# Patient Record
Sex: Male | Born: 1945 | Race: White | Hispanic: No | Smoking: Never smoker
Health system: Southern US, Community
[De-identification: ages and names within clinical notes are randomized; demographics above are authoritative.]

## PROBLEM LIST (undated history)

## (undated) DIAGNOSIS — I214 Non-ST elevation (NSTEMI) myocardial infarction: Secondary | ICD-10-CM

## (undated) DIAGNOSIS — R972 Elevated prostate specific antigen [PSA]: Secondary | ICD-10-CM

## (undated) DIAGNOSIS — N529 Male erectile dysfunction, unspecified: Secondary | ICD-10-CM

## (undated) DIAGNOSIS — F329 Major depressive disorder, single episode, unspecified: Secondary | ICD-10-CM

## (undated) DIAGNOSIS — C189 Malignant neoplasm of colon, unspecified: Secondary | ICD-10-CM

## (undated) DIAGNOSIS — I447 Left bundle-branch block, unspecified: Secondary | ICD-10-CM

## (undated) DIAGNOSIS — E7800 Pure hypercholesterolemia, unspecified: Secondary | ICD-10-CM

## (undated) DIAGNOSIS — G20A1 Parkinson's disease without dyskinesia, without mention of fluctuations: Secondary | ICD-10-CM

## (undated) DIAGNOSIS — M7061 Trochanteric bursitis, right hip: Secondary | ICD-10-CM

## (undated) DIAGNOSIS — M1990 Unspecified osteoarthritis, unspecified site: Secondary | ICD-10-CM

## (undated) DIAGNOSIS — N200 Calculus of kidney: Secondary | ICD-10-CM

## (undated) DIAGNOSIS — E785 Hyperlipidemia, unspecified: Secondary | ICD-10-CM

## (undated) DIAGNOSIS — G459 Transient cerebral ischemic attack, unspecified: Secondary | ICD-10-CM

## (undated) DIAGNOSIS — K219 Gastro-esophageal reflux disease without esophagitis: Secondary | ICD-10-CM

## (undated) DIAGNOSIS — I2510 Atherosclerotic heart disease of native coronary artery without angina pectoris: Secondary | ICD-10-CM

## (undated) DIAGNOSIS — G250 Essential tremor: Secondary | ICD-10-CM

## (undated) DIAGNOSIS — I10 Essential (primary) hypertension: Secondary | ICD-10-CM

## (undated) DIAGNOSIS — Z951 Presence of aortocoronary bypass graft: Secondary | ICD-10-CM

## (undated) DIAGNOSIS — K449 Diaphragmatic hernia without obstruction or gangrene: Secondary | ICD-10-CM

## (undated) DIAGNOSIS — G20 Parkinson's disease: Secondary | ICD-10-CM

## (undated) DIAGNOSIS — Z7982 Long term (current) use of aspirin: Secondary | ICD-10-CM

## (undated) DIAGNOSIS — E8881 Metabolic syndrome: Secondary | ICD-10-CM

## (undated) DIAGNOSIS — R51 Headache: Secondary | ICD-10-CM

## (undated) DIAGNOSIS — S83249A Other tear of medial meniscus, current injury, unspecified knee, initial encounter: Secondary | ICD-10-CM

## (undated) DIAGNOSIS — N400 Enlarged prostate without lower urinary tract symptoms: Secondary | ICD-10-CM

## (undated) HISTORY — DX: Left bundle-branch block, unspecified: I44.7

## (undated) HISTORY — DX: Calculus of kidney: N20.0

## (undated) HISTORY — DX: Presence of aortocoronary bypass graft: Z95.1

## (undated) HISTORY — DX: Elevated prostate specific antigen (PSA): R97.20

## (undated) HISTORY — DX: Transient cerebral ischemic attack, unspecified: G45.9

## (undated) HISTORY — PX: CORONARY ARTERY BYPASS GRAFT: SHX141

## (undated) HISTORY — DX: Essential (primary) hypertension: I10

## (undated) HISTORY — DX: Metabolic syndrome: E88.81

## (undated) HISTORY — PX: COLON SURGERY: SHX602

## (undated) HISTORY — DX: Atherosclerotic heart disease of native coronary artery without angina pectoris: I25.10

## (undated) HISTORY — DX: Long term (current) use of aspirin: Z79.82

## (undated) HISTORY — DX: Unspecified osteoarthritis, unspecified site: M19.90

## (undated) HISTORY — PX: URETHRAL DILATION: SUR417

## (undated) HISTORY — DX: Gastro-esophageal reflux disease without esophagitis: K21.9

## (undated) HISTORY — DX: Parkinson's disease without dyskinesia, without mention of fluctuations: G20.A1

## (undated) HISTORY — DX: Hyperlipidemia, unspecified: E78.5

## (undated) HISTORY — PX: CARDIAC CATHETERIZATION: SHX172

## (undated) HISTORY — DX: Morbid (severe) obesity due to excess calories: E66.01

## (undated) HISTORY — DX: Benign prostatic hyperplasia without lower urinary tract symptoms: N40.0

## (undated) HISTORY — DX: Metabolic syndrome: E88.810

## (undated) HISTORY — DX: Diaphragmatic hernia without obstruction or gangrene: K44.9

## (undated) HISTORY — DX: Headache: R51

## (undated) HISTORY — DX: Pure hypercholesterolemia, unspecified: E78.00

## (undated) HISTORY — DX: Parkinson's disease: G20

## (undated) HISTORY — DX: Trochanteric bursitis, right hip: M70.61

## (undated) HISTORY — DX: Essential tremor: G25.0

## (undated) HISTORY — PX: LITHOTRIPSY: SUR834

## (undated) HISTORY — DX: Non-ST elevation (NSTEMI) myocardial infarction: I21.4

## (undated) HISTORY — DX: Male erectile dysfunction, unspecified: N52.9

## (undated) HISTORY — DX: Malignant neoplasm of colon, unspecified: C18.9

## (undated) HISTORY — DX: Major depressive disorder, single episode, unspecified: F32.9

## (undated) HISTORY — PX: TRANSURETHRAL RESECTION OF PROSTATE: SHX73

---

## 2009-09-02 ENCOUNTER — Encounter: Admission: RE | Admit: 2009-09-02 | Discharge: 2009-09-02 | Payer: Self-pay | Admitting: Surgery

## 2009-12-27 ENCOUNTER — Emergency Department (HOSPITAL_COMMUNITY): Admission: EM | Admit: 2009-12-27 | Discharge: 2009-12-27 | Payer: Self-pay | Admitting: Emergency Medicine

## 2010-05-09 ENCOUNTER — Encounter: Payer: Self-pay | Admitting: Internal Medicine

## 2010-05-09 ENCOUNTER — Observation Stay (HOSPITAL_COMMUNITY): Admission: EM | Admit: 2010-05-09 | Discharge: 2010-05-10 | Payer: Self-pay | Admitting: Emergency Medicine

## 2010-05-09 LAB — CONVERTED CEMR LAB
ALT: 61 units/L
AST: 45 units/L
Albumin: 4 g/dL
Alkaline Phosphatase: 58 units/L
BUN: 14 mg/dL
Bilirubin Urine: NEGATIVE
CO2: 26 meq/L
Calcium: 9.2 mg/dL
Chloride: 105 meq/L
Cholesterol: 174 mg/dL
Creatinine, Ser: 1.08 mg/dL
Glucose, Urine, Semiquant: NEGATIVE
HCT: 45.5 %
HDL: 35 mg/dL
Hemoglobin: 16 g/dL
Hgb urine dipstick: NEGATIVE
Ketones, urine, test strip: 15
LDL Cholesterol: 118 mg/dL
MCV: 91.5 fL
Nitrite: NEGATIVE
Platelets: 182 10*3/uL
Potassium: 3.8 meq/L
Protein, U semiquant: NEGATIVE
RBC: 4.97 M/uL
RDW: 12.6 %
Sodium: 139 meq/L
Specific Gravity, Urine: 1.026
Total Bilirubin: 0.4 mg/dL
Total Protein: 7.1 g/dL
Triglyceride fasting, serum: 107 mg/dL
Urobilinogen, UA: 1
WBC, UA: NEGATIVE cells/hpf
WBC: 9.5 10*3/uL
pH: 6

## 2010-05-10 ENCOUNTER — Ambulatory Visit: Payer: Self-pay | Admitting: Vascular Surgery

## 2010-05-10 ENCOUNTER — Encounter (INDEPENDENT_AMBULATORY_CARE_PROVIDER_SITE_OTHER): Payer: Self-pay | Admitting: Emergency Medicine

## 2010-05-20 ENCOUNTER — Ambulatory Visit: Payer: Self-pay | Admitting: Internal Medicine

## 2010-05-20 DIAGNOSIS — Z8679 Personal history of other diseases of the circulatory system: Secondary | ICD-10-CM

## 2010-05-20 DIAGNOSIS — I119 Hypertensive heart disease without heart failure: Secondary | ICD-10-CM

## 2010-05-20 DIAGNOSIS — I110 Hypertensive heart disease with heart failure: Secondary | ICD-10-CM

## 2010-05-20 DIAGNOSIS — K219 Gastro-esophageal reflux disease without esophagitis: Secondary | ICD-10-CM

## 2010-05-20 DIAGNOSIS — E782 Mixed hyperlipidemia: Secondary | ICD-10-CM | POA: Insufficient documentation

## 2010-05-20 DIAGNOSIS — J3089 Other allergic rhinitis: Secondary | ICD-10-CM | POA: Insufficient documentation

## 2010-05-20 DIAGNOSIS — Z87448 Personal history of other diseases of urinary system: Secondary | ICD-10-CM | POA: Insufficient documentation

## 2010-05-20 DIAGNOSIS — Z87442 Personal history of urinary calculi: Secondary | ICD-10-CM

## 2010-05-20 DIAGNOSIS — E291 Testicular hypofunction: Secondary | ICD-10-CM | POA: Insufficient documentation

## 2010-05-20 HISTORY — DX: Personal history of urinary calculi: Z87.442

## 2010-05-20 HISTORY — DX: Gastro-esophageal reflux disease without esophagitis: K21.9

## 2010-05-20 HISTORY — DX: Hypertensive heart disease with heart failure: I11.0

## 2010-05-20 HISTORY — DX: Mixed hyperlipidemia: E78.2

## 2010-05-20 HISTORY — DX: Personal history of other diseases of the circulatory system: Z86.79

## 2010-05-27 ENCOUNTER — Encounter (INDEPENDENT_AMBULATORY_CARE_PROVIDER_SITE_OTHER): Payer: Self-pay | Admitting: *Deleted

## 2010-06-17 ENCOUNTER — Encounter: Payer: Self-pay | Admitting: Internal Medicine

## 2010-06-23 ENCOUNTER — Encounter (INDEPENDENT_AMBULATORY_CARE_PROVIDER_SITE_OTHER): Payer: Self-pay | Admitting: *Deleted

## 2010-06-28 ENCOUNTER — Telehealth: Payer: Self-pay | Admitting: Internal Medicine

## 2010-06-28 ENCOUNTER — Ambulatory Visit: Payer: Self-pay | Admitting: Internal Medicine

## 2010-07-06 ENCOUNTER — Telehealth: Payer: Self-pay | Admitting: Internal Medicine

## 2010-07-07 ENCOUNTER — Telehealth: Payer: Self-pay | Admitting: Internal Medicine

## 2010-07-08 ENCOUNTER — Ambulatory Visit: Payer: Self-pay | Admitting: Internal Medicine

## 2010-08-31 NOTE — Progress Notes (Signed)
Summary: Wants creme instead.  Phone Note Call from Patient Call back at Home Phone [phone]   Call For: Dr Leone Payor Summary of Call: [facility] says they faxed [place] about patient wanting creme instead of what we ordered and we have not responded. His insurance will only cover creme. Initial call taken by: Leanor Kail [facility],  July 06, 2010 11:40 AM  Follow-up for Phone Call        per pharmacist creme will have $10 copay, foam needs prior auth and will be highest copay.  LM to [facility] at home number  Francee Piccolo CMA Duncan Dull)  July 06, 2010 1:24 PM   Additional Follow-up for Phone Call Additional follow up Details #1::        Pt called back, he wants the creme, you can call him if anymore questions Additional Follow-up by: [place] Johnson,  July 06, 2010 1:29 PM    Additional Follow-up for Phone Call Additional follow up Details #2::    Pt desires to have the cream.  This has been called in for him.  Pt states pharmacy has been calling/faxing [place] since last week.  I advised the pt that his call on 07/06/10 was the first time that we had received word about his rx.  I apologized to Mr. Merrow that it has taken so long to resolve this issue.  I also apologized that I did not return his call from yesterday at 1:29 as I did not know he had returned my call.  Pt did leave a voicemail late yesterday afternoon, but I was away from my desk until after 5 pm and did not get that message until today.  Pt states that he has no confidence in our ability to care for patients if he has this much trouble getting a medication.  Pt states he also has had to call numerous times to schedule appt's and is usually cut off while selecting the appropriate options.  Pt states he will probably cancel his procedure for tomorrow stating he really doesn't want the doctor to be working on him if his office staff can't get medications right. Once again I apologized to the patient for our delay and I  told the patient that I would pass this information on to my superiors so they can check on any issues we may be having with our phones or operators.  Pt states do what you have to do and hung up. Follow-up by: Francee Piccolo CMA Duncan Dull),  July 07, 2010 10:13 AM  New/Updated Medications: PROCTOCREAM HC 2.5 % CREA (HYDROCORTISONE) apply to/into rectum as needed for hemorrhoids Prescriptions: PROCTOCREAM HC 2.5 % CREA (HYDROCORTISONE) apply to/into rectum as needed for hemorrhoids  #1 x 0   Entered by:   Francee Piccolo CMA (AAMA)   Authorized by:   Iva Boop MD, [facility]   Signed by:   Francee Piccolo CMA (AAMA) on 07/07/2010   Method used:   Electronically to        [facility]* (retail)       [address] [place], [place]  [address]       Ph: [phone] or [phone]       Fax: [phone]   RxID:   [id number]

## 2010-08-31 NOTE — Letter (Signed)
Summary: Pre Visit Letter Revised  [facility]  [address] [place], [place] [address]   Phone: [phone]  Fax: [phone]        05/27/2010 MRN: [id number] [facility] [address] [place], [place]  [address]                              Procedure Date: 07/08/2010  Welcome to the Gastroenterology Division at [facility].    You are scheduled to see a nurse for your pre-procedure visit on 06/28/2010 at 9:30 AM on the 3rd floor at [facility], [address]  We ask that you try to arrive at our office 15 minutes prior to your appointment time to allow for check-in.  Please take a minute to review the attached form.  If you answer "Yes" to one or more of the questions on the first page, we ask that you call the person listed at your earliest opportunity.  If you answer "No" to all of the questions, please complete the rest of the form and bring it to your appointment.    Your nurse visit will consist of discussing your medical and surgical history, your immediate family medical history, and your medications.   If you are unable to list all of your medications on the form, please bring the medication bottles to your appointment and we will list them.  We will need to be aware of both prescribed and over the counter drugs.  We will need to know exact dosage information as well.    Please be prepared to read and sign documents such as consent forms, a financial agreement, and acknowledgement forms.  If necessary, and with your consent, a friend or relative is welcome to sit-in on the nurse visit with you.  Please bring your insurance card so that we may make a copy of it.  If your insurance requires a referral to see a specialist, please bring your referral form from your primary care physician.  No co-pay is required for this nurse visit.     If you cannot keep your appointment, please call [phone] to cancel or reschedule prior to your  appointment date.  This allows [place] the opportunity to schedule an appointment for another patient in need of care.    Thank you for choosing [facility] for your medical needs.  We appreciate the opportunity to care for you.  Please visit [place] at our website  to learn more about our practice.  Sincerely, The Gastroenterology Division

## 2010-08-31 NOTE — Letter (Signed)
Summary: Medical Exam Certificate  Medical Exam Certificate   Imported By: Sherian Rein 06/21/2010 11:26:33  _____________________________________________________________________  External Attachment:    Type:   Image     Comment:   External Document

## 2010-08-31 NOTE — Letter (Signed)
Summary: [facility] Instructions  [facility]  [address] [place], [place] [address]   Phone: [phone]  Fax: [phone]       Shawn Meza    May 14, 1946    MRN: [id number]        Procedure Day Dorna Bloom:  Lenor Coffin  07/08/10     Arrival Time:  9:30AM     Procedure Time:  10:30AM     Location of Procedure:                    _ X_  [facility] (4th Floor)                      PREPARATION FOR COLONOSCOPY WITH MOVIPREP   Starting 5 days prior to your procedure 07/03/10 do not eat nuts, seeds, popcorn, corn, beans, peas,  salads, or any raw vegetables.  Do not take any fiber supplements (e.g. Metamucil, Citrucel, and Benefiber).  THE DAY BEFORE YOUR PROCEDURE         DATE: 07/07/10  DAY: WEDNESDAY  1.  Drink clear liquids the entire day-NO SOLID FOOD  2.  Do not drink anything colored red or purple.  Avoid juices with pulp.  No orange juice.  3.  Drink at least 64 oz. (8 glasses) of fluid/clear liquids during the day to prevent dehydration and help the prep work efficiently.  CLEAR LIQUIDS INCLUDE: Water Jello Ice Popsicles Tea (sugar ok, no milk/cream) Powdered fruit flavored drinks Coffee (sugar ok, no milk/cream) Gatorade Juice: apple, white grape, white cranberry  Lemonade Clear bullion, consomm, broth Carbonated beverages (any kind) Strained chicken noodle soup Hard Candy                             4.  In the morning, mix first dose of MoviPrep solution:    Empty 1 Pouch A and 1 Pouch B into the disposable container    Add lukewarm drinking water to the top line of the container. Mix to dissolve    Refrigerate (mixed solution should be used within 24 hrs)  5.  Begin drinking the prep at 5:00 p.m. The MoviPrep container is divided by 4 marks.   Every 15 minutes drink the solution down to the next mark (approximately 8 oz) until the full liter is complete.   6.  Follow completed prep with 16 oz of clear liquid of your choice  (Nothing red or purple).  Continue to drink clear liquids until bedtime.  7.  Before going to bed, mix second dose of MoviPrep solution:    Empty 1 Pouch A and 1 Pouch B into the disposable container    Add lukewarm drinking water to the top line of the container. Mix to dissolve    Refrigerate  THE DAY OF YOUR PROCEDURE      DATE: 07/08/10   DAY: THURSDAY  Beginning at 5:30AM (5 hours before procedure):         1. Every 15 minutes, drink the solution down to the next mark (approx 8 oz) until the full liter is complete.  2. Follow completed prep with 16 oz. of clear liquid of your choice.    3. You may drink clear liquids until 8:30AM (2 HOURS BEFORE PROCEDURE).   MEDICATION INSTRUCTIONS  Unless otherwise instructed, you should take regular prescription medications with a small sip of water   as early as possible the morning of  your procedure.   Additional medication instructions: _         OTHER INSTRUCTIONS  You will need a responsible adult at least 67 years of age to accompany you and drive you home.   This person must remain in the waiting room during your procedure.  Wear loose fitting clothing that is easily removed.  Leave jewelry and other valuables at home.  However, you may wish to bring a book to read or  an iPod/MP3 player to listen to music as you wait for your procedure to start.  Remove all body piercing jewelry and leave at home.  Total time from sign-in until discharge is approximately 2-3 hours.  You should go home directly after your procedure and rest.  You can resume normal activities the  day after your procedure.  The day of your procedure you should not:   Drive   Make legal decisions   Operate machinery   Drink alcohol   Return to work  You will receive specific instructions about eating, activities and medications before you leave.    The above instructions have been reviewed and explained to me by   Clide Cliff,  RN_______________________    I fully understand and can verbalize these instructions _____________________________ Date _________

## 2010-08-31 NOTE — Assessment & Plan Note (Signed)
Summary: New /[employer],requests yearly exam/ / #/cd   Vital Signs:  Patient profile:   65 year old male Height:      67 inches Weight:      222 pounds BMI:     34.90 O2 Sat:      96 % on Room air Temp:     98.0 degrees F oral Pulse rate:   69 / minute Pulse rhythm:   regular Resp:     16 per minute BP sitting:   154 / 80  (left arm) Cuff size:   large  Vitals Entered By: Rock Nephew CMA (May 20, 2010 10:49 AM)  Nutrition Counseling: Patient's BMI is greater than 25 and therefore counseled on weight management options.  O2 Flow:  Room air   Primary Care Provider:  Etta Grandchild MD   History of Present Illness: New to me this gentleman was seen at [facility] 10 days ago for left body numbness that sounds like it was a TIA. He was admiited overnight then discharged on no new meds., he says that he had a physical in the last year with Dr. Clarene Duke in [place] but there are no records available to me today. He has felt well since discharge. 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 His testing showed mild atherosclerosis. He says that he has never had a colonoscopy.  Preventive Screening-Counseling & Management  Alcohol-Tobacco     Smoking Status: never  Caffeine-Diet-Exercise     Does Patient Exercise: no      Drug Use:  no.    -  Date:  05/09/2010    WBC: 9.5    HGB: 16.0    HCT: 45.5    RBC: 4.97    PLT: 182    MCV: 91.5    RDW: 12.6    BUN: 14    Creatinine: 1.08    Sodium: 139    Potassium: 3.8    Chloride: 105    CO2 Total: 26    SGOT (AST): 45    SGPT (ALT): 61    T. Bilirubin: 0.4    Alk Phos: 58    Calcium: 9.2    Cholesterol: 174    Total Protein: 7.1    Albumin: 4.0    LDL: 118    HDL: 35    Triglycerides: 161    Specific Gravity: 1.026    Appearance: clear    Nitrite: neg    PH: 6.0    Protein: neg    Protein: meg    Glucose: neg    Ketones: 15    Urobilinogen: 1.0    Bilirubin: neg    Hgb Urine: neg    WBC  Urine: neg  Current Medications (verified): 1)  Androgel 50 Mg/5gm Gel (Testosterone)  Allergies (verified): No Known Drug Allergies  Past History:  Past Medical History: GERD Headache Hyperlipidemia Hypertension Nephrolithiasis, hx of Transient ischemic attack, hx of Benign prostatic hypertrophy- Dr. Sheppard Penton in [place]  Past Surgical History: Transurethral resection of prostate  Family History: Reviewed history and no changes required. Family History of Arthritis Family History of CAD Male 1st degree relative <50 Family History High cholesterol Family History Hypertension  Social History: Reviewed history and no changes required. Retired Married Never Smoked Alcohol use-no Drug use-no Regular exercise-yes Smoking Status:  never Drug Use:  no Does Patient Exercise:  no  Review of Systems       The patient complains of  weight gain.  The patient denies anorexia, fever, weight loss, chest pain, syncope, dyspnea on exertion, peripheral edema, prolonged cough, headaches, hemoptysis, abdominal pain, melena, hematochezia, severe indigestion/heartburn, hematuria, transient blindness, difficulty walking, and depression.   ENT:  Complains of nasal congestion, postnasal drainage, and ringing in ears; denies decreased hearing, difficulty swallowing, ear discharge, earache, hoarseness, nosebleeds, sinus pressure, and sore throat. Neuro:  Denies brief paralysis, difficulty with concentration, disturbances in coordination, falling down, headaches, inability to speak, memory loss, numbness, poor balance, seizures, sensation of room spinning, tingling, tremors, visual disturbances, and weakness.  Physical Exam  General:  alert, well-developed, well-nourished, well-hydrated, appropriate dress, normal appearance, healthy-appearing, cooperative to examination, good hygiene, and overweight-appearing.   Head:  normocephalic, atraumatic, no abnormalities observed, and no abnormalities  palpated.   Eyes:  No corneal or conjunctival inflammation noted. EOMI. Perrla. Funduscopic exam benign, without hemorrhages, exudates or papilledema. Vision grossly normal. Nose:  no external deformity, no external erythema, no airflow obstruction, no intranasal foreign body, no nasal polyps, no nasal mucosal lesions, no mucosal friability, no active bleeding or clots, no sinus percussion tenderness, no septum abnormalities, nasal dischargemucosal pallor, and mucosal edema.   Mouth:  Oral mucosa and oropharynx without lesions or exudates.  Teeth in good repair. Abdomen:  soft, non-tender, normal bowel sounds, no distention, no masses, no guarding, no rigidity, no rebound tenderness, no inguinal hernia, no hepatomegaly, no splenomegaly, and umbilical hernia.     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 One by mouth once daily for high blood pressure  BP today: 154/80  Labs Reviewed: K+: 3.8 (05/09/2010) Creat: : 1.08 (05/09/2010)   Chol: 174 (05/09/2010)   HDL: 35 (05/09/2010)   LDL: 118 (05/09/2010)   TG: 107 (05/09/2010)  Problem # 5:  HYPERLIPIDEMIA (ICD-272.4) Assessment: New  His updated medication list for this problem includes:    Vytorin 10-20 Mg Tabs (Ezetimibe-simvastatin) ..... One by mouth once daily for cholesterol  Labs Reviewed: SGOT: 45 (05/09/2010)   SGPT: 61 (05/09/2010)   HDL:35 (05/09/2010)  LDL:118 (05/09/2010)  Chol:174 (05/09/2010)  Trig:107 (05/09/2010)  Complete Medication List: 1)   Androgel 50 Mg/5gm Gel (Testosterone) 2)  Vytorin 10-20 Mg Tabs (Ezetimibe-simvastatin) .... One by mouth once daily for cholesterol 3)  Diovan 320 Mg Tabs (Valsartan) .... One by mouth once daily for high blood pressure 4)  Nasonex 50 Mcg/act Susp (Mometasone furoate) .... 2 puffs each nostril once daily  Other Orders: Gastroenterology Referral (GI) Flu Vaccine 51yrs + MEDICARE PATIENTS (Z6109) Administration Flu vaccine - MCR (U0454) Pneumococcal Vaccine ([address]) Admin 1st Vaccine (91478)  Patient Instructions: 1)  Please schedule a follow-up appointment in 1 month. 2)  It is important that you exercise regularly at least 20 minutes 5 times a week. If you develop chest pain, have severe difficulty breathing, or feel very tired , stop exercising immediately and seek medical attention. 3)  You need to lose weight. Consider a lower calorie diet and regular exercise.  4)  Take an Aspirin every day. 5)  Check your Blood Pressure regularly. If  it is above 130/80: you should make an appointment. 6)  Schedule a colonoscopy/sigmoidoscopy to help detect colon cancer. Prescriptions: NASONEX 50 MCG/ACT SUSP (MOMETASONE FUROATE) 2 puffs each nostril once daily  #3 inhs x 0   Entered and Authorized by:   Etta Grandchild MD   Signed by:   Etta Grandchild MD on 05/20/2010   Method used:   Samples Given   RxID:   [id number] VYTORIN 10-20 MG TABS (EZETIMIBE-SIMVASTATIN) One by mouth once daily for cholesterol  #140 x 0   Entered and Authorized by:   Etta Grandchild MD   Signed by:   Etta Grandchild MD on 05/20/2010   Method used:   Samples Given   RxID:   [id number] DIOVAN 320 MG TABS (VALSARTAN) One by mouth once daily for high blood pressure  #112 x 0   Entered and Authorized by:   Etta Grandchild MD   Signed by:   Etta Grandchild MD on 05/20/2010   Method used:   Samples Given   RxID:   [id number] VYTORIN 10-20 MG TABS (EZETIMIBE-SIMVASTATIN) One by mouth once daily for  cholesterol  #140 x 0   Entered and Authorized by:   Etta Grandchild MD   Signed by:   Etta Grandchild MD on 05/20/2010   Method used:   Telephoned to ...         RxID:   [id number]    Orders Added: 1)  Gastroenterology Referral [GI] 2)  Flu Vaccine 56yrs + MEDICARE PATIENTS [Q2039] 3)  Administration Flu vaccine - MCR [G0008] 4)  Pneumococcal Vaccine [90732] 5)  Admin 1st Vaccine [90471] 6)  New Patient Level IV [99204]   Immunizations Administered:  Pneumonia Vaccine:    Vaccine Type: Pneumovax    Site: right deltoid    Mfr: Merck    Dose: 0.5 ml    Route: IM    Given by: Rock Nephew CMA    Exp. Date: 10/18/2011    Lot #: [id number]    VIS given: 07/06/09 version given May 20, 2010.   Immunizations Administered:  Pneumonia Vaccine:    Vaccine Type: Pneumovax    Site: right deltoid    Mfr: Merck    Dose: 0.5 ml    Route: IM    Given by: Rock Nephew CMA    Exp. Date: 10/18/2011    Lot #: [id number]    VIS given: 07/06/09 version given May 20, 2010.  Preventive Care Screening  Last Tetanus Booster:    Date:  08/02/2007    Results:  Historical    [email or web address] Flu Vaccine Consent Questions     Do you have a history of severe allergic reactions to this vaccine? no    Any prior history of allergic reactions to egg and/or gelatin? no    Do you have a sensitivity to the preservative Thimersol? no    Do you have a past history of Guillan-Barre Syndrome? no    Do you currently have an acute febrile illness? no    Have you ever had a severe reaction to latex? no    Vaccine information given and explained to patient? yes    Are you currently pregnant? no    Lot Number:[id number]   Exp Date:01/29/2011   Site Given  Left Deltoid IM

## 2010-08-31 NOTE — Miscellaneous (Signed)
Summary: DIRECT COL.Lennon Alstrom.  Clinical Lists Changes  Medications: Added new medication of MOVIPREP 100 GM  SOLR (PEG-KCL-NACL-NASULF-NA ASC-C) As per prep instructions. - Signed Rx of MOVIPREP 100 GM  SOLR (PEG-KCL-NACL-NASULF-NA ASC-C) As per prep instructions.;  #1 x 0;  Signed;  Entered by: Clide Cliff RN;  Authorized by: Iva Boop MD, FACG;  Method used: Electronically to [facility]*, [address] [place], [place]  [address], Ph: [phone] or [phone], Fax: [phone] Allergies: Added new allergy or adverse reaction of ANDROGEL PUMP Observations: Added new observation of NKA: F (06/28/2010 9:17)    Prescriptions: MOVIPREP 100 GM  SOLR (PEG-KCL-NACL-NASULF-NA ASC-C) As per prep instructions.  #1 x 0   Entered by:   Clide Cliff RN   Authorized by:   Iva Boop MD, [facility]   Signed by:   Clide Cliff RN on 06/28/2010   Method used:   Electronically to        [facility]* (retail)       [address] [place], [place]  [address]       Ph: [phone] or [phone]       Fax: [phone]   RxID:   [id number]

## 2010-08-31 NOTE — Progress Notes (Signed)
Summary: Canceled procedure  Phone Note Call from Patient Call back at Home Phone [phone]   Caller: Patient Call For: Dr. Leone Payor Reason for Call: Talk to Nurse Summary of Call: Pt is canceled procedure for tomorrow...see phone note 07-06-10 Initial call taken by: [place] Johnson,  July 07, 2010 10:10 AM  Follow-up for Phone Call        No charge. Iva Boop MD, [facility]  July 07, 2010 2:01 PM

## 2010-08-31 NOTE — Progress Notes (Signed)
Summary: refill med  Phone Note Call from Patient   Caller: Patient Reason for Call: Refill Medication Summary of Call: Actually, the patient is here for a previsit, and wanted to know if Dr. Leone Payor could refill a script for procotofoam HC.  [place]  It had 12 refills since 08.   Patient states that he really needs this before his prep because he has really bad hemorrhoids.  Pharmacy is in EMR.  Patient has never been seen here, but is scheduled for a colonoscopy on 07/08/10. Initial call taken by: Clide Cliff RN,  June 28, 2010 10:49 AM     Appended Document: refill med Proctofoam Rx sent to pharmacy. Pleae let patient know Iva Boop MD, [facility]  June 30, 2010 11:11 AM  Appended Document: refill med Patient aware

## 2010-10-14 LAB — COMPREHENSIVE METABOLIC PANEL
ALT: 61 U/L — ABNORMAL HIGH (ref 0–53)
AST: 45 U/L — ABNORMAL HIGH (ref 0–37)
Albumin: 4 g/dL (ref 3.5–5.2)
Alkaline Phosphatase: 58 U/L (ref 39–117)
BUN: 14 mg/dL (ref 6–23)
CO2: 26 mEq/L (ref 19–32)
Calcium: 9.2 mg/dL (ref 8.4–10.5)
Chloride: 105 mEq/L (ref 96–112)
Creatinine, Ser: 1.08 mg/dL (ref 0.4–1.5)
GFR calc Af Amer: >60 mL/min (ref >60)
GFR calc non Af Amer: >60 mL/min (ref >60)
Glucose, Bld: 99 mg/dL (ref 70–99)
Potassium: 3.8 mEq/L (ref 3.5–5.1)
Sodium: 139 mEq/L (ref 135–145)
Total Bilirubin: 0.4 mg/dL (ref 0.3–1.2)
Total Protein: 7.1 g/dL (ref 6.0–8.3)

## 2010-10-14 LAB — URINALYSIS, ROUTINE W REFLEX MICROSCOPIC
Bilirubin Urine: NEGATIVE
Glucose, UA: NEGATIVE mg/dL
Hgb urine dipstick: NEGATIVE
Ketones, ur: 15 mg/dL — AB
Nitrite: NEGATIVE
Protein, ur: NEGATIVE mg/dL
Specific Gravity, Urine: 1.026 (ref 1.005–1.030)
Urobilinogen, UA: 1 mg/dL (ref 0.0–1.0)
pH: 6 (ref 5.0–8.0)

## 2010-10-14 LAB — CBC
HCT: 45.5 % (ref 39.0–52.0)
Hemoglobin: 16 g/dL (ref 13.0–17.0)
MCH: 32.2 pg (ref 26.0–34.0)
MCHC: 35.2 g/dL (ref 30.0–36.0)
MCV: 91.5 fL (ref 78.0–100.0)
Platelets: 182 10*3/uL (ref 150–400)
RBC: 4.97 MIL/uL (ref 4.22–5.81)
RDW: 12.6 % (ref 11.5–15.5)
WBC: 9.5 10*3/uL (ref 4.0–10.5)

## 2010-10-14 LAB — LIPID PANEL
Cholesterol: 174 mg/dL (ref 0–200)
HDL: 35 mg/dL — ABNORMAL LOW (ref >39)
LDL Cholesterol: 118 mg/dL — ABNORMAL HIGH (ref 0–99)
Total CHOL/HDL Ratio: 5 RATIO
Triglycerides: 107 mg/dL (ref <150)
VLDL: 21 mg/dL (ref 0–40)

## 2010-10-14 LAB — CK TOTAL AND CKMB (NOT AT ARMC)
CK, MB: 2.8 ng/mL (ref 0.3–4.0)
CK, MB: 2.8 ng/mL (ref 0.3–4.0)
Relative Index: 0.7 (ref 0.0–2.5)
Relative Index: 0.8 (ref 0.0–2.5)
Total CK: 362 U/L — ABNORMAL HIGH (ref 7–232)
Total CK: 428 U/L — ABNORMAL HIGH (ref 7–232)

## 2010-10-14 LAB — TROPONIN I
Troponin I: 0.01 ng/mL (ref 0.00–0.06)
Troponin I: 0.02 ng/mL (ref 0.00–0.06)

## 2010-10-14 LAB — PROTIME-INR
INR: 0.91 (ref 0.00–1.49)
Prothrombin Time: 12.5 seconds (ref 11.6–15.2)

## 2010-10-14 LAB — TSH: TSH: 1.838 u[IU]/mL (ref 0.350–4.500)

## 2010-10-14 LAB — APTT: aPTT: 33 seconds (ref 24–37)

## 2010-10-14 LAB — HEMOGLOBIN A1C
Hgb A1c MFr Bld: 5.9 % — ABNORMAL HIGH (ref <5.7)
Mean Plasma Glucose: 123 mg/dL — ABNORMAL HIGH (ref <117)

## 2010-10-18 LAB — URINALYSIS, ROUTINE W REFLEX MICROSCOPIC
Bilirubin Urine: NEGATIVE
Glucose, UA: NEGATIVE mg/dL
Hgb urine dipstick: NEGATIVE
Ketones, ur: NEGATIVE mg/dL
Nitrite: NEGATIVE
Protein, ur: NEGATIVE mg/dL
Specific Gravity, Urine: 1.026 (ref 1.005–1.030)
Urobilinogen, UA: 0.2 mg/dL (ref 0.0–1.0)
pH: 5 (ref 5.0–8.0)

## 2010-10-18 LAB — CBC
HCT: 46.7 % (ref 39.0–52.0)
Hemoglobin: 16.1 g/dL (ref 13.0–17.0)
MCHC: 34.4 g/dL (ref 30.0–36.0)
MCV: 94.5 fL (ref 78.0–100.0)
Platelets: 170 10*3/uL (ref 150–400)
RBC: 4.94 MIL/uL (ref 4.22–5.81)
RDW: 12.5 % (ref 11.5–15.5)
WBC: 7.6 10*3/uL (ref 4.0–10.5)

## 2010-10-18 LAB — DIFFERENTIAL
Basophils Absolute: 0 10*3/uL (ref 0.0–0.1)
Basophils Relative: 1 % (ref 0–1)
Eosinophils Absolute: 0.3 10*3/uL (ref 0.0–0.7)
Eosinophils Relative: 4 % (ref 0–5)
Lymphocytes Relative: 34 % (ref 12–46)
Lymphs Abs: 2.6 10*3/uL (ref 0.7–4.0)
Monocytes Absolute: 0.5 10*3/uL (ref 0.1–1.0)
Monocytes Relative: 7 % (ref 3–12)
Neutro Abs: 4.1 10*3/uL (ref 1.7–7.7)
Neutrophils Relative %: 54 % (ref 43–77)

## 2010-10-18 LAB — COMPREHENSIVE METABOLIC PANEL
ALT: 42 U/L (ref 0–53)
AST: 31 U/L (ref 0–37)
Albumin: 3.9 g/dL (ref 3.5–5.2)
Alkaline Phosphatase: 57 U/L (ref 39–117)
BUN: 13 mg/dL (ref 6–23)
CO2: 27 mEq/L (ref 19–32)
Calcium: 9.1 mg/dL (ref 8.4–10.5)
Chloride: 109 mEq/L (ref 96–112)
Creatinine, Ser: 0.77 mg/dL (ref 0.4–1.5)
GFR calc Af Amer: >60 mL/min (ref >60)
GFR calc non Af Amer: >60 mL/min (ref >60)
Glucose, Bld: 136 mg/dL — ABNORMAL HIGH (ref 70–99)
Potassium: 3.6 mEq/L (ref 3.5–5.1)
Sodium: 140 mEq/L (ref 135–145)
Total Bilirubin: 0.5 mg/dL (ref 0.3–1.2)
Total Protein: 7.1 g/dL (ref 6.0–8.3)

## 2010-10-18 LAB — POCT CARDIAC MARKERS
CKMB, poc: 1.2 ng/mL (ref 1.0–8.0)
Myoglobin, poc: 61.8 ng/mL (ref 12–200)
Troponin i, poc: <0.05 ng/mL (ref 0.00–0.09)

## 2010-10-18 LAB — LIPASE, BLOOD: Lipase: 29 U/L (ref 11–59)

## 2010-11-26 ENCOUNTER — Other Ambulatory Visit: Payer: Self-pay | Admitting: Gastroenterology

## 2010-11-26 ENCOUNTER — Ambulatory Visit (HOSPITAL_COMMUNITY)
Admission: RE | Admit: 2010-11-26 | Discharge: 2010-11-26 | Disposition: A | Discharge status: Home / Self Care | Payer: Medicare Other | Source: Ambulatory Visit | Attending: Gastroenterology | Admitting: Gastroenterology

## 2010-11-26 DIAGNOSIS — K219 Gastro-esophageal reflux disease without esophagitis: Secondary | ICD-10-CM | POA: Insufficient documentation

## 2010-11-26 DIAGNOSIS — K625 Hemorrhage of anus and rectum: Secondary | ICD-10-CM | POA: Insufficient documentation

## 2010-11-26 DIAGNOSIS — R197 Diarrhea, unspecified: Secondary | ICD-10-CM | POA: Insufficient documentation

## 2010-11-26 DIAGNOSIS — C20 Malignant neoplasm of rectum: Secondary | ICD-10-CM | POA: Insufficient documentation

## 2010-11-26 DIAGNOSIS — I10 Essential (primary) hypertension: Secondary | ICD-10-CM | POA: Insufficient documentation

## 2010-12-16 ENCOUNTER — Encounter: Payer: Self-pay | Admitting: Surgery

## 2010-12-16 ENCOUNTER — Inpatient Hospital Stay (HOSPITAL_COMMUNITY)
Admission: EM | Admit: 2010-12-16 | Discharge: 2010-12-19 | DRG: 249 | Disposition: A | Discharge status: Home / Self Care | Payer: Medicare Other | Source: Ambulatory Visit | Attending: Internal Medicine | Admitting: Internal Medicine

## 2010-12-16 ENCOUNTER — Emergency Department (HOSPITAL_COMMUNITY): Payer: Medicare Other

## 2010-12-16 DIAGNOSIS — I2510 Atherosclerotic heart disease of native coronary artery without angina pectoris: Secondary | ICD-10-CM | POA: Diagnosis present

## 2010-12-16 DIAGNOSIS — I10 Essential (primary) hypertension: Secondary | ICD-10-CM | POA: Diagnosis present

## 2010-12-16 DIAGNOSIS — C189 Malignant neoplasm of colon, unspecified: Secondary | ICD-10-CM | POA: Diagnosis present

## 2010-12-16 DIAGNOSIS — E785 Hyperlipidemia, unspecified: Secondary | ICD-10-CM | POA: Diagnosis present

## 2010-12-16 DIAGNOSIS — Z7982 Long term (current) use of aspirin: Secondary | ICD-10-CM

## 2010-12-16 DIAGNOSIS — R079 Chest pain, unspecified: Secondary | ICD-10-CM

## 2010-12-16 DIAGNOSIS — I214 Non-ST elevation (NSTEMI) myocardial infarction: Principal | ICD-10-CM | POA: Diagnosis present

## 2010-12-16 DIAGNOSIS — Z7902 Long term (current) use of antithrombotics/antiplatelets: Secondary | ICD-10-CM

## 2010-12-16 LAB — DIFFERENTIAL
Basophils Absolute: 0.1 10*3/uL (ref 0.0–0.1)
Basophils Relative: 0 % (ref 0–1)
Eosinophils Absolute: 0.3 10*3/uL (ref 0.0–0.7)
Eosinophils Relative: 3 % (ref 0–5)
Lymphocytes Relative: 36 % (ref 12–46)
Lymphs Abs: 4.1 10*3/uL — ABNORMAL HIGH (ref 0.7–4.0)
Monocytes Absolute: 1.1 10*3/uL — ABNORMAL HIGH (ref 0.1–1.0)
Monocytes Relative: 10 % (ref 3–12)
Neutro Abs: 5.7 10*3/uL (ref 1.7–7.7)
Neutrophils Relative %: 51 % (ref 43–77)

## 2010-12-16 LAB — APTT: aPTT: 32 seconds (ref 24–37)

## 2010-12-16 LAB — CBC
HCT: 45.8 % (ref 39.0–52.0)
Hemoglobin: 16.2 g/dL (ref 13.0–17.0)
MCH: 31.8 pg (ref 26.0–34.0)
MCHC: 35.4 g/dL (ref 30.0–36.0)
MCV: 89.8 fL (ref 78.0–100.0)
Platelets: 184 10*3/uL (ref 150–400)
RBC: 5.1 MIL/uL (ref 4.22–5.81)
RDW: 12.4 % (ref 11.5–15.5)
WBC: 11.2 10*3/uL — ABNORMAL HIGH (ref 4.0–10.5)

## 2010-12-16 LAB — BASIC METABOLIC PANEL
BUN: 16 mg/dL (ref 6–23)
CO2: 24 mEq/L (ref 19–32)
Calcium: 9.8 mg/dL (ref 8.4–10.5)
Chloride: 103 mEq/L (ref 96–112)
Creatinine, Ser: 1.04 mg/dL (ref 0.4–1.5)
GFR calc Af Amer: >60 mL/min (ref >60)
GFR calc non Af Amer: >60 mL/min (ref >60)
Glucose, Bld: 101 mg/dL — ABNORMAL HIGH (ref 70–99)
Potassium: 3.9 mEq/L (ref 3.5–5.1)
Sodium: 140 mEq/L (ref 135–145)

## 2010-12-16 LAB — TROPONIN I
Troponin I: 1.69 ng/mL (ref <0.30)
Troponin I: 1.77 ng/mL (ref <0.30)

## 2010-12-16 LAB — CK TOTAL AND CKMB (NOT AT ARMC)
CK, MB: 17.8 ng/mL (ref 0.3–4.0)
CK, MB: 18.8 ng/mL (ref 0.3–4.0)
Relative Index: 4.5 — ABNORMAL HIGH (ref 0.0–2.5)
Relative Index: 4.7 — ABNORMAL HIGH (ref 0.0–2.5)
Total CK: 394 U/L — ABNORMAL HIGH (ref 7–232)
Total CK: 401 U/L — ABNORMAL HIGH (ref 7–232)

## 2010-12-16 LAB — POCT CARDIAC MARKERS
CKMB, poc: 13.1 ng/mL (ref 1.0–8.0)
Myoglobin, poc: 119 ng/mL (ref 12–200)
Troponin i, poc: 0.46 ng/mL (ref 0.00–0.09)

## 2010-12-16 LAB — PROTIME-INR
INR: 0.96 (ref 0.00–1.49)
Prothrombin Time: 13 seconds (ref 11.6–15.2)

## 2010-12-17 DIAGNOSIS — I2510 Atherosclerotic heart disease of native coronary artery without angina pectoris: Secondary | ICD-10-CM

## 2010-12-17 LAB — HEPARIN LEVEL (UNFRACTIONATED)
Heparin Unfractionated: 0.22 IU/mL — ABNORMAL LOW (ref 0.30–0.70)
Heparin Unfractionated: 0.39 IU/mL (ref 0.30–0.70)

## 2010-12-17 LAB — BASIC METABOLIC PANEL
BUN: 16 mg/dL (ref 6–23)
CO2: 26 mEq/L (ref 19–32)
Calcium: 9.3 mg/dL (ref 8.4–10.5)
Chloride: 105 mEq/L (ref 96–112)
Creatinine, Ser: 0.88 mg/dL (ref 0.4–1.5)
GFR calc Af Amer: >60 mL/min (ref >60)
GFR calc non Af Amer: >60 mL/min (ref >60)
Glucose, Bld: 99 mg/dL (ref 70–99)
Potassium: 3.4 mEq/L — ABNORMAL LOW (ref 3.5–5.1)
Sodium: 141 mEq/L (ref 135–145)

## 2010-12-17 LAB — LIPID PANEL
Cholesterol: 174 mg/dL (ref 0–200)
HDL: 38 mg/dL — ABNORMAL LOW (ref >39)
LDL Cholesterol: 105 mg/dL — ABNORMAL HIGH (ref 0–99)
Total CHOL/HDL Ratio: 4.6 RATIO
Triglycerides: 153 mg/dL — ABNORMAL HIGH (ref <150)
VLDL: 31 mg/dL (ref 0–40)

## 2010-12-17 LAB — CBC
HCT: 42.7 % (ref 39.0–52.0)
Hemoglobin: 14.7 g/dL (ref 13.0–17.0)
MCH: 31.1 pg (ref 26.0–34.0)
MCHC: 34.4 g/dL (ref 30.0–36.0)
MCV: 90.3 fL (ref 78.0–100.0)
Platelets: 166 10*3/uL (ref 150–400)
RBC: 4.73 MIL/uL (ref 4.22–5.81)
RDW: 12.5 % (ref 11.5–15.5)
WBC: 11.1 10*3/uL — ABNORMAL HIGH (ref 4.0–10.5)

## 2010-12-17 LAB — CARDIAC PANEL(CRET KIN+CKTOT+MB+TROPI)
CK, MB: 27.5 ng/mL (ref 0.3–4.0)
CK, MB: 29.1 ng/mL (ref 0.3–4.0)
Relative Index: 6.1 — ABNORMAL HIGH (ref 0.0–2.5)
Relative Index: 5.8 — ABNORMAL HIGH (ref 0.0–2.5)
Total CK: 452 U/L — ABNORMAL HIGH (ref 7–232)
Total CK: 504 U/L — ABNORMAL HIGH (ref 7–232)
Troponin I: 3.28 ng/mL (ref <0.30)
Troponin I: 3.47 ng/mL (ref <0.30)

## 2010-12-17 LAB — POCT ACTIVATED CLOTTING TIME
Activated Clotting Time: 199 seconds
Activated Clotting Time: 535 seconds

## 2010-12-17 LAB — MRSA PCR SCREENING: MRSA by PCR: NEGATIVE

## 2010-12-18 DIAGNOSIS — I214 Non-ST elevation (NSTEMI) myocardial infarction: Secondary | ICD-10-CM

## 2010-12-18 LAB — CBC
HCT: 41.1 % (ref 39.0–52.0)
Hemoglobin: 14 g/dL (ref 13.0–17.0)
MCH: 31.3 pg (ref 26.0–34.0)
MCHC: 34.1 g/dL (ref 30.0–36.0)
MCV: 91.7 fL (ref 78.0–100.0)
Platelets: 176 10*3/uL (ref 150–400)
RBC: 4.48 MIL/uL (ref 4.22–5.81)
RDW: 12.8 % (ref 11.5–15.5)
WBC: 9.7 10*3/uL (ref 4.0–10.5)

## 2010-12-18 LAB — BASIC METABOLIC PANEL
BUN: 13 mg/dL (ref 6–23)
CO2: 27 mEq/L (ref 19–32)
Calcium: 9.5 mg/dL (ref 8.4–10.5)
Chloride: 104 mEq/L (ref 96–112)
Creatinine, Ser: 0.94 mg/dL (ref 0.4–1.5)
GFR calc Af Amer: >60 mL/min (ref >60)
GFR calc non Af Amer: >60 mL/min (ref >60)
Glucose, Bld: 116 mg/dL — ABNORMAL HIGH (ref 70–99)
Potassium: 4 mEq/L (ref 3.5–5.1)
Sodium: 141 mEq/L (ref 135–145)

## 2010-12-18 LAB — CARDIAC PANEL(CRET KIN+CKTOT+MB+TROPI)
CK, MB: 10.9 ng/mL (ref 0.3–4.0)
Relative Index: 2.9 — ABNORMAL HIGH (ref 0.0–2.5)
Total CK: 371 U/L — ABNORMAL HIGH (ref 7–232)
Troponin I: 3.81 ng/mL (ref <0.30)

## 2010-12-18 LAB — HEPARIN LEVEL (UNFRACTIONATED): Heparin Unfractionated: <0.1 IU/mL — ABNORMAL LOW (ref 0.30–0.70)

## 2010-12-19 LAB — CBC
HCT: 40.2 % (ref 39.0–52.0)
Hemoglobin: 13.6 g/dL (ref 13.0–17.0)
MCH: 31.1 pg (ref 26.0–34.0)
MCHC: 33.8 g/dL (ref 30.0–36.0)
MCV: 91.8 fL (ref 78.0–100.0)
Platelets: 168 10*3/uL (ref 150–400)
RBC: 4.38 MIL/uL (ref 4.22–5.81)
RDW: 12.6 % (ref 11.5–15.5)
WBC: 10.6 10*3/uL — ABNORMAL HIGH (ref 4.0–10.5)

## 2010-12-19 LAB — BASIC METABOLIC PANEL
BUN: 17 mg/dL (ref 6–23)
CO2: 27 mEq/L (ref 19–32)
Calcium: 9.2 mg/dL (ref 8.4–10.5)
Chloride: 102 mEq/L (ref 96–112)
Creatinine, Ser: 0.92 mg/dL (ref 0.4–1.5)
GFR calc Af Amer: >60 mL/min (ref >60)
GFR calc non Af Amer: >60 mL/min (ref >60)
Glucose, Bld: 100 mg/dL — ABNORMAL HIGH (ref 70–99)
Potassium: 3.6 mEq/L (ref 3.5–5.1)
Sodium: 138 mEq/L (ref 135–145)

## 2010-12-19 LAB — HEPARIN LEVEL (UNFRACTIONATED): Heparin Unfractionated: <0.1 IU/mL — ABNORMAL LOW (ref 0.30–0.70)

## 2010-12-19 NOTE — Cardiovascular Report (Signed)
NAME:  Shawn Meza, Shawn Meza NO.:  [id number]  MEDICAL RECORD NO.:  [id number]           PATIENT TYPE:  I  LOCATION:  2928                         FACILITY:  [facility]  PHYSICIAN:  Verne Carrow, MDDATE OF BIRTH:  1945-12-24  DATE OF PROCEDURE:  12/17/2010 DATE OF DISCHARGE:                           CARDIAC CATHETERIZATION   PRIMARY CARDIOLOGIST:  Rollene Rotunda, MD, [facility]  PROCEDURE PERFORMED:  PTCA with placement of bare-metal stent in the proximal circumflex coronary artery.  OPERATOR:  Verne Carrow, MD  INDICATIONS:  This is a 65 year old Caucasian male with a history of recently diagnosed colon cancer and hypertension who was admitted to the hospital with complaints of chest pain.  He ruled in for myocardial infarction with serial cardiac enzymes.  He underwent diagnostic catheterization earlier today by Dr. Arvilla Meres.  The patient was found to have diffuse triple-vessel coronary artery disease.  His left anterior descending artery and distal right coronary artery were small- caliber and were not felt to be suitable for bypass grafting.  He also had discrete lesion in the circumflex artery that was felt to be amenable for percutaneous intervention.  Dr. Gala Romney asked me to assist with intervention.  PROCEDURE IN DETAIL:  When I entered the case, the patient was supine on the table.  He had been prepped and draped prior to his diagnostic procedure.  The diagnostic procedure was performed initially through the right radial artery.  However, it was difficult to engage the left main artery from the right radial artery, so this access was aborted.  A Terumo hemostasis band was present on the right radial artery.  A 5- French sheath was present in the right femoral artery.  The left main artery had been selectively engaged from the right femoral artery approach.  When I entered the case, I would change his sheath out for a 6-French sheath in  the right femoral artery.  The patient was given a bolus of Angiomax and a drip was started.  The patient was loaded with 600 mg of Plavix x1.  I then engaged the left main artery with an XB 3.0 guiding catheter.  When the ACT was greater than 200, we passed a Cougar intracoronary wire down the length of circumflex artery into the first obtuse marginal branch.  A 2.25 x 8-mm balloon was inflated in the area of tight stenosis.  A 2.75 x 12-mm Vision bare-metal stent was carefully positioned in the area of tight stenosis and was deployed without difficulty.  A 2.75 x 8-mm noncompliant balloon was carefully positioned inside the stented segment and was inflated to 16 atmospheres.  There was an excellent angiographic result.  The stenosis was taken from 99% down to 0%.  There was excellent flow into the distal vessel.  The patient had no immediate complications.  The arteriotomy in the right femoral artery was not suitable for closure device.  The patient was taken off the table and taken to the recovery area in stable condition.  IMPRESSION:  Successful percutaneous transluminal coronary angioplasty with placement of a bare-metal stent in the circumflex artery in the proximal to midportion.  RECOMMENDATIONS:  The patient should be continued on aspirin and Plavix for at least 1 month.  At this time, it should be safe to stop his dual- antiplatelet therapy for his planned colon cancer surgery.  We will continue his beta-blocker, statin and ACE inhibitor.  He will be watched closely tonight and discharged home tomorrow.     Verne Carrow, MD     CM/MEDQ  D:  12/17/2010  T:  12/18/2010  Job:  [id number]  cc:   Rollene Rotunda, MD, [facility]  Electronically Signed by Verne Carrow MD on 12/19/2010 09:57:57 PM

## 2010-12-22 ENCOUNTER — Telehealth: Payer: Self-pay | Admitting: Cardiovascular Disease

## 2010-12-22 DIAGNOSIS — F419 Anxiety disorder, unspecified: Secondary | ICD-10-CM

## 2010-12-22 MED ORDER — DIAZEPAM 5 MG PO TABS: 5.0000 mg | ORAL_TABLET | Freq: Four times a day (QID) | ORAL | Status: AC | PRN

## 2010-12-22 NOTE — Telephone Encounter (Signed)
We can call in Valium 5 mg po Q6 hours prn anxiety, #15. Can we tell him that he will need to get his PCP to fill this next time? Thanks, chris

## 2010-12-22 NOTE — Telephone Encounter (Signed)
Pt had stent put in over the weekend and needs something for anxiety.  PCP is on vacation.  Can we call anything in for him.  PHV not made yet for Dr. Clifton James.

## 2010-12-22 NOTE — Telephone Encounter (Signed)
Will discuss with Dr. Clifton James

## 2010-12-22 NOTE — Telephone Encounter (Signed)
Will fax to pharmacy-[facility].

## 2010-12-22 NOTE — Cardiovascular Report (Signed)
NAME:  Shawn Meza, Shawn Meza NO.:  [id number]  MEDICAL RECORD NO.:  [id number]           PATIENT TYPE:  LOCATION:                                 FACILITY:  PHYSICIAN:  Bevelyn Buckles. Christell Steinmiller, MDDATE OF BIRTH:  03-11-1946  DATE OF PROCEDURE: DATE OF DISCHARGE:                           CARDIAC CATHETERIZATION   IDENTIFICATION:  Shawn Meza is a 65 year old male with a history of hypertension.  He was recently diagnosed with a cancerous colon polyp and he is pending resection.  However, he over the past week or two has had exertional chest pain.  He was admitted and ruled in for a non-ST elevation myocardial infarction.  He was referred for cardiac catheterization.  PROCEDURES PERFORMED: 1. Selective coronary angiography. 2. Left heart cath. 3. Left ventriculogram.  DESCRIPTION OF PROCEDURE:  The risks and indication were explained. Consent was signed, placed on the chart after a normal Allen test was confirmed in the right arm.  The right wrist area was prepped and draped in routine sterile fashion, anesthetized with 1% local lidocaine.  We placed a 5-French sheath using a modified Seldinger technique.  Intra- arterial verapamil 3 mg were given as well as 4500 units of systemic heparin.  The right subclavian anatomy was quite tortuous.  After multiple attempts, we were finally able to cannulate the right coronary artery with a TIG catheter.  However, we were unable to successfully see the left coronary catheter in the root.  We had multiple attempts but kept losing the wire access.  We thus decided to access the right femoral artery.  Area was anesthetized with 1% local lidocaine and 5- French sheath was placed there as well.  We imaged the left coronary system with a JL-4 and then did left heart cath with a pigtail catheter. There were no apparent complications.  Central aortic pressure was 109/69 with a mean of 88.  LV pressure 107/8 with EDP of 13.  There was no  aortic stenosis.  Left main had an ostial 20% lesion.  The LAD was diffusely and severely diseased.  It gave off a heavily diseased diagonal as well.  In the ostium of the LAD, there was a 60% lesion.  In the proximal throughout the midsection, there was tubular 80% lesion.  In the midsection, there was a tubular 70% lesion.  The distal apical LAD had diffuse 95% plaquing.  There was a 70% lesion in the ostium of the diagonal with diffuse disease throughout.  Left circumflex gave off a ramus branch and two OM branches.  The ostium of the ramus branch was diffuse 80% to 90% stenosis.  In the proximal left circ, there is a 30% stenosis followed by a 99% stenosis in the midsection.  In first OM, there is a 40% lesion proximal and a 40% lesion mid.  Right coronary artery was a dominant vessel, had a 30% lesion proximally, a 50% lesion in the midsection.  PDA had a 40% lesion osteally and posterolateral was small, diffusely diseased vessel with 95% stenosis throughout.  Left ventriculogram done in the RAO position showed an EF of 55% with no  definitive wall motion abnormalities.  ASSESSMENT: 1. Three-vessel coronary artery disease. 2. Culprit artery appears to be the mid left circumflex. 3. Diffuse and severe left anterior descending disease which is     nongraftable. 4. Normal left ventricular function.  PLAN/DISCUSSION:  Shawn Meza has severe three-vessel coronary artery disease.  However, with the exception of the circumflex, this is mostly a severe distal disease.  His LAD is really not a candidate for grafting.  Thus given his need for surgery for his cancerous colon polyp, I think the best plan is to stent the left circumflex with a bare- metal stent to treat the remainder of his coronary disease medically.     Bevelyn Buckles. Kamira Mellette, MD     DRB/MEDQ  D:  12/17/2010  T:  12/18/2010  Job:  [id number]  cc:   Anselmo Rod, MD, [facility]  Electronically Signed by Arvilla Meres MD on 12/22/2010 09:42:12 PM

## 2010-12-23 NOTE — H&P (Addendum)
NAME:  Shawn Meza, Shawn Meza NO.:  [id number]  MEDICAL RECORD NO.:  [id number]           PATIENT TYPE:  E  LOCATION:  [facility]                         FACILITY:  [facility]  PHYSICIAN:  Doylene Canning. Ladona Ridgel, MD    DATE OF BIRTH:  January 01, 1946  DATE OF ADMISSION:  12/16/2010 DATE OF DISCHARGE:                             HISTORY & PHYSICAL   CHIEF COMPLAINT:  Chest pain.  HISTORY OF PRESENT ILLNESS:  The patient is a 65 year old white male with past medical history significant for recent diagnosis of colon cancer who is presenting with a week history of exertional chest discomfort.  The patient states approximately 2 weeks ago, he had a routine colonoscopy, at which time the pathology for a large polyp which was removed, indicated colon cancer.  Per the patient, his gastroenterologist believed this is localized and that he likely will not need colectomy.  The patient states for the past week he has been having intermittent chest discomfort.  He states that it is seems to always be little bit present, however, it is clearly worsened with exertion.  He states the discomfort radiates to his right arm and is always associated with shortness of breath.  He denies any previous history of chest discomfort prior to this past week.  He currently has very mild chest discomfort.  PAST MEDICAL HISTORY: 1. Mild hypertension that has been untreated. 2. Recent diagnosis of colon cancer.  SOCIAL HISTORY:  No tobacco, no alcohol.  FAMILY HISTORY:  Negative for premature coronary disease.  ALLERGIES:  No known drug allergies.  MEDICATIONS:  None.  REVIEW OF SYSTEMS:  Negative for hematochezia, melena.  Positive for 1 episode of dizziness approximately 1 month ago.  He also complains of some abdominal discomfort from an umbilical hernia.  Other systems as in HPI, otherwise negative.  PHYSICAL EXAMINATION:  VITAL SIGNS:  Temperature 98.3, blood pressure 166/88, heart rate 89, respiratory  rate 18, satting 95% on room air. GENERAL:  No acute distress. HEENT:  Normocephalic, atraumatic. NECK:  Supple.  There is no JVD. HEART:  Regular rate and rhythm without murmur, rub, or gallop. LUNGS:  Clear bilaterally. ABDOMEN:  Soft, mild diffuse tenderness.  No rebound or guarding. EXTREMITIES:  Without edema. SKIN:  Warm and dry. PSYCHIATRIC:  The patient is appropriate. MUSCULOSKELETAL:  5/5 bilateral upper and lower extremities strength. NEUROLOGIC: Grossly nonfocal.  LABS:  Sodium 140, potassium 3.9, chloride 103, CO2 24, BUN 16, creatinine 1.0, glucose 101.  White count 11, hemoglobin 16, hematocrit 46, platelet count 184.  CK-MB 13, troponin 0.46.  Chest x-ray is within normal limits.  EKG taken today independently interpreted by myself demonstrates normal sinus rhythm with very poor R-wave progression and a left anterior fascicular block.  ASSESSMENT: 1. Sinus symptoms concerning for non-ST-segment elevation myocardial     infarction. 2. Uncontrolled hypertension. 3. Recent diagnosis of colon cancer.  PLAN:  The patient will be admitted to Step-Down Unit.  Cardiac markers will be followed.  He was placed on aspirin 162 mg daily, IV heparin, IV nitroglycerin, beta-blocker, statin, and ACE inhibitor.  His stools will be heme checked  and there will be a low threshold to stop the heparin if there is any evidence of GI bleeding, considering his colon biopsy that he had approximately 2 weeks ago.  He will be made n.p.o. after midnight.  A left heart catheterization will likely be in order.  It appears per the patient that his colon cancer is or has already been removed via endoscopy.  Per him, there does not appear to be any immediate or perhaps even long term need for colectomy.  However, if left heart catheterization is performed and intervention has required a bare-metal stent, would be recommended to limit the need for dual antiplatelet therapy.     Doylene Canning.  Ladona Ridgel, MD   ______________________________ Doylene Canning. Ladona Ridgel, MD    GWT/MEDQ  D:  12/16/2010  T:  12/16/2010  Job:  [id number]  Electronically Signed by Lewayne Bunting MD on 02/17/2011 08:27:53 AM

## 2010-12-24 ENCOUNTER — Encounter: Payer: Self-pay | Admitting: Cardiovascular Disease

## 2010-12-24 NOTE — Discharge Summary (Signed)
NAME:  Shawn Meza, Shawn Meza NO.:  [id number]  MEDICAL RECORD NO.:  [id number]           PATIENT TYPE:  I  LOCATION:  3738                         FACILITY:  [facility]  PHYSICIAN:  Hillis Range, MD       DATE OF BIRTH:  1945/10/23  DATE OF ADMISSION:  12/16/2010 DATE OF DISCHARGE:  12/19/2010                              DISCHARGE SUMMARY   REASON FOR ADMISSION:  Non-ST-elevation myocardial infarction.  DISCHARGE DIAGNOSES: 1. Coronary artery disease.     a.     Status post bare-metal stent to the circumflex this      admission.     b.     Preserved left ventricular function. 2. Colon cancer, recently diagnosed. 3. Hypertension. 4. Dyslipidemia.  PROCEDURES PERFORMED THIS ADMISSION: 1. Cardiac catheterization by Dr. Arvilla Meres Dec 17, 2010:  Left     main ostial 20%, LAD diffusely diseased - ostial 60%, proximal -     mid 80%, mid 70%, distal apical 95%; ostial diagonal 70%; ostial     ramus 80%-90%, proximal circumflex 30% followed by 99% (culprit);     obtuse marginal 140% proximal and 40% mid; RCA 30% proximal, 50%     mid; PDA ostial 40%; posterolateral small with diffuse 95%     throughout; EF 55% without wall motion abnormality. 2. PCI by Dr. Earney Hamburg Dec 17, 2010:  PTCA and stenting with a     bare-metal stent to the proximal to mid circumflex (2.75 x 12 mm     Vision bare-metal stent).  CARDIOLOGIST:  Verne Carrow, MD  PRIMARY CARE PHYSICIAN:  Dr. Beatriz Chancellor.  ADMISSION HISTORY:  Shawn Meza is a 65 year old male who was recently diagnosed with colon cancer who presented to the emergency room with 1 week of exertional chest pressure.  In the emergency room, his cardiac markers were noted to be elevated with a troponin of 0.46.  EKG demonstrated poor wave progression and left anterior fascicular block.  He was admitted for further evaluation and treatment.  HOSPITAL COURSE:  The patient was admitted and underwent cardiac catheterization  as outlined above.  He had severe diffuse 3-vessel CAD. His LAD was not felt to be graftable with bypass surgery.  Most of his disease was noted to be distal.  His circumflex lesion was felt to be the culprit and this was treated with a bare-metal stent by Dr. Clifton Aiyla Baucom as outlined above.  The patient tolerated the procedure well and had no immediate complications.  He was observed over the next 48 hours and on the morning of discharge was doing well without complaints of chest pain.  Dr. Johney Frame evaluated the patient and felt he was stable enough for discharge to home.  The patient had a bare-metal stent placed.  As noted, he had recent colon cancer diagnosed and he may have plans for a future procedure regarding his colon cancer.  Therefore, Plavix plus aspirin should be continued for greater than or equal to 1 month.  His residual CAD is currently to be treated medically.  His Nexium was changed to Protonix at discharge due to the initiation  of Plavix and recent PCI.  Lisinopril, metoprolol, and Crestor were all added to his medications this admission.  LABORATORY AND ANCILLARY DATA:  White count 10600, hemoglobin 13.6, hematocrit 40.2, platelet count 168,000, sodium 141, potassium 4, glucose 116, creatinine 0.94.  Peak troponin 3.81, CK-MB peak 29.1, total cholesterol 174, triglycerides 153, HDL 38, LDL 105.  Chest x-ray, no active disease.  ALLERGIES:  No known drug allergies.  DISCHARGE MEDICATIONS: 1. Aspirin 81 mg daily. 2. Plavix 75 mg daily. 3. Lisinopril 10 mg daily. 4. Metoprolol 25 mg b.i.d. 5. Nitroglycerin p.r.n. chest pain. 6. Protonix 40 mg twice daily. 7. Crestor 40 mg daily. 8. He has been advised to stop taking his ibuprofen and Nexium.  ACTIVITIES:  He is to increase activity slowly.  He may shower.  No lifting or sexual activity for 2 weeks.  No driving for 1 week.  DIET:  Low-fat, low-sodium diet.  WOUND CARE:  He is to call our office for any swelling,  bleeding, bruising, or fever.  Followup will be with Dr. Clifton Finch Costanzo or the physician assistant in 2 weeks and the office will contact him with an appointment.  Total physician and PA time greater than 30 minutes at discharge.     Tereso Newcomer, PA-C   ______________________________ Hillis Range, MD    SW/MEDQ  D:  12/19/2010  T:  12/19/2010  Job:  [id number]  cc:   Dr. Beatriz Chancellor  Electronically Signed by Tereso Newcomer PA-C on 12/19/2010 04:06:45 PM Electronically Signed by Hillis Range MD on 12/24/2010 05:55:04 PM

## 2010-12-28 ENCOUNTER — Telehealth: Payer: Self-pay | Admitting: Cardiovascular Disease

## 2010-12-28 ENCOUNTER — Encounter: Payer: Self-pay | Admitting: Cardiovascular Disease

## 2010-12-28 ENCOUNTER — Ambulatory Visit (INDEPENDENT_AMBULATORY_CARE_PROVIDER_SITE_OTHER): Payer: Medicare Other | Admitting: Cardiovascular Disease

## 2010-12-28 VITALS — BP 142/80 | HR 65 | Resp 18 | Ht 66.0 in | Wt 217.0 lb

## 2010-12-28 DIAGNOSIS — I2510 Atherosclerotic heart disease of native coronary artery without angina pectoris: Secondary | ICD-10-CM

## 2010-12-28 DIAGNOSIS — I25119 Atherosclerotic heart disease of native coronary artery with unspecified angina pectoris: Secondary | ICD-10-CM

## 2010-12-28 HISTORY — DX: Atherosclerotic heart disease of native coronary artery with unspecified angina pectoris: I25.119

## 2010-12-28 NOTE — Assessment & Plan Note (Addendum)
Stable. Will continue ASA, Plavix for at least one month. He has a bare metal stent in the Circumflex. He also has disease in his LAD, Ramus and PDA but this will be treated medically for now. He can proceed with his planned surgical resection without further cardiac workup. His Plavix can be held after June 16th if necessary for the surgery. Will forward to Dr. Michaell Cowing.

## 2010-12-28 NOTE — Telephone Encounter (Signed)
Pt rtn your call

## 2010-12-28 NOTE — Patient Instructions (Signed)
Your physician recommends that you schedule a follow-up appointment in: 3 months with Dr. Clifton James

## 2010-12-28 NOTE — Progress Notes (Signed)
History of Present Illness: 65 yo WM with history of CAD with recent admission to [facility] with a NSTEMI , now s/p placement of a bare metal stent in the proximal Circumflex artery. He has also been recently diagnosed with colon cancer. His cardiac catheterization was performed by Dr. Gala Romney. He was found to have severe disease in the proximal Circumflex artery as well as his LAD, Ramus and PDA. LV function was normal by LV gram. (full cath report below). The circumflex lesion was felt to be the culprit lesion.  We elected to place a bare metal stent in the Circumflex artery so that he could have one month of dual antiplatelet therapy before his colon surgery. He also has a history of HTN.  He is here today for hospital follow up. He still feels like he has some shortness of breath. No chest pain. Overall doing well. He has plans for colon cancer resection per Dr. Michaell Cowing this summer. He is not yet sure when this will be done. He has been taking all medications without difficulty.   Past Medical History  Diagnosis Date  . GERD (gastroesophageal reflux disease)   . Headache   . Hyperlipidemia   . Hypertension   . Nephrolithiasis     hx of  . Transient ischemic attack     hx of  . BPH (benign prostatic hypertrophy)   . CAD (coronary artery disease)   . Colon cancer     Past Surgical History  Procedure Date  . Transurethral resection of prostate     Current Outpatient Prescriptions  Medication Sig Dispense Refill  . aspirin 81 MG tablet Take 81 mg by mouth daily.        . clopidogrel (PLAVIX) 75 MG tablet Take 75 mg by mouth daily.        . diazepam (VALIUM) 5 MG tablet Take 1 tablet (5 mg total) by mouth every 6 (six) hours as needed for anxiety.  15 tablet  0  . lisinopril (PRINIVIL,ZESTRIL) 10 MG tablet Take 10 mg by mouth daily.        . metoprolol tartrate (LOPRESSOR) 25 MG tablet Take 25 mg by mouth 2 (two) times daily.        . mometasone (NASONEX) 50 MCG/ACT nasal  spray 2 sprays by Nasal route daily.        . nitroGLYCERIN (NITROSTAT) 0.4 MG SL tablet Place 0.4 mg under the tongue every 5 (five) minutes as needed.        . pantoprazole (PROTONIX) 40 MG tablet Take 40 mg by mouth 2 (two) times daily.        . rosuvastatin (CRESTOR) 20 MG tablet Take 20 mg by mouth daily.        Marland Kitchen DISCONTD: ezetimibe-simvastatin (VYTORIN) 10-20 MG per tablet Take 1 tablet by mouth at bedtime.        Marland Kitchen DISCONTD: hydrocortisone (PROCTOCREAM-HC) 2.5 % rectal cream Place rectally as needed.        Marland Kitchen DISCONTD: testosterone (ANDROGEL) 50 MG/5GM GEL Place 5 g onto the skin daily.        Marland Kitchen DISCONTD: valsartan (DIOVAN) 320 MG tablet Take 320 mg by mouth daily.          Allergies  Allergen Reactions  . Testosterone     REACTION: abd pain    History   Social History  . Marital Status: Married    Spouse Name: N/A    Number of Children: N/A  . Years of  Education: N/A   Occupational History  . retired    Social History Main Topics  . Smoking status: Never Smoker   . Smokeless tobacco: Not on file  . Alcohol Use: No  . Drug Use: No  . Sexually Active: Not on file   Other Topics Concern  . Not on file   Social History Narrative  . No narrative on file    Family History  Problem Relation Age of Onset  . Coronary artery disease Other     family hx of male 1st degree relative ,28  . Hyperlipidemia      family hx of  . Hypertension      family hx of  . Arthritis      family hx of    Review of Systems:  As stated in the HPI and otherwise negative.   BP 142/80  Pulse 80  Resp 18  Ht 5\' 6"  (1.676 m)  Wt 217 lb (98.431 kg)  BMI 35.02 kg/m2  Physical Examination: General: Well developed, well nourished, NAD HEENT: OP clear, mucus membranes moist SKIN: warm, dry. No rashes. Neuro: No focal deficits Musculoskeletal: Muscle strength 5/5 all ext Psychiatric: Mood and affect normal Neck: No JVD, no carotid bruits, no thyromegaly, no  lymphadenopathy. Lungs:Clear bilaterally, no wheezes, rhonci, crackles Cardiovascular: Regular rate and rhythm. No murmurs, gallops or rubs. Abdomen:Soft. Bowel sounds present. Non-tender.  Extremities: No lower extremity edema. Pulses are 2 + in the bilateral DP/PT.  Cardiac Cath 12/17/10:   Left main had an ostial 20% lesion.      The LAD was diffusely and severely diseased.  It gave off a heavily   diseased diagonal as well.  In the ostium of the LAD, there was a 60%   lesion.  In the proximal throughout the midsection, there was tubular   80% lesion.  In the midsection, there was a tubular 70% lesion.  The   distal apical LAD had diffuse 95% plaquing.  There was a 70% lesion in   the ostium of the diagonal with diffuse disease throughout.      Left circumflex gave off a ramus branch and two OM branches.  The ostium   of the ramus branch was diffuse 80% to 90% stenosis.  In the proximal   left circ, there is a 30% stenosis followed by a 99% stenosis in the   midsection.  In first OM, there is a 40% lesion proximal and a 40%   lesion mid.      Right coronary artery was a dominant vessel, had a 30% lesion   proximally, a 50% lesion in the midsection.  PDA had a 40% lesion   osteally and posterolateral was small, diffusely diseased vessel with   95% stenosis throughout.      Left ventriculogram done in the RAO position showed an EF of 55% with no   definitive wall motion abnormalities.

## 2010-12-28 NOTE — Telephone Encounter (Signed)
Mailed patient his instructions from today's office visit.

## 2010-12-29 ENCOUNTER — Institutional Professional Consult (permissible substitution): Payer: Medicare Other | Admitting: Cardiology

## 2010-12-31 ENCOUNTER — Institutional Professional Consult (permissible substitution): Payer: Medicare Other | Admitting: Cardiology

## 2011-01-05 ENCOUNTER — Telehealth: Payer: Self-pay | Admitting: Internal Medicine

## 2011-01-05 NOTE — Telephone Encounter (Signed)
[name], Please call dr Viviann Spare gross at [facility]--he would like to talk with you about mutual pt-- Shawn Meza--MR#--[id number]--THANKS NT

## 2011-01-05 NOTE — Telephone Encounter (Signed)
Dr Karie Soda would like to speak with Dr Gala Romney re pt. Dr Karie Soda is aware Dr Gala Romney is not in the office today. Dr Michaell Cowing nurse states to leave a message for the Dr. Gala Romney.

## 2011-01-10 NOTE — Telephone Encounter (Signed)
I discussed with Dr. Michaell Cowing. Ok to proceed.

## 2011-01-12 NOTE — Telephone Encounter (Signed)
Pt wanted to know if he was suppose to hold his plavix, advised we had given ok to hold it after 6/16 if needed, he has not heard from them about sch, advised when they sch it ask if they need him off and for how long, he is agreeable

## 2011-01-12 NOTE — Telephone Encounter (Signed)
Has question regarding medication

## 2011-01-19 ENCOUNTER — Telehealth: Payer: Self-pay | Admitting: Cardiovascular Disease

## 2011-01-19 NOTE — Telephone Encounter (Signed)
Faxed LOV & EKG to [facility] ([phone]).

## 2011-01-27 ENCOUNTER — Encounter (HOSPITAL_COMMUNITY): Payer: Medicare Other

## 2011-01-27 ENCOUNTER — Other Ambulatory Visit (INDEPENDENT_AMBULATORY_CARE_PROVIDER_SITE_OTHER): Payer: Self-pay | Admitting: Surgery

## 2011-01-27 LAB — CBC
HCT: 44 % (ref 39.0–52.0)
Hemoglobin: 15 g/dL (ref 13.0–17.0)
MCH: 31.1 pg (ref 26.0–34.0)
MCHC: 34.1 g/dL (ref 30.0–36.0)
MCV: 91.1 fL (ref 78.0–100.0)
Platelets: 224 10*3/uL (ref 150–400)
RBC: 4.83 MIL/uL (ref 4.22–5.81)
RDW: 12.7 % (ref 11.5–15.5)
WBC: 10.5 10*3/uL (ref 4.0–10.5)

## 2011-01-27 LAB — BASIC METABOLIC PANEL
BUN: 14 mg/dL (ref 6–23)
CO2: 25 mEq/L (ref 19–32)
Calcium: 10.3 mg/dL (ref 8.4–10.5)
Chloride: 101 mEq/L (ref 96–112)
Creatinine, Ser: 0.86 mg/dL (ref 0.50–1.35)
GFR calc Af Amer: >60 mL/min (ref >60)
GFR calc non Af Amer: >60 mL/min (ref >60)
Glucose, Bld: 88 mg/dL (ref 70–99)
Potassium: 4.5 mEq/L (ref 3.5–5.1)
Sodium: 137 mEq/L (ref 135–145)

## 2011-01-27 LAB — SURGICAL PCR SCREEN
MRSA, PCR: NEGATIVE
Staphylococcus aureus: POSITIVE — AB

## 2011-02-04 ENCOUNTER — Other Ambulatory Visit (INDEPENDENT_AMBULATORY_CARE_PROVIDER_SITE_OTHER): Payer: Self-pay | Admitting: Surgery

## 2011-02-04 ENCOUNTER — Inpatient Hospital Stay (HOSPITAL_COMMUNITY)
Admission: RE | Admit: 2011-02-04 | Discharge: 2011-02-08 | DRG: 334 | Disposition: A | Discharge status: Home / Self Care | Payer: Medicare Other | Source: Ambulatory Visit | Attending: Surgery | Admitting: Surgery

## 2011-02-04 DIAGNOSIS — F341 Dysthymic disorder: Secondary | ICD-10-CM | POA: Diagnosis present

## 2011-02-04 DIAGNOSIS — I252 Old myocardial infarction: Secondary | ICD-10-CM

## 2011-02-04 DIAGNOSIS — N365 Urethral false passage: Secondary | ICD-10-CM | POA: Diagnosis present

## 2011-02-04 DIAGNOSIS — C20 Malignant neoplasm of rectum: Principal | ICD-10-CM | POA: Diagnosis present

## 2011-02-04 DIAGNOSIS — R339 Retention of urine, unspecified: Secondary | ICD-10-CM | POA: Diagnosis not present

## 2011-02-04 DIAGNOSIS — I2510 Atherosclerotic heart disease of native coronary artery without angina pectoris: Secondary | ICD-10-CM | POA: Diagnosis present

## 2011-02-04 DIAGNOSIS — E7800 Pure hypercholesterolemia, unspecified: Secondary | ICD-10-CM | POA: Diagnosis present

## 2011-02-04 DIAGNOSIS — Z9861 Coronary angioplasty status: Secondary | ICD-10-CM

## 2011-02-04 DIAGNOSIS — K648 Other hemorrhoids: Secondary | ICD-10-CM

## 2011-02-04 DIAGNOSIS — F411 Generalized anxiety disorder: Secondary | ICD-10-CM | POA: Diagnosis present

## 2011-02-04 DIAGNOSIS — R310 Gross hematuria: Secondary | ICD-10-CM | POA: Diagnosis not present

## 2011-02-04 DIAGNOSIS — I10 Essential (primary) hypertension: Secondary | ICD-10-CM | POA: Diagnosis present

## 2011-02-05 LAB — CBC
HCT: 37.2 % — ABNORMAL LOW (ref 39.0–52.0)
Hemoglobin: 12.8 g/dL — ABNORMAL LOW (ref 13.0–17.0)
MCH: 31 pg (ref 26.0–34.0)
MCHC: 34.4 g/dL (ref 30.0–36.0)
MCV: 90.1 fL (ref 78.0–100.0)
Platelets: 163 10*3/uL (ref 150–400)
RBC: 4.13 MIL/uL — ABNORMAL LOW (ref 4.22–5.81)
RDW: 12.3 % (ref 11.5–15.5)
WBC: 14.3 10*3/uL — ABNORMAL HIGH (ref 4.0–10.5)

## 2011-02-05 LAB — BASIC METABOLIC PANEL
BUN: 13 mg/dL (ref 6–23)
CO2: 26 mEq/L (ref 19–32)
Calcium: 8.6 mg/dL (ref 8.4–10.5)
Chloride: 99 mEq/L (ref 96–112)
Creatinine, Ser: 0.86 mg/dL (ref 0.50–1.35)
GFR calc Af Amer: >60 mL/min (ref >60)
GFR calc non Af Amer: >60 mL/min (ref >60)
Glucose, Bld: 152 mg/dL — ABNORMAL HIGH (ref 70–99)
Potassium: 4 mEq/L (ref 3.5–5.1)
Sodium: 136 mEq/L (ref 135–145)

## 2011-02-10 ENCOUNTER — Telehealth (INDEPENDENT_AMBULATORY_CARE_PROVIDER_SITE_OTHER): Payer: Self-pay

## 2011-02-10 NOTE — Telephone Encounter (Signed)
Message copied by Ethlyn Gallery on Thu Feb 10, 2011  9:25 AM ------      Message from: Ardeth Sportsman      Created: Wed Feb 09, 2011  8:56 AM       Tell pt good news!  No cancer remains = early stage = no more surgery.  F/U clinic ~10days            ----- Message -----         From: [facility] [place] [facility]         Sent: 02/08/2011  11:19 AM           To: Ardeth Sportsman, MD

## 2011-02-10 NOTE — Telephone Encounter (Signed)
Pt notified of the path report to show all of the cancer removed and it is an early stage of cancer no more surgery needed per Dr Michaell Cowing [facility] 02-10-11

## 2011-02-10 NOTE — Telephone Encounter (Signed)
Message copied by Ethlyn Gallery on Thu Feb 10, 2011  2:00 PM ------      Message from: Ardeth Sportsman      Created: Wed Feb 09, 2011  8:56 AM       Tell pt good news!  No cancer remains = early stage = no more surgery.  F/U clinic ~10days            ----- Message -----         From: [facility] In [place] Interface         Sent: 02/08/2011  11:19 AM           To: Ardeth Sportsman, MD

## 2011-02-10 NOTE — Telephone Encounter (Signed)
LMOM for pt to call me back to go over path report per DrGross/ [facility] 02-10-11

## 2011-02-15 NOTE — Discharge Summary (Signed)
NAME:  Shawn Meza, Shawn Meza NO.:  [id number]  MEDICAL RECORD NO.:  [id number]  LOCATION:  1525                         FACILITY:  [facility]  PHYSICIAN:  Ardeth Sportsman, MD     DATE OF BIRTH:  [birth year]  DATE OF ADMISSION:  02/04/2011 DATE OF DISCHARGE:  02/08/2011                              DISCHARGE SUMMARY   PRIMARY CARE PHYSICIAN:  Elvina Sidle, MD  HOSPITALIST:  Yetta Barre.  GASTROENTEROLOGIST:  Jordan Hawks. Elnoria Howard, MD  CARDIOLOGIST:  Verne Carrow, MD  UROLOGIST:  Martina Sinner, MD, [facility].  SURGEON:  Ardeth Sportsman, MD  PRINCIPAL DIAGNOSES: 1. Rectal cancer within mucosal polyp. 2. Severely prolapsing enlarged hemorrhoids.  PROCEDURE PERFORMED:  Partial proctectomy by TEM, and excision of prolapsing internal hemorrhoids on February 04, 2011.  OTHER DIAGNOSES:  Include; 1. Urinary retention, secondary to false passage of urethra, status     post recatheterization x2, first by Dr. Sherron Monday, and second by     Dr. Bjorn Pippin. 2. Coronary artery disease, status post bare metal stenting on Dec 16, 2010. 3. Hypertension. 4. Dyslipidemia. 5. Anxiety. 6. Dysthymia, question depression.  MEDICATIONS:  Noted in the chart, and include; 1. Aspirin. 2. Plavix. 3. Crestor. 4. Protonix. 5. Nitroglycerin. 6. Metoprolol. 7. Lisinopril. 8. Diazepam p.r.n. 9. Visine p.r.n. 10.Oxycodone 5 to 10 mg p.o. q. 4 hours p.r.n. pain. 11.Tylenol 325 mg to 650 mg p.o. q. 6 hours p.r.n. 12.Warm sitz bath q. 2 hours p.r.n. 13.Warm showers p.r.n. pain.  HOSPITAL COURSE:  Mr. Brannigan is a 65 year old obese male who was found to have a polyp in his rectum.  Dr. 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  I then did partial proctectomy to get better definitive margins.  Pathology is pending.  Of note, it was difficult to get a catheter intraoperatively, and Dr. Sherron Monday was able to do cystoscopy and guide catheter  in to extrude the false passage.  Postoperatively, he was started on oral intake.  He began to have flatus.  He was advanced to solid diet.  We removed his catheter on postop day #2.  He had difficulty with urination and retention, required replacement of Foley.  Urology was called in to do this secondary to we are not able to do it.  He did have some blood associated with that but it was cleared up near the time of discharge.  He did have flatus.  He did has a small bowel movement passing his Gelfoam pocket out of the rectum but he is not having any large bowel movement just yet.  However, he is walking in the hallways, and his pain is controlled with oral medications, and he is tolerating p.o. fluids. I think at this time it is reasonable for him to discharge home and close followup on instructions , warning and return to clinic to see me in about 2 weeks.  He should call if he has worsening fevers, chills, sweats, nausea, vomiting, uncontrolled constipation, diarrhea, bleeding or other concerns.  He should follow up with [facility] for voiding trial.  Given hematuria, he might probably had some urethral inflammation.  We will also get the followup plan to see if he needs followup later this week versus in 7 days.  He needs getting leg bag treatment and follow on that.  He should take the fiber twice a day to have regular bowel function and avoid constipation.  He can take laxatives such as MiraLax or milk of magnesia as needed to help encourage bowel function and movements.  He should walk an hour a day and have regular bowel function.  He has controlled his pain as noted above.  We will call him with Urology results as soon as we get them back again and this currently is pending.     Ardeth Sportsman, MD     SCG/MEDQ  D:  02/08/2011  T:  02/08/2011  Job:  [id number]  Electronically Signed by Karie Soda MD on 02/15/2011 12:49:08 PM

## 2011-02-15 NOTE — Op Note (Signed)
NAME:  Shawn Meza, Shawn Meza NO.:  [id number]  MEDICAL RECORD NO.:  [id number]  LOCATION:  1230                         FACILITY:  [facility]  PHYSICIAN:  Ardeth Sportsman, MD     DATE OF BIRTH:  Oct 18, 1945  DATE OF PROCEDURE:  02/04/2011 DATE OF DISCHARGE:                              OPERATIVE REPORT   PRIMARY CARE PHYSICIANS:  Elvina Sidle, MD, possibly Sanda Linger, MD.  GASTROENTEROLOGIST:  Jordan Hawks. Elnoria Howard, M.D.  CARDIOLOGIST:  Verne Carrow, MD  SURGEON:  Ardeth Sportsman, M.D.  PREOPERATIVE DIAGNOSES: 1. Rectal cancer and polyps, status post endoscopic mucosal resection     and prior polypectomy by Dr. Jeani Hawking, concerned for persistent     rectal cancer. 2. Large prolapsing internal hemorrhoids x2.  POSTOPERATIVE DIAGNOSIS: 1. Rectal cancer and polyps, status post endoscopic mucosal resection     and prior polypectomy by Dr. Jeani Hawking, concerned for persistent     rectal cancer. 2. Large prolapsing internal hemorrhoids x2.  PROCEDURE PERFORMED: 1. Partial proctectomy by TEM (transanal endoscopic microsurgery). 2. Excision of prolapsing internal hemorrhoids x2.  SPECIMENS:  Left lateral rectal wall (yellow pin equals proximal, blue pin equals distal, red pin equals right lateral, pink equals left lateral).  This was presented to Pathology and discussed with Dr. Luisa Hart.  ESTIMATED BLOOD LOSS:  100 mL.  COMPLICATIONS:  None.  MAJOR INDICATIONS:  Mr. Kloepfer is a morbidly obese male who was found to have a polyp on endoscopy by Dr. Jeani Hawking.  He had partial polypectomy and came back for endoscopic mucosal resection.  However, there was adenocarcinoma noted within the deeper mucosal resection.  He was therefore sent to me for partial proctectomy.  The patient does struggle with prolapsing hemorrhage that he has had for decades.  He was told he could not have surgery because of some reason.  Anatomy and physiology of the digestive  tract was explained. Pathophysiology of rectal cancer and prolapsing hemorrhoids was discussed.  Options discussed, recommendation was made for partial proctectomy for more definitive excisional biopsy with possible curative intent.  Possibility of needing a more significant segmental resection was discussed.  Excision of hemorrhoids with recent stricture, bleeding, and recurrence were discussed as well.  Importance of bowel regimen was discussed.  Risks, benefits, and other alternatives were discussed. Questions answered, he understand, agreed to proceed.  Of note, he did have had exertional chest pain when I first saw him and I recommended cardiac evaluation.  He ended up being seen by Dr. Clifton James.  He ended up placing a bare-metal stent in the circumflex.  He has other disease, we would treat medically, with anticoagulation.  He was anticoagulated for months and therefore surgery was delayed.  He was reasonable to hold off and held his Plavix for 3 days preop and then resume postop.  Preoperative cardiac risks were discussed as well.  I discussed with Anesthesia as well.  OPERATIVE FINDINGS:  He had a stellate scar about 6 cm in the anal verge in the left lateral rectal wall at approximately 3 o' clock to 5 o' clock.  There was no big deeper mass concerning for a definite rectal cancer.  He had giant prolapsing hemorrhoids, right lateral, larger than the left lateral with some external hemorrhoidal components.  He had a moderately large left anterior hemorrhoid, but after surgery and ligation that seemed to have calmed down, that one did not prolapse.  DESCRIPTION OF PROCEDURE:  Informed consent was confirmed.  The patient underwent general anesthesia without any difficulty.  He had preoperative  margin length placed, given his significant cardiac disease in recent events.  He was positioned high lithotomy.  His perineum was prepped and draped in the sterile fashion.  He had had  a general prep and had received IV antibiotics.  Surgical time-out confirmed the plan.  On examination, he with a cough on the vent, he immediately prolapse to giant hemorrhoids.  I did rectal examination and his sphincters were somewhat stretched out, but intact.  I did not see the scar in the right posterior hemorrhoid as I thought I had seen before.  I went ahead, did dilation and placed a 4 cm TEO Storz system in place.  I did inspection and started in the proximal rectum and then returned back down.  The mucosa seemed normal until about 7 cm and then it was obviously stretched out rectum.  In the left lower aspect, at around 4 o' clock, I could see a subtle stellate scar.  I removed the anoscope, TEO system and digitally felt the scar and this seemed to correlate with Dr. Haywood Pao pictures.  I felt this was the most suspicious area.  He had not been tattooed, unfortunately, because of the delay of surgery with his cardiac issues.  It was a little more challenging at this time as opposed in the office 2 months ago.  However, I did feel this was the most consistent area as nothing else felt nor looked abnormal.  Again, I did correlate with pictures Dr. Elnoria Howard had provided.  I went ahead and used needle-tip cautery and scored a 1-2 cm rim around it peripherally.  I went through the rectal wall on the proximal margin and once again through the rectal wall and transitioned over ultrasonic dissection.  I dissected anteriorly and inferiorly.  I then went anterolaterally more proximally and then posterolaterally more proximally until I came up to the proximal margin.  I got into the mesorectum and did a good swath of mesorectum to the lateral sidewall and posteriorly and did that as well for good mobilization.  I identified and took extra care to make sure I scored the mucosa on the proximal end to avoid any contortion of anatomy.  I came through the rectal wall anteriorly.  I took some extra  mesorectum more proximally until I can remove the good specimen out.  We packed the rectal wound with some gauze.  I went ahead and I marked the specimen as noted above. I scrubbed out, went to Pathology, and showed it to the pathology team on appropriate orientation, which they understood.  His hemostasis was excellent.  This lesion was too proximal from me to do transanal closure, so therefore I used the TEM system and reapproximated the wound.  It involved about a half of the circumference.  I used 2-0 Vicryl interrupted stitches using laparoscopic intracorporeal suturing, horizontal mattress sutures x3 to help anchor the corners of wall.  I then used a Covidien laparoscopic suturing device to run some 2-0 Vicryl in between these marking stitches.  This helped to get good bites of recto-rectal approximation and closed it horizontally.  I did meticulous inspection  of the wound and make sure that there were gaps.  Hemostasis was excellent.  I evacuated excess carbon dioxide off the colon.  With that TEM resection, the hemorrhoids were still very prolapsed.  I was worried about hygiene issues and he had pain and discomfort there. I therefore decided to excise the hemorrhoids.  I did this using the Harmonic scalpel at the proximal apex and then coming back distally.  I tried not be overly aggressive in taking mucosa, although the rectal wall and the hemorrhoids were massively dilated.  I did have to come across over the endoderm and excise some lead-point external hemorrhoids from this as well.  I started with the left lateral.  I closed that using 2-0 Vicryl stitch with a tie way up top and then running down to the external component and then tying that and running down back up to help form hemorrhoidopexy of that closure as well.  I did a similar closure on the right side.  However, he had a very prominent hemorrhoidal artery at the base.  This is where I lost about 50 cc of blood, but  eventually after getting some figure-of-eight suture ligatures around it, I had good hemostasis.  Again, I went ahead and did a hemorrhoidopexy in a similar fashion.  The last hemorrhoidal pile had flattened and stretched out a little bit. It was more the left anterior aspect.  Given the fact that I had done complete mucosal resection proximal to that, I left that alone less than 4 mm stricture by doing 3 large hemorrhoidal resections.  I did copious irrigation of the rectum.  The hemostasis was excellent. I could feel the closure just at the tip of my finger at about 6 cm. With a closure, there were no more prolapsing hemorrhoids and no more external hemorrhoids.  Sphincters were again mildly stretched out, but intact with no injury.  Hemostasis was good.  The patient was extubated and taken to recovery room in stable condition.  Given his complex cardiac history, we will monitored in the step down unit and see how he does.     Ardeth Sportsman, MD     SCG/MEDQ  D:  02/04/2011  T:  02/05/2011  Job:  [id number]  cc:   Verne Carrow, MD [address] [place] [address]  Jordan Hawks. Elnoria Howard, MD Fax: [phone]  Elvina Sidle, M.D. Fax: [phone]  Electronically Signed by Karie Soda MD on 02/15/2011 12:49:23 PM

## 2011-02-15 NOTE — Op Note (Signed)
  NAME:  Shawn Meza, Shawn Meza NO.:  [id number]  MEDICAL RECORD NO.:  [id number]  LOCATION:  DAYL                         FACILITY:  [facility]  PHYSICIAN:  Martina Sinner, MD DATE OF BIRTH:  [birth year]  DATE OF PROCEDURE:  02/04/2011 DATE OF DISCHARGE:                              OPERATIVE REPORT   PREOPERATIVE DIAGNOSIS:  Difficulty inserting catheter.  POSTOPERATIVE DIAGNOSES:  Difficulty inserting catheter; mild false passage, bladder neck.  SURGERY: 1. Cystoscopy. 2. Insertion of catheter.  I was called intraoperatively by Dr. Michaell Cowing on Shawn Meza who was having rectal surgery.  We could not insert the catheter at the level of the bladder neck in spite of different attempts and different catheter size.  We prepped and draped the patient in a sterile technique and performed cystoscopy.  Flexible cystoscope was utilized.  Penile, bulbar, and membranous urethra were normal.  There was a short prostatic urethra with mild bilobar enlargement of the prostate.  His bladder neck was opened and looked to be about 18-20 [place].  He had a bit of a false passage at 6 o'clock.  Bladder mucosa and trigone were normal.  There was no stitch, foreign body, or carcinoma.  Through the flexible scope, I passed a sensor wire, curling into the bladder.  I then placed a 16-French Council catheter over the wire and it went in snugly.  I irrigated it nicely.  There was no blood and irrigated freely.  Clinically, the catheter was in the bladder.  Shawn Meza has a mild bladder neck issue.  I will see him postprocedure if he is fail trial of voiding or chronic voiding symptoms.  I spoke to Dr. Michaell Cowing in this regard.          ______________________________ Martina Sinner, MD     SAM/MEDQ  D:  02/04/2011  T:  02/04/2011  Job:  [id number]  Electronically Signed by Alfredo Martinez MD on 02/15/2011 08:14:47 AM

## 2011-02-21 ENCOUNTER — Encounter (INDEPENDENT_AMBULATORY_CARE_PROVIDER_SITE_OTHER): Payer: Medicare Other | Admitting: Surgery

## 2011-03-03 ENCOUNTER — Ambulatory Visit (INDEPENDENT_AMBULATORY_CARE_PROVIDER_SITE_OTHER): Payer: Medicare Other | Admitting: Cardiovascular Disease

## 2011-03-03 ENCOUNTER — Encounter: Payer: Self-pay | Admitting: Cardiovascular Disease

## 2011-03-03 VITALS — BP 130/78 | HR 67 | Resp 16 | Ht 67.0 in | Wt 210.0 lb

## 2011-03-03 DIAGNOSIS — I2510 Atherosclerotic heart disease of native coronary artery without angina pectoris: Secondary | ICD-10-CM

## 2011-03-03 MED ORDER — LISINOPRIL 5 MG PO TABS: 5.0000 mg | ORAL_TABLET | Freq: Every day | ORAL | Status: DC

## 2011-03-03 NOTE — Progress Notes (Signed)
History of Present Illness: 65 yo WM with history of CAD with recent admission to [facility] with a NSTEMI , now s/p placement of a bare metal stent in the proximal Circumflex artery. He has also been recently diagnosed with colon cancer. His cardiac catheterization was performed by Dr. Gala Romney. He was found to have severe disease in the proximal Circumflex artery as well as his LAD, Ramus and PDA. LV function was normal by LV gram. The circumflex lesion was felt to be the culprit lesion. We elected to place a bare metal stent in the Circumflex artery so that he could have one month of dual antiplatelet therapy before his colon surgery. He also has a history of HTN.  He is here today for follow up.   He had his colon surgery three weeks ago. There was no evidence of cancer. He has been back on his Plavix and ASA. He has had no chest pain. His breathing has been ok. His energy level has been down after his surgery.   Past Medical History  Diagnosis Date  . GERD (gastroesophageal reflux disease)   . Headache   . Hyperlipidemia   . Hypertension   . Nephrolithiasis     hx of  . Transient ischemic attack     hx of  . BPH (benign prostatic hypertrophy)   . CAD (coronary artery disease)   . Colon cancer     Past Surgical History  Procedure Date  . Transurethral resection of prostate     Current Outpatient Prescriptions  Medication Sig Dispense Refill  . aspirin 81 MG tablet Take 81 mg by mouth daily.        . clopidogrel (PLAVIX) 75 MG tablet Take 75 mg by mouth daily.        Marland Kitchen lisinopril (PRINIVIL,ZESTRIL) 10 MG tablet Take 10 mg by mouth daily.        . metoprolol tartrate (LOPRESSOR) 25 MG tablet Take 25 mg by mouth 2 (two) times daily.        . nitroGLYCERIN (NITROSTAT) 0.4 MG SL tablet Place 0.4 mg under the tongue every 5 (five) minutes as needed.        . pantoprazole (PROTONIX) 40 MG tablet Take 40 mg by mouth 2 (two) times daily.        . rosuvastatin (CRESTOR) 20 MG  tablet Take 20 mg by mouth daily.        Marland Kitchen VITAMIN D, CHOLECALCIFEROL, PO Take by mouth daily.          Allergies  Allergen Reactions  . Testosterone     REACTION: abd pain    History   Social History  . Marital Status: Married    Spouse Name: N/A    Number of Children: N/A  . Years of Education: N/A   Occupational History  . retired    Social History Main Topics  . Smoking status: Never Smoker   . Smokeless tobacco: Not on file  . Alcohol Use: No  . Drug Use: No  . Sexually Active: Not on file   Other Topics Concern  . Not on file   Social History Narrative  . No narrative on file    Family History  Problem Relation Age of Onset  . Coronary artery disease Other     family hx of male 1st degree relative ,44  . Hyperlipidemia      family hx of  . Hypertension      family hx of  .  Arthritis      family hx of    Review of Systems:  As stated in the HPI and otherwise negative.   BP 130/78  Pulse 67  Resp 16  Ht 5\' 7"  (1.702 m)  Wt 210 lb (95.255 kg)  BMI 32.89 kg/m2  Physical Examination: General: Well developed, well nourished, NAD HEENT: OP clear, mucus membranes moist SKIN: warm, dry. No rashes. Neuro: No focal deficits Musculoskeletal: Muscle strength 5/5 all ext Psychiatric: Mood and affect normal Neck: No JVD, no carotid bruits, no thyromegaly, no lymphadenopathy. Lungs:Clear bilaterally, no wheezes, rhonci, crackles Cardiovascular: Regular rate and rhythm. No murmurs, gallops or rubs. Abdomen:Soft. Bowel sounds present. Non-tender.  Extremities: No lower extremity edema. Pulses are 1 + in the bilateral DP/PT.

## 2011-03-03 NOTE — Assessment & Plan Note (Addendum)
Stable. No CP. Will manage medically for now. I will lower his Lisinopril to 5 mg per day as he has been fatigued and reports SBP in the 90s at home. Will see back in three months. Will consider stress myoview at that time.

## 2011-03-03 NOTE — Patient Instructions (Signed)
Your physician recommends that you schedule a follow-up appointment in: 3 months  Your physician has recommended you make the following change in your medication: DECREASE LISINOPRIL to 5 mg daily.

## 2011-03-11 ENCOUNTER — Telehealth: Payer: Self-pay | Admitting: Cardiovascular Disease

## 2011-03-11 DIAGNOSIS — I2510 Atherosclerotic heart disease of native coronary artery without angina pectoris: Secondary | ICD-10-CM

## 2011-03-11 MED ORDER — LISINOPRIL 5 MG PO TABS: 5.0000 mg | ORAL_TABLET | Freq: Every day | ORAL | Status: DC

## 2011-03-11 NOTE — Telephone Encounter (Signed)
Pt called and needs to find out which blood pressure medication he is suppose to d/c.  Please call him.

## 2011-03-11 NOTE — Telephone Encounter (Signed)
Patient aware that Lisinopril was decreased to 5 mg daily. He should remain on Lopressor.

## 2011-06-06 ENCOUNTER — Ambulatory Visit: Payer: Medicare Other | Admitting: Cardiovascular Disease

## 2011-07-19 ENCOUNTER — Ambulatory Visit (INDEPENDENT_AMBULATORY_CARE_PROVIDER_SITE_OTHER): Payer: Medicare Other

## 2011-07-19 DIAGNOSIS — M25569 Pain in unspecified knee: Secondary | ICD-10-CM

## 2011-07-19 DIAGNOSIS — F411 Generalized anxiety disorder: Secondary | ICD-10-CM

## 2011-08-18 ENCOUNTER — Encounter: Payer: Self-pay | Admitting: Cardiovascular Disease

## 2011-08-18 ENCOUNTER — Encounter: Payer: Self-pay | Admitting: *Deleted

## 2011-08-18 ENCOUNTER — Ambulatory Visit (INDEPENDENT_AMBULATORY_CARE_PROVIDER_SITE_OTHER): Payer: Medicare Other | Admitting: Cardiovascular Disease

## 2011-08-18 VITALS — BP 145/84 | HR 77 | Ht 66.0 in | Wt 226.0 lb

## 2011-08-18 DIAGNOSIS — I2510 Atherosclerotic heart disease of native coronary artery without angina pectoris: Secondary | ICD-10-CM

## 2011-08-18 NOTE — Assessment & Plan Note (Signed)
He has multi-vessel CAD and has been stable. The culprit lesion for his NSTEMI in May 2012 was his Circumflex. This was treated with a bare metal stent and then he had his colon resection for colon cancer. He is now completely over his cancer treatment. He is doing well and tolerating all medical therapy. He is having substernal chest pain. Will arrange cardiac cath to formulate plans for best treatment of his multi-vessel CAD. He will most likely need CABG for revascularization. The patient was under the impression that his CAD would not be treated with CABG or PCI at this time. There has been some misunderstanding in our communication. He admits to some confusion post hospitalization. My plans have been for further ischemic evaluation. Will arrange left heart cath for 08/25/11 in the main cath lab. Risks and beneftis reviewed with pt. Groin approach as Dr. Gala Romney had difficulty engaging the aortic root from the right radial approach in May. Pre-cath labs today.

## 2011-08-18 NOTE — Patient Instructions (Signed)
Your physician recommends that you schedule a follow-up appointment in: 4 weeks.   Your physician has requested that you have a cardiac catheterization. Cardiac catheterization is used to diagnose and/or treat various heart conditions. Doctors may recommend this procedure for a number of different reasons. The most common reason is to evaluate chest pain. Chest pain can be a symptom of coronary artery disease (CAD), and cardiac catheterization can show whether plaque is narrowing or blocking your heart's arteries. This procedure is also used to evaluate the valves, as well as measure the blood flow and oxygen levels in different parts of your heart. For further information please visit [email or web address] Please follow instruction sheet, as given.

## 2011-08-18 NOTE — Progress Notes (Signed)
History of Present Illness: 66 yo WM with history of CAD with admission to [facility] May 2012 with a NSTEMI , now s/p placement of a bare metal stent in the proximal Circumflex artery. He has also been recently diagnosed with colon cancer. His cardiac catheterization was performed by Dr. Gala Romney. He was found to have severe disease in the proximal Circumflex artery as well as his LAD, Ramus and PDA. LV function was normal by LV gram. The circumflex lesion was felt to be the culprit lesion. We elected to place a bare metal stent in the Circumflex artery so that he could have one month of dual antiplatelet therapy before his colon surgery. He also has a history of HTN. He had his colon surgery and was told that his cancer was cured by surgery. He has been back on his Plavix and ASA and tolerating this well. I last saw him in May. He is a very complex situation. His coronary disease was treated conservatively given his colon cancer back in may. I fixed the culprit lesion for his NSTEMI with a bare metal stent to allow him to go for surgical procedure. He was not a good candidate for CABG at that time due to his active cancer. The plan was to manage his CAD conservatively and when he had completely recovered from his colon procedure, plan repeat cath or stress test in order to discuss best way to treat his multi-vessel disease. He missed his f/u appt with me in November. He is now here today for f/u.   He tells me that he has had some substernal chest pain. This occurs with exertion and at rest. His breathing has been ok. His energy level has been down.   His primary care is with Dr. Milus Glazier,  [facility].    Past Medical History  Diagnosis Date  . GERD (gastroesophageal reflux disease)   . Headache   . Hyperlipidemia   . Hypertension   . Nephrolithiasis     hx of  . Transient ischemic attack     hx of  . BPH (benign prostatic hypertrophy)   . CAD (coronary  artery disease)   . Colon cancer     Past Surgical History  Procedure Date  . Transurethral resection of prostate     Current Outpatient Prescriptions  Medication Sig Dispense Refill  . aspirin 81 MG tablet Take 81 mg by mouth daily.        . clopidogrel (PLAVIX) 75 MG tablet Take 75 mg by mouth daily.        Marland Kitchen lisinopril (PRINIVIL,ZESTRIL) 5 MG tablet Take 1 tablet (5 mg total) by mouth daily.  30 tablet  6  . metoprolol tartrate (LOPRESSOR) 25 MG tablet 1 tab daily      . nitroGLYCERIN (NITROSTAT) 0.4 MG SL tablet Place 0.4 mg under the tongue every 5 (five) minutes as needed.        . rosuvastatin (CRESTOR) 20 MG tablet Take 20 mg by mouth daily.        Marland Kitchen VITAMIN D, CHOLECALCIFEROL, PO Take by mouth daily.          Allergies  Allergen Reactions  . Testosterone     REACTION: abd pain    History   Social History  . Marital Status: Married    Spouse Name: N/A    Number of Children: N/A  . Years of Education: N/A   Occupational History  . retired  Social History Main Topics  . Smoking status: Never Smoker   . Smokeless tobacco: Not on file  . Alcohol Use: No  . Drug Use: No  . Sexually Active: Not on file   Other Topics Concern  . Not on file   Social History Narrative  . No narrative on file    Family History  Problem Relation Age of Onset  . Coronary artery disease Other     family hx of male 1st degree relative ,70  . Hyperlipidemia      family hx of  . Hypertension      family hx of  . Arthritis      family hx of    Review of Systems:  As stated in the HPI and otherwise negative.   BP 145/84  Pulse 77  Ht 5\' 6"  (1.676 m)  Wt 226 lb (102.513 kg)  BMI 36.48 kg/m2  Physical Examination: General: Well developed, well nourished, NAD HEENT: OP clear, mucus membranes moist SKIN: warm, dry. No rashes. Neuro: No focal deficits Musculoskeletal: Muscle strength 5/5 all ext Psychiatric: Mood and affect normal Neck: No JVD, no carotid bruits, no  thyromegaly, no lymphadenopathy. Lungs:Clear bilaterally, no wheezes, rhonci, crackles Cardiovascular: Regular rate and rhythm. No murmurs, gallops or rubs. Abdomen:Soft. Bowel sounds present. Non-tender.  Extremities: No lower extremity edema. Pulses are 2 + in the bilateral DP/PT.  EKG:  Cardiac Cath May 19,2012: Left main had an ostial 20% lesion.      The LAD was diffusely and severely diseased.  It gave off a heavily   diseased diagonal as well.  In the ostium of the LAD, there was a 60%   lesion.  In the proximal throughout the midsection, there was tubular   80% lesion.  In the midsection, there was a tubular 70% lesion.  The   distal apical LAD had diffuse 95% plaquing.  There was a 70% lesion in   the ostium of the diagonal with diffuse disease throughout.      Left circumflex gave off a ramus branch and two OM branches.  The ostium   of the ramus branch was diffuse 80% to 90% stenosis.  In the proximal   left circ, there is a 30% stenosis followed by a 99% stenosis in the   midsection.  In first OM, there is a 40% lesion proximal and a 40%   lesion mid.      Right coronary artery was a dominant vessel, had a 30% lesion   proximally, a 50% lesion in the midsection.  PDA had a 40% lesion   osteally and posterolateral was small, diffusely diseased vessel with   95% stenosis throughout.

## 2011-08-19 ENCOUNTER — Encounter (HOSPITAL_COMMUNITY): Payer: Self-pay | Admitting: Pharmacy Technician

## 2011-08-19 LAB — CBC WITH DIFFERENTIAL/PLATELET
Basophils Absolute: 0 10*3/uL (ref 0.0–0.1)
Basophils Relative: 0.2 % (ref 0.0–3.0)
Eosinophils Absolute: 0.4 10*3/uL (ref 0.0–0.7)
Eosinophils Relative: 4.9 % (ref 0.0–5.0)
HCT: 43 % (ref 39.0–52.0)
Hemoglobin: 14.7 g/dL (ref 13.0–17.0)
Lymphocytes Relative: 37 % (ref 12.0–46.0)
Lymphs Abs: 2.8 10*3/uL (ref 0.7–4.0)
MCHC: 34.3 g/dL (ref 30.0–36.0)
MCV: 92.4 fl (ref 78.0–100.0)
Monocytes Absolute: 0.6 10*3/uL (ref 0.1–1.0)
Monocytes Relative: 8.4 % (ref 3.0–12.0)
Neutro Abs: 3.8 10*3/uL (ref 1.4–7.7)
Neutrophils Relative %: 49.5 % (ref 43.0–77.0)
Platelets: 190 10*3/uL (ref 150.0–400.0)
RBC: 4.65 Mil/uL (ref 4.22–5.81)
RDW: 13.8 % (ref 11.5–14.6)
WBC: 7.7 10*3/uL (ref 4.5–10.5)

## 2011-08-19 LAB — BASIC METABOLIC PANEL
BUN: 19 mg/dL (ref 6–23)
CO2: 23 mEq/L (ref 19–32)
Calcium: 8.8 mg/dL (ref 8.4–10.5)
Chloride: 104 mEq/L (ref 96–112)
Creatinine, Ser: 1 mg/dL (ref 0.4–1.5)
GFR: 79.12 mL/min (ref >60.00)
Glucose, Bld: 101 mg/dL — ABNORMAL HIGH (ref 70–99)
Potassium: 3.8 mEq/L (ref 3.5–5.1)
Sodium: 141 mEq/L (ref 135–145)

## 2011-08-19 LAB — PROTIME-INR
INR: 1 ratio (ref 0.8–1.0)
Prothrombin Time: 11.3 s (ref 10.2–12.4)

## 2011-08-22 ENCOUNTER — Encounter (HOSPITAL_COMMUNITY): Payer: Self-pay | Admitting: Pharmacy Technician

## 2011-08-25 ENCOUNTER — Encounter (HOSPITAL_COMMUNITY)
Admission: RE | Disposition: A | Discharge status: Home / Self Care | Payer: Self-pay | Source: Ambulatory Visit | Attending: Cardiovascular Disease

## 2011-08-25 ENCOUNTER — Ambulatory Visit (HOSPITAL_COMMUNITY)
Admission: RE | Admit: 2011-08-25 | Discharge: 2011-08-25 | Disposition: A | Discharge status: Home / Self Care | Payer: Medicare Other | Source: Ambulatory Visit | Attending: Cardiovascular Disease | Admitting: Cardiovascular Disease

## 2011-08-25 DIAGNOSIS — Z8673 Personal history of transient ischemic attack (TIA), and cerebral infarction without residual deficits: Secondary | ICD-10-CM | POA: Insufficient documentation

## 2011-08-25 DIAGNOSIS — K219 Gastro-esophageal reflux disease without esophagitis: Secondary | ICD-10-CM | POA: Insufficient documentation

## 2011-08-25 DIAGNOSIS — I10 Essential (primary) hypertension: Secondary | ICD-10-CM | POA: Insufficient documentation

## 2011-08-25 DIAGNOSIS — I2510 Atherosclerotic heart disease of native coronary artery without angina pectoris: Secondary | ICD-10-CM

## 2011-08-25 DIAGNOSIS — Z9861 Coronary angioplasty status: Secondary | ICD-10-CM | POA: Insufficient documentation

## 2011-08-25 HISTORY — PX: LEFT HEART CATHETERIZATION WITH CORONARY ANGIOGRAM: SHX5451

## 2011-08-25 SURGERY — LEFT HEART CATHETERIZATION WITH CORONARY ANGIOGRAM
Anesthesia: LOCAL

## 2011-08-25 MED ORDER — LIDOCAINE HCL (PF) 1 % IJ SOLN
INTRAMUSCULAR | Status: AC
  Filled 2011-08-25: qty 30

## 2011-08-25 MED ORDER — SODIUM CHLORIDE 0.9 % IV SOLN
INTRAVENOUS | Status: DC
  Administered 2011-08-25: 06:00:00 via INTRAVENOUS

## 2011-08-25 MED ORDER — MIDAZOLAM HCL 2 MG/2ML IJ SOLN
INTRAMUSCULAR | Status: AC
  Filled 2011-08-25: qty 2

## 2011-08-25 MED ORDER — NITROGLYCERIN 0.2 MG/ML ON CALL CATH LAB
INTRAVENOUS | Status: AC
  Filled 2011-08-25: qty 1

## 2011-08-25 MED ORDER — SODIUM CHLORIDE 0.9 % IV SOLN
250.0000 mL | INTRAVENOUS | Status: DC | PRN
Start: 2011-08-25 — End: 2011-08-25

## 2011-08-25 MED ORDER — SODIUM CHLORIDE 0.9 % IJ SOLN: 3.0000 mL | Freq: Two times a day (BID) | INTRAMUSCULAR | Status: DC

## 2011-08-25 MED ORDER — DIAZEPAM 5 MG PO TABS
5.0000 mg | ORAL_TABLET | ORAL | Status: AC
  Administered 2011-08-25: 5 mg via ORAL
  Filled 2011-08-25: qty 1

## 2011-08-25 MED ORDER — HEPARIN (PORCINE) IN NACL 2-0.9 UNIT/ML-% IJ SOLN
INTRAMUSCULAR | Status: AC
  Filled 2011-08-25: qty 1000

## 2011-08-25 MED ORDER — FENTANYL CITRATE 0.05 MG/ML IJ SOLN
INTRAMUSCULAR | Status: AC
  Filled 2011-08-25: qty 2

## 2011-08-25 MED ORDER — CLOPIDOGREL BISULFATE 75 MG PO TABS: 75.0000 mg | ORAL_TABLET | ORAL | Status: DC

## 2011-08-25 MED ORDER — SODIUM CHLORIDE 0.9 % IV SOLN: INTRAVENOUS | Status: DC

## 2011-08-25 MED ORDER — ACETAMINOPHEN 325 MG PO TABS: 650.0000 mg | ORAL_TABLET | ORAL | Status: DC | PRN

## 2011-08-25 MED ORDER — SODIUM CHLORIDE 0.9 % IJ SOLN: 3.0000 mL | INTRAMUSCULAR | Status: DC | PRN

## 2011-08-25 MED ORDER — ONDANSETRON HCL 4 MG/2ML IJ SOLN: 4.0000 mg | Freq: Four times a day (QID) | INTRAMUSCULAR | Status: DC | PRN

## 2011-08-25 MED ORDER — ASPIRIN 81 MG PO CHEW
324.0000 mg | CHEWABLE_TABLET | ORAL | Status: AC
  Administered 2011-08-25: 324 mg via ORAL
  Filled 2011-08-25: qty 4

## 2011-08-25 NOTE — H&P (View-Only) (Signed)
 History of Present Illness: 65 yo WM with history of CAD with admission to [facility] May 2012 with a NSTEMI , now s/p placement of a bare metal stent in the proximal Circumflex artery. He has also been recently diagnosed with colon cancer. His cardiac catheterization was performed by Dr. Bensimhon. He was found to have severe disease in the proximal Circumflex artery as well as his LAD, Ramus and PDA. LV function was normal by LV gram. The circumflex lesion was felt to be the culprit lesion. We elected to place a bare metal stent in the Circumflex artery so that he could have one month of dual antiplatelet therapy before his colon surgery. He also has a history of HTN. He had his colon surgery and was told that his cancer was cured by surgery. He has been back on his Plavix and ASA and tolerating this well. I last saw him in May. He is a very complex situation. His coronary disease was treated conservatively given his colon cancer back in may. I fixed the culprit lesion for his NSTEMI with a bare metal stent to allow him to go for surgical procedure. He was not a good candidate for CABG at that time due to his active cancer. The plan was to manage his CAD conservatively and when he had completely recovered from his colon procedure, plan repeat cath or stress test in order to discuss best way to treat his multi-vessel disease. He missed his f/u appt with me in November. He is now here today for f/u.   He tells me that he has had some substernal chest pain. This occurs with exertion and at rest. His breathing has been ok. His energy level has been down.   His primary care is with Dr. Lauenstein,  [facility].    Past Medical History  Diagnosis Date  . GERD (gastroesophageal reflux disease)   . Headache   . Hyperlipidemia   . Hypertension   . Nephrolithiasis     hx of  . Transient ischemic attack     hx of  . BPH (benign prostatic hypertrophy)   . CAD (coronary  artery disease)   . Colon cancer     Past Surgical History  Procedure Date  . Transurethral resection of prostate     Current Outpatient Prescriptions  Medication Sig Dispense Refill  . aspirin 81 MG tablet Take 81 mg by mouth daily.        . clopidogrel (PLAVIX) 75 MG tablet Take 75 mg by mouth daily.        . lisinopril (PRINIVIL,ZESTRIL) 5 MG tablet Take 1 tablet (5 mg total) by mouth daily.  30 tablet  6  . metoprolol tartrate (LOPRESSOR) 25 MG tablet 1 tab daily      . nitroGLYCERIN (NITROSTAT) 0.4 MG SL tablet Place 0.4 mg under the tongue every 5 (five) minutes as needed.        . rosuvastatin (CRESTOR) 20 MG tablet Take 20 mg by mouth daily.        . VITAMIN D, CHOLECALCIFEROL, PO Take by mouth daily.          Allergies  Allergen Reactions  . Testosterone     REACTION: abd pain    History   Social History  . Marital Status: Married    Spouse Name: N/A    Number of Children: N/A  . Years of Education: N/A   Occupational History  . retired      Social History Main Topics  . Smoking status: Never Smoker   . Smokeless tobacco: Not on file  . Alcohol Use: No  . Drug Use: No  . Sexually Active: Not on file   Other Topics Concern  . Not on file   Social History Narrative  . No narrative on file    Family History  Problem Relation Age of Onset  . Coronary artery disease Other     family hx of male 1st degree relative ,50  . Hyperlipidemia      family hx of  . Hypertension      family hx of  . Arthritis      family hx of    Review of Systems:  As stated in the HPI and otherwise negative.   BP 145/84  Pulse 77  Ht 5' 6" (1.676 m)  Wt 226 lb (102.513 kg)  BMI 36.48 kg/m2  Physical Examination: General: Well developed, well nourished, NAD HEENT: OP clear, mucus membranes moist SKIN: warm, dry. No rashes. Neuro: No focal deficits Musculoskeletal: Muscle strength 5/5 all ext Psychiatric: Mood and affect normal Neck: No JVD, no carotid bruits, no  thyromegaly, no lymphadenopathy. Lungs:Clear bilaterally, no wheezes, rhonci, crackles Cardiovascular: Regular rate and rhythm. No murmurs, gallops or rubs. Abdomen:Soft. Bowel sounds present. Non-tender.  Extremities: No lower extremity edema. Pulses are 2 + in the bilateral DP/PT.  EKG:  Cardiac Cath May 19,2012: Left main had an ostial 20% lesion.      The LAD was diffusely and severely diseased.  It gave off a heavily   diseased diagonal as well.  In the ostium of the LAD, there was a 60%   lesion.  In the proximal throughout the midsection, there was tubular   80% lesion.  In the midsection, there was a tubular 70% lesion.  The   distal apical LAD had diffuse 95% plaquing.  There was a 70% lesion in   the ostium of the diagonal with diffuse disease throughout.      Left circumflex gave off a ramus branch and two OM branches.  The ostium   of the ramus branch was diffuse 80% to 90% stenosis.  In the proximal   left circ, there is a 30% stenosis followed by a 99% stenosis in the   midsection.  In first OM, there is a 40% lesion proximal and a 40%   lesion mid.      Right coronary artery was a dominant vessel, had a 30% lesion   proximally, a 50% lesion in the midsection.  PDA had a 40% lesion   osteally and posterolateral was small, diffusely diseased vessel with   95% stenosis throughout.  

## 2011-08-25 NOTE — Op Note (Signed)
Cardiac Catheterization Operative Report  DEBORAH DONDERO [id number] 1/24/20138:43 AM Elvina Sidle, MD, MD  Procedure Performed:  1. Left Heart Catheterization 2. Selective Coronary Angiography 3. Left ventricular angiogram  Operator: Verne Carrow, MD  Indication:  Known CAD, chest pain, bare metal stent Circumflex May 2012 (placed in setting of NSTEMI with active colon cancer, now s/p colon resection).                                     Procedure Details: The risks, benefits, complications, treatment options, and expected outcomes were discussed with the patient. The patient and/or family concurred with the proposed plan, giving informed consent. The patient was brought to the cath lab after IV hydration was begun and oral premedication was given. The patient was further sedated with Versed and Fentanyl. The leftt groin was prepped and draped in the usual manner. Using the modified Seldinger access technique, a 5 French sheath was placed in the left femoral artery. Standard diagnostic catheters were used to perform selective coronary angiography. A pigtail catheter was used to perform a left ventricular angiogram.   There were no immediate complications. The patient was taken to the recovery area in stable condition.   Hemodynamic Findings: Central aortic pressure: 120/65 Left ventricular pressure: 104/8/10  Angiographic Findings:  Left main: No evidence of disease.   Left Anterior Descending Artery: The LAD is a small caliber, diffusely disease vessel. There is an ostial 50% stenosis. The proximal to mid vessel has a long segment of 60-70% stenosis. This is unchanged from last years cath. The distal LAd has a 90% stenosis however the vessel is 1.5 mm. The diagonal is a small caliber branch with ostial and proximal 50% stenosis.   Circumflex Artery: Patent stent mid Circumflex. No restenosis. Beyond the stent of the bifurcation of OM1 and the distal Circumflex. The first OM  branch has 40% stenosis in the ostium. The distal Circumflex has 40% stenosis.  There is a small Ramus Intermediate branch with 60% ostial stenosis.   Right Coronary Artery: Large dominant vessel with 40% mid stenosis.   Left Ventricular Angiogram: LVEF 50%.    Impression: 1. Triple vessel CAD with patent stent mid Circumflex. The LAD is diffusely diseased and is too small for stenting or bypass grafting. I do not think any of the lesions are flow limiting.  2. Normal LV systolic function.    Recommendations: Continue medical management.        Complications:  None. The patient tolerated the procedure well.

## 2011-08-25 NOTE — Interval H&P Note (Signed)
History and Physical Interval Note:  08/25/2011 7:02 AM  Shawn Meza  has presented today for cardiac cath with possible PCI. with the diagnosis of Chest pain  The various methods of treatment have been discussed with the patient and family. After consideration of risks, benefits and other options for treatment, the patient has consented to  Procedure(s): LEFT HEART CATHETERIZATION WITH CORONARY ANGIOGRAM as a surgical intervention .  The patients' history has been reviewed, patient examined, no change in status, stable for surgery.  I have reviewed the patients' chart and labs.  Questions were answered to the patient's satisfaction.     [name]

## 2011-08-31 ENCOUNTER — Telehealth: Payer: Self-pay | Admitting: Cardiovascular Disease

## 2011-08-31 NOTE — Telephone Encounter (Signed)
Reviewed with Dr. Excell Seltzer (DOD) and he recommends pt schedule office visit with Tereso Newcomer, PA to evaluate groin. Spoke with pt and he will come in to see Tereso Newcomer, PA tomorrow at 3:45.

## 2011-08-31 NOTE — Telephone Encounter (Signed)
Left message to call back  

## 2011-08-31 NOTE — Telephone Encounter (Signed)
F/U   Patient returning nurse Meryl Crutch, please try him back again

## 2011-08-31 NOTE — Telephone Encounter (Signed)
Spoke with pt. He reports soreness and some swelling at cath site in left groin. " Felt sick" Sunday night and felt like heart was beating fast. Blood pressure 120/60. He states left leg felt cold on Monday and Tuesday but today it feels warm to touch.  Complains of cramp like feeling in groin area.  Took NTG last night because he was not feeling well.  Was not having chest pain. He felt better after taking NTG. Also took anxiety med last night. States he is feeling better today.

## 2011-08-31 NOTE — Telephone Encounter (Signed)
New problem Pt wants to talk to you about cath he had last Thursday. Please call

## 2011-09-01 ENCOUNTER — Ambulatory Visit (INDEPENDENT_AMBULATORY_CARE_PROVIDER_SITE_OTHER): Payer: Medicare Other | Admitting: Physician Assistant

## 2011-09-01 ENCOUNTER — Encounter: Payer: Self-pay | Admitting: Physician Assistant

## 2011-09-01 DIAGNOSIS — R1030 Lower abdominal pain, unspecified: Secondary | ICD-10-CM

## 2011-09-01 DIAGNOSIS — R5383 Other fatigue: Secondary | ICD-10-CM

## 2011-09-01 DIAGNOSIS — R109 Unspecified abdominal pain: Secondary | ICD-10-CM

## 2011-09-01 DIAGNOSIS — I2510 Atherosclerotic heart disease of native coronary artery without angina pectoris: Secondary | ICD-10-CM

## 2011-09-01 DIAGNOSIS — R5381 Other malaise: Secondary | ICD-10-CM

## 2011-09-01 NOTE — Progress Notes (Signed)
Addended by: Tarri Fuller on: 09/01/2011 05:45 PM   Modules accepted: Orders

## 2011-09-01 NOTE — Assessment & Plan Note (Addendum)
Etiology not clear.  Poor appetite too.  Check CBC as noted and BMET to eval renal fxn post cath.  Increase activity.

## 2011-09-01 NOTE — Patient Instructions (Addendum)
Your physician recommends that you schedule a follow-up appointment in: AS ALREADY SCHEDULED  Your physician has requested that you have a lower extremity arterial DOPPLER DX LEFT GROIN PAIN POST CATH. This test is an ultrasound of the arteries in the legs or arms. It looks at arterial blood flow in the legs and arms. Allow one hour for Lower and Upper Arterial scans. There are no restrictions or special instructions  Your physician recommends that you return for lab work in: TODAY BMET, CBC W/DIFF FATIGUE  Your physician has recommended you make the following change in your medication: DECREASE METOPOLOL TO 12.5 MG TWICE DAILY

## 2011-09-01 NOTE — Assessment & Plan Note (Signed)
Cath site looks ok.  Put he has a lot of pain.  Will set up ultrasound to make sure no pseudoanuerysm.  Check a cbc as well.  Reassurance today.  Follow up with Dr. Verne Carrow as directed or sooner PRN.

## 2011-09-01 NOTE — Assessment & Plan Note (Addendum)
Continue med Rx.  Only taking metoprolol QD.  Change to 1/2 bid.  Follow up with Dr. Verne Carrow as directed.

## 2011-09-01 NOTE — Progress Notes (Signed)
[address] [place], [place]  [address] Phone: [phone] Fax:  [phone]  Date:  09/01/2011   Name:  Shawn Meza       DOB:  06/14/1946 MRN:  [id number]  PCP:  Dr. Milus Glazier Primary Cardiologist:  Dr. Verne Carrow  Primary Electrophysiologist:  None    History of Present Illness: Shawn Meza is a 66 y.o. male who presents for evaluation of groin pain.  He has a history of CAD with admission to [facility] May 2012 with a NSTEMI , now s/p placement of a bare metal stent in the proximal Circumflex artery. He has also been recently diagnosed with colon cancer. His cardiac catheterization was performed by Dr. Gala Romney. He was found to have severe disease in the proximal Circumflex artery as well as his LAD, Ramus and PDA. LV function was normal by LV gram. The circumflex lesion was felt to be the culprit lesion.  He had a bare metal stent put in the Circumflex artery so that he could have one month of dual antiplatelet therapy before his colon surgery. He also has a history of HTN.  He had his colon surgery and was told that his cancer was cured by surgery.  He was put back on his Plavix and ASA.  His coronary disease was treated conservatively given his colon cancer back in May.  He was not a good candidate for CABG at that time due to his active cancer. The plan was to manage his CAD conservatively and when he had completely recovered from his colon procedure, plan repeat cath or stress test in order to discuss best way to treat his multi-vessel disease.    He saw Dr. Verne Carrow 08/18/11 in follow and was set up for re-look cath.  LHC 08/25/11: LAD diff diseased, oLAD 50%, prox to mid 60-70% (no changes), dLAD 90% (1.5 mm), ostial and prox Dx 50% (small), mCFX stent ok, oOM1 40%, dCFX 40%, oRI 60%, mRCA 40%, EF 50%.  LAD was noted to be diffusely diseased and too small for PCI or CABG.  Medical tx was recommended.    Called in with  left groin pain and added on to my schedule.  He was feeling good initially but did more activity last weekend.  Then he noted increased pain in his groin.  Felt like his leg was cold.  He has felt poorly.  Not drinking as much water.  No chest pain.  Felt nervous.  Took xanax and NTG one night.  No back pain.  No pain in left toes.  No discoloration of leg or foot.    Past Medical History  Diagnosis Date  . GERD (gastroesophageal reflux disease)   . Headache   . Hyperlipidemia   . Hypertension   . Nephrolithiasis     hx of  . Transient ischemic attack     hx of  . BPH (benign prostatic hypertrophy)   . CAD (coronary artery disease)   . Colon cancer     Current Outpatient Prescriptions  Medication Sig Dispense Refill  . aspirin 81 MG tablet Take 81 mg by mouth daily.        Marland Kitchen lisinopril (PRINIVIL,ZESTRIL) 5 MG tablet Take 5 mg by mouth daily.      . metoprolol tartrate (LOPRESSOR) 25 MG tablet Take 25 mg by mouth daily.       . nitroGLYCERIN (NITROSTAT) 0.4 MG SL tablet Place 0.4 mg under the tongue every 5 (five)  minutes as needed. Chest pain      . rosuvastatin (CRESTOR) 40 MG tablet Take 40 mg by mouth daily.      . clopidogrel (PLAVIX) 75 MG tablet Take 75 mg by mouth daily.        Marland Kitchen VITAMIN D, CHOLECALCIFEROL, PO Take 1 tablet by mouth daily.         Allergies: Allergies  Allergen Reactions  . Testosterone     REACTION: abd pain    History  Substance Use Topics  . Smoking status: Never Smoker   . Smokeless tobacco: Not on file  . Alcohol Use: No     ROS:  Please see the history of present illness.  All other systems reviewed and negative.   PHYSICAL EXAM: VS:  BP 122/64  Resp 18 Well nourished, well developed, in no acute distress HEENT: normal Neck: no JVD Cardiac:  normal S1, S2; RRR; no murmur Lungs:  clear to auscultation bilaterally, no wheezing, rhonchi or rales Abd: soft, nontender, no hepatomegaly Ext: no edema; left groin very painful to palpation; I  cannot appreciate any pulsatile mass (although he is quite apprehensive); no bruit over bilat FAs;  Left foot appears normal without toe discoloration Vasc: DP/PT 2+ on left  Skin: warm and dry Neuro:  CNs 2-12 intact, no focal abnormalities noted  ASSESSMENT AND PLAN:

## 2011-09-02 ENCOUNTER — Telehealth: Payer: Self-pay | Admitting: *Deleted

## 2011-09-02 LAB — CBC WITH DIFFERENTIAL/PLATELET
Basophils Absolute: 0.1 10*3/uL (ref 0.0–0.1)
Basophils Relative: 0.4 % (ref 0.0–3.0)
Eosinophils Absolute: 0.2 10*3/uL (ref 0.0–0.7)
Eosinophils Relative: 1.9 % (ref 0.0–5.0)
HCT: 44.5 % (ref 39.0–52.0)
Hemoglobin: 15 g/dL (ref 13.0–17.0)
Lymphocytes Relative: 29.7 % (ref 12.0–46.0)
Lymphs Abs: 3.5 10*3/uL (ref 0.7–4.0)
MCHC: 33.7 g/dL (ref 30.0–36.0)
MCV: 93.6 fl (ref 78.0–100.0)
Monocytes Absolute: 1.4 10*3/uL — ABNORMAL HIGH (ref 0.1–1.0)
Monocytes Relative: 12 % (ref 3.0–12.0)
Neutro Abs: 6.6 10*3/uL (ref 1.4–7.7)
Neutrophils Relative %: 56 % (ref 43.0–77.0)
Platelets: 223 10*3/uL (ref 150.0–400.0)
RBC: 4.75 Mil/uL (ref 4.22–5.81)
RDW: 13.9 % (ref 11.5–14.6)
WBC: 11.8 10*3/uL — ABNORMAL HIGH (ref 4.5–10.5)

## 2011-09-02 LAB — BASIC METABOLIC PANEL
BUN: 20 mg/dL (ref 6–23)
CO2: 26 mEq/L (ref 19–32)
Calcium: 9.2 mg/dL (ref 8.4–10.5)
Chloride: 105 mEq/L (ref 96–112)
Creatinine, Ser: 1 mg/dL (ref 0.4–1.5)
GFR: 75.61 mL/min (ref >60.00)
Glucose, Bld: 102 mg/dL — ABNORMAL HIGH (ref 70–99)
Potassium: 4.5 mEq/L (ref 3.5–5.1)
Sodium: 140 mEq/L (ref 135–145)

## 2011-09-02 NOTE — Telephone Encounter (Signed)
I called pt and reviewed results of lab work done yesterday. Pt states his groin pain has improved and he would like to cancel ultrasound of groin.  I told pt I would cancel it.

## 2011-09-05 ENCOUNTER — Encounter: Payer: Medicare Other | Admitting: *Deleted

## 2011-09-13 ENCOUNTER — Telehealth: Payer: Self-pay | Admitting: Cardiovascular Disease

## 2011-09-13 NOTE — Telephone Encounter (Signed)
Spoke with Eunice Blase at [facility]. Pt to have MRI of knee done.  He has a history of stent placement. I told her it was OK for pt to have MRI done.

## 2011-09-13 NOTE — Telephone Encounter (Signed)
New msg: [facility] calling wanting to get surgical clearance for MRI. Please return to discuss further.

## 2011-09-16 ENCOUNTER — Telehealth: Payer: Self-pay | Admitting: Cardiovascular Disease

## 2011-09-16 NOTE — Telephone Encounter (Signed)
Patient called because he said he supposed to have given a card when he had a stent placement on May 2012. Patient did not get one when he was D/c from the hospital, so he like to get one now for the  Future. Patient was made aware that he  should have gotten the cad on D/c from the hospital and the hospital don't keep them there. He  Is incisting to have one now.

## 2011-09-16 NOTE — Telephone Encounter (Signed)
New msg Pt wants to discuss about his stents. He has a question. Please call

## 2011-09-21 ENCOUNTER — Encounter: Payer: Self-pay | Admitting: Cardiovascular Disease

## 2011-09-21 NOTE — Telephone Encounter (Signed)
I spoke with Clydie Braun in cath lab and they will mail card to pt. I spoke with pt and gave him this information.  I also made pt aware that Dr. Clifton James has spoken with Dr. Thurston Hole and pt has been cleared for surgery and that letter has been sent to Dr. Sherene Sires office

## 2011-09-23 ENCOUNTER — Ambulatory Visit: Payer: Medicare Other | Admitting: Cardiovascular Disease

## 2011-10-06 ENCOUNTER — Encounter (HOSPITAL_BASED_OUTPATIENT_CLINIC_OR_DEPARTMENT_OTHER): Payer: Self-pay | Admitting: *Deleted

## 2011-10-06 ENCOUNTER — Encounter (HOSPITAL_BASED_OUTPATIENT_CLINIC_OR_DEPARTMENT_OTHER)
Admission: RE | Admit: 2011-10-06 | Discharge: 2011-10-06 | Disposition: A | Discharge status: Home / Self Care | Payer: Medicare Other | Source: Ambulatory Visit | Attending: Orthopedic Surgery | Admitting: Orthopedic Surgery

## 2011-10-06 NOTE — Progress Notes (Signed)
To come in for bmet Pt has cardiac clearance from dr Sanjuana Kava

## 2011-10-07 ENCOUNTER — Encounter (HOSPITAL_BASED_OUTPATIENT_CLINIC_OR_DEPARTMENT_OTHER)
Admission: RE | Admit: 2011-10-07 | Discharge: 2011-10-07 | Disposition: A | Discharge status: Home / Self Care | Payer: Medicare Other | Source: Ambulatory Visit | Attending: Orthopedic Surgery | Admitting: Orthopedic Surgery

## 2011-10-07 LAB — BASIC METABOLIC PANEL
BUN: 15 mg/dL (ref 6–23)
CO2: 24 mEq/L (ref 19–32)
Calcium: 9.3 mg/dL (ref 8.4–10.5)
Chloride: 108 mEq/L (ref 96–112)
Creatinine, Ser: 0.86 mg/dL (ref 0.50–1.35)
GFR calc Af Amer: >90 mL/min (ref >90)
GFR calc non Af Amer: 88 mL/min — ABNORMAL LOW (ref >90)
Glucose, Bld: 109 mg/dL — ABNORMAL HIGH (ref 70–99)
Potassium: 4.4 mEq/L (ref 3.5–5.1)
Sodium: 143 mEq/L (ref 135–145)

## 2011-10-10 ENCOUNTER — Other Ambulatory Visit: Payer: Self-pay | Admitting: Physician Assistant

## 2011-10-10 NOTE — H&P (Signed)
Shawn Meza is an 66 y.o. male.   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 is a 65 year old seen for follow-up from his persistent left knee pain. He had a MRI of the left knee on 09/16/11 that showed a medial meniscus tear with moderate degenerative changes. We injected his knee prior to this with temporarily relief. 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 Current medications: Plavix, Crestor, Nitro stat, Lopressor, Lisinopril and aspirin. No known drug allergies. He has significant cardiac history with metal stent placed in 5/12 and he had a cardiac catheterization in 1/13 that showed residual significant coronary artery disease. Dr. McAlhany has reviewed case and verbally okayed him for any arthroscopic surgery but we do not have this in writing. [name] had significant pain in the right foot 2nd web space and the longer he stands the more pain he has in this region. No specific injury.   Past Medical History  Diagnosis Date  . GERD (gastroesophageal reflux disease)   . Headache   . Hyperlipidemia   . Hypertension   . Nephrolithiasis     hx of  . Transient ischemic attack     hx of  . BPH (benign prostatic hypertrophy)   . CAD (coronary artery disease)   . Colon cancer     Past Surgical History  Procedure Date  . Transurethral resection of prostate   . Cardiac catheterization 5/12,1/13    Family History  Problem Relation Age of Onset  . Coronary artery disease Other     family hx of male 1st degree relative ,50  . Hyperlipidemia      family hx of  . Hypertension      family hx of  . Arthritis      family hx of   Social History:  reports that he has never smoked. He does not have any smokeless tobacco history on file. He reports that he does not drink alcohol or use illicit drugs.  Allergies:  Allergies  Allergen Reactions  . Testosterone     REACTION: abd  pain    Medications Prior to Admission  Medication Sig Dispense Refill  . aspirin 81 MG tablet Take 81 mg by mouth daily.        . clopidogrel (PLAVIX) 75 MG tablet Take 75 mg by mouth daily.        . lisinopril (PRINIVIL,ZESTRIL) 5 MG tablet Take 5 mg by mouth daily.      . metoprolol tartrate (LOPRESSOR) 25 MG tablet Take 0.5 tablets (12.5 mg total) by mouth 2 (two) times daily.      . nitroGLYCERIN (NITROSTAT) 0.4 MG SL tablet Place 0.4 mg under the tongue every 5 (five) minutes as needed. Chest pain      . rosuvastatin (CRESTOR) 40 MG tablet Take 40 mg by mouth daily.      . VITAMIN D, CHOLECALCIFEROL, PO Take 1 tablet by mouth daily.        No current facility-administered medications on file as of 10/10/2011.    No results found for this or any previous visit (from the past 48 hour(s)). No results found.  Review of Systems  Constitutional: Negative.   HENT: Negative.   Eyes: Negative.   Respiratory: Negative.   Cardiovascular: Negative.   Gastrointestinal: Negative.   Genitourinary: Negative.   Musculoskeletal:       Right foot   and left knee pain  Skin: Negative.   Neurological: Negative.   Endo/Heme/Allergies: Negative.   Psychiatric/Behavioral: Negative.     There were no vitals taken for this visit. Physical Exam  Constitutional: He is oriented to person, place, and time. He appears well-developed and well-nourished.  HENT:  Head: Normocephalic and atraumatic.  Mouth/Throat: Oropharynx is clear and moist.  Eyes: EOM are normal. Pupils are equal, round, and reactive to light.  Neck: Neck supple.  Cardiovascular: Normal rate.   Respiratory: Effort normal.  GI: Soft.  Genitourinary:       Not pertinent to current symptomatology therefore not examined.  Musculoskeletal:       Examination of  his left knee reveals pain over the medial joint line positive medial McMurray's minimal swelling range of motion 0-120 degrees knee is stable with normal patella tracking.  Exam of the right knee reveals full range of motion without pain swelling weakness or instability. Exam of the right foot reveals pain in the 2nd web space positive pain on metatarsal squeeze the foot is well aligned and plantigrade and there is no deformity of the toes. Exam of the left foot reveals no pain swelling or deformity. Vascular exam: pulses 2+ and symmetric.  Neurological: He is alert and oriented to person, place, and time.  Skin: Skin is warm and dry.  Psychiatric: He has a normal mood and affect. His behavior is normal. Judgment and thought content normal.     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 OTHER TESTICULAR HYPOFUNCTION  . HYPERLIPIDEMIA  . HYPERTENSION  . ATHEROSCLEROSIS OF OTHER SPECIFIED ARTERIES  . 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  . GERD  . BENIGN PROSTATIC HYPERTROPHY  . HEADACHE  . TRANSIENT ISCHEMIC ATTACK, HX OF  . NEPHROLITHIASIS, HX OF  . BENIGN PROSTATIC HYPERTROPHY, HX OF, S/P TURP  . CAD (coronary artery disease)  . Groin pain  . Fatigue   Plan I  talked to him about this in detail.  He may need left knee arthroscopy and right foot 2nd web space Morton's neuroma injection but he has beeen cleared preoperatively due to stent placement and cardiac disease. Discussed risks benefits and possible complications of the surgery and he understands this completely.   Jaedin Trumbo J 10/10/2011, 2:43 PM    

## 2011-10-11 ENCOUNTER — Encounter (HOSPITAL_BASED_OUTPATIENT_CLINIC_OR_DEPARTMENT_OTHER): Payer: Self-pay | Admitting: Anesthesiology

## 2011-10-11 ENCOUNTER — Encounter (HOSPITAL_BASED_OUTPATIENT_CLINIC_OR_DEPARTMENT_OTHER)
Admission: RE | Disposition: A | Discharge status: Home / Self Care | Payer: Self-pay | Source: Ambulatory Visit | Attending: Orthopedic Surgery

## 2011-10-11 ENCOUNTER — Ambulatory Visit (HOSPITAL_BASED_OUTPATIENT_CLINIC_OR_DEPARTMENT_OTHER): Payer: Medicare Other | Admitting: Anesthesiology

## 2011-10-11 ENCOUNTER — Ambulatory Visit (HOSPITAL_BASED_OUTPATIENT_CLINIC_OR_DEPARTMENT_OTHER)
Admission: RE | Admit: 2011-10-11 | Discharge: 2011-10-11 | Disposition: A | Discharge status: Home / Self Care | Payer: Medicare Other | Source: Ambulatory Visit | Attending: Orthopedic Surgery | Admitting: Orthopedic Surgery

## 2011-10-11 ENCOUNTER — Encounter (HOSPITAL_BASED_OUTPATIENT_CLINIC_OR_DEPARTMENT_OTHER): Payer: Self-pay

## 2011-10-11 ENCOUNTER — Encounter (HOSPITAL_BASED_OUTPATIENT_CLINIC_OR_DEPARTMENT_OTHER): Payer: Self-pay | Admitting: Physician Assistant

## 2011-10-11 DIAGNOSIS — I739 Peripheral vascular disease, unspecified: Secondary | ICD-10-CM | POA: Insufficient documentation

## 2011-10-11 DIAGNOSIS — M23359 Other meniscus derangements, posterior horn of lateral meniscus, unspecified knee: Secondary | ICD-10-CM | POA: Insufficient documentation

## 2011-10-11 DIAGNOSIS — S83249A Other tear of medial meniscus, current injury, unspecified knee, initial encounter: Secondary | ICD-10-CM | POA: Diagnosis present

## 2011-10-11 DIAGNOSIS — K219 Gastro-esophageal reflux disease without esophagitis: Secondary | ICD-10-CM | POA: Insufficient documentation

## 2011-10-11 DIAGNOSIS — R51 Headache: Secondary | ICD-10-CM | POA: Insufficient documentation

## 2011-10-11 DIAGNOSIS — G5760 Lesion of plantar nerve, unspecified lower limb: Secondary | ICD-10-CM | POA: Insufficient documentation

## 2011-10-11 DIAGNOSIS — Z8673 Personal history of transient ischemic attack (TIA), and cerebral infarction without residual deficits: Secondary | ICD-10-CM | POA: Insufficient documentation

## 2011-10-11 DIAGNOSIS — M23329 Other meniscus derangements, posterior horn of medial meniscus, unspecified knee: Secondary | ICD-10-CM | POA: Insufficient documentation

## 2011-10-11 DIAGNOSIS — Z79899 Other long term (current) drug therapy: Secondary | ICD-10-CM | POA: Insufficient documentation

## 2011-10-11 DIAGNOSIS — D3613 Benign neoplasm of peripheral nerves and autonomic nervous system of lower limb, including hip: Secondary | ICD-10-CM

## 2011-10-11 DIAGNOSIS — M2240 Chondromalacia patellae, unspecified knee: Secondary | ICD-10-CM | POA: Insufficient documentation

## 2011-10-11 DIAGNOSIS — I2510 Atherosclerotic heart disease of native coronary artery without angina pectoris: Secondary | ICD-10-CM | POA: Insufficient documentation

## 2011-10-11 DIAGNOSIS — Z01812 Encounter for preprocedural laboratory examination: Secondary | ICD-10-CM | POA: Insufficient documentation

## 2011-10-11 DIAGNOSIS — I10 Essential (primary) hypertension: Secondary | ICD-10-CM | POA: Insufficient documentation

## 2011-10-11 HISTORY — DX: Other tear of medial meniscus, current injury, unspecified knee, initial encounter: S83.249A

## 2011-10-11 HISTORY — PX: KNEE ARTHROSCOPY: SHX127

## 2011-10-11 HISTORY — DX: Benign neoplasm of peripheral nerves and autonomic nervous system of lower limb, including hip: D36.13

## 2011-10-11 HISTORY — PX: STERIOD INJECTION: SHX5046

## 2011-10-11 SURGERY — ARTHROSCOPY, KNEE
Anesthesia: Choice | Site: Toe | Laterality: Right

## 2011-10-11 MED ORDER — BUPIVACAINE HCL (PF) 0.25 % IJ SOLN
INTRAMUSCULAR | Status: DC | PRN
  Administered 2011-10-11: 20 mL

## 2011-10-11 MED ORDER — SODIUM CHLORIDE 0.9 % IR SOLN
Status: DC | PRN
  Administered 2011-10-11: 13:00:00

## 2011-10-11 MED ORDER — MIDAZOLAM HCL 2 MG/2ML IJ SOLN
0.5000 mg | INTRAMUSCULAR | Status: DC | PRN
  Administered 2011-10-11: 2 mg via INTRAVENOUS

## 2011-10-11 MED ORDER — POVIDONE-IODINE 7.5 % EX SOLN: Freq: Once | CUTANEOUS | Status: DC

## 2011-10-11 MED ORDER — LIDOCAINE HCL (CARDIAC) 20 MG/ML IV SOLN
INTRAVENOUS | Status: DC | PRN
  Administered 2011-10-11: 100 mg via INTRAVENOUS

## 2011-10-11 MED ORDER — DEXAMETHASONE SODIUM PHOSPHATE 4 MG/ML IJ SOLN
INTRAMUSCULAR | Status: DC | PRN
  Administered 2011-10-11: 4 mg via INTRAVENOUS

## 2011-10-11 MED ORDER — BUPIVACAINE HCL (PF) 0.25 % IJ SOLN
INTRAMUSCULAR | Status: DC | PRN
  Administered 2011-10-11: 1 mL

## 2011-10-11 MED ORDER — METOCLOPRAMIDE HCL 5 MG/ML IJ SOLN
INTRAMUSCULAR | Status: DC | PRN
  Administered 2011-10-11: 10 mg via INTRAVENOUS

## 2011-10-11 MED ORDER — METHYLPREDNISOLONE ACETATE PF 40 MG/ML IJ SUSP
INTRAMUSCULAR | Status: DC | PRN
  Administered 2011-10-11: 40 mg via INTRA_ARTICULAR

## 2011-10-11 MED ORDER — CHLORHEXIDINE GLUCONATE 4 % EX LIQD: 60.0000 mL | Freq: Once | CUTANEOUS | Status: DC

## 2011-10-11 MED ORDER — CEFAZOLIN SODIUM-DEXTROSE 2-3 GM-% IV SOLR
2.0000 g | INTRAVENOUS | Status: AC
  Administered 2011-10-11: 2 g via INTRAVENOUS

## 2011-10-11 MED ORDER — METOCLOPRAMIDE HCL 5 MG/ML IJ SOLN: 10.0000 mg | Freq: Once | INTRAMUSCULAR | Status: DC | PRN

## 2011-10-11 MED ORDER — LACTATED RINGERS IV SOLN
INTRAVENOUS | Status: DC
  Administered 2011-10-11: 12:00:00 via INTRAVENOUS

## 2011-10-11 MED ORDER — LIDOCAINE-EPINEPHRINE 1.5-1:200000 % IJ SOLN
INTRAMUSCULAR | Status: DC | PRN
  Administered 2011-10-11: 20 mL via INTRADERMAL

## 2011-10-11 MED ORDER — FENTANYL CITRATE 0.05 MG/ML IJ SOLN
50.0000 ug | INTRAMUSCULAR | Status: DC | PRN
  Administered 2011-10-11: 100 ug via INTRAVENOUS

## 2011-10-11 MED ORDER — PROPOFOL 10 MG/ML IV EMUL
INTRAVENOUS | Status: DC | PRN
  Administered 2011-10-11: 250 mg via INTRAVENOUS

## 2011-10-11 MED ORDER — ONDANSETRON HCL 4 MG/2ML IJ SOLN
INTRAMUSCULAR | Status: DC | PRN
  Administered 2011-10-11: 4 mg via INTRAVENOUS

## 2011-10-11 MED ORDER — FENTANYL CITRATE 0.05 MG/ML IJ SOLN: 25.0000 ug | INTRAMUSCULAR | Status: DC | PRN

## 2011-10-11 MED ORDER — LACTATED RINGERS IV SOLN
INTRAVENOUS | Status: DC
  Administered 2011-10-11 (×2): via INTRAVENOUS

## 2011-10-11 MED ORDER — HYDROCODONE-ACETAMINOPHEN 5-325 MG PO TABS: ORAL_TABLET | ORAL | Status: DC

## 2011-10-11 SURGICAL SUPPLY — 56 items
BANDAGE ELASTIC 6 VELCRO ST LF (GAUZE/BANDAGES/DRESSINGS) ×3 IMPLANT
BANDAGE ESMARK 6X9 LF (GAUZE/BANDAGES/DRESSINGS) IMPLANT
BENZOIN TINCTURE PRP APPL 2/3 (GAUZE/BANDAGES/DRESSINGS) IMPLANT
BLADE CUTTER GATOR 3.5 (BLADE) ×3 IMPLANT
BLADE GREAT WHITE 4.2 (BLADE) ×3 IMPLANT
BLADE SURG 15 STRL LF DISP TIS (BLADE) IMPLANT
BLADE SURG 15 STRL SS (BLADE)
BNDG COHESIVE 4X5 TAN STRL (GAUZE/BANDAGES/DRESSINGS) IMPLANT
BNDG ESMARK 6X9 LF (GAUZE/BANDAGES/DRESSINGS)
CANISTER OMNI JUG 16 LITER (MISCELLANEOUS) ×3 IMPLANT
CANISTER SUCTION 2500CC (MISCELLANEOUS) IMPLANT
DRAPE ARTHROSCOPY W/POUCH 90 (DRAPES) ×3 IMPLANT
DURAPREP 26ML APPLICATOR (WOUND CARE) ×3 IMPLANT
GAUZE XEROFORM 1X8 LF (GAUZE/BANDAGES/DRESSINGS) ×3 IMPLANT
GLOVE BIO SURGEON STRL SZ7 (GLOVE) ×3 IMPLANT
GLOVE BIO SURGEON STRL SZ7.5 (GLOVE) ×3 IMPLANT
GLOVE BIOGEL PI IND STRL 7.0 (GLOVE) ×2 IMPLANT
GLOVE BIOGEL PI IND STRL 7.5 (GLOVE) ×2 IMPLANT
GLOVE BIOGEL PI IND STRL 8 (GLOVE) ×2 IMPLANT
GLOVE BIOGEL PI INDICATOR 7.0 (GLOVE) ×1
GLOVE BIOGEL PI INDICATOR 7.5 (GLOVE) ×1
GLOVE BIOGEL PI INDICATOR 8 (GLOVE) ×1
GLOVE ECLIPSE 6.5 STRL STRAW (GLOVE) ×3 IMPLANT
GLOVE SS BIOGEL STRL SZ 7.5 (GLOVE) ×2 IMPLANT
GLOVE SUPERSENSE BIOGEL SZ 7.5 (GLOVE) ×1
GOWN PREVENTION PLUS XLARGE (GOWN DISPOSABLE) ×9 IMPLANT
HOLDER KNEE FOAM BLUE (MISCELLANEOUS) ×3 IMPLANT
KNEE WRAP E Z 3 GEL PACK (MISCELLANEOUS) ×3 IMPLANT
KWIRE 4.0 X .062IN (WIRE) IMPLANT
NDL SAFETY ECLIPSE 18X1.5 (NEEDLE) IMPLANT
NEEDLE HYPO 18GX1.5 SHARP (NEEDLE)
NEEDLE HYPO 22GX1.5 SAFETY (NEEDLE) IMPLANT
PACK ARTHROSCOPY DSU (CUSTOM PROCEDURE TRAY) ×3 IMPLANT
PACK BASIN DAY SURGERY FS (CUSTOM PROCEDURE TRAY) IMPLANT
PAD ALCOHOL SWAB (MISCELLANEOUS) ×6 IMPLANT
SET ARTHROSCOPY TUBING (MISCELLANEOUS) ×1
SET ARTHROSCOPY TUBING LN (MISCELLANEOUS) ×2 IMPLANT
SPONGE GAUZE 4X4 12PLY (GAUZE/BANDAGES/DRESSINGS) ×3 IMPLANT
STRIP CLOSURE SKIN 1/2X4 (GAUZE/BANDAGES/DRESSINGS) IMPLANT
SUCTION FRAZIER TIP 10 FR DISP (SUCTIONS) IMPLANT
SUT ETHILON 4 0 PS 2 18 (SUTURE) ×3 IMPLANT
SUT FIBERWIRE #2 38 T-5 BLUE (SUTURE)
SUT PDS AB 0 CT 36 (SUTURE) IMPLANT
SUT PROLENE 3 0 PS 2 (SUTURE) IMPLANT
SUT VIC AB 0 CT1 18XCR BRD 8 (SUTURE) IMPLANT
SUT VIC AB 0 CT1 8-18 (SUTURE)
SUT VIC AB 2-0 CT1 27 (SUTURE)
SUT VIC AB 2-0 CT1 TAPERPNT 27 (SUTURE) IMPLANT
SUT VIC AB 3-0 PS1 18 (SUTURE)
SUT VIC AB 3-0 PS1 18XBRD (SUTURE) IMPLANT
SUTURE FIBERWR #2 38 T-5 BLUE (SUTURE) IMPLANT
SYR 20CC LL (SYRINGE) IMPLANT
SYR 5ML LL (SYRINGE) IMPLANT
TOWEL OR 17X24 6PK STRL BLUE (TOWEL DISPOSABLE) ×3 IMPLANT
WAND STAR VAC 90 (SURGICAL WAND) ×3 IMPLANT
WATER STERILE IRR 1000ML POUR (IV SOLUTION) ×3 IMPLANT

## 2011-10-11 NOTE — Interval H&P Note (Signed)
History and Physical Interval Note:  10/11/2011 11:58 AM  Shawn Meza  has presented today for surgery, with the diagnosis of medial meniscus tear  The various methods of treatment have been discussed with the patient and family. After consideration of risks, benefits and other options for treatment, the patient has consented to  Procedure(s) (LRB): ARTHROSCOPY KNEE (Left) and injection in R second toe as a surgical intervention .  The patients' history has been reviewed, patient examined, no change in status, stable for surgery.  I have reviewed the patients' chart and labs.  Questions were answered to the patient's satisfaction.     Salvatore Marvel A

## 2011-10-11 NOTE — Discharge Instructions (Signed)
[facility]  [address] [place], [place] [address] [phone]   Post Anesthesia Home Care Instructions  Activity: Get plenty of rest for the remainder of the day. A responsible adult should stay with you for 24 hours following the procedure.  For the next 24 hours, DO NOT: -Drive a car -Operate machinery -Drink alcoholic beverages -Take any medication unless instructed by your physician -Make any legal decisions or sign important papers.  Meals: Start with liquid foods such as gelatin or soup. Progress to regular foods as tolerated. Avoid greasy, spicy, heavy foods. If nausea and/or vomiting occur, drink only clear liquids until the nausea and/or vomiting subsides. Call your physician if vomiting continues.  Special Instructions/Symptoms: Your throat may feel dry or sore from the anesthesia or the breathing tube placed in your throat during surgery. If this causes discomfort, gargle with warm salt water. The discomfort should disappear within 24 hours.   

## 2011-10-11 NOTE — Anesthesia Procedure Notes (Addendum)
Procedure Name: LMA Insertion Date/Time: 10/11/2011 12:18 PM Performed by: Zenia Resides D Pre-anesthesia Checklist: Patient identified, Emergency Drugs available, Suction available and Patient being monitored Patient Re-evaluated:Patient Re-evaluated prior to inductionOxygen Delivery Method: Circle System Utilized Preoxygenation: Pre-oxygenation with 100% oxygen Intubation Type: IV induction Ventilation: Mask ventilation without difficulty LMA: LMA with gastric port inserted LMA Size: 4.0 and 5.0 Number of attempts: 1 Placement Confirmation: positive ETCO2 and breath sounds checked- equal and bilateral Tube secured with: Tape Dental Injury: Teeth and Oropharynx as per pre-operative assessment     I was consulted by the surgeon to provide a pre-operative intra-articular injection. Patient was identified and a time out was performed. Intravenous sedation performed by RN in attendance. The operative knee was prepped and draped in a sterile fashion using betadine spray. Using sterile technique, 10 cc of Marcaine 0.25% was infiltrated over the 2 inferior portals of the operative knee using a 25g. 1.5 inch needle. An 18g. 1.5 inch needle was then used to inject 20 cc of 1.5% lidocaine with 1:200,000 epinephrine into the operative knee. The patient tolerated the procedure well.  Procedure performed personally by Janetta Hora. Gelene Mink, MD  Time [phone]  Time end: 1155

## 2011-10-11 NOTE — Progress Notes (Signed)
Assisted Dr. Frederick with left, knee block. Side rails up, monitors on throughout procedure. See vital signs in flow sheet. Tolerated Procedure well. 

## 2011-10-11 NOTE — Brief Op Note (Signed)
10/11/2011  12:48 PM  PATIENT:  Shawn Meza  66 y.o. male  PRE-OPERATIVE DIAGNOSIS:  medial meniscus tear, right foot second web space morton's neuroma  POST-OPERATIVE DIAGNOSIS: same  PROCEDURE:  Procedure(s) (LRB): ARTHROSCOPY KNEE (Left)  Right foot mortons neuroma injection  SURGEON:  Surgeon(s) and Role:    * Nilda Simmer, MD - Primary  PHYSICIAN ASSISTANT: Janace Litten OP-A  ASSISTANTS:  Janace Litten OP-A   ANESTHESIA:   general  EBL:     BLOOD ADMINISTERED:none  DRAINS: none   LOCAL MEDICATIONS USED:  NONE  SPECIMEN:  No Specimen  DISPOSITION OF SPECIMEN:  N/A  COUNTS:  YES  TOURNIQUET:  * No tourniquets in log *  DICTATION: .Other Dictation: Dictation Number [id number]  PLAN OF CARE: Discharge to home after PACU  PATIENT DISPOSITION:  PACU - hemodynamically stable.   Delay start of Pharmacological VTE agent (>24hrs) due to surgical blood loss or risk of bleeding: not applicable

## 2011-10-11 NOTE — Anesthesia Postprocedure Evaluation (Signed)
Anesthesia Post Note  Patient: Shawn Meza  Procedure(s) Performed: Procedure(s) (LRB): ARTHROSCOPY KNEE (Left) STEROID INJECTION (Right)  Anesthesia type: General  Patient location: PACU  Post pain: Pain level controlled  Post assessment: Patient's Cardiovascular Status Stable  Last Vitals:  Filed Vitals:   10/11/11 1315  BP: 114/60  Pulse: 69  Temp:   Resp: 15    Post vital signs: Reviewed and stable  Level of consciousness: alert  Complications: No apparent anesthesia complications

## 2011-10-11 NOTE — H&P (View-Only) (Signed)
Shawn Meza is an 66 y.o. male.   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 He had a MRI of the left knee on 09/16/11 that showed a medial meniscus tear with moderate degenerative changes. We injected his knee prior to this with temporarily relief. 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 Current medications: Plavix, Crestor, Nitro stat, Lopressor, Lisinopril and aspirin. No known drug allergies. He has significant cardiac history with metal stent placed in 5/12 and he had a cardiac catheterization in 1/13 that showed residual significant coronary artery disease. Dr. Clifton Meza has reviewed case and verbally okayed him for any arthroscopic surgery but we do not have this in writing. [name] had significant pain in the right foot 2nd web space and the longer he stands the more pain he has in this region. No specific injury.   Past Medical History  Diagnosis Date  . GERD (gastroesophageal reflux disease)   . Headache   . Hyperlipidemia   . Hypertension   . Nephrolithiasis     hx of  . Transient ischemic attack     hx of  . BPH (benign prostatic hypertrophy)   . CAD (coronary artery disease)   . Colon cancer     Past Surgical History  Procedure Date  . Transurethral resection of prostate   . Cardiac catheterization 5/12,1/13    Family History  Problem Relation Age of Onset  . Coronary artery disease Other     family hx of male 1st degree relative ,3  . Hyperlipidemia      family hx of  . Hypertension      family hx of  . Arthritis      family hx of   Social History:  reports that he has never smoked. He does not have any smokeless tobacco history on file. He reports that he does not drink alcohol or use illicit drugs.  Allergies:  Allergies  Allergen Reactions  . Testosterone     REACTION: abd  pain    Medications Prior to Admission  Medication Sig Dispense Refill  . aspirin 81 MG tablet Take 81 mg by mouth daily.        . clopidogrel (PLAVIX) 75 MG tablet Take 75 mg by mouth daily.        Marland Kitchen lisinopril (PRINIVIL,ZESTRIL) 5 MG tablet Take 5 mg by mouth daily.      . metoprolol tartrate (LOPRESSOR) 25 MG tablet Take 0.5 tablets (12.5 mg total) by mouth 2 (two) times daily.      . nitroGLYCERIN (NITROSTAT) 0.4 MG SL tablet Place 0.4 mg under the tongue every 5 (five) minutes as needed. Chest pain      . rosuvastatin (CRESTOR) 40 MG tablet Take 40 mg by mouth daily.      Marland Kitchen VITAMIN D, CHOLECALCIFEROL, PO Take 1 tablet by mouth daily.        No current facility-administered medications on file as of 10/10/2011.    No results found for this or any previous visit (from the past 48 hour(s)). No results found.  Review of Systems  Constitutional: Negative.   HENT: Negative.   Eyes: Negative.   Respiratory: Negative.   Cardiovascular: Negative.   Gastrointestinal: Negative.   Genitourinary: Negative.   Musculoskeletal:       Right foot  and left knee pain  Skin: Negative.   Neurological: Negative.   Endo/Heme/Allergies: Negative.   Psychiatric/Behavioral: Negative.     There were no vitals taken for this visit. Physical Exam  Constitutional: He is oriented to person, place, and time. He appears well-developed and well-nourished.  HENT:  Head: Normocephalic and atraumatic.  Mouth/Throat: Oropharynx is clear and moist.  Eyes: EOM are normal. Pupils are equal, round, and reactive to light.  Neck: Neck supple.  Cardiovascular: Normal rate.   Respiratory: Effort normal.  GI: Soft.  Genitourinary:       Not pertinent to current symptomatology therefore not examined.  Musculoskeletal:       Examination of  his left knee reveals pain over the medial joint line positive medial McMurray's minimal swelling range of motion 0-120 degrees knee is stable with normal patella tracking.  Exam of the right knee reveals full range of motion without pain swelling weakness or instability. Exam of the right foot reveals pain in the 2nd web space positive pain on metatarsal squeeze the foot is well aligned and plantigrade and there is no deformity of the toes. Exam of the left foot reveals no pain swelling or deformity. Vascular exam: pulses 2+ and symmetric.  Neurological: He is alert and oriented to person, place, and time.  Skin: Skin is warm and dry.  Psychiatric: He has a normal mood and affect. His behavior is normal. Judgment and thought content normal.     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 OTHER TESTICULAR HYPOFUNCTION  . HYPERLIPIDEMIA  . HYPERTENSION  . ATHEROSCLEROSIS OF OTHER SPECIFIED ARTERIES  . 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  . GERD  . BENIGN PROSTATIC HYPERTROPHY  . HEADACHE  . TRANSIENT ISCHEMIC ATTACK, HX OF  . NEPHROLITHIASIS, HX OF  . BENIGN PROSTATIC HYPERTROPHY, HX OF, S/P TURP  . CAD (coronary artery disease)  . Groin pain  . Fatigue   Plan I  talked to him about this in detail.  He may need left knee arthroscopy and right foot 2nd web space Morton's neuroma injection but he has beeen cleared preoperatively due to stent placement and cardiac disease. Discussed risks benefits and possible complications of the surgery and he understands this completely.   Shawn Meza J 10/10/2011, 2:43 PM

## 2011-10-11 NOTE — Anesthesia Preprocedure Evaluation (Signed)
Anesthesia Evaluation  Patient identified by MRN, date of birth, ID band Patient awake    Reviewed: Allergy & Precautions, H&P , NPO status , Patient's Chart, lab work & pertinent test results, reviewed documented beta blocker date and time   Airway Mallampati: II TM Distance: >3 FB Neck ROM: full    Dental   Pulmonary neg pulmonary ROS,          Cardiovascular hypertension, On Medications and On Home Beta Blockers + CAD and + Peripheral Vascular Disease     Neuro/Psych  Headaches, TIAnegative psych ROS   GI/Hepatic negative GI ROS, Neg liver ROS, GERD-  Medicated and Controlled,  Endo/Other  negative endocrine ROS  Renal/GU negative Renal ROS  negative genitourinary   Musculoskeletal   Abdominal   Peds  Hematology negative hematology ROS (+)   Anesthesia Other Findings See surgeon's H&P   Reproductive/Obstetrics negative OB ROS                           Anesthesia Physical Anesthesia Plan  ASA: III  Anesthesia Plan: General   Post-op Pain Management:    Induction: Intravenous  Airway Management Planned: LMA  Additional Equipment:   Intra-op Plan:   Post-operative Plan: Extubation in OR  Informed Consent: I have reviewed the patients History and Physical, chart, labs and discussed the procedure including the risks, benefits and alternatives for the proposed anesthesia with the patient or authorized representative who has indicated his/her understanding and acceptance.     Plan Discussed with: CRNA and Surgeon  Anesthesia Plan Comments:         Anesthesia Quick Evaluation

## 2011-10-11 NOTE — Transfer of Care (Signed)
Immediate Anesthesia Transfer of Care Note  Patient: Shawn Meza  Procedure(s) Performed: Procedure(s) (LRB): ARTHROSCOPY KNEE (Left) STEROID INJECTION (Right)  Patient Location: PACU  Anesthesia Type: General and Regional  Level of Consciousness: awake, alert  and oriented  Airway & Oxygen Therapy: Patient Spontanous Breathing and Patient connected to nasal cannula oxygen  Post-op Assessment: Report given to PACU RN and Post -op Vital signs reviewed and stable  Post vital signs: Reviewed and stable  Complications: No apparent anesthesia complications

## 2011-10-12 ENCOUNTER — Encounter (HOSPITAL_BASED_OUTPATIENT_CLINIC_OR_DEPARTMENT_OTHER): Payer: Self-pay | Admitting: Orthopedic Surgery

## 2011-10-12 LAB — POCT HEMOGLOBIN-HEMACUE: Hemoglobin: 14.2 g/dL (ref 13.0–17.0)

## 2011-10-12 NOTE — Progress Notes (Signed)
Roe Coombs was wonderful.  Told jokes and used Lidocaine for IV.  Best IV pt ever had. Dr Gelene Mink as wonderful.  Loved his block and felt very comfortable with him.'Great Dr" Amy in PACU never left his side and could not have been nicer to him. Never had better care in his life.

## 2011-10-12 NOTE — Op Note (Signed)
NAME:  BEREL, NAJJAR NO.:  [id number]  MEDICAL RECORD NO.:  [id number]  LOCATION:  [facility]                         FACILITY:  [facility]  PHYSICIAN:  Elana Alm. Thurston Hole, M.D. DATE OF BIRTH:  21-Sep-1945  DATE OF PROCEDURE:  10/11/2011 DATE OF DISCHARGE:  08/25/2011                              OPERATIVE REPORT   PREOPERATIVE DIAGNOSES: 1. 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 2. 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  POSTOPERATIVE DIAGNOSES: 1. 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 2. Right foot second webs pace Morton neuroma.  PROCEDURE: 1. Left knee EUA followed by arthroscopic partial medial and lateral     meniscectomies with chondroplasty. 2. Right foot second web space Morton neuroma cortisone injection.  SURGEON:  Elana Alm. Thurston Hole, MD  ASSISTANT:  Janace Litten, OPA  ANESTHESIA:  General.  OPERATIVE TIME:  30 minutes.  COMPLICATIONS:  None.  INDICATION FOR PROCEDURE:  Mr. Raybourn is a 66 year old gentleman who has had painful increasing left knee pain over the past 6 months with exam and MRI documenting meniscal tearing with chondromalacia.  He has failed conservative care and is now to undergo arthroscopy.  He also had a painful right foot second web space Morton neuroma and is now to undergo injection under the same anesthetic.  DESCRIPTION:  Mr. Cryder was brought to the operating room on October 11, 2011 after a left knee block had been placed in the holding room by Anesthesia.  He was placed on the operative table in supine position. After being placed under general anesthesia, his left knee was examined. He had full range of motion, knee was stable, and ligamentous exam with normal patellar tracking.  Both the left leg and the right foot were identified, time-out procedure was called, and the correct left knee and right foot identified.  The right foot second web space was then injected  with 40 mg of Depo-Medrol and 1 mL of 0.25% Marcaine under sterile conditions.  At this point, the left leg was prepped using sterile DuraPrep and draped using sterile technique.  Initially, through an anterolateral portal, the arthroscope with a pump attached was placed into an anteromedial portal and arthroscopic probe was placed.  The patient received Ancef 2 g IV preoperatively for prophylaxis. Arthroscopically, medial compartment was inspected.  He had 75% grade 3 and 20% grade 4 chondromalacia on the medial femoral condyle, which was debrided.  Medial tibial plateau showed 10% grade 4 chondromalacia and 30% grade 3, which was debrided.  Medial meniscus showed tearing of the posterior medial horn of which 40% was resected back to a stable rim. Intercondylar notch inspected.  Anterior and posterior cruciate ligaments were normal.  Lateral compartment inspected.  He had only grade 1 and 2 chondromalacia.  Lateral meniscus showed partial tearing 25% of the posterolateral corner, which was resected back to a stable rim.  Patellofemoral joint showed 30% grade 3 chondromalacia on the patellofemoral groove and this was debrided.  The patella tracked normally.  Medial and lateral gutters were free of pathology.  After this was done, it was felt that all pathology had been satisfactorily addressed.  The instruments  were removed.  Portals were closed with 3-0 nylon suture.  Sterile dressings were applied, and the patient was awakened and taken to the recovery room in stable condition.  FOLLOWUP CARE:  Mr. Pepitone be followed as an outpatient on Norco for pain.  He will be seen back in the office in a week for sutures out and followup.     Ester Hilley A. Thurston Hole, M.D.     RAW/MEDQ  D:  10/11/2011  T:  10/12/2011  Job:  [id number]

## 2011-10-18 ENCOUNTER — Encounter (HOSPITAL_BASED_OUTPATIENT_CLINIC_OR_DEPARTMENT_OTHER): Payer: Self-pay

## 2011-10-26 ENCOUNTER — Ambulatory Visit (INDEPENDENT_AMBULATORY_CARE_PROVIDER_SITE_OTHER): Payer: Medicare Other | Admitting: Family Medicine

## 2011-10-26 VITALS — BP 116/76 | HR 87 | Temp 98.3°F | Resp 16 | Ht 65.0 in | Wt 218.0 lb

## 2011-10-26 DIAGNOSIS — J40 Bronchitis, not specified as acute or chronic: Secondary | ICD-10-CM

## 2011-10-26 DIAGNOSIS — R5383 Other fatigue: Secondary | ICD-10-CM

## 2011-10-26 DIAGNOSIS — R1030 Lower abdominal pain, unspecified: Secondary | ICD-10-CM

## 2011-10-26 DIAGNOSIS — I2510 Atherosclerotic heart disease of native coronary artery without angina pectoris: Secondary | ICD-10-CM

## 2011-10-26 DIAGNOSIS — R059 Cough, unspecified: Secondary | ICD-10-CM

## 2011-10-26 DIAGNOSIS — R05 Cough: Secondary | ICD-10-CM

## 2011-10-26 MED ORDER — HYDROCODONE-HOMATROPINE 5-1.5 MG/5ML PO SYRP: 5.0000 mL | ORAL_SOLUTION | Freq: Three times a day (TID) | ORAL | Status: AC | PRN

## 2011-10-26 MED ORDER — CLOPIDOGREL BISULFATE 75 MG PO TABS: 75.0000 mg | ORAL_TABLET | Freq: Every day | ORAL | Status: DC

## 2011-10-26 MED ORDER — METOPROLOL TARTRATE 25 MG PO TABS: ORAL_TABLET | ORAL | Status: DC

## 2011-10-26 MED ORDER — LEVOFLOXACIN 500 MG PO TABS: 500.0000 mg | ORAL_TABLET | Freq: Every day | ORAL | Status: AC

## 2011-10-26 NOTE — Progress Notes (Signed)
66 yo man who is 15 days post left TKR, doing well from an orthopedic standpoint.  He has had a productive cough which started 8 days ago and is keeping him awake. Also patient  O:  NAD HEENT:  Negative Chest:  Exp wheezes  A: bronchitis, acute  P:  Levaquin, hydromet

## 2011-10-27 ENCOUNTER — Other Ambulatory Visit: Payer: Self-pay | Admitting: *Deleted

## 2011-10-27 MED ORDER — LISINOPRIL 5 MG PO TABS: 5.0000 mg | ORAL_TABLET | Freq: Every day | ORAL | Status: DC

## 2011-11-08 ENCOUNTER — Other Ambulatory Visit: Payer: Self-pay | Admitting: Family Medicine

## 2011-11-08 DIAGNOSIS — J40 Bronchitis, not specified as acute or chronic: Secondary | ICD-10-CM

## 2011-11-08 MED ORDER — LEVOFLOXACIN 500 MG PO TABS: 500.0000 mg | ORAL_TABLET | Freq: Every day | ORAL | Status: AC

## 2011-11-25 ENCOUNTER — Other Ambulatory Visit: Payer: Self-pay | Admitting: Family Medicine

## 2011-11-30 ENCOUNTER — Encounter: Payer: Self-pay | Admitting: Cardiovascular Disease

## 2011-11-30 ENCOUNTER — Ambulatory Visit (INDEPENDENT_AMBULATORY_CARE_PROVIDER_SITE_OTHER): Payer: Medicare Other | Admitting: Cardiovascular Disease

## 2011-11-30 VITALS — BP 162/83 | HR 64 | Ht 66.0 in | Wt 224.4 lb

## 2011-11-30 DIAGNOSIS — I2510 Atherosclerotic heart disease of native coronary artery without angina pectoris: Secondary | ICD-10-CM

## 2011-11-30 DIAGNOSIS — E785 Hyperlipidemia, unspecified: Secondary | ICD-10-CM

## 2011-11-30 LAB — HEPATIC FUNCTION PANEL
ALT: 21 U/L (ref 0–53)
AST: 22 U/L (ref 0–37)
Albumin: 4.3 g/dL (ref 3.5–5.2)
Alkaline Phosphatase: 51 U/L (ref 39–117)
Bilirubin, Direct: 0 mg/dL (ref 0.0–0.3)
Total Bilirubin: 0.6 mg/dL (ref 0.3–1.2)
Total Protein: 7.3 g/dL (ref 6.0–8.3)

## 2011-11-30 LAB — BASIC METABOLIC PANEL
BUN: 17 mg/dL (ref 6–23)
CO2: 24 mEq/L (ref 19–32)
Calcium: 9 mg/dL (ref 8.4–10.5)
Chloride: 108 mEq/L (ref 96–112)
Creatinine, Ser: 0.9 mg/dL (ref 0.4–1.5)
GFR: 94.09 mL/min (ref >60.00)
Glucose, Bld: 99 mg/dL (ref 70–99)
Potassium: 3.9 mEq/L (ref 3.5–5.1)
Sodium: 141 mEq/L (ref 135–145)

## 2011-11-30 LAB — LIPID PANEL
Cholesterol: 101 mg/dL (ref 0–200)
HDL: 40.3 mg/dL (ref >39.00)
LDL Cholesterol: 36 mg/dL (ref 0–99)
Total CHOL/HDL Ratio: 3
Triglycerides: 125 mg/dL (ref 0.0–149.0)
VLDL: 25 mg/dL (ref 0.0–40.0)

## 2011-11-30 MED ORDER — LISINOPRIL 10 MG PO TABS: 10.0000 mg | ORAL_TABLET | Freq: Every day | ORAL | Status: DC

## 2011-11-30 NOTE — Assessment & Plan Note (Signed)
I have personally reviewed his coronary angiograms from January, 2013. He had stenting of a culprit left circumflex stenosis. He does have a proximal/ostial LAD stenosis of approximately 60-70% but it does not appear to be flow obstructive at this point. He has diffuse disease elsewhere.  At present it sounds like he's doing fairly well. I would not refer him for bypass surgery at this point. [name] not having any significant angina he really doesn't have severe coronary artery disease that would warrant the surgical risk of coronary artery bypass grafting.  I would rather treat him very aggressively from a medical standpoint. [name] on Crestor 40 mg a day. His blood pressure is a little bit high. We'll increase his lisinopril to 10 mg a day. [name] already on a beta blocker.  I'll see him again in 3 months. If he continues to do well, then we will continue with her current medical therapy. If at any time he has severe chest pain I have asked him to come to the hospital. There certainly other things that we can do if he presents with a heart attack.  I think that there was simply miscommunication between Dr. Sanjuana Kava and  the patient.  I told the patient that his clinical picture was entirely different today ( as compared to January, 2013)  now that he has successfully made it through colon surgery and also has successfully made her knee surgery. We have a much clearer picture about his coronary artery disease and the fact that it really is only moderate in severity.

## 2011-11-30 NOTE — Progress Notes (Signed)
Shawn Meza Date of Birth  1946/02/01 [facility]     [place] Office  [address]    [address]   [address] [place], [place]  [address]    [place], [place]  [address] [phone]  Fax  [phone]  [phone]  Fax [phone]  Problem List: 1. CAD - s/p stenting of proximal LCx, diffuse CAD - including proximal LAD 2. Colon cancer - s/p partial bowel resection. ( notes not found in Epic) 3. Knee surgery  History of Present Illness:  Shawn Meza presents today to change cardiologist.  He had bare metal stent placement by Dr. Sanjuana Kava in January prior to colon cancer surgery.    Shawn Meza is upset by the fact that he was originally told that he would probably need bypass surgery but that he could not have that because of the colon cancer. He then had culprit lesion  stenting of the left circumflex artery. He made it through his colon surgery without complications. Since that time he's also made it through knee surgery without complications.  He is worried because he thinks that he still needs to have bypass surgery and that his doctors have not discussed this further with him.  He had some chest pain several months ago but did not come to the emergency room because he did not think that there was anything else that a cardiologist to do for him.  [name] not had any real episodes of angina. Most of his angina seems to be consistent with gastroesophageal reflux. [name] not exercising as much as he should    Current Outpatient Prescriptions on File Prior to Visit  Medication Sig Dispense Refill  . aspirin 81 MG tablet Take 81 mg by mouth daily.        . clopidogrel (PLAVIX) 75 MG tablet Take 1 tablet (75 mg total) by mouth daily.  90 tablet  3  . lisinopril (PRINIVIL,ZESTRIL) 5 MG tablet Take 1 tablet (5 mg total) by mouth daily.  30 tablet  6  . metoprolol tartrate (LOPRESSOR) 25 MG tablet 1/2 tablet once daily  30 tablet  6  . NEXIUM 40 MG capsule TAKE ONE (1)  CAPSULE EACH DAY  30 each  3  . nitroGLYCERIN (NITROSTAT) 0.4 MG SL tablet Place 0.4 mg under the tongue every 5 (five) minutes as needed. Chest pain      . rosuvastatin (CRESTOR) 40 MG tablet Take 40 mg by mouth daily.      Marland Kitchen VITAMIN D, CHOLECALCIFEROL, PO Take 1 tablet by mouth daily.         Allergies  Allergen Reactions  . Testosterone     REACTION: abd pain    Past Medical History  Diagnosis Date  . GERD (gastroesophageal reflux disease)   . Headache   . Hyperlipidemia   . Hypertension   . Nephrolithiasis     hx of  . Transient ischemic attack     hx of  . BPH (benign prostatic hypertrophy)   . CAD (coronary artery disease)   . Colon cancer   . Medial meniscus tear 10/11/2011    Past Surgical History  Procedure Date  . Transurethral resection of prostate   . Cardiac catheterization 5/12,1/13  . Knee arthroscopy 10/11/2011    Procedure: ARTHROSCOPY KNEE;  Surgeon: Nilda Simmer, MD;  Location:  SURGERY CENTER;  Service: Orthopedics;  Laterality: Left;  Left Knee Arthroscopy with Medial and Lateral Partial Menisectomy, Chondroplasty  . Steriod injection  10/11/2011    Procedure: STEROID INJECTION;  Surgeon: Nilda Simmer, MD;  Location: [facility];  Service: Orthopedics;  Laterality: Right;  Steroid Injection Second Toe    History  Smoking status  . Never Smoker   Smokeless tobacco  . Not on file    History  Alcohol Use  . Yes    Family History  Problem Relation Age of Onset  . Coronary artery disease Other     family hx of male 1st degree relative ,38  . Hyperlipidemia      family hx of  . Hypertension      family hx of  . Arthritis      family hx of    Reviw of Systems:  Reviewed in the HPI.  All other systems are negative.  Physical Exam: Blood pressure 162/83, pulse 64, height 5\' 6"  (1.676 m), weight 224 lb 6.4 oz (101.787 kg). General: Well developed, well nourished, in no acute distress.  Head: Normocephalic,  atraumatic, sclera non-icteric, mucus membranes are moist,   Neck: Supple. Carotids are 2 + without bruits. No JVD  Lungs: Clear bilaterally to auscultation.  Heart: regular rate.  normal  S1 S2. No murmurs, gallops or rubs.  Abdomen: Soft, non-tender, non-distended with normal bowel sounds. No hepatomegaly. No rebound/guarding. No masses.  Msk:  Strength and tone are normal  Extremities: No clubbing or cyanosis. No edema.  Distal pedal pulses are 2+ and equal bilaterally.  Neuro: Alert and oriented X 3. Moves all extremities spontaneously.  Psych:  Responds to questions appropriately with a normal affect.  ECG:   Assessment / Plan:

## 2011-11-30 NOTE — Patient Instructions (Signed)
Your physician recommends that you schedule a follow-up appointment in: 3 MONTHS   Your physician has recommended you make the following change in your medication:   INCREASE LISINOPRIL TO 10 MG DAILY  Your physician recommends that you return for a FASTING lipid profile: TODAY   DASH Diet The DASH diet stands for "Dietary Approaches to Stop Hypertension." It is a healthy eating plan that has been shown to reduce high blood pressure (hypertension) in as little as 14 days, while also possibly providing other significant health benefits. These other health benefits include reducing the risk of breast cancer after menopause and reducing the risk of type 2 diabetes, heart disease, colon cancer, and stroke. Health benefits also include weight loss and slowing kidney failure in patients with chronic kidney disease.  DIET GUIDELINES  Limit salt (sodium). Your diet should contain less than 1500 mg of sodium daily.   Limit refined or processed carbohydrates. Your diet should include mostly whole grains. Desserts and added sugars should be used sparingly.   Include small amounts of heart-healthy fats. These types of fats include nuts, oils, and tub margarine. Limit saturated and trans fats. These fats have been shown to be harmful in the body.  CHOOSING FOODS  The following food groups are based on a 2000 calorie diet. See your Registered Dietitian for individual calorie needs. Grains and Grain Products (6 to 8 servings daily)  Eat More Often: Whole-wheat bread, brown rice, whole-grain or wheat pasta, quinoa, popcorn without added fat or salt (air popped).   Eat Less Often: White bread, white pasta, white rice, cornbread.  Vegetables (4 to 5 servings daily)  Eat More Often: Fresh, frozen, and canned vegetables. Vegetables may be raw, steamed, roasted, or grilled with a minimal amount of fat.   Eat Less Often/Avoid: Creamed or fried vegetables. Vegetables in a cheese sauce.  Fruit (4 to 5 servings  daily)  Eat More Often: All fresh, canned (in natural juice), or frozen fruits. Dried fruits without added sugar. One hundred percent fruit juice ( cup [237 mL] daily).   Eat Less Often: Dried fruits with added sugar. Canned fruit in light or heavy syrup.  [employer], Fish, and Poultry (2 servings or less daily. One serving is 3 to 4 oz [85-114 g]).  Eat More Often: Ninety percent or leaner ground beef, tenderloin, sirloin. Round cuts of beef, chicken breast, [place] breast. All fish. Grill, bake, or broil your meat. Nothing should be fried.   Eat Less Often/Avoid: Fatty cuts of meat, [place], or chicken leg, thigh, or wing. Fried cuts of meat or fish.  Dairy (2 to 3 servings)  Eat More Often: Low-fat or fat-free milk, low-fat plain or light yogurt, reduced-fat or part-skim cheese.   Eat Less Often/Avoid: Milk (whole, 2%, skim, or chocolate).Whole milk yogurt. Full-fat cheeses.  Nuts, Seeds, and Legumes (4 to 5 servings per week)  Eat More Often: All without added salt.   Eat Less Often/Avoid: Salted nuts and seeds, canned beans with added salt.  Fats and Sweets (limited)  Eat More Often: Vegetable oils, tub margarines without trans fats, sugar-free gelatin. Mayonnaise and salad dressings.   Eat Less Often/Avoid: Coconut oils, palm oils, butter, stick margarine, cream, half and half, cookies, candy, pie.  FOR MORE INFORMATION The Dash Diet Eating Plan: www.dashdiet.org Document Released: 07/07/2011 Document Reviewed: 06/27/2011 [facility] Patient Information 2012 [place], [place].

## 2011-12-27 ENCOUNTER — Other Ambulatory Visit: Payer: Self-pay | Admitting: *Deleted

## 2011-12-27 MED ORDER — ROSUVASTATIN CALCIUM 40 MG PO TABS: 40.0000 mg | ORAL_TABLET | Freq: Every day | ORAL | Status: DC

## 2011-12-27 NOTE — Telephone Encounter (Signed)
Fax Received. Refill Completed. Shawn Meza (R.M.A)   

## 2011-12-28 ENCOUNTER — Other Ambulatory Visit: Payer: Self-pay | Admitting: Cardiovascular Disease

## 2011-12-28 ENCOUNTER — Other Ambulatory Visit: Payer: Self-pay | Admitting: Cardiology

## 2011-12-28 DIAGNOSIS — R1030 Lower abdominal pain, unspecified: Secondary | ICD-10-CM

## 2011-12-28 DIAGNOSIS — R5383 Other fatigue: Secondary | ICD-10-CM

## 2011-12-28 MED ORDER — METOPROLOL TARTRATE 25 MG PO TABS: ORAL_TABLET | ORAL | Status: DC

## 2012-01-01 ENCOUNTER — Other Ambulatory Visit: Payer: Self-pay | Admitting: *Deleted

## 2012-01-10 ENCOUNTER — Telehealth: Payer: Self-pay | Admitting: Cardiovascular Disease

## 2012-01-10 ENCOUNTER — Other Ambulatory Visit: Payer: Self-pay | Admitting: *Deleted

## 2012-01-10 DIAGNOSIS — R1030 Lower abdominal pain, unspecified: Secondary | ICD-10-CM

## 2012-01-10 DIAGNOSIS — R5383 Other fatigue: Secondary | ICD-10-CM

## 2012-01-10 MED ORDER — METOPROLOL TARTRATE 25 MG PO TABS: ORAL_TABLET | ORAL | Status: DC

## 2012-01-10 NOTE — Progress Notes (Signed)
Spoke with wife, reviewed med list, pt was confused with meds, was taking hydrocodone / not noted on list but she stated he is supposed to be off this med, pt was putting the med  in  Pill in daily container to take. Pt not been feeling well and was reviewing meds, pt was taking metoprolol 25 mg once daily and had continued despite prior ov review and it was changed per Tereso Newcomer PA. Told him to take metoprolol 25 mg tablet 1/2 tablet twice daily 12 hrs apart, wife verbalized understanding and told her to get bp daily and call with any further questions, agreed to plan.

## 2012-01-10 NOTE — Telephone Encounter (Signed)
Done/ meds clarified

## 2012-01-10 NOTE — Telephone Encounter (Signed)
New problem:  Clarification on which medication he should take.

## 2012-02-08 ENCOUNTER — Other Ambulatory Visit: Payer: Self-pay | Admitting: Cardiovascular Disease

## 2012-02-08 MED ORDER — CLOPIDOGREL BISULFATE 75 MG PO TABS: 75.0000 mg | ORAL_TABLET | Freq: Every day | ORAL | Status: DC

## 2012-02-08 NOTE — Telephone Encounter (Signed)
Refilled generic plavix 

## 2012-03-01 ENCOUNTER — Other Ambulatory Visit (INDEPENDENT_AMBULATORY_CARE_PROVIDER_SITE_OTHER): Payer: Medicare Other

## 2012-03-01 ENCOUNTER — Ambulatory Visit (INDEPENDENT_AMBULATORY_CARE_PROVIDER_SITE_OTHER): Payer: Medicare Other | Admitting: Cardiovascular Disease

## 2012-03-01 ENCOUNTER — Encounter: Payer: Self-pay | Admitting: Cardiovascular Disease

## 2012-03-01 VITALS — BP 97/68 | HR 69 | Ht 66.0 in | Wt 220.8 lb

## 2012-03-01 DIAGNOSIS — R0989 Other specified symptoms and signs involving the circulatory and respiratory systems: Secondary | ICD-10-CM

## 2012-03-01 DIAGNOSIS — I2510 Atherosclerotic heart disease of native coronary artery without angina pectoris: Secondary | ICD-10-CM

## 2012-03-01 DIAGNOSIS — I10 Essential (primary) hypertension: Secondary | ICD-10-CM

## 2012-03-01 LAB — BASIC METABOLIC PANEL
BUN: 24 mg/dL — ABNORMAL HIGH (ref 6–23)
CO2: 24 mEq/L (ref 19–32)
Calcium: 9.7 mg/dL (ref 8.4–10.5)
Chloride: 105 mEq/L (ref 96–112)
Creatinine, Ser: 1 mg/dL (ref 0.4–1.5)
GFR: 79.92 mL/min (ref >60.00)
Glucose, Bld: 83 mg/dL (ref 70–99)
Potassium: 4.1 mEq/L (ref 3.5–5.1)
Sodium: 139 mEq/L (ref 135–145)

## 2012-03-01 NOTE — Assessment & Plan Note (Signed)
His BP is much better .  He will continue current meds and continue to watch his diet. Will check BMP today.  Will check fasting labs in 6 months.

## 2012-03-01 NOTE — Assessment & Plan Note (Signed)
Shawn Meza is doing well from a cardiac standpoint.  [name] not having any angina.  His stamina is reduced since he had arthroscopic surgery on his knee and is still not back doing all of his normal exercises.

## 2012-03-01 NOTE — Progress Notes (Signed)
Shawn Meza Date of Birth  07-17-1946 [facility]     [place] Office  [address]    [address]   [address] [place], [place]  [address]    [place], [place]  [address] [phone]  Fax  [phone]  [phone]  Fax [phone]  Problem List: 1. CAD - s/p stenting of proximal LCx, diffuse CAD - including proximal LAD 2. Colon cancer - s/p partial bowel resection. ( notes not found in Epic) 3. Knee surgery  History of Present Illness:  Shawn Meza  Is a 66 yo with hx of CAD - s/p stenting of hix LCX.  He has moderate disease in his proxomal LAD.   [name] not had any real episodes of angina. Most of his angina seems to be consistent with gastroesophageal reflux. [name] not exercising as much as he should.  He had knee surgery January 2013.    Current Outpatient Prescriptions on File Prior to Visit  Medication Sig Dispense Refill  . aspirin 81 MG tablet Take 81 mg by mouth daily.        . clopidogrel (PLAVIX) 75 MG tablet Take 1 tablet (75 mg total) by mouth daily.  90 tablet  3  . lisinopril (PRINIVIL,ZESTRIL) 10 MG tablet Take 1 tablet (10 mg total) by mouth daily.  30 tablet  6  . metoprolol tartrate (LOPRESSOR) 25 MG tablet One half tablet twice daily.  30 tablet  11  . NEXIUM 40 MG capsule TAKE ONE (1) CAPSULE EACH DAY  30 each  3  . nitroGLYCERIN (NITROSTAT) 0.4 MG SL tablet Place 0.4 mg under the tongue every 5 (five) minutes as needed. Chest pain      . rosuvastatin (CRESTOR) 40 MG tablet Take 1 tablet (40 mg total) by mouth daily.  30 tablet  5  . VITAMIN D, CHOLECALCIFEROL, PO Take 1 tablet by mouth daily.         Allergies  Allergen Reactions  . Testosterone     REACTION: abd pain    Past Medical History  Diagnosis Date  . GERD (gastroesophageal reflux disease)   . Headache   . Hyperlipidemia   . Hypertension   . Nephrolithiasis     hx of  . Transient ischemic attack     hx of  . BPH (benign prostatic hypertrophy)   . CAD (coronary  artery disease)   . Colon cancer   . Medial meniscus tear 10/11/2011    Past Surgical History  Procedure Date  . Transurethral resection of prostate   . Cardiac catheterization 5/12,1/13  . Knee arthroscopy 10/11/2011    Procedure: ARTHROSCOPY KNEE;  Surgeon: Nilda Simmer, MD;  Location: [facility];  Service: Orthopedics;  Laterality: Left;  Left Knee Arthroscopy with Medial and Lateral Partial Menisectomy, Chondroplasty  . Steriod injection 10/11/2011    Procedure: STEROID INJECTION;  Surgeon: Nilda Simmer, MD;  Location: [facility];  Service: Orthopedics;  Laterality: Right;  Steroid Injection Second Toe    History  Smoking status  . Never Smoker   Smokeless tobacco  . Not on file    History  Alcohol Use  . Yes    Family History  Problem Relation Age of Onset  . Coronary artery disease Other     family hx of male 1st degree relative ,64  . Hyperlipidemia      family hx of  . Hypertension      family hx of  .  Arthritis      family hx of    Reviw of Systems:  Reviewed in the HPI.  All other systems are negative.  Physical Exam: Blood pressure 97/68, pulse 69, height 5\' 6"  (1.676 m), weight 220 lb 12.8 oz (100.154 kg). General: Well developed, well nourished, in no acute distress.  Head: Normocephalic, atraumatic, sclera non-icteric, mucus membranes are moist,   Neck: Supple. Carotids are 2 + without bruits. No JVD  Lungs: Clear bilaterally to auscultation.  Heart: regular rate.  normal  S1 S2. No murmurs, gallops or rubs.  Abdomen: Soft, non-tender, non-distended with normal bowel sounds. No hepatomegaly. No rebound/guarding. No masses.  Msk:  Strength and tone are normal  Extremities: No clubbing or cyanosis. No edema.  Distal pedal pulses are 2+ and equal bilaterally.  Neuro: Alert and oriented X 3. Moves all extremities spontaneously.  Psych:  Responds to questions appropriately with a normal  affect.  ECG:   Assessment / Plan:

## 2012-03-01 NOTE — Patient Instructions (Addendum)
Your physician recommends that you return for lab work in: today/ bmet to check your kidneys and potassium level  Your physician wants you to follow-up in: 6 months  You will receive a reminder letter in the mail two months in advance. If you don't receive a letter, please call our office to schedule the follow-up appointment.  Your physician recommends that you return for a FASTING lipid profile: 6 months

## 2012-04-04 ENCOUNTER — Encounter: Payer: Self-pay | Admitting: Family Medicine

## 2012-04-04 ENCOUNTER — Ambulatory Visit (INDEPENDENT_AMBULATORY_CARE_PROVIDER_SITE_OTHER): Payer: Medicare Other | Admitting: Family Medicine

## 2012-04-04 VITALS — BP 137/74 | HR 71 | Temp 96.9°F | Resp 17 | Ht 65.5 in | Wt 220.2 lb

## 2012-04-04 DIAGNOSIS — F32A Depression, unspecified: Secondary | ICD-10-CM

## 2012-04-04 DIAGNOSIS — Z0000 Encounter for general adult medical examination without abnormal findings: Secondary | ICD-10-CM

## 2012-04-04 DIAGNOSIS — K219 Gastro-esophageal reflux disease without esophagitis: Secondary | ICD-10-CM

## 2012-04-04 DIAGNOSIS — M25562 Pain in left knee: Secondary | ICD-10-CM

## 2012-04-04 DIAGNOSIS — R413 Other amnesia: Secondary | ICD-10-CM

## 2012-04-04 DIAGNOSIS — I2510 Atherosclerotic heart disease of native coronary artery without angina pectoris: Secondary | ICD-10-CM

## 2012-04-04 DIAGNOSIS — E559 Vitamin D deficiency, unspecified: Secondary | ICD-10-CM

## 2012-04-04 DIAGNOSIS — E785 Hyperlipidemia, unspecified: Secondary | ICD-10-CM

## 2012-04-04 DIAGNOSIS — F411 Generalized anxiety disorder: Secondary | ICD-10-CM

## 2012-04-04 DIAGNOSIS — F329 Major depressive disorder, single episode, unspecified: Secondary | ICD-10-CM

## 2012-04-04 DIAGNOSIS — M25569 Pain in unspecified knee: Secondary | ICD-10-CM

## 2012-04-04 LAB — LIPID PANEL
Cholesterol: 190 mg/dL (ref 0–200)
HDL: 38 mg/dL — ABNORMAL LOW (ref >39)
LDL Cholesterol: 112 mg/dL — ABNORMAL HIGH (ref 0–99)
Total CHOL/HDL Ratio: 5 Ratio
Triglycerides: 202 mg/dL — ABNORMAL HIGH (ref <150)
VLDL: 40 mg/dL (ref 0–40)

## 2012-04-04 LAB — CBC WITH DIFFERENTIAL/PLATELET
Basophils Absolute: 0 10*3/uL (ref 0.0–0.1)
Basophils Relative: 0 % (ref 0–1)
Eosinophils Absolute: 0.3 10*3/uL (ref 0.0–0.7)
Eosinophils Relative: 3 % (ref 0–5)
HCT: 43 % (ref 39.0–52.0)
Hemoglobin: 15.5 g/dL (ref 13.0–17.0)
Lymphocytes Relative: 34 % (ref 12–46)
Lymphs Abs: 3 10*3/uL (ref 0.7–4.0)
MCH: 31.6 pg (ref 26.0–34.0)
MCHC: 36 g/dL (ref 30.0–36.0)
MCV: 87.8 fL (ref 78.0–100.0)
Monocytes Absolute: 0.8 10*3/uL (ref 0.1–1.0)
Monocytes Relative: 9 % (ref 3–12)
Neutro Abs: 4.7 10*3/uL (ref 1.7–7.7)
Neutrophils Relative %: 54 % (ref 43–77)
Platelets: 206 10*3/uL (ref 150–400)
RBC: 4.9 MIL/uL (ref 4.22–5.81)
RDW: 14 % (ref 11.5–15.5)
WBC: 8.8 10*3/uL (ref 4.0–10.5)

## 2012-04-04 LAB — TSH: TSH: 1.577 u[IU]/mL (ref 0.350–4.500)

## 2012-04-04 LAB — COMPREHENSIVE METABOLIC PANEL
ALT: 24 U/L (ref 0–53)
AST: 22 U/L (ref 0–37)
Albumin: 4.6 g/dL (ref 3.5–5.2)
Alkaline Phosphatase: 62 U/L (ref 39–117)
BUN: 16 mg/dL (ref 6–23)
CO2: 22 mEq/L (ref 19–32)
Calcium: 9.6 mg/dL (ref 8.4–10.5)
Chloride: 104 mEq/L (ref 96–112)
Creat: 0.99 mg/dL (ref 0.50–1.35)
Glucose, Bld: 92 mg/dL (ref 70–99)
Potassium: 4.3 mEq/L (ref 3.5–5.3)
Sodium: 139 mEq/L (ref 135–145)
Total Bilirubin: 0.5 mg/dL (ref 0.3–1.2)
Total Protein: 7.2 g/dL (ref 6.0–8.3)

## 2012-04-04 LAB — VITAMIN B12: Vitamin B-12: 460 pg/mL (ref 211–911)

## 2012-04-04 LAB — IFOBT (OCCULT BLOOD): IFOBT: NEGATIVE

## 2012-04-04 MED ORDER — MELOXICAM 7.5 MG PO TABS: ORAL_TABLET | ORAL | Status: DC

## 2012-04-04 MED ORDER — ALPRAZOLAM 0.25 MG PO TABS: 0.2500 mg | ORAL_TABLET | Freq: Two times a day (BID) | ORAL | Status: DC | PRN

## 2012-04-04 MED ORDER — ESOMEPRAZOLE MAGNESIUM 40 MG PO CPDR: 40.0000 mg | DELAYED_RELEASE_CAPSULE | Freq: Every day | ORAL | Status: DC

## 2012-04-04 MED ORDER — NITROGLYCERIN 0.4 MG SL SUBL: 0.4000 mg | SUBLINGUAL_TABLET | SUBLINGUAL | Status: DC | PRN

## 2012-04-04 MED ORDER — ROSUVASTATIN CALCIUM 40 MG PO TABS: 40.0000 mg | ORAL_TABLET | Freq: Every day | ORAL | Status: DC

## 2012-04-04 MED ORDER — FLUOXETINE HCL 20 MG PO TABS: 20.0000 mg | ORAL_TABLET | Freq: Every day | ORAL | Status: DC

## 2012-04-04 NOTE — Progress Notes (Signed)
@[email or web address] @  Patient ID: Shawn Meza MRN: [id number], DOB: Jan 30, 1946 66 y.o. Date of Encounter: 04/04/2012, 12:33 PM  Primary Physician: Elvina Sidle, MD  Chief Complaint: Physical (CPE)  HPI: 66 y.o. y/o male with history noted below here for CPE.  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  Food stamps would yield only $27 per month. He says his life really became hard when separating from first wife 35 years ago, and he doesn't see much of his daughter now (she's married, anniversary Sept 8) who works in Physiological scientist business in [employer] He almost committed suicide with that separation.  He complains of memory loss as well, although he recently finished a home improvement project and he was able to calculate all the details himself.  His depression really worsened after his cardiac stent last January.  He complains of knee pain on the left, with intermittent severe pains and incomplete extension.  He underwent arthroscopic surgery on that left knee last February, but it continues to bother.  He feels like he just can't do anything.  At night, he finds himself weeping in bed.  Review of Systems: Consitutional: No fever, chills, night sweats, lymphadenopathy, or weight changes.  Fatigue is major problem Eyes: No visual changes, eye redness, or discharge. ENT/Mouth: Ears: No otalgia, tinnitus, hearing loss, discharge. Nose: No congestion, rhinorrhea, sinus pain, or epistaxis. Throat: No sore throat, post nasal drip, or teeth pain. Cardiovascular: No CP, palpitations, diaphoresis, DOE, edema, orthopnea, PND. Respiratory: No cough, hemoptysis, SOB, or wheezing. Gastrointestinal: No anorexia, dysphagia, reflux, pain, nausea, vomiting, hematemesis, diarrhea, constipation, BRBPR, or melena.  Often feels that he incompletely empties with BM Genitourinary: No dysuria, urgency,  hematuria, incontinence, nocturia, decreased urinary stream, discharge, impotence, or testicular pain/masses.  Frequency is present Musculoskeletal: No myalgia or weakness.  Complains of joint aches and left knee Skin: No rash, erythema, lesion changes, pain, warmth, jaundice, or pruritis. Neurological: No headache, dizziness, syncope, seizures, tremors, memory loss, coordination problems, or paresthesias. Psychological: No hallucinations, SI/HI.  Depressed with occasional anxiety attacks Endocrine: No fatigue, polydipsia, polyphagia, or known diabetes. All other systems were reviewed and are otherwise negative.  Past Medical History  Diagnosis Date  . GERD (gastroesophageal reflux disease)   . Headache   . Hyperlipidemia   . Hypertension   . Nephrolithiasis     hx of  . Transient ischemic attack     hx of  . BPH (benign prostatic hypertrophy)   . CAD (coronary artery disease)   . Colon cancer   . Medial meniscus tear 10/11/2011     Past Surgical History  Procedure Date  . Transurethral resection of prostate   . Cardiac catheterization 5/12,1/13  . Knee arthroscopy 10/11/2011    Procedure: ARTHROSCOPY KNEE;  Surgeon: Nilda Simmer, MD;  Location: [facility];  Service: Orthopedics;  Laterality: Left;  Left Knee Arthroscopy with Medial and Lateral Partial Menisectomy, Chondroplasty  . Steriod injection 10/11/2011    Procedure: STEROID INJECTION;  Surgeon: Nilda Simmer, MD;  Location: [facility];  Service: Orthopedics;  Laterality: Right;  Steroid Injection Second Toe    Home Meds:  Prior to Admission medications   Medication Sig Start Date End Date Taking? Authorizing Provider  aspirin 81 MG tablet Take 81 mg by mouth daily.     Yes Historical Provider, MD  clopidogrel (PLAVIX)  75 MG tablet Take 1 tablet (75 mg total) by mouth daily. 02/08/12  Yes Vesta Mixer, MD  esomeprazole (NEXIUM) 40 MG capsule Take 1 capsule (40 mg total) by mouth daily  before breakfast. 04/04/12  Yes Elvina Sidle, MD  lisinopril (PRINIVIL,ZESTRIL) 10 MG tablet Take 1 tablet (10 mg total) by mouth daily. 11/30/11  Yes Vesta Mixer, MD  metoprolol tartrate (LOPRESSOR) 25 MG tablet One half tablet twice daily. 01/10/12  Yes Vesta Mixer, MD  nitroGLYCERIN (NITROSTAT) 0.4 MG SL tablet Place 1 tablet (0.4 mg total) under the tongue every 5 (five) minutes as needed. Chest pain 04/04/12  Yes Elvina Sidle, MD  rosuvastatin (CRESTOR) 40 MG tablet Take 1 tablet (40 mg total) by mouth daily. 04/04/12  Yes Elvina Sidle, MD  VITAMIN D, CHOLECALCIFEROL, PO Take 1 tablet by mouth daily.    Yes Historical Provider, MD  ALPRAZolam (XANAX) 0.25 MG tablet Take 1 tablet (0.25 mg total) by mouth 2 (two) times daily as needed for sleep. 04/04/12 05/04/12  Elvina Sidle, MD  FLUoxetine (PROZAC) 20 MG tablet Take 1 tablet (20 mg total) by mouth daily. 04/04/12 07/03/12  Elvina Sidle, MD  meloxicam (MOBIC) 7.5 MG tablet One daily for arthritis 04/04/12   Elvina Sidle, MD    Allergies:  Allergies  Allergen Reactions  . Testosterone     REACTION: abd pain    History   Social History  . Marital Status: Married    Spouse Name: N/A    Number of Children: N/A  . Years of Education: N/A   Occupational History  . retired    Social History Main Topics  . Smoking status: Never Smoker   . Smokeless tobacco: Not on file  . Alcohol Use: No  . Drug Use: No  . Sexually Active: Not on file   Other Topics Concern  . Not on file   Social History Narrative  . No narrative on file    Family History  Problem Relation Age of Onset  . Coronary artery disease Other     family hx of male 1st degree relative ,24  . Hyperlipidemia      family hx of  . Hypertension      family hx of  . Arthritis      family hx of    Physical Exam: Blood pressure 137/74, pulse 71, temperature 96.9 F (36.1 C), temperature source Oral, resp. rate 17, height 5' 5.5" (1.664 m), weight 220 lb  3.2 oz (99.882 kg).  General: Well developed, well nourished, in no acute distress. HEENT: Normocephalic, atraumatic. Conjunctiva pink, sclera non-icteric. Pupils 2 mm constricting to 1 mm, round, regular, and equally reactive to light and accomodation. EOMI. Internal auditory canal clear. TMs with good cone of light and without pathology. Nasal mucosa pink. Nares are without discharge. No sinus tenderness. Oral mucosa pink. Dentition poor condition teeth 17 through 22. Pharynx without exudate.   Neck: Supple. Trachea midline. No thyromegaly. Full ROM. No lymphadenopathy. Lungs: Clear to auscultation bilaterally without wheezes, rales, or rhonchi. Breathing is of normal effort and unlabored. Cardiovascular: RRR with S1 S2. No murmurs, rubs, or gallops appreciated. Distal pulses 2+ symmetrically. No carotid or abdominal bruits Abdomen: Soft, non-tender, non-distended with normoactive bowel sounds. No hepatosplenomegaly or masses. No rebound/guarding. No CVA tenderness. Without hernias.  Rectal: No external hemorrhoids or fissures. Rectal vault without masses.  Genitourinary:  circumcised male. No penile lesions. Testes descended bilaterally, and smooth without tenderness or masses.  Musculoskeletal: Full  range of motion and 5/5 strength throughout. Without swelling, atrophy, tenderness, crepitus, or warmth. Extremities without clubbing, cyanosis, or edema. Calves supple. Skin: Warm and moist without erythema, ecchymosis, wounds, or rash. Neuro: A+Ox3. CN II-XII grossly intact. Moves all extremities spontaneously. Full sensation throughout. Normal gait. DTR 2+ throughout upper and lower extremities. Finger to nose intact. Psych:  Responds to questions appropriately with a normal affect.   Studies: CBC, CMET, Lipid, TSH, Vitamin D, Vitamin B12 all pending.  U/A  Assessment/Plan:  66 y.o. y/o adult male here for CPE with multiple complaints referable to depression. 1. Healthcare maintenance  POCT CBC,  Lipid panel, POCT urinalysis dipstick, Comprehensive metabolic panel, TSH, Vitamin D, 25-hydroxy, Vitamin B12, IFOBT POC (occult bld, rslt in office)  2. Vitamin d deficiency  Vitamin D, 25-hydroxy  3. Memory loss  TSH, Vitamin B12  4. Hyperlipidemia  Lipid panel, rosuvastatin (CRESTOR) 40 MG tablet  5. Knee pain, left  MR Knee Left  Wo Contrast, meloxicam (MOBIC) 7.5 MG tablet  6. Depression  FLUoxetine (PROZAC) 20 MG tablet, ALPRAZolam (XANAX) 0.25 MG tablet  7. GERD (gastroesophageal reflux disease)  esomeprazole (NEXIUM) 40 MG capsule  8. CAD (coronary artery disease)  nitroGLYCERIN (NITROSTAT) 0.4 MG SL tablet   Follow up 2 weeks. Patient willing to consider psych consultation. -  Signed, Elvina Sidle, MD 04/04/2012 12:33 PM

## 2012-04-05 ENCOUNTER — Telehealth: Payer: Self-pay | Admitting: Radiology

## 2012-04-05 LAB — VITAMIN D 25 HYDROXY (VIT D DEFICIENCY, FRACTURES): Vit D, 25-Hydroxy: 32 ng/mL (ref 30–89)

## 2012-04-05 NOTE — Telephone Encounter (Signed)
Patients pharmacy called, patient wants 90 day supply of his medications at the pharmacy, they state someone from here has called to get them changed to 90 day supply, but there is no record of any call from here, I am unsure who would have called pharmacy. Patient did get 5 prescriptions yesterday. Please advise if they can be changed to 90 day supply. I am confused because pharmacy states they were given with 5 renewals.

## 2012-04-05 NOTE — Telephone Encounter (Signed)
You can change the prescriptions to 90 day supply with 1 RF. (instead of the 30 day supply with 5 refills)

## 2012-04-06 ENCOUNTER — Other Ambulatory Visit: Payer: Self-pay | Admitting: Family Medicine

## 2012-04-06 DIAGNOSIS — K219 Gastro-esophageal reflux disease without esophagitis: Secondary | ICD-10-CM

## 2012-04-06 DIAGNOSIS — I2510 Atherosclerotic heart disease of native coronary artery without angina pectoris: Secondary | ICD-10-CM

## 2012-04-06 DIAGNOSIS — F32A Depression, unspecified: Secondary | ICD-10-CM

## 2012-04-06 DIAGNOSIS — F329 Major depressive disorder, single episode, unspecified: Secondary | ICD-10-CM

## 2012-04-06 DIAGNOSIS — M25562 Pain in left knee: Secondary | ICD-10-CM

## 2012-04-06 DIAGNOSIS — E785 Hyperlipidemia, unspecified: Secondary | ICD-10-CM

## 2012-04-06 MED ORDER — FLUOXETINE HCL 20 MG PO TABS: 20.0000 mg | ORAL_TABLET | Freq: Every day | ORAL | Status: DC

## 2012-04-06 MED ORDER — MELOXICAM 7.5 MG PO TABS: ORAL_TABLET | ORAL | Status: DC

## 2012-04-06 MED ORDER — NITROGLYCERIN 0.4 MG SL SUBL: 0.4000 mg | SUBLINGUAL_TABLET | SUBLINGUAL | Status: DC | PRN

## 2012-04-06 MED ORDER — ROSUVASTATIN CALCIUM 40 MG PO TABS: 40.0000 mg | ORAL_TABLET | Freq: Every day | ORAL | Status: DC

## 2012-04-06 MED ORDER — ESOMEPRAZOLE MAGNESIUM 40 MG PO CPDR: 40.0000 mg | DELAYED_RELEASE_CAPSULE | Freq: Every day | ORAL | Status: DC

## 2012-04-10 ENCOUNTER — Ambulatory Visit
Admission: RE | Admit: 2012-04-10 | Discharge: 2012-04-10 | Disposition: A | Discharge status: Home / Self Care | Payer: Medicare Other | Source: Ambulatory Visit | Attending: Family Medicine | Admitting: Family Medicine

## 2012-04-10 DIAGNOSIS — M25562 Pain in left knee: Secondary | ICD-10-CM

## 2012-04-17 NOTE — Telephone Encounter (Signed)
Done

## 2012-04-18 ENCOUNTER — Ambulatory Visit (INDEPENDENT_AMBULATORY_CARE_PROVIDER_SITE_OTHER): Payer: Medicare Other | Admitting: Family Medicine

## 2012-04-18 VITALS — BP 136/74 | HR 58 | Temp 98.0°F | Resp 16 | Ht 65.5 in | Wt 215.4 lb

## 2012-04-18 DIAGNOSIS — R5383 Other fatigue: Secondary | ICD-10-CM

## 2012-04-18 DIAGNOSIS — M25562 Pain in left knee: Secondary | ICD-10-CM

## 2012-04-18 DIAGNOSIS — E785 Hyperlipidemia, unspecified: Secondary | ICD-10-CM

## 2012-04-18 DIAGNOSIS — L299 Pruritus, unspecified: Secondary | ICD-10-CM

## 2012-04-18 DIAGNOSIS — F32A Depression, unspecified: Secondary | ICD-10-CM

## 2012-04-18 DIAGNOSIS — F329 Major depressive disorder, single episode, unspecified: Secondary | ICD-10-CM

## 2012-04-18 DIAGNOSIS — R5381 Other malaise: Secondary | ICD-10-CM

## 2012-04-18 DIAGNOSIS — Z23 Encounter for immunization: Secondary | ICD-10-CM

## 2012-04-18 DIAGNOSIS — M25569 Pain in unspecified knee: Secondary | ICD-10-CM

## 2012-04-18 DIAGNOSIS — K219 Gastro-esophageal reflux disease without esophagitis: Secondary | ICD-10-CM

## 2012-04-18 DIAGNOSIS — R109 Unspecified abdominal pain: Secondary | ICD-10-CM

## 2012-04-18 DIAGNOSIS — I2510 Atherosclerotic heart disease of native coronary artery without angina pectoris: Secondary | ICD-10-CM

## 2012-04-18 DIAGNOSIS — R1030 Lower abdominal pain, unspecified: Secondary | ICD-10-CM

## 2012-04-18 DIAGNOSIS — Z0000 Encounter for general adult medical examination without abnormal findings: Secondary | ICD-10-CM

## 2012-04-18 DIAGNOSIS — F3289 Other specified depressive episodes: Secondary | ICD-10-CM

## 2012-04-18 MED ORDER — CLOPIDOGREL BISULFATE 75 MG PO TABS: 75.0000 mg | ORAL_TABLET | Freq: Every day | ORAL | Status: DC

## 2012-04-18 MED ORDER — ROSUVASTATIN CALCIUM 40 MG PO TABS: 40.0000 mg | ORAL_TABLET | Freq: Every day | ORAL | Status: DC

## 2012-04-18 MED ORDER — METOPROLOL TARTRATE 25 MG PO TABS: ORAL_TABLET | ORAL | Status: DC

## 2012-04-18 MED ORDER — MELOXICAM 7.5 MG PO TABS: ORAL_TABLET | ORAL | Status: DC

## 2012-04-18 MED ORDER — ALPRAZOLAM 0.25 MG PO TABS: 0.2500 mg | ORAL_TABLET | Freq: Two times a day (BID) | ORAL | Status: AC | PRN

## 2012-04-18 MED ORDER — SUCRALFATE 1 G PO TABS: 1.0000 g | ORAL_TABLET | Freq: Two times a day (BID) | ORAL | Status: DC

## 2012-04-18 MED ORDER — BETAMETHASONE DIPROPIONATE 0.05 % EX CREA: TOPICAL_CREAM | Freq: Two times a day (BID) | CUTANEOUS | Status: DC

## 2012-04-18 MED ORDER — INFLUENZA VIRUS VACC SPLIT PF IM SUSP: 0.5000 mL | INTRAMUSCULAR | Status: DC

## 2012-04-18 NOTE — Progress Notes (Signed)
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 Food stamps would yield only $27 per month.  He says his life really became hard when separating from first wife 35 years ago, and he doesn't see much of his daughter now (she's married, anniversary Sept 8) who works in Physiological scientist business in [employer]  He almost committed suicide with that separation.  He complains of memory loss as well, although he recently finished a home improvement project and he was able to calculate all the details himself.  His depression really worsened after his cardiac stent last January. He complains of knee pain on the left, with intermittent severe pains and incomplete extension. He underwent arthroscopic surgery on that left knee last February, but it continues to bother. He feels like he just can't do anything. At night, he finds himself weeping in bed  Today, Mr. Langhorst reports that he feels much better.  He is not as excitable, and overall feels much better.  (Watching a fight the other night, he did not get upset) He has had some upset stomach lately, as has his wife.  He goes to the bathroom several times a day instead of the usual once daily. He continues the Nexium and a fiber supplement.  No blood in stool He has some ongoing problems with his forearms (tingling) for which he uses betamethasone cream prn with good effect Finally, he wants a flu shot and is considering the Zostavax  Objective:  Appears happy and relaxed with good eye contact, NAD Skin:  Good color Chest:  Clear Heart: regular without murmur Abdomen:  Somewhat hyperactive BS with no masses or HSM, nontender  Assessment: 1. GERD (gastroesophageal reflux disease)  sucralfate (CARAFATE) 1 G tablet  2. Pruritus - disorder  betamethasone dipropionate (DIPROLENE) 0.05 % cream  3. Hyperlipidemia  rosuvastatin (CRESTOR) 40 MG tablet  4.  Groin pain  metoprolol tartrate (LOPRESSOR) 25 MG tablet  5. Fatigue  metoprolol tartrate (LOPRESSOR) 25 MG tablet  6. Knee pain, left  meloxicam (MOBIC) 7.5 MG tablet  7. Depression  ALPRAZolam (XANAX) 0.25 MG tablet  8. CAD (coronary artery disease)  rosuvastatin (CRESTOR) 40 MG tablet, clopidogrel (PLAVIX) 75 MG tablet  9. Healthcare maintenance  influenza  inactive virus vaccine (FLUZONE/FLUARIX) injection 0.5 mL   Definitely less depressed! We will try two weeks of bid sucralfate for the gastroenteritis/GERD and recheck in two weeks

## 2012-04-18 NOTE — Patient Instructions (Addendum)
Herpes Zoster Virus Vaccine What is this medicine? HERPES ZOSTER VIRUS VACCINE (HUR peez ZOS ter vahy ruhs vak SEEN) is a vaccine. It is used to prevent shingles in adults 68 years old and over. This vaccine is not used to treat shingles or nerve pain from shingles. This medicine may be used for other purposes; ask your health care provider or pharmacist if you have questions. What should I tell my health care provider before I take this medicine? They need to know if you have any of these conditions: -cancer like leukemia or lymphoma -immune system problems or therapy -infection with fever -tuberculosis -an unusual or allergic reaction to vaccines, neomycin, gelatin, other medicines, foods, dyes, or preservatives -pregnant or trying to get pregnant -breast-feeding How should I use this medicine? This vaccine is for injection under the skin. It is given by a health care professional. Talk to your pediatrician regarding the use of this medicine in children. This medicine is not approved for use in children. Overdosage: If you think you have taken too much of this medicine contact a poison control center or emergency room at once. NOTE: This medicine is only for you. Do not share this medicine with others. What if I miss a dose? This does not apply. What may interact with this medicine? Do not take this medicine with any of the following medications: -adalimumab -anakinra -etanercept -infliximab -medicines to treat cancer -medicines that suppress your immune system This medicine may also interact with the following medications: -immunoglobulins -steroid medicines like prednisone or cortisone This list may not describe all possible interactions. Give your health care provider a list of all the medicines, herbs, non-prescription drugs, or dietary supplements you use. Also tell them if you smoke, drink alcohol, or use illegal drugs. Some items may interact with your medicine. What should I  watch for while using this medicine? Visit your doctor for regular check ups. This vaccine, like all vaccines, may not fully protect everyone. After receiving this vaccine it may be possible to pass chickenpox infection to others. Avoid people with immune system problems, pregnant women who have not had chickenpox, and newborns of women who have not had chickenpox. Talk to your doctor for more information. What side effects may I notice from receiving this medicine? Side effects that you should report to your doctor or health care professional as soon as possible: -allergic reactions like skin rash, itching or hives, swelling of the face, lips, or tongue -breathing problems -feeling faint or lightheaded, falls -fever, flu-like symptoms -pain, tingling, numbness in the hands or feet -swelling of the ankles, feet, hands -unusually weak or tired Side effects that usually do not require medical attention (report to your doctor or health care professional if they continue or are bothersome): -aches or pains -chickenpox-like rash -diarrhea -headache -loss of appetite -nausea, vomiting -redness, pain, swelling at site where injected -runny nose This list may not describe all possible side effects. Call your doctor for medical advice about side effects. You may report side effects to FDA at 1-800-FDA-1088. Where should I keep my medicine? This drug is given in a hospital or clinic and will not be stored at home. NOTE: This sheet is a summary. It may not cover all possible information. If you have questions about this medicine, talk to your doctor, pharmacist, or health care provider.  2012, Elsevier/Gold Standard. (01/04/2010 5:43:50 PM) 

## 2012-05-02 ENCOUNTER — Encounter: Payer: Self-pay | Admitting: Family Medicine

## 2012-05-02 ENCOUNTER — Ambulatory Visit (INDEPENDENT_AMBULATORY_CARE_PROVIDER_SITE_OTHER): Payer: Medicare Other | Admitting: Family Medicine

## 2012-05-02 VITALS — BP 114/63 | HR 53 | Temp 97.8°F | Resp 16 | Ht 65.5 in | Wt 218.0 lb

## 2012-05-02 DIAGNOSIS — M1710 Unilateral primary osteoarthritis, unspecified knee: Secondary | ICD-10-CM

## 2012-05-02 DIAGNOSIS — F32A Depression, unspecified: Secondary | ICD-10-CM

## 2012-05-02 DIAGNOSIS — F329 Major depressive disorder, single episode, unspecified: Secondary | ICD-10-CM

## 2012-05-02 DIAGNOSIS — F3289 Other specified depressive episodes: Secondary | ICD-10-CM

## 2012-05-02 DIAGNOSIS — M1712 Unilateral primary osteoarthritis, left knee: Secondary | ICD-10-CM

## 2012-05-02 NOTE — Progress Notes (Signed)
66 yo with recent left knee arthritis and depression.  Mr. Gush is now cheerful and attributes this to his knee feeling much better.  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  Assessment:  Resolved depression, arthritis no longer a factro  Plan:  RTC prn

## 2012-08-27 ENCOUNTER — Ambulatory Visit (INDEPENDENT_AMBULATORY_CARE_PROVIDER_SITE_OTHER): Payer: Medicare Other | Admitting: Family Medicine

## 2012-08-27 VITALS — BP 148/90 | HR 86 | Temp 98.8°F | Resp 16 | Ht 65.0 in | Wt 223.0 lb

## 2012-08-27 DIAGNOSIS — I10 Essential (primary) hypertension: Secondary | ICD-10-CM

## 2012-08-27 NOTE — Progress Notes (Signed)
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 [name] feeling better than he has a long time.  Objective: No acute distress blood pressure recheck is 120/70 HEENT: Unremarkable Chest: Clear Heart: Regular no murmur Abdomen soft protuberant without masses Genitalia normal circumcised male with no hernia Extremities: Unremarkable Neuro: Normal mental status, normal cranial nerves, normal movement of neck and 4 extremities  Assessment: Testis DOT Plan continue current blood pressure medicine in the next year

## 2012-08-30 ENCOUNTER — Encounter: Payer: Self-pay | Admitting: Family Medicine

## 2012-09-18 ENCOUNTER — Other Ambulatory Visit: Payer: Self-pay | Admitting: Family Medicine

## 2013-02-04 ENCOUNTER — Telehealth: Payer: Self-pay

## 2013-02-04 DIAGNOSIS — E785 Hyperlipidemia, unspecified: Secondary | ICD-10-CM

## 2013-02-04 DIAGNOSIS — I2510 Atherosclerotic heart disease of native coronary artery without angina pectoris: Secondary | ICD-10-CM

## 2013-02-04 MED ORDER — LISINOPRIL 10 MG PO TABS: 10.0000 mg | ORAL_TABLET | Freq: Every day | ORAL | Status: DC

## 2013-02-04 NOTE — Telephone Encounter (Signed)
Patient advised.

## 2013-02-04 NOTE — Telephone Encounter (Signed)
Last written by Dr Eden Emms. Do you want to take this over now? pended

## 2013-02-04 NOTE — Telephone Encounter (Signed)
Patient needs a refill on Lisinopril. He is completely out. Patient uses Counselling psychologist

## 2013-02-04 NOTE — Telephone Encounter (Signed)
Please let patient know I have refilled his lisinopril

## 2013-03-07 ENCOUNTER — Other Ambulatory Visit: Payer: Self-pay | Admitting: Family Medicine

## 2013-03-07 NOTE — Telephone Encounter (Signed)
Patient is requesting 90 suppy of lisinopril (PRINIVIL,ZESTRIL) 10 MG tablet   [phone]

## 2013-04-10 ENCOUNTER — Other Ambulatory Visit: Payer: Self-pay | Admitting: Family Medicine

## 2013-04-10 ENCOUNTER — Telehealth: Payer: Self-pay

## 2013-04-10 DIAGNOSIS — E785 Hyperlipidemia, unspecified: Secondary | ICD-10-CM

## 2013-04-10 DIAGNOSIS — K219 Gastro-esophageal reflux disease without esophagitis: Secondary | ICD-10-CM

## 2013-04-10 DIAGNOSIS — I2510 Atherosclerotic heart disease of native coronary artery without angina pectoris: Secondary | ICD-10-CM

## 2013-04-10 MED ORDER — LISINOPRIL 10 MG PO TABS: 10.0000 mg | ORAL_TABLET | Freq: Every day | ORAL | Status: DC

## 2013-04-10 MED ORDER — ESOMEPRAZOLE MAGNESIUM 40 MG PO CPDR: 40.0000 mg | DELAYED_RELEASE_CAPSULE | Freq: Every day | ORAL | Status: DC

## 2013-04-10 NOTE — Telephone Encounter (Signed)
Patient is saying that the pharmacy will not refill his meds because we wont talk to them??? Please call patient at [phone]

## 2013-04-10 NOTE — Telephone Encounter (Signed)
He called back, but I was on other line, called him again. He needs Lisinopril for 90 day supply this is sent, he also needs nexium for 90 day pended please advise. Patient has physical scheduled, could not get in until Nov.

## 2013-04-10 NOTE — Telephone Encounter (Signed)
Sent.  FOllow up as planned

## 2013-04-10 NOTE — Telephone Encounter (Signed)
Called him, what on earth is he referring to ? I have not gotten a call nor a Rx request from his pharmacy. Left message for him to call me back and advise.

## 2013-04-10 NOTE — Telephone Encounter (Signed)
Pt notified that rx was sent in 

## 2013-04-30 ENCOUNTER — Other Ambulatory Visit: Payer: Self-pay | Admitting: Physician Assistant

## 2013-04-30 ENCOUNTER — Other Ambulatory Visit: Payer: Self-pay | Admitting: Family Medicine

## 2013-05-02 ENCOUNTER — Other Ambulatory Visit: Payer: Self-pay | Admitting: Family Medicine

## 2013-05-02 ENCOUNTER — Other Ambulatory Visit: Payer: Self-pay | Admitting: Physician Assistant

## 2013-05-17 ENCOUNTER — Other Ambulatory Visit: Payer: Self-pay | Admitting: Family Medicine

## 2013-05-17 ENCOUNTER — Other Ambulatory Visit: Payer: Self-pay | Admitting: Physician Assistant

## 2013-06-04 ENCOUNTER — Telehealth: Payer: Self-pay | Admitting: Radiology

## 2013-06-04 NOTE — Telephone Encounter (Signed)
Spoke to pharmacy, [facility] advised patient needs office visit for renewals.

## 2013-06-13 ENCOUNTER — Encounter: Payer: Self-pay | Admitting: Family Medicine

## 2013-06-13 ENCOUNTER — Ambulatory Visit (INDEPENDENT_AMBULATORY_CARE_PROVIDER_SITE_OTHER): Payer: Medicare Other | Admitting: Family Medicine

## 2013-06-13 VITALS — BP 132/64 | HR 57 | Temp 98.0°F | Resp 18 | Ht 65.25 in | Wt 218.8 lb

## 2013-06-13 DIAGNOSIS — R5381 Other malaise: Secondary | ICD-10-CM

## 2013-06-13 DIAGNOSIS — Z0000 Encounter for general adult medical examination without abnormal findings: Secondary | ICD-10-CM

## 2013-06-13 DIAGNOSIS — Z0289 Encounter for other administrative examinations: Secondary | ICD-10-CM

## 2013-06-13 DIAGNOSIS — Z139 Encounter for screening, unspecified: Secondary | ICD-10-CM

## 2013-06-13 DIAGNOSIS — R1030 Lower abdominal pain, unspecified: Secondary | ICD-10-CM

## 2013-06-13 DIAGNOSIS — R5383 Other fatigue: Secondary | ICD-10-CM

## 2013-06-13 DIAGNOSIS — Z23 Encounter for immunization: Secondary | ICD-10-CM

## 2013-06-13 DIAGNOSIS — R109 Unspecified abdominal pain: Secondary | ICD-10-CM

## 2013-06-13 DIAGNOSIS — K219 Gastro-esophageal reflux disease without esophagitis: Secondary | ICD-10-CM

## 2013-06-13 LAB — CBC WITH DIFFERENTIAL/PLATELET
Basophils Absolute: 0 10*3/uL (ref 0.0–0.1)
Basophils Relative: 1 % (ref 0–1)
Eosinophils Absolute: 0.3 10*3/uL (ref 0.0–0.7)
Eosinophils Relative: 4 % (ref 0–5)
HCT: 42.5 % (ref 39.0–52.0)
Hemoglobin: 15.1 g/dL (ref 13.0–17.0)
Lymphocytes Relative: 37 % (ref 12–46)
Lymphs Abs: 2.9 10*3/uL (ref 0.7–4.0)
MCH: 31.5 pg (ref 26.0–34.0)
MCHC: 35.5 g/dL (ref 30.0–36.0)
MCV: 88.5 fL (ref 78.0–100.0)
Monocytes Absolute: 0.7 10*3/uL (ref 0.1–1.0)
Monocytes Relative: 9 % (ref 3–12)
Neutro Abs: 3.9 10*3/uL (ref 1.7–7.7)
Neutrophils Relative %: 49 % (ref 43–77)
Platelets: 168 10*3/uL (ref 150–400)
RBC: 4.8 MIL/uL (ref 4.22–5.81)
RDW: 12.7 % (ref 11.5–15.5)
WBC: 7.8 10*3/uL (ref 4.0–10.5)

## 2013-06-13 LAB — COMPREHENSIVE METABOLIC PANEL
ALT: 26 U/L (ref 0–53)
AST: 22 U/L (ref 0–37)
Albumin: 4.4 g/dL (ref 3.5–5.2)
Alkaline Phosphatase: 54 U/L (ref 39–117)
BUN: 19 mg/dL (ref 6–23)
CO2: 20 mEq/L (ref 19–32)
Calcium: 9.3 mg/dL (ref 8.4–10.5)
Chloride: 105 mEq/L (ref 96–112)
Creat: 1.05 mg/dL (ref 0.50–1.35)
Glucose, Bld: 101 mg/dL — ABNORMAL HIGH (ref 70–99)
Potassium: 4.1 mEq/L (ref 3.5–5.3)
Sodium: 138 mEq/L (ref 135–145)
Total Bilirubin: 0.5 mg/dL (ref 0.3–1.2)
Total Protein: 7.3 g/dL (ref 6.0–8.3)

## 2013-06-13 LAB — PSA: PSA: 4.44 ng/mL — ABNORMAL HIGH (ref <=4.00)

## 2013-06-13 LAB — POCT URINALYSIS DIPSTICK
Bilirubin, UA: NEGATIVE
Blood, UA: NEGATIVE
Glucose, UA: NEGATIVE
Ketones, UA: NEGATIVE
Leukocytes, UA: NEGATIVE
Nitrite, UA: NEGATIVE
Protein, UA: NEGATIVE
Spec Grav, UA: 1.025
Urobilinogen, UA: 0.2
pH, UA: 5.5

## 2013-06-13 LAB — LIPID PANEL
Cholesterol: 104 mg/dL (ref 0–200)
HDL: 38 mg/dL — ABNORMAL LOW (ref >39)
LDL Cholesterol: 44 mg/dL (ref 0–99)
Total CHOL/HDL Ratio: 2.7 Ratio
Triglycerides: 109 mg/dL (ref <150)
VLDL: 22 mg/dL (ref 0–40)

## 2013-06-13 LAB — IFOBT (OCCULT BLOOD): IFOBT: NEGATIVE

## 2013-06-13 MED ORDER — MELOXICAM 7.5 MG PO TABS: 7.5000 mg | ORAL_TABLET | Freq: Every day | ORAL | Status: DC

## 2013-06-13 MED ORDER — METOPROLOL TARTRATE 25 MG PO TABS: ORAL_TABLET | ORAL | Status: DC

## 2013-06-13 MED ORDER — ROSUVASTATIN CALCIUM 40 MG PO TABS: 40.0000 mg | ORAL_TABLET | Freq: Every day | ORAL | Status: DC

## 2013-06-13 MED ORDER — CLOPIDOGREL BISULFATE 75 MG PO TABS: 75.0000 mg | ORAL_TABLET | Freq: Once | ORAL | Status: DC

## 2013-06-13 MED ORDER — ESOMEPRAZOLE MAGNESIUM 40 MG PO CPDR: 40.0000 mg | DELAYED_RELEASE_CAPSULE | Freq: Every day | ORAL | Status: DC

## 2013-06-13 NOTE — Progress Notes (Signed)
  Subjective:    Patient ID: Shawn Meza, male    DOB: 09-20-1945, 67 y.o.   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 His depression is in remission and never wants to take prozac again as it made him feel loopy Review of Systems  Constitutional: Negative.   HENT: Positive for congestion, postnasal drip and rhinorrhea.   Eyes: Positive for itching.  Respiratory: Positive for cough.   Cardiovascular: Negative.   Gastrointestinal: Negative.   Endocrine: Negative.   Genitourinary: Negative.   Musculoskeletal: Positive for arthralgias.  Skin: Negative.   Allergic/Immunologic: Negative.   Neurological: Negative.   Hematological: Bruises/bleeds easily.  Psychiatric/Behavioral: Negative.        Objective:   Physical Exam NAD HEENT:  Small pupils, normal fundi, normal oroph, normal hearing and ears Neck:  No bruits, supple, no adenopathy or thyromegaly Chest:  Clear Heart:  Reg, no murmur Abdomen:  Small umbilical hernia, no HSM Rectal:  Right prostate nodule, no hemorrhoids Genitalia:  Normal circ male. No hernia Skin:  Multiple seborrheic keratosis Neuro:  Alert, moving 4 extrem equally Knees:  Left knee FROM with crepitus; other joints normal    Assessment & Plan:  Annual physical exam - Plan: Comprehensive metabolic panel, Lipid panel, POCT urinalysis dipstick, PSA, IFOBT POC (occult bld, rslt in office), rosuvastatin (CRESTOR) 40 MG tablet, clopidogrel (PLAVIX) 75 MG tablet, esomeprazole (NEXIUM) 40 MG capsule, meloxicam (MOBIC) 7.5 MG tablet, metoprolol tartrate (LOPRESSOR) 25 MG tablet, CBC with Differential, CANCELED: POCT CBC  Need for prophylactic vaccination and inoculation against influenza - Plan: Flu Vaccine QUAD 36+ mos IM  GERD (gastroesophageal reflux disease) - Plan: esomeprazole (NEXIUM) 40 MG capsule  Groin pain - Plan: metoprolol tartrate (LOPRESSOR) 25 MG  tablet  Fatigue - Plan: metoprolol tartrate (LOPRESSOR) 25 MG tablet  Signed, Elvina Sidle, MD

## 2013-06-13 NOTE — Patient Instructions (Signed)
Stop lisinopril because it can cause your cough.

## 2013-07-16 ENCOUNTER — Other Ambulatory Visit (INDEPENDENT_AMBULATORY_CARE_PROVIDER_SITE_OTHER): Payer: Medicare Other

## 2013-07-16 DIAGNOSIS — R972 Elevated prostate specific antigen [PSA]: Secondary | ICD-10-CM

## 2013-07-17 LAB — PSA: PSA: 4.38 ng/mL — ABNORMAL HIGH (ref <=4.00)

## 2013-07-24 ENCOUNTER — Telehealth: Payer: Self-pay | Admitting: Family Medicine

## 2013-07-25 NOTE — Telephone Encounter (Signed)
Patient called.

## 2013-07-25 NOTE — Progress Notes (Signed)
Patient notified

## 2014-02-13 ENCOUNTER — Ambulatory Visit (INDEPENDENT_AMBULATORY_CARE_PROVIDER_SITE_OTHER): Payer: Medicare Other | Admitting: Family Medicine

## 2014-02-13 ENCOUNTER — Encounter: Payer: Self-pay | Admitting: Family Medicine

## 2014-02-13 VITALS — BP 130/70 | HR 57 | Temp 97.9°F | Resp 16 | Ht 65.0 in | Wt 223.0 lb

## 2014-02-13 DIAGNOSIS — I10 Essential (primary) hypertension: Secondary | ICD-10-CM

## 2014-02-13 DIAGNOSIS — F3289 Other specified depressive episodes: Secondary | ICD-10-CM

## 2014-02-13 DIAGNOSIS — M542 Cervicalgia: Secondary | ICD-10-CM

## 2014-02-13 DIAGNOSIS — Z85038 Personal history of other malignant neoplasm of large intestine: Secondary | ICD-10-CM

## 2014-02-13 DIAGNOSIS — M25569 Pain in unspecified knee: Secondary | ICD-10-CM

## 2014-02-13 DIAGNOSIS — H01009 Unspecified blepharitis unspecified eye, unspecified eyelid: Secondary | ICD-10-CM

## 2014-02-13 DIAGNOSIS — L259 Unspecified contact dermatitis, unspecified cause: Secondary | ICD-10-CM

## 2014-02-13 DIAGNOSIS — F329 Major depressive disorder, single episode, unspecified: Secondary | ICD-10-CM

## 2014-02-13 DIAGNOSIS — I2510 Atherosclerotic heart disease of native coronary artery without angina pectoris: Secondary | ICD-10-CM

## 2014-02-13 DIAGNOSIS — M25562 Pain in left knee: Secondary | ICD-10-CM

## 2014-02-13 DIAGNOSIS — E785 Hyperlipidemia, unspecified: Secondary | ICD-10-CM

## 2014-02-13 DIAGNOSIS — F32A Depression, unspecified: Secondary | ICD-10-CM

## 2014-02-13 DIAGNOSIS — Z0000 Encounter for general adult medical examination without abnormal findings: Secondary | ICD-10-CM

## 2014-02-13 DIAGNOSIS — L309 Dermatitis, unspecified: Secondary | ICD-10-CM

## 2014-02-13 LAB — CBC WITH DIFFERENTIAL/PLATELET
Basophils Absolute: 0.1 10*3/uL (ref 0.0–0.1)
Basophils Relative: 1 % (ref 0–1)
Eosinophils Absolute: 0.3 10*3/uL (ref 0.0–0.7)
Eosinophils Relative: 4 % (ref 0–5)
HCT: 43.6 % (ref 39.0–52.0)
Hemoglobin: 15.6 g/dL (ref 13.0–17.0)
Lymphocytes Relative: 38 % (ref 12–46)
Lymphs Abs: 3.3 10*3/uL (ref 0.7–4.0)
MCH: 31.1 pg (ref 26.0–34.0)
MCHC: 35.8 g/dL (ref 30.0–36.0)
MCV: 86.9 fL (ref 78.0–100.0)
Monocytes Absolute: 0.8 10*3/uL (ref 0.1–1.0)
Monocytes Relative: 9 % (ref 3–12)
Neutro Abs: 4.2 10*3/uL (ref 1.7–7.7)
Neutrophils Relative %: 48 % (ref 43–77)
Platelets: 182 10*3/uL (ref 150–400)
RBC: 5.02 MIL/uL (ref 4.22–5.81)
RDW: 14.1 % (ref 11.5–15.5)
WBC: 8.7 10*3/uL (ref 4.0–10.5)

## 2014-02-13 LAB — POCT URINALYSIS DIPSTICK
Bilirubin, UA: NEGATIVE
Blood, UA: NEGATIVE
Glucose, UA: NEGATIVE
Ketones, UA: NEGATIVE
Leukocytes, UA: NEGATIVE
Nitrite, UA: NEGATIVE
Spec Grav, UA: 1.025
Urobilinogen, UA: 0.2
pH, UA: 5

## 2014-02-13 LAB — COMPREHENSIVE METABOLIC PANEL
ALT: 43 U/L (ref 0–53)
AST: 34 U/L (ref 0–37)
Albumin: 4.2 g/dL (ref 3.5–5.2)
Alkaline Phosphatase: 60 U/L (ref 39–117)
BUN: 16 mg/dL (ref 6–23)
CO2: 22 mEq/L (ref 19–32)
Calcium: 9.2 mg/dL (ref 8.4–10.5)
Chloride: 105 mEq/L (ref 96–112)
Creat: 0.87 mg/dL (ref 0.50–1.35)
Glucose, Bld: 94 mg/dL (ref 70–99)
Potassium: 4.2 mEq/L (ref 3.5–5.3)
Sodium: 139 mEq/L (ref 135–145)
Total Bilirubin: 0.6 mg/dL (ref 0.2–1.2)
Total Protein: 7.2 g/dL (ref 6.0–8.3)

## 2014-02-13 LAB — IFOBT (OCCULT BLOOD): IFOBT: POSITIVE

## 2014-02-13 LAB — LIPID PANEL
Cholesterol: 107 mg/dL (ref 0–200)
HDL: 37 mg/dL — ABNORMAL LOW (ref >39)
LDL Cholesterol: 36 mg/dL (ref 0–99)
Total CHOL/HDL Ratio: 2.9 Ratio
Triglycerides: 168 mg/dL — ABNORMAL HIGH (ref <150)
VLDL: 34 mg/dL (ref 0–40)

## 2014-02-13 MED ORDER — ROSUVASTATIN CALCIUM 40 MG PO TABS: 40.0000 mg | ORAL_TABLET | Freq: Every day | ORAL | Status: DC

## 2014-02-13 MED ORDER — MELOXICAM 7.5 MG PO TABS: ORAL_TABLET | ORAL | Status: DC

## 2014-02-13 MED ORDER — BETAMETHASONE DIPROPIONATE 0.05 % EX CREA: TOPICAL_CREAM | Freq: Two times a day (BID) | CUTANEOUS | Status: DC

## 2014-02-13 MED ORDER — PREDNISONE 20 MG PO TABS: 20.0000 mg | ORAL_TABLET | Freq: Every day | ORAL | Status: DC

## 2014-02-13 MED ORDER — CLOPIDOGREL BISULFATE 75 MG PO TABS: 75.0000 mg | ORAL_TABLET | Freq: Once | ORAL | Status: DC

## 2014-02-13 MED ORDER — TOBRAMYCIN 0.3 % OP SOLN: 1.0000 [drp] | OPHTHALMIC | Status: DC

## 2014-02-13 MED ORDER — FLUOXETINE HCL 20 MG PO TABS: 20.0000 mg | ORAL_TABLET | Freq: Every day | ORAL | Status: DC

## 2014-02-13 MED ORDER — METOPROLOL TARTRATE 25 MG PO TABS: ORAL_TABLET | ORAL | Status: DC

## 2014-02-13 NOTE — Progress Notes (Signed)
   Subjective:    Patient ID: Shawn Meza, male    DOB: 10-22-1945, 68 y.o.   MRN: [id number]  HPI    Review of Systems  Constitutional: Negative.   HENT: Positive for congestion and rhinorrhea.   Eyes: Positive for itching.  Respiratory: Negative.   Cardiovascular: Negative.   Gastrointestinal: Negative.   Endocrine: Negative.   Genitourinary: Negative.   Musculoskeletal: Positive for arthralgias and joint swelling.  Skin: Negative.   Allergic/Immunologic: Negative.   Neurological: Negative.   Hematological: Negative.   Psychiatric/Behavioral: Negative.        Objective:   Physical Exam        Assessment & Plan:  Please see complete note below

## 2014-02-13 NOTE — Progress Notes (Signed)
Patient ID: Shawn Meza MRN: [id number], DOB: April 03, 1946 68 y.o. 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  Doing well. Notes recent blepharitis after handling bird seed.  Had surgery for colon ca two years ago and has been asked to return for follow up but he does not trust those doctors because his operation took 8 hours instead of the predicted 2 hours and nobody could explain why the surgery took so long.    Review of Systems: Consitutional: No fever, chills, fatigue, night sweats, lymphadenopathy, or weight changes. Eyes: No visual changes, eye redness, but does have lid margin discharge. ENT/Mouth: Ears: No otalgia, tinnitus, hearing loss, discharge. Nose: No congestion, rhinorrhea, sinus pain, or epistaxis. Throat: No sore throat, post nasal drip, or teeth pain. Cardiovascular: No CP, palpitations, diaphoresis, DOE, edema, orthopnea, PND. Respiratory: No cough, hemoptysis, SOB, or wheezing. Gastrointestinal: No anorexia, dysphagia, reflux, pain, nausea, vomiting, hematemesis, diarrhea, constipation, BRBPR, or melena. Genitourinary: No dysuria, frequency, urgency, hematuria, incontinence, nocturia, decreased urinary stream, discharge, impotence, or testicular pain/masses. Musculoskeletal: No decreased ROM, myalgias, stiffness, joint swelling, or weakness. Skin: No erythema, lesion changes, pain, warmth, jaundice, or pruritis. Neurological: No headache, dizziness, syncope, seizures, tremors, memory loss, coordination problems, or paresthesias. Psychological: No anxiety, depression, hallucinations, SI/HI. Endocrine: No fatigue, polydipsia, polyphagia, polyuria, or known diabetes. All other systems were reviewed and are otherwise negative.  Past Medical History  Diagnosis Date  . GERD (gastroesophageal reflux disease)   .  Headache(784.0)   . Hyperlipidemia   . Hypertension   . Nephrolithiasis     hx of  . Transient ischemic attack     hx of  . BPH (benign prostatic hypertrophy)   . CAD (coronary artery disease)   . Colon cancer   . Medial meniscus tear 10/11/2011     Past Surgical History  Procedure Laterality Date  . Transurethral resection of prostate    . Cardiac catheterization  5/12,1/13  . Knee arthroscopy  10/11/2011    Procedure: ARTHROSCOPY KNEE;  Surgeon: Lorn Junes, MD;  Location: [place] [facility];  Service: Orthopedics;  Laterality: Left;  Left Knee Arthroscopy with Medial and Lateral Partial Menisectomy, Chondroplasty  . Steriod injection  10/11/2011    Procedure: STEROID INJECTION;  Surgeon: Lorn Junes, MD;  Location: [place];  Service: Orthopedics;  Laterality: Right;  Steroid Injection Second Toe    Home Meds:  Prior to Admission medications   Medication Sig Start Date End Date Taking? Authorizing Provider  aspirin 81 MG tablet Take 81 mg by mouth daily.      Historical Provider, MD  betamethasone dipropionate (DIPROLENE) 0.05 % cream Apply topically 2 (two) times daily. 04/18/12   Robyn Haber, MD  clopidogrel (PLAVIX) 75 MG tablet Take 1 tablet (75 mg total) by mouth once. 06/13/13   Robyn Haber, MD  esomeprazole (NEXIUM) 40 MG capsule Take 1 capsule (40 mg total) by mouth daily before breakfast. 06/13/13   Robyn Haber, MD  FLUoxetine (PROZAC) 20 MG tablet Take 1 tablet (20 mg total) by mouth daily. 04/06/12 07/05/12  Ryan M Dunn, PA-C  meloxicam (MOBIC) 7.5 MG tablet Take 1 tablet (7.5 mg total) by mouth daily. NEED VISIT! 06/13/13   Robyn Haber, MD  metoprolol tartrate (LOPRESSOR) 25 MG tablet One half tablet twice daily. 06/13/13   Robyn Haber, MD  nitroGLYCERIN (NITROSTAT)  0.4 MG SL tablet Place 1 tablet (0.4 mg total) under the tongue every 5 (five) minutes as needed. Chest pain 04/06/12   Rise Mu, PA-C  rosuvastatin (CRESTOR)  40 MG tablet Take 1 tablet (40 mg total) by mouth daily. 06/13/13   Robyn Haber, MD    Allergies:  Allergies  Allergen Reactions  . Testosterone     REACTION: abd pain    History   Social History  . Marital Status: Married    Spouse Name: N/A    Number of Children: N/A  . Years of Education: N/A   Occupational History  . retired    Social History Main Topics  . Smoking status: Never Smoker   . Smokeless tobacco: Not on file  . Alcohol Use: No  . Drug Use: No  . Sexual Activity: Not on file   Other Topics Concern  . Not on file   Social History Narrative  . No narrative on file    Family History  Problem Relation Age of Onset  . Coronary artery disease Other     family hx of male 1st degree relative ,3  . Hyperlipidemia      family hx of  . Hypertension      family hx of  . Arthritis      family hx of    Physical Exam: Blood pressure 130/70, pulse 57, temperature 97.9 F (36.6 C), temperature source Oral, resp. rate 16, height 5\' 5"  (1.651 m), weight 223 lb (101.152 kg), SpO2 96.00%.  BP Readings from Last 3 Encounters:  02/13/14 130/70  06/13/13 132/64  08/27/12 148/90   General: Well developed, well nourished, in no acute distress. HEENT: Normocephalic, atraumatic. Conjunctiva pink, sclera non-icteric. Pupils 2 mm constricting to 1 mm, round, regular, and equally reactive to light and accomodation. EOMI. Internal auditory canal clear. TMs with good cone of light and without pathology. Nasal mucosa pink. Nares are without discharge. No sinus tenderness. Oral mucosa pink. Dentition -missing several teeth with caries. Pharynx without exudate.    Neck: Supple. Trachea midline. No thyromegaly. Full ROM. No lymphadenopathy. Lungs: Clear to auscultation bilaterally without wheezes, rales, or rhonchi. Breathing is of normal effort and unlabored. Cardiovascular: RRR with S1 S2. No murmurs, rubs, or gallops appreciated. Distal pulses 2+ symmetrically. No  carotid or abdominal bruits Abdomen: Soft, non-tender, non-distended with normoactive bowel sounds. No hepatosplenomegaly or masses. No rebound/guarding. No CVA tenderness. Without hernias. Small umbilical hernia Rectal: No external hemorrhoids or fissures. Rectal vault without masses.  Genitourinary:  circumcised male. No penile lesions. Testes descended bilaterally, and smooth without tenderness or masses.  Musculoskeletal: Full range of motion and 5/5 strength throughout. Without swelling, atrophy, tenderness, crepitus, or warmth. Extremities without clubbing, cyanosis, or edema. Calves supple. Skin: Warm and moist without erythema, ecchymosis, wounds.  Scaly forearm rash.  Multiple seborrheic keratoses on torso Neuro: A+Ox3. CN II-XII grossly intact. Moves all extremities spontaneously. Full sensation throughout. Normal gait. DTR 2+ throughout upper and lower extremities. Finger to nose intact. Psych:  Responds to questions appropriately with a normal affect.   Lab Results  Component Value Date   CHOL 104 06/13/2013   CHOL 190 04/04/2012   CHOL 101 11/30/2011   Lab Results  Component Value Date   HDL 38* 06/13/2013   HDL 38* 04/04/2012   HDL 40.30 11/30/2011   Lab Results  Component Value Date   LDLCALC 44 06/13/2013   LDLCALC 112* 04/04/2012   LDLCALC 36 11/30/2011   Lab Results  Component Value Date   TRIG 109 06/13/2013   TRIG 202* 04/04/2012   TRIG 125.0 11/30/2011   Lab Results  Component Value Date   CHOLHDL 2.7 06/13/2013   CHOLHDL 5.0 04/04/2012   CHOLHDL 3 11/30/2011      Right eye Left eye Both eyes   Without correction 20/25 20/25 20/20       Vision Screening on 06/13/2013 Edited by: Nickolas Madrid, CMA     Right eye Left eye Both eyes   Without correction 20/25 20/25 20/25         Assessment/Plan:  68 y.o. y/o retired male here for CPE Eczema - Plan: betamethasone dipropionate (DIPROLENE) 0.05 % cream, CBC  Neck pain - Plan: predniSONE (DELTASONE) 20 MG tablet,  meloxicam (MOBIC) 7.5 MG tablet, CBC  Left knee pain - Plan: predniSONE (DELTASONE) 20 MG tablet, meloxicam (MOBIC) 7.5 MG tablet, CBC  Annual physical exam - Plan: CBC, PSA, POCT urinalysis dipstick  Blepharitis, unspecified laterality - Plan: tobramycin (TOBREX) 0.3 % ophthalmic solution, CBC  Other and unspecified hyperlipidemia - Plan: rosuvastatin (CRESTOR) 40 MG tablet, CBC  Essential hypertension - Plan: metoprolol tartrate (LOPRESSOR) 25 MG tablet, CBC  CAD, multiple vessel - Plan: clopidogrel (PLAVIX) 75 MG tablet, CBC, Comprehensive metabolic panel, Lipid panel  Depression - Plan: FLUoxetine (PROZAC) 20 MG tablet, CBC  H/O colon cancer, stage I - Plan: IFOBT POC (occult bld, rslt in office)   Signed, Robyn Haber, MD 02/13/2014 1:30 PM

## 2014-02-13 NOTE — Patient Instructions (Addendum)

## 2014-02-14 LAB — PSA: PSA: 4.71 ng/mL — ABNORMAL HIGH (ref <=4.00)

## 2014-04-24 ENCOUNTER — Telehealth: Payer: Self-pay

## 2014-04-24 DIAGNOSIS — E785 Hyperlipidemia, unspecified: Secondary | ICD-10-CM

## 2014-04-24 MED ORDER — ATORVASTATIN CALCIUM 20 MG PO TABS: 20.0000 mg | ORAL_TABLET | Freq: Every day | ORAL | Status: DC

## 2014-04-24 NOTE — Telephone Encounter (Signed)
I will order lipitor at a lower dose

## 2014-04-24 NOTE — Telephone Encounter (Signed)
Patient requesting to change his cholesterol medication "Rosuvastatin(Crestor) 40mg " to a different cholesterol medication. Per patient since he has been taking it his whole body aches and has soreness. Patient feels it is a side effect from the medication because it contains "satin". Patient requesting new medication to be sent to [place] and his call back number is [phone]

## 2014-04-25 NOTE — Telephone Encounter (Signed)
Pt advised.

## 2014-07-10 ENCOUNTER — Encounter (HOSPITAL_COMMUNITY): Payer: Self-pay | Admitting: Cardiovascular Disease

## 2014-07-29 ENCOUNTER — Other Ambulatory Visit: Payer: Self-pay | Admitting: Family Medicine

## 2014-07-30 ENCOUNTER — Telehealth: Payer: Self-pay

## 2014-07-30 NOTE — Telephone Encounter (Signed)
Dr Carlean Jews, pharm faxed req to change betamethasone to a medication that pt stated he "spoke with doctor and there is another medication that is more effective than this one". Do you know what pt is referring to, and want to change?

## 2014-07-31 MED ORDER — FLUOCINONIDE-E 0.05 % EX CREA: 1.0000 "application " | TOPICAL_CREAM | Freq: Two times a day (BID) | CUTANEOUS | Status: DC

## 2014-11-04 ENCOUNTER — Encounter: Payer: Self-pay | Admitting: *Deleted

## 2015-03-25 ENCOUNTER — Other Ambulatory Visit: Payer: Self-pay | Admitting: Physician Assistant

## 2015-03-25 ENCOUNTER — Other Ambulatory Visit: Payer: Self-pay | Admitting: Family Medicine

## 2015-03-26 ENCOUNTER — Other Ambulatory Visit: Payer: Self-pay

## 2015-03-26 NOTE — Telephone Encounter (Signed)
Pt has appt in Oct

## 2015-05-19 ENCOUNTER — Ambulatory Visit (INDEPENDENT_AMBULATORY_CARE_PROVIDER_SITE_OTHER): Payer: Medicare Other

## 2015-05-19 ENCOUNTER — Ambulatory Visit (INDEPENDENT_AMBULATORY_CARE_PROVIDER_SITE_OTHER): Payer: Medicare Other | Admitting: Family Medicine

## 2015-05-19 DIAGNOSIS — M479 Spondylosis, unspecified: Secondary | ICD-10-CM

## 2015-05-19 DIAGNOSIS — M542 Cervicalgia: Secondary | ICD-10-CM | POA: Diagnosis not present

## 2015-05-19 DIAGNOSIS — G44319 Acute post-traumatic headache, not intractable: Secondary | ICD-10-CM

## 2015-05-19 DIAGNOSIS — M549 Dorsalgia, unspecified: Secondary | ICD-10-CM

## 2015-05-19 DIAGNOSIS — M545 Low back pain, unspecified: Secondary | ICD-10-CM

## 2015-05-19 DIAGNOSIS — M546 Pain in thoracic spine: Secondary | ICD-10-CM | POA: Diagnosis not present

## 2015-05-19 MED ORDER — MELOXICAM 7.5 MG PO TABS: ORAL_TABLET | ORAL | Status: DC

## 2015-05-19 MED ORDER — HYDROCODONE-ACETAMINOPHEN 5-325 MG PO TABS: 1.0000 | ORAL_TABLET | Freq: Four times a day (QID) | ORAL | Status: DC | PRN

## 2015-05-19 MED ORDER — METHOCARBAMOL 500 MG PO TABS: ORAL_TABLET | ORAL | Status: DC

## 2015-05-19 NOTE — Patient Instructions (Signed)
Take the muscle relaxant, methocarbamol, one pill 3 times daily with meals and 2 at bedtime. If this makes you to drowsy decrease the dose to just a half a pill each time with meals.  Take the meloxicam for pain and inflammation, one pill twice daily for about 5 days, then once daily. Make sure you take this with meals. Discontinue if it upsets her stomach.  Take the hydrocodone pain pills one every 6 hours if needed for more severe pain. This can cause constipation.  Use some heat to your back as needed for the discomfort but be cautious not to burn himself  Although you need to rest your back after an accident like this, you still need to get up and start around a little bit to keep it stretched out so it doesn't freeze up too much.  Expect to feel worse tomorrow and maybe the next day, but you should be improving after that. If not considerably better over the next 10 days or so please return.
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
## 2015-05-20 ENCOUNTER — Ambulatory Visit (INDEPENDENT_AMBULATORY_CARE_PROVIDER_SITE_OTHER): Payer: Medicare Other | Admitting: Family Medicine

## 2015-05-20 ENCOUNTER — Encounter: Payer: Self-pay | Admitting: Family Medicine

## 2015-05-20 VITALS — BP 120/72 | HR 82 | Temp 98.1°F | Resp 16 | Ht 65.0 in | Wt 223.2 lb

## 2015-05-20 DIAGNOSIS — I2584 Coronary atherosclerosis due to calcified coronary lesion: Secondary | ICD-10-CM | POA: Diagnosis not present

## 2015-05-20 DIAGNOSIS — Z131 Encounter for screening for diabetes mellitus: Secondary | ICD-10-CM | POA: Diagnosis not present

## 2015-05-20 DIAGNOSIS — I2510 Atherosclerotic heart disease of native coronary artery without angina pectoris: Secondary | ICD-10-CM | POA: Diagnosis not present

## 2015-05-20 DIAGNOSIS — Z125 Encounter for screening for malignant neoplasm of prostate: Secondary | ICD-10-CM | POA: Diagnosis not present

## 2015-05-20 DIAGNOSIS — Z85038 Personal history of other malignant neoplasm of large intestine: Secondary | ICD-10-CM | POA: Diagnosis not present

## 2015-05-20 DIAGNOSIS — Z130 Encounter for screening for diseases of the blood and blood-forming organs and certain disorders involving the immune mechanism: Secondary | ICD-10-CM | POA: Diagnosis not present

## 2015-05-20 DIAGNOSIS — F32A Depression, unspecified: Secondary | ICD-10-CM

## 2015-05-20 DIAGNOSIS — E785 Hyperlipidemia, unspecified: Secondary | ICD-10-CM | POA: Diagnosis not present

## 2015-05-20 DIAGNOSIS — R739 Hyperglycemia, unspecified: Secondary | ICD-10-CM | POA: Diagnosis not present

## 2015-05-20 DIAGNOSIS — F329 Major depressive disorder, single episode, unspecified: Secondary | ICD-10-CM

## 2015-05-20 DIAGNOSIS — Z23 Encounter for immunization: Secondary | ICD-10-CM

## 2015-05-20 DIAGNOSIS — R972 Elevated prostate specific antigen [PSA]: Secondary | ICD-10-CM

## 2015-05-20 DIAGNOSIS — R0683 Snoring: Secondary | ICD-10-CM | POA: Diagnosis not present

## 2015-05-20 DIAGNOSIS — Z0000 Encounter for general adult medical examination without abnormal findings: Secondary | ICD-10-CM

## 2015-05-20 DIAGNOSIS — I2583 Coronary atherosclerosis due to lipid rich plaque: Secondary | ICD-10-CM

## 2015-05-20 LAB — CBC
HCT: 46.4 % (ref 39.0–52.0)
Hemoglobin: 16 g/dL (ref 13.0–17.0)
MCH: 31.1 pg (ref 26.0–34.0)
MCHC: 34.5 g/dL (ref 30.0–36.0)
MCV: 90.3 fL (ref 78.0–100.0)
MPV: 9.9 fL (ref 8.6–12.4)
Platelets: 190 10*3/uL (ref 150–400)
RBC: 5.14 MIL/uL (ref 4.22–5.81)
RDW: 13.3 % (ref 11.5–15.5)
WBC: 8.9 10*3/uL (ref 4.0–10.5)

## 2015-05-20 LAB — COMPREHENSIVE METABOLIC PANEL
ALT: 41 U/L (ref 9–46)
AST: 34 U/L (ref 10–35)
Albumin: 4.2 g/dL (ref 3.6–5.1)
Alkaline Phosphatase: 68 U/L (ref 40–115)
BUN: 17 mg/dL (ref 7–25)
CO2: 24 mmol/L (ref 20–31)
Calcium: 9 mg/dL (ref 8.6–10.3)
Chloride: 105 mmol/L (ref 98–110)
Creat: 1.03 mg/dL (ref 0.70–1.18)
Glucose, Bld: 92 mg/dL (ref 65–99)
Potassium: 3.8 mmol/L (ref 3.5–5.3)
Sodium: 141 mmol/L (ref 135–146)
Total Bilirubin: 0.7 mg/dL (ref 0.2–1.2)
Total Protein: 7.2 g/dL (ref 6.1–8.1)

## 2015-05-20 LAB — LIPID PANEL
Cholesterol: 132 mg/dL (ref 125–200)
HDL: 37 mg/dL — ABNORMAL LOW (ref >=40)
LDL Cholesterol: 68 mg/dL (ref <130)
Total CHOL/HDL Ratio: 3.6 Ratio (ref <=5.0)
Triglycerides: 136 mg/dL (ref <150)
VLDL: 27 mg/dL (ref <30)

## 2015-05-20 MED ORDER — NITROGLYCERIN 0.4 MG SL SUBL: 0.4000 mg | SUBLINGUAL_TABLET | SUBLINGUAL | Status: DC | PRN

## 2015-05-20 MED ORDER — ATORVASTATIN CALCIUM 20 MG PO TABS: 20.0000 mg | ORAL_TABLET | Freq: Every day | ORAL | Status: DC

## 2015-05-20 MED ORDER — CLOPIDOGREL BISULFATE 75 MG PO TABS: ORAL_TABLET | ORAL | Status: DC

## 2015-05-20 MED ORDER — FLUOXETINE HCL 20 MG PO TABS: 20.0000 mg | ORAL_TABLET | Freq: Every day | ORAL | Status: DC

## 2015-05-20 NOTE — Progress Notes (Addendum)
[facility] [address] [place] [address] [phone]  Date:  05/20/2015   Name:  Shawn Meza   DOB:  03-16-1946   MRN:  [id number]  PCP:  Robyn Haber, MD    Chief Complaint: Annual Exam   History of Present Illness:  Shawn Meza is a 69 y.o. very pleasant male patient who presents with the following:  Here today for a CPE.  History of CAD and TIA.  He had a cardiac stent placed in 2013.  The follow-up plan was to treat his risk factors aggressively.  He has not see cardiology in a few years.   Most recent labs about a year ago.   He does have a hiatal hernia and occasionally has some sx from this.  He does not use nitroglycerin as he is out.   He is fasting today for labs  He is UTD on tetanus shot and had pneumovax in 2011. He does need a prevnar today He also needs a flu shot today  He did have a TURP procedure for enlarged prostate (not cancer) in 2013- he has not followed up on this with his urologist (who was in [place]) in some time.   He had a colon cancer that we treated surgically in 2013- he does not see GI any longer, he was seeing Dr. Benson Norway  Pt is a very poor historian and is quite confused about his prior history.  He seems disgruntled with most of his prior specialists because they "didn't tell me what was going on." With further interview it seems clear that he has some dementia.  He also complains that his memory is poor and that he "can't remember anything."   Patient Active Problem List   Diagnosis Date Noted  . Medial meniscus tear 10/11/2011  . Neuroma of foot 10/11/2011  . CAD (coronary artery disease) 12/28/2010  . 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 HYPERLIPIDEMIA 05/20/2010  . HYPERTENSION 05/20/2010  . 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 GERD 05/20/2010  . TRANSIENT ISCHEMIC ATTACK, HX OF 05/20/2010  . NEPHROLITHIASIS, HX OF 05/20/2010  . BENIGN PROSTATIC HYPERTROPHY, HX OF, S/P TURP  05/20/2010    Past Medical History  Diagnosis Date  . GERD (gastroesophageal reflux disease)   . Headache(784.0)   . Hyperlipidemia   . Hypertension   . Nephrolithiasis     hx of  . Transient ischemic attack     hx of  . BPH (benign prostatic hypertrophy)   . CAD (coronary artery disease)   . Colon cancer ([place])   . Medial meniscus tear 10/11/2011  . Arthritis     Past Surgical History  Procedure Laterality Date  . Transurethral resection of prostate    . Cardiac catheterization  5/12,1/13  . Knee arthroscopy  10/11/2011    Procedure: ARTHROSCOPY KNEE;  Surgeon: Lorn Junes, MD;  Location: [address];  Service: Orthopedics;  Laterality: Left;  Left Knee Arthroscopy with Medial and Lateral Partial Menisectomy, Chondroplasty  . Steriod injection  10/11/2011    Procedure: STEROID INJECTION;  Surgeon: Lorn Junes, MD;  Location: [place];  Service: Orthopedics;  Laterality: Right;  Steroid Injection Second Toe  . Colon surgery    . Left heart catheterization with coronary angiogram N/A 08/25/2011    Procedure: LEFT HEART CATHETERIZATION WITH CORONARY ANGIOGRAM;  Surgeon: Burnell Blanks, MD;  Location: [facility] CATH LAB;  Service: Cardiovascular;  Laterality: N/A;  Social History  Substance Use Topics  . Smoking status: Never Smoker   . Smokeless tobacco: None  . Alcohol Use: No    Family History  Problem Relation Age of Onset  . Coronary artery disease Other     family hx of male 1st degree relative ,43  . Hyperlipidemia      family hx of  . Hypertension      family hx of  . Arthritis      family hx of  . Heart disease Mother   . Heart disease Father   . Hyperlipidemia Father   . Stroke Father   . Hyperlipidemia Brother   . Heart disease Brother     Allergies  Allergen Reactions  . Testosterone     REACTION: abd pain    Medication list has been reviewed and updated.  Current Outpatient Prescriptions on File Prior to  Visit  Medication Sig Dispense Refill  . aspirin 81 MG tablet Take 81 mg by mouth daily.      Marland Kitchen atorvastatin (LIPITOR) 20 MG tablet Take 1 tablet (20 mg total) by mouth daily. 90 tablet 3  . clopidogrel (PLAVIX) 75 MG tablet TAKE 1 TABLET (75 MG TOTAL) BY MOUTH ONCE DAILY. 30 tablet 1  . esomeprazole (NEXIUM) 40 MG capsule Take 1 capsule (40 mg total) by mouth daily before breakfast. 90 capsule 3  . fluocinonide-emollient (LIDEX-E) 0.05 % cream Apply 1 application topically 2 (two) times daily. 60 g 2  . FLUoxetine (PROZAC) 20 MG tablet Take 1 tablet (20 mg total) by mouth daily. 90 tablet 2  . HYDROcodone-acetaminophen (NORCO) 5-325 MG tablet Take 1 tablet by mouth every 6 (six) hours as needed. 30 tablet 0  . meloxicam (MOBIC) 7.5 MG tablet Take one twice daily for 5 days, then once daily 20 tablet 0  . methocarbamol (ROBAXIN) 500 MG tablet One three times daily with meals and 2 at bedtime as needed for muscle relaxant 30 tablet 0  . metoprolol tartrate (LOPRESSOR) 25 MG tablet TAKE 1/2 TABLET BY MOUTH TWICE DAILY 180 tablet 0  . nitroGLYCERIN (NITROSTAT) 0.4 MG SL tablet Place 1 tablet (0.4 mg total) under the tongue every 5 (five) minutes as needed. Chest pain 100 tablet 0  . predniSONE (DELTASONE) 20 MG tablet Take 1 tablet (20 mg total) by mouth daily with breakfast. 10 tablet 1  . rosuvastatin (CRESTOR) 40 MG tablet Take 1 tablet (40 mg total) by mouth daily. 90 tablet 3  . tobramycin (TOBREX) 0.3 % ophthalmic solution Place 1 drop into both eyes every 4 (four) hours. 5 mL 0   No current facility-administered medications on file prior to visit.    Review of Systems:  As per HPI- otherwise negative.   Physical Examination: Filed Vitals:   05/20/15 1342  BP: 120/72  Pulse: 82  Temp: 98.1 F (36.7 C)  Resp: 16   Filed Vitals:   05/20/15 1342  Height: 5\' 5"  (1.651 m)  Weight: 223 lb 3.2 oz (101.243 kg)   Body mass index is 37.14 kg/(m^2). Ideal Body Weight: Weight in (lb)  to have BMI = 25: 149.9  GEN: WDWN, NAD, Non-toxic, A & O x 3, obese, has a large neck and likely has OSA HEENT: Atraumatic, Normocephalic. Neck supple. No masses, No LAD. Ears and Nose: No external deformity. CV: RRR, No M/G/R. No JVD. No thrill. No extra heart sounds. PULM: CTA B, no wheezes, crackles, rhonchi. No retractions. No resp. distress. No accessory muscle use. ABD:  S, NT, ND, +BS. No rebound. No HSM.  Umbilical hernia EXTR: No c/c/e NEURO Normal gait.  PSYCH: Normally interactive. Conversant. Not depressed or anxious appearing.  Calm demeanor.   Assessment and Plan: Physical exam  Coronary artery disease due to calcified coronary lesion - Plan: Lipid panel  Screening for deficiency anemia - Plan: CBC  Screening for diabetes mellitus - Plan: Comprehensive metabolic panel, Hemoglobin A1c  Screening for prostate cancer - Plan: PSA, Medicare  Immunization due - Plan: Flu Vaccine QUAD 36+ mos IM, Pneumococcal conjugate vaccine 13-valent IM  Snoring - Plan: Ambulatory referral to Sleep Studies  History of colon cancer - Plan: Ambulatory referral to Gastroenterology  Here today to see me as a new patient.  He has not followed-up with cardiology or with GI or urology in some years.  It seems that he has become frustrated with several of his specialists in the past- suspect that he was beginning to become demented and this is why he felt that his doctors were not communicating well.  In any case, he needs to see GI as he had a colon cancer a few years ago- he is ok with this plan.  Also referred for a sleep study as he almost certainly has significant OSA. Will plan to re-refer to cardiology some time soon Await labs  Plan to recheck in 1-2 months to follow-up and further discuss his memory.  Plan a MMSE at that time and consider starting medication for dementia.  Signed Lamar Blinks, MD  10/20- received his labs and called him.  I am concerned about significant rise in his  PSA.  This needs eval as he could have cancer.  [facility] will [facility] to [facility]- he states understanding.  His A1c also shows pre-diabetes. He will work on losing some weight and will see me in 1-2 months for a recheck.  Asked him to being his wife to help [place] discuss his memory  Results for orders placed or performed in visit on 05/20/15  CBC  Result Value Ref Range   WBC 8.9 4.0 - 10.5 K/uL   RBC 5.14 4.22 - 5.81 MIL/uL   Hemoglobin 16.0 13.0 - 17.0 g/dL   HCT 46.4 39.0 - 52.0 %   MCV 90.3 78.0 - 100.0 fL   MCH 31.1 26.0 - 34.0 pg   MCHC 34.5 30.0 - 36.0 g/dL   RDW 13.3 11.5 - 15.5 %   Platelets 190 150 - 400 K/uL   MPV 9.9 8.6 - 12.4 fL  Comprehensive metabolic panel  Result Value Ref Range   Sodium 141 135 - 146 mmol/L   Potassium 3.8 3.5 - 5.3 mmol/L   Chloride 105 98 - 110 mmol/L   CO2 24 20 - 31 mmol/L   Glucose, Bld 92 65 - 99 mg/dL   BUN 17 7 - 25 mg/dL   Creat 1.03 0.70 - 1.18 mg/dL   Total Bilirubin 0.7 0.2 - 1.2 mg/dL   Alkaline Phosphatase 68 40 - 115 U/L   AST 34 10 - 35 U/L   ALT 41 9 - 46 U/L   Total Protein 7.2 6.1 - 8.1 g/dL   Albumin 4.2 3.6 - 5.1 g/dL   Calcium 9.0 8.6 - 10.3 mg/dL  Lipid panel  Result Value Ref Range   Cholesterol 132 125 - 200 mg/dL   Triglycerides 136 <150 mg/dL   HDL 37 (L) >=40 mg/dL   Total CHOL/HDL Ratio 3.6 <=5.0 Ratio   VLDL 27 <30 mg/dL  LDL Cholesterol 68 <130 mg/dL  Hemoglobin A1c  Result Value Ref Range   Hgb A1c MFr Bld 6.1 (H) <5.7 %   Mean Plasma Glucose 128 (H) <117 mg/dL  PSA, Medicare  Result Value Ref Range   PSA 6.87 (H) <=4.00 ng/mL

## 2015-05-20 NOTE — Patient Instructions (Addendum)
It was good to see you today- I will be in touch with your labs asap I will refer you back to gastroenterology to follow-up on your history of colon cancer.  It seems that you are a patient of Dr. Benson Norway so I will arrange this for you.   I suspect that you have sleep apnea- we will arrange a sleep study test for this.   Please come and see me in 1-2 months for a recheck and to discus your memory

## 2015-05-21 ENCOUNTER — Encounter: Payer: Self-pay | Admitting: Family Medicine

## 2015-05-21 ENCOUNTER — Telehealth: Payer: Self-pay

## 2015-05-21 LAB — HEMOGLOBIN A1C
Hgb A1c MFr Bld: 6.1 % — ABNORMAL HIGH (ref <5.7)
Mean Plasma Glucose: 128 mg/dL — ABNORMAL HIGH (ref <117)

## 2015-05-21 LAB — PSA, MEDICARE: PSA: 6.87 ng/mL — ABNORMAL HIGH (ref <=4.00)

## 2015-05-21 NOTE — Telephone Encounter (Signed)
We received a call from [facility] who contacted the patient to schedule a consultation for a sleep study.  He told them that he didn't need anything like that, and he hung up.  It would be beneficial for the patient if a clinical staff member or Dr Lorelei Pont, the referring provider, could explain the medical necessity/benefit of a sleep study given his dx, medical history, etc.  Vaughan Basta at [facility] said the patient was "not prepared" for the referral and may have been confused as to why it was important for him to be seen there.  CB#: [phone]

## 2015-05-21 NOTE — Addendum Note (Signed)
Addended by: Lamar Blinks C on: 05/21/2015 09:17 AM   Modules accepted: Orders

## 2015-05-22 NOTE — Telephone Encounter (Signed)
I called pt and talked with him- he had thought that when [facility] called him it was an advertisement of some sort.  He would like to go ahead with sleep study- called [facility] and let them know, they will call him back.

## 2015-05-26 ENCOUNTER — Institutional Professional Consult (permissible substitution): Payer: Medicare Other | Admitting: Neurology

## 2015-05-27 ENCOUNTER — Telehealth: Payer: Self-pay

## 2015-05-27 NOTE — Telephone Encounter (Signed)
Pt wants Dr.Copland to call give him a call. He wants to speak to her about his brain scan.  Please advise  [phone]

## 2015-05-28 NOTE — Telephone Encounter (Signed)
Dr Copland? 

## 2015-05-29 ENCOUNTER — Telehealth: Payer: Self-pay

## 2015-05-29 DIAGNOSIS — G44319 Acute post-traumatic headache, not intractable: Secondary | ICD-10-CM

## 2015-05-29 NOTE — Telephone Encounter (Signed)
Called and [facility] sorry to just call back now, I had not seen this message until yesterday. The most recent brain scans I can see are from 2011- is this the scan he is referring to?  If there is something more recent I am glad to go over it with him but I cannot see it on my system.  Please let me know if I can be of further help

## 2015-05-29 NOTE — Telephone Encounter (Signed)
Please reference call from 10/26. Pt is very sorry, he missed your call Dr. Lorelei Pont. I read it to him. He needs to still talk with you. CB# [phone]

## 2015-05-29 NOTE — Telephone Encounter (Signed)
Called pt back- his doctor at [facility] has suggested that he get a brain scan?  Pt is not sure what to make of this.  I will call and try to find out what is going on on Monday

## 2015-06-02 NOTE — Telephone Encounter (Signed)
Called his Chiropractic office and spoke with assistant who reviewed the chart- it seems that Shawn Meza had complained of severe HA and of not being able to see out of one eye, so they had recommended that he see his PCP.  Called him back to discuss; he is not able to give me a very clear description of what he has noticed, but complains that he has a sore area in the right forehead, that his ears feel tight and that his vision is "in a daze."   He would like to proceed with a CT scan.  I will arrange this for him

## 2015-06-03 NOTE — Telephone Encounter (Signed)
Called to check on scan- this was ordered yesterday I thought as a stat, but did not end up being stat.  [facility] does not have schedulers in this late.  Called pt- I will handle this first think tomorrow

## 2015-06-04 ENCOUNTER — Ambulatory Visit
Admission: RE | Admit: 2015-06-04 | Discharge: 2015-06-04 | Disposition: A | Discharge status: Home / Self Care | Payer: Medicare Other | Source: Ambulatory Visit | Attending: Family Medicine | Admitting: Family Medicine

## 2015-06-04 DIAGNOSIS — R51 Headache: Secondary | ICD-10-CM | POA: Diagnosis not present

## 2015-06-04 DIAGNOSIS — S0990XA Unspecified injury of head, initial encounter: Secondary | ICD-10-CM | POA: Diagnosis not present

## 2015-06-04 DIAGNOSIS — G44319 Acute post-traumatic headache, not intractable: Secondary | ICD-10-CM

## 2015-06-04 NOTE — Telephone Encounter (Addendum)
Spoke with [place]. Told me to have pt walk in today before noon or after 130. Pt notified.

## 2015-06-15 ENCOUNTER — Institutional Professional Consult (permissible substitution): Payer: Medicare Other | Admitting: Neurology

## 2015-06-17 ENCOUNTER — Ambulatory Visit (INDEPENDENT_AMBULATORY_CARE_PROVIDER_SITE_OTHER): Payer: Medicare Other | Admitting: Family Medicine

## 2015-06-17 ENCOUNTER — Encounter: Payer: Self-pay | Admitting: Family Medicine

## 2015-06-17 VITALS — BP 133/83 | HR 59 | Temp 97.7°F | Resp 18 | Ht 65.0 in | Wt 222.0 lb

## 2015-06-17 DIAGNOSIS — G44319 Acute post-traumatic headache, not intractable: Secondary | ICD-10-CM | POA: Diagnosis not present

## 2015-06-17 DIAGNOSIS — M545 Low back pain, unspecified: Secondary | ICD-10-CM

## 2015-06-17 DIAGNOSIS — M542 Cervicalgia: Secondary | ICD-10-CM | POA: Diagnosis not present

## 2015-06-17 DIAGNOSIS — R7303 Prediabetes: Secondary | ICD-10-CM | POA: Diagnosis not present

## 2015-06-17 DIAGNOSIS — M479 Spondylosis, unspecified: Secondary | ICD-10-CM | POA: Diagnosis not present

## 2015-06-17 DIAGNOSIS — M546 Pain in thoracic spine: Secondary | ICD-10-CM

## 2015-06-17 DIAGNOSIS — R413 Other amnesia: Secondary | ICD-10-CM | POA: Diagnosis not present

## 2015-06-17 DIAGNOSIS — M549 Dorsalgia, unspecified: Secondary | ICD-10-CM

## 2015-06-17 MED ORDER — MELOXICAM 7.5 MG PO TABS: ORAL_TABLET | ORAL | Status: DC
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
## 2015-06-17 NOTE — Patient Instructions (Signed)
I will refer you to neurology to help [place] see if there is any medication that will help with your memory If you have any other concerns in the meantime let me know!   Please come and see me in about 4 months to check on your blood sugar readings.

## 2015-07-06 ENCOUNTER — Ambulatory Visit (INDEPENDENT_AMBULATORY_CARE_PROVIDER_SITE_OTHER): Payer: Medicare Other | Admitting: Neurology

## 2015-07-06 ENCOUNTER — Encounter: Payer: Self-pay | Admitting: Neurology

## 2015-07-06 VITALS — BP 163/86 | HR 64 | Ht 65.0 in | Wt 225.0 lb

## 2015-07-06 DIAGNOSIS — F329 Major depressive disorder, single episode, unspecified: Secondary | ICD-10-CM | POA: Diagnosis not present

## 2015-07-06 DIAGNOSIS — R413 Other amnesia: Secondary | ICD-10-CM

## 2015-07-06 DIAGNOSIS — F339 Major depressive disorder, recurrent, unspecified: Secondary | ICD-10-CM | POA: Insufficient documentation

## 2015-07-06 DIAGNOSIS — F6811 Factitious disorder with predominantly psychological signs and symptoms: Secondary | ICD-10-CM

## 2015-07-06 DIAGNOSIS — E538 Deficiency of other specified B group vitamins: Secondary | ICD-10-CM | POA: Diagnosis not present

## 2015-07-06 DIAGNOSIS — F32A Depression, unspecified: Secondary | ICD-10-CM

## 2015-07-06 DIAGNOSIS — R4189 Other symptoms and signs involving cognitive functions and awareness: Secondary | ICD-10-CM

## 2015-07-06 HISTORY — DX: Major depressive disorder, recurrent, unspecified: F33.9

## 2015-07-06 HISTORY — DX: Other amnesia: R41.3

## 2015-07-06 MED ORDER — DONEPEZIL HCL 10 MG PO TABS: 10.0000 mg | ORAL_TABLET | Freq: Every day | ORAL | Status: DC
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
## 2015-07-06 NOTE — Patient Instructions (Addendum)
Remember to drink plenty of fluid, eat healthy meals and do not skip any meals. Try to eat protein with a every meal and eat a healthy snack such as fruit or nuts in between meals. Try to keep a regular sleep-wake schedule and try to exercise daily, particularly in the form of walking, 20-30 minutes a day, if you can.   Medications: Aricept start with 5mg , increase to 10ng in 2 weeks  As far as diagnostic testing: Neuropsychological testing, labs  I would like to see you back after neuropsychological testing, sooner if we need to. Please call us with any interim questions, concerns, problems, updates or refill requests.   Our phone number is [phone]. We also have an after hours call service for urgent matters and there is a physician on-call for urgent questions. For any emergencies you know to call 911 or go to the nearest emergency room

## 2015-07-09 ENCOUNTER — Telehealth: Payer: Self-pay | Admitting: *Deleted

## 2015-07-09 LAB — THYROID PANEL WITH TSH
Free Thyroxine Index: 1.7 (ref 1.2–4.9)
T3 Uptake Ratio: 25 % (ref 24–39)
T4, Total: 6.9 ug/dL (ref 4.5–12.0)
TSH: 1.33 u[IU]/mL (ref 0.450–4.500)

## 2015-07-09 LAB — METHYLMALONIC ACID, SERUM: Methylmalonic Acid: 142 nmol/L (ref 0–378)

## 2015-07-09 LAB — B12 AND FOLATE PANEL
Folate: 15.9 ng/mL (ref >3.0)
Vitamin B-12: 607 pg/mL (ref 211–946)

## 2015-07-09 NOTE — Telephone Encounter (Signed)
Called and spoke to pt about normal labs per Dr Jaynee Eagles. He verbalized understanding. He also said he started rx aricept and is taking it at night. He is not feeling very good. Feels like he is dehydrated. He said he drank 2 cups coffee and water yesterday morning felt like he was going to pass out. He is still feeling a little "off" today. Went over potential side effects of medication w/ him. Advised I will speak to Dr Jaynee Eagles and call him back. He verbalized understanding.

## 2015-07-09 NOTE — Telephone Encounter (Signed)
-----   Message from Melvenia Beam, MD sent at 07/09/2015 12:21 PM EST ----- Labs all normal thanks!

## 2015-07-14 NOTE — Telephone Encounter (Signed)
Called and LVM for pt to call back. Gave [facility] phone number and hours.

## 2015-07-14 NOTE — Telephone Encounter (Signed)
I would follow up with primary care if symptoms continue. Stop it and see if he improves. We can always try it again or a different medication.

## 2015-07-14 NOTE — Telephone Encounter (Signed)
Called and spoke to pt relaying Dr Jaynee Eagles message. He verbalized understanding and he is going to see his PCP tomorrow. He stopped Aricept and said sx got better. He is going to talk to PCP first and let us know what she says.

## 2015-07-15 ENCOUNTER — Telehealth: Payer: Self-pay | Admitting: Internal Medicine

## 2015-07-15 ENCOUNTER — Encounter: Payer: Self-pay | Admitting: Family Medicine

## 2015-07-15 ENCOUNTER — Ambulatory Visit (INDEPENDENT_AMBULATORY_CARE_PROVIDER_SITE_OTHER): Payer: Medicare Other | Admitting: Family Medicine

## 2015-07-15 VITALS — BP 119/71 | HR 58 | Temp 98.4°F | Resp 16 | Ht 65.5 in | Wt 222.2 lb

## 2015-07-15 DIAGNOSIS — F320 Major depressive disorder, single episode, mild: Secondary | ICD-10-CM

## 2015-07-15 DIAGNOSIS — G3184 Mild cognitive impairment, so stated: Secondary | ICD-10-CM

## 2015-07-15 NOTE — Patient Instructions (Signed)
You do not have to take the aricept (memory pill) if it bothers you.  If you like you can try it once more and see if   Go back on the prozac to see if it will help with your depression- remember this does take a few weeks to start helping If you are in danger of hurting yourself please seek help!

## 2015-07-15 NOTE — Telephone Encounter (Signed)
Dr. Hilarie Fredrickson reviewed records and has accepted patient. Ok to schedule Direct Colonoscopy. Patient states that he will call back after the first of the year to schedule.
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
## 2015-07-30 ENCOUNTER — Other Ambulatory Visit: Payer: Self-pay | Admitting: Physician Assistant

## 2015-08-17 ENCOUNTER — Telehealth: Payer: Self-pay | Admitting: Neurology
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
## 2015-08-17 NOTE — Telephone Encounter (Signed)
LVM returning call from pt. Gave [facility] phone number and hours.
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
## 2015-08-21 ENCOUNTER — Encounter: Payer: Self-pay | Admitting: Family Medicine

## 2015-08-26 ENCOUNTER — Encounter: Payer: Self-pay | Admitting: Family Medicine

## 2015-09-09 ENCOUNTER — Telehealth: Payer: Self-pay

## 2015-09-09 NOTE — Telephone Encounter (Signed)
Waiting on payment of $40.25 for 69 pages from automated record collections.

## 2015-09-16 NOTE — Telephone Encounter (Signed)
Payment received and records faxed over on 09/16/15

## 2015-09-18 DIAGNOSIS — Z0271 Encounter for disability determination: Secondary | ICD-10-CM

## 2015-09-24 ENCOUNTER — Ambulatory Visit: Payer: Medicare Other | Attending: Neurology | Admitting: Psychology

## 2015-09-24 DIAGNOSIS — F329 Major depressive disorder, single episode, unspecified: Secondary | ICD-10-CM | POA: Insufficient documentation

## 2015-09-24 DIAGNOSIS — F411 Generalized anxiety disorder: Secondary | ICD-10-CM | POA: Insufficient documentation

## 2015-09-24 DIAGNOSIS — F32A Depression, unspecified: Secondary | ICD-10-CM

## 2015-09-24 DIAGNOSIS — R413 Other amnesia: Secondary | ICD-10-CM

## 2015-09-25 NOTE — Progress Notes (Signed)
[facility]  [address]   Telephone [phone] [address] Fax [phone] [place], [place] [address]  Initial Contact Note  Name:  Shawn Meza Date of Birth; [birth year] MRN:  [id number] Date:  09/25/2015  Shawn Meza is an 70 y.o. male who was referred for neuropsychological evaluation by Sarina Ill, MD due to problems with memory.   A total of 6 hours was spent today reviewing medical records, interviewing (CPT [id number]) [name] and administering and scoring neurocognitive tests (CPT [id number] & [phone]).  Preliminary Diagnostic Impression: Memory loss [R41.3] Depression [F32.9]  There were no concerns expressed or behaviors displayed by [name] that would require immediate attention.   A full report will follow once the planned testing has been completed. His next appointment is scheduled for 09/29/15.Jamey Ripa, Ph.D Licensed Psychologist 09/25/2015

## 2015-09-29 ENCOUNTER — Ambulatory Visit (INDEPENDENT_AMBULATORY_CARE_PROVIDER_SITE_OTHER): Payer: Medicare Other | Admitting: Psychology

## 2015-09-29 DIAGNOSIS — F329 Major depressive disorder, single episode, unspecified: Secondary | ICD-10-CM

## 2015-09-29 DIAGNOSIS — F411 Generalized anxiety disorder: Secondary | ICD-10-CM

## 2015-09-29 DIAGNOSIS — F32A Depression, unspecified: Secondary | ICD-10-CM

## 2015-09-29 DIAGNOSIS — R413 Other amnesia: Secondary | ICD-10-CM

## 2015-10-01 ENCOUNTER — Encounter: Payer: Self-pay | Admitting: Psychology

## 2015-10-01 DIAGNOSIS — Z79899 Other long term (current) drug therapy: Secondary | ICD-10-CM | POA: Diagnosis not present

## 2015-10-01 DIAGNOSIS — E7800 Pure hypercholesterolemia, unspecified: Secondary | ICD-10-CM | POA: Diagnosis not present

## 2015-10-01 DIAGNOSIS — R0602 Shortness of breath: Secondary | ICD-10-CM | POA: Diagnosis not present

## 2015-10-01 DIAGNOSIS — K219 Gastro-esophageal reflux disease without esophagitis: Secondary | ICD-10-CM | POA: Diagnosis not present

## 2015-10-01 DIAGNOSIS — I119 Hypertensive heart disease without heart failure: Secondary | ICD-10-CM | POA: Diagnosis not present

## 2015-10-01 NOTE — Telephone Encounter (Signed)
I have attempted to call patient several times. Patient states that he will call back to schedule. Records will be in "records reviewed" folder.
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
## 2015-10-15 DIAGNOSIS — N529 Male erectile dysfunction, unspecified: Secondary | ICD-10-CM | POA: Diagnosis not present

## 2015-10-15 DIAGNOSIS — Z125 Encounter for screening for malignant neoplasm of prostate: Secondary | ICD-10-CM | POA: Diagnosis not present

## 2015-10-27 ENCOUNTER — Other Ambulatory Visit: Payer: Self-pay | Admitting: Family Medicine

## 2015-10-27 DIAGNOSIS — I10 Essential (primary) hypertension: Secondary | ICD-10-CM | POA: Diagnosis not present

## 2015-10-27 DIAGNOSIS — I25119 Atherosclerotic heart disease of native coronary artery with unspecified angina pectoris: Secondary | ICD-10-CM | POA: Diagnosis not present

## 2015-10-27 DIAGNOSIS — I2510 Atherosclerotic heart disease of native coronary artery without angina pectoris: Secondary | ICD-10-CM | POA: Diagnosis not present

## 2015-10-28 DIAGNOSIS — I25119 Atherosclerotic heart disease of native coronary artery with unspecified angina pectoris: Secondary | ICD-10-CM | POA: Diagnosis not present

## 2015-10-28 DIAGNOSIS — R079 Chest pain, unspecified: Secondary | ICD-10-CM | POA: Diagnosis not present

## 2015-10-28 DIAGNOSIS — I10 Essential (primary) hypertension: Secondary | ICD-10-CM | POA: Diagnosis not present

## 2015-10-28 DIAGNOSIS — Z79899 Other long term (current) drug therapy: Secondary | ICD-10-CM | POA: Diagnosis not present

## 2015-10-28 DIAGNOSIS — K449 Diaphragmatic hernia without obstruction or gangrene: Secondary | ICD-10-CM | POA: Diagnosis not present

## 2015-10-28 DIAGNOSIS — R42 Dizziness and giddiness: Secondary | ICD-10-CM | POA: Diagnosis not present

## 2015-10-28 DIAGNOSIS — Z7982 Long term (current) use of aspirin: Secondary | ICD-10-CM | POA: Diagnosis not present

## 2015-10-28 DIAGNOSIS — R069 Unspecified abnormalities of breathing: Secondary | ICD-10-CM | POA: Diagnosis not present

## 2015-10-28 DIAGNOSIS — R0789 Other chest pain: Secondary | ICD-10-CM | POA: Diagnosis not present

## 2015-10-28 DIAGNOSIS — Z85038 Personal history of other malignant neoplasm of large intestine: Secondary | ICD-10-CM | POA: Diagnosis not present

## 2015-10-28 DIAGNOSIS — F418 Other specified anxiety disorders: Secondary | ICD-10-CM | POA: Diagnosis not present

## 2015-10-28 DIAGNOSIS — E785 Hyperlipidemia, unspecified: Secondary | ICD-10-CM | POA: Diagnosis not present

## 2015-10-28 DIAGNOSIS — J8410 Pulmonary fibrosis, unspecified: Secondary | ICD-10-CM | POA: Diagnosis not present

## 2015-10-28 DIAGNOSIS — R110 Nausea: Secondary | ICD-10-CM | POA: Diagnosis not present

## 2015-10-28 DIAGNOSIS — R0602 Shortness of breath: Secondary | ICD-10-CM | POA: Diagnosis not present

## 2015-10-28 DIAGNOSIS — F329 Major depressive disorder, single episode, unspecified: Secondary | ICD-10-CM | POA: Diagnosis not present

## 2015-10-28 DIAGNOSIS — Z9889 Other specified postprocedural states: Secondary | ICD-10-CM | POA: Diagnosis not present

## 2015-10-29 DIAGNOSIS — I10 Essential (primary) hypertension: Secondary | ICD-10-CM | POA: Diagnosis not present

## 2015-10-29 DIAGNOSIS — I2510 Atherosclerotic heart disease of native coronary artery without angina pectoris: Secondary | ICD-10-CM | POA: Diagnosis not present

## 2015-10-29 DIAGNOSIS — R079 Chest pain, unspecified: Secondary | ICD-10-CM | POA: Diagnosis not present

## 2015-11-05 DIAGNOSIS — Z9861 Coronary angioplasty status: Secondary | ICD-10-CM | POA: Diagnosis not present

## 2015-11-05 DIAGNOSIS — I119 Hypertensive heart disease without heart failure: Secondary | ICD-10-CM | POA: Diagnosis not present

## 2015-11-05 DIAGNOSIS — K219 Gastro-esophageal reflux disease without esophagitis: Secondary | ICD-10-CM | POA: Diagnosis not present

## 2015-11-05 DIAGNOSIS — I2510 Atherosclerotic heart disease of native coronary artery without angina pectoris: Secondary | ICD-10-CM | POA: Diagnosis not present

## 2015-11-12 DIAGNOSIS — K219 Gastro-esophageal reflux disease without esophagitis: Secondary | ICD-10-CM | POA: Diagnosis not present

## 2015-11-12 DIAGNOSIS — E8881 Metabolic syndrome: Secondary | ICD-10-CM | POA: Diagnosis not present

## 2015-11-12 DIAGNOSIS — F329 Major depressive disorder, single episode, unspecified: Secondary | ICD-10-CM | POA: Diagnosis not present

## 2015-11-12 DIAGNOSIS — Z0000 Encounter for general adult medical examination without abnormal findings: Secondary | ICD-10-CM | POA: Diagnosis not present

## 2015-11-12 DIAGNOSIS — E7800 Pure hypercholesterolemia, unspecified: Secondary | ICD-10-CM | POA: Diagnosis not present

## 2015-11-16 DIAGNOSIS — R972 Elevated prostate specific antigen [PSA]: Secondary | ICD-10-CM | POA: Diagnosis not present

## 2015-11-16 DIAGNOSIS — N401 Enlarged prostate with lower urinary tract symptoms: Secondary | ICD-10-CM | POA: Diagnosis not present

## 2015-11-16 DIAGNOSIS — I2510 Atherosclerotic heart disease of native coronary artery without angina pectoris: Secondary | ICD-10-CM | POA: Diagnosis not present

## 2015-11-16 DIAGNOSIS — I25119 Atherosclerotic heart disease of native coronary artery with unspecified angina pectoris: Secondary | ICD-10-CM | POA: Diagnosis not present

## 2015-11-16 DIAGNOSIS — I10 Essential (primary) hypertension: Secondary | ICD-10-CM | POA: Diagnosis not present

## 2015-12-05 ENCOUNTER — Emergency Department (HOSPITAL_COMMUNITY)
Admission: EM | Admit: 2015-12-05 | Discharge: 2015-12-05 | Disposition: A | Discharge status: Home / Self Care | Payer: Medicare Other | Attending: Emergency Medicine | Admitting: Emergency Medicine

## 2015-12-05 ENCOUNTER — Emergency Department (HOSPITAL_COMMUNITY): Payer: Medicare Other

## 2015-12-05 DIAGNOSIS — S0010XA Contusion of unspecified eyelid and periocular area, initial encounter: Secondary | ICD-10-CM | POA: Diagnosis not present

## 2015-12-05 DIAGNOSIS — Z8673 Personal history of transient ischemic attack (TIA), and cerebral infarction without residual deficits: Secondary | ICD-10-CM | POA: Diagnosis not present

## 2015-12-05 DIAGNOSIS — Y998 Other external cause status: Secondary | ICD-10-CM | POA: Diagnosis not present

## 2015-12-05 DIAGNOSIS — H11431 Conjunctival hyperemia, right eye: Secondary | ICD-10-CM | POA: Diagnosis not present

## 2015-12-05 DIAGNOSIS — Z85038 Personal history of other malignant neoplasm of large intestine: Secondary | ICD-10-CM | POA: Insufficient documentation

## 2015-12-05 DIAGNOSIS — R220 Localized swelling, mass and lump, head: Secondary | ICD-10-CM | POA: Diagnosis not present

## 2015-12-05 DIAGNOSIS — S0591XA Unspecified injury of right eye and orbit, initial encounter: Secondary | ICD-10-CM

## 2015-12-05 DIAGNOSIS — M1990 Unspecified osteoarthritis, unspecified site: Secondary | ICD-10-CM | POA: Insufficient documentation

## 2015-12-05 DIAGNOSIS — Z7902 Long term (current) use of antithrombotics/antiplatelets: Secondary | ICD-10-CM | POA: Diagnosis not present

## 2015-12-05 DIAGNOSIS — Z87442 Personal history of urinary calculi: Secondary | ICD-10-CM | POA: Diagnosis not present

## 2015-12-05 DIAGNOSIS — S058X1A Other injuries of right eye and orbit, initial encounter: Secondary | ICD-10-CM | POA: Diagnosis not present

## 2015-12-05 DIAGNOSIS — Y9289 Other specified places as the place of occurrence of the external cause: Secondary | ICD-10-CM | POA: Diagnosis not present

## 2015-12-05 DIAGNOSIS — E785 Hyperlipidemia, unspecified: Secondary | ICD-10-CM | POA: Insufficient documentation

## 2015-12-05 DIAGNOSIS — I10 Essential (primary) hypertension: Secondary | ICD-10-CM | POA: Diagnosis not present

## 2015-12-05 DIAGNOSIS — S0590XA Unspecified injury of unspecified eye and orbit, initial encounter: Secondary | ICD-10-CM | POA: Diagnosis not present

## 2015-12-05 DIAGNOSIS — Z79899 Other long term (current) drug therapy: Secondary | ICD-10-CM | POA: Diagnosis not present

## 2015-12-05 DIAGNOSIS — I2510 Atherosclerotic heart disease of native coronary artery without angina pectoris: Secondary | ICD-10-CM | POA: Insufficient documentation

## 2015-12-05 DIAGNOSIS — Y9389 Activity, other specified: Secondary | ICD-10-CM | POA: Diagnosis not present

## 2015-12-05 DIAGNOSIS — K219 Gastro-esophageal reflux disease without esophagitis: Secondary | ICD-10-CM | POA: Diagnosis not present

## 2015-12-05 DIAGNOSIS — W2209XA Striking against other stationary object, initial encounter: Secondary | ICD-10-CM | POA: Insufficient documentation

## 2015-12-05 DIAGNOSIS — Z87438 Personal history of other diseases of male genital organs: Secondary | ICD-10-CM | POA: Insufficient documentation

## 2015-12-05 DIAGNOSIS — H11421 Conjunctival edema, right eye: Secondary | ICD-10-CM | POA: Diagnosis not present

## 2015-12-05 DIAGNOSIS — Z7982 Long term (current) use of aspirin: Secondary | ICD-10-CM | POA: Insufficient documentation

## 2015-12-05 DIAGNOSIS — H1131 Conjunctival hemorrhage, right eye: Secondary | ICD-10-CM | POA: Diagnosis not present

## 2015-12-05 MED ORDER — TROPICAMIDE 1 % OP SOLN
1.0000 [drp] | Freq: Once | OPHTHALMIC | Status: AC
  Administered 2015-12-05: 1 [drp] via OPHTHALMIC
  Filled 2015-12-05: qty 2

## 2015-12-05 MED ORDER — ERYTHROMYCIN 5 MG/GM OP OINT
1.0000 | TOPICAL_OINTMENT | Freq: Once | OPHTHALMIC | Status: AC
Start: 2015-12-05 — End: 2015-12-05
  Administered 2015-12-05: 1 via OPHTHALMIC
  Filled 2015-12-05: qty 3.5

## 2015-12-05 MED ORDER — PHENYLEPHRINE HCL 10 % OP SOLN
1.0000 [drp] | Freq: Once | OPHTHALMIC | Status: AC
  Administered 2015-12-05: 1 [drp] via OPHTHALMIC
  Filled 2015-12-05: qty 5

## 2015-12-05 MED ORDER — TROPICAMIDE 0.5 % OP SOLN
1.0000 [drp] | Freq: Once | OPHTHALMIC | Status: DC
  Filled 2015-12-05: qty 15

## 2015-12-05 MED ORDER — VANCOMYCIN HCL 10 G IV SOLR
1500.0000 mg | Freq: Once | INTRAVENOUS | Status: AC
  Administered 2015-12-05: 1500 mg via INTRAVENOUS
  Filled 2015-12-05: qty 1500

## 2015-12-05 MED ORDER — CEFTAZIDIME 1 G IJ SOLR
2.0000 g | Freq: Once | INTRAMUSCULAR | Status: DC
  Filled 2015-12-05: qty 2

## 2015-12-05 MED ORDER — FENTANYL CITRATE (PF) 100 MCG/2ML IJ SOLN: 50.0000 ug | INTRAMUSCULAR | Status: DC | PRN

## 2015-12-05 MED ORDER — DEXTROSE 5 % IV SOLN
2.0000 g | Freq: Once | INTRAVENOUS | Status: AC
  Administered 2015-12-05: 2 g via INTRAVENOUS
  Filled 2015-12-05 (×2): qty 2

## 2015-12-05 MED ORDER — ERYTHROMYCIN 5 MG/GM OP OINT: 1.0000 "application " | TOPICAL_OINTMENT | Freq: Three times a day (TID) | OPHTHALMIC | Status: DC

## 2015-12-05 NOTE — Consult Note (Signed)
THALMUS BELOTTI                                                                               12/05/2015                                               Pediatric Ophthalmology Consultation                                         Consult requested by: Dr. Maryan Puls   Patient is a 70 y.o. male presenting with general illness. The history is provided by the patient.  Illness Location: Eye issue Severity: Severe Onset quality: Sudden Duration: 2 hours Timing: Constant Progression: Unchanged Chronicity: New Context: Driving lawnmower, hit in the right eye with a tree branch. Immediate pain and decreased vision in eye. Tetanus UTD.  Associated symptoms: no headaches    Past Medical History  Diagnosis Date  . GERD (gastroesophageal reflux disease)   . Headache(784.0)   . Hyperlipidemia   . Hypertension   . Nephrolithiasis     hx of  . Transient ischemic attack     hx of  . BPH (benign prostatic hypertrophy)   . CAD (coronary artery disease)   . Colon cancer ([place])   . Medial meniscus tear 10/11/2011  . Arthritis    Past Surgical History  Procedure Laterality Date  . Transurethral resection of prostate    . Cardiac catheterization  5/12,1/13  . Knee arthroscopy  10/11/2011    Procedure: ARTHROSCOPY KNEE; Surgeon: Lorn Junes, MD; Location: [place]; Service: Orthopedics; Laterality: Left; Left Knee Arthroscopy with Medial and Lateral Partial Menisectomy, Chondroplasty  . Steriod injection  10/11/2011    Procedure: STEROID INJECTION; Surgeon: Lorn Junes, MD; Location: [place]; Service: Orthopedics; Laterality: Right; Steroid Injection Second Toe  . Colon surgery    . Left heart catheterization with coronary angiogram N/A 08/25/2011    Procedure: LEFT HEART CATHETERIZATION WITH CORONARY ANGIOGRAM; Surgeon: Burnell Blanks, MD; Location:  [facility] CATH LAB; Service: Cardiovascular; Laterality: N/A;   Family History  Problem Relation Age of Onset  . Coronary artery disease Other     family hx of male 1st degree relative ,85  . Hyperlipidemia      family hx of  . Hypertension      family hx of  . Arthritis      family hx of  . Heart disease Mother   . Heart disease Father   . Hyperlipidemia Father   . Stroke Father   . Hyperlipidemia Brother   . Heart disease Brother   . Dementia Neg Hx    Social History  Substance Use Topics  . Smoking status: Never Smoker   . Smokeless tobacco: Not on file  . Alcohol Use: No    Review of Systems  Eyes: Positive for pain and visual disturbance.  Musculoskeletal: Negative for back pain and neck pain.  Skin:  Positive for wound.  Neurological: Negative for syncope and headaches.  All other systems reviewed and are negative.     Allergies  Testosterone  Home Medications   Prior to Admission medications   Medication Sig Start Date End Date Taking? Authorizing Provider  aspirin 81 MG tablet Take 81 mg by mouth daily.     Historical Provider, MD  atorvastatin (LIPITOR) 20 MG tablet Take 1 tablet (20 mg total) by mouth daily. 05/20/15   Gay Filler Copland, MD  clopidogrel (PLAVIX) 75 MG tablet TAKE 1 TABLET (75 MG TOTAL) BY MOUTH ONCE DAILY. 05/20/15   Gay Filler Copland, MD  donepezil (ARICEPT) 10 MG tablet Take 1 tablet (10 mg total) by mouth at bedtime. 07/06/15   Melvenia Beam, MD  esomeprazole (NEXIUM) 40 MG capsule Take 1 capsule (40 mg total) by mouth daily before breakfast. 06/13/13   Robyn Haber, MD  fluocinonide-emollient (LIDEX-E) 0.05 % cream Apply 1 application topically 2 (two) times daily. Patient not taking: Reported on 07/15/2015 07/31/14   Robyn Haber, MD  FLUoxetine (PROZAC) 20 MG tablet Take 1 tablet (20 mg total) by mouth daily.  05/20/15 08/18/15  Gay Filler Copland, MD  meloxicam (MOBIC) 7.5 MG tablet TAKE 1 TABLET BY MOUTH ONCE DAILY AS NEEDED 10/27/15   Darreld Mclean, MD  methocarbamol (ROBAXIN) 500 MG tablet One three times daily with meals and 2 at bedtime as needed for muscle relaxant 05/19/15   Posey Boyer, MD  metoprolol tartrate (LOPRESSOR) 25 MG tablet TAKE 1/2 TABLET BY MOUTH TWICE DAILY 07/31/15   Gay Filler Copland, MD  nitroGLYCERIN (NITROSTAT) 0.4 MG SL tablet Place 1 tablet (0.4 mg total) under the tongue every 5 (five) minutes as needed. Chest pain 05/20/15   Gay Filler Copland, MD   BP 135/64 mmHg  Pulse 58  Temp(Src) 98.1 F (36.7 C) (Oral)  Resp 16  Ht 5\' 6"  (1.676 m)  Wt 113.399 kg  BMI 40.37 kg/m2  SpO2 98% Physical Exam  Constitutional: He appears well-developed and well-nourished. No distress.  HENT:  Mouth/Throat: Mucous membranes are not dry.  Eyes: EOM are normal.  Conjuctival laceration at 9 o'clock with subconjunctival hemorrhage. No obvious evidence of scleral damage noted.  Slit lamp exam was otherwise unremarkable Fundus exam showed no evidence of globe perforation.  OS: 20/40 OD: 20/50   Neck: Normal range of motion. No spinous process tenderness present.  Cardiovascular: Normal rate, regular rhythm and intact distal pulses.  Pulmonary/Chest: Effort normal. No respiratory distress.      Imaging Review  Imaging Results (Last 48 hours)    Ct Orbitss W/o Cm  12/05/2015 CLINICAL DATA: Pain after trauma. EXAM: CT ORBITS WITHOUT CONTRAST TECHNIQUE: Multidetector CT imaging of the orbits was performed following the standard protocol without intravenous contrast. COMPARISON: None. FINDINGS: Gauze overlies the right eye with mild overlying soft tissue swelling. The globe is intact based on imaging. No fat stranding in the retrobulbar/ intraconal region. The extra ocular muscles are grossly intact. Limited views of the brain are normal. Other  extracranial soft tissues are normal. A few polyps or mucous retention cysts are seen in maxillary sinuses. Paranasal sinuses are otherwise normal. The right orbit is intact. No orbital fractures are seen. IMPRESSION: Soft tissue swelling in the right periorbital region. The underlying globe is intact. No orbital fractures. Electronically Signed By: Dorise Bullion III M.D On: 12/05/2015 18:28     CT - negative for globe perforation.  I have reviewed and evaluated these images  Final diagnoses:  Eye injury, right, initial encounter   Patient is presenting after sustaining a laceration to his right eye. Tetanus is up-to-date. Given the patient appropriate antibiotics. Patient has mildly decreased visual acuity in his right eye compared to baseline, per the patient's report. Patient doesn't have any pupillary dysfunction on physical exam. Extraocular movements are intact. Did not perform an intraocular pressure exam given the acuity of the patient's presentation. No other evidence of any other acute traumatic injuries on physical exam.  A/P  Conjunctival laceration with subconjunctival hemorrhage. Right eye.  Recommend aggressive lubrication with antibiotic ointment - Erythromycin TID.  F/u in my clinic in 3 weeks for dilated fundus exam and evaluate for retinal tear.         Royston Cowper

## 2015-12-05 NOTE — ED Notes (Signed)
Patient arrived by EMS after struck in right eye by tree limb while mowing. Sclera lacerated and blood noted from same to eye, reports pain and visual changes to eye. Arrived with patch to eye

## 2015-12-05 NOTE — ED Provider Notes (Signed)
CSN: [id number]     Arrival date & time 12/05/15  1620 History   First MD Initiated Contact with Patient 12/05/15 1628     Chief Complaint  Patient presents with  . Eye Injury     (Consider location/radiation/quality/duration/timing/severity/associated sxs/prior Treatment) Patient is a 70 y.o. male presenting with general illness. The history is provided by the patient.  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 Immediate pain and decreased vision in eye. Tetanus UTD.  Associated symptoms: no headaches     Past Medical History  Diagnosis Date  . GERD (gastroesophageal reflux disease)   . Headache(784.0)   . Hyperlipidemia   . Hypertension   . Nephrolithiasis     hx of  . Transient ischemic attack     hx of  . BPH (benign prostatic hypertrophy)   . CAD (coronary artery disease)   . Colon cancer ([place])   . Medial meniscus tear 10/11/2011  . Arthritis    Past Surgical History  Procedure Laterality Date  . Transurethral resection of prostate    . Cardiac catheterization  5/12,1/13  . Knee arthroscopy  10/11/2011    Procedure: ARTHROSCOPY KNEE;  Surgeon: Lorn Junes, MD;  Location: [place];  Service: Orthopedics;  Laterality: Left;  Left Knee Arthroscopy with Medial and Lateral Partial Menisectomy, Chondroplasty  . Steriod injection  10/11/2011    Procedure: STEROID INJECTION;  Surgeon: Lorn Junes, MD;  Location: [place];  Service: Orthopedics;  Laterality: Right;  Steroid Injection Second Toe  . Colon surgery    . Left heart catheterization with coronary angiogram N/A 08/25/2011    Procedure: LEFT HEART CATHETERIZATION WITH CORONARY ANGIOGRAM;  Surgeon: Burnell Blanks, MD;  Location: [facility] CATH LAB;  Service: Cardiovascular;  Laterality: N/A;   Family  History  Problem Relation Age of Onset  . Coronary artery disease Other     family hx of male 1st degree relative ,26  . Hyperlipidemia      family hx of  . Hypertension      family hx of  . Arthritis      family hx of  . Heart disease Mother   . Heart disease Father   . Hyperlipidemia Father   . Stroke Father   . Hyperlipidemia Brother   . Heart disease Brother   . Dementia Neg Hx    Social History  Substance Use Topics  . Smoking status: Never Smoker   . Smokeless tobacco: Not on file  . Alcohol Use: No    Review of Systems  Eyes: Positive for pain and visual disturbance.  Musculoskeletal: Negative for back pain and neck pain.  Skin: Positive for wound.  Neurological: Negative for syncope and headaches.  All other systems reviewed and are negative.     Allergies  Testosterone  Home Medications   Prior to Admission medications   Medication Sig Start Date End Date Taking? Authorizing Provider  aspirin 81 MG tablet Take 81 mg by mouth daily.      Historical Provider, MD  atorvastatin (LIPITOR) 20 MG tablet Take 1 tablet (20 mg total) by mouth daily. 05/20/15   Gay Filler Copland, MD  clopidogrel (PLAVIX) 75 MG tablet TAKE 1 TABLET (75 MG TOTAL) BY MOUTH ONCE DAILY. 05/20/15   Darreld Mclean, MD  donepezil (ARICEPT) 10 MG tablet Take 1 tablet (10 mg total) by mouth at bedtime. 07/06/15   Melvenia Beam, MD  esomeprazole (NEXIUM) 40 MG capsule Take 1 capsule (40 mg total) by mouth daily before breakfast. 06/13/13   Robyn Haber, MD  fluocinonide-emollient (LIDEX-E) 0.05 % cream Apply 1 application topically 2 (two) times daily. Patient not taking: Reported on 07/15/2015 07/31/14   Robyn Haber, MD  FLUoxetine (PROZAC) 20 MG tablet Take 1 tablet (20 mg total) by mouth daily. 05/20/15 08/18/15  Gay Filler Copland, MD  meloxicam (MOBIC) 7.5 MG tablet TAKE 1 TABLET BY MOUTH ONCE DAILY AS NEEDED 10/27/15   Darreld Mclean, MD  methocarbamol (ROBAXIN) 500 MG tablet  One three times daily with meals and 2 at bedtime as needed for muscle relaxant 05/19/15   Posey Boyer, MD  metoprolol tartrate (LOPRESSOR) 25 MG tablet TAKE 1/2 TABLET BY MOUTH TWICE DAILY 07/31/15   Gay Filler Copland, MD  nitroGLYCERIN (NITROSTAT) 0.4 MG SL tablet Place 1 tablet (0.4 mg total) under the tongue every 5 (five) minutes as needed. Chest pain 05/20/15   Gay Filler Copland, MD   BP 135/64 mmHg  Pulse 58  Temp(Src) 98.1 F (36.7 C) (Oral)  Resp 16  Ht 5\' 6"  (1.676 m)  Wt 113.399 kg  BMI 40.37 kg/m2  SpO2 98% Physical Exam  Constitutional: He appears well-developed and well-nourished. No distress.  HENT:  Mouth/Throat: Mucous membranes are not dry.  Eyes: EOM are normal.    OS: 20/40 OD: 20/50  Neck: Normal range of motion. No spinous process tenderness present.  Cardiovascular: Normal rate, regular rhythm and intact distal pulses.   Pulmonary/Chest: Effort normal. No respiratory distress.    ED Course  Procedures (including critical care time) Labs Review Labs Reviewed - No data to display  Imaging Review Ct Orbitss W/o Cm  12/05/2015  CLINICAL DATA:  Pain after trauma. EXAM: CT ORBITS WITHOUT CONTRAST TECHNIQUE: Multidetector CT imaging of the orbits was performed following the standard protocol without intravenous contrast. COMPARISON:  None. FINDINGS: Gauze overlies the right eye with mild overlying soft tissue swelling. The globe is intact based on imaging. No fat stranding in the retrobulbar/ intraconal region. The extra ocular muscles are grossly intact. Limited views of the brain are normal. Other extracranial soft tissues are normal. A few polyps or mucous retention cysts are seen in maxillary sinuses. Paranasal sinuses are otherwise normal. The right orbit is intact. No orbital fractures are seen. IMPRESSION: Soft tissue swelling in the right periorbital region. The underlying globe is intact. No orbital fractures. Electronically Signed   By: Dorise Bullion  III M.D   On: 12/05/2015 18:28   I have personally reviewed and evaluated these images and lab results as part of my medical decision-making.   EKG Interpretation None      MDM   Final diagnoses:  Eye injury, right, initial encounter   Patient is presenting after sustaining a laceration to his right eye. Tetanus is up-to-date. Given the patient appropriate antibiotics. Patient has mildly decreased visual acuity in his right eye compared to baseline, per the patient's report. Patient doesn't have any pupillary dysfunction on physical exam. Extraocular movements are intact. Did not perform an intraocular pressure exam given the acuity of the patient's presentation. No other evidence of any other acute traumatic injuries on physical exam.  Contacted ophthalmology. They will agree with getting the CT orbits for the patient.  CT orbits didn't show any evidence of open globe.  Contacted ophthalmology again. They will evaluate the patient at bedside.  7:26 PM Order tropicamide as well as phenylephrine at the request of the ophthalmologist. They're on their way to evaluate the patient.  9:59 PM Per ophthalmology the patient is ready for discharge at this time. Recommended starting the patient on erythromycin ointment 3 times a day. First dose given here. We'll have him follow-up with Dr. Posey Pronto, ophthalmologist, on an outpatient basis in 3-4 weeks.  Strict return precautions provided.  Patient discharged in stable condition.  Maryan Puls, MD 12/05/15 [id number]  Pattricia Boss, MD 12/07/15 [id number]

## 2016-02-03 DIAGNOSIS — H11431 Conjunctival hyperemia, right eye: Secondary | ICD-10-CM | POA: Diagnosis not present

## 2016-02-03 DIAGNOSIS — H11421 Conjunctival edema, right eye: Secondary | ICD-10-CM | POA: Diagnosis not present

## 2016-02-03 DIAGNOSIS — H35373 Puckering of macula, bilateral: Secondary | ICD-10-CM | POA: Diagnosis not present

## 2016-02-03 DIAGNOSIS — H1131 Conjunctival hemorrhage, right eye: Secondary | ICD-10-CM | POA: Diagnosis not present

## 2016-02-03 DIAGNOSIS — S058X1D Other injuries of right eye and orbit, subsequent encounter: Secondary | ICD-10-CM | POA: Diagnosis not present

## 2016-02-16 DIAGNOSIS — L237 Allergic contact dermatitis due to plants, except food: Secondary | ICD-10-CM | POA: Diagnosis not present

## 2016-03-25 DIAGNOSIS — J019 Acute sinusitis, unspecified: Secondary | ICD-10-CM | POA: Diagnosis not present

## 2016-03-25 DIAGNOSIS — L02411 Cutaneous abscess of right axilla: Secondary | ICD-10-CM | POA: Diagnosis not present

## 2016-03-25 DIAGNOSIS — B9689 Other specified bacterial agents as the cause of diseases classified elsewhere: Secondary | ICD-10-CM | POA: Diagnosis not present

## 2016-03-25 DIAGNOSIS — R42 Dizziness and giddiness: Secondary | ICD-10-CM | POA: Diagnosis not present

## 2016-03-31 DIAGNOSIS — Z79899 Other long term (current) drug therapy: Secondary | ICD-10-CM | POA: Diagnosis not present

## 2016-03-31 DIAGNOSIS — E785 Hyperlipidemia, unspecified: Secondary | ICD-10-CM | POA: Diagnosis not present

## 2016-03-31 DIAGNOSIS — Z85038 Personal history of other malignant neoplasm of large intestine: Secondary | ICD-10-CM | POA: Diagnosis not present

## 2016-03-31 DIAGNOSIS — Z23 Encounter for immunization: Secondary | ICD-10-CM | POA: Diagnosis not present

## 2016-03-31 DIAGNOSIS — Z0000 Encounter for general adult medical examination without abnormal findings: Secondary | ICD-10-CM | POA: Diagnosis not present

## 2016-04-11 DIAGNOSIS — K219 Gastro-esophageal reflux disease without esophagitis: Secondary | ICD-10-CM | POA: Diagnosis not present

## 2016-06-14 ENCOUNTER — Other Ambulatory Visit: Payer: Self-pay | Admitting: Family Medicine

## 2016-06-14 ENCOUNTER — Other Ambulatory Visit: Payer: Self-pay | Admitting: Emergency Medicine

## 2016-06-14 DIAGNOSIS — E785 Hyperlipidemia, unspecified: Secondary | ICD-10-CM

## 2016-06-14 MED ORDER — ATORVASTATIN CALCIUM 20 MG PO TABS: 20.0000 mg | ORAL_TABLET | Freq: Every day | ORAL | 0 refills | Status: DC

## 2016-08-19 DIAGNOSIS — M5441 Lumbago with sciatica, right side: Secondary | ICD-10-CM | POA: Diagnosis not present

## 2016-08-19 DIAGNOSIS — G250 Essential tremor: Secondary | ICD-10-CM | POA: Diagnosis not present

## 2016-11-09 DIAGNOSIS — M5412 Radiculopathy, cervical region: Secondary | ICD-10-CM | POA: Diagnosis not present

## 2016-11-09 DIAGNOSIS — Z79899 Other long term (current) drug therapy: Secondary | ICD-10-CM | POA: Diagnosis not present

## 2016-11-09 DIAGNOSIS — G250 Essential tremor: Secondary | ICD-10-CM | POA: Diagnosis not present

## 2016-11-09 DIAGNOSIS — Z9181 History of falling: Secondary | ICD-10-CM | POA: Diagnosis not present

## 2016-11-09 DIAGNOSIS — Z1389 Encounter for screening for other disorder: Secondary | ICD-10-CM | POA: Diagnosis not present

## 2016-11-21 DIAGNOSIS — I119 Hypertensive heart disease without heart failure: Secondary | ICD-10-CM | POA: Diagnosis not present

## 2016-11-21 DIAGNOSIS — J019 Acute sinusitis, unspecified: Secondary | ICD-10-CM | POA: Diagnosis not present

## 2016-11-21 DIAGNOSIS — B9689 Other specified bacterial agents as the cause of diseases classified elsewhere: Secondary | ICD-10-CM | POA: Diagnosis not present

## 2016-11-21 DIAGNOSIS — M7061 Trochanteric bursitis, right hip: Secondary | ICD-10-CM | POA: Diagnosis not present

## 2016-12-12 ENCOUNTER — Ambulatory Visit (INDEPENDENT_AMBULATORY_CARE_PROVIDER_SITE_OTHER): Payer: Medicare Other | Admitting: Neurology

## 2016-12-12 ENCOUNTER — Encounter (INDEPENDENT_AMBULATORY_CARE_PROVIDER_SITE_OTHER): Payer: Self-pay

## 2016-12-12 VITALS — BP 148/98 | HR 88 | Ht 65.0 in | Wt 220.0 lb

## 2016-12-12 DIAGNOSIS — G20 Parkinson's disease: Secondary | ICD-10-CM | POA: Diagnosis not present

## 2016-12-12 MED ORDER — CARBIDOPA-LEVODOPA 25-100 MG PO TABS: 0.5000 | ORAL_TABLET | Freq: Three times a day (TID) | ORAL | 6 refills | Status: DC
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
## 2016-12-12 NOTE — Patient Instructions (Addendum)
Remember to drink plenty of fluid, eat healthy meals and do not skip any meals. Try to eat protein with a every meal and eat a healthy snack such as fruit or nuts in between meals. Try to keep a regular sleep-wake schedule and try to exercise daily, particularly in the form of walking, 20-30 minutes a day, if you can.   As far as your medications are concerned, I would like to suggest: SInemet (Carbidopa/Levodopa) 1/2 pill 3x a day. Try to take it 4 hours apart(for example 8am, noon and 4pm). Try to separate Sinemet from food (especially protein-rich foods like meat, dairy, eggs) by about 30-60 mins - this will help the absorption of the medication. If you have some nausea with the medication, you can take it with some light food like crackers or ginger ale  I would like to see you back in 8 weeks, sooner if we need to. Please call us with any interim questions, concerns, problems, updates or refill requests.   Our phone number is [phone]. We also have an after hours call service for urgent matters and there is a physician on-call for urgent questions. For any emergencies you know to call 911 or go to the nearest emergency room   Parkinson Disease Parkinson disease is a long-term (chronic) condition that gets worse over time (is progressive). Parkinson disease limits your ability to control your movements and move your body normally. This condition is a type of movement disorder. Each person with Parkinson disease is affected differently. The condition can range from mild to severe. Parkinson disease tends to progress slowly over several years. What are the causes? Parkinson disease results from a loss of brain cells (neurons) in a specific part of the brain (substantia nigra). Some of the neurons in the substantia nigra make an important brain chemical (dopamine). Dopamine is needed to control movement. As the condition gets worse, neurons make less dopamine. This makes it hard to move or control  your movements. The exact cause of why neurons are lost or produce less dopamine is not known. Genetic and environmental factors may contribute to the cause of Parkinson disease. What increases the risk? This condition is more likely to develop in:  Men.  People who are 11 years of age or older.  People who have a family history of Parkinson disease. What are the signs or symptoms? Symptoms of this condition can vary from person to person. The main (primary) symptoms are related to movement (motor symptoms). These include:  Uncontrolled shaking movements (tremor). Tremors usually start in a hand or foot when you are resting (resting tremor). The tremor may stop when you move around.  Slowing of movement. You may lose facial expression and have trouble making the small movements that are needed to button clothing or brush your teeth. You may walk with short, shuffling steps.  Stiff movement (rigidity). This mostly affects your arms, legs, neck, and upper body. You may walk without swinging your arms. Rigidity can be painful.  Loss of balance and stability when standing. You may sway, fall backward, and have trouble making turns. Secondary motor symptoms of this condition include:  Shrinking handwriting.  Stooped posture.  Slowed speech.  Trouble swallowing.  Drooling.  Sexual dysfunction.  Muscle cramps.  Loss of smell. Additional symptoms that are not related to movement include:  Constipation.  Mood swings.  Depression or anxiety.  Sleep disturbances.  Confusion.  Loss of mental abilities (dementia).  Low blood pressure.  Trouble concentrating. How  is this diagnosed? Parkinson disease can be hard to diagnose in its early stages. A diagnosis may be made based on symptoms, a medical history, and physical exam. During your exam, your health care provider will look for:  Lack of facial expression.  Resting tremor.  Stiffness in your neck, arms, and  legs.  Abnormal walk.  Trouble with balance. You may have brain imaging tests done to check for a loss of dopamine-producing areas of the brain. Your healthcare provider may also grade the severity of your condition as mild, moderate, or advanced. Parkinson disease progression is different for everyone. You may not progress to the advanced stage. Mild Parkinson disease involves:  Movement problems that do not affect daily activities.  Movement problems on one side of the body.  Movement problems that are controlled with medicines.  Good response to exercise. Moderate Parkinson disease involves:  Movement problems on both sides of the body.  Slowing of movement.  Coordination and balance problems.  Less of a response to medicine.  More side effects from medicines. Advanced Parkinson disease involves:  Extreme difficulty walking.  Inability to live alone safely.  Signs of dementia.  Difficulty controlling symptoms with medicine. How is this treated? There is no cure for Parkinson disease. Treatment focuses on relieving your symptoms. Treatment may include:  Medicines.  Speech, occupational, and physical therapy.  Surgery. Everyone responds to medicines differently. Your response may change over time. Work with your health care provider to find the best medicines for you. These may include:  Dopamine replacement drugs. These are the most effective medicines. A long-term side effect of these medicines is uncontrolled movements (dyskinesias).  Dopamine agonists. These drugs act like dopamine to stimulate dopamine receptors in the brain. Side effects include nausea and sleepiness, but they cause less dyskinesia.  Other medicines to reduce tremor, prevent dopamine breakdown, reduce dyskinesia, and reduce dementia that is related to Parkinson disease. Another treatment is deep brain stimulation surgery to reduce tremors and dyskinesia. This procedure involves placing  electrodes in the brain. The electrodes are attached to an electric pulse generator that acts like a pacemaker for your brain. This may be an option if you have had the condition for at least four years and are not responding well to medicines. Follow these instructions at home:  Take over-the-counter and prescription medicines only as told by your health care provider.  Install grab bars and railings in your home to prevent falls.  Follow instructions from your health care provider about eating or drinking restrictions.  Return to your normal activities as told by your health care provider. Ask your health care provider what activities are safe for you.  Get regular exercise as told by your health care provider or a physical therapist.  Keep all follow-up visits as told by your health care provider. This is important. These include any visits with a speech therapist or occupational therapist.  Consider joining a support group for people with Parkinson disease. Contact a health care provider if:  Medicines do not help your symptoms.  You are unsteady or have fallen at home.  You need more support to function well at home.  You have trouble swallowing.  You have severe constipation.  You are struggling with side effects from your medicines.  You see or hear things that are not real (hallucinate).  You feel confused, anxious, or depressed. Get help right away if:  You are injured after a fall.  You cannot swallow without choking.  You  have chest pain or trouble breathing.  You do not feel safe at home. This information is not intended to replace advice given to you by your health care provider. Make sure you discuss any questions you have with your health care provider. Document Released: 07/15/2000 Document Revised: 12/21/2015 Document Reviewed: 05/08/2015 Elsevier Interactive Patient Education  2017 [employer].

## 2016-12-13 ENCOUNTER — Other Ambulatory Visit: Payer: Self-pay | Admitting: Neurology

## 2016-12-20 ENCOUNTER — Ambulatory Visit (HOSPITAL_COMMUNITY)
Admission: RE | Admit: 2016-12-20 | Discharge: 2016-12-20 | Disposition: A | Discharge status: Home / Self Care | Payer: Medicare Other | Source: Ambulatory Visit | Admitting: Neurology

## 2016-12-20 ENCOUNTER — Encounter (HOSPITAL_COMMUNITY): Payer: Self-pay

## 2016-12-20 ENCOUNTER — Encounter (HOSPITAL_COMMUNITY)
Admission: RE | Admit: 2016-12-20 | Discharge: 2016-12-20 | Disposition: A | Discharge status: Home / Self Care | Payer: Medicare Other | Source: Ambulatory Visit | Attending: Neurology | Admitting: Neurology

## 2016-12-20 ENCOUNTER — Ambulatory Visit (HOSPITAL_COMMUNITY)
Admission: RE | Admit: 2016-12-20 | Discharge: 2016-12-20 | Disposition: A | Discharge status: Home / Self Care | Payer: Medicare Other | Source: Ambulatory Visit | Attending: Neurology | Admitting: Neurology

## 2016-12-20 DIAGNOSIS — G20 Parkinson's disease: Secondary | ICD-10-CM | POA: Insufficient documentation

## 2016-12-20 DIAGNOSIS — R9402 Abnormal brain scan: Secondary | ICD-10-CM | POA: Diagnosis not present

## 2016-12-20 MED ORDER — IOFLUPANE I 123 185 MBQ/2.5ML IV SOLN: 5.0000 | Freq: Once | INTRAVENOUS | Status: DC

## 2016-12-20 MED ORDER — IOFLUPANE I 123 185 MBQ/2.5ML IV SOLN
5.0000 | Freq: Once | INTRAVENOUS | Status: AC
  Administered 2016-12-20: 5 via INTRAVENOUS
  Filled 2016-12-20: qty 5

## 2016-12-21 ENCOUNTER — Telehealth: Payer: Self-pay | Admitting: *Deleted

## 2016-12-21 ENCOUNTER — Telehealth: Payer: Self-pay | Admitting: Neurology

## 2016-12-21 NOTE — Telephone Encounter (Signed)
LVM informing patient that per Dr Jaynee Eagles, his Dat scan  imaging was consistent with a diagnosis of parkinson's syndrome. Left number for any questions.

## 2016-12-21 NOTE — Telephone Encounter (Signed)
error 

## 2016-12-30 ENCOUNTER — Telehealth: Payer: Self-pay | Admitting: Neurology

## 2016-12-30 NOTE — Telephone Encounter (Signed)
Patient called office in reference to carbidopa-levodopa (SINEMET IR) 25-100 MG tablet.  Patient states the medication is not to good feels it has not helped him any.  Patient said when he takes it all he does it sleep.  Please call

## 2016-12-30 NOTE — Telephone Encounter (Signed)
I spoke to pt and he stated since taking the sinemet 25/100   1/2 tablet po TID, has noted no effect of his tremors, and the medication causes him to sleep. (sleepy all the time).  He states he is very sensitive to medication as it is.  He has appt with MM/NP 02/13/17.   Please advise.  He asked what stage he was relating to dat scan results.  I told him that when in for appt with MM/NP in 01/2017 she can address, but would ask Dr. Jaynee Eagles now.

## 2017-01-01 NOTE — Telephone Encounter (Signed)
Sandy , why don;t you schedule him earlier with me so we can discuss in the office thanks

## 2017-01-02 ENCOUNTER — Encounter: Payer: Self-pay | Admitting: Neurology

## 2017-01-02 ENCOUNTER — Ambulatory Visit (INDEPENDENT_AMBULATORY_CARE_PROVIDER_SITE_OTHER): Payer: Medicare Other | Admitting: Neurology

## 2017-01-02 DIAGNOSIS — G20 Parkinson's disease: Secondary | ICD-10-CM | POA: Diagnosis not present

## 2017-01-02 DIAGNOSIS — G20A1 Parkinson's disease without dyskinesia, without mention of fluctuations: Secondary | ICD-10-CM | POA: Insufficient documentation

## 2017-01-02 MED ORDER — ROPINIROLE HCL 0.25 MG PO TABS: ORAL_TABLET | ORAL | 3 refills | Status: DC

## 2017-01-02 NOTE — Patient Instructions (Addendum)
Remember to drink plenty of fluid, eat healthy meals and do not skip any meals. Try to eat protein with a every meal and eat a healthy snack such as fruit or nuts in between meals. Try to keep a regular sleep-wake schedule and try to exercise daily, particularly in the form of walking, 20-30 minutes a day, if you can.   As far as your medications are concerned, I would like to suggest:  Requip (Ropinerole):  Take 1 tablet (0.25 mg total) by mouth 3 (three) times daily for one week.  Then take 2 tablets (0.50 mg total) by mouth 3 (three) times daily for one week.  Then take 3 tablets (0.75 mg total) by mouth 3 (three) times daily for one week. Then take 4 tablets (1 mg total) by mouth 3 (three) times daily.  I would like to see you back in 3 onths, sooner if we need to. Please call us with any interim questions, concerns, problems, updates or refill requests.   Our phone number is [phone]. We also have an after hours call service for urgent matters and there is a physician on-call for urgent questions. For any emergencies you know to call 911 or go to the nearest emergency room  Ropinirole tablets What is this medicine? ROPINIROLE (roe PIN i role) is used to treat the symptoms of Parkinson's disease. It helps to improve muscle control and movement difficulties. It is also used for the treatment of Restless Legs Syndrome. This medicine may be used for other purposes; ask your health care provider or pharmacist if you have questions. COMMON BRAND NAME(S): Requip What should I tell my health care provider before I take this medicine? They need to know if you have any of these conditions: -dizzy or fainting spells -heart disease -high blood pressure -kidney disease -liver disease -low blood pressure -sleeping problems -an unusual or allergic reaction to ropinirole, other medicines, foods, dyes, or preservatives -pregnant or trying to get pregnant -breast-feeding How should I use this  medicine? Take this medicine by mouth with a glass of water. Follow the directions on the prescription label. You can take it with or without food. If it upsets your stomach, take it with food. Take your doses at regular intervals. Do not take your medicine more often than directed. Do not stop taking this medicine except on your doctor's advice. Stopping this medicine too quickly may cause serious side effects. Talk to your pediatrician regarding the use of this medicine in children. Special care may be needed. Overdosage: If you think you have taken too much of this medicine contact a poison control center or emergency room at once. NOTE: This medicine is only for you. Do not share this medicine with others. What if I miss a dose? If you miss a dose, take it as soon as you can. If it is almost time for your next dose, take only that dose. Do not take double or extra doses. What may interact with this medicine? -ciprofloxacin -male hormones, like estrogens and birth control pills -medicines for depression, anxiety, or psychotic disturbances -metoclopramide -mexiletine -norfloxacin -omeprazole This list may not describe all possible interactions. Give your health care provider a list of all the medicines, herbs, non-prescription drugs, or dietary supplements you use. Also tell them if you smoke, drink alcohol, or use illegal drugs. Some items may interact with your medicine. What should I watch for while using this medicine? Visit your doctor or health care professional for regular checks on your progress.  It may be several weeks or months before you feel the full effect of this medicine. You may get drowsy or dizzy. Do not drive, use machinery, or do anything that needs mental alertness until you know how this drug affects you. Do not stand or sit up quickly, especially if you are an older patient. This reduces the risk of dizzy or fainting spells. Alcohol can increase possible dizziness. Avoid  alcoholic drinks. If you find that you have sudden feelings of wanting to sleep during normal activities, like cooking, watching television, or while driving or riding in a car, you should contact your health care professional. Your mouth may get dry. Chewing sugarless gum or sucking hard candy, and drinking plenty of water may help. Contact your doctor if the problem does not go away or is severe. There have been reports of increased sexual urges or other strong urges such as gambling while taking some medicines for Parkinson's disease. If you experience any of these urges while taking this medicine, you should report it to your health care provider as soon as possible. You should check your skin often for changes to moles and new growths while taking this medicine. Call your doctor if you notice any of these changes. What side effects may I notice from receiving this medicine? Side effects that you should report to your doctor or health care professional as soon as possible: -allergic reactions like skin rash, itching or hives, swelling of the face, lips, or tongue -changes in vision -chest pain -confusion -falling asleep during normal activities like driving -fast, irregular heartbeat -feeling faint or lightheaded, falls -hallucination, loss of contact with reality -joint or muscle pain -loss of bladder control -loss of memory -new or increased gambling urges, sexual urges, uncontrolled spending, binge or compulsive eating, or other urges -pain, tingling, numbness in the hands or feet -shortness of breath, troubled breathing, tightness in chest, or wheezing -signs and symptoms of low blood pressure like dizziness; feeling faint or lightheaded, falls; unusually weak or tired -swelling of the ankles, feet, hands -uncontrollable head, mouth, neck, arm, or leg movements -vomiting Side effects that usually do not require medical attention (report to your doctor or health care professional if  they continue or are bothersome): -dizziness -drowsiness -headache -increased sweating -nausea -tremors This list may not describe all possible side effects. Call your doctor for medical advice about side effects. You may report side effects to FDA at 1-800-FDA-1088. Where should I keep my medicine? Keep out of the reach of children. Store at room temperature between 20 and 25 degrees C (68 and 77 degrees F). Protect from light and moisture. Keep container tightly closed. Throw away any unused medicine after the expiration date. NOTE: This sheet is a summary. It may not cover all possible information. If you have questions about this medicine, talk to your doctor, pharmacist, or health care provider.  2018 Elsevier/Gold Standard (2016-01-04 10:58:05)   Parkinson Disease Parkinson disease is a long-term (chronic) condition that gets worse over time (is progressive). Parkinson disease limits your ability to control your movements and move your body normally. This condition is a type of movement disorder. Each person with Parkinson disease is affected differently. The condition can range from mild to severe. Parkinson disease tends to progress slowly over several years. What are the causes? Parkinson disease results from a loss of brain cells (neurons) in a specific part of the brain (substantia nigra). Some of the neurons in the substantia nigra make an important brain chemical (  dopamine). Dopamine is needed to control movement. As the condition gets worse, neurons make less dopamine. This makes it hard to move or control your movements. The exact cause of why neurons are lost or produce less dopamine is not known. Genetic and environmental factors may contribute to the cause of Parkinson disease. What increases the risk? This condition is more likely to develop in:  Men.  People who are 68 years of age or older.  People who have a family history of Parkinson disease.  What are the signs  or symptoms? Symptoms of this condition can vary from person to person. The main (primary) symptoms are related to movement (motor symptoms). These include:  Uncontrolled shaking movements (tremor). Tremors usually start in a hand or foot when you are resting (resting tremor). The tremor may stop when you move around.  Slowing of movement. You may lose facial expression and have trouble making the small movements that are needed to button clothing or brush your teeth. You may walk with short, shuffling steps.  Stiff movement (rigidity). This mostly affects your arms, legs, neck, and upper body. You may walk without swinging your arms. Rigidity can be painful.  Loss of balance and stability when standing. You may sway, fall backward, and have trouble making turns.  Secondary motor symptoms of this condition include:  Shrinking handwriting.  Stooped posture.  Slowed speech.  Trouble swallowing.  Drooling.  Sexual dysfunction.  Muscle cramps.  Loss of smell.  Additional symptoms that are not related to movement include:  Constipation.  Mood swings.  Depression or anxiety.  Sleep disturbances.  Confusion.  Loss of mental abilities (dementia).  Low blood pressure.  Trouble concentrating.  How is this diagnosed? Parkinson disease can be hard to diagnose in its early stages. A diagnosis may be made based on symptoms, a medical history, and physical exam. During your exam, your health care provider will look for:  Lack of facial expression.  Resting tremor.  Stiffness in your neck, arms, and legs.  Abnormal walk.  Trouble with balance.  You may have brain imaging tests done to check for a loss of dopamine-producing areas of the brain. Your healthcare provider may also grade the severity of your condition as mild, moderate, or advanced. Parkinson disease progression is different for everyone. You may not progress to the advanced stage. Mild Parkinson disease  involves:  Movement problems that do not affect daily activities.  Movement problems on one side of the body.  Movement problems that are controlled with medicines.  Good response to exercise.  Moderate Parkinson disease involves:  Movement problems on both sides of the body.  Slowing of movement.  Coordination and balance problems.  Less of a response to medicine.  More side effects from medicines.  Advanced Parkinson disease involves:  Extreme difficulty walking.  Inability to live alone safely.  Signs of dementia.  Difficulty controlling symptoms with medicine.  How is this treated? There is no cure for Parkinson disease. Treatment focuses on relieving your symptoms. Treatment may include:  Medicines.  Speech, occupational, and physical therapy.  Surgery.  Everyone responds to medicines differently. Your response may change over time. Work with your health care provider to find the best medicines for you. These may include:  Dopamine replacement drugs. These are the most effective medicines. A long-term side effect of these medicines is uncontrolled movements (dyskinesias).  Dopamine agonists. These drugs act like dopamine to stimulate dopamine receptors in the brain. Side effects include nausea and sleepiness,  but they cause less dyskinesia.  Other medicines to reduce tremor, prevent dopamine breakdown, reduce dyskinesia, and reduce dementia that is related to Parkinson disease.  Another treatment is deep brain stimulation surgery to reduce tremors and dyskinesia. This procedure involves placing electrodes in the brain. The electrodes are attached to an electric pulse generator that acts like a pacemaker for your brain. This may be an option if you have had the condition for at least four years and are not responding well to medicines. Follow these instructions at home:  Take over-the-counter and prescription medicines only as told by your health care  provider.  Install grab bars and railings in your home to prevent falls.  Follow instructions from your health care provider about eating or drinking restrictions.  Return to your normal activities as told by your health care provider. Ask your health care provider what activities are safe for you.  Get regular exercise as told by your health care provider or a physical therapist.  Keep all follow-up visits as told by your health care provider. This is important. These include any visits with a speech therapist or occupational therapist.  Consider joining a support group for people with Parkinson disease. Contact a health care provider if:  Medicines do not help your symptoms.  You are unsteady or have fallen at home.  You need more support to function well at home.  You have trouble swallowing.  You have severe constipation.  You are struggling with side effects from your medicines.  You see or hear things that are not real (hallucinate).  You feel confused, anxious, or depressed. Get help right away if:  You are injured after a fall.  You cannot swallow without choking.  You have chest pain or trouble breathing.  You do not feel safe at home. This information is not intended to replace advice given to you by your health care provider. Make sure you discuss any questions you have with your health care provider. Document Released: 07/15/2000 Document Revised: 12/21/2015 Document Reviewed: 05/08/2015 Elsevier Interactive Patient Education  [employer].

## 2017-01-02 NOTE — Progress Notes (Signed)
Requip rx printed, signed and faxed to [facility].

## 2017-01-02 NOTE — Telephone Encounter (Signed)
Spoke to pt and he said could come in at 1300 for appt today to go over dat scan results and discuss med options. Sinemet not effective.
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
## 2017-01-18 DIAGNOSIS — M792 Neuralgia and neuritis, unspecified: Secondary | ICD-10-CM | POA: Diagnosis not present

## 2017-01-18 DIAGNOSIS — M2041 Other hammer toe(s) (acquired), right foot: Secondary | ICD-10-CM | POA: Diagnosis not present

## 2017-02-13 ENCOUNTER — Ambulatory Visit: Payer: Medicare Other | Admitting: Adult Health

## 2017-03-02 DIAGNOSIS — Z0000 Encounter for general adult medical examination without abnormal findings: Secondary | ICD-10-CM | POA: Diagnosis not present

## 2017-03-02 DIAGNOSIS — Z1159 Encounter for screening for other viral diseases: Secondary | ICD-10-CM | POA: Diagnosis not present

## 2017-03-02 DIAGNOSIS — R0602 Shortness of breath: Secondary | ICD-10-CM | POA: Diagnosis not present

## 2017-03-02 DIAGNOSIS — M542 Cervicalgia: Secondary | ICD-10-CM | POA: Diagnosis not present

## 2017-03-02 DIAGNOSIS — Z79899 Other long term (current) drug therapy: Secondary | ICD-10-CM | POA: Diagnosis not present

## 2017-03-09 DIAGNOSIS — M7061 Trochanteric bursitis, right hip: Secondary | ICD-10-CM | POA: Diagnosis not present

## 2017-03-09 DIAGNOSIS — M542 Cervicalgia: Secondary | ICD-10-CM | POA: Diagnosis not present

## 2017-03-14 DIAGNOSIS — M25562 Pain in left knee: Secondary | ICD-10-CM | POA: Diagnosis not present

## 2017-03-14 DIAGNOSIS — M542 Cervicalgia: Secondary | ICD-10-CM | POA: Diagnosis not present

## 2017-03-17 DIAGNOSIS — M542 Cervicalgia: Secondary | ICD-10-CM | POA: Diagnosis not present

## 2017-03-17 DIAGNOSIS — M25562 Pain in left knee: Secondary | ICD-10-CM | POA: Diagnosis not present

## 2017-03-21 DIAGNOSIS — M25562 Pain in left knee: Secondary | ICD-10-CM | POA: Diagnosis not present

## 2017-03-21 DIAGNOSIS — M542 Cervicalgia: Secondary | ICD-10-CM | POA: Diagnosis not present

## 2017-03-23 DIAGNOSIS — M542 Cervicalgia: Secondary | ICD-10-CM | POA: Diagnosis not present

## 2017-03-23 DIAGNOSIS — M25562 Pain in left knee: Secondary | ICD-10-CM | POA: Diagnosis not present

## 2017-03-29 DIAGNOSIS — Z7982 Long term (current) use of aspirin: Secondary | ICD-10-CM | POA: Insufficient documentation

## 2017-03-29 HISTORY — DX: Long term (current) use of aspirin: Z79.82

## 2017-03-31 DIAGNOSIS — Z7982 Long term (current) use of aspirin: Secondary | ICD-10-CM | POA: Diagnosis not present

## 2017-03-31 DIAGNOSIS — I10 Essential (primary) hypertension: Secondary | ICD-10-CM | POA: Diagnosis not present

## 2017-03-31 DIAGNOSIS — E7800 Pure hypercholesterolemia, unspecified: Secondary | ICD-10-CM | POA: Diagnosis not present

## 2017-03-31 DIAGNOSIS — I2510 Atherosclerotic heart disease of native coronary artery without angina pectoris: Secondary | ICD-10-CM | POA: Diagnosis not present

## 2017-03-31 DIAGNOSIS — R0602 Shortness of breath: Secondary | ICD-10-CM

## 2017-03-31 HISTORY — DX: Shortness of breath: R06.02

## 2017-04-13 ENCOUNTER — Ambulatory Visit: Payer: Medicare Other | Admitting: Adult Health

## 2017-04-19 DIAGNOSIS — M542 Cervicalgia: Secondary | ICD-10-CM | POA: Diagnosis not present

## 2017-04-19 DIAGNOSIS — M25562 Pain in left knee: Secondary | ICD-10-CM | POA: Diagnosis not present

## 2017-04-20 NOTE — Telephone Encounter (Signed)
Patient has not returned phone calls. Records have been shredded.

## 2017-04-26 DIAGNOSIS — M542 Cervicalgia: Secondary | ICD-10-CM | POA: Diagnosis not present

## 2017-04-26 DIAGNOSIS — M25562 Pain in left knee: Secondary | ICD-10-CM | POA: Diagnosis not present

## 2017-05-02 DIAGNOSIS — M5489 Other dorsalgia: Secondary | ICD-10-CM | POA: Diagnosis not present

## 2017-05-02 DIAGNOSIS — M542 Cervicalgia: Secondary | ICD-10-CM | POA: Diagnosis not present

## 2017-05-02 DIAGNOSIS — M25562 Pain in left knee: Secondary | ICD-10-CM | POA: Diagnosis not present

## 2017-05-03 DIAGNOSIS — M542 Cervicalgia: Secondary | ICD-10-CM | POA: Diagnosis not present

## 2017-05-03 DIAGNOSIS — M25562 Pain in left knee: Secondary | ICD-10-CM | POA: Diagnosis not present

## 2017-05-03 DIAGNOSIS — M5489 Other dorsalgia: Secondary | ICD-10-CM | POA: Diagnosis not present

## 2017-05-10 DIAGNOSIS — I2510 Atherosclerotic heart disease of native coronary artery without angina pectoris: Secondary | ICD-10-CM | POA: Diagnosis not present

## 2017-05-10 DIAGNOSIS — I517 Cardiomegaly: Secondary | ICD-10-CM | POA: Diagnosis not present

## 2017-05-10 DIAGNOSIS — I351 Nonrheumatic aortic (valve) insufficiency: Secondary | ICD-10-CM | POA: Diagnosis not present

## 2017-05-16 DIAGNOSIS — I2510 Atherosclerotic heart disease of native coronary artery without angina pectoris: Secondary | ICD-10-CM | POA: Diagnosis not present

## 2017-05-16 DIAGNOSIS — G20 Parkinson's disease: Secondary | ICD-10-CM | POA: Diagnosis not present

## 2017-05-16 DIAGNOSIS — Z955 Presence of coronary angioplasty implant and graft: Secondary | ICD-10-CM | POA: Diagnosis not present

## 2017-05-16 DIAGNOSIS — I10 Essential (primary) hypertension: Secondary | ICD-10-CM | POA: Diagnosis not present

## 2017-05-16 DIAGNOSIS — E782 Mixed hyperlipidemia: Secondary | ICD-10-CM | POA: Diagnosis not present

## 2017-05-23 DIAGNOSIS — M5489 Other dorsalgia: Secondary | ICD-10-CM | POA: Diagnosis not present

## 2017-05-23 DIAGNOSIS — L2389 Allergic contact dermatitis due to other agents: Secondary | ICD-10-CM | POA: Diagnosis not present

## 2017-05-23 DIAGNOSIS — M542 Cervicalgia: Secondary | ICD-10-CM | POA: Diagnosis not present

## 2017-05-23 DIAGNOSIS — M25562 Pain in left knee: Secondary | ICD-10-CM | POA: Diagnosis not present

## 2017-05-23 DIAGNOSIS — H1012 Acute atopic conjunctivitis, left eye: Secondary | ICD-10-CM | POA: Diagnosis not present

## 2017-05-23 DIAGNOSIS — I119 Hypertensive heart disease without heart failure: Secondary | ICD-10-CM | POA: Diagnosis not present

## 2017-05-23 DIAGNOSIS — Z23 Encounter for immunization: Secondary | ICD-10-CM | POA: Diagnosis not present

## 2017-05-25 DIAGNOSIS — M542 Cervicalgia: Secondary | ICD-10-CM | POA: Diagnosis not present

## 2017-06-06 DIAGNOSIS — M5489 Other dorsalgia: Secondary | ICD-10-CM | POA: Diagnosis not present

## 2017-06-06 DIAGNOSIS — M542 Cervicalgia: Secondary | ICD-10-CM | POA: Diagnosis not present

## 2017-06-06 DIAGNOSIS — M25562 Pain in left knee: Secondary | ICD-10-CM | POA: Diagnosis not present

## 2017-07-05 DIAGNOSIS — M542 Cervicalgia: Secondary | ICD-10-CM | POA: Diagnosis not present

## 2017-07-06 DIAGNOSIS — M542 Cervicalgia: Secondary | ICD-10-CM | POA: Diagnosis not present

## 2017-07-06 DIAGNOSIS — M25562 Pain in left knee: Secondary | ICD-10-CM | POA: Diagnosis not present

## 2017-07-06 DIAGNOSIS — M5489 Other dorsalgia: Secondary | ICD-10-CM | POA: Diagnosis not present

## 2017-07-12 DIAGNOSIS — M5489 Other dorsalgia: Secondary | ICD-10-CM | POA: Diagnosis not present

## 2017-07-12 DIAGNOSIS — M25562 Pain in left knee: Secondary | ICD-10-CM | POA: Diagnosis not present

## 2017-07-12 DIAGNOSIS — M542 Cervicalgia: Secondary | ICD-10-CM | POA: Diagnosis not present

## 2017-07-17 DIAGNOSIS — M5489 Other dorsalgia: Secondary | ICD-10-CM | POA: Diagnosis not present

## 2017-07-17 DIAGNOSIS — M25562 Pain in left knee: Secondary | ICD-10-CM | POA: Diagnosis not present

## 2017-07-17 DIAGNOSIS — M542 Cervicalgia: Secondary | ICD-10-CM | POA: Diagnosis not present

## 2017-07-20 DIAGNOSIS — M25562 Pain in left knee: Secondary | ICD-10-CM | POA: Diagnosis not present

## 2017-07-20 DIAGNOSIS — M5489 Other dorsalgia: Secondary | ICD-10-CM | POA: Diagnosis not present

## 2017-07-20 DIAGNOSIS — M542 Cervicalgia: Secondary | ICD-10-CM | POA: Diagnosis not present

## 2017-07-27 DIAGNOSIS — M542 Cervicalgia: Secondary | ICD-10-CM | POA: Diagnosis not present

## 2017-07-27 DIAGNOSIS — M25562 Pain in left knee: Secondary | ICD-10-CM | POA: Diagnosis not present

## 2017-07-27 DIAGNOSIS — M5489 Other dorsalgia: Secondary | ICD-10-CM | POA: Diagnosis not present

## 2017-07-31 DIAGNOSIS — M542 Cervicalgia: Secondary | ICD-10-CM | POA: Diagnosis not present

## 2017-07-31 DIAGNOSIS — M25562 Pain in left knee: Secondary | ICD-10-CM | POA: Diagnosis not present

## 2017-07-31 DIAGNOSIS — M5489 Other dorsalgia: Secondary | ICD-10-CM | POA: Diagnosis not present

## 2017-08-29 ENCOUNTER — Other Ambulatory Visit: Payer: Self-pay

## 2017-08-29 ENCOUNTER — Emergency Department (HOSPITAL_COMMUNITY)
Admission: EM | Admit: 2017-08-29 | Discharge: 2017-08-29 | Disposition: A | Discharge status: Home / Self Care | Payer: Medicare Other | Attending: Emergency Medicine | Admitting: Emergency Medicine

## 2017-08-29 ENCOUNTER — Emergency Department (HOSPITAL_COMMUNITY): Payer: Medicare Other

## 2017-08-29 ENCOUNTER — Encounter (HOSPITAL_COMMUNITY): Payer: Self-pay | Admitting: Emergency Medicine

## 2017-08-29 DIAGNOSIS — G20 Parkinson's disease: Secondary | ICD-10-CM | POA: Diagnosis not present

## 2017-08-29 DIAGNOSIS — Z7901 Long term (current) use of anticoagulants: Secondary | ICD-10-CM | POA: Diagnosis not present

## 2017-08-29 DIAGNOSIS — Z8673 Personal history of transient ischemic attack (TIA), and cerebral infarction without residual deficits: Secondary | ICD-10-CM | POA: Insufficient documentation

## 2017-08-29 DIAGNOSIS — I2583 Coronary atherosclerosis due to lipid rich plaque: Secondary | ICD-10-CM

## 2017-08-29 DIAGNOSIS — I2510 Atherosclerotic heart disease of native coronary artery without angina pectoris: Secondary | ICD-10-CM

## 2017-08-29 DIAGNOSIS — Z7982 Long term (current) use of aspirin: Secondary | ICD-10-CM | POA: Insufficient documentation

## 2017-08-29 DIAGNOSIS — Z79899 Other long term (current) drug therapy: Secondary | ICD-10-CM | POA: Insufficient documentation

## 2017-08-29 DIAGNOSIS — Z85038 Personal history of other malignant neoplasm of large intestine: Secondary | ICD-10-CM | POA: Insufficient documentation

## 2017-08-29 DIAGNOSIS — I10 Essential (primary) hypertension: Secondary | ICD-10-CM | POA: Insufficient documentation

## 2017-08-29 DIAGNOSIS — I208 Other forms of angina pectoris: Secondary | ICD-10-CM | POA: Diagnosis not present

## 2017-08-29 DIAGNOSIS — R079 Chest pain, unspecified: Secondary | ICD-10-CM | POA: Diagnosis not present

## 2017-08-29 LAB — CBC
HCT: 46.8 % (ref 39.0–52.0)
Hemoglobin: 15.9 g/dL (ref 13.0–17.0)
MCH: 32 pg (ref 26.0–34.0)
MCHC: 34 g/dL (ref 30.0–36.0)
MCV: 94.2 fL (ref 78.0–100.0)
Platelets: 174 10*3/uL (ref 150–400)
RBC: 4.97 MIL/uL (ref 4.22–5.81)
RDW: 12.7 % (ref 11.5–15.5)
WBC: 9.3 10*3/uL (ref 4.0–10.5)

## 2017-08-29 LAB — I-STAT TROPONIN, ED
Troponin i, poc: 0 ng/mL (ref 0.00–0.08)
Troponin i, poc: 0 ng/mL (ref 0.00–0.08)

## 2017-08-29 LAB — BASIC METABOLIC PANEL
Anion gap: 13 (ref 5–15)
BUN: 13 mg/dL (ref 6–20)
CO2: 21 mmol/L — ABNORMAL LOW (ref 22–32)
Calcium: 9.2 mg/dL (ref 8.9–10.3)
Chloride: 106 mmol/L (ref 101–111)
Creatinine, Ser: 0.91 mg/dL (ref 0.61–1.24)
GFR calc Af Amer: >60 mL/min (ref >60)
GFR calc non Af Amer: >60 mL/min (ref >60)
Glucose, Bld: 100 mg/dL — ABNORMAL HIGH (ref 65–99)
Potassium: 3.9 mmol/L (ref 3.5–5.1)
Sodium: 140 mmol/L (ref 135–145)

## 2017-08-29 MED ORDER — NITROGLYCERIN 0.4 MG SL SUBL: 0.4000 mg | SUBLINGUAL_TABLET | SUBLINGUAL | 1 refills | Status: DC | PRN

## 2017-08-29 NOTE — Discharge Instructions (Signed)
Continue to take an aspirin daily as well as her other medications.  Take the nitroglycerin as needed for chest discomfort.  Avoid any strenuous exertion.  Contact your cardiologist tomorrow to arrange for further evaluation as we discussed

## 2017-08-29 NOTE — ED Provider Notes (Signed)
[place] [facility] EMERGENCY DEPARTMENT Provider Note   CSN: [id number] Arrival date & time: 08/29/17  1702     History   Chief Complaint Chief Complaint  Patient presents with  . Chest Pain    HPI Shawn Meza is a 72 y.o. male.  HPI Pt has been having trouble with chest pain off and on for the last 2 months.  He feels like when he exerts himself and starts to do some stuff he will get a pressure on his chest like someone is grabbing him.    When he rests the sx will go away.  He also noticed sx sometimes after eating peanuts. Pt had another episode yesterday afternoon and an episode it lasted today.   He was doing physical activity when that occurred today. Pt has history of heart disease.  He has a cardiologist in [place].  He had a stress test a couple of months ago and was told everything was OK but his sx started after that.  Past Medical History:  Diagnosis Date  . Arthritis   . BPH (benign prostatic hypertrophy)   . CAD (coronary artery disease)   . Colon cancer ([place])   . GERD (gastroesophageal reflux disease)   . Headache(784.0)   . Hyperlipidemia   . Hypertension   . Medial meniscus tear 10/11/2011  . Nephrolithiasis    hx of  . Transient ischemic attack    hx of    Patient Active Problem List   Diagnosis Date Noted  . Parkinson's disease ([place]) 01/02/2017  . Memory change 07/06/2015  . Depression 07/06/2015  . Medial meniscus tear 10/11/2011  . Neuroma of foot 10/11/2011  . CAD (coronary artery disease) 12/28/2010  . 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 HYPERLIPIDEMIA 05/20/2010  . HYPERTENSION 05/20/2010  . 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 GERD 05/20/2010  . TRANSIENT ISCHEMIC ATTACK, HX OF 05/20/2010  . NEPHROLITHIASIS, HX OF 05/20/2010  . BENIGN PROSTATIC HYPERTROPHY, HX OF, S/P TURP 05/20/2010    Past Surgical History:  Procedure Laterality Date  . CARDIAC CATHETERIZATION  5/12,1/13  . COLON SURGERY     . KNEE ARTHROSCOPY  10/11/2011   Procedure: ARTHROSCOPY KNEE;  Surgeon: Lorn Junes, MD;  Location: [facility];  Service: Orthopedics;  Laterality: Left;  Left Knee Arthroscopy with Medial and Lateral Partial Menisectomy, Chondroplasty  . LEFT HEART CATHETERIZATION WITH CORONARY ANGIOGRAM N/A 08/25/2011   Procedure: LEFT HEART CATHETERIZATION WITH CORONARY ANGIOGRAM;  Surgeon: Burnell Blanks, MD;  Location: [facility] CATH LAB;  Service: Cardiovascular;  Laterality: N/A;  . STERIOD INJECTION  10/11/2011   Procedure: STEROID INJECTION;  Surgeon: Lorn Junes, MD;  Location: [place];  Service: Orthopedics;  Laterality: Right;  Steroid Injection Second Toe  . TRANSURETHRAL RESECTION OF PROSTATE         Home Medications    Prior to Admission medications   Medication Sig Start Date End Date Taking? Authorizing Provider  aspirin 81 MG tablet Take 81-162 mg by mouth daily.    Yes [provider]  atorvastatin (LIPITOR) 20 MG tablet Take 1 tablet (20 mg total) by mouth daily. Patient taking differently: Take 20 mg by mouth at bedtime.  06/14/16  Yes Copland, Gay Filler, MD  clopidogrel (PLAVIX) 75 MG tablet TAKE 1 TABLET (75 MG TOTAL) BY MOUTH ONCE DAILY. Patient taking differently: Take 75 mg by mouth daily.  05/20/15  Yes Copland, Gay Filler, MD  fluocinonide-emollient (LIDEX-E)  0.05 % cream Apply 1 application topically 2 (two) times daily. Patient taking differently: Apply 1 application topically 2 (two) times daily as needed (for itching).  07/31/14  Yes Robyn Haber, MD  meloxicam (MOBIC) 7.5 MG tablet TAKE 1 TABLET BY MOUTH ONCE DAILY AS NEEDED Patient taking differently: Take 7.5 mg by mouth once a day 10/27/15  Yes Copland, Gay Filler, MD  metoprolol tartrate (LOPRESSOR) 25 MG tablet TAKE 1/2 TABLET BY MOUTH TWICE DAILY Patient taking differently: Take 25 mg by mouth at bedtime 07/31/15  Yes Copland, Gay Filler, MD  olopatadine (PATANOL) 0.1  % ophthalmic solution Place 1 drop into both eyes daily as needed for irritation. 05/23/17  Yes [provider]  tamsulosin (FLOMAX) 0.4 MG CAPS capsule Take 0.4 mg by mouth daily as needed (to relax the prostate).  11/16/15  Yes [provider]  triamcinolone cream (KENALOG) 0.1 % Apply 1 application topically daily as needed (for itching of affected areas).  05/23/17  Yes [provider]  carbidopa-levodopa (SINEMET IR) 25-100 MG tablet Take 0.5 tablets by mouth 3 (three) times daily. Patient not taking: Reported on 08/29/2017 12/12/16   Melvenia Beam, MD  erythromycin ophthalmic ointment Place 1 application into the right eye 3 (three) times daily. Put a thin strip on the lower eyelid 3 times per day. Patient not taking: Reported on 08/29/2017 12/05/15   Maryan Puls, MD  esomeprazole (NEXIUM) 40 MG capsule Take 1 capsule (40 mg total) by mouth daily before breakfast. Patient not taking: Reported on 08/29/2017 06/13/13   Robyn Haber, MD  nitroGLYCERIN (NITROSTAT) 0.4 MG SL tablet Place 1 tablet (0.4 mg total) under the tongue every 5 (five) minutes as needed. Chest pain 08/29/17   Dorie Rank, MD  rOPINIRole (REQUIP) 0.25 MG tablet Take 1 tablet (0.25 mg total) by mouth 3 (three) times daily for one week. Then take 2 tablets (0.50 mg total) by mouth 3 (three) times daily for one week. Then take 3 tablets (0.75 mg total) by mouth 3 (three) times daily for one week. Then take 4 tablets (1 mg total) by mouth 3 (three) times daily. Patient not taking: Reported on 08/29/2017 01/02/17   Melvenia Beam, MD    Family History Family History  Problem Relation Age of Onset  . Heart disease Mother   . Heart disease Father   . Hyperlipidemia Father   . Stroke Father   . Hyperlipidemia Brother   . Heart disease Brother   . Coronary artery disease Other        family hx of male 1st degree relative ,44  . Hyperlipidemia Unknown        family hx of  . Hypertension Unknown         family hx of  . Arthritis Unknown        family hx of  . Dementia Neg Hx     Social History Social History   Tobacco Use  . Smoking status: Never Smoker  . Smokeless tobacco: Never Used  Substance Use Topics  . Alcohol use: No  . Drug use: No     Allergies   Testosterone; Fluoxetine; Other; and Requip [ropinirole]   Review of Systems Review of Systems  All other systems reviewed and are negative.    Physical Exam Updated Vital Signs BP (!) 147/63   Pulse 62   Temp 99.7 F (37.6 C) (Oral)   Resp 14   Ht 1.676 m (5\' 6" )   Wt 99.8 kg (  220 lb)   SpO2 93%   BMI 35.51 kg/m   Physical Exam  Constitutional: He appears well-developed and well-nourished. No distress.  HENT:  Head: Normocephalic and atraumatic.  Right Ear: External ear normal.  Left Ear: External ear normal.  Eyes: Conjunctivae are normal. Right eye exhibits no discharge. Left eye exhibits no discharge. No scleral icterus.  Neck: Neck supple. No tracheal deviation present.  Cardiovascular: Normal rate, regular rhythm and intact distal pulses.  Pulmonary/Chest: Effort normal and breath sounds normal. No stridor. No respiratory distress. He has no wheezes. He has no rales.  Abdominal: Soft. Bowel sounds are normal. He exhibits no distension. There is no tenderness. There is no rebound and no guarding.  Musculoskeletal: He exhibits no edema or tenderness.  Neurological: He is alert. He has normal strength. No cranial nerve deficit (no facial droop, extraocular movements intact, no slurred speech) or sensory deficit. He exhibits normal muscle tone. He displays no seizure activity. Coordination normal.  Skin: Skin is warm and dry. No rash noted.  Psychiatric: He has a normal mood and affect.  Nursing note and vitals reviewed.    ED Treatments / Results  Labs (all labs ordered are listed, but only abnormal results are displayed) Labs Reviewed  BASIC METABOLIC PANEL - Abnormal; Notable for the  following components:      Result Value   CO2 21 (*)    Glucose, Bld 100 (*)    All other components within normal limits  CBC  I-STAT TROPONIN, ED  I-STAT TROPONIN, ED    EKG  EKG Interpretation  Date/Time:  Tuesday August 29 2017 17:11:58 EST Ventricular Rate:  70 PR Interval:  182 QRS Duration: 90 QT Interval:  388 QTC Calculation: 419 R Axis:   -10 Text Interpretation:  Sinus rhythm with occasional Premature ventricular complexes Possible Anterior infarct , age undetermined Abnormal ECG Premature ventricular complexes are new, otherwise no significant change Confirmed by Dorie Rank [phone]) on 08/29/2017 10:03:08 PM       Radiology Dg Chest 2 View  Result Date: 08/29/2017 CLINICAL DATA:  Pt c/o central chest pain off and on x's 2 months. Pt st's he can't do anything without feeling a tightness in his central chest. Pt denies any chest pain at this timeHx of CAD, HTN, TIA EXAM: CHEST  2 VIEW COMPARISON:  10/28/2015 FINDINGS: The cardiac silhouette is normal in size and configuration. Normal mediastinal and hilar contours. Clear lungs.  No pleural effusion or pneumothorax. Skeletal structures are intact. IMPRESSION: No active cardiopulmonary disease. Electronically Signed   By: Lajean Manes M.D.   On: 08/29/2017 18:08    Procedures Procedures (including critical care time)  Medications Ordered in ED Medications - No data to display   Initial Impression / Assessment and Plan / ED Course  I have reviewed the triage vital signs and the nursing notes.  Pertinent labs & imaging results that were available during my care of the patient were reviewed by me and considered in my medical decision making (see chart for details).  Clinical Course as of Aug 29 2302  Tue Aug 29, 2017  2303 Laboratory tests are reassuring.  [JK]    Clinical Course User Index [JK] Dorie Rank, MD  Patient presents to the emergency room for evaluation of chest pain.  Patient's been having symptoms  for the last couple of months.  Patient symptoms are concerning for stable exertional angina.  He does have a cardiologist at [facility].  Medical records indicate he  had a negative stress test in October of last year.  I do not think the patient needs to be admitted to the hospital tonight however he should follow-up with his cardiologist closely.  He may need cardiac catheterization considering his persistent symptoms despite a negative stress test.  Final Clinical Impressions(s) / ED Diagnoses   Final diagnoses:  Exercise-induced angina [facility])    ED Discharge Orders        Ordered    nitroGLYCERIN (NITROSTAT) 0.4 MG SL tablet  Every 5 min PRN     08/29/17 2301       Dorie Rank, MD 08/29/17 2304

## 2017-08-29 NOTE — ED Notes (Signed)
IV line not placed.  Do not start per Dr. Tomi Bamberger, potential DC home, 2nd Trop negative.

## 2017-08-29 NOTE — ED Triage Notes (Signed)
Pt c/o central chest pain off and on x's 2 months.  Pt st's he can't do anything without feeling a tightness in his central chest.  Pt denies any chest pain at this time

## 2017-09-13 DIAGNOSIS — K219 Gastro-esophageal reflux disease without esophagitis: Secondary | ICD-10-CM | POA: Diagnosis not present

## 2017-09-13 DIAGNOSIS — R9431 Abnormal electrocardiogram [ECG] [EKG]: Secondary | ICD-10-CM | POA: Diagnosis not present

## 2017-09-13 DIAGNOSIS — I2510 Atherosclerotic heart disease of native coronary artery without angina pectoris: Secondary | ICD-10-CM | POA: Diagnosis not present

## 2017-09-13 DIAGNOSIS — R079 Chest pain, unspecified: Secondary | ICD-10-CM | POA: Diagnosis not present

## 2017-09-14 DIAGNOSIS — E782 Mixed hyperlipidemia: Secondary | ICD-10-CM | POA: Diagnosis not present

## 2017-09-14 DIAGNOSIS — I2510 Atherosclerotic heart disease of native coronary artery without angina pectoris: Secondary | ICD-10-CM | POA: Diagnosis not present

## 2017-09-14 DIAGNOSIS — I10 Essential (primary) hypertension: Secondary | ICD-10-CM | POA: Diagnosis not present

## 2017-09-14 DIAGNOSIS — I4892 Unspecified atrial flutter: Secondary | ICD-10-CM | POA: Diagnosis not present

## 2017-09-21 DIAGNOSIS — I493 Ventricular premature depolarization: Secondary | ICD-10-CM | POA: Diagnosis not present

## 2017-10-11 DIAGNOSIS — R079 Chest pain, unspecified: Secondary | ICD-10-CM | POA: Insufficient documentation

## 2017-10-11 HISTORY — DX: Chest pain, unspecified: R07.9

## 2017-10-12 DIAGNOSIS — G20 Parkinson's disease: Secondary | ICD-10-CM | POA: Diagnosis not present

## 2017-10-12 DIAGNOSIS — K219 Gastro-esophageal reflux disease without esophagitis: Secondary | ICD-10-CM | POA: Diagnosis not present

## 2017-10-12 DIAGNOSIS — K449 Diaphragmatic hernia without obstruction or gangrene: Secondary | ICD-10-CM | POA: Diagnosis not present

## 2017-10-17 DIAGNOSIS — K449 Diaphragmatic hernia without obstruction or gangrene: Secondary | ICD-10-CM | POA: Diagnosis not present

## 2017-10-17 DIAGNOSIS — G20 Parkinson's disease: Secondary | ICD-10-CM | POA: Diagnosis not present

## 2017-10-17 DIAGNOSIS — I472 Ventricular tachycardia: Secondary | ICD-10-CM | POA: Diagnosis not present

## 2017-10-17 DIAGNOSIS — I10 Essential (primary) hypertension: Secondary | ICD-10-CM | POA: Diagnosis not present

## 2017-10-17 DIAGNOSIS — K219 Gastro-esophageal reflux disease without esophagitis: Secondary | ICD-10-CM | POA: Diagnosis not present

## 2017-10-17 DIAGNOSIS — G8912 Acute post-thoracotomy pain: Secondary | ICD-10-CM | POA: Diagnosis not present

## 2017-10-17 DIAGNOSIS — I9789 Other postprocedural complications and disorders of the circulatory system, not elsewhere classified: Secondary | ICD-10-CM | POA: Diagnosis not present

## 2017-10-17 DIAGNOSIS — R570 Cardiogenic shock: Secondary | ICD-10-CM | POA: Diagnosis not present

## 2017-10-17 DIAGNOSIS — I25118 Atherosclerotic heart disease of native coronary artery with other forms of angina pectoris: Secondary | ICD-10-CM | POA: Diagnosis not present

## 2017-10-17 DIAGNOSIS — Z7982 Long term (current) use of aspirin: Secondary | ICD-10-CM | POA: Diagnosis not present

## 2017-10-17 DIAGNOSIS — I517 Cardiomegaly: Secondary | ICD-10-CM | POA: Diagnosis not present

## 2017-10-17 DIAGNOSIS — Z951 Presence of aortocoronary bypass graft: Secondary | ICD-10-CM | POA: Diagnosis not present

## 2017-10-17 DIAGNOSIS — E785 Hyperlipidemia, unspecified: Secondary | ICD-10-CM

## 2017-10-17 DIAGNOSIS — I447 Left bundle-branch block, unspecified: Secondary | ICD-10-CM | POA: Diagnosis not present

## 2017-10-17 DIAGNOSIS — E876 Hypokalemia: Secondary | ICD-10-CM | POA: Diagnosis not present

## 2017-10-17 DIAGNOSIS — I082 Rheumatic disorders of both aortic and tricuspid valves: Secondary | ICD-10-CM | POA: Diagnosis not present

## 2017-10-17 DIAGNOSIS — I2510 Atherosclerotic heart disease of native coronary artery without angina pectoris: Secondary | ICD-10-CM | POA: Diagnosis not present

## 2017-10-17 DIAGNOSIS — I4510 Unspecified right bundle-branch block: Secondary | ICD-10-CM | POA: Diagnosis not present

## 2017-10-17 DIAGNOSIS — T8110XA Postprocedural shock unspecified, initial encounter: Secondary | ICD-10-CM | POA: Diagnosis not present

## 2017-10-17 DIAGNOSIS — N320 Bladder-neck obstruction: Secondary | ICD-10-CM | POA: Diagnosis not present

## 2017-10-17 DIAGNOSIS — R278 Other lack of coordination: Secondary | ICD-10-CM | POA: Diagnosis not present

## 2017-10-17 DIAGNOSIS — J9811 Atelectasis: Secondary | ICD-10-CM | POA: Diagnosis not present

## 2017-10-17 DIAGNOSIS — I2109 ST elevation (STEMI) myocardial infarction involving other coronary artery of anterior wall: Secondary | ICD-10-CM | POA: Diagnosis not present

## 2017-10-17 DIAGNOSIS — Z7901 Long term (current) use of anticoagulants: Secondary | ICD-10-CM | POA: Diagnosis not present

## 2017-10-17 DIAGNOSIS — J90 Pleural effusion, not elsewhere classified: Secondary | ICD-10-CM | POA: Diagnosis not present

## 2017-10-17 DIAGNOSIS — I5030 Unspecified diastolic (congestive) heart failure: Secondary | ICD-10-CM | POA: Diagnosis not present

## 2017-10-17 DIAGNOSIS — Z955 Presence of coronary angioplasty implant and graft: Secondary | ICD-10-CM | POA: Diagnosis not present

## 2017-10-17 DIAGNOSIS — I493 Ventricular premature depolarization: Secondary | ICD-10-CM | POA: Diagnosis not present

## 2017-10-17 DIAGNOSIS — I509 Heart failure, unspecified: Secondary | ICD-10-CM | POA: Diagnosis not present

## 2017-10-17 DIAGNOSIS — J951 Acute pulmonary insufficiency following thoracic surgery: Secondary | ICD-10-CM | POA: Diagnosis not present

## 2017-10-17 DIAGNOSIS — I5023 Acute on chronic systolic (congestive) heart failure: Secondary | ICD-10-CM | POA: Diagnosis not present

## 2017-10-17 DIAGNOSIS — I459 Conduction disorder, unspecified: Secondary | ICD-10-CM | POA: Diagnosis not present

## 2017-10-17 DIAGNOSIS — I080 Rheumatic disorders of both mitral and aortic valves: Secondary | ICD-10-CM | POA: Diagnosis not present

## 2017-10-17 DIAGNOSIS — I5020 Unspecified systolic (congestive) heart failure: Secondary | ICD-10-CM | POA: Diagnosis not present

## 2017-10-17 DIAGNOSIS — Z9911 Dependence on respirator [ventilator] status: Secondary | ICD-10-CM | POA: Diagnosis not present

## 2017-10-17 DIAGNOSIS — R079 Chest pain, unspecified: Secondary | ICD-10-CM | POA: Diagnosis not present

## 2017-10-17 DIAGNOSIS — I97190 Other postprocedural cardiac functional disturbances following cardiac surgery: Secondary | ICD-10-CM | POA: Diagnosis not present

## 2017-10-17 DIAGNOSIS — R0789 Other chest pain: Secondary | ICD-10-CM | POA: Diagnosis not present

## 2017-10-17 DIAGNOSIS — R2689 Other abnormalities of gait and mobility: Secondary | ICD-10-CM | POA: Diagnosis not present

## 2017-10-17 DIAGNOSIS — R918 Other nonspecific abnormal finding of lung field: Secondary | ICD-10-CM | POA: Diagnosis not present

## 2017-10-17 DIAGNOSIS — Z4682 Encounter for fitting and adjustment of non-vascular catheter: Secondary | ICD-10-CM | POA: Diagnosis not present

## 2017-10-17 DIAGNOSIS — D649 Anemia, unspecified: Secondary | ICD-10-CM | POA: Diagnosis not present

## 2017-10-17 DIAGNOSIS — I25110 Atherosclerotic heart disease of native coronary artery with unstable angina pectoris: Secondary | ICD-10-CM | POA: Diagnosis not present

## 2017-10-17 DIAGNOSIS — Z01818 Encounter for other preprocedural examination: Secondary | ICD-10-CM | POA: Diagnosis not present

## 2017-10-17 DIAGNOSIS — I252 Old myocardial infarction: Secondary | ICD-10-CM | POA: Diagnosis not present

## 2017-10-17 DIAGNOSIS — E7849 Other hyperlipidemia: Secondary | ICD-10-CM | POA: Diagnosis not present

## 2017-10-17 DIAGNOSIS — I440 Atrioventricular block, first degree: Secondary | ICD-10-CM | POA: Diagnosis not present

## 2017-10-17 DIAGNOSIS — K760 Fatty (change of) liver, not elsewhere classified: Secondary | ICD-10-CM | POA: Diagnosis not present

## 2017-10-17 DIAGNOSIS — R05 Cough: Secondary | ICD-10-CM | POA: Diagnosis not present

## 2017-10-17 DIAGNOSIS — I214 Non-ST elevation (NSTEMI) myocardial infarction: Secondary | ICD-10-CM | POA: Diagnosis not present

## 2017-10-17 DIAGNOSIS — M6281 Muscle weakness (generalized): Secondary | ICD-10-CM | POA: Diagnosis not present

## 2017-10-17 DIAGNOSIS — I110 Hypertensive heart disease with heart failure: Secondary | ICD-10-CM | POA: Diagnosis not present

## 2017-10-17 DIAGNOSIS — R778 Other specified abnormalities of plasma proteins: Secondary | ICD-10-CM | POA: Diagnosis not present

## 2017-10-17 DIAGNOSIS — E782 Mixed hyperlipidemia: Secondary | ICD-10-CM | POA: Diagnosis not present

## 2017-10-17 DIAGNOSIS — Z452 Encounter for adjustment and management of vascular access device: Secondary | ICD-10-CM | POA: Diagnosis not present

## 2017-10-17 DIAGNOSIS — I6523 Occlusion and stenosis of bilateral carotid arteries: Secondary | ICD-10-CM | POA: Diagnosis not present

## 2017-10-17 DIAGNOSIS — I213 ST elevation (STEMI) myocardial infarction of unspecified site: Secondary | ICD-10-CM | POA: Diagnosis not present

## 2017-10-17 DIAGNOSIS — D62 Acute posthemorrhagic anemia: Secondary | ICD-10-CM | POA: Diagnosis not present

## 2017-10-17 DIAGNOSIS — R579 Shock, unspecified: Secondary | ICD-10-CM | POA: Diagnosis not present

## 2017-10-17 DIAGNOSIS — I499 Cardiac arrhythmia, unspecified: Secondary | ICD-10-CM | POA: Diagnosis not present

## 2017-10-17 DIAGNOSIS — N179 Acute kidney failure, unspecified: Secondary | ICD-10-CM | POA: Diagnosis not present

## 2017-10-17 HISTORY — DX: Hyperlipidemia, unspecified: E78.5

## 2017-10-25 DIAGNOSIS — J9589 Other postprocedural complications and disorders of respiratory system, not elsewhere classified: Secondary | ICD-10-CM | POA: Insufficient documentation

## 2017-10-25 DIAGNOSIS — F05 Delirium due to known physiological condition: Secondary | ICD-10-CM | POA: Insufficient documentation

## 2017-10-25 DIAGNOSIS — D62 Acute posthemorrhagic anemia: Secondary | ICD-10-CM | POA: Insufficient documentation

## 2017-10-25 HISTORY — DX: Delirium due to known physiological condition: F05

## 2017-10-25 HISTORY — DX: Other postprocedural complications and disorders of respiratory system, not elsewhere classified: J95.89

## 2017-10-25 HISTORY — DX: Acute posthemorrhagic anemia: D62

## 2017-11-06 DIAGNOSIS — R2689 Other abnormalities of gait and mobility: Secondary | ICD-10-CM | POA: Diagnosis not present

## 2017-11-06 DIAGNOSIS — I499 Cardiac arrhythmia, unspecified: Secondary | ICD-10-CM | POA: Diagnosis not present

## 2017-11-06 DIAGNOSIS — R278 Other lack of coordination: Secondary | ICD-10-CM | POA: Diagnosis not present

## 2017-11-06 DIAGNOSIS — I10 Essential (primary) hypertension: Secondary | ICD-10-CM | POA: Diagnosis not present

## 2017-11-06 DIAGNOSIS — I25110 Atherosclerotic heart disease of native coronary artery with unstable angina pectoris: Secondary | ICD-10-CM | POA: Diagnosis not present

## 2017-11-06 DIAGNOSIS — Z951 Presence of aortocoronary bypass graft: Secondary | ICD-10-CM | POA: Diagnosis not present

## 2017-11-06 DIAGNOSIS — G20 Parkinson's disease: Secondary | ICD-10-CM | POA: Diagnosis not present

## 2017-11-06 DIAGNOSIS — D649 Anemia, unspecified: Secondary | ICD-10-CM | POA: Diagnosis not present

## 2017-11-06 DIAGNOSIS — M6281 Muscle weakness (generalized): Secondary | ICD-10-CM | POA: Diagnosis not present

## 2017-11-06 DIAGNOSIS — E785 Hyperlipidemia, unspecified: Secondary | ICD-10-CM | POA: Diagnosis not present

## 2017-11-06 DIAGNOSIS — I214 Non-ST elevation (NSTEMI) myocardial infarction: Secondary | ICD-10-CM | POA: Diagnosis not present

## 2017-11-06 DIAGNOSIS — E782 Mixed hyperlipidemia: Secondary | ICD-10-CM | POA: Diagnosis not present

## 2017-11-08 DIAGNOSIS — I10 Essential (primary) hypertension: Secondary | ICD-10-CM | POA: Diagnosis not present

## 2017-11-08 DIAGNOSIS — E785 Hyperlipidemia, unspecified: Secondary | ICD-10-CM | POA: Diagnosis not present

## 2017-11-08 DIAGNOSIS — Z951 Presence of aortocoronary bypass graft: Secondary | ICD-10-CM | POA: Diagnosis not present

## 2017-11-08 DIAGNOSIS — G20 Parkinson's disease: Secondary | ICD-10-CM | POA: Diagnosis not present

## 2017-11-23 DIAGNOSIS — Z9189 Other specified personal risk factors, not elsewhere classified: Secondary | ICD-10-CM | POA: Diagnosis not present

## 2017-11-23 DIAGNOSIS — Z951 Presence of aortocoronary bypass graft: Secondary | ICD-10-CM | POA: Diagnosis not present

## 2017-11-23 DIAGNOSIS — I119 Hypertensive heart disease without heart failure: Secondary | ICD-10-CM | POA: Diagnosis not present

## 2017-11-24 DIAGNOSIS — Z7902 Long term (current) use of antithrombotics/antiplatelets: Secondary | ICD-10-CM | POA: Diagnosis not present

## 2017-11-24 DIAGNOSIS — I10 Essential (primary) hypertension: Secondary | ICD-10-CM | POA: Diagnosis not present

## 2017-11-24 DIAGNOSIS — Z7982 Long term (current) use of aspirin: Secondary | ICD-10-CM | POA: Diagnosis not present

## 2017-11-24 DIAGNOSIS — I214 Non-ST elevation (NSTEMI) myocardial infarction: Secondary | ICD-10-CM | POA: Diagnosis not present

## 2017-11-24 DIAGNOSIS — I2510 Atherosclerotic heart disease of native coronary artery without angina pectoris: Secondary | ICD-10-CM | POA: Diagnosis not present

## 2017-11-24 DIAGNOSIS — G20 Parkinson's disease: Secondary | ICD-10-CM | POA: Diagnosis not present

## 2017-11-24 DIAGNOSIS — E785 Hyperlipidemia, unspecified: Secondary | ICD-10-CM | POA: Diagnosis not present

## 2017-11-24 DIAGNOSIS — K449 Diaphragmatic hernia without obstruction or gangrene: Secondary | ICD-10-CM | POA: Diagnosis not present

## 2017-11-24 DIAGNOSIS — Z951 Presence of aortocoronary bypass graft: Secondary | ICD-10-CM | POA: Diagnosis not present

## 2017-11-24 DIAGNOSIS — Z48812 Encounter for surgical aftercare following surgery on the circulatory system: Secondary | ICD-10-CM | POA: Diagnosis not present

## 2017-11-24 DIAGNOSIS — D649 Anemia, unspecified: Secondary | ICD-10-CM | POA: Diagnosis not present

## 2017-12-01 DIAGNOSIS — E785 Hyperlipidemia, unspecified: Secondary | ICD-10-CM | POA: Diagnosis not present

## 2017-12-01 DIAGNOSIS — I214 Non-ST elevation (NSTEMI) myocardial infarction: Secondary | ICD-10-CM | POA: Diagnosis not present

## 2017-12-01 DIAGNOSIS — I2510 Atherosclerotic heart disease of native coronary artery without angina pectoris: Secondary | ICD-10-CM | POA: Diagnosis not present

## 2017-12-01 DIAGNOSIS — I10 Essential (primary) hypertension: Secondary | ICD-10-CM | POA: Diagnosis not present

## 2017-12-01 DIAGNOSIS — K449 Diaphragmatic hernia without obstruction or gangrene: Secondary | ICD-10-CM | POA: Diagnosis not present

## 2017-12-01 DIAGNOSIS — D649 Anemia, unspecified: Secondary | ICD-10-CM | POA: Diagnosis not present

## 2017-12-01 DIAGNOSIS — Z48812 Encounter for surgical aftercare following surgery on the circulatory system: Secondary | ICD-10-CM | POA: Diagnosis not present

## 2017-12-01 DIAGNOSIS — Z951 Presence of aortocoronary bypass graft: Secondary | ICD-10-CM | POA: Diagnosis not present

## 2017-12-01 DIAGNOSIS — Z7982 Long term (current) use of aspirin: Secondary | ICD-10-CM | POA: Diagnosis not present

## 2017-12-01 DIAGNOSIS — G20 Parkinson's disease: Secondary | ICD-10-CM | POA: Diagnosis not present

## 2017-12-01 DIAGNOSIS — Z7902 Long term (current) use of antithrombotics/antiplatelets: Secondary | ICD-10-CM | POA: Diagnosis not present

## 2017-12-04 DIAGNOSIS — Z951 Presence of aortocoronary bypass graft: Secondary | ICD-10-CM | POA: Diagnosis not present

## 2017-12-04 DIAGNOSIS — I214 Non-ST elevation (NSTEMI) myocardial infarction: Secondary | ICD-10-CM | POA: Diagnosis not present

## 2017-12-04 DIAGNOSIS — D649 Anemia, unspecified: Secondary | ICD-10-CM | POA: Diagnosis not present

## 2017-12-04 DIAGNOSIS — K449 Diaphragmatic hernia without obstruction or gangrene: Secondary | ICD-10-CM | POA: Diagnosis not present

## 2017-12-04 DIAGNOSIS — Z7982 Long term (current) use of aspirin: Secondary | ICD-10-CM | POA: Diagnosis not present

## 2017-12-04 DIAGNOSIS — E785 Hyperlipidemia, unspecified: Secondary | ICD-10-CM | POA: Diagnosis not present

## 2017-12-04 DIAGNOSIS — Z7902 Long term (current) use of antithrombotics/antiplatelets: Secondary | ICD-10-CM | POA: Diagnosis not present

## 2017-12-04 DIAGNOSIS — G20 Parkinson's disease: Secondary | ICD-10-CM | POA: Diagnosis not present

## 2017-12-04 DIAGNOSIS — I10 Essential (primary) hypertension: Secondary | ICD-10-CM | POA: Diagnosis not present

## 2017-12-04 DIAGNOSIS — Z48812 Encounter for surgical aftercare following surgery on the circulatory system: Secondary | ICD-10-CM | POA: Diagnosis not present

## 2017-12-04 DIAGNOSIS — I2510 Atherosclerotic heart disease of native coronary artery without angina pectoris: Secondary | ICD-10-CM | POA: Diagnosis not present

## 2017-12-08 DIAGNOSIS — I10 Essential (primary) hypertension: Secondary | ICD-10-CM | POA: Diagnosis not present

## 2017-12-08 DIAGNOSIS — Z7982 Long term (current) use of aspirin: Secondary | ICD-10-CM | POA: Diagnosis not present

## 2017-12-08 DIAGNOSIS — Z7902 Long term (current) use of antithrombotics/antiplatelets: Secondary | ICD-10-CM | POA: Diagnosis not present

## 2017-12-08 DIAGNOSIS — D649 Anemia, unspecified: Secondary | ICD-10-CM | POA: Diagnosis not present

## 2017-12-08 DIAGNOSIS — I214 Non-ST elevation (NSTEMI) myocardial infarction: Secondary | ICD-10-CM | POA: Diagnosis not present

## 2017-12-08 DIAGNOSIS — Z951 Presence of aortocoronary bypass graft: Secondary | ICD-10-CM | POA: Diagnosis not present

## 2017-12-08 DIAGNOSIS — Z48812 Encounter for surgical aftercare following surgery on the circulatory system: Secondary | ICD-10-CM | POA: Diagnosis not present

## 2017-12-08 DIAGNOSIS — G20 Parkinson's disease: Secondary | ICD-10-CM | POA: Diagnosis not present

## 2017-12-08 DIAGNOSIS — E785 Hyperlipidemia, unspecified: Secondary | ICD-10-CM | POA: Diagnosis not present

## 2017-12-08 DIAGNOSIS — I2510 Atherosclerotic heart disease of native coronary artery without angina pectoris: Secondary | ICD-10-CM | POA: Diagnosis not present

## 2017-12-08 DIAGNOSIS — K449 Diaphragmatic hernia without obstruction or gangrene: Secondary | ICD-10-CM | POA: Diagnosis not present

## 2017-12-12 ENCOUNTER — Encounter: Payer: Self-pay | Admitting: Cardiology

## 2017-12-12 DIAGNOSIS — Z79899 Other long term (current) drug therapy: Secondary | ICD-10-CM | POA: Diagnosis not present

## 2017-12-12 DIAGNOSIS — D229 Melanocytic nevi, unspecified: Secondary | ICD-10-CM | POA: Diagnosis not present

## 2017-12-12 DIAGNOSIS — L02411 Cutaneous abscess of right axilla: Secondary | ICD-10-CM

## 2017-12-12 DIAGNOSIS — Z951 Presence of aortocoronary bypass graft: Secondary | ICD-10-CM | POA: Diagnosis not present

## 2017-12-12 DIAGNOSIS — Z6833 Body mass index (BMI) 33.0-33.9, adult: Secondary | ICD-10-CM

## 2017-12-12 DIAGNOSIS — R5383 Other fatigue: Secondary | ICD-10-CM | POA: Diagnosis not present

## 2017-12-12 DIAGNOSIS — K219 Gastro-esophageal reflux disease without esophagitis: Secondary | ICD-10-CM | POA: Diagnosis not present

## 2017-12-12 HISTORY — DX: Cutaneous abscess of right axilla: L02.411

## 2017-12-12 HISTORY — DX: Body mass index (BMI) 33.0-33.9, adult: Z68.33

## 2017-12-15 DIAGNOSIS — Z48812 Encounter for surgical aftercare following surgery on the circulatory system: Secondary | ICD-10-CM | POA: Diagnosis not present

## 2017-12-15 DIAGNOSIS — I214 Non-ST elevation (NSTEMI) myocardial infarction: Secondary | ICD-10-CM | POA: Diagnosis not present

## 2017-12-15 DIAGNOSIS — G20 Parkinson's disease: Secondary | ICD-10-CM | POA: Diagnosis not present

## 2017-12-15 DIAGNOSIS — E785 Hyperlipidemia, unspecified: Secondary | ICD-10-CM | POA: Diagnosis not present

## 2017-12-15 DIAGNOSIS — Z7982 Long term (current) use of aspirin: Secondary | ICD-10-CM | POA: Diagnosis not present

## 2017-12-15 DIAGNOSIS — I10 Essential (primary) hypertension: Secondary | ICD-10-CM | POA: Diagnosis not present

## 2017-12-15 DIAGNOSIS — Z951 Presence of aortocoronary bypass graft: Secondary | ICD-10-CM | POA: Diagnosis not present

## 2017-12-15 DIAGNOSIS — D649 Anemia, unspecified: Secondary | ICD-10-CM | POA: Diagnosis not present

## 2017-12-15 DIAGNOSIS — K449 Diaphragmatic hernia without obstruction or gangrene: Secondary | ICD-10-CM | POA: Diagnosis not present

## 2017-12-15 DIAGNOSIS — I2510 Atherosclerotic heart disease of native coronary artery without angina pectoris: Secondary | ICD-10-CM | POA: Diagnosis not present

## 2017-12-15 DIAGNOSIS — Z7902 Long term (current) use of antithrombotics/antiplatelets: Secondary | ICD-10-CM | POA: Diagnosis not present

## 2017-12-18 DIAGNOSIS — L304 Erythema intertrigo: Secondary | ICD-10-CM | POA: Diagnosis not present

## 2017-12-18 DIAGNOSIS — L820 Inflamed seborrheic keratosis: Secondary | ICD-10-CM | POA: Diagnosis not present

## 2017-12-21 ENCOUNTER — Encounter: Payer: Self-pay | Admitting: Cardiology

## 2017-12-21 ENCOUNTER — Ambulatory Visit (INDEPENDENT_AMBULATORY_CARE_PROVIDER_SITE_OTHER): Payer: Medicare Other | Admitting: Cardiology

## 2017-12-21 VITALS — BP 118/62 | HR 84 | Ht 66.0 in | Wt 206.0 lb

## 2017-12-21 DIAGNOSIS — Z6833 Body mass index (BMI) 33.0-33.9, adult: Secondary | ICD-10-CM | POA: Diagnosis not present

## 2017-12-21 DIAGNOSIS — I10 Essential (primary) hypertension: Secondary | ICD-10-CM

## 2017-12-21 DIAGNOSIS — I2583 Coronary atherosclerosis due to lipid rich plaque: Secondary | ICD-10-CM

## 2017-12-21 DIAGNOSIS — I2510 Atherosclerotic heart disease of native coronary artery without angina pectoris: Secondary | ICD-10-CM | POA: Diagnosis not present

## 2017-12-21 DIAGNOSIS — Z951 Presence of aortocoronary bypass graft: Secondary | ICD-10-CM

## 2017-12-21 DIAGNOSIS — E782 Mixed hyperlipidemia: Secondary | ICD-10-CM

## 2017-12-21 DIAGNOSIS — L02411 Cutaneous abscess of right axilla: Secondary | ICD-10-CM | POA: Diagnosis not present

## 2017-12-21 DIAGNOSIS — I214 Non-ST elevation (NSTEMI) myocardial infarction: Secondary | ICD-10-CM | POA: Diagnosis not present

## 2017-12-21 DIAGNOSIS — I25110 Atherosclerotic heart disease of native coronary artery with unstable angina pectoris: Secondary | ICD-10-CM

## 2017-12-21 MED ORDER — NITROGLYCERIN 0.4 MG SL SUBL: 0.4000 mg | SUBLINGUAL_TABLET | SUBLINGUAL | 11 refills | Status: DC | PRN

## 2017-12-21 NOTE — Patient Instructions (Addendum)
Medication Instructions:  Your physician has recommended you make the following change in your medication:  CHANGE amiodarone to 100 mg daily for two weeks then STOP this medication Refill for nitroglycerin given  Labwork: Your physician recommends that you have the following labs drawn: Please come fasting at your follow-up appointment for labs.  Testing/Procedures: None  Follow-Up: Your physician recommends that you schedule a follow-up appointment in: 1 month  Any Other Special Instructions Will Be Listed Below (If Applicable).     If you need a refill on your cardiac medications before your next appointment, please call your pharmacy.   [place], RN, BSN

## 2017-12-21 NOTE — Addendum Note (Signed)
Addended by: Mattie Marlin on: 12/21/2017 03:39 PM   Modules accepted: Orders

## 2017-12-21 NOTE — Progress Notes (Signed)
Cardiology Office Note:    Date:  12/21/2017   ID:  REI MEDLEN, DOB 03-23-1946, MRN [id number]  PCP:  Cyndy Freeze, MD  Cardiologist:  Jenean Lindau, MD   Referring MD: Serita Grammes, MD    ASSESSMENT:    1. NSTEMI (non-ST elevated myocardial infarction) ([place])   2. Abscess of right axilla   3. BMI 33.0-33.9,adult   4. Essential hypertension   5. Coronary artery disease involving native coronary artery of native heart with unstable angina pectoris ([place])   6. S/P CABG (coronary artery bypass graft)   7. Mixed hyperlipidemia    PLAN:    In order of problems listed above:  1. Secondary prevention stressed with the patient.  Importance of compliance with diet and medication stressed and he vocalized understanding.  His blood pressure is stable.  He tells me that his primary care physician recently did blood work was he was told that it was fine. 2. Overall his evaluation is fine he is sternotomy is healing well.  Had some swelling in the left lower extremity at the area of the graft harvest but he tells me that it is getting better by every passing day.  Elevation of the lower extremity at rest was recommended and he vocalized understanding.  He will be seen in follow-up appointment in a month or earlier if he has any concerns.  At that time we will have fasting blood work including lipids. 3. He has not had any issues with arrhythmias.  I have asked him to cut down his amiodarone to half a dose of what he is taking for 2 weeks and then discontinue it.  His ambulation is also limited some because of orthopedic issues involving his left knee.  These are chronic according to the patient.   Medication Adjustments/Labs and Tests Ordered: Current medicines are reviewed at length with the patient today.  Concerns regarding medicines are outlined above.  No orders of the defined types were placed in this encounter.  No orders of the defined types were placed in this  encounter.    History of Present Illness:    NASON CONRADT is a 72 y.o. male who is being seen today for the evaluation of coronary artery disease at the request of Serita Grammes, MD.  Patient is a pleasant 72 year old male.  He has past medical history of essential hypertension he was evaluated by his cardiologist.  Subsequently the patient went to the emergency room and he was told that he was having a heart attack and was transferred to [facility] hospital for coronary angiography this revealed multiple obstructive stenosis and the patient underwent bypass surgery.  Subsequently has done fine.  No chest pain orthopnea or PND.  At the time of my evaluation, the patient is alert awake oriented and in no distress.  Past Medical History:  Diagnosis Date  . Arthritis   . BPH (benign prostatic hypertrophy)   . CAD (coronary artery disease)   . Colon cancer ([place])   . Elevated PSA   . Erectile dysfunction   . Essential tremor   . GERD (gastroesophageal reflux disease)   . Headache(784.0)   . Hiatal hernia   . Hyperlipidemia   . Hypertension   . Major depression, chronic   . Medial meniscus tear 10/11/2011  . Metabolic syndrome   . Nephrolithiasis    hx of  . Transient ischemic attack    hx of  . Trochanteric bursitis of right hip  Past Surgical History:  Procedure Laterality Date  . CARDIAC CATHETERIZATION  5/12,1/13   4 stents placed  . COLON SURGERY    . KNEE ARTHROSCOPY  10/11/2011   Procedure: ARTHROSCOPY KNEE;  Surgeon: Lorn Junes, MD;  Location: [facility];  Service: Orthopedics;  Laterality: Left;  Left Knee Arthroscopy with Medial and Lateral Partial Menisectomy, Chondroplasty  . LEFT HEART CATHETERIZATION WITH CORONARY ANGIOGRAM N/A 08/25/2011   Procedure: LEFT HEART CATHETERIZATION WITH CORONARY ANGIOGRAM;  Surgeon: Burnell Blanks, MD;  Location: [facility] CATH LAB;  Service: Cardiovascular;  Laterality: N/A;  . STERIOD INJECTION   10/11/2011   Procedure: STEROID INJECTION;  Surgeon: Lorn Junes, MD;  Location: [place];  Service: Orthopedics;  Laterality: Right;  Steroid Injection Second Toe  . TRANSURETHRAL RESECTION OF PROSTATE      Current Medications: Current Meds  Medication Sig  . aspirin 81 MG tablet Take 81-162 mg by mouth daily.   Marland Kitchen atorvastatin (LIPITOR) 20 MG tablet Take 1 tablet (20 mg total) by mouth daily. (Patient taking differently: Take 20 mg by mouth at bedtime. )  . carbidopa-levodopa (SINEMET IR) 25-100 MG tablet Take 0.5 tablets by mouth 3 (three) times daily.  . carbidopa-levodopa (SINEMET IR) 25-100 MG tablet Take 1 tablet by mouth 3 (three) times daily.  . clopidogrel (PLAVIX) 75 MG tablet TAKE 1 TABLET (75 MG TOTAL) BY MOUTH ONCE DAILY. (Patient taking differently: Take 75 mg by mouth daily. )  . erythromycin ophthalmic ointment Place 1 application into the right eye 3 (three) times daily. Put a thin strip on the lower eyelid 3 times per day.  . escitalopram (LEXAPRO) 5 MG tablet Take 2.5 mg by mouth at bedtime as needed.  Marland Kitchen esomeprazole (NEXIUM) 40 MG capsule Take 1 capsule (40 mg total) by mouth daily before breakfast.  . fluocinonide-emollient (LIDEX-E) 0.05 % cream Apply 1 application topically 2 (two) times daily. (Patient taking differently: Apply 1 application topically 2 (two) times daily as needed (for itching). )  . meloxicam (MOBIC) 7.5 MG tablet TAKE 1 TABLET BY MOUTH ONCE DAILY AS NEEDED (Patient taking differently: Take 7.5 mg by mouth once a day)  . metoprolol tartrate (LOPRESSOR) 25 MG tablet TAKE 1/2 TABLET BY MOUTH TWICE DAILY (Patient taking differently: Take 25 mg by mouth at bedtime)  . Multiple Vitamin (MULTI-VITAMINS) TABS Take 1 tablet by mouth daily.  . nitroGLYCERIN (NITROSTAT) 0.4 MG SL tablet Place 1 tablet (0.4 mg total) under the tongue every 5 (five) minutes as needed. Chest pain  . olopatadine (PATANOL) 0.1 % ophthalmic solution Place 1 drop into  both eyes daily as needed for irritation.  . pantoprazole (PROTONIX) 40 MG tablet Take 40 mg by mouth daily.  . prednisoLONE acetate (PRED FORTE) 1 % ophthalmic suspension Apply 1 drop to eye at bedtime.  Marland Kitchen rOPINIRole (REQUIP) 0.25 MG tablet Take 1 tablet (0.25 mg total) by mouth 3 (three) times daily for one week. Then take 2 tablets (0.50 mg total) by mouth 3 (three) times daily for one week. Then take 3 tablets (0.75 mg total) by mouth 3 (three) times daily for one week. Then take 4 tablets (1 mg total) by mouth 3 (three) times daily.  . tamsulosin (FLOMAX) 0.4 MG CAPS capsule Take 0.4 mg by mouth daily as needed (to relax the prostate).   . triamcinolone cream (KENALOG) 0.1 % Apply 1 application topically daily as needed (for itching of affected areas).   . [DISCONTINUED] amiodarone (PACERONE) 200  MG tablet Take 200 mg by mouth daily.     Allergies:   Testosterone; Fluoxetine; Other; and Requip [ropinirole]   Social History   Socioeconomic History  . Marital status: Married    Spouse name: Tammie  . Number of children: 1  . Years of education: 41  . Highest education level: Not on file  Occupational History  . 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 Financial resource strain: Not on file  . Food insecurity:    Worry: Not on file    Inability: Not on file  . Transportation needs:    Medical: Not on file    Non-medical: Not on file  Tobacco Use  . Smoking status: Never Smoker  . Smokeless tobacco: Never Used  Substance and Sexual Activity  . Alcohol use: No  . Drug use: No  . Sexual activity: Not on file  Lifestyle  . Physical activity:    Days per week: Not on file    Minutes per session: Not on file  . Stress: Not on file  Relationships  . Social connections:    Talks on phone: Not on file    Gets together: Not on file    Attends religious service: Not on file    Active member of club or organization: Not on file    Attends meetings of clubs or organizations: Not on  file    Relationship status: Not on file  Other Topics Concern  . Not on file  Social History Narrative   Lives at home with wife.   Married.   Caffeine use: none         Family History: The patient's family history includes Arthritis in his unknown relative; Coronary artery disease in his other; Heart disease in his brother, father, and mother; Hyperlipidemia in his brother, father, and unknown relative; Hypertension in his unknown relative; Stroke in his father. There is no history of Dementia.  ROS:   Please see the history of present illness.    All other systems reviewed and are negative.  EKGs/Labs/Other Studies Reviewed:    The following studies were reviewed today: I reviewed hospital records extensively and discussed with the patient at length.  EKG done today reveals sinus rhythm and nonspecific ST-T changes.   Recent Labs: 08/29/2017: BUN 13; Creatinine, Ser 0.91; Hemoglobin 15.9; Platelets 174; Potassium 3.9; Sodium 140  Recent Lipid Panel    Component Value Date/Time   CHOL 132 05/20/2015 1410   TRIG 136 05/20/2015 1410   TRIG 107 05/09/2010   HDL 37 (L) 05/20/2015 1410   CHOLHDL 3.6 05/20/2015 1410   VLDL 27 05/20/2015 1410   LDLCALC 68 05/20/2015 1410    Physical Exam:    VS:  BP 118/62 (BP Location: Left Arm, Patient Position: Sitting, Cuff Size: Normal)   Pulse 84   Ht 5\' 6"  (1.676 m)   Wt 206 lb (93.4 kg)   SpO2 98%   BMI 33.25 kg/m     Wt Readings from Last 3 Encounters:  12/21/17 206 lb (93.4 kg)  08/29/17 220 lb (99.8 kg)  01/02/17 215 lb 3.2 oz (97.6 kg)     GEN: Patient is in no acute distress HEENT: Normal NECK: No JVD; No carotid bruits LYMPHATICS: No lymphadenopathy CARDIAC: S1 S2 regular, 2/6 systolic murmur at the apex. RESPIRATORY:  Clear to auscultation without rales, wheezing or rhonchi  ABDOMEN: Soft, non-tender, non-distended MUSCULOSKELETAL:  No edema; No deformity  SKIN: Warm and dry NEUROLOGIC:  Alert and oriented  x  3 PSYCHIATRIC:  Normal affect    Signed, Jenean Lindau, MD  12/21/2017 3:20 PM    [facility]

## 2018-01-01 DIAGNOSIS — M7989 Other specified soft tissue disorders: Secondary | ICD-10-CM | POA: Diagnosis not present

## 2018-01-01 DIAGNOSIS — M25569 Pain in unspecified knee: Secondary | ICD-10-CM | POA: Diagnosis not present

## 2018-01-03 DIAGNOSIS — E785 Hyperlipidemia, unspecified: Secondary | ICD-10-CM | POA: Diagnosis not present

## 2018-01-03 DIAGNOSIS — I10 Essential (primary) hypertension: Secondary | ICD-10-CM | POA: Diagnosis not present

## 2018-01-03 DIAGNOSIS — Z7902 Long term (current) use of antithrombotics/antiplatelets: Secondary | ICD-10-CM | POA: Diagnosis not present

## 2018-01-03 DIAGNOSIS — R600 Localized edema: Secondary | ICD-10-CM | POA: Diagnosis not present

## 2018-01-03 DIAGNOSIS — M7989 Other specified soft tissue disorders: Secondary | ICD-10-CM | POA: Diagnosis not present

## 2018-01-03 DIAGNOSIS — I2510 Atherosclerotic heart disease of native coronary artery without angina pectoris: Secondary | ICD-10-CM | POA: Diagnosis not present

## 2018-01-03 DIAGNOSIS — G20 Parkinson's disease: Secondary | ICD-10-CM | POA: Diagnosis not present

## 2018-01-03 DIAGNOSIS — K449 Diaphragmatic hernia without obstruction or gangrene: Secondary | ICD-10-CM | POA: Diagnosis not present

## 2018-01-03 DIAGNOSIS — Z48812 Encounter for surgical aftercare following surgery on the circulatory system: Secondary | ICD-10-CM | POA: Diagnosis not present

## 2018-01-03 DIAGNOSIS — I214 Non-ST elevation (NSTEMI) myocardial infarction: Secondary | ICD-10-CM | POA: Diagnosis not present

## 2018-01-03 DIAGNOSIS — Z951 Presence of aortocoronary bypass graft: Secondary | ICD-10-CM | POA: Diagnosis not present

## 2018-01-03 DIAGNOSIS — D649 Anemia, unspecified: Secondary | ICD-10-CM | POA: Diagnosis not present

## 2018-01-03 DIAGNOSIS — Z7982 Long term (current) use of aspirin: Secondary | ICD-10-CM | POA: Diagnosis not present

## 2018-01-11 DIAGNOSIS — I214 Non-ST elevation (NSTEMI) myocardial infarction: Secondary | ICD-10-CM | POA: Diagnosis not present

## 2018-01-11 DIAGNOSIS — G20 Parkinson's disease: Secondary | ICD-10-CM | POA: Diagnosis not present

## 2018-01-11 DIAGNOSIS — Z7902 Long term (current) use of antithrombotics/antiplatelets: Secondary | ICD-10-CM | POA: Diagnosis not present

## 2018-01-11 DIAGNOSIS — K449 Diaphragmatic hernia without obstruction or gangrene: Secondary | ICD-10-CM | POA: Diagnosis not present

## 2018-01-11 DIAGNOSIS — E785 Hyperlipidemia, unspecified: Secondary | ICD-10-CM | POA: Diagnosis not present

## 2018-01-11 DIAGNOSIS — Z48812 Encounter for surgical aftercare following surgery on the circulatory system: Secondary | ICD-10-CM | POA: Diagnosis not present

## 2018-01-11 DIAGNOSIS — I2510 Atherosclerotic heart disease of native coronary artery without angina pectoris: Secondary | ICD-10-CM | POA: Diagnosis not present

## 2018-01-11 DIAGNOSIS — Z951 Presence of aortocoronary bypass graft: Secondary | ICD-10-CM | POA: Diagnosis not present

## 2018-01-11 DIAGNOSIS — I10 Essential (primary) hypertension: Secondary | ICD-10-CM | POA: Diagnosis not present

## 2018-01-11 DIAGNOSIS — Z7982 Long term (current) use of aspirin: Secondary | ICD-10-CM | POA: Diagnosis not present

## 2018-01-11 DIAGNOSIS — D649 Anemia, unspecified: Secondary | ICD-10-CM | POA: Diagnosis not present

## 2018-01-22 ENCOUNTER — Ambulatory Visit (INDEPENDENT_AMBULATORY_CARE_PROVIDER_SITE_OTHER): Payer: Medicare Other | Admitting: Cardiology

## 2018-01-22 ENCOUNTER — Encounter: Payer: Self-pay | Admitting: Cardiology

## 2018-01-22 VITALS — BP 120/68 | HR 79 | Ht 66.0 in | Wt 212.0 lb

## 2018-01-22 DIAGNOSIS — Z951 Presence of aortocoronary bypass graft: Secondary | ICD-10-CM | POA: Diagnosis not present

## 2018-01-22 DIAGNOSIS — R2689 Other abnormalities of gait and mobility: Secondary | ICD-10-CM | POA: Diagnosis not present

## 2018-01-22 DIAGNOSIS — I25110 Atherosclerotic heart disease of native coronary artery with unstable angina pectoris: Secondary | ICD-10-CM

## 2018-01-22 DIAGNOSIS — E785 Hyperlipidemia, unspecified: Secondary | ICD-10-CM

## 2018-01-22 DIAGNOSIS — I10 Essential (primary) hypertension: Secondary | ICD-10-CM | POA: Diagnosis not present

## 2018-01-22 DIAGNOSIS — M25562 Pain in left knee: Secondary | ICD-10-CM | POA: Diagnosis not present

## 2018-01-22 DIAGNOSIS — E782 Mixed hyperlipidemia: Secondary | ICD-10-CM | POA: Diagnosis not present

## 2018-01-22 DIAGNOSIS — M6281 Muscle weakness (generalized): Secondary | ICD-10-CM | POA: Diagnosis not present

## 2018-01-22 NOTE — Patient Instructions (Signed)
Medication Instructions:  Your physician recommends that you continue on your current medications as directed. Please refer to the Current Medication list given to you today.  Labwork: Your physician recommends that you have the following labs drawn: CBC, BMP, TSH, liver and lipid panel  Testing/Procedures: None  Follow-Up: Your physician recommends that you schedule a follow-up appointment in: 3 months  Any Other Special Instructions Will Be Listed Below (If Applicable).     If you need a refill on your cardiac medications before your next appointment, please call your pharmacy.   [facility]  Lanny Lipkin A, RN, BSN  

## 2018-01-22 NOTE — Progress Notes (Signed)
Cardiology Office Note:    Date:  01/22/2018   ID:  Shawn Meza, DOB 1945/12/13, MRN [id number]  PCP:  Cyndy Freeze, MD  Cardiologist:  Jenean Lindau, MD   Referring MD: Cyndy Freeze, MD    ASSESSMENT:    1. Essential hypertension   2. Coronary artery disease involving native coronary artery of native heart with unstable angina pectoris ([place])   3. Mixed hyperlipidemia   4. Dyslipidemia   5. S/P CABG (coronary artery bypass graft)    PLAN:    In order of problems listed above:  1. Secondary prevention stressed with the patient.  Importance of compliance with diet and medications stressed and he vocalized understanding.  His blood pressure is stable. 2. Risks of obesity explained he plans to diet and do better with exercise.  I told him to do exercises which may not bother his knee and he understands. 3. He will have fasting blood work included will be lipids. 4. Patient will be seen in follow-up appointment in 3 months or earlier if the patient has any concerns    Medication Adjustments/Labs and Tests Ordered: Current medicines are reviewed at length with the patient today.  Concerns regarding medicines are outlined above.  No orders of the defined types were placed in this encounter.  No orders of the defined types were placed in this encounter.    No chief complaint on file.    History of Present Illness:    Shawn Meza is a 72 y.o. male.  Patient has known coronary artery disease and has undergone CABG surgery.  He denies any problems at this time and takes care of activities of daily living.  No chest pain orthopnea or PND.  He does not exercise because he has arthritis issues of the left knee which are resolving and he plans to do better.  At the time of my evaluation, the patient is alert awake oriented and in no distress.  He has gained significant weight since last evaluation.  I cautioned him against this.  Past Medical History:  Diagnosis Date  .  Arthritis   . BPH (benign prostatic hypertrophy)   . CAD (coronary artery disease)   . Colon cancer ([place])   . Elevated PSA   . Erectile dysfunction   . Essential tremor   . GERD (gastroesophageal reflux disease)   . Headache(784.0)   . Hiatal hernia   . Hyperlipidemia   . Hypertension   . Major depression, chronic   . Medial meniscus tear 10/11/2011  . Metabolic syndrome   . Nephrolithiasis    hx of  . Transient ischemic attack    hx of  . Trochanteric bursitis of right hip     Past Surgical History:  Procedure Laterality Date  . CARDIAC CATHETERIZATION  5/12,1/13   4 stents placed  . COLON SURGERY    . KNEE ARTHROSCOPY  10/11/2011   Procedure: ARTHROSCOPY KNEE;  Surgeon: Lorn Junes, MD;  Location: [facility];  Service: Orthopedics;  Laterality: Left;  Left Knee Arthroscopy with Medial and Lateral Partial Menisectomy, Chondroplasty  . LEFT HEART CATHETERIZATION WITH CORONARY ANGIOGRAM N/A 08/25/2011   Procedure: LEFT HEART CATHETERIZATION WITH CORONARY ANGIOGRAM;  Surgeon: Burnell Blanks, MD;  Location: [facility] CATH LAB;  Service: Cardiovascular;  Laterality: N/A;  . STERIOD INJECTION  10/11/2011   Procedure: STEROID INJECTION;  Surgeon: Lorn Junes, MD;  Location: [place];  Service: Orthopedics;  Laterality: Right;  Steroid Injection  Second Toe  . TRANSURETHRAL RESECTION OF PROSTATE      Current Medications: Current Meds  Medication Sig  . aspirin 81 MG tablet Take 81-162 mg by mouth daily.   Marland Kitchen atorvastatin (LIPITOR) 20 MG tablet Take 1 tablet (20 mg total) by mouth daily. (Patient taking differently: Take 20 mg by mouth daily at 6 PM. )  . clopidogrel (PLAVIX) 75 MG tablet TAKE 1 TABLET (75 MG TOTAL) BY MOUTH ONCE DAILY. (Patient taking differently: Take 75 mg by mouth daily. )  . erythromycin ophthalmic ointment Place 1 application into the right eye 3 (three) times daily. Put a thin strip on the lower eyelid 3 times per day.  .  escitalopram (LEXAPRO) 5 MG tablet Take 2.5 mg by mouth at bedtime as needed.  . fluocinonide-emollient (LIDEX-E) 0.05 % cream Apply 1 application topically 2 (two) times daily. (Patient taking differently: Apply 1 application topically 2 (two) times daily as needed (for itching). )  . meloxicam (MOBIC) 7.5 MG tablet TAKE 1 TABLET BY MOUTH ONCE DAILY AS NEEDED (Patient taking differently: Take 7.5 mg by mouth once a day)  . metoprolol tartrate (LOPRESSOR) 25 MG tablet TAKE 1/2 TABLET BY MOUTH TWICE DAILY  . Multiple Vitamin (MULTI-VITAMINS) TABS Take 0.5 tablets by mouth daily.   . nitroGLYCERIN (NITROSTAT) 0.4 MG SL tablet Place 1 tablet (0.4 mg total) under the tongue every 5 (five) minutes as needed. Chest pain  . olopatadine (PATANOL) 0.1 % ophthalmic solution Place 1 drop into both eyes daily as needed for irritation.  . pantoprazole (PROTONIX) 40 MG tablet Take 40 mg by mouth daily.  . tamsulosin (FLOMAX) 0.4 MG CAPS capsule Take 0.4 mg by mouth daily as needed (to relax the prostate).      Allergies:   Testosterone; Fluoxetine; Other; and Requip [ropinirole]   Social History   Socioeconomic History  . Marital status: Married    Spouse name: Tammie  . Number of children: 1  . Years of education: 64  . Highest education level: Not on file  Occupational History  . 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 Financial resource strain: Not on file  . Food insecurity:    Worry: Not on file    Inability: Not on file  . Transportation needs:    Medical: Not on file    Non-medical: Not on file  Tobacco Use  . Smoking status: Never Smoker  . Smokeless tobacco: Never Used  Substance and Sexual Activity  . Alcohol use: No  . Drug use: No  . Sexual activity: Not on file  Lifestyle  . Physical activity:    Days per week: Not on file    Minutes per session: Not on file  . Stress: Not on file  Relationships  . Social connections:    Talks on phone: Not on file    Gets together: Not on  file    Attends religious service: Not on file    Active member of club or organization: Not on file    Attends meetings of clubs or organizations: Not on file    Relationship status: Not on file  Other Topics Concern  . Not on file  Social History Narrative   Lives at home with wife.   Married.   Caffeine use: none         Family History: The patient's family history includes Arthritis in his unknown relative; Coronary artery disease in his other; Heart disease in his brother, father, and mother; Hyperlipidemia in  his brother, father, and unknown relative; Hypertension in his unknown relative; Stroke in his father. There is no history of Dementia.  ROS:   Please see the history of present illness.    All other systems reviewed and are negative.  EKGs/Labs/Other Studies Reviewed:    The following studies were reviewed today: I reviewed my findings with the patient at length.   Recent Labs: 08/29/2017: BUN 13; Creatinine, Ser 0.91; Hemoglobin 15.9; Platelets 174; Potassium 3.9; Sodium 140  Recent Lipid Panel    Component Value Date/Time   CHOL 132 05/20/2015 1410   TRIG 136 05/20/2015 1410   TRIG 107 05/09/2010   HDL 37 (L) 05/20/2015 1410   CHOLHDL 3.6 05/20/2015 1410   VLDL 27 05/20/2015 1410   LDLCALC 68 05/20/2015 1410    Physical Exam:    VS:  BP 120/68 (BP Location: Left Arm, Patient Position: Sitting, Cuff Size: Normal)   Pulse 79   Ht 5\' 6"  (1.676 m)   Wt 212 lb (96.2 kg)   SpO2 99%   BMI 34.22 kg/m     Wt Readings from Last 3 Encounters:  01/22/18 212 lb (96.2 kg)  12/21/17 206 lb (93.4 kg)  08/29/17 220 lb (99.8 kg)     GEN: Patient is in no acute distress HEENT: Normal NECK: No JVD; No carotid bruits LYMPHATICS: No lymphadenopathy CARDIAC: Hear sounds regular, 2/6 systolic murmur at the apex. RESPIRATORY:  Clear to auscultation without rales, wheezing or rhonchi  ABDOMEN: Soft, non-tender, non-distended MUSCULOSKELETAL:  No edema; No deformity    SKIN: Warm and dry NEUROLOGIC:  Alert and oriented x 3 PSYCHIATRIC:  Normal affect   Signed, Jenean Lindau, MD  01/22/2018 8:58 AM    [place]

## 2018-01-23 LAB — HEPATIC FUNCTION PANEL
ALT: 13 IU/L (ref 0–44)
AST: 19 IU/L (ref 0–40)
Albumin: 4.3 g/dL (ref 3.5–4.8)
Alkaline Phosphatase: 73 IU/L (ref 39–117)
Bilirubin Total: 0.2 mg/dL (ref 0.0–1.2)
Bilirubin, Direct: 0.1 mg/dL (ref 0.00–0.40)
Total Protein: 7 g/dL (ref 6.0–8.5)

## 2018-01-23 LAB — BASIC METABOLIC PANEL
BUN/Creatinine Ratio: 14 (ref 10–24)
BUN: 13 mg/dL (ref 8–27)
CO2: 24 mmol/L (ref 20–29)
Calcium: 8.9 mg/dL (ref 8.6–10.2)
Chloride: 108 mmol/L — ABNORMAL HIGH (ref 96–106)
Creatinine, Ser: 0.96 mg/dL (ref 0.76–1.27)
GFR calc Af Amer: 90 mL/min/{1.73_m2} (ref >59)
GFR calc non Af Amer: 78 mL/min/{1.73_m2} (ref >59)
Glucose: 94 mg/dL (ref 65–99)
Potassium: 4.3 mmol/L (ref 3.5–5.2)
Sodium: 146 mmol/L — ABNORMAL HIGH (ref 134–144)

## 2018-01-23 LAB — LIPID PANEL
Chol/HDL Ratio: 3 ratio (ref 0.0–5.0)
Cholesterol, Total: 118 mg/dL (ref 100–199)
HDL: 40 mg/dL (ref >39)
LDL Calculated: 58 mg/dL (ref 0–99)
Triglycerides: 98 mg/dL (ref 0–149)
VLDL Cholesterol Cal: 20 mg/dL (ref 5–40)

## 2018-01-23 LAB — CBC WITH DIFFERENTIAL/PLATELET
Basophils Absolute: 0 10*3/uL (ref 0.0–0.2)
Basos: 1 %
EOS (ABSOLUTE): 0.4 10*3/uL (ref 0.0–0.4)
Eos: 6 %
Hematocrit: 43.5 % (ref 37.5–51.0)
Hemoglobin: 13.9 g/dL (ref 13.0–17.7)
Immature Grans (Abs): 0 10*3/uL (ref 0.0–0.1)
Immature Granulocytes: 0 %
Lymphocytes Absolute: 2.7 10*3/uL (ref 0.7–3.1)
Lymphs: 35 %
MCH: 30 pg (ref 26.6–33.0)
MCHC: 32 g/dL (ref 31.5–35.7)
MCV: 94 fL (ref 79–97)
Monocytes Absolute: 0.7 10*3/uL (ref 0.1–0.9)
Monocytes: 9 %
Neutrophils Absolute: 3.9 10*3/uL (ref 1.4–7.0)
Neutrophils: 49 %
Platelets: 189 10*3/uL (ref 150–450)
RBC: 4.64 x10E6/uL (ref 4.14–5.80)
RDW: 15 % (ref 12.3–15.4)
WBC: 7.7 10*3/uL (ref 3.4–10.8)

## 2018-01-23 LAB — TSH: TSH: 3.21 u[IU]/mL (ref 0.450–4.500)

## 2018-01-24 DIAGNOSIS — M6281 Muscle weakness (generalized): Secondary | ICD-10-CM | POA: Diagnosis not present

## 2018-01-24 DIAGNOSIS — M25562 Pain in left knee: Secondary | ICD-10-CM | POA: Diagnosis not present

## 2018-01-24 DIAGNOSIS — R2689 Other abnormalities of gait and mobility: Secondary | ICD-10-CM | POA: Diagnosis not present

## 2018-01-26 DIAGNOSIS — Z48812 Encounter for surgical aftercare following surgery on the circulatory system: Secondary | ICD-10-CM | POA: Diagnosis not present

## 2018-01-29 DIAGNOSIS — R2689 Other abnormalities of gait and mobility: Secondary | ICD-10-CM | POA: Diagnosis not present

## 2018-01-29 DIAGNOSIS — M6281 Muscle weakness (generalized): Secondary | ICD-10-CM | POA: Diagnosis not present

## 2018-01-29 DIAGNOSIS — M25562 Pain in left knee: Secondary | ICD-10-CM | POA: Diagnosis not present

## 2018-03-28 DIAGNOSIS — M542 Cervicalgia: Secondary | ICD-10-CM | POA: Diagnosis not present

## 2018-03-28 DIAGNOSIS — Z131 Encounter for screening for diabetes mellitus: Secondary | ICD-10-CM | POA: Diagnosis not present

## 2018-03-28 DIAGNOSIS — G20 Parkinson's disease: Secondary | ICD-10-CM | POA: Diagnosis not present

## 2018-03-28 DIAGNOSIS — R202 Paresthesia of skin: Secondary | ICD-10-CM | POA: Diagnosis not present

## 2018-03-28 DIAGNOSIS — Z79899 Other long term (current) drug therapy: Secondary | ICD-10-CM | POA: Diagnosis not present

## 2018-03-28 DIAGNOSIS — N200 Calculus of kidney: Secondary | ICD-10-CM | POA: Diagnosis not present

## 2018-04-04 DIAGNOSIS — M25612 Stiffness of left shoulder, not elsewhere classified: Secondary | ICD-10-CM | POA: Diagnosis not present

## 2018-04-04 DIAGNOSIS — R293 Abnormal posture: Secondary | ICD-10-CM | POA: Diagnosis not present

## 2018-04-04 DIAGNOSIS — M542 Cervicalgia: Secondary | ICD-10-CM | POA: Diagnosis not present

## 2018-04-04 DIAGNOSIS — M6281 Muscle weakness (generalized): Secondary | ICD-10-CM | POA: Diagnosis not present

## 2018-04-04 DIAGNOSIS — M25662 Stiffness of left knee, not elsewhere classified: Secondary | ICD-10-CM | POA: Diagnosis not present

## 2018-04-04 DIAGNOSIS — R2689 Other abnormalities of gait and mobility: Secondary | ICD-10-CM | POA: Diagnosis not present

## 2018-04-04 DIAGNOSIS — M2560 Stiffness of unspecified joint, not elsewhere classified: Secondary | ICD-10-CM | POA: Diagnosis not present

## 2018-04-04 DIAGNOSIS — M25562 Pain in left knee: Secondary | ICD-10-CM | POA: Diagnosis not present

## 2018-04-04 DIAGNOSIS — M25611 Stiffness of right shoulder, not elsewhere classified: Secondary | ICD-10-CM | POA: Diagnosis not present

## 2018-04-04 DIAGNOSIS — R208 Other disturbances of skin sensation: Secondary | ICD-10-CM | POA: Diagnosis not present

## 2018-04-10 DIAGNOSIS — M6281 Muscle weakness (generalized): Secondary | ICD-10-CM | POA: Diagnosis not present

## 2018-04-10 DIAGNOSIS — R2689 Other abnormalities of gait and mobility: Secondary | ICD-10-CM | POA: Diagnosis not present

## 2018-04-10 DIAGNOSIS — M25562 Pain in left knee: Secondary | ICD-10-CM | POA: Diagnosis not present

## 2018-04-10 DIAGNOSIS — M25662 Stiffness of left knee, not elsewhere classified: Secondary | ICD-10-CM | POA: Diagnosis not present

## 2018-04-10 DIAGNOSIS — M542 Cervicalgia: Secondary | ICD-10-CM | POA: Diagnosis not present

## 2018-04-10 DIAGNOSIS — R293 Abnormal posture: Secondary | ICD-10-CM | POA: Diagnosis not present

## 2018-04-10 DIAGNOSIS — R208 Other disturbances of skin sensation: Secondary | ICD-10-CM | POA: Diagnosis not present

## 2018-04-10 DIAGNOSIS — M25611 Stiffness of right shoulder, not elsewhere classified: Secondary | ICD-10-CM | POA: Diagnosis not present

## 2018-04-10 DIAGNOSIS — M2560 Stiffness of unspecified joint, not elsewhere classified: Secondary | ICD-10-CM | POA: Diagnosis not present

## 2018-04-10 DIAGNOSIS — M25612 Stiffness of left shoulder, not elsewhere classified: Secondary | ICD-10-CM | POA: Diagnosis not present

## 2018-04-12 DIAGNOSIS — I10 Essential (primary) hypertension: Secondary | ICD-10-CM | POA: Diagnosis not present

## 2018-04-12 DIAGNOSIS — Z955 Presence of coronary angioplasty implant and graft: Secondary | ICD-10-CM | POA: Diagnosis not present

## 2018-04-12 DIAGNOSIS — K219 Gastro-esophageal reflux disease without esophagitis: Secondary | ICD-10-CM | POA: Diagnosis not present

## 2018-04-12 DIAGNOSIS — I252 Old myocardial infarction: Secondary | ICD-10-CM | POA: Diagnosis not present

## 2018-04-12 DIAGNOSIS — I2510 Atherosclerotic heart disease of native coronary artery without angina pectoris: Secondary | ICD-10-CM | POA: Diagnosis not present

## 2018-04-13 DIAGNOSIS — I10 Essential (primary) hypertension: Secondary | ICD-10-CM | POA: Diagnosis not present

## 2018-04-13 DIAGNOSIS — I252 Old myocardial infarction: Secondary | ICD-10-CM | POA: Diagnosis not present

## 2018-04-13 DIAGNOSIS — I2510 Atherosclerotic heart disease of native coronary artery without angina pectoris: Secondary | ICD-10-CM | POA: Diagnosis not present

## 2018-04-13 DIAGNOSIS — Z955 Presence of coronary angioplasty implant and graft: Secondary | ICD-10-CM | POA: Diagnosis not present

## 2018-04-13 DIAGNOSIS — K219 Gastro-esophageal reflux disease without esophagitis: Secondary | ICD-10-CM | POA: Diagnosis not present

## 2018-04-16 DIAGNOSIS — K219 Gastro-esophageal reflux disease without esophagitis: Secondary | ICD-10-CM | POA: Diagnosis not present

## 2018-04-16 DIAGNOSIS — Z955 Presence of coronary angioplasty implant and graft: Secondary | ICD-10-CM | POA: Diagnosis not present

## 2018-04-16 DIAGNOSIS — I10 Essential (primary) hypertension: Secondary | ICD-10-CM | POA: Diagnosis not present

## 2018-04-16 DIAGNOSIS — I2510 Atherosclerotic heart disease of native coronary artery without angina pectoris: Secondary | ICD-10-CM | POA: Diagnosis not present

## 2018-04-16 DIAGNOSIS — I252 Old myocardial infarction: Secondary | ICD-10-CM | POA: Diagnosis not present

## 2018-04-16 DIAGNOSIS — I6523 Occlusion and stenosis of bilateral carotid arteries: Secondary | ICD-10-CM | POA: Diagnosis not present

## 2018-04-17 DIAGNOSIS — M25611 Stiffness of right shoulder, not elsewhere classified: Secondary | ICD-10-CM | POA: Diagnosis not present

## 2018-04-17 DIAGNOSIS — M6281 Muscle weakness (generalized): Secondary | ICD-10-CM | POA: Diagnosis not present

## 2018-04-17 DIAGNOSIS — R293 Abnormal posture: Secondary | ICD-10-CM | POA: Diagnosis not present

## 2018-04-17 DIAGNOSIS — R208 Other disturbances of skin sensation: Secondary | ICD-10-CM | POA: Diagnosis not present

## 2018-04-17 DIAGNOSIS — R2689 Other abnormalities of gait and mobility: Secondary | ICD-10-CM | POA: Diagnosis not present

## 2018-04-17 DIAGNOSIS — M25662 Stiffness of left knee, not elsewhere classified: Secondary | ICD-10-CM | POA: Diagnosis not present

## 2018-04-17 DIAGNOSIS — M25562 Pain in left knee: Secondary | ICD-10-CM | POA: Diagnosis not present

## 2018-04-17 DIAGNOSIS — M542 Cervicalgia: Secondary | ICD-10-CM | POA: Diagnosis not present

## 2018-04-17 DIAGNOSIS — M2560 Stiffness of unspecified joint, not elsewhere classified: Secondary | ICD-10-CM | POA: Diagnosis not present

## 2018-04-17 DIAGNOSIS — M25612 Stiffness of left shoulder, not elsewhere classified: Secondary | ICD-10-CM | POA: Diagnosis not present

## 2018-04-18 DIAGNOSIS — I252 Old myocardial infarction: Secondary | ICD-10-CM | POA: Diagnosis not present

## 2018-04-18 DIAGNOSIS — I10 Essential (primary) hypertension: Secondary | ICD-10-CM | POA: Diagnosis not present

## 2018-04-18 DIAGNOSIS — I2510 Atherosclerotic heart disease of native coronary artery without angina pectoris: Secondary | ICD-10-CM | POA: Diagnosis not present

## 2018-04-18 DIAGNOSIS — Z955 Presence of coronary angioplasty implant and graft: Secondary | ICD-10-CM | POA: Diagnosis not present

## 2018-04-18 DIAGNOSIS — K219 Gastro-esophageal reflux disease without esophagitis: Secondary | ICD-10-CM | POA: Diagnosis not present

## 2018-04-19 DIAGNOSIS — R208 Other disturbances of skin sensation: Secondary | ICD-10-CM | POA: Diagnosis not present

## 2018-04-19 DIAGNOSIS — M25611 Stiffness of right shoulder, not elsewhere classified: Secondary | ICD-10-CM | POA: Diagnosis not present

## 2018-04-19 DIAGNOSIS — M542 Cervicalgia: Secondary | ICD-10-CM | POA: Diagnosis not present

## 2018-04-19 DIAGNOSIS — M25662 Stiffness of left knee, not elsewhere classified: Secondary | ICD-10-CM | POA: Diagnosis not present

## 2018-04-19 DIAGNOSIS — M6281 Muscle weakness (generalized): Secondary | ICD-10-CM | POA: Diagnosis not present

## 2018-04-19 DIAGNOSIS — R2689 Other abnormalities of gait and mobility: Secondary | ICD-10-CM | POA: Diagnosis not present

## 2018-04-19 DIAGNOSIS — M25612 Stiffness of left shoulder, not elsewhere classified: Secondary | ICD-10-CM | POA: Diagnosis not present

## 2018-04-19 DIAGNOSIS — R293 Abnormal posture: Secondary | ICD-10-CM | POA: Diagnosis not present

## 2018-04-19 DIAGNOSIS — M2560 Stiffness of unspecified joint, not elsewhere classified: Secondary | ICD-10-CM | POA: Diagnosis not present

## 2018-04-19 DIAGNOSIS — M25562 Pain in left knee: Secondary | ICD-10-CM | POA: Diagnosis not present

## 2018-04-20 DIAGNOSIS — Z955 Presence of coronary angioplasty implant and graft: Secondary | ICD-10-CM | POA: Diagnosis not present

## 2018-04-20 DIAGNOSIS — I10 Essential (primary) hypertension: Secondary | ICD-10-CM | POA: Diagnosis not present

## 2018-04-20 DIAGNOSIS — I252 Old myocardial infarction: Secondary | ICD-10-CM | POA: Diagnosis not present

## 2018-04-20 DIAGNOSIS — K219 Gastro-esophageal reflux disease without esophagitis: Secondary | ICD-10-CM | POA: Diagnosis not present

## 2018-04-20 DIAGNOSIS — I2510 Atherosclerotic heart disease of native coronary artery without angina pectoris: Secondary | ICD-10-CM | POA: Diagnosis not present

## 2018-04-23 DIAGNOSIS — I252 Old myocardial infarction: Secondary | ICD-10-CM | POA: Diagnosis not present

## 2018-04-23 DIAGNOSIS — K219 Gastro-esophageal reflux disease without esophagitis: Secondary | ICD-10-CM | POA: Diagnosis not present

## 2018-04-23 DIAGNOSIS — I2510 Atherosclerotic heart disease of native coronary artery without angina pectoris: Secondary | ICD-10-CM | POA: Diagnosis not present

## 2018-04-23 DIAGNOSIS — I10 Essential (primary) hypertension: Secondary | ICD-10-CM | POA: Diagnosis not present

## 2018-04-23 DIAGNOSIS — Z955 Presence of coronary angioplasty implant and graft: Secondary | ICD-10-CM | POA: Diagnosis not present

## 2018-04-24 DIAGNOSIS — M25612 Stiffness of left shoulder, not elsewhere classified: Secondary | ICD-10-CM | POA: Diagnosis not present

## 2018-04-24 DIAGNOSIS — M25662 Stiffness of left knee, not elsewhere classified: Secondary | ICD-10-CM | POA: Diagnosis not present

## 2018-04-24 DIAGNOSIS — M6281 Muscle weakness (generalized): Secondary | ICD-10-CM | POA: Diagnosis not present

## 2018-04-24 DIAGNOSIS — R208 Other disturbances of skin sensation: Secondary | ICD-10-CM | POA: Diagnosis not present

## 2018-04-24 DIAGNOSIS — M542 Cervicalgia: Secondary | ICD-10-CM | POA: Diagnosis not present

## 2018-04-24 DIAGNOSIS — R293 Abnormal posture: Secondary | ICD-10-CM | POA: Diagnosis not present

## 2018-04-24 DIAGNOSIS — R2689 Other abnormalities of gait and mobility: Secondary | ICD-10-CM | POA: Diagnosis not present

## 2018-04-24 DIAGNOSIS — M2560 Stiffness of unspecified joint, not elsewhere classified: Secondary | ICD-10-CM | POA: Diagnosis not present

## 2018-04-24 DIAGNOSIS — M25611 Stiffness of right shoulder, not elsewhere classified: Secondary | ICD-10-CM | POA: Diagnosis not present

## 2018-04-24 DIAGNOSIS — M25562 Pain in left knee: Secondary | ICD-10-CM | POA: Diagnosis not present

## 2018-04-25 DIAGNOSIS — I2510 Atherosclerotic heart disease of native coronary artery without angina pectoris: Secondary | ICD-10-CM | POA: Diagnosis not present

## 2018-04-25 DIAGNOSIS — Z955 Presence of coronary angioplasty implant and graft: Secondary | ICD-10-CM | POA: Diagnosis not present

## 2018-04-25 DIAGNOSIS — K219 Gastro-esophageal reflux disease without esophagitis: Secondary | ICD-10-CM | POA: Diagnosis not present

## 2018-04-25 DIAGNOSIS — I252 Old myocardial infarction: Secondary | ICD-10-CM | POA: Diagnosis not present

## 2018-04-25 DIAGNOSIS — I10 Essential (primary) hypertension: Secondary | ICD-10-CM | POA: Diagnosis not present

## 2018-04-26 DIAGNOSIS — R2689 Other abnormalities of gait and mobility: Secondary | ICD-10-CM | POA: Diagnosis not present

## 2018-04-26 DIAGNOSIS — M542 Cervicalgia: Secondary | ICD-10-CM | POA: Diagnosis not present

## 2018-04-26 DIAGNOSIS — M25562 Pain in left knee: Secondary | ICD-10-CM | POA: Diagnosis not present

## 2018-04-26 DIAGNOSIS — M2560 Stiffness of unspecified joint, not elsewhere classified: Secondary | ICD-10-CM | POA: Diagnosis not present

## 2018-04-26 DIAGNOSIS — M25612 Stiffness of left shoulder, not elsewhere classified: Secondary | ICD-10-CM | POA: Diagnosis not present

## 2018-04-26 DIAGNOSIS — R208 Other disturbances of skin sensation: Secondary | ICD-10-CM | POA: Diagnosis not present

## 2018-04-26 DIAGNOSIS — M25611 Stiffness of right shoulder, not elsewhere classified: Secondary | ICD-10-CM | POA: Diagnosis not present

## 2018-04-26 DIAGNOSIS — I2510 Atherosclerotic heart disease of native coronary artery without angina pectoris: Secondary | ICD-10-CM | POA: Diagnosis not present

## 2018-04-26 DIAGNOSIS — M25662 Stiffness of left knee, not elsewhere classified: Secondary | ICD-10-CM | POA: Diagnosis not present

## 2018-04-26 DIAGNOSIS — E7800 Pure hypercholesterolemia, unspecified: Secondary | ICD-10-CM | POA: Diagnosis not present

## 2018-04-26 DIAGNOSIS — R293 Abnormal posture: Secondary | ICD-10-CM | POA: Diagnosis not present

## 2018-04-26 DIAGNOSIS — M6281 Muscle weakness (generalized): Secondary | ICD-10-CM | POA: Diagnosis not present

## 2018-04-26 DIAGNOSIS — Z0000 Encounter for general adult medical examination without abnormal findings: Secondary | ICD-10-CM | POA: Diagnosis not present

## 2018-04-30 DIAGNOSIS — M2560 Stiffness of unspecified joint, not elsewhere classified: Secondary | ICD-10-CM | POA: Diagnosis not present

## 2018-04-30 DIAGNOSIS — Z955 Presence of coronary angioplasty implant and graft: Secondary | ICD-10-CM | POA: Diagnosis not present

## 2018-04-30 DIAGNOSIS — R2689 Other abnormalities of gait and mobility: Secondary | ICD-10-CM | POA: Diagnosis not present

## 2018-04-30 DIAGNOSIS — M25612 Stiffness of left shoulder, not elsewhere classified: Secondary | ICD-10-CM | POA: Diagnosis not present

## 2018-04-30 DIAGNOSIS — M542 Cervicalgia: Secondary | ICD-10-CM | POA: Diagnosis not present

## 2018-04-30 DIAGNOSIS — M25662 Stiffness of left knee, not elsewhere classified: Secondary | ICD-10-CM | POA: Diagnosis not present

## 2018-04-30 DIAGNOSIS — M25562 Pain in left knee: Secondary | ICD-10-CM | POA: Diagnosis not present

## 2018-04-30 DIAGNOSIS — I10 Essential (primary) hypertension: Secondary | ICD-10-CM | POA: Diagnosis not present

## 2018-04-30 DIAGNOSIS — K219 Gastro-esophageal reflux disease without esophagitis: Secondary | ICD-10-CM | POA: Diagnosis not present

## 2018-04-30 DIAGNOSIS — M6281 Muscle weakness (generalized): Secondary | ICD-10-CM | POA: Diagnosis not present

## 2018-04-30 DIAGNOSIS — M25611 Stiffness of right shoulder, not elsewhere classified: Secondary | ICD-10-CM | POA: Diagnosis not present

## 2018-04-30 DIAGNOSIS — I2510 Atherosclerotic heart disease of native coronary artery without angina pectoris: Secondary | ICD-10-CM | POA: Diagnosis not present

## 2018-04-30 DIAGNOSIS — I252 Old myocardial infarction: Secondary | ICD-10-CM | POA: Diagnosis not present

## 2018-04-30 DIAGNOSIS — R293 Abnormal posture: Secondary | ICD-10-CM | POA: Diagnosis not present

## 2018-04-30 DIAGNOSIS — R208 Other disturbances of skin sensation: Secondary | ICD-10-CM | POA: Diagnosis not present

## 2018-05-15 ENCOUNTER — Ambulatory Visit: Payer: Self-pay | Admitting: Cardiology

## 2018-05-15 NOTE — Progress Notes (Signed)
Cardiology Office Note:    Date:  05/16/2018   ID:  Shawn Meza, DOB [birth year], MRN [id number]  PCP:  Cyndy Freeze, MD  Cardiologist:  Shirlee More, MD    Referring MD: Cyndy Freeze, MD    ASSESSMENT:    1. Coronary artery disease involving native coronary artery of native heart with angina pectoris ([place])   2. Essential hypertension   3. Dyslipidemia   4. Shortness of breath    PLAN:    In order of problems listed above:  1. CAD is stable after his recent revascularization continue medical treatment I note that he is on both aspirin and clopidogrel along with a statin and beta-blocker.  Despite a reduced ejection fraction he is not on ACE or arm or ARNI. 2. Stable blood pressure target 3. Continue statin lipid profile and treatment shows an LDL of 58 cholesterol 118 normal liver function 4. Shortness of breath may be related to left ventricular dysfunction recheck echocardiogram proBNP level make a decision for requires guideline directed therapy with distal diuretic or ARNI.   Next appointment: 1 month   Medication Adjustments/Labs and Tests Ordered: Current medicines are reviewed at length with the patient today.  Concerns regarding medicines are outlined above.  Orders Placed This Encounter  Procedures  . Basic metabolic panel  . Pro b natriuretic peptide (BNP)  . ECHOCARDIOGRAM COMPLETE   No orders of the defined types were placed in this encounter.   No chief complaint on file.   History of Present Illness:    Shawn Meza is a 72 y.o. male with a hx of CAD CABG in March 2019 EF 30-35%  last seen 01/22/18. Compliance with diet, lifestyle and medications: Yes  Although I have no records he tells me that last week he went to cardiac rehab at [facility] was breathless and the staff told him he needed to be seen by me before returning to the program.  It is difficult to pin him down but he does notice that he fatigues easily short of breath when he  attempts to do cardiac rehab but not during daily activities and despite a reduced ejection fraction he said no edema orthopnea no chest pain palpitation or syncope.  Overall he is struggling in life he is alone and has no one to turn to day-to-day. He also had a carotid duplex done in the last 2 weeks at [facility] I will try to obtain that report Past Medical History:  Diagnosis Date  . Arthritis   . BPH (benign prostatic hypertrophy)   . CAD (coronary artery disease)   . Colon cancer ([place])   . Elevated PSA   . Erectile dysfunction   . Essential tremor   . GERD (gastroesophageal reflux disease)   . Headache(784.0)   . Hiatal hernia   . Hyperlipidemia   . Hypertension   . Major depression, chronic   . Medial meniscus tear 10/11/2011  . Metabolic syndrome   . Nephrolithiasis    hx of  . Transient ischemic attack    hx of  . Trochanteric bursitis of right hip     Past Surgical History:  Procedure Laterality Date  . CARDIAC CATHETERIZATION  5/12,1/13   4 stents placed  . COLON SURGERY    . KNEE ARTHROSCOPY  10/11/2011   Procedure: ARTHROSCOPY KNEE;  Surgeon: Lorn Junes, MD;  Location: [facility];  Service: Orthopedics;  Laterality: Left;  Left Knee Arthroscopy with Medial and Lateral Partial  Menisectomy, Chondroplasty  . LEFT HEART CATHETERIZATION WITH CORONARY ANGIOGRAM N/A 08/25/2011   Procedure: LEFT HEART CATHETERIZATION WITH CORONARY ANGIOGRAM;  Surgeon: Burnell Blanks, MD;  Location: [facility] CATH LAB;  Service: Cardiovascular;  Laterality: N/A;  . STERIOD INJECTION  10/11/2011   Procedure: STEROID INJECTION;  Surgeon: Lorn Junes, MD;  Location: [place];  Service: Orthopedics;  Laterality: Right;  Steroid Injection Second Toe  . TRANSURETHRAL RESECTION OF PROSTATE      Current Medications: Current Meds  Medication Sig  . aspirin 81 MG tablet Take 81 mg by mouth daily.   Marland Kitchen atorvastatin (LIPITOR) 20 MG tablet Take 1 tablet  (20 mg total) by mouth daily. (Patient taking differently: Take 20 mg by mouth daily at 6 PM. )  . clopidogrel (PLAVIX) 75 MG tablet TAKE 1 TABLET (75 MG TOTAL) BY MOUTH ONCE DAILY. (Patient taking differently: Take 75 mg by mouth daily. )  . erythromycin ophthalmic ointment Place 1 application into the right eye 3 (three) times daily. Put a thin strip on the lower eyelid 3 times per day.  . escitalopram (LEXAPRO) 5 MG tablet Take 5 mg by mouth daily.   . fluocinonide-emollient (LIDEX-E) 0.05 % cream Apply 1 application topically 2 (two) times daily. (Patient taking differently: Apply 1 application topically 2 (two) times daily as needed (for itching). )  . meloxicam (MOBIC) 7.5 MG tablet TAKE 1 TABLET BY MOUTH ONCE DAILY AS NEEDED (Patient taking differently: Take 7.5 mg by mouth once a day)  . metoprolol tartrate (LOPRESSOR) 25 MG tablet TAKE 1/2 TABLET BY MOUTH TWICE DAILY  . Multiple Vitamin (MULTI-VITAMINS) TABS Take 0.5 tablets by mouth daily.   . nitroGLYCERIN (NITROSTAT) 0.4 MG SL tablet Place 1 tablet (0.4 mg total) under the tongue every 5 (five) minutes as needed. Chest pain  . olopatadine (PATANOL) 0.1 % ophthalmic solution Place 1 drop into both eyes daily as needed for irritation.  . pantoprazole (PROTONIX) 40 MG tablet Take 40 mg by mouth daily.  . tamsulosin (FLOMAX) 0.4 MG CAPS capsule Take 0.4 mg by mouth daily as needed (to relax the prostate).      Allergies:   Testosterone; Fluoxetine; Other; and Requip [ropinirole]   Social History   Socioeconomic History  . Marital status: Married    Spouse name: Tammie  . Number of children: 1  . Years of education: 32  . Highest education level: Not on file  Occupational History  . 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 Financial resource strain: Not on file  . Food insecurity:    Worry: Not on file    Inability: Not on file  . Transportation needs:    Medical: Not on file    Non-medical: Not on file  Tobacco Use  . Smoking  status: Never Smoker  . Smokeless tobacco: Never Used  Substance and Sexual Activity  . Alcohol use: No  . Drug use: No  . Sexual activity: Not on file  Lifestyle  . Physical activity:    Days per week: Not on file    Minutes per session: Not on file  . Stress: Not on file  Relationships  . Social connections:    Talks on phone: Not on file    Gets together: Not on file    Attends religious service: Not on file    Active member of club or organization: Not on file    Attends meetings of clubs or organizations: Not on file    Relationship  status: Not on file  Other Topics Concern  . Not on file  Social History Narrative   Lives at home with wife.   Married.   Caffeine use: none         Family History: The patient's family history includes Arthritis in his unknown relative; Coronary artery disease in his other; Heart disease in his brother, father, and mother; Hyperlipidemia in his brother, father, and unknown relative; Hypertension in his unknown relative; Stroke in his father. There is no history of Dementia. ROS:   Please see the history of present illness.    All other systems reviewed and are negative.  EKGs/Labs/Other Studies Reviewed:    The following studies were reviewed today:   Recent Labs: 01/22/2018: ALT 13; BUN 13; Creatinine, Ser 0.96; Hemoglobin 13.9; Platelets 189; Potassium 4.3; Sodium 146; TSH 3.210  Recent Lipid Panel    Component Value Date/Time   CHOL 118 01/22/2018 0905   TRIG 98 01/22/2018 0905   TRIG 107 05/09/2010   HDL 40 01/22/2018 0905   CHOLHDL 3.0 01/22/2018 0905   CHOLHDL 3.6 05/20/2015 1410   VLDL 27 05/20/2015 1410   LDLCALC 58 01/22/2018 0905    Physical Exam:    VS:  BP 128/74 (BP Location: Right Arm, Patient Position: Sitting, Cuff Size: Large)   Pulse 80   Ht 5\' 6"  (1.676 m)   Wt 215 lb 1.9 oz (97.6 kg)   SpO2 97%   BMI 34.72 kg/m     Wt Readings from Last 3 Encounters:  05/16/18 215 lb 1.9 oz (97.6 kg)  01/22/18 212  lb (96.2 kg)  12/21/17 206 lb (93.4 kg)     GEN: He looks frail and has a marked tremor well nourished, well developed in no acute distress HEENT: Normal NECK: No JVD; No carotid bruits LYMPHATICS: No lymphadenopathy CARDIAC: RRR, no murmurs, rubs, gallops RESPIRATORY:  Clear to auscultation without rales, wheezing or rhonchi  ABDOMEN: Soft, non-tender, non-distended MUSCULOSKELETAL:  No edema; No deformity  SKIN: Warm and dry NEUROLOGIC:  Alert and oriented x 3 PSYCHIATRIC:  Normal affect    Signed, Shirlee More, MD  05/16/2018 5:04 PM    [facility]

## 2018-05-16 ENCOUNTER — Encounter: Payer: Self-pay | Admitting: Cardiology

## 2018-05-16 ENCOUNTER — Ambulatory Visit (INDEPENDENT_AMBULATORY_CARE_PROVIDER_SITE_OTHER): Payer: Medicare Other | Admitting: Cardiology

## 2018-05-16 VITALS — BP 128/74 | HR 80 | Ht 66.0 in | Wt 215.1 lb

## 2018-05-16 DIAGNOSIS — E785 Hyperlipidemia, unspecified: Secondary | ICD-10-CM

## 2018-05-16 DIAGNOSIS — I10 Essential (primary) hypertension: Secondary | ICD-10-CM | POA: Diagnosis not present

## 2018-05-16 DIAGNOSIS — R0602 Shortness of breath: Secondary | ICD-10-CM

## 2018-05-16 DIAGNOSIS — I25119 Atherosclerotic heart disease of native coronary artery with unspecified angina pectoris: Secondary | ICD-10-CM

## 2018-05-16 NOTE — Patient Instructions (Signed)
Medication Instructions:  Your physician recommends that you continue on your current medications as directed. Please refer to the Current Medication list given to you today.  If you need a refill on your cardiac medications before your next appointment, please call your pharmacy.   Lab work: You will have lab work today:  BMP and ProBNP If you have labs (blood work) drawn today and your tests are completely normal, you will receive your results only by: Marland Kitchen MyChart Message (if you have MyChart) OR . A paper copy in the mail If you have any lab test that is abnormal or we need to change your treatment, we will call you to review the results.  Testing/Procedures: Your physician has requested that you have an echocardiogram. Echocardiography is a painless test that uses sound waves to create images of your heart. It provides your doctor with information about the size and shape of your heart and how well your heart's chambers and valves are working. This procedure takes approximately one hour. There are no restrictions for this procedure.    Follow-Up: At [facility], you and your health needs are our priority.  As part of our continuing mission to provide you with exceptional heart care, we have created designated Provider Care Teams.  These Care Teams include your primary Cardiologist (physician) and Advanced Practice Providers (APPs -  Physician Assistants and Nurse Practitioners) who all work together to provide you with the care you need, when you need it. . You will need a follow up appointment in 4 weeks  Any Other Special Instructions Will Be Listed Below (If Applicable).

## 2018-05-17 NOTE — Addendum Note (Signed)
Addended by: Tarri Glenn on: 05/17/2018 02:20 PM   Modules accepted: Orders

## 2018-05-18 LAB — BASIC METABOLIC PANEL
BUN/Creatinine Ratio: 17 (ref 10–24)
BUN: 17 mg/dL (ref 8–27)
CO2: 25 mmol/L (ref 20–29)
Calcium: 8.9 mg/dL (ref 8.6–10.2)
Chloride: 104 mmol/L (ref 96–106)
Creatinine, Ser: 1.02 mg/dL (ref 0.76–1.27)
GFR calc Af Amer: 83 mL/min/{1.73_m2} (ref >59)
GFR calc non Af Amer: 72 mL/min/{1.73_m2} (ref >59)
Glucose: 112 mg/dL — ABNORMAL HIGH (ref 65–99)
Potassium: 4.4 mmol/L (ref 3.5–5.2)
Sodium: 143 mmol/L (ref 134–144)

## 2018-05-18 LAB — PRO B NATRIURETIC PEPTIDE: NT-Pro BNP: 756 pg/mL — ABNORMAL HIGH (ref 0–376)

## 2018-05-30 ENCOUNTER — Encounter: Payer: Self-pay | Admitting: Neurology

## 2018-06-04 ENCOUNTER — Other Ambulatory Visit: Payer: Self-pay

## 2018-06-04 ENCOUNTER — Ambulatory Visit (HOSPITAL_BASED_OUTPATIENT_CLINIC_OR_DEPARTMENT_OTHER)
Admission: RE | Admit: 2018-06-04 | Discharge: 2018-06-04 | Disposition: A | Discharge status: Home / Self Care | Payer: Medicare Other | Source: Ambulatory Visit | Attending: Cardiology | Admitting: Cardiology

## 2018-06-04 DIAGNOSIS — Z951 Presence of aortocoronary bypass graft: Secondary | ICD-10-CM | POA: Insufficient documentation

## 2018-06-04 DIAGNOSIS — R0602 Shortness of breath: Secondary | ICD-10-CM

## 2018-06-04 DIAGNOSIS — I252 Old myocardial infarction: Secondary | ICD-10-CM | POA: Diagnosis not present

## 2018-06-04 DIAGNOSIS — G459 Transient cerebral ischemic attack, unspecified: Secondary | ICD-10-CM | POA: Insufficient documentation

## 2018-06-04 DIAGNOSIS — I25119 Atherosclerotic heart disease of native coronary artery with unspecified angina pectoris: Secondary | ICD-10-CM | POA: Diagnosis not present

## 2018-06-04 DIAGNOSIS — I351 Nonrheumatic aortic (valve) insufficiency: Secondary | ICD-10-CM | POA: Diagnosis not present

## 2018-06-04 DIAGNOSIS — I10 Essential (primary) hypertension: Secondary | ICD-10-CM | POA: Insufficient documentation

## 2018-06-04 DIAGNOSIS — E785 Hyperlipidemia, unspecified: Secondary | ICD-10-CM | POA: Insufficient documentation

## 2018-06-04 NOTE — Progress Notes (Signed)
  Echocardiogram 2D Echocardiogram has been performed.  Shawn Meza 06/04/2018, 1:36 PM

## 2018-06-04 NOTE — Progress Notes (Signed)
Shawn Meza was seen today in the movement disorders clinic for neurologic consultation at the request of Serita Grammes, MD.  The consultation is for the evaluation of Parkinson's disease.  Patient was previously under the care of Dr. Jaynee Eagles.  I have reviewed her records.  He started to see her in December, 2016, at which point the complaints were memory change.  His Moca was 24/30 at the time.  She felt that his depression was contributing to his memory change.  She encouraged he restart the Prozac.  He was started on donepezil, which he reports caused him to be mean. He was then not seen again until May, 2018.  At that point, he reported improvement in memory with treatment for his depression.  He was back to see Dr. Jaynee Eagles in May, 2018 because of tremor in the left hand that had started 6 to 8 months prior.  She felt he had parkinsonian symptoms.  She recommended levodopa and recommended a DaTscan.  DaTscan was completed on Dec 20, 2016 demonstrating decreased activity in the right putamen compared to that of the left.  She saw him back in follow-up in June, 2018 and the patient was only able to take half a pill and was still finding himself sleepy per records but pt states that it caused GI upset.  Therefore, it was discontinued and ropinirole was initiated and work to 1 mg 3 times per day.  Pt was worried about SE.  He took the titration dose for a week and ultimately d/c it.     Specific Symptoms:  Tremor: Yes.  , L hand and rarely in the L leg Family hx of similar:  Yes.  , brother with "shaking" but hes never been to dr Voice: weaker and more hoarse Sleep: gets to bed late and then trouble getting up in the AM  Vivid Dreams:  No.  Acting out dreams:  No. Wet Pillows: Yes.   Postural symptoms:  Yes.   , better after rehab at [facility]?  No. Bradykinesia symptoms: shuffling gait, slow movements and difficulty getting out of a chair Loss of smell:  No. Loss of taste:   No. Urinary Incontinence:  No., had frequency but better with medication Difficulty Swallowing:  No. Handwriting, micrographia: No. Trouble with ADL's:  No. but hes slower  Trouble buttoning clothing: No. Depression:  Yes.   but better lately Memory changes:  Yes.   Hallucinations:  No. (only one time post op)  visual distortions: No. N/V:  No. Lightheaded:  No.  Syncope: No. Diplopia:  No. Dyskinesia:  No. Prior exposure to reglan/antipsychotics: No.    PREVIOUS MEDICATIONS: Sinemet and Requip  ALLERGIES:   Allergies  Allergen Reactions  . Testosterone Other (See Comments)    ABDOMINAL PAIN and cramping  . Fluoxetine Other (See Comments)    Caused depression and aggression  . Other Other (See Comments)    Patient has a hiatal hernia and cannot tolerate heavy food, chocolate, fried foods, peanuts, and greasy or spicy foods  . Requip [Ropinirole] Nausea Only    CURRENT MEDICATIONS:  Outpatient Encounter Medications as of 06/06/2018  Medication Sig  . aspirin 81 MG tablet Take 81 mg by mouth daily.   Marland Kitchen atorvastatin (LIPITOR) 20 MG tablet Take 1 tablet (20 mg total) by mouth daily. (Patient taking differently: Take 20 mg by mouth daily at 6 PM. )  . clopidogrel (PLAVIX) 75 MG tablet TAKE 1 TABLET (75 MG TOTAL) BY MOUTH  ONCE DAILY. (Patient taking differently: Take 75 mg by mouth daily. )  . erythromycin ophthalmic ointment Place 1 application into the right eye 3 (three) times daily. Put a thin strip on the lower eyelid 3 times per day.  . escitalopram (LEXAPRO) 5 MG tablet Take 5 mg by mouth daily.   . fluocinonide-emollient (LIDEX-E) 0.05 % cream Apply 1 application topically 2 (two) times daily. (Patient taking differently: Apply 1 application topically 2 (two) times daily as needed (for itching). )  . meloxicam (MOBIC) 7.5 MG tablet TAKE 1 TABLET BY MOUTH ONCE DAILY AS NEEDED (Patient taking differently: Take 7.5 mg by mouth once a day)  . metoprolol tartrate (LOPRESSOR) 25  MG tablet TAKE 1/2 TABLET BY MOUTH TWICE DAILY  . Multiple Vitamin (MULTI-VITAMINS) TABS Take 0.5 tablets by mouth daily.   Marland Kitchen olopatadine (PATANOL) 0.1 % ophthalmic solution Place 1 drop into both eyes daily as needed for irritation.  . pantoprazole (PROTONIX) 40 MG tablet Take 40 mg by mouth daily.  . tamsulosin (FLOMAX) 0.4 MG CAPS capsule Take 0.4 mg by mouth daily as needed (to relax the prostate).   . nitroGLYCERIN (NITROSTAT) 0.4 MG SL tablet Place 1 tablet (0.4 mg total) under the tongue every 5 (five) minutes as needed. Chest pain (Patient not taking: Reported on 06/06/2018)   No facility-administered encounter medications on file as of 06/06/2018.     PAST MEDICAL HISTORY:   Past Medical History:  Diagnosis Date  . Arthritis   . BPH (benign prostatic hypertrophy)   . CAD (coronary artery disease)   . Colon cancer ([place])   . Elevated PSA   . Erectile dysfunction   . Essential tremor   . GERD (gastroesophageal reflux disease)   . Headache(784.0)   . Hiatal hernia   . Hyperlipidemia   . Hypertension   . Major depression, chronic   . Medial meniscus tear 10/11/2011  . Metabolic syndrome   . Nephrolithiasis    hx of  . Transient ischemic attack    hx of  . Trochanteric bursitis of right hip     PAST SURGICAL HISTORY:   Past Surgical History:  Procedure Laterality Date  . CARDIAC CATHETERIZATION  5/12,1/13   4 stents placed  . COLON SURGERY    . KNEE ARTHROSCOPY  10/11/2011   Procedure: ARTHROSCOPY KNEE;  Surgeon: Lorn Junes, MD;  Location: [facility];  Service: Orthopedics;  Laterality: Left;  Left Knee Arthroscopy with Medial and Lateral Partial Menisectomy, Chondroplasty  . LEFT HEART CATHETERIZATION WITH CORONARY ANGIOGRAM N/A 08/25/2011   Procedure: LEFT HEART CATHETERIZATION WITH CORONARY ANGIOGRAM;  Surgeon: Burnell Blanks, MD;  Location[facility] CATH LAB;  Service: Cardiovascular;  Laterality: N/A;  . STERIOD INJECTION  10/11/2011   Procedure:  STEROID INJECTION;  Surgeon: Lorn Junes, MD;  Location: [place];  Service: Orthopedics;  Laterality: Right;  Steroid Injection Second Toe  . TRANSURETHRAL RESECTION OF PROSTATE      SOCIAL HISTORY:   Social History   Socioeconomic History  . Marital status: Married    Spouse name: Tammie  . Number of children: 1  . Years of education: 28  . Highest education level: Not on file  Occupational History  . 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 Financial resource strain: Not on file  . Food insecurity:    Worry: Not on file    Inability: Not on file  . Transportation needs:    Medical: Not on file  Non-medical: Not on file  Tobacco Use  . Smoking status: Never Smoker  . Smokeless tobacco: Never Used  Substance and Sexual Activity  . Alcohol use: No  . Drug use: No  . Sexual activity: Not on file  Lifestyle  . Physical activity:    Days per week: Not on file    Minutes per session: Not on file  . Stress: Not on file  Relationships  . Social connections:    Talks on phone: Not on file    Gets together: Not on file    Attends religious service: Not on file    Active member of club or organization: Not on file    Attends meetings of clubs or organizations: Not on file    Relationship status: Not on file  . Intimate partner violence:    Fear of current or ex partner: Not on file    Emotionally abused: Not on file    Physically abused: Not on file    Forced sexual activity: Not on file  Other Topics Concern  . Not on file  Social History Narrative   Lives at home with wife.   Married.   Caffeine use: none        FAMILY HISTORY:   Family Status  Relation Name Status  . Mother  Deceased at age 12  . Father  Deceased at age 27  . Brother  Alive  . Brother  Alive  . Other  (Not Specified)  . Unknown  (Not Specified)  . Unknown  (Not Specified)  . Unknown  (Not Specified)  . Neg Hx  (Not Specified)    ROS:  Review of Systems    Constitutional: Negative.   HENT: Negative.   Eyes: Negative.   Respiratory: Negative.   Cardiovascular: Negative.   Gastrointestinal: Positive for heartburn.  Genitourinary: Positive for frequency.  Musculoskeletal: Negative.   Skin: Negative.   Endo/Heme/Allergies: Negative.     PHYSICAL EXAMINATION:    VITALS:   Vitals:   06/06/18 1007  BP: 114/66  Pulse: 78  SpO2: 97%  Weight: 215 lb (97.5 kg)  Height: 5\' 6"  (1.676 m)    GEN:  The patient appears stated age and is in NAD. HEENT:  Normocephalic, atraumatic.  The mucous membranes are moist. The superficial temporal arteries are without ropiness or tenderness. CV:  RRR Lungs:  CTAB Neck/HEME:  There are no carotid bruits bilaterally.  Neurological examination:  Orientation: The patient is alert and oriented x3. Fund of knowledge is appropriate.  Recent and remote memory are intact.  Attention and concentration are normal.    Able to name objects and repeat phrases. Cranial nerves: There is good facial symmetry. Pupils are equal round and reactive to light bilaterally. Fundoscopic exam reveals clear margins bilaterally. Extraocular muscles are intact. The visual fields are full to confrontational testing. The speech is fluent and clear. Soft palate rises symmetrically and there is no tongue deviation. Hearing is intact to conversational tone. Sensation: Sensation is intact to light and pinprick throughout (facial, trunk, extremities). Vibration is intact at the bilateral big toe. There is no extinction with double simultaneous stimulation. There is no sensory dermatomal level identified. Motor: Strength is 5/5 in the bilateral upper and lower extremities.   Shoulder shrug is equal and symmetric.  There is no pronator drift. Deep tendon reflexes: Deep tendon reflexes are 2+-3/4 at the bilateral biceps, triceps, brachioradialis, patella and trace at the bilateral achilles. Plantar responses are downgoing bilaterally.  Movement  examination: Tone: There is normal tone in the bilateral upper extremities.  There is mild increased tone in the LLE.  There is normal tone in the RLE Abnormal movements: there is LUE resting tremor.  There is bilateral lower extremity rest tremor Coordination:  There is  decremation with RAM's, with any form of RAMS, including alternating supination and pronation of the forearm, hand opening and closing, finger taps, heel taps and toe taps on the left Gait and Station: The patient has no difficulty arising out of a deep-seated chair without the use of the hands. The patient's stride length is normal.  The patient has a neg pull test.      Labs: Lab work is completed on March 20, 2018.  Hemoglobin A1c was 5.6.  B12 was 311.  White blood cells were 8.8, hemoglobin 13.5, hematocrit 41, platelets 263.  TSH in May, 2019 was 2.004.  In February, 2019, sodium was 141, potassium 4.0, chloride 108, CO2 27, BUN 20 and creatinine 1.1.  Glucose is 104.  AST 22, ALT 26.  ASSESSMENT/PLAN:  1.  Idiopathic Parkinson's disease.  The patient has tremor, bradykinesia, rigidity and mild postural instability.  -We discussed the diagnosis as well as pathophysiology of the disease.  We discussed treatment options as well as prognostic indicators.  Patient education was provided.  -We discussed that it used to be thought that levodopa would increase risk of melanoma but now it is believed that Parkinsons itself likely increases risk of melanoma. he is to get regular skin checks.  -Greater than 50% of the 60 minute visit was spent in counseling answering questions and talking about what to expect now as well as in the future.  We talked about medication options as well as potential future surgical options.  We talked about safety in the home.  -Pt is very leery about medication.  If we tried medicine, we would have to try the extended release form of levodopa, primarily because of the fact that he had nausea with immediate  release, but also the fact that he is convinced he will have side effects with further medication.  He ultimately decided to hold off on further medication for right now.  -he has just finished PT at [facility].  Encouraged safe, cardiovascular exercise, but he would need approval from the cardiologist.  -We discussed community resources in the area including patient support groups and community exercise programs for PD and pt education was provided to the patient.  2.  B12 deficiency  -Recommend the patient take oral B12, 1000 mcg daily  3.  Depression  -Patient under the care of primary care.  He just started Lexapro and trazodone on April 30, 2018.  Would recommend counseling.  He has seen a psychologist in the past and has found it helpful.  4.  Hyperreflexia  -has no neck or back pain so held on further neuroimaging.  In addition, neuro exam non focal and nonlateralizing besides for PD exam  5.  Follow up is anticipated in the next 6 months, sooner should new neurologic issues arise.  Much greater than 50% of this visit was spent in counseling and coordinating care.  Total face to face time:  60 min  Cc:  Cyndy Freeze, MD

## 2018-06-06 ENCOUNTER — Ambulatory Visit (INDEPENDENT_AMBULATORY_CARE_PROVIDER_SITE_OTHER): Payer: Medicare Other | Admitting: Neurology

## 2018-06-06 ENCOUNTER — Encounter: Payer: Self-pay | Admitting: Neurology

## 2018-06-06 VITALS — BP 114/66 | HR 78 | Ht 66.0 in | Wt 215.0 lb

## 2018-06-06 DIAGNOSIS — G20 Parkinson's disease: Secondary | ICD-10-CM | POA: Diagnosis not present

## 2018-06-06 DIAGNOSIS — E538 Deficiency of other specified B group vitamins: Secondary | ICD-10-CM | POA: Diagnosis not present

## 2018-06-06 DIAGNOSIS — F331 Major depressive disorder, recurrent, moderate: Secondary | ICD-10-CM | POA: Diagnosis not present

## 2018-06-06 NOTE — Patient Instructions (Signed)
1.  Start oral B12, 1000 mcg daily

## 2018-06-13 ENCOUNTER — Ambulatory Visit (INDEPENDENT_AMBULATORY_CARE_PROVIDER_SITE_OTHER): Payer: Medicare Other | Admitting: Cardiology

## 2018-06-13 ENCOUNTER — Encounter: Payer: Self-pay | Admitting: Cardiology

## 2018-06-13 VITALS — BP 112/62 | HR 70 | Ht 66.0 in | Wt 216.0 lb

## 2018-06-13 DIAGNOSIS — Z951 Presence of aortocoronary bypass graft: Secondary | ICD-10-CM

## 2018-06-13 DIAGNOSIS — I25119 Atherosclerotic heart disease of native coronary artery with unspecified angina pectoris: Secondary | ICD-10-CM | POA: Diagnosis not present

## 2018-06-13 DIAGNOSIS — Z01818 Encounter for other preprocedural examination: Secondary | ICD-10-CM | POA: Diagnosis not present

## 2018-06-13 DIAGNOSIS — I429 Cardiomyopathy, unspecified: Secondary | ICD-10-CM

## 2018-06-13 DIAGNOSIS — I10 Essential (primary) hypertension: Secondary | ICD-10-CM | POA: Diagnosis not present

## 2018-06-13 DIAGNOSIS — E782 Mixed hyperlipidemia: Secondary | ICD-10-CM

## 2018-06-13 HISTORY — DX: Cardiomyopathy, unspecified: I42.9

## 2018-06-13 NOTE — Progress Notes (Signed)
Cardiology Office Note:    Date:  06/13/2018   ID:  Shawn Meza, DOB 1946-07-10, MRN [id number]  PCP:  Cyndy Freeze, MD  Cardiologist:  Jenean Lindau, MD   Referring MD: Cyndy Freeze, MD    ASSESSMENT:    1. Pre-operative clearance   2. Coronary artery disease involving native coronary artery of native heart with angina pectoris ([place])    PLAN:    In order of problems listed above:  1. Secondary prevention stressed with the patient.  Importance of compliance with diet and medication stressed and he vocalized understanding.  His blood pressure is stable and borderline.  Diet was discussed for dyslipidemia.  Weight reduction was stressed. 2. Patient's ejection fraction is moderately depressed.  I do not have him on ACE inhibitor at this time as his blood pressure is borderline and he has Parkinson's and I am afraid that this may contribute to hypotension and a fall which will be devastating in this gentleman.  At this time I will do a exercise stress Cardiolite in view of abnormal ejection fraction.  If this is negative then he is not at high risk for coronary events during the aforementioned surgery.  Meticulous hemodynamic monitoring and continued perioperative beta-blockade will further reduce the risk of coronary events. 3. I will see him after his surgery to reassess his systolic function and also consider therapy such as ACE inhibition.   Medication Adjustments/Labs and Tests Ordered: Current medicines are reviewed at length with the patient today.  Concerns regarding medicines are outlined above.  Orders Placed This Encounter  Procedures  . MYOCARDIAL PERFUSION IMAGING  . EKG 12-Lead   No orders of the defined types were placed in this encounter.    No chief complaint on file.    History of Present Illness:    Shawn Meza is a 72 y.o. male.  Patient has history of coronary artery disease.  He underwent CABG surgery.  Subsequently his ejection fraction was  mildly depressed.  He denies any problems at this time and takes care of activities of daily living.  He leads a sedentary lifestyle.  No chest pain orthopnea or PND.  Recently saw my partner for preop evaluation and his ejection fraction is further depressed.  At the time of my evaluation, the patient is alert awake oriented and in no distress.  He is planning to undergo abdominal hernia repair surgery.  Past Medical History:  Diagnosis Date  . Arthritis   . BPH (benign prostatic hypertrophy)   . CAD (coronary artery disease)   . Colon cancer ([place])   . Elevated PSA   . Erectile dysfunction   . Essential tremor   . GERD (gastroesophageal reflux disease)   . Headache(784.0)   . Hiatal hernia   . Hyperlipidemia   . Hypertension   . Major depression, chronic   . Medial meniscus tear 10/11/2011  . Metabolic syndrome   . Nephrolithiasis    hx of  . Transient ischemic attack    hx of  . Trochanteric bursitis of right hip     Past Surgical History:  Procedure Laterality Date  . CARDIAC CATHETERIZATION  5/12,1/13   4 stents placed  . COLON SURGERY    . KNEE ARTHROSCOPY  10/11/2011   Procedure: ARTHROSCOPY KNEE;  Surgeon: Lorn Junes, MD;  Location: [place];  Service: Orthopedics;  Laterality: Left;  Left Knee Arthroscopy with Medial and Lateral Partial Menisectomy, Chondroplasty  . LEFT HEART CATHETERIZATION WITH  CORONARY ANGIOGRAM N/A 08/25/2011   Procedure: LEFT HEART CATHETERIZATION WITH CORONARY ANGIOGRAM;  Surgeon: Burnell Blanks, MD;  Location[facility];  Service: Cardiovascular;  Laterality: N/A;  . STERIOD INJECTION  10/11/2011   Procedure: STEROID INJECTION;  Surgeon: Lorn Junes, MD;  Location: [place];  Service: Orthopedics;  Laterality: Right;  Steroid Injection Second Toe  . TRANSURETHRAL RESECTION OF PROSTATE      Current Medications: Current Meds  Medication Sig  . aspirin 81 MG tablet Take 81 mg by mouth daily.   Marland Kitchen  atorvastatin (LIPITOR) 20 MG tablet Take 1 tablet (20 mg total) by mouth daily. (Patient taking differently: Take 20 mg by mouth daily at 6 PM. )  . clopidogrel (PLAVIX) 75 MG tablet TAKE 1 TABLET (75 MG TOTAL) BY MOUTH ONCE DAILY. (Patient taking differently: Take 75 mg by mouth daily. )  . erythromycin ophthalmic ointment Place 1 application into the right eye 3 (three) times daily. Put a thin strip on the lower eyelid 3 times per day.  . escitalopram (LEXAPRO) 5 MG tablet Take 5 mg by mouth daily.   . fluocinonide-emollient (LIDEX-E) 0.05 % cream Apply 1 application topically 2 (two) times daily. (Patient taking differently: Apply 1 application topically 2 (two) times daily as needed (for itching). )  . meloxicam (MOBIC) 7.5 MG tablet TAKE 1 TABLET BY MOUTH ONCE DAILY AS NEEDED (Patient taking differently: Take 7.5 mg by mouth once a day)  . metoprolol tartrate (LOPRESSOR) 25 MG tablet TAKE 1/2 TABLET BY MOUTH TWICE DAILY  . Multiple Vitamin (MULTI-VITAMINS) TABS Take 0.5 tablets by mouth daily.   . nitroGLYCERIN (NITROSTAT) 0.4 MG SL tablet Place 1 tablet (0.4 mg total) under the tongue every 5 (five) minutes as needed. Chest pain  . olopatadine (PATANOL) 0.1 % ophthalmic solution Place 1 drop into both eyes daily as needed for irritation.  . pantoprazole (PROTONIX) 40 MG tablet Take 40 mg by mouth daily.  . tamsulosin (FLOMAX) 0.4 MG CAPS capsule Take 0.4 mg by mouth daily as needed (to relax the prostate).      Allergies:   Testosterone; Fluoxetine; Other; and Requip [ropinirole]   Social History   Socioeconomic History  . Marital status: Married    Spouse name: Tammie  . Number of children: 1  . Years of education: 66  . Highest education level: Not on file  Occupational History  . 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 Financial resource strain: Not on file  . Food insecurity:    Worry: Not on file    Inability: Not on file  . Transportation needs:     Medical: Not on file    Non-medical: Not on file  Tobacco Use  . Smoking status: Never Smoker  . Smokeless tobacco: Never Used  Substance and Sexual Activity  . Alcohol use: No  . Drug use: No  . Sexual activity: Not on file  Lifestyle  . Physical activity:    Days per week: Not on file    Minutes per session: Not on file  . Stress: Not on file  Relationships  . Social connections:    Talks on phone: Not on file    Gets together: Not on file    Attends religious service: Not on file    Active member of club or organization: Not on file    Attends meetings of clubs or organizations: Not on file    Relationship status: Not  on file  Other Topics Concern  . Not on file  Social History Narrative   Lives at home with wife.   Married.   Caffeine use: none         Family History: The patient's family history includes Arthritis in his unknown relative; Coronary artery disease in his other; Heart disease in his brother, father, and mother; Hyperlipidemia in his brother, father, and unknown relative; Hypertension in his unknown relative; Stroke in his father. There is no history of Dementia.  ROS:   Please see the history of present illness.    All other systems reviewed and are negative.  EKGs/Labs/Other Studies Reviewed:    The following studies were reviewed today: EKG reveals sinus rhythm, intraventricular conduction delay and old septal myocardial infarction.   Recent Labs: 01/22/2018: ALT 13; Hemoglobin 13.9; Platelets 189; TSH 3.210 05/17/2018: BUN 17; Creatinine, Ser 1.02; NT-Pro BNP 756; Potassium 4.4; Sodium 143  Recent Lipid Panel    Component Value Date/Time   CHOL 118 01/22/2018 0905   TRIG 98 01/22/2018 0905   TRIG 107 05/09/2010   HDL 40 01/22/2018 0905   CHOLHDL 3.0 01/22/2018 0905   CHOLHDL 3.6 05/20/2015 1410   VLDL 27 05/20/2015 1410   LDLCALC 58 01/22/2018 0905    Physical Exam:    VS:  BP 112/62 (BP Location: Right Arm, Patient Position:  Sitting, Cuff Size: Normal)   Pulse 70   Ht 5\' 6"  (1.676 m)   Wt 216 lb (98 kg)   SpO2 99%   BMI 34.86 kg/m     Wt Readings from Last 3 Encounters:  06/13/18 216 lb (98 kg)  06/06/18 215 lb (97.5 kg)  05/16/18 215 lb 1.9 oz (97.6 kg)     GEN: Patient is in no acute distress HEENT: Normal NECK: No JVD; No carotid bruits LYMPHATICS: No lymphadenopathy CARDIAC: Hear sounds regular, 2/6 systolic murmur at the apex. RESPIRATORY:  Clear to auscultation without rales, wheezing or rhonchi  ABDOMEN: Soft, non-tender, non-distended MUSCULOSKELETAL:  No edema; No deformity  SKIN: Warm and dry NEUROLOGIC:  Alert and oriented x 3 PSYCHIATRIC:  Normal affect   Signed, Jenean Lindau, MD  06/13/2018 10:34 AM    [place]

## 2018-06-13 NOTE — Patient Instructions (Addendum)
Medication Instructions:  Your physician recommends that you continue on your current medications as directed. Please refer to the Current Medication list given to you today.  If you need a refill on your cardiac medications before your next appointment, please call your pharmacy.   Lab work: None  If you have labs (blood work) drawn today and your tests are completely normal, you will receive your results only by: Marland Kitchen MyChart Message (if you have MyChart) OR . A paper copy in the mail If you have any lab test that is abnormal or we need to change your treatment, we will call you to review the results.  Testing/Procedures: Your physician has requested that you have en exercise stress myoview. For further information please visit [email or web address]. Please follow instruction sheet, as given.   Follow-Up: At [facility], you and your health needs are our priority.  As part of our continuing mission to provide you with exceptional heart care, we have created designated Provider Care Teams.  These Care Teams include your primary Cardiologist (physician) and Advanced Practice Providers (APPs -  Physician Assistants and Nurse Practitioners) who all work together to provide you with the care you need, when you need it.  You will need a follow up appointment in 2 months.  Please call our office 2 months in advance to schedule this appointment.  You may see another member of our [employer] Provider Team in [place]: [name], MD . Shirlee More, MD  Any Other Special Instructions Will Be Listed Below (If Applicable).

## 2018-07-05 ENCOUNTER — Other Ambulatory Visit: Payer: Self-pay

## 2018-07-26 ENCOUNTER — Telehealth (HOSPITAL_COMMUNITY): Payer: Self-pay | Admitting: *Deleted

## 2018-07-26 NOTE — Telephone Encounter (Signed)
Left message on voicemail per DPR in reference to upcoming appointment scheduled on 07/31/18 with detailed instructions given per Myocardial Perfusion Study Information Sheet for the test. LM to arrive 15 minutes early, and that it is imperative to arrive on time for appointment to keep from having the test rescheduled. If you need to cancel or reschedule your appointment, please call the office within 24 hours of your appointment. Failure to do so may result in a cancellation of your appointment, and a $50 no show fee. Phone number given for call back for any questions. Kirstie Peri

## 2018-07-31 ENCOUNTER — Ambulatory Visit (INDEPENDENT_AMBULATORY_CARE_PROVIDER_SITE_OTHER): Payer: Medicare Other

## 2018-07-31 VITALS — Ht 66.0 in | Wt 216.0 lb

## 2018-07-31 DIAGNOSIS — R0602 Shortness of breath: Secondary | ICD-10-CM | POA: Diagnosis not present

## 2018-07-31 DIAGNOSIS — Z951 Presence of aortocoronary bypass graft: Secondary | ICD-10-CM

## 2018-07-31 DIAGNOSIS — Z01818 Encounter for other preprocedural examination: Secondary | ICD-10-CM

## 2018-07-31 DIAGNOSIS — I25119 Atherosclerotic heart disease of native coronary artery with unspecified angina pectoris: Secondary | ICD-10-CM | POA: Diagnosis not present

## 2018-07-31 LAB — MYOCARDIAL PERFUSION IMAGING
LV dias vol: 166 mL (ref 62–150)
LV sys vol: 121 mL
Peak HR: 100 {beats}/min
Rest HR: 79 {beats}/min
SDS: 7
SRS: 9
SSS: 16
TID: 1.11

## 2018-07-31 MED ORDER — REGADENOSON 0.4 MG/5ML IV SOLN
0.4000 mg | Freq: Once | INTRAVENOUS | Status: AC
  Administered 2018-07-31: 0.4 mg via INTRAVENOUS

## 2018-07-31 MED ORDER — TECHNETIUM TC 99M TETROFOSMIN IV KIT
11.0000 | PACK | Freq: Once | INTRAVENOUS | Status: AC | PRN
  Administered 2018-07-31: 11 via INTRAVENOUS

## 2018-07-31 MED ORDER — TECHNETIUM TC 99M TETROFOSMIN IV KIT
32.1000 | PACK | Freq: Once | INTRAVENOUS | Status: AC | PRN
  Administered 2018-07-31: 32.1 via INTRAVENOUS

## 2018-08-02 ENCOUNTER — Telehealth: Payer: Self-pay

## 2018-08-02 NOTE — Telephone Encounter (Signed)
Patient returned called to inform our office of the surgeon's name doing his surgery.  Dr Orrin Brigham in [place] will be doing patients hernia repair. Copy of nuclear stress test and last office note routed to Dr Lilia Pro.

## 2018-08-13 ENCOUNTER — Ambulatory Visit (INDEPENDENT_AMBULATORY_CARE_PROVIDER_SITE_OTHER): Payer: Medicare Other | Admitting: Cardiology

## 2018-08-13 ENCOUNTER — Encounter: Payer: Self-pay | Admitting: Cardiology

## 2018-08-13 VITALS — BP 118/76 | HR 79 | Ht 66.0 in | Wt 217.6 lb

## 2018-08-13 DIAGNOSIS — I25119 Atherosclerotic heart disease of native coronary artery with unspecified angina pectoris: Secondary | ICD-10-CM

## 2018-08-13 DIAGNOSIS — I255 Ischemic cardiomyopathy: Secondary | ICD-10-CM

## 2018-08-13 DIAGNOSIS — I10 Essential (primary) hypertension: Secondary | ICD-10-CM

## 2018-08-13 DIAGNOSIS — E782 Mixed hyperlipidemia: Secondary | ICD-10-CM | POA: Diagnosis not present

## 2018-08-13 DIAGNOSIS — E785 Hyperlipidemia, unspecified: Secondary | ICD-10-CM

## 2018-08-13 NOTE — Progress Notes (Signed)
Cardiology Office Note:    Date:  08/13/2018   ID:  Shawn Meza, DOB 19-Aug-1945, MRN [id number]  PCP:  Cyndy Freeze, MD  Cardiologist:  Jenean Lindau, MD   Referring MD: Cyndy Freeze, MD    ASSESSMENT:    1. Mixed hyperlipidemia   2. Essential hypertension   3. Coronary artery disease involving native coronary artery of native heart with angina pectoris ([place])   4. Ischemic cardiomyopathy   5. Dyslipidemia    PLAN:    In order of problems listed above:  1. Secondary prevention stressed with the patient.  Importance of compliance with diet and medication stressed and he vocalized understanding.  Diet was discussed for dyslipidemia and obesity and weight reduction was stressed. 2. In view of the aforementioned tests, I think patient is at moderate risk for cardiovascular events during the aforementioned surgery.  Meticulous hemodynamic monitoring and continued perioperative beta-blockade will further reduce the risk of coronary events.  I advised him that he could stop his Plavix as recommended by surgeon and restart it again again by the doctors recommendations.  He vocalized understanding.  I would prefer he be on uninterrupted aspirin.  Lipids are followed by primary care physician. 3. Patient will be seen in follow-up appointment in 6 months or earlier if the patient has any concerns    Medication Adjustments/Labs and Tests Ordered: Current medicines are reviewed at length with the patient today.  Concerns regarding medicines are outlined above.  No orders of the defined types were placed in this encounter.  No orders of the defined types were placed in this encounter.    Chief Complaint  Patient presents with  . Follow-up    follow up with stress test for cardiac clearance.      History of Present Illness:    EMILLIANO Meza is a 73 y.o. male.  Patient has known coronary artery disease and has undergone CABG surgery early last year.  He denies any problems at  this time.  He leads a sedentary lifestyle is planning to undergo hernia surgery.  His stress testing and echocardiogram revealed moderately depressed systolic function with significant amount of area which appears to be a scar tissue.  No evidence of ischemia.  Patient is here for follow-up.  He is on dual antiplatelet therapy for unclear reasons.  Past Medical History:  Diagnosis Date  . Arthritis   . BPH (benign prostatic hypertrophy)   . CAD (coronary artery disease)   . Colon cancer ([place])   . Elevated PSA   . Erectile dysfunction   . Essential tremor   . GERD (gastroesophageal reflux disease)   . Headache(784.0)   . Hiatal hernia   . Hyperlipidemia   . Hypertension   . Major depression, chronic   . Medial meniscus tear 10/11/2011  . Metabolic syndrome   . Nephrolithiasis    hx of  . Transient ischemic attack    hx of  . Trochanteric bursitis of right hip     Past Surgical History:  Procedure Laterality Date  . CARDIAC CATHETERIZATION  5/12,1/13   4 stents placed  . COLON SURGERY    . KNEE ARTHROSCOPY  10/11/2011   Procedure: ARTHROSCOPY KNEE;  Surgeon: Lorn Junes, MD;  Location: [place];  Service: Orthopedics;  Laterality: Left;  Left Knee Arthroscopy with Medial and Lateral Partial Menisectomy, Chondroplasty  . LEFT HEART CATHETERIZATION WITH CORONARY ANGIOGRAM N/A 08/25/2011   Procedure: LEFT HEART CATHETERIZATION WITH CORONARY ANGIOGRAM;  Surgeon: Burnell Blanks, MD;  Location: [facility] CATH LAB;  Service: Cardiovascular;  Laterality: N/A;  . STERIOD INJECTION  10/11/2011   Procedure: STEROID INJECTION;  Surgeon: Lorn Junes, MD;  Location: [place];  Service: Orthopedics;  Laterality: Right;  Steroid Injection Second Toe  . TRANSURETHRAL RESECTION OF PROSTATE      Current Medications: Current Meds  Medication Sig  . aspirin 81 MG tablet Take 81 mg by mouth daily.   Marland Kitchen atorvastatin (LIPITOR) 20 MG tablet Take 1 tablet (20 mg  total) by mouth daily. (Patient taking differently: Take 20 mg by mouth daily at 6 PM. )  . clopidogrel (PLAVIX) 75 MG tablet TAKE 1 TABLET (75 MG TOTAL) BY MOUTH ONCE DAILY. (Patient taking differently: Take 75 mg by mouth daily. )  . escitalopram (LEXAPRO) 5 MG tablet Take 5 mg by mouth daily.   . fluocinonide-emollient (LIDEX-E) 0.05 % cream Apply 1 application topically 2 (two) times daily. (Patient taking differently: Apply 1 application topically 2 (two) times daily as needed (for itching). )  . meloxicam (MOBIC) 7.5 MG tablet TAKE 1 TABLET BY MOUTH ONCE DAILY AS NEEDED (Patient taking differently: Take 7.5 mg by mouth once a day)  . metoprolol tartrate (LOPRESSOR) 25 MG tablet TAKE 1/2 TABLET BY MOUTH TWICE DAILY  . Multiple Vitamin (MULTI-VITAMINS) TABS Take 0.5 tablets by mouth daily.   . nitroGLYCERIN (NITROSTAT) 0.4 MG SL tablet Place 1 tablet (0.4 mg total) under the tongue every 5 (five) minutes as needed. Chest pain  . pantoprazole (PROTONIX) 40 MG tablet Take 40 mg by mouth daily.  . tamsulosin (FLOMAX) 0.4 MG CAPS capsule Take 0.4 mg by mouth daily as needed (to relax the prostate).      Allergies:   Testosterone; Fluoxetine; Other; and Requip [ropinirole]   Social History   Socioeconomic History  . Marital status: Married    Spouse name: Tammie  . Number of children: 1  . Years of education: 15  . Highest education level: Not on file  Occupational History  . 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 Financial resource strain: Not on file  . Food insecurity:    Worry: Not on file    Inability: Not on file  . Transportation needs:    Medical: Not on file    Non-medical: Not on file  Tobacco Use  . Smoking status: Never Smoker  . Smokeless tobacco: Never Used  Substance and Sexual Activity  . Alcohol use: No  . Drug use: No  . Sexual activity: Not on file  Lifestyle  . Physical activity:    Days per week: Not on file    Minutes per  session: Not on file  . Stress: Not on file  Relationships  . Social connections:    Talks on phone: Not on file    Gets together: Not on file    Attends religious service: Not on file    Active member of club or organization: Not on file    Attends meetings of clubs or organizations: Not on file    Relationship status: Not on file  Other Topics Concern  . Not on file  Social History Narrative   Lives at home with wife.   Married.   Caffeine use: none         Family History: The patient's family history includes Arthritis in his unknown relative; Coronary artery disease in an other family member; Heart disease in  his brother, father, and mother; Hyperlipidemia in his brother, father, and unknown relative; Hypertension in his unknown relative; Stroke in his father. There is no history of Dementia.  ROS:   Please see the history of present illness.    All other systems reviewed and are negative.  EKGs/Labs/Other Studies Reviewed:    The following studies were reviewed today: I discussed my findings with the patient at extensive length.   Recent Labs: 01/22/2018: ALT 13; Hemoglobin 13.9; Platelets 189; TSH 3.210 05/17/2018: BUN 17; Creatinine, Ser 1.02; NT-Pro BNP 756; Potassium 4.4; Sodium 143  Recent Lipid Panel    Component Value Date/Time   CHOL 118 01/22/2018 0905   TRIG 98 01/22/2018 0905   TRIG 107 05/09/2010   HDL 40 01/22/2018 0905   CHOLHDL 3.0 01/22/2018 0905   CHOLHDL 3.6 05/20/2015 1410   VLDL 27 05/20/2015 1410   LDLCALC 58 01/22/2018 0905    Physical Exam:    VS:  BP 118/76   Pulse 79   Ht 5\' 6"  (1.676 m)   Wt 217 lb 9.6 oz (98.7 kg)   SpO2 98%   BMI 35.12 kg/m     Wt Readings from Last 3 Encounters:  08/13/18 217 lb 9.6 oz (98.7 kg)  07/31/18 216 lb (98 kg)  06/13/18 216 lb (98 kg)     GEN: Patient is in no acute distress HEENT: Normal NECK: No JVD; No carotid bruits LYMPHATICS: No lymphadenopathy CARDIAC: Hear sounds regular, 2/6  systolic murmur at the apex. RESPIRATORY:  Clear to auscultation without rales, wheezing or rhonchi  ABDOMEN: Soft, non-tender, non-distended MUSCULOSKELETAL:  No edema; No deformity  SKIN: Warm and dry NEUROLOGIC:  Alert and oriented x 3 PSYCHIATRIC:  Normal affect   Signed, Jenean Lindau, MD  08/13/2018 10:39 AM    [place]

## 2018-08-13 NOTE — Patient Instructions (Signed)

## 2018-08-17 ENCOUNTER — Telehealth: Payer: Self-pay | Admitting: Cardiology

## 2018-08-17 NOTE — Telephone Encounter (Signed)
Ok

## 2018-08-17 NOTE — Telephone Encounter (Signed)
Pt wants to switch from [facility] to [facility]

## 2018-08-17 NOTE — Telephone Encounter (Signed)
ok 

## 2018-09-16 DIAGNOSIS — I255 Ischemic cardiomyopathy: Secondary | ICD-10-CM | POA: Insufficient documentation

## 2018-09-16 DIAGNOSIS — I351 Nonrheumatic aortic (valve) insufficiency: Secondary | ICD-10-CM | POA: Insufficient documentation

## 2018-09-16 HISTORY — DX: Ischemic cardiomyopathy: I25.5

## 2018-09-16 HISTORY — DX: Nonrheumatic aortic (valve) insufficiency: I35.1

## 2018-09-16 NOTE — Progress Notes (Signed)
Cardiology Office Note:    Date:  09/18/2018   ID:  Shawn Meza, DOB [birth year], MRN [id number]  PCP:  Serita Grammes, MD  Cardiologist:  Shirlee More, MD    Referring MD: Cyndy Freeze, MD    ASSESSMENT:    1. Chronic systolic (congestive) heart failure ([place])   2. Coronary artery disease involving native coronary artery of native heart with angina pectoris ([place])   3. Ischemic cardiomyopathy   4. Nonrheumatic aortic valve insufficiency    PLAN:    In order of problems listed above:  1. He has a history of CAD previous bypass surgery and severe left ventricular dysfunction now has overt heart failure.  When to go ahead and start him on Entresto and a loop diuretic check home blood pressures daily bring back in 1 week to check renal function and proBNP.  We will try to uptitrate being very careful about inducing hypotension with his neurologic disease and make a decision about MRA depending on potassium and renal function.  After optimal medical therapy reassess ejection fraction and consider the merits of ICD.  He already sodium restricts he will weigh daily and monitor home BP.  I asked him to wait a week regarding a decision to return to cardiac rehabilitation. 2. Stable CAD recent ischemia evaluation shows no inducible ischemia at this time would not consider angiography 3. Severe by both echo 35-40 and gated pool 25-30 initiate guideline directed therapy 4. Clinically stable and mild 5. Stable hyperlipidemia continue a statin   Next appointment: 1 week at high risk of hypotension with his neurologic disease   Medication Adjustments/Labs and Tests Ordered: Current medicines are reviewed at length with the patient today.  Concerns regarding medicines are outlined above.  No orders of the defined types were placed in this encounter.  Meds ordered this encounter  Medications  . furosemide (LASIX) 40 MG tablet    Sig: Take 1 tablet (40 mg total) by mouth daily.   Dispense:  30 tablet    Refill:  1  . DISCONTD: sacubitril-valsartan (ENTRESTO) 24-26 MG    Sig: Take 1 tablet by mouth 2 (two) times daily.    Dispense:  60 tablet    Refill:  1    Please Honor Card patient is presenting for Carmie Kanner: [id number]; Juanna Cao: [phone]; QMGQQ: [id number]; [id number] ID: [id number]  . sacubitril-valsartan (ENTRESTO) 24-26 MG    Sig: Take 1 tablet by mouth 2 (two) times daily.    Dispense:  60 tablet    Refill:  1    Please Honor Card patient is presenting for Carmie Kanner: [id number]; Juanna Cao: [phone]; KWIOX: [id number]; ISSUER: [id number] ID: [id number]    Chief Complaint  Patient presents with  . Follow-up  . Coronary Artery Disease  . Cardiomyopathy    History of Present Illness:    Shawn Meza is a 73 y.o. male with a hx of coronary artery disease with coronary artery bypass surgery last seen by my partner Dr. Geraldo Pitter 08/13/2018.  Patient requested to see me in the office with a notation that he had questions.  Echocardiogram 06/04/2018 showed hypokinesia of the anterior anterior septal regions distribution left anterior descending coronary artery ejection fraction 35 to 40% and mild aortic regurgitation.  A Myoview study was performed 07/31/2018 with ejection fraction of 23% with global hypokinesia and extensive fixed defect and no ischemia.  proBNP level 05/17/2018 was significantly elevated at 756 with a creatinine of 1.02 and GFR 72 cc/min.  I had seen  him in the office once 05/16/2018 when he is breathless and he had followed up with my partner afterwards.  He also has idiopathic Parkinson's disease and follows with Dr. Carles Collet.  Compliance with diet, lifestyle and medications: Yes Past Medical History:  Diagnosis Date  . Arthritis   . BPH (benign prostatic hypertrophy)   . CAD (coronary artery disease)   . Colon cancer ([place])   . Elevated PSA   . Erectile dysfunction   . Essential tremor   . GERD (gastroesophageal reflux disease)   . Headache(784.0)   .  Hiatal hernia   . Hyperlipidemia   . Hypertension   . Major depression, chronic   . Medial meniscus tear 10/11/2011  . Metabolic syndrome   . Nephrolithiasis    hx of  . Transient ischemic attack    hx of  . Trochanteric bursitis of right hip     Past Surgical History:  Procedure Laterality Date  . CARDIAC CATHETERIZATION  5/12,1/13   4 stents placed  . COLON SURGERY    . KNEE ARTHROSCOPY  10/11/2011   Procedure: ARTHROSCOPY KNEE;  Surgeon: Lorn Junes, MD;  Location: [facility];  Service: Orthopedics;  Laterality: Left;  Left Knee Arthroscopy with Medial and Lateral Partial Menisectomy, Chondroplasty  . LEFT HEART CATHETERIZATION WITH CORONARY ANGIOGRAM N/A 08/25/2011   Procedure: LEFT HEART CATHETERIZATION WITH CORONARY ANGIOGRAM;  Surgeon: Burnell Blanks, MD;  Location: [facility] CATH LAB;  Service: Cardiovascular;  Laterality: N/A;  . STERIOD INJECTION  10/11/2011   Procedure: STEROID INJECTION;  Surgeon: Lorn Junes, MD;  Location: [facility];  Service: Orthopedics;  Laterality: Right;  Steroid Injection Second Toe  . TRANSURETHRAL RESECTION OF PROSTATE      Current Medications: Current Meds  Medication Sig  . aspirin 81 MG tablet Take 81 mg by mouth daily.   Marland Kitchen atorvastatin (LIPITOR) 20 MG tablet Take 1 tablet (20 mg total) by mouth daily.  . clopidogrel (PLAVIX) 75 MG tablet TAKE 1 TABLET (75 MG TOTAL) BY MOUTH ONCE DAILY.  Marland Kitchen escitalopram (LEXAPRO) 5 MG tablet Take 5 mg by mouth daily.   . fluocinonide-emollient (LIDEX-E) 0.05 % cream Apply 1 application topically 2 (two) times daily.  . metoprolol tartrate (LOPRESSOR) 25 MG tablet TAKE 1/2 TABLET BY MOUTH TWICE DAILY  . Multiple Vitamin (MULTI-VITAMINS) TABS Take 1 tablet by mouth daily.   . nitroGLYCERIN (NITROSTAT) 0.4 MG SL tablet Place 1 tablet (0.4 mg total) under the tongue every 5 (five) minutes as needed. Chest pain  . pantoprazole (PROTONIX) 40 MG tablet Take 40 mg by mouth  daily.  . tamsulosin (FLOMAX) 0.4 MG CAPS capsule Take 0.4 mg by mouth daily as needed (to relax the prostate).   . TRINTELLIX 5 MG TABS tablet Take 5 mg by mouth daily.     Allergies:   Testosterone; Fluoxetine; Other; and Requip [ropinirole]   Social History   Socioeconomic History  . Marital status: Married    Spouse name: Tammie  . Number of children: 1  . Years of education: 59  . Highest education level: Not on file  Occupational History  . 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 Financial resource strain: Not on file  . Food insecurity:    Worry: Not on file    Inability: Not on file  . Transportation needs:    Medical: Not on file    Non-medical: Not on file  Tobacco Use  .  Smoking status: Never Smoker  . Smokeless tobacco: Never Used  Substance and Sexual Activity  . Alcohol use: No  . Drug use: No  . Sexual activity: Not on file  Lifestyle  . Physical activity:    Days per week: Not on file    Minutes per session: Not on file  . Stress: Not on file  Relationships  . Social connections:    Talks on phone: Not on file    Gets together: Not on file    Attends religious service: Not on file    Active member of club or organization: Not on file    Attends meetings of clubs or organizations: Not on file    Relationship status: Not on file  Other Topics Concern  . Not on file  Social History Narrative   Lives at home with wife.   Married.   Caffeine use: none         Family History: The patient's family history includes Arthritis in an other family member; Coronary artery disease in an other family member; Heart disease in his brother, father, and mother; Hyperlipidemia in his brother, father, and another family member; Hypertension in an other family member; Stroke in his father. There is no history of Dementia. ROS:   Please see the history of present illness.    All other systems reviewed and are negative.  EKGs/Labs/Other  Studies Reviewed:    The following studies were reviewed today:  Recent Labs: 01/22/2018: ALT 13; Hemoglobin 13.9; Platelets 189; TSH 3.210 05/17/2018: BUN 17; Creatinine, Ser 1.02; NT-Pro BNP 756; Potassium 4.4; Sodium 143  Recent Lipid Panel    Component Value Date/Time   CHOL 118 01/22/2018 0905   TRIG 98 01/22/2018 0905   TRIG 107 05/09/2010   HDL 40 01/22/2018 0905   CHOLHDL 3.0 01/22/2018 0905   CHOLHDL 3.6 05/20/2015 1410   VLDL 27 05/20/2015 1410   LDLCALC 58 01/22/2018 0905    Physical Exam:    VS:  BP (!) 142/68 (BP Location: Right Arm, Patient Position: Sitting, Cuff Size: Large)   Pulse 85   Ht 5\' 6"  (1.676 m)   Wt 220 lb 6.4 oz (100 kg)   SpO2 97%   BMI 35.57 kg/m     Wt Readings from Last 3 Encounters:  09/18/18 220 lb 6.4 oz (100 kg)  08/13/18 217 lb 9.6 oz (98.7 kg)  07/31/18 216 lb (98 kg)     GEN:  Well nourished, well developed in no acute distress HEENT: Normal NECK: No JVD; No carotid bruits LYMPHATICS: No lymphadenopathy CARDIAC: RRR, no murmurs, rubs, gallops  Soft s1 RESPIRATORY:  Clear to auscultation without rales, wheezing or rhonchi  ABDOMEN: Soft, non-tender, non-distended MUSCULOSKELETAL:  2+ bilateral to the knee edema; No deformity  SKIN: Warm and dry NEUROLOGIC:  Alert and oriented x 3 PSYCHIATRIC:  Normal affect    Signed, Shirlee More, MD  09/18/2018 10:12 AM    [place]

## 2018-09-18 ENCOUNTER — Encounter: Payer: Self-pay | Admitting: Cardiology

## 2018-09-18 ENCOUNTER — Ambulatory Visit (INDEPENDENT_AMBULATORY_CARE_PROVIDER_SITE_OTHER): Payer: Medicare Other | Admitting: Cardiology

## 2018-09-18 VITALS — BP 142/68 | HR 85 | Ht 66.0 in | Wt 220.4 lb

## 2018-09-18 DIAGNOSIS — I25119 Atherosclerotic heart disease of native coronary artery with unspecified angina pectoris: Secondary | ICD-10-CM | POA: Diagnosis not present

## 2018-09-18 DIAGNOSIS — I351 Nonrheumatic aortic (valve) insufficiency: Secondary | ICD-10-CM

## 2018-09-18 DIAGNOSIS — I5022 Chronic systolic (congestive) heart failure: Secondary | ICD-10-CM

## 2018-09-18 DIAGNOSIS — I255 Ischemic cardiomyopathy: Secondary | ICD-10-CM | POA: Diagnosis not present

## 2018-09-18 HISTORY — DX: Chronic systolic (congestive) heart failure: I50.22

## 2018-09-18 MED ORDER — SACUBITRIL-VALSARTAN 24-26 MG PO TABS: 1.0000 | ORAL_TABLET | Freq: Two times a day (BID) | ORAL | 1 refills | Status: DC

## 2018-09-18 MED ORDER — FUROSEMIDE 40 MG PO TABS: 40.0000 mg | ORAL_TABLET | Freq: Every day | ORAL | 1 refills | Status: DC

## 2018-09-18 NOTE — Patient Instructions (Signed)
Medication Instructions:  Your physician has recommended you make the following change in your medication:  START furosemide (lasix) 40 mg: Take 1 daily  START sacubitril-valsartan (entresto) 24-26 mg: Take 1 tablet twice daily  **Buy a blood pressure cuff and check your BP daily at the same time every day. Record these readings and bring this log with you to your follow up appointment for Dr. [name] to review.   If you need a refill on your cardiac medications before your next appointment, please call your pharmacy.   Lab work: None  If you have labs (blood work) drawn today and your tests are completely normal, you will receive your results only by: Marland Kitchen MyChart Message (if you have MyChart) OR . A paper copy in the mail If you have any lab test that is abnormal or we need to change your treatment, we will call you to review the results.  Testing/Procedures: None  Follow-Up: At [facility], you and your health needs are our priority.  As part of our continuing mission to provide you with exceptional heart care, we have created designated Provider Care Teams.  These Care Teams include your primary Cardiologist (physician) and Advanced Practice Providers (APPs -  Physician Assistants and Nurse Practitioners) who all work together to provide you with the care you need, when you need it. You will need a follow up appointment in 1 weeks.      Furosemide tablets What is this medicine? FUROSEMIDE (fyoor [place] se mide) is a diuretic. It helps you make more urine and to lose salt and excess water from your body. This medicine is used to treat high blood pressure, and edema or swelling from heart, kidney, or liver disease. This medicine may be used for other purposes; ask your health care provider or pharmacist if you have questions. COMMON BRAND NAME(S): Active-Medicated Specimen Kit, Delone, Diuscreen, Lasix, RX Specimen Collection Kit, Specimen Collection Kit, URINX Medicated Specimen  Collection What should I tell my health care provider before I take this medicine? They need to know if you have any of these conditions: -abnormal blood electrolytes -diarrhea or vomiting -gout -heart disease -kidney disease, small amounts of urine, or difficulty passing urine -liver disease -thyroid disease -an unusual or allergic reaction to furosemide, sulfa drugs, other medicines, foods, dyes, or preservatives -pregnant or trying to get pregnant -breast-feeding How should I use this medicine? Take this medicine by mouth with a glass of water. Follow the directions on the prescription label. You may take this medicine with or without food. If it upsets your stomach, take it with food or milk. Do not take your medicine more often than directed. Remember that you will need to pass more urine after taking this medicine. Do not take your medicine at a time of day that will cause you problems. Do not take at bedtime. Talk to your pediatrician regarding the use of this medicine in children. While this drug may be prescribed for selected conditions, precautions do apply. Overdosage: If you think you have taken too much of this medicine contact a poison control center or emergency room at once. NOTE: This medicine is only for you. Do not share this medicine with others. What if I miss a dose? If you miss a dose, take it as soon as you can. If it is almost time for your next dose, take only that dose. Do not take double or extra doses. What may interact with this medicine? -aspirin and aspirin-like medicines -certain antibiotics -chloral hydrate -  cisplatin -cyclosporine -digoxin -diuretics -laxatives -lithium -medicines for blood pressure -medicines that relax muscles for surgery -methotrexate -NSAIDs, medicines for pain and inflammation like ibuprofen, naproxen, or indomethacin -phenytoin -steroid medicines like prednisone or cortisone -sucralfate -thyroid hormones This list may not  describe all possible interactions. Give your health care provider a list of all the medicines, herbs, non-prescription drugs, or dietary supplements you use. Also tell them if you smoke, drink alcohol, or use illegal drugs. Some items may interact with your medicine. What should I watch for while using this medicine? Visit your doctor or health care professional for regular checks on your progress. Check your blood pressure regularly. Ask your doctor or health care professional what your blood pressure should be, and when you should contact him or her. If you are a diabetic, check your blood sugar as directed. You may need to be on a special diet while taking this medicine. Check with your doctor. Also, ask how many glasses of fluid you need to drink a day. You must not get dehydrated. You may get drowsy or dizzy. Do not drive, use machinery, or do anything that needs mental alertness until you know how this drug affects you. Do not stand or sit up quickly, especially if you are an older patient. This reduces the risk of dizzy or fainting spells. Alcohol can make you more drowsy and dizzy. Avoid alcoholic drinks. This medicine can make you more sensitive to the sun. Keep out of the sun. If you cannot avoid being in the sun, wear protective clothing and use sunscreen. Do not use sun lamps or tanning beds/booths. What side effects may I notice from receiving this medicine? Side effects that you should report to your doctor or health care professional as soon as possible: -blood in urine or stools -dry mouth -fever or chills -hearing loss or ringing in the ears -irregular heartbeat -muscle pain or weakness, cramps -skin rash -stomach upset, pain, or nausea -tingling or numbness in the hands or feet -unusually weak or tired -vomiting or diarrhea -yellowing of the eyes or skin Side effects that usually do not require medical attention (report to your doctor or health care professional if they  continue or are bothersome): -headache -loss of appetite -unusual bleeding or bruising This list may not describe all possible side effects. Call your doctor for medical advice about side effects. You may report side effects to FDA at 1-800-FDA-1088. Where should I keep my medicine? Keep out of the reach of children. Store at room temperature between 15 and 30 degrees C (59 and 86 degrees F). Protect from light. Throw away any unused medicine after the expiration date. NOTE: This sheet is a summary. It may not cover all possible information. If you have questions about this medicine, talk to your doctor, pharmacist, or health care provider.  2019 Elsevier/Gold Standard (2014-10-08 13:49:50)    Sacubitril; Valsartan oral tablet What is this medicine? SACUBITRIL; VALSARTAN (sak UE bi tril; val SAR tan) is a combination of 2 drugs used to reduce the risk of death and hospitalizations in people with long-lasting heart failure. It is usually used with other medicines to treat heart failure. This medicine may be used for other purposes; ask your health care provider or pharmacist if you have questions. COMMON BRAND NAME(S): Entresto What should I tell my health care provider before I take this medicine? They need to know if you have any of these conditions: -diabetes and take a medicine that contains aliskiren -kidney disease -liver disease -  an unusual or allergic reaction to sacubitril; valsartan, drugs called angiotensin converting enzyme (ACE) inhibitors, angiotensin II receptor blockers (ARBs), other medicines, foods, dyes, or preservatives -pregnant or trying to get pregnant -breast-feeding How should I use this medicine? Take this medicine by mouth with a glass of water. Follow the directions on the prescription label. You can take it with or without food. If it upsets your stomach, take it with food. Take your medicine at regular intervals. Do not take it more often than directed. Do not  stop taking except on your doctor's advice. Do not take this medicine for at least 36 hours before or after you take an ACE inhibitor medicine. Talk to your health care provider if you are not sure if you take an ACE inhibitor. Talk to your pediatrician regarding the use of this medicine in children. Special care may be needed. Overdosage: If you think you have taken too much of this medicine contact a poison control center or emergency room at once. NOTE: This medicine is only for you. Do not share this medicine with others. What if I miss a dose? If you miss a dose, take it as soon as you can. If it is almost time for next dose, take only that dose. Do not take double or extra doses. What may interact with this medicine? Do not take this medicine with any of the following medicines: -aliskiren if you have diabetes -angiotensin-converting enzyme (ACE) inhibitors, like benazepril, captopril, enalapril, fosinopril, lisinopril, or ramipril This medicine may also interact with the following medicines: -angiotensin II receptor blockers (ARBs) like azilsartan, candesartan, eprosartan, irbesartan, losartan, olmesartan, telmisartan, or valsartan -lithium -NSAIDS, medicines for pain and inflammation, like ibuprofen or naproxen -potassium-sparing diuretics like amiloride, spironolactone, and triamterene -potassium supplements This list may not describe all possible interactions. Give your health care provider a list of all the medicines, herbs, non-prescription drugs, or dietary supplements you use. Also tell them if you smoke, drink alcohol, or use illegal drugs. Some items may interact with your medicine. What should I watch for while using this medicine? Tell your doctor or healthcare professional if your symptoms do not start to get better or if they get worse. Do not become pregnant while taking this medicine. Women should inform their doctor if they wish to become pregnant or think they might be  pregnant. There is a potential for serious side effects to an unborn child. Talk to your health care professional or pharmacist for more information. You may get dizzy. Do not drive, use machinery, or do anything that needs mental alertness until you know how this medicine affects you. Do not stand or sit up quickly, especially if you are an older patient. This reduces the risk of dizzy or fainting spells. Avoid alcoholic drinks; they can make you more dizzy. What side effects may I notice from receiving this medicine? Side effects that you should report to your doctor or health care professional as soon as possible: -allergic reactions like skin rash, itching or hives, swelling of the face, lips, or tongue -signs and symptoms of increased potassium like muscle weakness; chest pain; or fast, irregular heartbeat -signs and symptoms of kidney injury like trouble passing urine or change in the amount of urine -signs and symptoms of low blood pressure like feeling dizzy or lightheaded, or if you develop extreme fatigue Side effects that usually do not require medical attention (report to your doctor or health care professional if they continue or are bothersome): -cough This list may  not describe all possible side effects. Call your doctor for medical advice about side effects. You may report side effects to FDA at 1-800-FDA-1088. Where should I keep my medicine? Keep out of the reach of children. Store at room temperature between 15 and 30 degrees C (59 and 86 degrees F). Throw away any unused medicine after the expiration date. NOTE: This sheet is a summary. It may not cover all possible information. If you have questions about this medicine, talk to your doctor, pharmacist, or health care provider.  2019 Elsevier/Gold Standard (2015-09-02 13:54:19)

## 2018-09-24 NOTE — Progress Notes (Signed)
Cardiology Office Note:    Date:  09/26/2018   ID:  Shawn Meza, DOB 1946-01-09, MRN [id number]  PCP:  Serita Grammes, MD  Cardiologist:  Shirlee More, MD    Referring MD: Serita Grammes, MD    ASSESSMENT:    1. Chronic systolic (congestive) heart failure ([place])   2. Coronary artery disease involving native coronary artery of native heart with angina pectoris ([place])   3. Parkinson's disease ([place])    PLAN:    In order of problems listed above:  1. Improved uptitrate Entresto recheck renal function proBNP and address MRA increase beta-blocker and optimal dose of Entresto next visit.  He will continue his current loop diuretic 2. Stable continue medical therapy aspirin beta-blocker and statin 3. Stable has had no hypotension with his vasodilators   Next appointment: 1 month   Medication Adjustments/Labs and Tests Ordered: Current medicines are reviewed at length with the patient today.  Concerns regarding medicines are outlined above.  Orders Placed This Encounter  Procedures  . Pro b natriuretic peptide (BNP)  . Basic Metabolic Panel (BMET)   Meds ordered this encounter  Medications  . sacubitril-valsartan (ENTRESTO) 49-51 MG    Sig: Take 1 tablet by mouth 2 (two) times daily.    Dispense:  60 tablet    Refill:  1    Chief Complaint  Patient presents with  . Follow-up    for heart failure after starting Entresto  . Nasal Congestion    History of Present Illness:    Shawn Meza is a 73 y.o. male with a hx of coronary artery disease with coronary artery bypass surgery last seen 09/18/18.  Patient requested to see me in the office with a notation that he had questions.  Echocardiogram 06/04/2018 showed hypokinesia of the anterior anterior septal regions distribution left anterior descending coronary artery ejection fraction 35 to 40% and mild aortic regurgitation.  A Myoview study was performed 07/31/2018 with ejection fraction of 23% with global hypokinesia  and extensive fixed defect and no ischemia.  proBNP level 05/17/2018  was significantly elevated at 756 with a creatinine of 1.02 and GFR 72 cc/min.   I had seen him in the office once 05/16/2018 when he is breathless and he had followed up with my partner afterwards.  He also has idiopathic Parkinson's disease and follows with Dr. Carles Collet.  Compliance with diet, lifestyle and medications: Yes  Has had nasal congestion but only using over-the-counter NyQuil his heart rate is rapid I asked him to stop.  Since he started [facility] he feels markedly improved no edema shortness of breath.  His blood pressures at home with been stable we will uptitrate today discontinue NyQuil and next visit consider MRA increasing beta-blocker and optimal dose of Entresto after rechecking renal function and proBNP level no orthopnea chest pain or syncope Past Medical History:  Diagnosis Date  . Arthritis   . BPH (benign prostatic hypertrophy)   . CAD (coronary artery disease)   . Colon cancer ([place])   . Elevated PSA   . Erectile dysfunction   . Essential tremor   . GERD (gastroesophageal reflux disease)   . Headache(784.0)   . Hiatal hernia   . Hyperlipidemia   . Hypertension   . Major depression, chronic   . Medial meniscus tear 10/11/2011  . Metabolic syndrome   . Nephrolithiasis    hx of  . Transient ischemic attack    hx of  . Trochanteric bursitis of right hip  Past Surgical History:  Procedure Laterality Date  . CARDIAC CATHETERIZATION  5/12,1/13   4 stents placed  . COLON SURGERY    . KNEE ARTHROSCOPY  10/11/2011   Procedure: ARTHROSCOPY KNEE;  Surgeon: Lorn Junes, MD;  Location: [facility];  Service: Orthopedics;  Laterality: Left;  Left Knee Arthroscopy with Medial and Lateral Partial Menisectomy, Chondroplasty  . LEFT HEART CATHETERIZATION WITH CORONARY ANGIOGRAM N/A 08/25/2011   Procedure: LEFT HEART CATHETERIZATION WITH CORONARY ANGIOGRAM;  Surgeon: Burnell Blanks,  MD;  [facility] LAB;  Service: Cardiovascular;  Laterality: N/A;  . STERIOD INJECTION  10/11/2011   Procedure: STEROID INJECTION;  Surgeon: Lorn Junes, MD;  Location: [place];  Service: Orthopedics;  Laterality: Right;  Steroid Injection Second Toe  . TRANSURETHRAL RESECTION OF PROSTATE      Current Medications: Current Meds  Medication Sig  . aspirin 81 MG tablet Take 81 mg by mouth daily.   Marland Kitchen atorvastatin (LIPITOR) 20 MG tablet Take 1 tablet (20 mg total) by mouth daily.  . clopidogrel (PLAVIX) 75 MG tablet TAKE 1 TABLET (75 MG TOTAL) BY MOUTH ONCE DAILY.  Marland Kitchen escitalopram (LEXAPRO) 5 MG tablet Take 5 mg by mouth daily.   . fluocinonide-emollient (LIDEX-E) 0.05 % cream Apply 1 application topically 2 (two) times daily as needed.  . furosemide (LASIX) 40 MG tablet Take 1 tablet (40 mg total) by mouth daily.  . metoprolol tartrate (LOPRESSOR) 25 MG tablet TAKE 1/2 TABLET BY MOUTH TWICE DAILY  . Multiple Vitamin (MULTI-VITAMINS) TABS Take 1 tablet by mouth daily.   . nitroGLYCERIN (NITROSTAT) 0.4 MG SL tablet Place 1 tablet (0.4 mg total) under the tongue every 5 (five) minutes as needed. Chest pain  . pantoprazole (PROTONIX) 40 MG tablet Take 40 mg by mouth daily.  . tamsulosin (FLOMAX) 0.4 MG CAPS capsule Take 0.4 mg by mouth daily as needed (to relax the prostate).   . TRINTELLIX 5 MG TABS tablet Take 5 mg by mouth daily.  . [DISCONTINUED] sacubitril-valsartan (ENTRESTO) 24-26 MG Take 1 tablet by mouth 2 (two) times daily.     Allergies:   Testosterone; Fluoxetine; Other; and Requip [ropinirole]   Social History   Socioeconomic History  . Marital status: Married    Spouse name: Tammie  . Number of children: 1  . Years of education: 11  . Highest education level: Not on file  Occupational History  . 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 Financial resource strain: Not on file  . Food insecurity:    Worry: Not on file     Inability: Not on file  . Transportation needs:    Medical: Not on file    Non-medical: Not on file  Tobacco Use  . Smoking status: Never Smoker  . Smokeless tobacco: Never Used  Substance and Sexual Activity  . Alcohol use: No  . Drug use: No  . Sexual activity: Not on file  Lifestyle  . Physical activity:    Days per week: Not on file    Minutes per session: Not on file  . Stress: Not on file  Relationships  . Social connections:    Talks on phone: Not on file    Gets together: Not on file    Attends religious service: Not on file    Active member of club or organization: Not on file    Attends meetings of clubs or organizations: Not on file  Relationship status: Not on file  Other Topics Concern  . Not on file  Social History Narrative   Lives at home with wife.   Married.   Caffeine use: none         Family History: The patient's family history includes Arthritis in an other family member; Coronary artery disease in an other family member; Heart disease in his brother, father, and mother; Hyperlipidemia in his brother, father, and another family member; Hypertension in an other family member; Stroke in his father. There is no history of Dementia. ROS:   Please see the history of present illness.    All other systems reviewed and are negative.  EKGs/Labs/Other Studies Reviewed:    The following studies were reviewed today:   Recent Labs: 01/22/2018: ALT 13; Hemoglobin 13.9; Platelets 189; TSH 3.210 05/17/2018: BUN 17; Creatinine, Ser 1.02; NT-Pro BNP 756; Potassium 4.4; Sodium 143  Recent Lipid Panel    Component Value Date/Time   CHOL 118 01/22/2018 0905   TRIG 98 01/22/2018 0905   TRIG 107 05/09/2010   HDL 40 01/22/2018 0905   CHOLHDL 3.0 01/22/2018 0905   CHOLHDL 3.6 05/20/2015 1410   VLDL 27 05/20/2015 1410   LDLCALC 58 01/22/2018 0905    Physical Exam:    VS:  BP (!) 144/80 (BP Location: Right Arm, Patient Position: Sitting, Cuff Size: Normal)    Pulse (!) 107   Ht 5\' 6"  (1.676 m)   Wt 214 lb 0.8 oz (97.1 kg)   SpO2 96%   BMI 34.55 kg/m     Wt Readings from Last 3 Encounters:  09/26/18 214 lb 0.8 oz (97.1 kg)  09/18/18 220 lb 6.4 oz (100 kg)  08/13/18 217 lb 9.6 oz (98.7 kg)     GEN:  Well nourished, well developed in no acute distress HEENT: Normal NECK: No JVD; No carotid bruits LYMPHATICS: No lymphadenopathy CARDIAC: RRR, no murmurs, rubs, gallops RESPIRATORY:  Clear to auscultation without rales, wheezing or rhonchi  ABDOMEN: Soft, non-tender, non-distended MUSCULOSKELETAL:  No edema; No deformity  SKIN: Warm and dry NEUROLOGIC:  Alert and oriented x 3 PSYCHIATRIC:  Normal affect    Signed, Shirlee More, MD  09/26/2018 4:36 PM    [facility]

## 2018-09-26 ENCOUNTER — Ambulatory Visit (INDEPENDENT_AMBULATORY_CARE_PROVIDER_SITE_OTHER): Payer: Medicare Other | Admitting: Cardiology

## 2018-09-26 ENCOUNTER — Encounter: Payer: Self-pay | Admitting: Cardiology

## 2018-09-26 VITALS — BP 144/80 | HR 107 | Ht 66.0 in | Wt 214.1 lb

## 2018-09-26 DIAGNOSIS — I25119 Atherosclerotic heart disease of native coronary artery with unspecified angina pectoris: Secondary | ICD-10-CM

## 2018-09-26 DIAGNOSIS — I5022 Chronic systolic (congestive) heart failure: Secondary | ICD-10-CM | POA: Diagnosis not present

## 2018-09-26 DIAGNOSIS — G20 Parkinson's disease: Secondary | ICD-10-CM | POA: Diagnosis not present

## 2018-09-26 MED ORDER — SACUBITRIL-VALSARTAN 49-51 MG PO TABS: 1.0000 | ORAL_TABLET | Freq: Two times a day (BID) | ORAL | 1 refills | Status: DC

## 2018-09-26 NOTE — Patient Instructions (Addendum)
Medication Instructions:   Your physician has recommended you make the following change in your medication:   INCREASE: Entresto dose to 49-51 mg. Take 1 tablet by mouth twice a day.   If you need a refill on your cardiac medications before your next appointment, please call your pharmacy.   Lab work:  Your physician recommends that you return for lab work today: BMP, BNP   Your physician recommends that you continue on your current medications as directed. Please refer to the Current Medication list given to you today.  If you have labs (blood work) drawn today and your tests are completely normal, you will receive your results only by: Marland Kitchen MyChart Message (if you have MyChart) OR . A paper copy in the mail If you have any lab test that is abnormal or we need to change your treatment, we will call you to review the results.  Testing/Procedures:  NONE  Follow-Up: At [facility], you and your health needs are our priority.  As part of our continuing mission to provide you with exceptional heart care, we have created designated Provider Care Teams.  These Care Teams include your primary Cardiologist (physician) and Advanced Practice Providers (APPs -  Physician Assistants and Nurse Practitioners) who all work together to provide you with the care you need, when you need it.  . You will need a follow up appointment 4 weeks.  Additional instructions   1. Avoid all over-the-counter antihistamines except Claritin/Loratadine and Zyrtec/Cetrizine. 2. Avoid all combination including cold sinus allergies flu decongestant and sleep medications 3. You can use Robitussin DM Mucinex and Mucinex DM for cough. 4. can use Tylenol aspirin ibuprofen and naproxen but no combinations such as sleep or sinus.

## 2018-09-27 LAB — BASIC METABOLIC PANEL
BUN/Creatinine Ratio: 22 (ref 10–24)
BUN: 28 mg/dL — ABNORMAL HIGH (ref 8–27)
CO2: 24 mmol/L (ref 20–29)
Calcium: 9.4 mg/dL (ref 8.6–10.2)
Chloride: 99 mmol/L (ref 96–106)
Creatinine, Ser: 1.25 mg/dL (ref 0.76–1.27)
GFR calc Af Amer: 65 mL/min/{1.73_m2} (ref >59)
GFR calc non Af Amer: 56 mL/min/{1.73_m2} — ABNORMAL LOW (ref >59)
Glucose: 139 mg/dL — ABNORMAL HIGH (ref 65–99)
Potassium: 4 mmol/L (ref 3.5–5.2)
Sodium: 142 mmol/L (ref 134–144)

## 2018-09-27 LAB — PRO B NATRIURETIC PEPTIDE: NT-Pro BNP: 490 pg/mL — ABNORMAL HIGH (ref 0–376)

## 2018-10-08 ENCOUNTER — Ambulatory Visit
Admission: EM | Admit: 2018-10-08 | Discharge: 2018-10-08 | Disposition: A | Discharge status: Home / Self Care | Payer: Medicare Other | Attending: Family Medicine | Admitting: Family Medicine

## 2018-10-08 ENCOUNTER — Encounter: Payer: Self-pay | Admitting: Emergency Medicine

## 2018-10-08 DIAGNOSIS — Z951 Presence of aortocoronary bypass graft: Secondary | ICD-10-CM | POA: Diagnosis not present

## 2018-10-08 DIAGNOSIS — J22 Unspecified acute lower respiratory infection: Secondary | ICD-10-CM | POA: Diagnosis not present

## 2018-10-08 MED ORDER — BENZONATATE 200 MG PO CAPS: 200.0000 mg | ORAL_CAPSULE | Freq: Two times a day (BID) | ORAL | 0 refills | Status: DC | PRN

## 2018-10-08 MED ORDER — AMOXICILLIN-POT CLAVULANATE 875-125 MG PO TABS: 1.0000 | ORAL_TABLET | Freq: Two times a day (BID) | ORAL | 0 refills | Status: DC

## 2018-10-08 MED ORDER — FLUOCINONIDE-E 0.05 % EX CREA: 1.0000 "application " | TOPICAL_CREAM | Freq: Two times a day (BID) | CUTANEOUS | 2 refills | Status: DC

## 2018-10-08 NOTE — Discharge Instructions (Signed)
Drink plenty of fluids Take antibiotic 2 times a day Use cough medicine as needed See your doctor if not improved in a few days

## 2018-10-08 NOTE — ED Triage Notes (Signed)
PT presents to [facility] for 2 weeks of nasal congestion, cough, chest congestion, and eye drainage.

## 2018-10-08 NOTE — ED Notes (Signed)
Patient able to ambulate independently At discharge patient wanting to speak to MD again

## 2018-10-08 NOTE — ED Provider Notes (Signed)
[facility]    CSN: [id number] Arrival date & time: 10/08/18  1340     History   Chief Complaint Chief Complaint  Patient presents with  . URI    HPI Shawn Meza is a 73 y.o. male.   HPI  Patient states that he had open heart surgery, quadruple bypass last March.  Ever since then he has had a slight cough off and on.  His heart doctor tells him that it is from his heart disease.  Now for the last 2 weeks he has had cough, chest congestion, eye drainage, nasal congestion, low-grade fever, fatigue, increased sputum.  He is a non-smoker, no COPD or asthma.  He has taken some over-the-counter cough medication.  He is trying to push fluids. He is compliant with his medicines and medical care. His vital signs are stable  Past Medical History:  Diagnosis Date  . Arthritis   . BPH (benign prostatic hypertrophy)   . CAD (coronary artery disease)   . Colon cancer ([place])   . Elevated PSA   . Erectile dysfunction   . Essential tremor   . GERD (gastroesophageal reflux disease)   . Headache(784.0)   . Hiatal hernia   . Hyperlipidemia   . Hypertension   . Major depression, chronic   . Medial meniscus tear 10/11/2011  . Metabolic syndrome   . Nephrolithiasis    hx of  . Transient ischemic attack    hx of  . Trochanteric bursitis of right hip     Patient Active Problem List   Diagnosis Date Noted  . Chronic systolic (congestive) heart failure ([place]) 09/18/2018  . Ischemic cardiomyopathy 09/16/2018  . Aortic regurgitation 09/16/2018  . Cardiomyopathy, unspecified ([place]) 06/13/2018  . Abscess of right axilla 12/12/2017  . BMI 33.0-33.9,adult 12/12/2017  . 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  . Acute blood loss anemia 10/25/2017  . Acute postoperative respiratory insufficiency 10/25/2017  . Postoperative delirium 10/25/2017  . Dyslipidemia 10/17/2017  . SOB (shortness of breath) 03/31/2017  . Long term current use of aspirin 03/29/2017  .  Parkinson's disease ([place]) 01/02/2017  . Morbid (severe) obesity due to excess calories ([place]) 10/27/2015  . Memory change 07/06/2015  . Depression 07/06/2015  . Medial meniscus tear 10/11/2011  . Neuroma of foot 10/11/2011  . Coronary artery disease involving native coronary artery of native heart with angina pectoris ([place]) 12/28/2010  . 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 Mixed hyperlipidemia 05/20/2010  . Hypertensive heart disease 05/20/2010  . 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 GERD 05/20/2010  . TRANSIENT ISCHEMIC ATTACK, HX OF 05/20/2010  . NEPHROLITHIASIS, HX OF 05/20/2010  . BENIGN PROSTATIC HYPERTROPHY, HX OF, S/P TURP 05/20/2010    Past Surgical History:  Procedure Laterality Date  . CARDIAC CATHETERIZATION  5/12,1/13   4 stents placed  . COLON SURGERY    . KNEE ARTHROSCOPY  10/11/2011   Procedure: ARTHROSCOPY KNEE;  Surgeon: Lorn Junes, MD;  Location: [place];  Service: Orthopedics;  Laterality: Left;  Left Knee Arthroscopy with Medial and Lateral Partial Menisectomy, Chondroplasty  . LEFT HEART CATHETERIZATION WITH CORONARY ANGIOGRAM N/A 08/25/2011   Procedure: LEFT HEART CATHETERIZATION WITH CORONARY ANGIOGRAM;  Surgeon: Burnell Blanks, MD;  Location: [facility] CATH LAB;  Service: Cardiovascular;  Laterality: N/A;  . STERIOD INJECTION  10/11/2011   Procedure: STEROID INJECTION;  Surgeon: Lorn Junes, MD;  Location: [place];  Service: Orthopedics;  Laterality: Right;  Steroid Injection Second Toe  . TRANSURETHRAL RESECTION OF PROSTATE         Home Medications    Prior to Admission medications   Medication Sig Start Date End Date Taking? Authorizing Provider  amoxicillin-clavulanate (AUGMENTIN) 875-125 MG tablet Take 1 tablet by mouth every 12 (twelve) hours. 10/08/18   Raylene Everts, MD  aspirin 81 MG tablet Take 81 mg by mouth daily.     [provider]  atorvastatin (LIPITOR) 20 MG  tablet Take 1 tablet (20 mg total) by mouth daily. 06/14/16   Copland, Gay Filler, MD  benzonatate (TESSALON) 200 MG capsule Take 1 capsule (200 mg total) by mouth 2 (two) times daily as needed for cough. 10/08/18   Raylene Everts, MD  clopidogrel (PLAVIX) 75 MG tablet TAKE 1 TABLET (75 MG TOTAL) BY MOUTH ONCE DAILY. 05/20/15   Copland, Gay Filler, MD  escitalopram (LEXAPRO) 5 MG tablet Take 5 mg by mouth daily.  09/13/17   [provider]  fluocinonide-emollient (LIDEX-E) 0.05 % cream Apply 1 application topically 2 (two) times daily as needed.    [provider]  fluocinonide-emollient (LIDEX-E) 0.05 % cream Apply 1 application topically 2 (two) times daily. 10/08/18   Raylene Everts, MD  furosemide (LASIX) 40 MG tablet Take 1 tablet (40 mg total) by mouth daily. 09/18/18 12/17/18  Richardo Priest, MD  metoprolol tartrate (LOPRESSOR) 25 MG tablet TAKE 1/2 TABLET BY MOUTH TWICE DAILY 07/31/15   Copland, Gay Filler, MD  Multiple Vitamin (MULTI-VITAMINS) TABS Take 1 tablet by mouth daily.  11/06/17   [provider]  nitroGLYCERIN (NITROSTAT) 0.4 MG SL tablet Place 1 tablet (0.4 mg total) under the tongue every 5 (five) minutes as needed. Chest pain 12/21/17   Revankar, Reita Cliche, MD  pantoprazole (PROTONIX) 40 MG tablet Take 40 mg by mouth daily. 09/13/17   [provider]  sacubitril-valsartan (ENTRESTO) 49-51 MG Take 1 tablet by mouth 2 (two) times daily. 09/26/18   Richardo Priest, MD  tamsulosin (FLOMAX) 0.4 MG CAPS capsule Take 0.4 mg by mouth daily as needed (to relax the prostate).  11/16/15   [provider]  TRINTELLIX 5 MG TABS tablet Take 5 mg by mouth daily. 08/30/18   [provider]    Family History Family History  Problem Relation Age of Onset  . Heart disease Mother   . Heart disease Father   . Hyperlipidemia Father   . Stroke Father   . Hyperlipidemia Brother   . Heart disease Brother   . Coronary artery disease Other         family hx of male 1st degree relative ,107  . Hyperlipidemia Other        family hx of  . Hypertension Other        family hx of  . Arthritis Other        family hx of  . Dementia Neg Hx     Social History Social History   Tobacco Use  . Smoking status: Never Smoker  . Smokeless tobacco: Never Used  Substance Use Topics  . Alcohol use: No  . Drug use: No     Allergies   Testosterone; Fluoxetine; and Requip [ropinirole]   Review of Systems Review of Systems  Constitutional: Positive for fatigue. Negative for chills and fever.  HENT: Positive for congestion, postnasal drip and rhinorrhea. Negative for ear pain and sore throat.   Eyes: Negative for pain and visual disturbance.  Respiratory:  Positive for cough. Negative for shortness of breath.   Cardiovascular: Negative for chest pain and palpitations.  Gastrointestinal: Negative for abdominal pain and vomiting.  Genitourinary: Negative for dysuria and hematuria.  Musculoskeletal: Negative for arthralgias and back pain.  Skin: Negative for color change and rash.  Neurological: Negative for seizures and syncope.  Psychiatric/Behavioral: Positive for sleep disturbance.  All other systems reviewed and are negative.    Physical Exam Triage Vital Signs ED Triage Vitals  Enc Vitals Group     BP 10/08/18 1348 120/80     Pulse Rate 10/08/18 1348 89     Resp 10/08/18 1348 18     Temp 10/08/18 1348 (!) 97.2 F (36.2 C)     Temp Source 10/08/18 1348 Oral     SpO2 10/08/18 1348 95 %     Weight --      Height --      Head Circumference --      Peak Flow --      Pain Score 10/08/18 1349 0     Pain Loc --      Pain Edu? --      Excl. in [place]? --    No data found.  Updated Vital Signs BP 120/80 (BP Location: Left Arm)   Pulse 89   Temp (!) 97.2 F (36.2 C) (Oral)   Resp 18   SpO2 95%   Visual Acuity Right Eye Distance:   Left Eye Distance:   Bilateral Distance:    Right Eye Near:   Left Eye Near:    Bilateral  Near:     Physical Exam Constitutional:      General: He is not in acute distress.    Appearance: He is well-developed. He is obese. He is ill-appearing.  HENT:     Head: Normocephalic and atraumatic.     Right Ear: Tympanic membrane, ear canal and external ear normal.     Left Ear: Tympanic membrane, ear canal and external ear normal.     Nose: Nose normal. No congestion.     Mouth/Throat:     Mouth: Mucous membranes are moist.     Pharynx: Posterior oropharyngeal erythema present.  Eyes:     Conjunctiva/sclera: Conjunctivae normal.     Pupils: Pupils are equal, round, and reactive to light.  Neck:     Musculoskeletal: Normal range of motion.  Cardiovascular:     Rate and Rhythm: Normal rate and regular rhythm.     Heart sounds: Normal heart sounds.  Pulmonary:     Effort: Pulmonary effort is normal. No respiratory distress.     Breath sounds: Rhonchi present.     Comments: Few anterior rhonchi Abdominal:     General: There is no distension.     Palpations: Abdomen is soft.  Musculoskeletal: Normal range of motion.  Skin:    General: Skin is warm and dry.  Neurological:     Mental Status: He is alert.  Psychiatric:        Mood and Affect: Mood normal.        Behavior: Behavior normal.      UC Treatments / Results  Labs (all labs ordered are listed, but only abnormal results are displayed) Labs Reviewed - No data to display  EKG None  Radiology No results found.  Procedures Procedures (including critical care time)  Medications Ordered in UC Medications - No data to display  Initial Impression / Assessment and Plan / UC Course  I have reviewed the  triage vital signs and the nursing notes.  Pertinent labs & imaging results that were available during my care of the patient were reviewed by me and considered in my medical decision making (see chart for details).     Final Clinical Impressions(s) / UC Diagnoses   Final diagnoses:  LRTI (lower  respiratory tract infection)     Discharge Instructions     Drink plenty of fluids Take antibiotic 2 times a day Use cough medicine as needed See your doctor if not improved in a few days   ED Prescriptions    Medication Sig Dispense Auth. Provider   amoxicillin-clavulanate (AUGMENTIN) 875-125 MG tablet Take 1 tablet by mouth every 12 (twelve) hours. 14 tablet Raylene Everts, MD   benzonatate (TESSALON) 200 MG capsule Take 1 capsule (200 mg total) by mouth 2 (two) times daily as needed for cough. 20 capsule Raylene Everts, MD     Controlled Substance Prescriptions [place] Controlled Substance Registry consulted? Not Applicable   Raylene Everts, MD 10/08/18 1505

## 2018-11-01 ENCOUNTER — Telehealth: Payer: Self-pay | Admitting: *Deleted

## 2018-11-01 NOTE — Telephone Encounter (Signed)
YOUR CARDIOLOGY TEAM HAS ARRANGED FOR AN E-VISIT FOR YOUR APPOINTMENT - PLEASE REVIEW IMPORTANT INFORMATION BELOW SEVERAL DAYS PRIOR TO YOUR APPOINTMENT  Due to the recent COVID-19 pandemic, we are transitioning in-person office visits to tele-medicine visits in an effort to decrease unnecessary exposure to our patients and staff. Medicare and most insurances are covering these visits without a copay needed. We also encourage you to sign up for MyChart if you have not already done so. You will need a smartphone if possible. For patients that do not have this, we can still complete the visit using a regular telephone but do prefer a smartphone to enable video when possible. You may have a close family member that lives with you that can help. If possible, we also ask that you have a blood pressure cuff and scale at home to measure your blood pressure, heart rate and weight prior to your scheduled appointment. Patients with clinical needs that need an in-person evaluation and testing will still be able to come to the office if absolutely necessary. If you have any questions, feel free to call our office.    IF YOU HAVE A SMARTPHONE, PLEASE DOWNLOAD THE WEBEX APP TO YOUR SMARTPHONE  - If Apple, go to [employer] and type in WebEx in the search bar. [place] [employer], the blue/green circle. The app is free but as with any other app download, your phone may require you to verify saved payment information or Apple password. You do NOT have to create a WebEx account.  - If Android, go to [employer] and type in [employer] in the search bar. [place] [employer], the blue/green circle. The app is free but as with any other app download, your phone may require you to verify saved payment information or Android password. You do NOT have to create a WebEx account.  It is very helpful to have this downloaded before your visit.    2-3 DAYS BEFORE YOUR APPOINTMENT  You will receive a  telephone call from one of our [place] team members - your caller ID may say "Unknown caller." If this is a video visit, we will confirm that you have been able to download the WebEx app. We will remind you check your blood pressure, heart rate and weight prior to your scheduled appointment. If you have an Apple Watch or Kardia, please upload any pertinent ECG strips the day before or morning of your appointment to [place]. Our staff will also make sure you have reviewed the consent and agree to move forward with your scheduled tele-health visit.     THE DAY OF YOUR APPOINTMENT  Approximately 15 minutes prior to your scheduled appointment, you will receive a telephone call from one of [place] team - your caller ID may say "Unknown caller."  Our staff will confirm medications, vital signs for the day and any symptoms you may be experiencing. Please have this information available prior to the time of visit start. It may also be helpful for you to have a pad of paper and pen handy for any instructions given during your visit. They will also walk you through joining the WebEx smartphone meeting if this is a video visit.    CONSENT FOR TELE-HEALTH VISIT - PLEASE REVIEW  I hereby voluntarily request, consent and authorize [facility] and its employed or contracted physicians, physician assistants, nurse practitioners or other licensed health care professionals (the Practitioner), to provide me with telemedicine health care services (the Services") as  deemed necessary by the treating Practitioner. I acknowledge and consent to receive the Services by the Practitioner via telemedicine. I understand that the telemedicine visit will involve communicating with the Practitioner through live audiovisual communication technology and the disclosure of certain medical information by electronic transmission. I acknowledge that I have been given the opportunity to request an in-person assessment or other available  alternative prior to the telemedicine visit and am voluntarily participating in the telemedicine visit.  I understand that I have the right to withhold or withdraw my consent to the use of telemedicine in the course of my care at any time, without affecting my right to future care or treatment, and that the Practitioner or I may terminate the telemedicine visit at any time. I understand that I have the right to inspect all information obtained and/or recorded in the course of the telemedicine visit and may receive copies of available information for a reasonable fee.  I understand that some of the potential risks of receiving the Services via telemedicine include:   Delay or interruption in medical evaluation due to technological equipment failure or disruption;  Information transmitted may not be sufficient (e.g. poor resolution of images) to allow for appropriate medical decision making by the Practitioner; and/or   In rare instances, security protocols could fail, causing a breach of personal health information.  Furthermore, I acknowledge that it is my responsibility to provide information about my medical history, conditions and care that is complete and accurate to the best of my ability. I acknowledge that Practitioner's advice, recommendations, and/or decision may be based on factors not within their control, such as incomplete or inaccurate data provided by me or distortions of diagnostic images or specimens that may result from electronic transmissions. I understand that the practice of medicine is not an exact science and that Practitioner makes no warranties or guarantees regarding treatment outcomes. I acknowledge that I will receive a copy of this consent concurrently upon execution via email to the email address I last provided but may also request a printed copy by calling the office of [place].    I understand that my insurance will be billed for this visit.   I have read or had  this consent read to me.  I understand the contents of this consent, which adequately explains the benefits and risks of the Services being provided via telemedicine.   I have been provided ample opportunity to ask questions regarding this consent and the Services and have had my questions answered to my satisfaction.  I give my informed consent for the services to be provided through the use of telemedicine in my medical care  By participating in this telemedicine visit I agree to the above.  Patient has verbally consented to televisit on Monday, 11/05/2018, at 3:20 pm with Dr. [name].       Cardiac Questionnaire:    Since your last visit or hospitalization:    1. Have you been having new or worsening chest pain? No   2. Have you been having new or worsening shortness of breath? No 3. Have you been having new or worsening leg swelling, wt gain, or increase in abdominal girth (pants fitting more tightly)? No   4. Have you had any passing out spells? No   _____________   [id number] Pre-Screening Questions:   Do you currently have a fever? No  Have you recently travelled on a cruise, internationally, or to [place], [place], [place], [place], [place], or [place], [place] [employer])? No  Have you been in contact with someone that is currently pending confirmation of Covid19 testing or has been confirmed to have the [place] virus? No  Are you currently experiencing fatigue or cough? Patient has noticed fatigue since December 2019.

## 2018-11-05 ENCOUNTER — Encounter: Payer: Self-pay | Admitting: Cardiology

## 2018-11-05 ENCOUNTER — Telehealth (INDEPENDENT_AMBULATORY_CARE_PROVIDER_SITE_OTHER): Payer: Medicare Other | Admitting: Cardiology

## 2018-11-05 VITALS — BP 110/68 | HR 78 | Wt 210.0 lb

## 2018-11-05 DIAGNOSIS — E782 Mixed hyperlipidemia: Secondary | ICD-10-CM

## 2018-11-05 DIAGNOSIS — I5022 Chronic systolic (congestive) heart failure: Secondary | ICD-10-CM

## 2018-11-05 DIAGNOSIS — I110 Hypertensive heart disease with heart failure: Secondary | ICD-10-CM

## 2018-11-05 DIAGNOSIS — I25119 Atherosclerotic heart disease of native coronary artery with unspecified angina pectoris: Secondary | ICD-10-CM

## 2018-11-05 NOTE — Patient Instructions (Addendum)
Medication Instructions:  Your physician recommends that you continue on your current medications as directed. Please refer to the Current Medication list given to you today.  If you need a refill on your cardiac medications before your next appointment, please call your pharmacy.   Lab work: None  If you have labs (blood work) drawn today and your tests are completely normal, you will receive your results only by: Marland Kitchen MyChart Message (if you have MyChart) OR . A paper copy in the mail If you have any lab test that is abnormal or we need to change your treatment, we will call you to review the results.  Testing/Procedures: None  Follow-Up: At [facility], you and your health needs are our priority.  As part of our continuing mission to provide you with exceptional heart care, we have created designated Provider Care Teams.  These Care Teams include your primary Cardiologist (physician) and Advanced Practice Providers (APPs -  Physician Assistants and Nurse Practitioners) who all work together to provide you with the care you need, when you need it. You will need a follow up appointment in 2 months: Tuesday, 01/15/2019, at 3:00 pm in [place] office.

## 2018-11-05 NOTE — Progress Notes (Signed)
Virtual Visit via Telephone Note   This visit type was conducted due to national recommendations for restrictions regarding the COVID-19 Pandemic (e.g. social distancing) in an effort to limit this patient's exposure and mitigate transmission in our community.  Due to his co-morbid illnesses, this patient is at least at moderate risk for complications without adequate follow up.  This format is felt to be most appropriate for this patient at this time.  The patient did not have access to video technology/had technical difficulties with video requiring transitioning to audio format only (telephone).  All issues noted in this document were discussed and addressed.  No physical exam could be performed with this format.  Please refer to the patient's chart for his  consent to telehealth for [facility].   Evaluation Performed:  Follow-up visit  Date:  11/05/2018   ID:  Shawn Meza, DOB February 12, 1946, MRN [id number]  Patient Location: Home  Provider Location: Home  PCP:  Serita Grammes, MD  Cardiologist:  No primary care provider on file. Dr [name] Electrophysiologist:  None   Chief Complaint:  Heart failure  History of Present Illness:    Shawn Meza is a 73 y.o. male who presents via audio/video conferencing for a telehealth visit today.    Shawn Meza is a 73 y.o. male with a hx of coronary artery disease with coronary artery bypass surgery last seen 09/26/18 with up titration of entresto.  Patient requested to see me in the office with a notation that he had questions. Echocardiogram 06/04/2018 showed hypokinesia of the anterior anterior septal regions distribution left anterior descending coronary artery ejection fraction 35 to 40% and mild aortic regurgitation.  A Myoview study was performed 07/31/2018 with ejection fraction of 23% with global hypokinesia and extensive fixed defect and no ischemia.  proBNP level 05/17/2018  was significantly elevated at 756 with a creatinine of  1.02 and GFR 72 cc/min.  He also has idiopathic Parkinson's disease and follows with Dr. Carles Collet  He was seen in the urgent care  10/08/2018 with cough nonproductive and fever and was discharged to home care.  He did not have a chest x-ray or labs performed.  He was afebrile oxygen saturation was 95%  The patient does not have symptoms concerning for COVID-19 infection (fever, chills, cough, or new shortness of breath).   He is improved with his nasal congestion and does not have fever cough or shortness of breath.  His predominant complaint is feeling fatigued and he is taking a antidepressant because of anxiety and depression.  He has had no edema orthopnea chest pain palpitation or syncope and tolerates his dual antiplatelet without bleeding.  Home blood pressures are generally greater than 885 systolic but are consistently greater than 110.  With his Parkinson's disease have been hesitant to induce systolic blood pressures of less than 110 to avoid hypotension.  Past Medical History:  Diagnosis Date   Arthritis    BPH (benign prostatic hypertrophy)    CAD (coronary artery disease)    Colon cancer (HCC)    Elevated PSA    Erectile dysfunction    Essential tremor    GERD (gastroesophageal reflux disease)    Headache(784.0)    Hiatal hernia    Hyperlipidemia    Hypertension    Major depression, chronic    Medial meniscus tear 0/27/7412   Metabolic syndrome    Nephrolithiasis    hx of   Transient ischemic attack    hx of  Trochanteric bursitis of right hip    Past Surgical History:  Procedure Laterality Date   CARDIAC CATHETERIZATION  5/12,1/13   4 stents placed   COLON SURGERY     KNEE ARTHROSCOPY  10/11/2011   Procedure: ARTHROSCOPY KNEE;  Surgeon: Lorn Junes, MD;  Location: [place];  Service: Orthopedics;  Laterality: Left;  Left Knee Arthroscopy with Medial and Lateral Partial Menisectomy, Chondroplasty   LEFT HEART CATHETERIZATION  WITH CORONARY ANGIOGRAM N/A 08/25/2011   Procedure: LEFT HEART CATHETERIZATION WITH CORONARY ANGIOGRAM;  Surgeon: Burnell Blanks, MD;  Location[facility];  Service: Cardiovascular;  Laterality: N/A;   STERIOD INJECTION  10/11/2011   Procedure: STEROID INJECTION;  Surgeon: Lorn Junes, MD;  Location: [place];  Service: Orthopedics;  Laterality: Right;  Steroid Injection Second Toe   TRANSURETHRAL RESECTION OF PROSTATE       Current Meds  Medication Sig   aspirin 81 MG tablet Take 81 mg by mouth daily.    atorvastatin (LIPITOR) 20 MG tablet Take 1 tablet (20 mg total) by mouth daily.   clopidogrel (PLAVIX) 75 MG tablet TAKE 1 TABLET (75 MG TOTAL) BY MOUTH ONCE DAILY.   fluocinonide-emollient (LIDEX-E) 0.05 % cream Apply 1 application topically 2 (two) times daily as needed.   fluocinonide-emollient (LIDEX-E) 0.05 % cream Apply 1 application topically 2 (two) times daily.   furosemide (LASIX) 40 MG tablet Take 1 tablet (40 mg total) by mouth daily.   metoprolol tartrate (LOPRESSOR) 25 MG tablet TAKE 1/2 TABLET BY MOUTH TWICE DAILY   Multiple Vitamin (MULTI-VITAMINS) TABS Take 1 tablet by mouth daily.    nitroGLYCERIN (NITROSTAT) 0.4 MG SL tablet Place 1 tablet (0.4 mg total) under the tongue every 5 (five) minutes as needed. Chest pain   pantoprazole (PROTONIX) 40 MG tablet Take 40 mg by mouth daily.   sacubitril-valsartan (ENTRESTO) 49-51 MG Take 1 tablet by mouth 2 (two) times daily.   tamsulosin (FLOMAX) 0.4 MG CAPS capsule Take 0.4 mg by mouth daily as needed (to relax the prostate).    TRINTELLIX 5 MG TABS tablet Take 5 mg by mouth daily.     Allergies:   Testosterone; Fluoxetine; and Requip [ropinirole]   Social History   Tobacco Use   Smoking status: Never Smoker   Smokeless tobacco: Never Used  Substance Use Topics   Alcohol use: No   Drug use: No     Family Hx: The patient's family history includes Arthritis in an other  family member; Coronary artery disease in an other family member; Heart disease in his brother, father, and mother; Hyperlipidemia in his brother, father, and another family member; Hypertension in an other family member; Stroke in his father. There is no history of Dementia.  ROS:   Please see the history of present illness.     All other systems reviewed and are negative.   Prior CV studies:   The following studies were reviewed today:  07/31/18 MUGA EF 27%  Labs/Other Tests and Data Reviewed:     Recent Labs: 01/22/2018: ALT 13; Hemoglobin 13.9; Platelets 189; TSH 3.210 09/26/2018: BUN 28; Creatinine, Ser 1.25; NT-Pro BNP 490; Potassium 4.0; Sodium 142   Recent Lipid Panel Lab Results  Component Value Date/Time   CHOL 118 01/22/2018 09:05 AM   TRIG 98 01/22/2018 09:05 AM   TRIG 107 05/09/2010   HDL 40 01/22/2018 09:05 AM   CHOLHDL 3.0 01/22/2018 09:05 AM   CHOLHDL 3.6 05/20/2015 02:10 PM  [place] 58 01/22/2018 09:05 AM    Wt Readings from Last 3 Encounters:  11/05/18 210 lb (95.3 kg)  09/26/18 214 lb 0.8 oz (97.1 kg)  09/18/18 220 lb 6.4 oz (100 kg)     Objective:    Vital Signs:  BP 110/68 (BP Location: Left Arm, Patient Position: Sitting)    Pulse 78    Wt 210 lb (95.3 kg)    BMI 33.89 kg/m    Well nourished, well developed male in no acute distress. Flat affect and speech  ASSESSMENT & PLAN:    1. Heart failure, stable, chronic systolic New York Heart Association class I.  With his Parkinson's disease and concerns of orthostatic hypotension I will keep him on his current tolerated dose of beta-blocker Entresto and with hesitancy to bring him into a medical facility for lab work I will hold off on MRA and plan to see him in the office in June and recheck ejection fraction around that time which was severely reduced 27% December 2019.  He will continue his current diuretic sodium restriction daily weights and blood pressure checks. 2. CAD stable continue current  treatment including dual antiplatelet and statin 3. Hypertensive heart disease with heart failure stable continue current guideline directed therapy loop diuretic beta-blocker and Entresto 4. Hyperlipidemia continue with statin.  COVID-19 Education: The signs and symptoms of COVID-19 were discussed with the patient and how to seek care for testing (follow up with PCP or arrange E-visit).  The importance of social distancing was discussed today.  Time:   Today, I have spent 22 minutes with the patient with telehealth technology discussing the above problems,.  He had questions regarding his cardiomyopathy reduced ejection fraction and medical treatment.  I answered them successfully and he voiced understanding.  He also questions the necessity of dual antiplatelet therapy he has been on a long-term is had no bleeding complication and I think that his benefit risk ratio is good and I asked him to continue the same.  He will need a CMP lipid profile and proBNP level next visit.     Medication Adjustments/Labs and Tests Ordered: Current medicines are reviewed at length with the patient today.  Concerns regarding medicines are outlined above.  Tests Ordered: No orders of the defined types were placed in this encounter.  Medication Changes: No orders of the defined types were placed in this encounter.   Disposition:  Follow up June 2020  SignedShirlee More, MD  11/05/2018 3:42 PM    [place]

## 2018-11-07 ENCOUNTER — Ambulatory Visit: Payer: Self-pay | Admitting: Family Medicine

## 2018-11-19 DIAGNOSIS — M4306 Spondylolysis, lumbar region: Secondary | ICD-10-CM | POA: Diagnosis not present

## 2018-11-19 DIAGNOSIS — M5432 Sciatica, left side: Secondary | ICD-10-CM | POA: Diagnosis not present

## 2018-11-19 DIAGNOSIS — M9905 Segmental and somatic dysfunction of pelvic region: Secondary | ICD-10-CM | POA: Diagnosis not present

## 2018-11-19 DIAGNOSIS — M9903 Segmental and somatic dysfunction of lumbar region: Secondary | ICD-10-CM | POA: Diagnosis not present

## 2018-11-23 DIAGNOSIS — M9905 Segmental and somatic dysfunction of pelvic region: Secondary | ICD-10-CM | POA: Diagnosis not present

## 2018-11-23 DIAGNOSIS — M4306 Spondylolysis, lumbar region: Secondary | ICD-10-CM | POA: Diagnosis not present

## 2018-11-23 DIAGNOSIS — M9903 Segmental and somatic dysfunction of lumbar region: Secondary | ICD-10-CM | POA: Diagnosis not present

## 2018-11-23 DIAGNOSIS — M5432 Sciatica, left side: Secondary | ICD-10-CM | POA: Diagnosis not present

## 2018-11-26 DIAGNOSIS — M5432 Sciatica, left side: Secondary | ICD-10-CM | POA: Diagnosis not present

## 2018-11-26 DIAGNOSIS — M9905 Segmental and somatic dysfunction of pelvic region: Secondary | ICD-10-CM | POA: Diagnosis not present

## 2018-11-26 DIAGNOSIS — M4306 Spondylolysis, lumbar region: Secondary | ICD-10-CM | POA: Diagnosis not present

## 2018-11-26 DIAGNOSIS — M9903 Segmental and somatic dysfunction of lumbar region: Secondary | ICD-10-CM | POA: Diagnosis not present

## 2018-11-27 ENCOUNTER — Other Ambulatory Visit: Payer: Self-pay | Admitting: Cardiology

## 2018-11-27 DIAGNOSIS — I25119 Atherosclerotic heart disease of native coronary artery with unspecified angina pectoris: Secondary | ICD-10-CM

## 2018-11-27 DIAGNOSIS — I5022 Chronic systolic (congestive) heart failure: Secondary | ICD-10-CM

## 2018-11-28 DIAGNOSIS — M9903 Segmental and somatic dysfunction of lumbar region: Secondary | ICD-10-CM | POA: Diagnosis not present

## 2018-11-28 DIAGNOSIS — M4306 Spondylolysis, lumbar region: Secondary | ICD-10-CM | POA: Diagnosis not present

## 2018-11-28 DIAGNOSIS — M5432 Sciatica, left side: Secondary | ICD-10-CM | POA: Diagnosis not present

## 2018-11-28 DIAGNOSIS — M9905 Segmental and somatic dysfunction of pelvic region: Secondary | ICD-10-CM | POA: Diagnosis not present

## 2018-12-03 DIAGNOSIS — M9905 Segmental and somatic dysfunction of pelvic region: Secondary | ICD-10-CM | POA: Diagnosis not present

## 2018-12-03 DIAGNOSIS — M4306 Spondylolysis, lumbar region: Secondary | ICD-10-CM | POA: Diagnosis not present

## 2018-12-03 DIAGNOSIS — M9903 Segmental and somatic dysfunction of lumbar region: Secondary | ICD-10-CM | POA: Diagnosis not present

## 2018-12-03 DIAGNOSIS — M5432 Sciatica, left side: Secondary | ICD-10-CM | POA: Diagnosis not present

## 2018-12-05 DIAGNOSIS — M4306 Spondylolysis, lumbar region: Secondary | ICD-10-CM | POA: Diagnosis not present

## 2018-12-05 DIAGNOSIS — M9903 Segmental and somatic dysfunction of lumbar region: Secondary | ICD-10-CM | POA: Diagnosis not present

## 2018-12-05 DIAGNOSIS — M9905 Segmental and somatic dysfunction of pelvic region: Secondary | ICD-10-CM | POA: Diagnosis not present

## 2018-12-05 DIAGNOSIS — M5432 Sciatica, left side: Secondary | ICD-10-CM | POA: Diagnosis not present

## 2018-12-06 ENCOUNTER — Ambulatory Visit: Payer: Self-pay | Admitting: Neurology

## 2018-12-06 ENCOUNTER — Telehealth: Payer: Self-pay | Admitting: *Deleted

## 2018-12-06 DIAGNOSIS — I2510 Atherosclerotic heart disease of native coronary artery without angina pectoris: Secondary | ICD-10-CM

## 2018-12-06 DIAGNOSIS — E785 Hyperlipidemia, unspecified: Secondary | ICD-10-CM

## 2018-12-06 DIAGNOSIS — I2584 Coronary atherosclerosis due to calcified coronary lesion: Principal | ICD-10-CM

## 2018-12-06 MED ORDER — ATORVASTATIN CALCIUM 20 MG PO TABS: 20.0000 mg | ORAL_TABLET | Freq: Every day | ORAL | 0 refills | Status: DC

## 2018-12-06 MED ORDER — CLOPIDOGREL BISULFATE 75 MG PO TABS: ORAL_TABLET | ORAL | 0 refills | Status: DC

## 2018-12-06 NOTE — Telephone Encounter (Signed)
Rx refill sent to pharmacy.   *STAT* If patient is at the pharmacy, call can be transferred to refill team.   1. Which medications need to be refilled? (please list name of each medication and dose if known) Clopidogrel 75 mg, and Atorvastatin 20 mg   2. Which pharmacy/location (including street and city if local pharmacy) is medication to be sent to?[facility]  3. Do they need a 30 day or 90 day supply? [place]

## 2018-12-12 ENCOUNTER — Encounter: Payer: Self-pay | Admitting: Family Medicine

## 2018-12-13 ENCOUNTER — Ambulatory Visit: Payer: Self-pay | Admitting: Family Medicine

## 2018-12-17 ENCOUNTER — Other Ambulatory Visit: Payer: Self-pay

## 2018-12-17 ENCOUNTER — Ambulatory Visit (INDEPENDENT_AMBULATORY_CARE_PROVIDER_SITE_OTHER): Payer: Medicare Other | Admitting: Family Medicine

## 2018-12-17 DIAGNOSIS — R7309 Other abnormal glucose: Secondary | ICD-10-CM

## 2018-12-17 DIAGNOSIS — I10 Essential (primary) hypertension: Secondary | ICD-10-CM | POA: Diagnosis not present

## 2018-12-17 DIAGNOSIS — M1990 Unspecified osteoarthritis, unspecified site: Secondary | ICD-10-CM

## 2018-12-17 DIAGNOSIS — R5383 Other fatigue: Secondary | ICD-10-CM

## 2018-12-17 DIAGNOSIS — I509 Heart failure, unspecified: Secondary | ICD-10-CM | POA: Diagnosis not present

## 2018-12-17 DIAGNOSIS — F330 Major depressive disorder, recurrent, mild: Secondary | ICD-10-CM

## 2018-12-17 MED ORDER — TRINTELLIX 5 MG PO TABS: 5.0000 mg | ORAL_TABLET | Freq: Every day | ORAL | 6 refills | Status: DC

## 2018-12-17 MED ORDER — GABAPENTIN 100 MG PO CAPS: 100.0000 mg | ORAL_CAPSULE | Freq: Every day | ORAL | 3 refills | Status: DC

## 2018-12-17 NOTE — Progress Notes (Signed)
Virtual Visit via Telephone Note  I connected with Shawn Meza on 12/17/18 at  1:30 PM EDT by telephone and verified that I am speaking with the correct person using two identifiers.  Location: Patient: Located at home during today's encounter  Provider: Located at primary care office      I discussed the limitations, risks, security and privacy concerns of performing an evaluation and management service by telephone and the availability of in person appointments. I also discussed with the patient that there may be a patient responsible charge related to this service. The patient expressed understanding and agreed to proceed.   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; GERD; TRANSIENT ISCHEMIC ATTACK, HX OF; NEPHROLITHIASIS, HX OF; BENIGN PROSTATIC HYPERTROPHY, HX OF, S/P TURP; Coronary artery disease involving native coronary artery of native heart with angina pectoris ([place]); Medial meniscus tear; Neuroma of foot; Memory change; Depression; Parkinson's disease ([place]); SOB (shortness of breath); Long term current use of aspirin; Morbid (severe) obesity due to excess calories ([place]); Acute blood loss anemia; Acute postoperative respiratory insufficiency; Dyslipidemia; Postoperative delirium; S/P CABG (coronary artery bypass graft); Abscess of right axilla; BMI 33.0-33.9,adult; Cardiomyopathy, unspecified ([place]); Ischemic cardiomyopathy; Aortic regurgitation; and Chronic systolic (congestive) heart failure (HCC) on their problem list.  History of Present Illness:  Shawn Meza wishes to establish care today. He is currently followed by cardiologist, Dr. Shirlee More for management of cardiovascular disease. Patient has complex cardiovascular history included recent MI 10/17/2017 and subsequent CABG x 4 10/23/17 ([facility]). He also suffers from Parkinson's disease and has seen neurologist, Dr.Rebecca [name], at [facility]. Shawn Meza also suffers from depression. Currently is prescribed Trintellix for management of depression symptoms. He is not currently receiving any behavioral health counseling.   Fatigue  Main complaint of today is chronic fatigue. He recently went fishing and became so tired he was unable to sit in the boat and finish this activity. He is tired by walking to mailbox. It takes him a few hours in the morning to get dressed due to he has to take frequent breaks. He is not convinced the fatigue is related to his heart. He feels something else is causing his to feel worn out.  No recent TSH level viewable per EMR. He denies any known history of thyroid disorder.  Arthritis Pain  Trygve reports several years of suffering from pain related to arthritis. At some point he was prescribed Meloxicam and occasionally takes ibuprofen now. He is prescribed both Plavix and aspirin for anti-platelet therapy and was unaware that he should not take NSAIDs with these medications. He is requesting a medication to help management joint pain.  Family History  Problem Relation Age of Onset  . Heart disease Mother   . Heart disease Father   . Hyperlipidemia Father   . Stroke Father   . Hyperlipidemia Brother   . Heart disease Brother   . Coronary artery disease Other        family hx of male 1st degree relative ,32  . Hyperlipidemia Other        family hx of  . Hypertension Other        family hx of  . Arthritis Other        family hx of  . Dementia Neg Hx     Assessment and Plan: 1. Fatigue, unspecified type, checking  - Thyroid Panel With TSH; Future - Vitamin D, 25-hydroxy; Future  2. Elevated  glucose - Hemoglobin A1c; Future  3. Essential hypertension, uncertain of control. Will completed BP check at nurse visit.  Asymptomatic of cardiovascular related symptoms at present.  - Comprehensive metabolic panel  4. Congestive heart failure, unspecified HF chronicity, unspecified heart failure  type ([place]) -Continue follow-up at [place]. -Monitor weight daily -If you experiencing greater than 3 lb weight gain within 24 hours, notify our clinic or if after hours report to the Emergency Department. -Avoid adding table salt or eating food such as can soup or frozen meals which are high in sodium. This increases fluid retention and is likely to worsen your heart failure.  5. Mild episode of recurrent major depressive disorder ([place]) -Continue Trintellix   6. Arthritis Pain  -Trial Gabapentin 100 mg at bedtime -Take tylenol 325-650 mg BID PRN for pain   Follow Up Instructions: RTC Lab visit within 5 days Follow-up with provider will be based on lab results.    I discussed the assessment and treatment plan with the patient. The patient was provided an opportunity to ask questions and all were answered. The patient agreed with the plan and demonstrated an understanding of the instructions.   The patient was advised to call back or seek an in-person evaluation if the symptoms worsen or if the condition fails to improve as anticipated.  I provided 30 minutes of non-face-to-face time during this encounter.   Molli Barrows, FNP

## 2018-12-19 ENCOUNTER — Other Ambulatory Visit: Payer: Self-pay

## 2018-12-19 ENCOUNTER — Ambulatory Visit (INDEPENDENT_AMBULATORY_CARE_PROVIDER_SITE_OTHER): Payer: Medicare Other

## 2018-12-19 VITALS — BP 105/71 | HR 69 | Temp 98.2°F | Resp 17 | Ht 66.0 in | Wt 209.2 lb

## 2018-12-19 DIAGNOSIS — R5383 Other fatigue: Secondary | ICD-10-CM | POA: Diagnosis not present

## 2018-12-19 DIAGNOSIS — I10 Essential (primary) hypertension: Secondary | ICD-10-CM

## 2018-12-19 DIAGNOSIS — R7309 Other abnormal glucose: Secondary | ICD-10-CM | POA: Diagnosis not present

## 2018-12-19 NOTE — Progress Notes (Signed)
Patient in office for labs & full vitals. Patient states that he has taken all morning medications. Denies chest pain, SHOB, lower extremity swelling. After sitting BP was 105/71, pulse 69. Spoke with provider who states to leave medications as is & to follow up in office in 2 months. KWalker, CMA.

## 2018-12-20 LAB — HEMOGLOBIN A1C
Est. average glucose Bld gHb Est-mCnc: 117 mg/dL
Hgb A1c MFr Bld: 5.7 % — ABNORMAL HIGH (ref 4.8–5.6)

## 2018-12-20 LAB — COMPREHENSIVE METABOLIC PANEL
ALT: 21 IU/L (ref 0–44)
AST: 18 IU/L (ref 0–40)
Albumin/Globulin Ratio: 1.6 (ref 1.2–2.2)
Albumin: 4.3 g/dL (ref 3.7–4.7)
Alkaline Phosphatase: 73 IU/L (ref 39–117)
BUN/Creatinine Ratio: 18 (ref 10–24)
BUN: 19 mg/dL (ref 8–27)
Bilirubin Total: 0.6 mg/dL (ref 0.0–1.2)
CO2: 23 mmol/L (ref 20–29)
Calcium: 9.2 mg/dL (ref 8.6–10.2)
Chloride: 104 mmol/L (ref 96–106)
Creatinine, Ser: 1.05 mg/dL (ref 0.76–1.27)
GFR calc Af Amer: 80 mL/min/{1.73_m2} (ref >59)
GFR calc non Af Amer: 70 mL/min/{1.73_m2} (ref >59)
Globulin, Total: 2.7 g/dL (ref 1.5–4.5)
Glucose: 100 mg/dL — ABNORMAL HIGH (ref 65–99)
Potassium: 4.5 mmol/L (ref 3.5–5.2)
Sodium: 140 mmol/L (ref 134–144)
Total Protein: 7 g/dL (ref 6.0–8.5)

## 2018-12-20 LAB — VITAMIN D 25 HYDROXY (VIT D DEFICIENCY, FRACTURES): Vit D, 25-Hydroxy: 22.3 ng/mL — ABNORMAL LOW (ref 30.0–100.0)

## 2018-12-20 LAB — THYROID PANEL WITH TSH
Free Thyroxine Index: 2 (ref 1.2–4.9)
T3 Uptake Ratio: 27 % (ref 24–39)
T4, Total: 7.3 ug/dL (ref 4.5–12.0)
TSH: 2.01 u[IU]/mL (ref 0.450–4.500)

## 2018-12-25 ENCOUNTER — Other Ambulatory Visit: Payer: Self-pay | Admitting: Family Medicine

## 2018-12-25 NOTE — Telephone Encounter (Signed)
Notify patient that his vitamin D level  Is very low. I am starting him on vitamin d replacement therapy for once weekly for 12 week with ergocalciferol. Vitamin D deficiency could be the source of his fatigue. His thyroid level, kidney function, and liver function are normal. A1C indicates prediabetes at 5.7 which doesn't require medication management.  Have him schedule a follow-up in person for 6 weeks to follow-up on fatigue

## 2018-12-26 NOTE — Telephone Encounter (Signed)
Left voice mail to call back 

## 2018-12-28 MED ORDER — ERGOCALCIFEROL 1.25 MG (50000 UT) PO CAPS: 50000.0000 [IU] | ORAL_CAPSULE | ORAL | 0 refills | Status: DC

## 2018-12-28 NOTE — Telephone Encounter (Signed)
Patient notified of lab results & recommendations. Expressed understanding. Is agreeable to starting Vitamin D replacement. Prescription sent to pharmacy on file. Made follow up appointment for 7/76/2020 @ 2:10 pm.

## 2019-01-14 NOTE — Progress Notes (Signed)
Virtual Visit via Telephone Note   This visit type was conducted due to national recommendations for restrictions regarding the COVID-19 Pandemic (e.g. social distancing) in an effort to limit this patient's exposure and mitigate transmission in our community.  Due to his co-morbid illnesses, this patient is at least at moderate risk for complications without adequate follow up.  This format is felt to be most appropriate for this patient at this time.  The patient did not have access to video technology/had technical difficulties with video requiring transitioning to audio format only (telephone).  All issues noted in this document were discussed and addressed.  No physical exam could be performed with this format.  Please refer to the patient's chart for his  consent to telehealth for [facility].   Date:  01/15/2019   ID:  Shawn Meza, DOB 06/20/1946, MRN [id number]  Patient Location: Home Provider Location: Office  PCP:  Shawn Jun, FNP  Cardiologist:  Shawn More, MD  Electrophysiologist:  None   Evaluation Performed:  Follow-Up Visit  Chief Complaint:  [name], cardiomyopathy, heart failure, hypertensive heart disease presents for 2 month follow up of heart failure and cardiac conditions.   History of Present Illness:    Shawn Meza is a 73 y.o. male with  a hx of CAD with MI 10/17/17 s/p CABG 10/23/17, cardiomyopathy, heart failure, hypertensive heart disease, HLD. He has idiopathic Parkinson's disease and follows with Dr. Carles Meza. Echocardiogram 06/04/2018 showed hypokinesia of the anterior anterior septal regions distribution left anterior descending coronary artery ejection fraction 35 to 40% and mild aortic regurgitation.  A Myoview study was performed 07/31/2018 with ejection fraction of 23% with global hypokinesia and extensive fixed defect and no ischemia. Severely elevated ProBNP 05/17/18 of 756 which improved to 490 on 09/26/18.   Last seen by me  11/05/2018 with chief complaint of fatigue. He was seen 12/17/18 by his PCP for fatigue. 5/20 had labs to show normal kidney function with GFR 70, normal liver function, Vit D 22.3, A1c 5.7, normal thyroid panel.   He reports feeling weak and tired. Reports he gets up to move and "just gives out". Activity limited by back pain and fatigue. Endorses some muscle weakness - will trial stopping statin. No DOE, SOB, chest pain, palpitations. Checks his blood pressure intermittently at home. Reports no recent systolic readings below 409. Has been started on Vitamin D supplementation by his PCP. Will plan to repeat echo. Consider up-titration of Entresto, but concerned for orthostatic hypotension in the setting of Parkinson's.   The patient does not have symptoms concerning for COVID-19 infection (fever, chills, cough, or new shortness of breath).    Past Medical History:  Diagnosis Date  . Arthritis   . BPH (benign prostatic hypertrophy)   . Chronic coronary artery disease   . Colon cancer ([place])   . Elevated PSA   . Erectile dysfunction   . Essential hypertension   . Essential tremor   . GERD (gastroesophageal reflux disease)   . Headache(784.0)   . Hiatal hernia   . Hypercholesterolemia   . Long-term use of aspirin therapy   . Major depression, chronic   . Medial meniscus tear 10/11/2011  . Metabolic syndrome   . Morbid obesity ([place])   . Nephrolithiasis    hx of  . NSTEMI (non-ST elevated myocardial infarction) ([place])   . Parkinson's disease ([place])   . S/P CABG (coronary artery bypass graft)   . Transient  ischemic attack    hx of  . Trochanteric bursitis of right hip    Past Surgical History:  Procedure Laterality Date  . CARDIAC CATHETERIZATION  5/12,1/13   4 stents placed  . COLON SURGERY    . CORONARY ARTERY BYPASS GRAFT    . KNEE ARTHROSCOPY  10/11/2011   Procedure: ARTHROSCOPY KNEE;  Surgeon: Shawn Junes, MD;  Location: [facility];  Service: Orthopedics;   Laterality: Left;  Left Knee Arthroscopy with Medial and Lateral Partial Menisectomy, Chondroplasty  . LEFT HEART CATHETERIZATION WITH CORONARY ANGIOGRAM N/A 08/25/2011   Procedure: LEFT HEART CATHETERIZATION WITH CORONARY ANGIOGRAM;  Surgeon: Shawn Blanks, MD;  Location: [facility] CATH LAB;  Service: Cardiovascular;  Laterality: N/A;  . LITHOTRIPSY    . STERIOD INJECTION  10/11/2011   Procedure: STEROID INJECTION;  Surgeon: Shawn Junes, MD;  Location: [facility];  Service: Orthopedics;  Laterality: Right;  Steroid Injection Second Toe  . TRANSURETHRAL RESECTION OF PROSTATE    . URETHRAL DILATION       No outpatient medications have been marked as taking for the 01/15/19 encounter (Appointment) with Shawn Priest, MD.     Allergies:   Testosterone, Fluoxetine, and Requip [ropinirole]   Social History   Tobacco Use  . Smoking status: Never Smoker  . Smokeless tobacco: Never Used  Substance Use Topics  . Alcohol use: No  . Drug use: No     Family Hx: The patient's family history includes Arthritis in an other family member; Coronary artery disease in an other family member; Heart disease in his brother, father, and mother; Hyperlipidemia in his brother, father, and another family member; Hypertension in an other family member; Stroke in his father. There is no history of Dementia.  ROS:   Please see the history of present illness.    Review of Systems  Constitution: Negative for chills, fever, weight gain and weight loss.  Cardiovascular: Negative for chest pain, dyspnea on exertion, irregular heartbeat, leg swelling and palpitations.  Respiratory: Negative for cough, shortness of breath and wheezing.   Musculoskeletal: Positive for back pain (baseline) and myalgias.  Neurological: Positive for weakness. Negative for dizziness and light-headedness.  Psychiatric/Behavioral: Negative for altered mental status.    All other systems reviewed and are negative.    Prior CV studies:   The following studies were reviewed today:  Echocardiogram 06/04/2018 showed hypokinesia of the anterior anterior septal regions distribution left anterior descending coronary artery ejection fraction 35 to 40% and mild aortic regurgitation.  A Myoview study was performed 07/31/2018 with ejection fraction of 23% with global hypokinesia and extensive fixed defect and no ischemia.   Labs/Other Tests and Data Reviewed:    EKG:  No ECG reviewed.  Recent Labs: 01/22/2018: Hemoglobin 13.9; Platelets 189 09/26/2018: NT-Pro BNP 490 12/19/2018: ALT 21; BUN 19; Creatinine, Ser 1.05; Potassium 4.5; Sodium 140; TSH 2.010   Recent Lipid Panel Lab Results  Component Value Date/Time   CHOL 118 01/22/2018 09:05 AM   TRIG 98 01/22/2018 09:05 AM   TRIG 107 05/09/2010   HDL 40 01/22/2018 09:05 AM   CHOLHDL 3.0 01/22/2018 09:05 AM   CHOLHDL 3.6 05/20/2015 02:10 PM   LDLCALC 58 01/22/2018 09:05 AM    Wt Readings from Last 3 Encounters:  12/19/18 209 lb 3.2 oz (94.9 kg)  11/05/18 210 lb (95.3 kg)  09/26/18 214 lb 0.8 oz (97.1 kg)     Objective:    Vital Signs:  There were  no vitals taken for this visit.   VITAL SIGNS:  reviewed  ASSESSMENT & PLAN:    1. Hypertensive heart disease with heart failure - Chief complaint of fatigue. No SOB, DOE, edema. Checks BP intermittently, well controlled today, reports no episodes of hypotension.   Recheck echocardiogram.  2. Fatigue - Ongoing since last office visit. Telephone visit with PCP in May with labs: normal thyroid panel, normal kidney and liver function, low vitamin D. Started on Vitamin D by PCP. Etiology vitamin D deficiency, heart failure, valvular disease, parkinson's, deconditioning, anemia, folate/b12 deficiency.  Repeat echocardiogram to assess HF status and valvular function.   Consider CBC, folate, B12 if fatigue does not improve.  3. CAD s/p CABG - Denies chest pain. Continue GDMT of aspirin, beta blocker. Stopping  statin secondary to muscle weakness. Continue DAPT aspirin and plavix. 4. Ischemic cardiomyopathy - See hypertensive heart disease with heart failure.  5. CHF - See hypertensive heart disease with heart failure. No edema/SOB. Continue current diuretic therapy. Difficult to determine NYHA class secondary to fatigue, back pain, weakness limiting activities.  6. Parkinson's disease - Follows with neurology Dr. Carles Meza. Possible etiology of fatigue.  7. HLD - Reports muscle weakness. Trial stopping stain.  COVID-19 Education: The signs and symptoms of COVID-19 were discussed with the patient and how to seek care for testing (follow up with PCP or arrange E-visit).  The importance of social distancing was discussed today.  Time:   Today, I have spent 30 minutes with the patient with telehealth technology discussing the above problems.     Medication Adjustments/Labs and Tests Ordered: Current medicines are reviewed at length with the patient today.  Concerns regarding medicines are outlined above.   Tests Ordered: No orders of the defined types were placed in this encounter.   Medication Changes: No orders of the defined types were placed in this encounter.   Follow Up:  In Person in 4 week(s)  Signed, Shawn More, MD  01/15/2019 3:01 PM    [place]

## 2019-01-15 ENCOUNTER — Telehealth (INDEPENDENT_AMBULATORY_CARE_PROVIDER_SITE_OTHER): Payer: Medicare Other | Admitting: Cardiology

## 2019-01-15 ENCOUNTER — Other Ambulatory Visit: Payer: Self-pay

## 2019-01-15 ENCOUNTER — Encounter: Payer: Self-pay | Admitting: Cardiology

## 2019-01-15 VITALS — BP 127/56 | Ht 66.0 in | Wt 208.0 lb

## 2019-01-15 DIAGNOSIS — I25119 Atherosclerotic heart disease of native coronary artery with unspecified angina pectoris: Secondary | ICD-10-CM | POA: Diagnosis not present

## 2019-01-15 DIAGNOSIS — I255 Ischemic cardiomyopathy: Secondary | ICD-10-CM | POA: Diagnosis not present

## 2019-01-15 DIAGNOSIS — I110 Hypertensive heart disease with heart failure: Secondary | ICD-10-CM | POA: Diagnosis not present

## 2019-01-15 DIAGNOSIS — R5383 Other fatigue: Secondary | ICD-10-CM | POA: Insufficient documentation

## 2019-01-15 DIAGNOSIS — E782 Mixed hyperlipidemia: Secondary | ICD-10-CM

## 2019-01-15 DIAGNOSIS — I5022 Chronic systolic (congestive) heart failure: Secondary | ICD-10-CM | POA: Diagnosis not present

## 2019-01-15 DIAGNOSIS — G20 Parkinson's disease: Secondary | ICD-10-CM

## 2019-01-15 HISTORY — DX: Other fatigue: R53.83

## 2019-01-15 NOTE — Patient Instructions (Signed)
Medication Instructions:  Your physician has recommended you make the following change in your medication:   STOP: Lipitor  If you need a refill on your cardiac medications before your next appointment, please call your pharmacy.   Lab work: None If you have labs (blood work) drawn today and your tests are completely normal, you will receive your results only by: [facility] MyChart Message (if you have MyChart) OR . A paper copy in the mail If you have any lab test that is abnormal or we need to change your treatment, we will call you to review the results.  Testing/Procedures: Your physician has requested that you have an echocardiogram. Echocardiography is a painless test that uses sound waves to create images of your heart. It provides your doctor with information about the size and shape of your heart and how well your heart's chambers and valves are working. This procedure takes approximately one hour. There are no restrictions for this procedure.    Follow-Up: At [facility], you and your health needs are our priority.  As part of our continuing mission to provide you with exceptional heart care, we have created designated Provider Care Teams.  These Care Teams include your primary Cardiologist (physician) and Advanced Practice Providers (APPs -  Physician Assistants and Nurse Practitioners) who all work together to provide you with the care you need, when you need it. You will need a follow up appointment in 4 weeks.  Any Other Special Instructions Will Be Listed Below (If Applicable).   Echocardiogram An echocardiogram is a procedure that uses painless sound waves (ultrasound) to produce an image of the heart. Images from an echocardiogram can provide important information about:  Signs of coronary artery disease (CAD).  Aneurysm detection. An aneurysm is a weak or damaged part of an artery wall that bulges out from the normal force of blood pumping through the body.  Heart size and  shape. Changes in the size or shape of the heart can be associated with certain conditions, including heart failure, aneurysm, and CAD.  Heart muscle function.  Heart valve function.  Signs of a past heart attack.  Fluid buildup around the heart.  Thickening of the heart muscle.  A tumor or infectious growth around the heart valves. Tell a health care provider about:  Any allergies you have.  All medicines you are taking, including vitamins, herbs, eye drops, creams, and over-the-counter medicines.  Any blood disorders you have.  Any surgeries you have had.  Any medical conditions you have.  Whether you are pregnant or may be pregnant. What are the risks? Generally, this is a safe procedure. However, problems may occur, including:  Allergic reaction to dye (contrast) that may be used during the procedure. What happens before the procedure? No specific preparation is needed. You may eat and drink normally. What happens during the procedure?   An IV tube may be inserted into one of your veins.  You may receive contrast through this tube. A contrast is an injection that improves the quality of the pictures from your heart.  A gel will be applied to your chest.  A wand-like tool (transducer) will be moved over your chest. The gel will help to transmit the sound waves from the transducer.  The sound waves will harmlessly bounce off of your heart to allow the heart images to be captured in real-time motion. The images will be recorded on a computer. The procedure may vary among health care providers and hospitals. What happens  after the procedure?  You may return to your normal, everyday life, including diet, activities, and medicines, unless your health care provider tells you not to do that. Summary  An echocardiogram is a procedure that uses painless sound waves (ultrasound) to produce an image of the heart.  Images from an echocardiogram can provide important  information about the size and shape of your heart, heart muscle function, heart valve function, and fluid buildup around your heart.  You do not need to do anything to prepare before this procedure. You may eat and drink normally.  After the echocardiogram is completed, you may return to your normal, everyday life, unless your health care provider tells you not to do that. This information is not intended to replace advice given to you by your health care provider. Make sure you discuss any questions you have with your health care provider. Document Released: 07/15/2000 Document Revised: 08/20/2016 Document Reviewed: 08/20/2016 Elsevier Interactive Patient Education  2019 [employer].

## 2019-01-17 ENCOUNTER — Encounter: Payer: Self-pay | Admitting: Physician Assistant

## 2019-01-17 ENCOUNTER — Ambulatory Visit
Admission: EM | Admit: 2019-01-17 | Discharge: 2019-01-17 | Disposition: A | Discharge status: Home / Self Care | Payer: Medicare Other | Attending: Physician Assistant | Admitting: Physician Assistant

## 2019-01-17 DIAGNOSIS — H5789 Other specified disorders of eye and adnexa: Secondary | ICD-10-CM | POA: Diagnosis not present

## 2019-01-17 DIAGNOSIS — R21 Rash and other nonspecific skin eruption: Secondary | ICD-10-CM

## 2019-01-17 MED ORDER — SYSTANE 0.4-0.3 % OP GEL: 1.0000 "application " | Freq: Every evening | OPHTHALMIC | 3 refills | Status: DC | PRN

## 2019-01-17 NOTE — Discharge Instructions (Signed)
Symptoms most likely due to irritation. You can start systane gel for dry eyes. Avoid soap to the area at this time. Monitor for any worsening of symptoms, changes in vision, sensitivity to light, eye swelling, painful eye movement, follow up with ophthalmology for further evaluation.

## 2019-01-17 NOTE — ED Provider Notes (Signed)
[facility]    CSN: [id number] Arrival date & time: 01/17/19  1600     History   Chief Complaint Chief Complaint  Patient presents with  . Eye Pain    HPI Shawn Meza is a 73 y.o. male.   73 year old male comes in for evaluation of 3-day history of left eye redness, irritation, rash around the eye.  Patient was spraying sevin dust 3 days ago, states he did wash his hands after words, but did not know if he may have got some in his eye.  He has since had some eye redness, watering, irritation.  Try to use tap water to flush his eyes without relief.  States today noticed some rash around the eye and came in for evaluation.  Denies any tenderness, burning sensation.  States has foreign body sensation, but that is normal for him from dry eyes.  Denies eyelid swelling, warmth.  Denies contact lens, glasses use.     Past Medical History:  Diagnosis Date  . Arthritis   . BPH (benign prostatic hypertrophy)   . Chronic coronary artery disease   . Colon cancer ([place])   . Elevated PSA   . Erectile dysfunction   . Essential hypertension   . Essential tremor   . GERD (gastroesophageal reflux disease)   . Headache(784.0)   . Hiatal hernia   . Hypercholesterolemia   . Long-term use of aspirin therapy   . Major depression, chronic   . Medial meniscus tear 10/11/2011  . Metabolic syndrome   . Morbid obesity ([place])   . Nephrolithiasis    hx of  . NSTEMI (non-ST elevated myocardial infarction) ([place])   . Parkinson's disease ([place])   . S/P CABG (coronary artery bypass graft)   . Transient ischemic attack    hx of  . Trochanteric bursitis of right hip     Patient Active Problem List   Diagnosis Date Noted  . Fatigue 01/15/2019  . Chronic systolic (congestive) heart failure ([place]) 09/18/2018  . Ischemic cardiomyopathy 09/16/2018  . Aortic regurgitation 09/16/2018  . Cardiomyopathy, unspecified ([place]) 06/13/2018  . Abscess of right axilla 12/12/2017  . BMI  33.0-33.9,adult 12/12/2017  . 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  . Acute blood loss anemia 10/25/2017  . Acute postoperative respiratory insufficiency 10/25/2017  . Postoperative delirium 10/25/2017  . Dyslipidemia 10/17/2017  . SOB (shortness of breath) 03/31/2017  . Long term current use of aspirin 03/29/2017  . Parkinson's disease ([place]) 01/02/2017  . Morbid (severe) obesity due to excess calories ([place]) 10/27/2015  . Memory change 07/06/2015  . Depression 07/06/2015  . Medial meniscus tear 10/11/2011  . Neuroma of foot 10/11/2011  . Coronary artery disease involving native coronary artery of native heart with angina pectoris ([place]) 12/28/2010  . 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 Mixed hyperlipidemia 05/20/2010  . Hypertensive heart disease with heart failure ([place]) 05/20/2010  . 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 GERD 05/20/2010  . TRANSIENT ISCHEMIC ATTACK, HX OF 05/20/2010  . NEPHROLITHIASIS, HX OF 05/20/2010  . BENIGN PROSTATIC HYPERTROPHY, HX OF, S/P TURP 05/20/2010    Past Surgical History:  Procedure Laterality Date  . CARDIAC CATHETERIZATION  5/12,1/13   4 stents placed  . COLON SURGERY    . CORONARY ARTERY BYPASS GRAFT    . KNEE ARTHROSCOPY  10/11/2011   Procedure: ARTHROSCOPY KNEE;  Surgeon: Lorn Junes, MD;  Location: [facility];  Service: Orthopedics;  Laterality: Left;  Left  Knee Arthroscopy with Medial and Lateral Partial Menisectomy, Chondroplasty  . LEFT HEART CATHETERIZATION WITH CORONARY ANGIOGRAM N/A 08/25/2011   Procedure: LEFT HEART CATHETERIZATION WITH CORONARY ANGIOGRAM;  Surgeon: Burnell Blanks, MD;  [facility];  Service: Cardiovascular;  Laterality: N/A;  . LITHOTRIPSY    . STERIOD INJECTION  10/11/2011   Procedure: STEROID INJECTION;  Surgeon: Lorn Junes, MD;  Location: [facility];  Service: Orthopedics;  Laterality: Right;  Steroid Injection Second Toe   . TRANSURETHRAL RESECTION OF PROSTATE    . URETHRAL DILATION         Home Medications    Prior to Admission medications   Medication Sig Start Date End Date Taking? Authorizing Provider  aspirin 81 MG tablet Take 81 mg by mouth daily.     [provider]  clopidogrel (PLAVIX) 75 MG tablet TAKE 1 TABLET (75 MG TOTAL) BY MOUTH ONCE DAILY. 12/06/18   Richardo Priest, MD  ENTRESTO 49-51 MG TAKE 1 TABLET BY MOUTH 2 TIMES DAILY. 11/27/18   Richardo Priest, MD  ergocalciferol (VITAMIN D2) 1.25 MG (50000 UT) capsule Take 1 capsule (50,000 Units total) by mouth once a week. 12/28/18   Scot Jun, FNP  esomeprazole (NEXIUM) 40 MG capsule Take 1 capsule by mouth daily. 12/06/18   [provider]  fluocinonide-emollient (LIDEX-E) 0.05 % cream Apply 1 application topically 2 (two) times daily. 10/08/18   Raylene Everts, MD  furosemide (LASIX) 40 MG tablet TAKE 1 TABLET BY MOUTH DAILY. 11/27/18   Richardo Priest, MD  gabapentin (NEURONTIN) 100 MG capsule Take 1 capsule (100 mg total) by mouth at bedtime. 12/17/18   Scot Jun, FNP  metoprolol tartrate (LOPRESSOR) 25 MG tablet TAKE 1/2 TABLET BY MOUTH TWICE DAILY 07/31/15   Copland, Gay Filler, MD  Multiple Vitamin (MULTI-VITAMINS) TABS Take 1 tablet by mouth daily.  11/06/17   [provider]  nitroGLYCERIN (NITROSTAT) 0.4 MG SL tablet Place 1 tablet (0.4 mg total) under the tongue every 5 (five) minutes as needed. Chest pain 12/21/17   Revankar, Reita Cliche, MD  Polyethyl Glycol-Propyl Glycol (SYSTANE) 0.4-0.3 % GEL ophthalmic gel Place 1 application into both eyes at bedtime as needed. 01/17/19   Tasia Catchings, Kerrigan Gombos V, PA-C  tamsulosin (FLOMAX) 0.4 MG CAPS capsule Take 0.4 mg by mouth daily as needed (to relax the prostate).  11/16/15   [provider]  TRINTELLIX 5 MG TABS tablet Take 1 tablet (5 mg total) by mouth daily. 12/17/18   Scot Jun, FNP    Family History Family History  Problem Relation Age of Onset  .  Heart disease Mother   . Heart disease Father   . Hyperlipidemia Father   . Stroke Father   . Hyperlipidemia Brother   . Heart disease Brother   . Coronary artery disease Other        family hx of male 1st degree relative ,75  . Hyperlipidemia Other        family hx of  . Hypertension Other        family hx of  . Arthritis Other        family hx of  . Dementia Neg Hx     Social History Social History   Tobacco Use  . Smoking status: Never Smoker  . Smokeless tobacco: Never Used  Substance Use Topics  . Alcohol use: No  . Drug use: No     Allergies   Testosterone, Fluoxetine,  and Requip [ropinirole]   Review of Systems Review of Systems  Reason unable to perform ROS: See HPI as above.     Physical Exam Triage Vital Signs ED Triage Vitals [01/17/19 1618]  Enc Vitals Group     BP 120/81     Pulse Rate 87     Resp 16     Temp 98.2 F (36.8 C)     Temp Source Oral     SpO2 95 %     Weight      Height      Head Circumference      Peak Flow      Pain Score      Pain Loc      Pain Edu?      Excl. in [place]?    No data found.  Updated Vital Signs BP 120/81 (BP Location: Left Arm)   Pulse 87   Temp 98.2 F (36.8 C) (Oral)   Resp 16   SpO2 95%   Visual Acuity Right Eye Distance: 20/70 Left Eye Distance: 20/100 Bilateral Distance: 20/70  Right Eye Near:   Left Eye Near:    Bilateral Near:     Physical Exam Constitutional:      General: He is not in acute distress.    Appearance: He is well-developed. He is not diaphoretic.  HENT:     Head: Normocephalic and atraumatic.  Eyes:     Extraocular Movements: Extraocular movements intact.     Conjunctiva/sclera:     Right eye: Right conjunctiva is not injected.     Left eye: Left conjunctiva is injected.     Pupils: Pupils are equal, round, and reactive to light.     Comments: See picture below.  No hordeolum, foreign body seen.  Patient with pterygium, pinguecula to both eyes.  No obvious tenderness  to palpation.  Rash of the left cheek seems to be vesicular lesions, but dry.  No obvious tenderness to palpation.  Fluorescein stain without uptake.  Neck:     Musculoskeletal: Normal range of motion and neck supple.  Skin:    General: Skin is warm and dry.  Neurological:     Mental Status: He is alert and oriented to person, place, and time.          UC Treatments / Results  Labs (all labs ordered are listed, but only abnormal results are displayed) Labs Reviewed - No data to display  EKG None  Radiology No results found.  Procedures Procedures (including critical care time)  Medications Ordered in UC Medications - No data to display  Initial Impression / Assessment and Plan / UC Course  I have reviewed the triage vital signs and the nursing notes.  Pertinent labs & imaging results that were available during my care of the patient were reviewed by me and considered in my medical decision making (see chart for details).    Discussed case with Dr. Joseph Art, who states lower suspicion for shingles at this time given vesicular lesions are dry.  Discussed possible contact dermatitis causing rash.  Will have patient continue to monitor, symptomatic treatment discussed.  Return precautions given.  Patient expresses understanding and agrees to plan.  Case discussed with Dr. Joseph Art, who agrees to plan.  Final Clinical Impressions(s) / UC Diagnoses   Final diagnoses:  Redness of left eye  Rash    ED Prescriptions    Medication Sig Dispense Auth. Provider   Polyethyl Glycol-Propyl Glycol (SYSTANE) 0.4-0.3 % GEL ophthalmic gel  Place 1 application into both eyes at bedtime as needed. 10 mL Tobin Chad, [place] 01/17/19 [phone]

## 2019-01-17 NOTE — ED Notes (Signed)
Patient complains of left eye redness and swelling X 2 days. Patient was spreading Sevin Dust and believes he could've gotten some in his eye afterwards.

## 2019-01-22 ENCOUNTER — Ambulatory Visit (HOSPITAL_BASED_OUTPATIENT_CLINIC_OR_DEPARTMENT_OTHER)
Admission: RE | Admit: 2019-01-22 | Discharge: 2019-01-22 | Disposition: A | Discharge status: Home / Self Care | Payer: Medicare Other | Source: Ambulatory Visit | Attending: Cardiology | Admitting: Cardiology

## 2019-01-22 ENCOUNTER — Other Ambulatory Visit: Payer: Self-pay

## 2019-01-22 DIAGNOSIS — I255 Ischemic cardiomyopathy: Secondary | ICD-10-CM

## 2019-01-22 MED ORDER — PERFLUTREN LIPID MICROSPHERE
1.0000 mL | INTRAVENOUS | Status: AC | PRN
  Administered 2019-01-22: 2 mL via INTRAVENOUS
  Filled 2019-01-22: qty 10

## 2019-01-22 NOTE — Progress Notes (Signed)
  Echocardiogram 2D Echocardiogram has been performed.  Shawn Meza 01/22/2019, 2:22 PM

## 2019-01-23 ENCOUNTER — Telehealth: Payer: Self-pay | Admitting: *Deleted

## 2019-01-23 NOTE — Telephone Encounter (Signed)
Telephone call to patient. Left message to return call regarding echo results 

## 2019-01-23 NOTE — Telephone Encounter (Signed)
-----   Message from Richardo Priest, MD sent at 01/23/2019  7:46 AM EDT ----- Normal or stable result  His heart muscle function remains reduced but there has been improvement with his medications and strongly encouraged him to continue his current heart medicines

## 2019-01-29 ENCOUNTER — Ambulatory Visit: Payer: Self-pay | Admitting: Neurology

## 2019-02-04 ENCOUNTER — Ambulatory Visit: Payer: Medicare Other | Admitting: Family Medicine

## 2019-02-19 NOTE — Progress Notes (Signed)
Cardiology Office Note:    Date:  02/20/2019   ID:  Shawn Meza, DOB 07/04/1946, MRN [id number]  PCP:  Scot Jun, FNP  Cardiologist:  Shirlee More, MD    Referring MD: Scot Jun, FNP    ASSESSMENT:    No diagnosis found. PLAN:    In order of problems listed above:  1. Chronic systolic heart failure stable no fluid overload reduce the dose of Entresto with symptomatic hypotension.  His ejection fraction remains severely reduced and nice discussion about the potential of ICD and I understand his reasons for declining. 2. Hypertensive heart disease worsened symptomatic hypotension reduce the dose of Entresto 3. Coronary artery disease stable continue current treatment including dual antiplatelet 4. Hyperlipidemia poorly controlled he had muscle weakness with a statin I will recheck his lipids at this time I would not advise alternate lipid-lowering therapy.   Next appointment: 3 months   Medication Adjustments/Labs and Tests Ordered: Current medicines are reviewed at length with the patient today.  Concerns regarding medicines are outlined above.  No orders of the defined types were placed in this encounter.  No orders of the defined types were placed in this encounter.   Chief Complaint  Patient presents with  . Follow-up    for   . Congestive Heart Failure    History of Present Illness:    Shawn Meza is a 73 y.o. male with a hx of CAD with MI 10/17/17 s/p CABG 10/23/17, cardiomyopathy, heart failure, hypertensive heart disease, HLD. He has idiopathic Parkinson's disease and follows with Dr. Carles Collet.  Echocardiogram 06/04/2018 showed hypokinesia of the anterior anterior septal regions distribution left anterior descending coronary artery ejection fraction 35 to 40% and mild aortic regurgitation.  A Myoview study was performed 07/31/2018 with ejection fraction of 23% with global hypokinesia and extensive fixed defect and no ischemia. Severely elevated ProBNP  05/17/18 of 756 which improved to 490 on 09/26/18.   He was last seen 01/15/2019. Compliance with diet, lifestyle and medications: Yes  He relates his Parkinson's disease is worsening and is having more trouble caring for himself.  This was in the context of discussing the possibility of an ICD he declines at this time as it would not improve the quality of his life he has had trouble with symptomatic hypotension will decrease the dose of his Entresto.  No edema shortness of breath chest pain or syncope he just is steadily losing his strength and ability to care for himself he is getting psychology counseling.  He did say that stopping the statin helped his muscle weakness. Past Medical History:  Diagnosis Date  . Arthritis   . BPH (benign prostatic hypertrophy)   . Chronic coronary artery disease   . Colon cancer ([place])   . Elevated PSA   . Erectile dysfunction   . Essential hypertension   . Essential tremor   . GERD (gastroesophageal reflux disease)   . Headache(784.0)   . Hiatal hernia   . Hypercholesterolemia   . Long-term use of aspirin therapy   . Major depression, chronic   . Medial meniscus tear 10/11/2011  . Metabolic syndrome   . Morbid obesity ([place])   . Nephrolithiasis    hx of  . NSTEMI (non-ST elevated myocardial infarction) ([place])   . Parkinson's disease ([place])   . S/P CABG (coronary artery bypass graft)   . Transient ischemic attack    hx of  . Trochanteric bursitis of right hip     Past  Surgical History:  Procedure Laterality Date  . CARDIAC CATHETERIZATION  5/12,1/13   4 stents placed  . COLON SURGERY    . CORONARY ARTERY BYPASS GRAFT    . KNEE ARTHROSCOPY  10/11/2011   Procedure: ARTHROSCOPY KNEE;  Surgeon: Lorn Junes, MD;  Location: [facility];  Service: Orthopedics;  Laterality: Left;  Left Knee Arthroscopy with Medial and Lateral Partial Menisectomy, Chondroplasty  . LEFT HEART CATHETERIZATION WITH CORONARY ANGIOGRAM N/A 08/25/2011    Procedure: LEFT HEART CATHETERIZATION WITH CORONARY ANGIOGRAM;  Surgeon: Burnell Blanks, MD;  Location: [facility] CATH LAB;  Service: Cardiovascular;  Laterality: N/A;  . LITHOTRIPSY    . STERIOD INJECTION  10/11/2011   Procedure: STEROID INJECTION;  Surgeon: Lorn Junes, MD;  Location: [facility];  Service: Orthopedics;  Laterality: Right;  Steroid Injection Second Toe  . TRANSURETHRAL RESECTION OF PROSTATE    . URETHRAL DILATION      Current Medications: Current Meds  Medication Sig  . aspirin 81 MG tablet Take 81 mg by mouth daily.   . clopidogrel (PLAVIX) 75 MG tablet TAKE 1 TABLET (75 MG TOTAL) BY MOUTH ONCE DAILY.  Marland Kitchen ENTRESTO 49-51 MG TAKE 1 TABLET BY MOUTH 2 TIMES DAILY.  . ergocalciferol (VITAMIN D2) 1.25 MG (50000 UT) capsule Take 1 capsule (50,000 Units total) by mouth once a week.  . esomeprazole (NEXIUM) 40 MG capsule Take 1 capsule by mouth daily.  . fluocinonide-emollient (LIDEX-E) 0.05 % cream Apply 1 application topically 2 (two) times daily.  . furosemide (LASIX) 40 MG tablet TAKE 1 TABLET BY MOUTH DAILY.  Marland Kitchen gabapentin (NEURONTIN) 100 MG capsule Take 1 capsule (100 mg total) by mouth at bedtime.  . metoprolol tartrate (LOPRESSOR) 25 MG tablet TAKE 1/2 TABLET BY MOUTH TWICE DAILY  . Multiple Vitamin (MULTI-VITAMINS) TABS Take 1 tablet by mouth daily.   . nitroGLYCERIN (NITROSTAT) 0.4 MG SL tablet Place 1 tablet (0.4 mg total) under the tongue every 5 (five) minutes as needed. Chest pain  . Polyethyl Glycol-Propyl Glycol (SYSTANE) 0.4-0.3 % GEL ophthalmic gel Place 1 application into both eyes at bedtime as needed.  . tamsulosin (FLOMAX) 0.4 MG CAPS capsule Take 0.4 mg by mouth daily as needed (to relax the prostate).   . TRINTELLIX 5 MG TABS tablet Take 1 tablet (5 mg total) by mouth daily.     Allergies:   Testosterone, Fluoxetine, and Requip [ropinirole]   Social History   Socioeconomic History  . Marital status: Divorced    Spouse name: Not on  file  . Number of children: 1  . Years of education: 51  . Highest education level: Not on file  Occupational History  . 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 Financial resource strain: Not on file  . Food insecurity    Worry: Not on file    Inability: Not on file  . Transportation needs    Medical: Not on file    Non-medical: Not on file  Tobacco Use  . Smoking status: Never Smoker  . Smokeless tobacco: Never Used  Substance and Sexual Activity  . Alcohol use: No  . Drug use: No  . Sexual activity: Not Currently  Lifestyle  . Physical activity    Days per week: Not on file    Minutes per session: Not on file  . Stress: Not on file  Relationships  . Social connections    Talks on phone: Not on file  Gets together: Not on file    Attends religious service: Not on file    Active member of club or organization: Not on file    Attends meetings of clubs or organizations: Not on file    Relationship status: Not on file  Other Topics Concern  . Not on file  Social History Narrative   Divorced 2016   Married.   Caffeine use: none         Family History: The patient's family history includes Arthritis in an other family member; Coronary artery disease in an other family member; Heart disease in his brother, father, and mother; Hyperlipidemia in his brother, father, and another family member; Hypertension in an other family member; Stroke in his father. There is no history of Dementia. ROS:   Please see the history of present illness.    All other systems reviewed and are negative.  EKGs/Labs/Other Studies Reviewed:    The following studies were reviewed today:  Echo:96/23/2020  1. Moderate hypokinesis of the left ventricular, entire anteroseptal wall and anterior wall.  2. The left ventricle has moderately reduced systolic function, with an ejection fraction of 35-40%. The cavity size was mild to moderately dilated. Left ventricular  diastolic Doppler parameters are indeterminate. There is abnormal septal motion  consistent with left bundle branch block. Left ventricular diffuse hypokinesis.  3. The right ventricle has moderately reduced systolic function. The cavity was mildly enlarged. There is no increase in right ventricular wall thickness. Right ventricular systolic pressure is normal.  4. No evidence of mitral valve stenosis.  5. The aortic valve is tricuspid. Aortic valve regurgitation is mild by color flow Doppler. No stenosis of the aortic valve.  6. The aortic root and ascending aorta are normal in size and structure.    Recent Labs: 09/26/2018: NT-Pro BNP 490 12/19/2018: ALT 21; BUN 19; Creatinine, Ser 1.05; Potassium 4.5; Sodium 140; TSH 2.010  Recent Lipid Panel    Component Value Date/Time   CHOL 118 01/22/2018 0905   TRIG 98 01/22/2018 0905   TRIG 107 05/09/2010   HDL 40 01/22/2018 0905   CHOLHDL 3.0 01/22/2018 0905   CHOLHDL 3.6 05/20/2015 1410   VLDL 27 05/20/2015 1410   LDLCALC 58 01/22/2018 0905    Physical Exam:    VS:  BP 104/64 (BP Location: Left Arm, Patient Position: Sitting, Cuff Size: Large)   Pulse 93   Temp 98.4 F (36.9 C)   Wt 211 lb 12.8 oz (96.1 kg)   SpO2 96%   BMI 34.19 kg/m     Wt Readings from Last 3 Encounters:  02/20/19 211 lb 12.8 oz (96.1 kg)  01/15/19 208 lb (94.3 kg)  12/19/18 209 lb 3.2 oz (94.9 kg)     GEN: He appears chronically ill has a tremor but alert thought processes normal well nourished, well developed in no acute distress HEENT: Normal NECK: No JVD; No carotid bruits LYMPHATICS: No lymphadenopathy CARDIAC: RRR, no murmurs, rubs, gallops RESPIRATORY:  Clear to auscultation without rales, wheezing or rhonchi  ABDOMEN: Soft, non-tender, non-distended MUSCULOSKELETAL:  No edema; No deformity  SKIN: Warm and dry NEUROLOGIC:  Alert and oriented x 3 PSYCHIATRIC:  Normal affect    Signed, Shirlee More, MD  02/20/2019 1:31 PM    [facility]

## 2019-02-20 ENCOUNTER — Other Ambulatory Visit: Payer: Self-pay

## 2019-02-20 ENCOUNTER — Ambulatory Visit (INDEPENDENT_AMBULATORY_CARE_PROVIDER_SITE_OTHER): Payer: Medicare Other | Admitting: Cardiology

## 2019-02-20 ENCOUNTER — Encounter: Payer: Self-pay | Admitting: Cardiology

## 2019-02-20 VITALS — BP 104/64 | HR 93 | Temp 98.4°F | Wt 211.8 lb

## 2019-02-20 DIAGNOSIS — E782 Mixed hyperlipidemia: Secondary | ICD-10-CM

## 2019-02-20 DIAGNOSIS — I255 Ischemic cardiomyopathy: Secondary | ICD-10-CM | POA: Diagnosis not present

## 2019-02-20 DIAGNOSIS — I25119 Atherosclerotic heart disease of native coronary artery with unspecified angina pectoris: Secondary | ICD-10-CM | POA: Diagnosis not present

## 2019-02-20 MED ORDER — ENTRESTO 24-26 MG PO TABS: 1.0000 | ORAL_TABLET | Freq: Two times a day (BID) | ORAL | 2 refills | Status: DC

## 2019-02-20 MED ORDER — METOPROLOL TARTRATE 25 MG PO TABS: 12.5000 mg | ORAL_TABLET | Freq: Two times a day (BID) | ORAL | 1 refills | Status: DC

## 2019-02-20 MED ORDER — FUROSEMIDE 40 MG PO TABS: 40.0000 mg | ORAL_TABLET | Freq: Every day | ORAL | 1 refills | Status: DC

## 2019-02-20 NOTE — Patient Instructions (Signed)
Medication Instructions:  Your physician has recommended you make the following change in your medication:   DECREASE: Entresto 24/26 mg: Take 1 tab twice daily  If you need a refill on your cardiac medications before your next appointment, please call your pharmacy.   Lab work: Your physician recommends that you return for lab work in: TODAY CMP,Lipid,Pro BNP  If you have labs (blood work) drawn today and your tests are completely normal, you will receive your results only by: Marland Kitchen MyChart Message (if you have MyChart) OR . A paper copy in the mail If you have any lab test that is abnormal or we need to change your treatment, we will call you to review the results.  Testing/Procedures:  None  Follow-Up: At [facility], you and your health needs are our priority.  As part of our continuing mission to provide you with exceptional heart care, we have created designated Provider Care Teams.  These Care Teams include your primary Cardiologist (physician) and Advanced Practice Providers (APPs -  Physician Assistants and Nurse Practitioners) who all work together to provide you with the care you need, when you need it. You will need a follow up appointment in 3 months. Any Other Special Instructions Will Be Listed Below (If Applicable).

## 2019-02-21 LAB — LIPID PANEL
Chol/HDL Ratio: 4.8 ratio (ref 0.0–5.0)
Cholesterol, Total: 189 mg/dL (ref 100–199)
HDL: 39 mg/dL — ABNORMAL LOW (ref >39)
LDL Calculated: 114 mg/dL — ABNORMAL HIGH (ref 0–99)
Triglycerides: 180 mg/dL — ABNORMAL HIGH (ref 0–149)
VLDL Cholesterol Cal: 36 mg/dL (ref 5–40)

## 2019-02-21 LAB — PRO B NATRIURETIC PEPTIDE: NT-Pro BNP: 272 pg/mL (ref 0–376)

## 2019-02-21 LAB — COMPREHENSIVE METABOLIC PANEL
ALT: 21 IU/L (ref 0–44)
AST: 21 IU/L (ref 0–40)
Albumin/Globulin Ratio: 1.9 (ref 1.2–2.2)
Albumin: 4.4 g/dL (ref 3.7–4.7)
Alkaline Phosphatase: 77 IU/L (ref 39–117)
BUN/Creatinine Ratio: 15 (ref 10–24)
BUN: 16 mg/dL (ref 8–27)
Bilirubin Total: 0.4 mg/dL (ref 0.0–1.2)
CO2: 20 mmol/L (ref 20–29)
Calcium: 9.3 mg/dL (ref 8.6–10.2)
Chloride: 103 mmol/L (ref 96–106)
Creatinine, Ser: 1.08 mg/dL (ref 0.76–1.27)
GFR calc Af Amer: 78 mL/min/{1.73_m2} (ref >59)
GFR calc non Af Amer: 67 mL/min/{1.73_m2} (ref >59)
Globulin, Total: 2.3 g/dL (ref 1.5–4.5)
Glucose: 132 mg/dL — ABNORMAL HIGH (ref 65–99)
Potassium: 4.1 mmol/L (ref 3.5–5.2)
Sodium: 140 mmol/L (ref 134–144)
Total Protein: 6.7 g/dL (ref 6.0–8.5)

## 2019-02-26 ENCOUNTER — Telehealth: Payer: Self-pay

## 2019-02-26 MED ORDER — PRAVASTATIN SODIUM 20 MG PO TABS: 20.0000 mg | ORAL_TABLET | Freq: Every evening | ORAL | 1 refills | Status: DC

## 2019-02-26 NOTE — Telephone Encounter (Signed)
Patient informed of results.  Patient is willing to try pravastatin 20mg  one tablet daily and will contact our office with any complaints or problems.  Patient agreed to plan and verbalized understanding.

## 2019-03-04 DIAGNOSIS — H11043 Peripheral pterygium, stationary, bilateral: Secondary | ICD-10-CM | POA: Diagnosis not present

## 2019-03-04 DIAGNOSIS — H2513 Age-related nuclear cataract, bilateral: Secondary | ICD-10-CM | POA: Diagnosis not present

## 2019-03-04 DIAGNOSIS — H04123 Dry eye syndrome of bilateral lacrimal glands: Secondary | ICD-10-CM | POA: Diagnosis not present

## 2019-03-04 DIAGNOSIS — H1045 Other chronic allergic conjunctivitis: Secondary | ICD-10-CM | POA: Diagnosis not present

## 2019-03-25 ENCOUNTER — Other Ambulatory Visit: Payer: Self-pay | Admitting: Cardiology

## 2019-03-25 DIAGNOSIS — I2510 Atherosclerotic heart disease of native coronary artery without angina pectoris: Secondary | ICD-10-CM

## 2019-04-17 ENCOUNTER — Telehealth: Payer: Self-pay | Admitting: Cardiology

## 2019-04-17 NOTE — Telephone Encounter (Signed)
Please call patient about getting another PCP. He would like a recommendation from his area.

## 2019-04-18 NOTE — Telephone Encounter (Signed)
Patient was told to contact [facility] and was given a list of several of the physicians names.  Patient will contact their office for an appointment.

## 2019-04-18 NOTE — Telephone Encounter (Signed)
Left message on voicemail for patient to return call to office.   Will continue efforts.

## 2019-04-23 NOTE — Progress Notes (Deleted)
Shawn Meza was seen today in the movement disorders clinic for neurologic consultation at the request of Cyndy Freeze, MD.  The consultation is for the evaluation of Parkinson's disease.  Patient was previously under the care of Dr. Jaynee Eagles.  I have reviewed her records.  He started to see her in December, 2016, at which point the complaints were memory change.  His Moca was 24/30 at the time.  She felt that his depression was contributing to his memory change.  She encouraged he restart the Prozac.  He was started on donepezil, which he reports caused him to be mean. He was then not seen again until May, 2018.  At that point, he reported improvement in memory with treatment for his depression.  He was back to see Dr. Jaynee Eagles in May, 2018 because of tremor in the left hand that had started 6 to 8 months prior.  She felt he had parkinsonian symptoms.  She recommended levodopa and recommended a DaTscan.  DaTscan was completed on Dec 20, 2016 demonstrating decreased activity in the right putamen compared to that of the left.  She saw him back in follow-up in June, 2018 and the patient was only able to take half a pill and was still finding himself sleepy per records but pt states that it caused GI upset.  Therefore, it was discontinued and ropinirole was initiated and work to 1 mg 3 times per day.  Pt was worried about SE.  He took the titration dose for a week and ultimately d/c it.     Specific Symptoms:  Tremor: Yes.  , L hand and rarely in the L leg Family hx of similar:  Yes.  , brother with "shaking" but hes never been to dr Voice: weaker and more hoarse Sleep: gets to bed late and then trouble getting up in the AM  Vivid Dreams:  No.  Acting out dreams:  No. Wet Pillows: Yes.   Postural symptoms:  Yes.   , better after rehab at [facility]?  No. Bradykinesia symptoms: shuffling gait, slow movements and difficulty getting out of a chair Loss of smell:  No. Loss of taste:  No. Urinary  Incontinence:  No., had frequency but better with medication Difficulty Swallowing:  No. Handwriting, micrographia: No. Trouble with ADL's:  No. but hes slower  Trouble buttoning clothing: No. Depression:  Yes.   but better lately Memory changes:  Yes.   Hallucinations:  No. (only one time post op)  visual distortions: No. N/V:  No. Lightheaded:  No.  Syncope: No. Diplopia:  No. Dyskinesia:  No. Prior exposure to reglan/antipsychotics: No.  04/25/19 update: Patient seen today in follow-up for Parkinson's disease.  Patient is on no medication, by his choice.  Pt denies falls.  Pt denies lightheadedness, near syncope.  No hallucinations.  Mood has been good.  PREVIOUS MEDICATIONS: Sinemet and Requip  ALLERGIES:   Allergies  Allergen Reactions  . Testosterone Other (See Comments)    ABDOMINAL PAIN and cramping  . Fluoxetine Other (See Comments)    Caused depression and aggression  . Requip [Ropinirole] Nausea Only    CURRENT MEDICATIONS:  Outpatient Encounter Medications as of 04/25/2019  Medication Sig  . aspirin 81 MG tablet Take 81 mg by mouth daily.   . clopidogrel (PLAVIX) 75 MG tablet TAKE 1 TABLET BY MOUTH ONCE DAILY.  . ergocalciferol (VITAMIN D2) 1.25 MG (50000 UT) capsule Take 1 capsule (50,000 Units total) by mouth once a week.  Marland Kitchen  esomeprazole (NEXIUM) 40 MG capsule Take 1 capsule by mouth daily.  . fluocinonide-emollient (LIDEX-E) 0.05 % cream Apply 1 application topically 2 (two) times daily.  . furosemide (LASIX) 40 MG tablet Take 1 tablet (40 mg total) by mouth daily.  Marland Kitchen gabapentin (NEURONTIN) 100 MG capsule Take 1 capsule (100 mg total) by mouth at bedtime.  . metoprolol tartrate (LOPRESSOR) 25 MG tablet Take 0.5 tablets (12.5 mg total) by mouth 2 (two) times daily.  . Multiple Vitamin (MULTI-VITAMINS) TABS Take 1 tablet by mouth daily.   . nitroGLYCERIN (NITROSTAT) 0.4 MG SL tablet Place 1 tablet (0.4 mg total) under the tongue every 5 (five) minutes as needed.  Chest pain  . Polyethyl Glycol-Propyl Glycol (SYSTANE) 0.4-0.3 % GEL ophthalmic gel Place 1 application into both eyes at bedtime as needed.  . pravastatin (PRAVACHOL) 20 MG tablet Take 1 tablet (20 mg total) by mouth every evening.  . sacubitril-valsartan (ENTRESTO) 24-26 MG Take 1 tablet by mouth 2 (two) times daily.  . tamsulosin (FLOMAX) 0.4 MG CAPS capsule Take 0.4 mg by mouth daily as needed (to relax the prostate).   . TRINTELLIX 5 MG TABS tablet Take 1 tablet (5 mg total) by mouth daily.   No facility-administered encounter medications on file as of 04/25/2019.     PAST MEDICAL HISTORY:   Past Medical History:  Diagnosis Date  . Arthritis   . BPH (benign prostatic hypertrophy)   . Chronic coronary artery disease   . Colon cancer ([place])   . Elevated PSA   . Erectile dysfunction   . Essential hypertension   . Essential tremor   . GERD (gastroesophageal reflux disease)   . Headache(784.0)   . Hiatal hernia   . Hypercholesterolemia   . Long-term use of aspirin therapy   . Major depression, chronic   . Medial meniscus tear 10/11/2011  . Metabolic syndrome   . Morbid obesity ([place])   . Nephrolithiasis    hx of  . NSTEMI (non-ST elevated myocardial infarction) ([place])   . Parkinson's disease ([place])   . S/P CABG (coronary artery bypass graft)   . Transient ischemic attack    hx of  . Trochanteric bursitis of right hip     PAST SURGICAL HISTORY:   Past Surgical History:  Procedure Laterality Date  . CARDIAC CATHETERIZATION  5/12,1/13   4 stents placed  . COLON SURGERY    . CORONARY ARTERY BYPASS GRAFT    . KNEE ARTHROSCOPY  10/11/2011   Procedure: ARTHROSCOPY KNEE;  Surgeon: Lorn Junes, MD;  Location: [facility];  Service: Orthopedics;  Laterality: Left;  Left Knee Arthroscopy with Medial and Lateral Partial Menisectomy, Chondroplasty  . LEFT HEART CATHETERIZATION WITH CORONARY ANGIOGRAM N/A 08/25/2011   Procedure: LEFT HEART CATHETERIZATION WITH CORONARY  ANGIOGRAM;  Surgeon: Burnell Blanks, MD;  Location: [facility] CATH LAB;  Service: Cardiovascular;  Laterality: N/A;  . LITHOTRIPSY    . STERIOD INJECTION  10/11/2011   Procedure: STEROID INJECTION;  Surgeon: Lorn Junes, MD;  Location: [facility];  Service: Orthopedics;  Laterality: Right;  Steroid Injection Second Toe  . TRANSURETHRAL RESECTION OF PROSTATE    . URETHRAL DILATION      SOCIAL HISTORY:   Social History   Socioeconomic History  . Marital status: Divorced    Spouse name: Not on file  . Number of children: 1  . Years of education: 60  . Highest education level: Not on file  Occupational History  .  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 Financial resource strain: Not on file  . Food insecurity    Worry: Not on file    Inability: Not on file  . Transportation needs    Medical: Not on file    Non-medical: Not on file  Tobacco Use  . Smoking status: Never Smoker  . Smokeless tobacco: Never Used  Substance and Sexual Activity  . Alcohol use: No  . Drug use: No  . Sexual activity: Not Currently  Lifestyle  . Physical activity    Days per week: Not on file    Minutes per session: Not on file  . Stress: Not on file  Relationships  . Social Herbalist on phone: Not on file    Gets together: Not on file    Attends religious service: Not on file    Active member of club or organization: Not on file    Attends meetings of clubs or organizations: Not on file    Relationship status: Not on file  . Intimate partner violence    Fear of current or ex partner: Not on file    Emotionally abused: Not on file    Physically abused: Not on file    Forced sexual activity: Not on file  Other Topics Concern  . Not on file  Social History Narrative   Divorced 2016   Married.   Caffeine use: none        FAMILY HISTORY:   Family Status  Relation Name Status  . Mother  Deceased at age 53  . Father  Deceased at  age 61  . Brother  Alive  . Brother  Alive  . Other  (Not Specified)  . Other  (Not Specified)  . Other  (Not Specified)  . Other  (Not Specified)  . Neg Hx  (Not Specified)    ROS:  ROS  PHYSICAL EXAMINATION:    VITALS:   There were no vitals filed for this visit.  GEN:  The patient appears stated age and is in NAD. HEENT:  Normocephalic, atraumatic.  The mucous membranes are moist. The superficial temporal arteries are without ropiness or tenderness. CV:  RRR Lungs:  CTAB Neck/HEME:  There are no carotid bruits bilaterally.  Neurological examination:  Orientation: The patient is alert and oriented x3. Fund of knowledge is appropriate.  Recent and remote memory are intact.  Attention and concentration are normal.    Able to name objects and repeat phrases. Cranial nerves: There is good facial symmetry. Pupils are equal round and reactive to light bilaterally. Fundoscopic exam reveals clear margins bilaterally. Extraocular muscles are intact. The visual fields are full to confrontational testing. The speech is fluent and clear. Soft palate rises symmetrically and there is no tongue deviation. Hearing is intact to conversational tone. Sensation: Sensation is intact to light and pinprick throughout (facial, trunk, extremities). Vibration is intact at the bilateral big toe. There is no extinction with double simultaneous stimulation. There is no sensory dermatomal level identified. Motor: Strength is 5/5 in the bilateral upper and lower extremities.   Shoulder shrug is equal and symmetric.  There is no pronator drift. Deep tendon reflexes: Deep tendon reflexes are 2+-3/4 at the bilateral biceps, triceps, brachioradialis, patella and trace at the bilateral achilles. Plantar responses are downgoing bilaterally.  Movement examination: Tone: There is normal tone in the bilateral upper extremities.  There is mild increased tone in the LLE.  There is normal tone in the RLE Abnormal movements:  there is LUE resting tremor.  There is bilateral lower extremity rest tremor Coordination:  There is  decremation with RAM's, with any form of RAMS, including alternating supination and pronation of the forearm, hand opening and closing, finger taps, heel taps and toe taps on the left Gait and Station: The patient has no difficulty arising out of a deep-seated chair without the use of the hands. The patient's stride length is normal.  The patient has a neg pull test.      Labs: Lab work is completed on March 20, 2018.  Hemoglobin A1c was 5.6.  B12 was 311.  White blood cells were 8.8, hemoglobin 13.5, hematocrit 41, platelets 263.  TSH in May, 2019 was 2.004.  In February, 2019, sodium was 141, potassium 4.0, chloride 108, CO2 27, BUN 20 and creatinine 1.1.  Glucose is 104.  AST 22, ALT 26.  ASSESSMENT/PLAN:  1.  Idiopathic Parkinson's disease.  The patient has tremor, bradykinesia, rigidity and mild postural instability.  -We discussed the diagnosis as well as pathophysiology of the disease.  We discussed treatment options as well as prognostic indicators.  Patient education was provided.  -We discussed that it used to be thought that levodopa would increase risk of melanoma but now it is believed that Parkinsons itself likely increases risk of melanoma. he is to get regular skin checks.  -Greater than 50% of the 60 minute visit was spent in counseling answering questions and talking about what to expect now as well as in the future.  We talked about medication options as well as potential future surgical options.  We talked about safety in the home.  -Pt is very leery about medication.  If we tried medicine, we would have to try the extended release form of levodopa, primarily because of the fact that he had nausea with immediate release, but also the fact that he is convinced he will have side effects with further medication.  He ultimately decided to hold off on further medication for right now.   -he has just finished PT at [facility].  Encouraged safe, cardiovascular exercise, but he would need approval from the cardiologist.  -We discussed community resources in the area including patient support groups and community exercise programs for PD and pt education was provided to the patient.  2.  B12 deficiency  -Recommend the patient take oral B12, 1000 mcg daily  3.  Depression  -Patient under the care of primary care.  He just started Lexapro and trazodone on April 30, 2018.  Would recommend counseling.  He has seen a psychologist in the past and has found it helpful.  4.  Hyperreflexia  -has no neck or back pain so held on further neuroimaging.  In addition, neuro exam non focal and nonlateralizing besides for PD exam  5.  Follow up is anticipated in the next 6 months, sooner should new neurologic issues arise.  Much greater than 50% of this visit was spent in counseling and coordinating care.  Total face to face time:  60 min  Cc:  Scot Jun, FNP

## 2019-04-25 ENCOUNTER — Ambulatory Visit: Payer: Medicare Other | Admitting: Neurology

## 2019-04-25 ENCOUNTER — Other Ambulatory Visit: Payer: Self-pay

## 2019-04-29 NOTE — Progress Notes (Signed)
Shawn Meza was seen today in the movement disorders clinic for neurologic consultation at the request of [name]*.  The consultation is for the evaluation of Parkinson's disease.  Patient was previously under the care of Dr. Jaynee Eagles.  I have reviewed her records.  He started to see her in December, 2016, at which point the complaints were memory change.  His Moca was 24/30 at the time.  She felt that his depression was contributing to his memory change.  She encouraged he restart the Prozac.  He was started on donepezil, which he reports caused him to be mean. He was then not seen again until May, 2018.  At that point, he reported improvement in memory with treatment for his depression.  He was back to see Dr. Jaynee Eagles in May, 2018 because of tremor in the left hand that had started 6 to 8 months prior.  She felt he had parkinsonian symptoms.  She recommended levodopa and recommended a DaTscan.  DaTscan was completed on Dec 20, 2016 demonstrating decreased activity in the right putamen compared to that of the left.  She saw him back in follow-up in June, 2018 and the patient was only able to take half a pill and was still finding himself sleepy per records but pt states that it caused GI upset.  Therefore, it was discontinued and ropinirole was initiated and work to 1 mg 3 times per day.  Pt was worried about SE.  He took the titration dose for a week and ultimately d/c it.     Specific Symptoms:  Tremor: Yes.  , L hand and rarely in the L leg Family hx of similar:  Yes.  , brother with "shaking" but hes never been to dr Voice: weaker and more hoarse Sleep: gets to bed late and then trouble getting up in the AM  Vivid Dreams:  No.  Acting out dreams:  No. Wet Pillows: Yes.   Postural symptoms:  Yes.   , better after rehab at [facility]?  No. Bradykinesia symptoms: shuffling gait, slow movements and difficulty getting out of a chair Loss of smell:  No. Loss of taste:  No.  Urinary Incontinence:  No., had frequency but better with medication Difficulty Swallowing:  No. Handwriting, micrographia: No. Trouble with ADL's:  No. but hes slower  Trouble buttoning clothing: No. Depression:  Yes.   but better lately Memory changes:  Yes.   Hallucinations:  No. (only one time post op)  visual distortions: No. N/V:  No. Lightheaded:  No.  Syncope: No. Diplopia:  No. Dyskinesia:  No. Prior exposure to reglan/antipsychotics: No.  04/30/19 update: Patient seen today in follow-up for Parkinson's disease.  Patient is on no medication, by his choice.  Tremor is just a bit worse, esp when "I get nervous."   Pt denies falls.  Pt has occasional lightheadedness, but no near syncope.  No hallucinations.  Mood has been depressed.  When I last saw him, he was on antidepressants.  He does not know what happened to those.  He admits that he often starts medication and just "forgets them."  States that he does not really "believe in COVID."  PREVIOUS MEDICATIONS: Sinemet and Requip  ALLERGIES:   Allergies  Allergen Reactions  . Testosterone Other (See Comments)    ABDOMINAL PAIN and cramping  . Fluoxetine Other (See Comments)    Caused depression and aggression  . Requip [Ropinirole] Nausea Only    CURRENT MEDICATIONS:  Outpatient  Encounter Medications as of 04/30/2019  Medication Sig  . aspirin 81 MG tablet Take 81 mg by mouth daily.   . clopidogrel (PLAVIX) 75 MG tablet TAKE 1 TABLET BY MOUTH ONCE DAILY.  . ergocalciferol (VITAMIN D2) 1.25 MG (50000 UT) capsule Take 1 capsule (50,000 Units total) by mouth once a week.  . esomeprazole (NEXIUM) 40 MG capsule Take 1 capsule by mouth daily.  . fluocinonide-emollient (LIDEX-E) 0.05 % cream Apply 1 application topically 2 (two) times daily.  . furosemide (LASIX) 40 MG tablet Take 1 tablet (40 mg total) by mouth daily.  Marland Kitchen gabapentin (NEURONTIN) 100 MG capsule Take 1 capsule (100 mg total) by mouth at bedtime.  . metoprolol  tartrate (LOPRESSOR) 25 MG tablet Take 0.5 tablets (12.5 mg total) by mouth 2 (two) times daily.  . Multiple Vitamin (MULTI-VITAMINS) TABS Take 1 tablet by mouth daily.   . nitroGLYCERIN (NITROSTAT) 0.4 MG SL tablet Place 1 tablet (0.4 mg total) under the tongue every 5 (five) minutes as needed. Chest pain  . Polyethyl Glycol-Propyl Glycol (SYSTANE) 0.4-0.3 % GEL ophthalmic gel Place 1 application into both eyes at bedtime as needed.  . pravastatin (PRAVACHOL) 20 MG tablet Take 1 tablet (20 mg total) by mouth every evening.  . sacubitril-valsartan (ENTRESTO) 24-26 MG Take 1 tablet by mouth 2 (two) times daily.  . tamsulosin (FLOMAX) 0.4 MG CAPS capsule Take 0.4 mg by mouth daily as needed (to relax the prostate).   . TRINTELLIX 5 MG TABS tablet Take 1 tablet (5 mg total) by mouth daily.   No facility-administered encounter medications on file as of 04/30/2019.     PAST MEDICAL HISTORY:   Past Medical History:  Diagnosis Date  . Arthritis   . BPH (benign prostatic hypertrophy)   . Chronic coronary artery disease   . Colon cancer ([place])   . Elevated PSA   . Erectile dysfunction   . Essential hypertension   . Essential tremor   . GERD (gastroesophageal reflux disease)   . Headache(784.0)   . Hiatal hernia   . Hypercholesterolemia   . Long-term use of aspirin therapy   . Major depression, chronic   . Medial meniscus tear 10/11/2011  . Metabolic syndrome   . Morbid obesity ([place])   . Nephrolithiasis    hx of  . NSTEMI (non-ST elevated myocardial infarction) ([place])   . Parkinson's disease ([place])   . S/P CABG (coronary artery bypass graft)   . Transient ischemic attack    hx of  . Trochanteric bursitis of right hip     PAST SURGICAL HISTORY:   Past Surgical History:  Procedure Laterality Date  . CARDIAC CATHETERIZATION  5/12,1/13   4 stents placed  . COLON SURGERY    . CORONARY ARTERY BYPASS GRAFT    . KNEE ARTHROSCOPY  10/11/2011   Procedure: ARTHROSCOPY KNEE;  Surgeon: Lorn Junes, MD;  Location: [facility];  Service: Orthopedics;  Laterality: Left;  Left Knee Arthroscopy with Medial and Lateral Partial Menisectomy, Chondroplasty  . LEFT HEART CATHETERIZATION WITH CORONARY ANGIOGRAM N/A 08/25/2011   Procedure: LEFT HEART CATHETERIZATION WITH CORONARY ANGIOGRAM;  Surgeon: Burnell Blanks, MD;  Location: [facility] CATH LAB;  Service: Cardiovascular;  Laterality: N/A;  . LITHOTRIPSY    . STERIOD INJECTION  10/11/2011   Procedure: STEROID INJECTION;  Surgeon: Lorn Junes, MD;  Location: [facility];  Service: Orthopedics;  Laterality: Right;  Steroid Injection Second Toe  . TRANSURETHRAL RESECTION OF PROSTATE    .  URETHRAL DILATION      SOCIAL HISTORY:   Social History   Socioeconomic History  . Marital status: Divorced    Spouse name: Not on file  . Number of children: 1  . Years of education: 47  . Highest education level: High school graduate  Occupational History  . 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 Financial resource strain: Not on file  . Food insecurity    Worry: Not on file    Inability: Not on file  . Transportation needs    Medical: Not on file    Non-medical: Not on file  Tobacco Use  . Smoking status: Never Smoker  . Smokeless tobacco: Never Used  Substance and Sexual Activity  . Alcohol use: No  . Drug use: No  . Sexual activity: Not Currently  Lifestyle  . Physical activity    Days per week: Not on file    Minutes per session: Not on file  . Stress: Not on file  Relationships  . Social Herbalist on phone: Not on file    Gets together: Not on file    Attends religious service: Not on file    Active member of club or organization: Not on file    Attends meetings of clubs or organizations: Not on file    Relationship status: Not on file  . Intimate partner violence    Fear of current or ex partner: Not on file    Emotionally abused: Not on file     Physically abused: Not on file    Forced sexual activity: Not on file  Other Topics Concern  . Not on file  Social History Narrative   Divorced 2016   Married.   Caffeine use: none        FAMILY HISTORY:   Family Status  Relation Name Status  . Mother  Deceased at age 90  . Father  Deceased at age 90  . Brother  Alive  . Brother  Alive  . Other  (Not Specified)  . Other  (Not Specified)  . Other  (Not Specified)  . Other  (Not Specified)  . Daughter  Alive  . Neg Hx  (Not Specified)    ROS:  Review of Systems  Constitutional: Negative.   HENT: Negative.   Eyes: Negative.   Respiratory: Negative.   Cardiovascular: Negative.   Gastrointestinal: Negative.   Genitourinary: Negative.   Musculoskeletal: Positive for back pain and joint pain.  Skin: Negative.     PHYSICAL EXAMINATION:    VITALS:   Vitals:   04/30/19 1451  BP: 117/76  Pulse: 88  SpO2: 97%  Weight: 212 lb 12.8 oz (96.5 kg)  Height: '5\' 6"'  (1.676 m)    GEN:  The patient appears stated age and is in NAD. HEENT:  Normocephalic, atraumatic.  The mucous membranes are moist. The superficial temporal arteries are without ropiness or tenderness. CV:  RRR Lungs:  CTAB Neck/HEME:  There are no carotid bruits bilaterally.  Neurological examination:  Orientation: The patient is alert and oriented x3. Cranial nerves: There is good facial symmetry. The speech is fluent and clear. Soft palate rises symmetrically and there is no tongue deviation. Hearing is intact to conversational tone. Sensation: Sensation is intact to light touch throughout Motor: Strength is 5/5 in the bilateral upper and lower extremities.   Shoulder shrug is equal and symmetric.  There is no pronator drift.  Movement examination: Tone:  There is normal tone in the bilateral upper extremities.  There is mild increased tone in the LLE.  There is normal tone in the RLE Abnormal movements: there is LUE resting tremor.  There is bilateral lower  extremity rest tremor Coordination:  There is  decremation with RAM's, with any form of RAMS, including alternating supination and pronation of the forearm, hand opening and closing, finger taps, heel taps and toe taps on the left (same as prior) Gait and Station: The patient is slow to get out of the chair.  He has decreased arm swing on the left.  He is more short stepped than previous.  Labs: Lab work is completed on March 20, 2018.  Hemoglobin A1c was 5.6.  B12 was 311.  White blood cells were 8.8, hemoglobin 13.5, hematocrit 41, platelets 263.  TSH in May, 2019 was 2.004.  In February, 2019, sodium was 141, potassium 4.0, chloride 108, CO2 27, BUN 20 and creatinine 1.1.  Glucose is 104.  AST 22, ALT 26.  ASSESSMENT/PLAN:  1.  Idiopathic Parkinson's disease.  The patient has tremor, bradykinesia, rigidity and mild postural instability.  -We discussed the diagnosis as well as pathophysiology of the disease.  We discussed treatment options as well as prognostic indicators.  Patient education was provided.  -We discussed that it used to be thought that levodopa would increase risk of melanoma but now it is believed that Parkinsons itself likely increases risk of melanoma. he is to get regular skin checks.  -Pt is very leery about medication.  He is convinced he will have SE because he did with carbidopa/levodopa IR and ropinirole.  He is willing to try the carbidopa/levodopa CR and work to tid.  -wants referral to neurorehab for PT.  Provided today  2.  B12 deficiency  -Recommend the patient take oral B12, 1000 mcg daily.  Was supposed to do that last time and forgot.  3.  Depression  -Patient under the care of primary care.  Somehow, his antidepressants got dropped.  Recommended counseling and met with my social worker in that regard today.    4.  Hyperreflexia  -has developed back pain.  We will do MRI lumbar spine.  5.  Follow up is anticipated in the next 4-6 months, sooner should new  neurologic issues arise.  Much greater than 50% of this visit was spent in counseling and coordinating care.  Total face to face time:  40 min  Cc:  [name], DO

## 2019-04-30 ENCOUNTER — Ambulatory Visit (INDEPENDENT_AMBULATORY_CARE_PROVIDER_SITE_OTHER): Payer: Medicare Other | Admitting: Clinical

## 2019-04-30 ENCOUNTER — Ambulatory Visit (INDEPENDENT_AMBULATORY_CARE_PROVIDER_SITE_OTHER): Payer: Medicare Other | Admitting: Neurology

## 2019-04-30 ENCOUNTER — Other Ambulatory Visit: Payer: Self-pay

## 2019-04-30 ENCOUNTER — Encounter: Payer: Self-pay | Admitting: Neurology

## 2019-04-30 VITALS — BP 117/76 | HR 88 | Ht 66.0 in | Wt 212.8 lb

## 2019-04-30 DIAGNOSIS — F331 Major depressive disorder, recurrent, moderate: Secondary | ICD-10-CM

## 2019-04-30 DIAGNOSIS — G20 Parkinson's disease: Secondary | ICD-10-CM

## 2019-04-30 DIAGNOSIS — M545 Low back pain, unspecified: Secondary | ICD-10-CM

## 2019-04-30 DIAGNOSIS — E538 Deficiency of other specified B group vitamins: Secondary | ICD-10-CM

## 2019-04-30 DIAGNOSIS — F330 Major depressive disorder, recurrent, mild: Secondary | ICD-10-CM | POA: Diagnosis not present

## 2019-04-30 MED ORDER — CARBIDOPA-LEVODOPA ER 25-100 MG PO TBCR: 1.0000 | EXTENDED_RELEASE_TABLET | Freq: Three times a day (TID) | ORAL | 1 refills | Status: DC

## 2019-04-30 NOTE — Patient Instructions (Addendum)
Week 1:  Take carbidopa/levodopa 25/100 CR at 8am Week 2:  Take carbidopa/levodopa 25/100 CR at 8am and noon Week 3:  Take carbidopa/levodopa 25/100 at 8am/noon and 4pm  Take your medication with a carbohydrate (crackers, toast, nongreek yogurt, etc) but avoid protein for about 30 min before you take your medication  You have been referred to Neuro Rehab for therapy. They will call you directly to schedule an appointment.  Please call [phone] if you do not hear from them.   Take b12 - 1076mcg daily.  A referral to [facility] has been placed for your MRI someone will contact you directly to schedule your appt. They are located at [place]. Please contact them directly by calling [phone] with any questions regarding your referral.

## 2019-05-01 ENCOUNTER — Other Ambulatory Visit: Payer: Self-pay | Admitting: Cardiology

## 2019-05-02 ENCOUNTER — Telehealth: Payer: Self-pay | Admitting: Clinical

## 2019-05-02 NOTE — BH Specialist Note (Addendum)
Patient presents with h/o dx Mood disturbance, dx unclear. Pt is presently experiencing sx of depression exacerbated by social isolation and divorce ~4 yrs ago.  [facility] provided supportive counseling as pt detailed experience with 5 heart bypasses, loneliness following divorce (married 60 yrs) and distress re PD dx, pt exhibits limited education re PD and would benefit from engagement in PD support groups. [facility] provided SSRI psychoeducation including the need to be taken daily for effectiveness. Pt was receptive to [facility] intervention today. Pt would benefit from engagement with OPT and medication adherence to Trintellix with ongoing psychiatry medication management.    1. Patient to follow up with [facility] in: as needed  2. Medication Recommendation: take Trintellix daily  3. Behavioral Recommendation(s):A: f/u w OPT & Psych  B: engage with RSB

## 2019-05-02 NOTE — Telephone Encounter (Signed)
Coordinated pt participation in [employer] Orientation on 05/02/2019

## 2019-05-06 ENCOUNTER — Encounter: Payer: Self-pay | Admitting: Family Medicine

## 2019-05-06 ENCOUNTER — Other Ambulatory Visit: Payer: Self-pay

## 2019-05-06 ENCOUNTER — Ambulatory Visit (INDEPENDENT_AMBULATORY_CARE_PROVIDER_SITE_OTHER): Payer: Medicare Other | Admitting: Family Medicine

## 2019-05-06 VITALS — BP 108/64 | HR 85 | Temp 97.8°F | Ht 66.0 in | Wt 210.5 lb

## 2019-05-06 DIAGNOSIS — Z23 Encounter for immunization: Secondary | ICD-10-CM | POA: Diagnosis not present

## 2019-05-06 DIAGNOSIS — R053 Chronic cough: Secondary | ICD-10-CM

## 2019-05-06 DIAGNOSIS — E559 Vitamin D deficiency, unspecified: Secondary | ICD-10-CM | POA: Diagnosis not present

## 2019-05-06 DIAGNOSIS — H5789 Other specified disorders of eye and adnexa: Secondary | ICD-10-CM

## 2019-05-06 DIAGNOSIS — R05 Cough: Secondary | ICD-10-CM

## 2019-05-06 LAB — VITAMIN D 25 HYDROXY (VIT D DEFICIENCY, FRACTURES): VITD: 31.62 ng/mL (ref 30.00–100.00)

## 2019-05-06 MED ORDER — PANTOPRAZOLE SODIUM 40 MG PO TBEC: 40.0000 mg | DELAYED_RELEASE_TABLET | Freq: Every day | ORAL | 3 refills | Status: DC

## 2019-05-06 NOTE — Patient Instructions (Addendum)
Keep the diet clean and stay active.  I think this is related to your reflux. Let's change the medicine. Stop the Nexium or otc meds.   The only lifestyle changes that have data behind them are weight loss for the overweight/obese and elevating the head of the bed. Finding out which foods/positions are triggers is important.   Let us know if you need anything.

## 2019-05-06 NOTE — Progress Notes (Signed)
Chief Complaint  Patient presents with  . New Patient (Initial Visit)       New Patient Visit SUBJECTIVE: HPI: Shawn Meza is an 73 y.o.male who is being seen for establishing care.  Has had a hacking cough for the past several years. Someone thought it was related to his heart, but he follows with cardiology. No SOB. + hx of reflux or allergies. Takes Nexium for GERD that helps a little. Nothing for the allergies. Cough is worse after meals. No runny nose. Not on an ACEi.  He is not have any runny/stuffy nose, itchy or watery eyes, ear pain/drainage from the ears, or ear fullness.  Patient has a history of red eyes.  He was evaluated by an ophthalmologist near the [facility].  He is requesting a second opinion.  He was sent onto another subspecialist but apparently there were communication issues between him and his initial eye provider.   Allergies  Allergen Reactions  . Testosterone Other (See Comments)    ABDOMINAL PAIN and cramping  . Fluoxetine Other (See Comments)    Caused depression and aggression  . Requip [Ropinirole] Nausea Only    Past Medical History:  Diagnosis Date  . Arthritis   . BPH (benign prostatic hypertrophy)   . Chronic coronary artery disease   . Colon cancer ([place])   . Elevated PSA   . Erectile dysfunction   . Essential hypertension   . Essential tremor   . GERD (gastroesophageal reflux disease)   . Headache(784.0)   . Hiatal hernia   . Hypercholesterolemia   . Long-term use of aspirin therapy   . Major depression, chronic   . Medial meniscus tear 10/11/2011  . Metabolic syndrome   . Morbid obesity ([place])   . Nephrolithiasis    hx of  . NSTEMI (non-ST elevated myocardial infarction) ([place])   . Parkinson's disease ([place])   . S/P CABG (coronary artery bypass graft)   . Transient ischemic attack    hx of  . Trochanteric bursitis of right hip    Past Surgical History:  Procedure Laterality Date  . CARDIAC CATHETERIZATION  5/12,1/13   4  stents placed  . COLON SURGERY    . CORONARY ARTERY BYPASS GRAFT    . KNEE ARTHROSCOPY  10/11/2011   Procedure: ARTHROSCOPY KNEE;  Surgeon: Lorn Junes, MD;  Location: [place];  Service: Orthopedics;  Laterality: Left;  Left Knee Arthroscopy with Medial and Lateral Partial Menisectomy, Chondroplasty  . LEFT HEART CATHETERIZATION WITH CORONARY ANGIOGRAM N/A 08/25/2011   Procedure: LEFT HEART CATHETERIZATION WITH CORONARY ANGIOGRAM;  Surgeon: Burnell Blanks, MD;  Location: [facility] CATH LAB;  Service: Cardiovascular;  Laterality: N/A;  . LITHOTRIPSY    . STERIOD INJECTION  10/11/2011   Procedure: STEROID INJECTION;  Surgeon: Lorn Junes, MD;  Location: [facility];  Service: Orthopedics;  Laterality: Right;  Steroid Injection Second Toe  . TRANSURETHRAL RESECTION OF PROSTATE    . URETHRAL DILATION     Family History  Problem Relation Age of Onset  . Heart disease Mother   . Heart disease Father   . Hyperlipidemia Father   . Stroke Father   . Hyperlipidemia Brother   . Heart disease Brother   . Coronary artery disease Other        family hx of male 1st degree relative ,64  . Hyperlipidemia Other        family hx of  . Hypertension Other  family hx of  . Arthritis Other        family hx of  . Healthy Daughter   . Dementia Neg Hx    Allergies  Allergen Reactions  . Testosterone Other (See Comments)    ABDOMINAL PAIN and cramping  . Fluoxetine Other (See Comments)    Caused depression and aggression  . Requip [Ropinirole] Nausea Only    Current Outpatient Medications:  .  aspirin 81 MG tablet, Take 81 mg by mouth daily. , Disp: , Rfl:  .  Carbidopa-Levodopa ER (SINEMET CR) 25-100 MG tablet controlled release, Take 1 tablet by mouth 3 (three) times daily., Disp: 270 tablet, Rfl: 1 .  clopidogrel (PLAVIX) 75 MG tablet, TAKE 1 TABLET BY MOUTH ONCE DAILY., Disp: 90 tablet, Rfl: 0 .  esomeprazole (NEXIUM) 40 MG capsule, Take 1 capsule by  mouth daily., Disp: , Rfl:  .  fluocinonide-emollient (LIDEX-E) 0.05 % cream, Apply 1 application topically 2 (two) times daily., Disp: 60 g, Rfl: 2 .  furosemide (LASIX) 40 MG tablet, Take 1 tablet (40 mg total) by mouth daily., Disp: 90 tablet, Rfl: 1 .  gabapentin (NEURONTIN) 100 MG capsule, Take 1 capsule (100 mg total) by mouth at bedtime., Disp: 60 capsule, Rfl: 3 .  metoprolol tartrate (LOPRESSOR) 25 MG tablet, Take 0.5 tablets (12.5 mg total) by mouth 2 (two) times daily., Disp: 90 tablet, Rfl: 1 .  Multiple Vitamin (MULTI-VITAMINS) TABS, Take 1 tablet by mouth daily. , Disp: , Rfl:  .  pravastatin (PRAVACHOL) 20 MG tablet, TAKE 1 TABLET BY MOUTH EVERY EVENING., Disp: 30 tablet, Rfl: 0 .  sacubitril-valsartan (ENTRESTO) 24-26 MG, Take 1 tablet by mouth 2 (two) times daily., Disp: 60 tablet, Rfl: 2 .  tamsulosin (FLOMAX) 0.4 MG CAPS capsule, Take 0.4 mg by mouth daily as needed (to relax the prostate). , Disp: , Rfl: 1 .  TRINTELLIX 5 MG TABS tablet, Take 1 tablet (5 mg total) by mouth daily., Disp: 30 tablet, Rfl: 6 .  ergocalciferol (VITAMIN D2) 1.25 MG (50000 UT) capsule, Take 1 capsule (50,000 Units total) by mouth once a week. (Patient not taking: Reported on 05/06/2019), Disp: 12 capsule, Rfl: 0 .  nitroGLYCERIN (NITROSTAT) 0.4 MG SL tablet, Place 1 tablet (0.4 mg total) under the tongue every 5 (five) minutes as needed. Chest pain (Patient not taking: Reported on 05/06/2019), Disp: 25 tablet, Rfl: 11 .  pantoprazole (PROTONIX) 40 MG tablet, Take 1 tablet (40 mg total) by mouth daily., Disp: 30 tablet, Rfl: 3  ROS Cardiovascular: Denies chest pain  Respiratory: +cough   OBJECTIVE: BP 108/64 (BP Location: Left Arm, Patient Position: Sitting, Cuff Size: Normal)   Pulse 85   Temp 97.8 F (36.6 C) (Temporal)   Ht 5\' 6"  (1.676 m)   Wt 210 lb 8 oz (95.5 kg)   SpO2 96%   BMI 33.98 kg/m   Constitutional: -  VS reviewed -  Well developed, well nourished, appears stated age -  No  apparent distress  Psychiatric: -  Oriented to person, place, and time -  Memory intact -  Affect and mood normal -  Fluent conversation, good eye contact -  Judgment and insight age appropriate  Eye: -  +scleral injection on L, no discharge -  Pupils symmetric, round, reactive to light  ENMT: -  MMM    Pharynx moist, no exudate, no erythema  Neck: -  No gross swelling, no palpable masses -  Thyroid midline, not enlarged, mobile, no palpable masses  Cardiovascular: -  RRR -  No LE edema  Respiratory: -  Normal respiratory effort, no accessory muscle use, no retraction -  Breath sounds equal, no wheezes, no ronchi, no crackles  Gastrointestinal: -  Bowel sounds normal -  No tenderness, no distention, no guarding, no masses  Skin: -  No significant lesion on inspection -  Warm and dry to palpation   ASSESSMENT/PLAN: Chronic cough - Plan: pantoprazole (PROTONIX) 40 MG tablet  Vitamin D deficiency - Plan: Vitamin D (25 hydroxy)  Eye redness - Plan: Ambulatory referral to Ophthalmology  Need for influenza vaccination - Plan: Flu Vaccine QUAD High Dose(Fluad)  Suspect GERD for etiology of chronic cough.  Will change Nexium to pantoprazole.  Reflux precautions discussed. Check vitamin D, if still low will send in supplement versus recommending over-the-counter supplement. Refer to ophthalmology per his request for second opinion. Patient should return 1 mo to recheck cough. The patient voiced understanding and agreement to the plan.   [place], DO 05/06/19  4:06 PM

## 2019-05-07 ENCOUNTER — Encounter: Payer: Self-pay | Admitting: Family Medicine

## 2019-05-07 ENCOUNTER — Ambulatory Visit (INDEPENDENT_AMBULATORY_CARE_PROVIDER_SITE_OTHER): Payer: Medicare Other | Admitting: Family Medicine

## 2019-05-07 VITALS — BP 118/68 | HR 91 | Temp 98.2°F | Ht 66.0 in | Wt 211.5 lb

## 2019-05-07 DIAGNOSIS — M79661 Pain in right lower leg: Secondary | ICD-10-CM | POA: Diagnosis not present

## 2019-05-07 DIAGNOSIS — M79662 Pain in left lower leg: Secondary | ICD-10-CM

## 2019-05-07 MED ORDER — PREDNISONE 20 MG PO TABS: 40.0000 mg | ORAL_TABLET | Freq: Every day | ORAL | 0 refills | Status: AC

## 2019-05-07 NOTE — Progress Notes (Signed)
Musculoskeletal Exam  Patient: Shawn Meza DOB: 1946-06-27  DOS: 05/07/2019  SUBJECTIVE:  Chief Complaint:   Chief Complaint  Patient presents with  . Knee Pain    Shawn Meza is a 73 y.o.  male for evaluation and treatment of b/l knee pain.   Onset:  4 months ago. No inj or change in activity.  Location: anterior R knee Character:  aching  Progression of issue:  has worsened Associated symptoms: no swelling, unsteady gait Treatment: to date has been various topicals with some help, braces which do not help.   Neurovascular symptoms: no  ROS: Musculoskeletal/Extremities: +b/l knee pain  Past Medical History:  Diagnosis Date  . Arthritis   . BPH (benign prostatic hypertrophy)   . Chronic coronary artery disease   . Colon cancer ([place])   . Elevated PSA   . Erectile dysfunction   . Essential hypertension   . Essential tremor   . GERD (gastroesophageal reflux disease)   . Headache(784.0)   . Hiatal hernia   . Hypercholesterolemia   . Long-term use of aspirin therapy   . Major depression, chronic   . Medial meniscus tear 10/11/2011  . Metabolic syndrome   . Morbid obesity ([place])   . Nephrolithiasis    hx of  . NSTEMI (non-ST elevated myocardial infarction) ([place])   . Parkinson's disease ([place])   . S/P CABG (coronary artery bypass graft)   . Transient ischemic attack    hx of  . Trochanteric bursitis of right hip     Objective: VITAL SIGNS: BP 118/68 (BP Location: Left Arm, Patient Position: Sitting, Cuff Size: Normal)   Pulse 91   Temp 98.2 F (36.8 C) (Temporal)   Ht 5\' 6"  (1.676 m)   Wt 211 lb 8 oz (95.9 kg)   SpO2 94%   BMI 34.14 kg/m  Constitutional: Well formed, well developed. No acute distress. Cardiovascular: Brisk cap refill Thorax & Lungs: No accessory muscle use Musculoskeletal: Knee.   Normal active range of motion: yes.   Normal passive range of motion: yes Tenderness to palpation: ttp over medial prox tibia b/l, ttp over b/l  calves Deformity: no Ecchymosis: no Tests positive: none Tests negative: Homan's, Lachman's, varus/valgus, pat app/grind, Stine's No edema Neurologic: Normal sensory function. No focal deficits noted. Psychiatric: Normal mood. Age appropriate judgment and insight. Alert & oriented x 3.    Assessment:  Bilateral calf pain - Plan: predniSONE (DELTASONE) 20 MG tablet  Plan: Tylenol, heat, ice, stretches/exercises.  F/u in 1 mo. The patient voiced understanding and agreement to the plan.   [place], DO 05/07/19  4:39 PM

## 2019-05-07 NOTE — Patient Instructions (Signed)
Ice/cold pack over area for 10-15 min twice daily.  Heat (pad or rice pillow in microwave) over affected area, 10-15 minutes twice daily.   OK to take Tylenol 1000 mg (2 extra strength tabs) or 975 mg (3 regular strength tabs) every 6 hours as needed.  Stretching and range of motion exercises These exercises warm up your muscles and joints and improve the movement and flexibility of your lower leg. These exercises also help to relieve pain and stiffness.  Exercise A: Gastrocnemius stretch 1. Sit with your left / right leg extended. 2. Loop a belt or towel around the ball of your left / right foot. The ball of your foot is on the walking surface, right under your toes. 3. Hold both ends of the belt or towel. 4. Keep your left / right ankle and foot relaxed and keep your knee straight while you use the belt or towel to pull your foot and ankle toward you. Stop at the first point of resistance. 5. Hold this position for 30 seconds. Repeat 2 times. Complete this exercise 3 times per week.  Exercise B: Ankle alphabet 1. Sit with your left / right leg supported at the lower leg. ? Do not rest your foot on anything. ? Make sure your foot has room to move freely. 2. Think of your left / right foot as a paintbrush, and move your foot to trace each letter of the alphabet in the air. Keep your hip and knee still while you trace. 3. Trace every letter from A to Z. Repeat 2 times. Complete this exercise 3 times per week.  Strengthening exercises These exercises build strength and endurance in your lower leg. Endurance is the ability to use your muscles for a long time, even after they get tired.  Exercise C: Plantar flexors with band 1. Sit with your left / right leg extended. 2. Loop a rubber exercise band or tube around the ball of your left / right foot. The ball of your foot is on the walking surface, right under your toes. 3. While holding both ends of the band or tube, slowly point your toes  downward, pushing them away from you. 4. Hold this position for 3 seconds. 5. Slowly return your foot to the starting position and repeat for a total of 10 repetitions. Repeat 2 times. Complete this exercise 3 times per week.  Exercise D: Plantar flexors, standing 1. Stand with your feet shoulder-width apart. 2. Place your hands on a wall or table to steady yourself as needed, but try not to use it very much for support. 3. Rise up on your toes. 4. If this exercise is too easy, try these options: ? Shift your weight toward your left / right leg until you feel challenged. ? If told by your health care provider, stand on your left / right foot only. 5. Hold this position for 3 seconds. 6. Repeat for a total of 10 repetitions. Repeat 2 times. Complete this exercise 3 times per week.  Exercise E: Plantar flexors, eccentric 1. Stand on the balls of your feet on the edge of a step. The ball of your foot is on the walking surface, right under your toes. 2. Place your hands on a wall or railing for balance as needed, but try not to lean on it for support. 3. Rise up on your toes, using both legs to help. 4. Slowly shift all of your weight to your left / right foot and lift your other  foot off the step. 5. Slowly lower your left / right heel so it drops below the level of the step. You will feel a slight stretch in your left / right calf. 6. Put your other foot back onto the step. Repeat 2 times. Complete this exercise 3 times per week. This information is not intended to replace advice given to you by your health care provider. Make sure you discuss any questions you have with your health care provider.

## 2019-05-16 ENCOUNTER — Ambulatory Visit
Admission: RE | Admit: 2019-05-16 | Discharge: 2019-05-16 | Disposition: A | Discharge status: Home / Self Care | Payer: Medicare Other | Source: Ambulatory Visit | Attending: Neurology | Admitting: Neurology

## 2019-05-16 ENCOUNTER — Other Ambulatory Visit: Payer: Self-pay

## 2019-05-16 DIAGNOSIS — M545 Low back pain, unspecified: Secondary | ICD-10-CM

## 2019-05-21 ENCOUNTER — Telehealth: Payer: Self-pay | Admitting: Neurology

## 2019-05-21 DIAGNOSIS — M48061 Spinal stenosis, lumbar region without neurogenic claudication: Secondary | ICD-10-CM | POA: Diagnosis not present

## 2019-05-21 NOTE — Telephone Encounter (Signed)
Call placed to [facility] Imaging/MRI dept as scan done 10/15 635 pm and states in computer results not yet final.  They transferred me to med records who stated I needed to talk to radiology as they will be able to give me a more specific date. Spoke to radiology dept - she said the images are done the tech just needs to hit "complete" so radiologist can read it. Informed her MD would like to have results as soon as they are ready. She is calling now to make sure in process -did not have a specific date will be done but she is aware it is needed to be done as soon as possible.

## 2019-05-21 NOTE — Telephone Encounter (Signed)
Please let pt know that I got back results of MRI lumbar spine.  He has fairly advanced spinal stenosis.  If agreeable, refer to Dr. Vertell Limber for an opinion regarding this

## 2019-05-21 NOTE — Telephone Encounter (Signed)
Could you call radiology and ask about pts MRI Lumbar spine done 10/15?  There are films but it doesn't look like a report was ever generated by the radiologist.  When can we expect that will be completed?  Thanks.

## 2019-05-22 NOTE — Telephone Encounter (Signed)
Patient notified of MRI and results and referral with insurance sent to Dr.Stern's office for an appt. Patient understood and agreed to go.

## 2019-05-26 NOTE — Progress Notes (Signed)
Cardiology Office Note:    Date:  05/28/2019   ID:  Shawn Meza, DOB 06-27-1946, MRN [id number]  PCP:  [name], DO  Cardiologist:  Shirlee More, MD    Referring MD: Scot Jun, FNP    ASSESSMENT:    1. Ischemic cardiomyopathy   2. Chronic systolic (congestive) heart failure ([place])   3. Hypertensive heart disease with heart failure ([place])   4. Coronary artery disease involving native coronary artery of native heart with angina pectoris ([place])   5. Mixed hyperlipidemia   6. Statin intolerance    PLAN:    In order of problems listed above:  1. Improved on guideline directed therapy including beta-blocker Entresto and loop diuretic.  He is not on SGLT2 inhibitor or MRA with orthostatic hypotension.  He has declined ICD consideration because of his Parkinson's. 2. His heart failure is improved he has no fluid overload no shortness of breath continue his current loop diuretic guideline directed treatment check BMP and proBNP level.  Unfortunately cannot uptitrate with symptomatic orthostatic hypotension 3. Stable BP at target without lightheadedness continue current guideline directed therapy 4. Stable CAD having no angina after revascularization continue dual antiplatelet beta-blocker and low intensity statin with poor tolerance due to muscle weakness from Parkinson's 5. Continue his low intensity statin as tolerated this time I would not add additional drugs with his neuromuscular disease   Next appointment: 3 months   Medication Adjustments/Labs and Tests Ordered: Current medicines are reviewed at length with the patient today.  Concerns regarding medicines are outlined above.  No orders of the defined types were placed in this encounter.  No orders of the defined types were placed in this encounter.   Chief Complaint  Patient presents with  . Follow-up  . Cardiomyopathy  . Coronary Artery Disease    History of Present Illness:    Shawn Meza  is a 73 y.o. male with a hx of CAD with MI 10/17/17 s/p CABG 10/23/17, cardiomyopathy, heart failure, hypertensive heart disease, HLD. He has idiopathic Parkinson's disease and follows with Dr. Carles Collet.  Echocardiogram 06/04/2018 showed hypokinesia of the anterior anterior septal regions distribution left anterior descending coronary artery ejection fraction 35 to 40% and mild aortic regurgitation.  A Myoview study was performed 07/31/2018 with ejection fraction of 23% with global hypokinesia and extensive fixed defect and no ischemia. Severely elevated ProBNP 05/17/18 of 756 which improved to 490 on 09/26/18.  He was last seen 02/20/2019 with symptomatic hypotension.He is poorly statin intolerant with muscle weakness. Compliance with diet, lifestyle and medications: Yes   He is doing better no further orthostasis on reduced doses of Entresto he is not short of breath he has no edema chest pain palpitation or syncope but is increasingly bothered with back and lower extremity knee pain.  He is initiating physical therapy. Past Medical History:  Diagnosis Date  . Arthritis   . BPH (benign prostatic hypertrophy)   . Chronic coronary artery disease   . Colon cancer (Shawn Meza)   . Elevated PSA   . Erectile dysfunction   . Essential hypertension   . Essential tremor   . GERD (gastroesophageal reflux disease)   . Headache(784.0)   . Hiatal hernia   . Hypercholesterolemia   . Long-term use of aspirin therapy   . Major depression, chronic   . Medial meniscus tear 10/11/2011  . Metabolic syndrome   . Morbid obesity (Shawn Meza)   . Nephrolithiasis    hx of  . NSTEMI (  non-ST elevated myocardial infarction) (Shawn Meza)   . Parkinson's disease (Shawn Meza)   . S/P CABG (coronary artery bypass graft)   . Transient ischemic attack    hx of  . Trochanteric bursitis of right hip     Past Surgical History:  Procedure Laterality Date  . CARDIAC CATHETERIZATION  5/12,1/13   4 stents placed  . COLON SURGERY    . CORONARY ARTERY  BYPASS GRAFT    . KNEE ARTHROSCOPY  10/11/2011   Procedure: ARTHROSCOPY KNEE;  Surgeon: Lorn Junes, MD;  Location: [place];  Service: Orthopedics;  Laterality: Left;  Left Knee Arthroscopy with Medial and Lateral Partial Menisectomy, Chondroplasty  . LEFT HEART CATHETERIZATION WITH CORONARY ANGIOGRAM N/A 08/25/2011   Procedure: LEFT HEART CATHETERIZATION WITH CORONARY ANGIOGRAM;  Surgeon: Burnell Blanks, MD;  Location: [facility] C[facility] LAB;  Service: Cardiovascular;  Laterality: N/A;  . LITHOTRIPSY    . STERIOD INJECTION  10/11/2011   Procedure: STEROID INJECTION;  Surgeon: Lorn Junes, MD;  Location: [place];  Service: Orthopedics;  Laterality: Right;  Steroid Injection Second Toe  . TRANSURETHRAL RESECTION OF PROSTATE    . URETHRAL DILATION      Current Medications: Current Meds  Medication Sig  . aspirin 81 MG tablet Take 81 mg by mouth daily.   . Carbidopa-Levodopa ER (SINEMET CR) 25-100 MG tablet controlled release Take 1 tablet by mouth 3 (three) times daily.  . clopidogrel (PLAVIX) 75 MG tablet TAKE 1 TABLET BY MOUTH ONCE DAILY.  . ergocalciferol (VITAMIN D2) 1.25 MG (50000 UT) capsule Take 1 capsule (50,000 Units total) by mouth once a week.  . esomeprazole (NEXIUM) 40 MG capsule Take 1 capsule by mouth daily.  . fluocinonide-emollient (LIDEX-E) 0.05 % cream Apply 1 application topically 2 (two) times daily.  . furosemide (LASIX) 40 MG tablet Take 1 tablet (40 mg total) by mouth daily.  Marland Kitchen gabapentin (NEURONTIN) 100 MG capsule Take 1 capsule (100 mg total) by mouth at bedtime.  . metoprolol tartrate (LOPRESSOR) 25 MG tablet Take 0.5 tablets (12.5 mg total) by mouth 2 (two) times daily.  . Multiple Vitamin (MULTI-VITAMINS) TABS Take 1 tablet by mouth daily.   . nitroGLYCERIN (NITROSTAT) 0.4 MG SL tablet Place 1 tablet (0.4 mg total) under the tongue every 5 (five) minutes as needed. Chest pain  . pravastatin (PRAVACHOL) 20 MG tablet TAKE 1  TABLET BY MOUTH EVERY EVENING.  . sacubitril-valsartan (ENTRESTO) 24-26 MG Take 1 tablet by mouth 2 (two) times daily.  . tamsulosin (FLOMAX) 0.4 MG CAPS capsule Take 0.4 mg by mouth daily as needed (to relax the prostate).   . TRINTELLIX 5 MG TABS tablet Take 1 tablet (5 mg total) by mouth daily.     Allergies:   Testosterone, Fluoxetine, and Requip [ropinirole]   Social History   Socioeconomic History  . Marital status: Divorced    Spouse name: Not on file  . Number of children: 1  . Years of education: 9  . Highest education level: High school graduate  Occupational History  . 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 Financial resource strain: Not on file  . Food insecurity    Worry: Not on file    Inability: Not on file  . Transportation needs    Medical: Not on file    Non-medical: Not on file  Tobacco Use  . Smoking status: Never Smoker  . Smokeless tobacco: Never Used  Substance and Sexual  Activity  . Alcohol use: No  . Drug use: No  . Sexual activity: Not Currently  Lifestyle  . Physical activity    Days per week: Not on file    Minutes per session: Not on file  . Stress: Not on file  Relationships  . Social Herbalist on phone: Not on file    Gets together: Not on file    Attends religious service: Not on file    Active member of club or organization: Not on file    Attends meetings of clubs or organizations: Not on file    Relationship status: Not on file  Other Topics Concern  . Not on file  Social History Narrative   Divorced 2016   Married.   Caffeine use: none         Family History: The patient's family history includes Arthritis in an other family member; Coronary artery disease in an other family member; Healthy in his daughter; Heart disease in his brother, father, and mother; Hyperlipidemia in his brother, father, and another family member; Hypertension in an other family member; Stroke in his father.  There is no history of Dementia. ROS:   Please see the history of present illness.    All other systems reviewed and are negative.  EKGs/Labs/Other Studies Reviewed:    The following studies were reviewed today:  Recent Labs: 12/19/2018: TSH 2.010 02/20/2019: ALT 21; BUN 16; Creatinine, Ser 1.08; NT-Pro BNP 272; Potassium 4.1; Sodium 140  Recent Lipid Panel    Component Value Date/Time   CHOL 189 02/20/2019 1342   TRIG 180 (H) 02/20/2019 1342   TRIG 107 05/09/2010   HDL 39 (L) 02/20/2019 1342   CHOLHDL 4.8 02/20/2019 1342   CHOLHDL 3.6 05/20/2015 1410   VLDL 27 05/20/2015 1410   LDLCALC 114 (H) 02/20/2019 1342    Physical Exam:    VS:  BP (!) 134/58   Pulse 68   Ht 5\' 6"  (1.676 m)   Wt 211 lb (95.7 kg)   SpO2 97%   BMI 34.06 kg/m     Wt Readings from Last 3 Encounters:  05/28/19 211 lb (95.7 kg)  05/07/19 211 lb 8 oz (95.9 kg)  05/06/19 210 lb 8 oz (95.5 kg)     GEN: Typical parkinsonian features masked facies and resting tremor well nourished, well developed in no acute distress HEENT: Normal NECK: No JVD; No carotid bruits LYMPHATICS: No lymphadenopathy CARDIAC: RRR, no murmurs, rubs, gallops RESPIRATORY:  Clear to auscultation without rales, wheezing or rhonchi  ABDOMEN: Soft, non-tender, non-distended MUSCULOSKELETAL:  No edema; No deformity  SKIN: Warm and dry NEUROLOGIC:  Alert and oriented x 3 PSYCHIATRIC:  Normal affect    Signed, Shirlee More, MD  05/28/2019 1:38 PM    [facility]

## 2019-05-28 ENCOUNTER — Ambulatory Visit (INDEPENDENT_AMBULATORY_CARE_PROVIDER_SITE_OTHER): Payer: Medicare Other | Admitting: Cardiology

## 2019-05-28 ENCOUNTER — Encounter: Payer: Self-pay | Admitting: Cardiology

## 2019-05-28 ENCOUNTER — Other Ambulatory Visit: Payer: Self-pay

## 2019-05-28 VITALS — BP 134/58 | HR 68 | Ht 66.0 in | Wt 211.0 lb

## 2019-05-28 DIAGNOSIS — I5022 Chronic systolic (congestive) heart failure: Secondary | ICD-10-CM | POA: Diagnosis not present

## 2019-05-28 DIAGNOSIS — I255 Ischemic cardiomyopathy: Secondary | ICD-10-CM

## 2019-05-28 DIAGNOSIS — I110 Hypertensive heart disease with heart failure: Secondary | ICD-10-CM | POA: Diagnosis not present

## 2019-05-28 DIAGNOSIS — Z789 Other specified health status: Secondary | ICD-10-CM

## 2019-05-28 DIAGNOSIS — E782 Mixed hyperlipidemia: Secondary | ICD-10-CM | POA: Diagnosis not present

## 2019-05-28 DIAGNOSIS — I25119 Atherosclerotic heart disease of native coronary artery with unspecified angina pectoris: Secondary | ICD-10-CM | POA: Diagnosis not present

## 2019-05-28 NOTE — Patient Instructions (Signed)
Medication Instructions:  Your physician recommends that you continue on your current medications as directed. Please refer to the Current Medication list given to you today.  *If you need a refill on your cardiac medications before your next appointment, please call your pharmacy*  Lab Work: Your physician recommends that you return for lab work today: BMP, [place].   If you have labs (blood work) drawn today and your tests are completely normal, you will receive your results only by: Marland Kitchen MyChart Message (if you have MyChart) OR . A paper copy in the mail If you have any lab test that is abnormal or we need to change your treatment, we will call you to review the results.  Testing/Procedures: None  Follow-Up: At [facility], you and your health needs are our priority.  As part of our continuing mission to provide you with exceptional heart care, we have created designated Provider Care Teams.  These Care Teams include your primary Cardiologist (physician) and Advanced Practice Providers (APPs -  Physician Assistants and Nurse Practitioners) who all work together to provide you with the care you need, when you need it.  Your next appointment:   3 months  The format for your next appointment:   In Person  Provider:   Shirlee More, MD

## 2019-05-29 LAB — BASIC METABOLIC PANEL
BUN/Creatinine Ratio: 15 (ref 10–24)
BUN: 15 mg/dL (ref 8–27)
CO2: 23 mmol/L (ref 20–29)
Calcium: 9.3 mg/dL (ref 8.6–10.2)
Chloride: 104 mmol/L (ref 96–106)
Creatinine, Ser: 0.98 mg/dL (ref 0.76–1.27)
GFR calc Af Amer: 87 mL/min/{1.73_m2} (ref >59)
GFR calc non Af Amer: 75 mL/min/{1.73_m2} (ref >59)
Glucose: 95 mg/dL (ref 65–99)
Potassium: 4.4 mmol/L (ref 3.5–5.2)
Sodium: 141 mmol/L (ref 134–144)

## 2019-05-29 LAB — PRO B NATRIURETIC PEPTIDE: NT-Pro BNP: 421 pg/mL (ref 0–486)

## 2019-05-31 ENCOUNTER — Ambulatory Visit: Payer: Medicare Other | Attending: Neurology | Admitting: Physical Therapy

## 2019-05-31 ENCOUNTER — Encounter: Payer: Self-pay | Admitting: Physical Therapy

## 2019-05-31 ENCOUNTER — Other Ambulatory Visit: Payer: Self-pay

## 2019-05-31 DIAGNOSIS — M6281 Muscle weakness (generalized): Secondary | ICD-10-CM | POA: Diagnosis not present

## 2019-05-31 DIAGNOSIS — R2689 Other abnormalities of gait and mobility: Secondary | ICD-10-CM | POA: Insufficient documentation

## 2019-05-31 DIAGNOSIS — R2681 Unsteadiness on feet: Secondary | ICD-10-CM

## 2019-05-31 NOTE — Therapy (Signed)
[facility] [address] [place], [place], [address] Phone: [phone]   Fax:  [phone]  Physical Therapy Evaluation  Patient Details  Name: Shawn Meza MRN: [id number] Date of Birth: Feb 13, 1946 Referring Provider (PT): [name], Josephina Shih Date: 05/31/2019  PT End of Session - 05/31/19 1432    Visit Number  1    Number of Visits  9    Date for PT Re-Evaluation  07/30/19    Authorization Type  UHC Medicare/Medicaid    PT Start Time  0935    PT Stop Time  1014    PT Time Calculation (min)  39 min    Activity Tolerance  Patient tolerated treatment well    Behavior During Therapy  [facility] for tasks assessed/performed       Past Medical History:  Diagnosis Date  . Arthritis   . BPH (benign prostatic hypertrophy)   . Chronic coronary artery disease   . Colon cancer ([place])   . Elevated PSA   . Erectile dysfunction   . Essential hypertension   . Essential tremor   . GERD (gastroesophageal reflux disease)   . Headache(784.0)   . Hiatal hernia   . Hypercholesterolemia   . Long-term use of aspirin therapy   . Major depression, chronic   . Medial meniscus tear 10/11/2011  . Metabolic syndrome   . Morbid obesity ([place])   . Nephrolithiasis    hx of  . NSTEMI (non-ST elevated myocardial infarction) ([place])   . Parkinson's disease ([place])   . S/P CABG (coronary artery bypass graft)   . Transient ischemic attack    hx of  . Trochanteric bursitis of right hip     Past Surgical History:  Procedure Laterality Date  . CARDIAC CATHETERIZATION  5/12,1/13   4 stents placed  . COLON SURGERY    . CORONARY ARTERY BYPASS GRAFT    . KNEE ARTHROSCOPY  10/11/2011   Procedure: ARTHROSCOPY KNEE;  Surgeon: Lorn Junes, MD;  Location: [place];  Service: Orthopedics;  Laterality: Left;  Left Knee Arthroscopy with Medial and Lateral Partial Menisectomy, Chondroplasty  . LEFT HEART CATHETERIZATION WITH  CORONARY ANGIOGRAM N/A 08/25/2011   Procedure: LEFT HEART CATHETERIZATION WITH CORONARY ANGIOGRAM;  Surgeon: Burnell Blanks, MD;  [facility] CATH LAB;  Service: Cardiovascular;  Laterality: N/A;  . LITHOTRIPSY    . STERIOD INJECTION  10/11/2011   Procedure: STEROID INJECTION;  Surgeon: Lorn Junes, MD;  Location: [facility];  Service: Orthopedics;  Laterality: Right;  Steroid Injection Second Toe  . TRANSURETHRAL RESECTION OF PROSTATE    . URETHRAL DILATION      There were no vitals filed for this visit.   Subjective Assessment - 05/31/19 0939    Subjective  Pt reports "biggest trouble is right across my back".  Whenever I do anything, it tightens up and gives me trouble.  My knees bother me some and they get pretty sore with walking (MD doesn't think it's arthritis, but it's the way I'm walking).  Feel like my balance is pretty fair.    Pertinent History  hospitalized 2019 s/p CABG x 5, ischemic cardiomyopathy,CHF, Parkinson's disease x 4 years    Patient Stated Goals  Pt's goal for therapy is to be able to do things without pain and being worn out.    Currently in Pain?  Yes    Pain Score  6     Pain Location  Shoulder  Pain Orientation  Right    Pain Descriptors / Indicators  Aching;Sore    Pain Type  Acute pain    Pain Onset  In the past 7 days    Pain Frequency  Intermittent    Aggravating Factors   trying to lift water buckets, raising arm    Pain Relieving Factors  ointment, ice, heat    Effect of Pain on Daily Activities  Pain in shoulder, back, knees will be monitored during course of therapy; will not be specifically addressed as a goal    Multiple Pain Sites  Yes    Pain Score  10   At worst   Pain Location  Back    Pain Orientation  Right;Left;Lower    Pain Descriptors / Indicators  Tightness    Pain Type  Chronic pain    Pain Onset  More than a month ago    Pain Frequency  Intermittent    Aggravating Factors   sitting, resting, stopping  what I'm doing    Pain Relieving Factors  standing, twisting    Pain Score  4    Pain Location  Knee    Pain Orientation  Right;Left;Medial    Pain Descriptors / Indicators  Aching    Pain Type  Chronic pain    Pain Onset  More than a month ago    Pain Frequency  Intermittent    Aggravating Factors   walking    Pain Relieving Factors  sitting         [facility] PT Assessment - 05/31/19 0954      Assessment   Medical Diagnosis  Parkinson's disease    Referring Provider (PT)  [name], Wells Guiles    Onset Date/Surgical Date  04/30/19    Hand Dominance  Right      Precautions   Precautions  Fall      Balance Screen   Has the patient fallen in the past 6 months  No    Has the patient had a decrease in activity level because of a fear of falling?   No    Is the patient reluctant to leave their home because of a fear of falling?   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   Leisure  Raises puppies (toy [place]), [place], [place], all household and yard activities      Observation/Other Assessments   Focus on Therapeutic Outcomes (FOTO)   NA      Posture/Postural Control   Posture/Postural Control  Postural limitations    Postural Limitations  Forward head;Rounded Shoulders    Posture Comments  L shoulder lower than R      ROM / Strength   AROM / PROM / Strength  Strength      Strength   Overall Strength  Deficits    Overall Strength Comments  Grossly tested at least 5/5 thorughout, except L hamstrings 4/5, L dorsiflexion 4/5      Transfers   Transfers  Sit to Stand;Stand to Sit    Sit to Stand  6: Modified  independent (Device/Increase time);Without upper extremity assist;From  chair/3-in-1    Five time sit to stand comments   13.53   slight posterior lean, c/o increase back pain   Stand to Sit  6: Modified independent (Device/Increase time);Without upper extremity assist;To chair/3-in-1      Ambulation/Gait   Ambulation/Gait  Yes    Ambulation/Gait Assistance  6: Modified independent (Device/Increase time)    Ambulation Distance (Feet)  115 Feet    Assistive device  None    Gait Pattern  Step-through pattern;Decreased arm swing - left;Decreased step length - left;Decreased stance time - left;Decreased trunk rotation;Poor foot clearance - left;Decreased dorsiflexion - left    Ambulation Surface  Level;Indoor    Gait velocity  12.59 sec = 2.61 ft/sec    Gait Comments  Pt reports increase in L low back pain after transfers and gait.      Standardized Balance Assessment   Standardized Balance Assessment  Timed Up and Go Test      Timed Up and Go Test   Normal TUG (seconds)  12.63    Manual TUG (seconds)  12.72    Cognitive TUG (seconds)  13.72    TUG Comments  Scores >13.5-15 seconds indicate increased fall risk                Objective measurements completed on examination: See above findings.                   PT Long Term Goals - 05/31/19 1442      PT LONG TERM GOAL #1   Title  Pt will be independent with HEP for improved transfers, gait, and trunk flexibility/decreased pain.  TARGET 06/28/2019    Time  4    Period  Weeks    Status  New    Target Date  06/28/19      PT LONG TERM GOAL #2   Title  Pt will perform 5x sit<>stand to less than or equal to 12.5 seconds for improved functional lower extremity strength.    Time  4    Period  Weeks    Status  New    Target Date  06/28/19      PT LONG TERM GOAL #3   Title  6MWT to be performed, with goal to be written as appropriate.    Time  4    Period  Weeks    Status  New    Target Date  06/28/19      PT LONG TERM GOAL #4   Title  DGI score to be assessed with goal to be  written as appropriate.    Time  4    Period  Weeks    Status  New    Target Date  06/28/19      PT LONG TERM GOAL #5   Title  Pt will verbalize understanding of local Parkinson's disease resources.    Time  4    Period  Weeks    Status  New    Target Date  06/28/19             Plan - 05/31/19 1434    Clinical Impression Statement  Pt is a 73 year old male who presents to OPPT with history of Parkinson's disease, who presents with decreased trunk flexibility, decreased timing and coordination of gait, lumbar pain with standing and gait, abnormal posture, decreased functional strength.  He lives alone and performs yardwork and raises and he wants to maintain  independence as long as possible.  He does not currently participate in HEP.  He would benefit from skilled PT to address the above stated deficits to improve overall functional mobility.    Personal Factors and Comorbidities  Comorbidity 3+    Comorbidities  See medical history in subjective (cardiac history)    Examination-Activity Limitations  Stand;Locomotion Level;Transfers    Examination-Participation Restrictions  Yard Work   Acitivites associated with raises puppies   Stability/Clinical Decision Making  Evolving/Moderate complexity    Clinical Decision Making  Moderate    Rehab Potential  Good    PT Frequency  2x / week    PT Duration  4 weeks   plus eval   PT Treatment/Interventions  ADLs/Self Care Home Management;Gait training;Functional mobility training;Therapeutic activities;Therapeutic exercise;Balance training;DME Instruction;Neuromuscular re-education;Patient/family education    PT Next Visit Plan  Please do 3MWT or 6MWT, DGI; Initiate HEP-PWR! Moves sitting, standing to incorporate trunk flexibility, coordinated UE movements; gait with arm swing, incr. L step length    Consulted and Agree with Plan of Care  Patient       Patient will benefit from skilled therapeutic intervention in order to improve the  following deficits and impairments:  Abnormal gait, Decreased coordination, Difficulty walking, Decreased strength, Decreased mobility, Postural dysfunction, Pain, Decreased balance  Visit Diagnosis: Other abnormalities of gait and mobility  Unsteadiness on feet  Muscle weakness (generalized)     Problem List Patient Active Problem List   Diagnosis Date Noted  . Fatigue 01/15/2019  . Chronic systolic (congestive) heart failure ([place]) 09/18/2018  . Ischemic cardiomyopathy 09/16/2018  . Aortic regurgitation 09/16/2018  . Cardiomyopathy, unspecified ([place]) 06/13/2018  . Abscess of right axilla 12/12/2017  . BMI 33.0-33.9,adult 12/12/2017  . 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  . Acute blood loss anemia 10/25/2017  . Acute postoperative respiratory insufficiency 10/25/2017  . Postoperative delirium 10/25/2017  . Dyslipidemia 10/17/2017  . SOB (shortness of breath) 03/31/2017  . Long term current use of aspirin 03/29/2017  . Parkinson's disease ([place]) 01/02/2017  . Morbid (severe) obesity due to excess calories ([place]) 10/27/2015  . Memory change 07/06/2015  . Depression 07/06/2015  . Medial meniscus tear 10/11/2011  . Neuroma of foot 10/11/2011  . Coronary artery disease involving native coronary artery of native heart with angina pectoris ([place]) 12/28/2010  . 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 Mixed hyperlipidemia 05/20/2010  . Hypertensive heart disease with heart failure ([place]) 05/20/2010  . 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 GERD 05/20/2010  . TRANSIENT ISCHEMIC ATTACK, HX OF 05/20/2010  . NEPHROLITHIASIS, HX OF 05/20/2010  . BENIGN PROSTATIC HYPERTROPHY, HX OF, S/P TURP 05/20/2010    Cora Stetson W. 05/31/2019, 3:03 PM Frazier Butt., PT  [facility] [address] [place], [place], [address] Phone: [phone]   Fax:  [phone]  Name: GLYNDON RAILE MRN:  [id number] Date of Birth: Jan 21, 1946

## 2019-06-03 ENCOUNTER — Other Ambulatory Visit: Payer: Self-pay

## 2019-06-03 ENCOUNTER — Ambulatory Visit: Payer: Medicare Other | Attending: Neurology | Admitting: Physical Therapy

## 2019-06-03 ENCOUNTER — Encounter: Payer: Self-pay | Admitting: Physical Therapy

## 2019-06-03 VITALS — HR 94

## 2019-06-03 DIAGNOSIS — M6281 Muscle weakness (generalized): Secondary | ICD-10-CM | POA: Diagnosis not present

## 2019-06-03 DIAGNOSIS — R2689 Other abnormalities of gait and mobility: Secondary | ICD-10-CM

## 2019-06-03 DIAGNOSIS — R2681 Unsteadiness on feet: Secondary | ICD-10-CM | POA: Insufficient documentation

## 2019-06-03 NOTE — Therapy (Signed)
[facility] [address] [place], [place], [address] Phone: [phone]   Fax:  [phone]  Physical Therapy Treatment  Patient Details  Name: Shawn Meza MRN: [id number] Date of Birth: 08/18/1945 Referring Provider (PT): [name], Wells Guiles   Encounter Date: 06/03/2019  PT End of Session - 06/03/19 1546    Visit Number  2    Number of Visits  9    Date for PT Re-Evaluation  07/30/19    Authorization Type  UHC Medicare/Medicaid    PT Start Time  1448    PT Stop Time  1532    PT Time Calculation (min)  44 min    Activity Tolerance  Patient tolerated treatment well    Behavior During Therapy  [facility] for tasks assessed/performed       Past Medical History:  Diagnosis Date  . Arthritis   . BPH (benign prostatic hypertrophy)   . Chronic coronary artery disease   . Colon cancer ([place])   . Elevated PSA   . Erectile dysfunction   . Essential hypertension   . Essential tremor   . GERD (gastroesophageal reflux disease)   . Headache(784.0)   . Hiatal hernia   . Hypercholesterolemia   . Long-term use of aspirin therapy   . Major depression, chronic   . Medial meniscus tear 10/11/2011  . Metabolic syndrome   . Morbid obesity ([place])   . Nephrolithiasis    hx of  . NSTEMI (non-ST elevated myocardial infarction) ([place])   . Parkinson's disease ([place])   . S/P CABG (coronary artery bypass graft)   . Transient ischemic attack    hx of  . Trochanteric bursitis of right hip     Past Surgical History:  Procedure Laterality Date  . CARDIAC CATHETERIZATION  5/12,1/13   4 stents placed  . COLON SURGERY    . CORONARY ARTERY BYPASS GRAFT    . KNEE ARTHROSCOPY  10/11/2011   Procedure: ARTHROSCOPY KNEE;  Surgeon: Lorn Junes, MD;  Location: [facility];  Service: Orthopedics;  Laterality: Left;  Left Knee Arthroscopy with Medial and Lateral Partial Menisectomy, Chondroplasty  . LEFT HEART CATHETERIZATION WITH  CORONARY ANGIOGRAM N/A 08/25/2011   Procedure: LEFT HEART CATHETERIZATION WITH CORONARY ANGIOGRAM;  Surgeon: Burnell Blanks, MD;  Location[facility] C[facility] LAB;  Service: Cardiovascular;  Laterality: N/A;  . LITHOTRIPSY    . STERIOD INJECTION  10/11/2011   Procedure: STEROID INJECTION;  Surgeon: Lorn Junes, MD;  Location: [facility];  Service: Orthopedics;  Laterality: Right;  Steroid Injection Second Toe  . TRANSURETHRAL RESECTION OF PROSTATE    . URETHRAL DILATION      Vitals:   06/03/19 1453  Pulse: 94  SpO2: 96%    Subjective Assessment - 06/03/19 1453    Subjective  Pt reports having a fall after his last session.  Was at home getting up from a chair because his dogs were barking and he thinks he got up too fast.  Fell onto rocking chair with his right shoulder.  Having pain and can't lift in any direction.    Pertinent History  hospitalized 2019 s/p CABG x 5, ischemic cardiomyopathy,CHF, Parkinson's disease x 4 years    Patient Stated Goals  Pt's goal for therapy is to be able to do things without pain and being worn out.    Currently in Pain?  Yes    Pain Score  6    denies any pain at rest,  only with movement   Pain Location  Shoulder    Pain Orientation  Right    Pain Descriptors / Indicators  Cramping    Pain Type  Acute pain    Pain Onset  In the past 7 days    Pain Frequency  Intermittent    Aggravating Factors   moving it    Pain Relieving Factors  not moving it    Multiple Pain Sites  Yes    Pain Score  1    Pain Location  Back    Pain Orientation  Left;Lower;Mid   worse on Left but runs across back   Pain Type  Chronic pain   > 1 year   Pain Radiating Towards  across back    Pain Onset  More than a month ago    Pain Frequency  Intermittent    Aggravating Factors   walking and standing    Pain Relieving Factors  sitting    Pain Onset  More than a month ago          [facility] Adult PT Treatment/Exercise - 06/03/19 0001      Transfers    Transfers  Sit to Stand;Stand to Sit    Sit to Stand  5: Supervision    Sit to Stand Details  Verbal cues for technique   to not push back against mat   Stand to Sit  5: Supervision    Number of Reps  10 reps    Comments  provided as HEP      Ambulation/Gait   Ambulation/Gait  Yes    Ambulation/Gait Assistance  6: Modified independent (Device/Increase time)    Ambulation Distance (Feet)  573 Feet   during 3MWT   Assistive device  None    Gait Pattern  Step-through pattern;Decreased arm swing - left;Decreased step length - left;Decreased stance time - left;Decreased trunk rotation;Poor foot clearance - left;Decreased dorsiflexion - left    Ambulation Surface  Level;Indoor    Gait Comments  573 ft for 3MWT;Pt reports pain in L low back at end of 3 minutes.  Rates pain 3-4/10.      Self-Care   Self-Care  Other Self-Care Comments    Other Self-Care Comments   Fall prevention education, calling MD concerning R shoulder pain post fall             PT Education - 06/03/19 1545    Education Details  Fall prevenition, calling MD concerning R shoulder injury with fall, sit<>stand    Person(s) Educated  Patient    Methods  Explanation;Demonstration;Handout    Comprehension  Verbalized understanding;Returned demonstration          PT Long Term Goals - 05/31/19 1442      PT LONG TERM GOAL #1   Title  Pt will be independent with HEP for improved transfers, gait, and trunk flexibility/decreased pain.  TARGET 06/28/2019    Time  4    Period  Weeks    Status  New    Target Date  06/28/19      PT LONG TERM GOAL #2   Title  Pt will perform 5x sit<>stand to less than or equal to 12.5 seconds for improved functional lower extremity strength.    Time  4    Period  Weeks    Status  New    Target Date  06/28/19      PT LONG TERM GOAL #3   Title  6MWT to be performed, with goal  to be written as appropriate.    Time  4    Period  Weeks    Status  New    Target Date  06/28/19       PT LONG TERM GOAL #4   Title  DGI score to be assessed with goal to be written as appropriate.    Time  4    Period  Weeks    Status  New    Target Date  06/28/19      PT LONG TERM GOAL #5   Title  Pt will verbalize understanding of local Parkinson's disease resources.    Time  4    Period  Weeks    Status  New    Target Date  06/28/19            Plan - 06/03/19 1547    Clinical Impression Statement  Skilled session limited on mobility today due to pt having fall after last session with decreased AROM of R shoulder and pain with movement.  Discussed with Mady Haagensen, PT, at beginning of session and she also spoke with pt.  Recommended for pt to contact his primary care MD concerning fall and shoulder pain.  Pt agrees.  Continue PT per POC as condition allows.    Personal Factors and Comorbidities  Comorbidity 3+    Comorbidities  See medical history in subjective (cardiac history)    Examination-Activity Limitations  Stand;Locomotion Level;Transfers    Examination-Participation Restrictions  Yard Work   Acitivites associated with raises puppies   Stability/Clinical Decision Making  Evolving/Moderate complexity    Rehab Potential  Good    PT Frequency  2x / week    PT Duration  4 weeks   plus eval   PT Treatment/Interventions  ADLs/Self Care Home Management;Gait training;Functional mobility training;Therapeutic activities;Therapeutic exercise;Balance training;DME Instruction;Neuromuscular re-education;Patient/family education    PT Next Visit Plan  Follow up/ask pt if he saw Primary care md concerning shoulder pain after fall.   Do DGI if able; Eventually Initiate HEP-PWR! Moves sitting, standing to incorporate trunk flexibility, coordinated UE movements (as R shoulder allows) ; gait with arm swing, incr. L step length    PT Home Exercise Plan  Sit<>stand;see pt instructions    Consulted and Agree with Plan of Care  Patient       Patient will benefit from skilled therapeutic  intervention in order to improve the following deficits and impairments:  Abnormal gait, Decreased coordination, Difficulty walking, Decreased strength, Decreased mobility, Postural dysfunction, Pain, Decreased balance  Visit Diagnosis: Other abnormalities of gait and mobility  Unsteadiness on feet  Muscle weakness (generalized)     Problem List Patient Active Problem List   Diagnosis Date Noted  . Fatigue 01/15/2019  . Chronic systolic (congestive) heart failure ([place]) 09/18/2018  . Ischemic cardiomyopathy 09/16/2018  . Aortic regurgitation 09/16/2018  . Cardiomyopathy, unspecified ([place]) 06/13/2018  . Abscess of right axilla 12/12/2017  . BMI 33.0-33.9,adult 12/12/2017  . 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  . Acute blood loss anemia 10/25/2017  . Acute postoperative respiratory insufficiency 10/25/2017  . Postoperative delirium 10/25/2017  . Dyslipidemia 10/17/2017  . SOB (shortness of breath) 03/31/2017  . Long term current use of aspirin 03/29/2017  . Parkinson's disease ([place]) 01/02/2017  . Morbid (severe) obesity due to excess calories ([place]) 10/27/2015  . Memory change 07/06/2015  . Depression 07/06/2015  . Medial meniscus tear 10/11/2011  . Neuroma of foot 10/11/2011  . Coronary artery disease involving native coronary  artery of native heart with angina pectoris ([place]) 12/28/2010  . 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 Mixed hyperlipidemia 05/20/2010  . Hypertensive heart disease with heart failure ([place]) 05/20/2010  . 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 GERD 05/20/2010  . TRANSIENT ISCHEMIC ATTACK, HX OF 05/20/2010  . NEPHROLITHIASIS, HX OF 05/20/2010  . BENIGN PROSTATIC HYPERTROPHY, HX OF, S/P TURP 05/20/2010    Narda Bonds, PTA [facility] 06/03/19 3:52 PM Phone: [phone] Fax: [facility] [address]  [place], [place], [address] Phone: [phone]   Fax:  [phone]  Name: ANTWAIN DAM MRN: [id number] Date of Birth: [birth year]

## 2019-06-03 NOTE — Patient Instructions (Signed)

## 2019-06-04 ENCOUNTER — Encounter: Payer: Self-pay | Admitting: Family Medicine

## 2019-06-04 ENCOUNTER — Other Ambulatory Visit: Payer: Self-pay | Admitting: Cardiology

## 2019-06-04 ENCOUNTER — Ambulatory Visit (INDEPENDENT_AMBULATORY_CARE_PROVIDER_SITE_OTHER): Payer: Medicare Other | Admitting: Family Medicine

## 2019-06-04 ENCOUNTER — Ambulatory Visit (HOSPITAL_BASED_OUTPATIENT_CLINIC_OR_DEPARTMENT_OTHER)
Admission: RE | Admit: 2019-06-04 | Discharge: 2019-06-04 | Disposition: A | Discharge status: Home / Self Care | Payer: Medicare Other | Source: Ambulatory Visit | Attending: Family Medicine | Admitting: Family Medicine

## 2019-06-04 ENCOUNTER — Other Ambulatory Visit: Payer: Self-pay

## 2019-06-04 VITALS — BP 118/78 | HR 91 | Temp 97.7°F | Ht 66.0 in | Wt 210.0 lb

## 2019-06-04 DIAGNOSIS — M25511 Pain in right shoulder: Secondary | ICD-10-CM | POA: Diagnosis not present

## 2019-06-04 DIAGNOSIS — I2510 Atherosclerotic heart disease of native coronary artery without angina pectoris: Secondary | ICD-10-CM

## 2019-06-04 DIAGNOSIS — K219 Gastro-esophageal reflux disease without esophagitis: Secondary | ICD-10-CM | POA: Diagnosis not present

## 2019-06-04 DIAGNOSIS — S4991XA Unspecified injury of right shoulder and upper arm, initial encounter: Secondary | ICD-10-CM | POA: Diagnosis not present

## 2019-06-04 NOTE — Progress Notes (Signed)
Musculoskeletal Exam  Patient: Shawn Shawn Meza DOB: 11/15/1945  DOS: 06/04/2019  SUBJECTIVE:  Chief Complaint:   Chief Complaint  Patient presents with  . Fall    fell Friday and hurt right shoulder  . Medication Problem    acid medication    MEKHAI Shawn Shawn Meza is a 73 y.o.  male for evaluation and treatment of R shoulder pain.   Onset:  4 days ago. Fell on area from a recliner onto concrete Location: R shoulder Character:  aching  Progression of issue:  has slightly improved Associated symptoms: bruising, decreased ROm Treatment: to date has been ice and heat.   Neurovascular symptoms: no  Patient was being treated for reflux and had his Nexium changed to Protonix.  His symptoms flared relatively severely for 3 days following this change in a switch back to Nexium.  He is taking on a daily basis.  Currently well controlled.  ROS: Musculoskeletal/Extremities: +R shoulder pain GI: No vomiting  Past Medical History:  Diagnosis Date  . Arthritis   . BPH (benign prostatic hypertrophy)   . Chronic coronary artery disease   . Colon cancer ([place])   . Elevated PSA   . Erectile dysfunction   . Essential hypertension   . Essential tremor   . GERD (gastroesophageal reflux disease)   . Headache(784.0)   . Hiatal hernia   . Hypercholesterolemia   . Long-term use of aspirin therapy   . Major depression, chronic   . Medial meniscus tear 10/11/2011  . Metabolic syndrome   . Morbid obesity ([place])   . Nephrolithiasis    hx of  . NSTEMI (non-ST elevated myocardial infarction) ([place])   . Parkinson's disease ([place])   . S/P CABG (coronary artery bypass graft)   . Transient ischemic attack    hx of  . Trochanteric bursitis of right hip     Objective: VITAL SIGNS: BP 118/78 (BP Location: Left Arm, Patient Position: Sitting, Cuff Size: Normal)   Pulse 91   Temp 97.7 F (36.5 C) (Temporal)   Ht 5\' 6"  (1.676 m)   Wt 210 lb (95.3 kg)   SpO2 96%   BMI 33.89 kg/m  Constitutional: Well  formed, well developed. No acute distress. Cardiovascular: Brisk cap refill Thorax & Lungs: No accessory muscle use Musculoskeletal: Right shoulder.   Normal active range of motion: no.   Normal passive range of motion: yes Tenderness to palpation: Yes, over the acromion Deformity: No Ecchymosis: yes Tests positive: None Tests negative: Neer's, Hawkins, crossover, speeds, liftoff Neurologic: Normal sensory function. No focal deficits noted.  Psychiatric: Normal mood. Age appropriate judgment and insight. Alert & oriented x 3.    Assessment:  Acute pain of right shoulder - Plan: DG Shoulder Right  Gastroesophageal reflux disease, unspecified whether esophagitis present  Plan: 1-check x-ray, should be good for physical therapy if no fracture.  Ice, stretches and exercises, Tylenol. 2-go back to Nexium, use Pepcid as needed. F/u as originally scheduled. The patient voiced understanding and agreement to the plan.   [place], DO 06/04/19  2:06 PM

## 2019-06-04 NOTE — Patient Instructions (Signed)
Ice/cold pack over area for 10-15 min twice daily.  OK to take Tylenol 1000 mg (2 extra strength tabs) or 975 mg (3 regular strength tabs) every 6 hours as needed.  Pepcid (famotidine) 20 mg twice daily in addition to the Nexium if you are having issues.   We will be in touch regarding your X-ray results. That would be the only thing that would prevent you from safely doing physical therapy.   Let us know if you need anything.  EXERCISES  RANGE OF MOTION (ROM) AND STRETCHING EXERCISES These exercises may help you when beginning to rehabilitate your injury. While completing these exercises, remember:   Restoring tissue flexibility helps normal motion to return to the joints. This allows healthier, less painful movement and activity.  An effective stretch should be held for at least 30 seconds.  A stretch should never be painful. You should only feel a gentle lengthening or release in the stretched tissue.  ROM - Pendulum  Bend at the waist so that your right / left arm falls away from your body. Support yourself with your opposite hand on a solid surface, such as a table or a countertop.  Your right / left arm should be perpendicular to the ground. If it is not perpendicular, you need to lean over farther. Relax the muscles in your right / left arm and shoulder as much as possible.  Gently sway your hips and trunk so they move your right / left arm without any use of your right / left shoulder muscles.  Progress your movements so that your right / left arm moves side to side, then forward and backward, and finally, both clockwise and counterclockwise.  Complete 10-15 repetitions in each direction. Many people use this exercise to relieve discomfort in their shoulder as well as to gain range of motion. Repeat 2 times. Complete this exercise 3 times per week.  STRETCH - Flexion, Standing  Stand with good posture. With an underhand grip on your right / left hand and an overhand grip on  the opposite hand, grasp a broomstick or cane so that your hands are a little more than shoulder-width apart.  Keeping your right / left elbow straight and shoulder muscles relaxed, push the stick with your opposite hand to raise your right / left arm in front of your body and then overhead. Raise your arm until you feel a stretch in your right / left shoulder, but before you have increased shoulder pain.  Try to avoid shrugging your right / left shoulder as your arm rises by keeping your shoulder blade tucked down and toward your mid-back spine. Hold 30 seconds.  Slowly return to the starting position. Repeat 2 times. Complete this exercise 3 times per week.  STRETCH - Internal Rotation  Place your right / left hand behind your back, palm-up.  Throw a towel or belt over your opposite shoulder. Grasp the towel/belt with your right / left hand.  While keeping an upright posture, gently pull up on the towel/belt until you feel a stretch in the front of your right / left shoulder.  Avoid shrugging your right / left shoulder as your arm rises by keeping your shoulder blade tucked down and toward your mid-back spine.  Hold 30. Release the stretch by lowering your opposite hand. Repeat 2 times. Complete this exercise 3 times per week.  STRETCH - External Rotation and Abduction  Stagger your stance through a doorframe. It does not matter which foot is forward.  As instructed by your physician, physical therapist or athletic trainer, place your hands: ? And forearms above your head and on the door frame. ? And forearms at head-height and on the door frame. ? At elbow-height and on the door frame.  Keeping your head and chest upright and your stomach muscles tight to prevent over-extending your low-back, slowly shift your weight onto your front foot until you feel a stretch across your chest and/or in the front of your shoulders.  Hold 30 seconds. Shift your weight to your back foot to  release the stretch. Repeat 2 times. Complete this stretch 3 times per week.   STRENGTHENING EXERCISES  These exercises may help you when beginning to rehabilitate your injury. They may resolve your symptoms with or without further involvement from your physician, physical therapist or athletic trainer. While completing these exercises, remember:   Muscles can gain both the endurance and the strength needed for everyday activities through controlled exercises.  Complete these exercises as instructed by your physician, physical therapist or athletic trainer. Progress the resistance and repetitions only as guided.  You may experience muscle soreness or fatigue, but the pain or discomfort you are trying to eliminate should never worsen during these exercises. If this pain does worsen, stop and make certain you are following the directions exactly. If the pain is still present after adjustments, discontinue the exercise until you can discuss the trouble with your clinician.  If advised by your physician, during your recovery, avoid activity or exercises which involve actions that place your right / left hand or elbow above your head or behind your back or head. These positions stress the tissues which are trying to heal.  STRENGTH - Scapular Depression and Adduction  With good posture, sit on a firm chair. Supported your arms in front of you with pillows, arm rests or a table top. Have your elbows in line with the sides of your body.  Gently draw your shoulder blades down and toward your mid-back spine. Gradually increase the tension without tensing the muscles along the top of your shoulders and the back of your neck.  Hold for 3 seconds. Slowly release the tension and relax your muscles completely before completing the next repetition.  After you have practiced this exercise, remove the arm support and complete it in standing as well as sitting. Repeat 2 times. Complete this exercise 3 times per  week.   STRENGTH - External Rotators  Secure a rubber exercise band/tubing to a fixed object so that it is at the same height as your right / left elbow when you are standing or sitting on a firm surface.  Stand or sit so that the secured exercise band/tubing is at your side that is not injured.  Bend your elbow 90 degrees. Place a folded towel or small pillow under your right / left arm so that your elbow is a few inches away from your side.  Keeping the tension on the exercise band/tubing, pull it away from your body, as if pivoting on your elbow. Be sure to keep your body steady so that the movement is only coming from your shoulder rotating.  Hold 3 seconds. Release the tension in a controlled manner as you return to the starting position. Repeat 2 times. Complete this exercise 3 times per week.   STRENGTH - Supraspinatus  Stand or sit with good posture. Grasp a 2-3 lb weight or an exercise band/tubing so that your hand is "thumbs-up," like when you shake  hands.  Slowly lift your right / left hand from your thigh into the air, traveling about 30 degrees from straight out at your side. Lift your hand to shoulder height or as far as you can without increasing any shoulder pain. Initially, many people do not lift their hands above shoulder height.  Avoid shrugging your right / left shoulder as your arm rises by keeping your shoulder blade tucked down and toward your mid-back spine.  Hold for 3 seconds. Control the descent of your hand as you slowly return to your starting position. Repeat 2 times. Complete this exercise 3 times per week.   STRENGTH - Shoulder Extensors  Secure a rubber exercise band/tubing so that it is at the height of your shoulders when you are either standing or sitting on a firm arm-less chair.  With a thumbs-up grip, grasp an end of the band/tubing in each hand. Straighten your elbows and lift your hands straight in front of you at shoulder height. Step back away  from the secured end of band/tubing until it becomes tense.  Squeezing your shoulder blades together, pull your hands down to the sides of your thighs. Do not allow your hands to go behind you.  Hold for 3 seconds. Slowly ease the tension on the band/tubing as you reverse the directions and return to the starting position. Repeat 2 times. Complete this exercise 3 times per week.   STRENGTH - Scapular Retractors  Secure a rubber exercise band/tubing so that it is at the height of your shoulders when you are either standing or sitting on a firm arm-less chair.  With a palm-down grip, grasp an end of the band/tubing in each hand. Straighten your elbows and lift your hands straight in front of you at shoulder height. Step back away from the secured end of band/tubing until it becomes tense.  Squeezing your shoulder blades together, draw your elbows back as you bend them. Keep your upper arm lifted away from your body throughout the exercise.  Hold 3 seconds. Slowly ease the tension on the band/tubing as you reverse the directions and return to the starting position. Repeat 2 times. Complete this exercise 3 times per week.  STRENGTH - Scapular Depressors  Find a sturdy chair without wheels, such as a from a dining room table.  Keeping your feet on the floor, lift your bottom from the seat and lock your elbows.  Keeping your elbows straight, allow gravity to pull your body weight down. Your shoulders will rise toward your ears.  Raise your body against gravity by drawing your shoulder blades down your back, shortening the distance between your shoulders and ears. Although your feet should always maintain contact with the floor, your feet should progressively support less body weight as you get stronger.  Hold 3 seconds. In a controlled and slow manner, lower your body weight to begin the next repetition. Repeat 2 times. Complete this exercise 3 times per week.   This information is not  intended to replace advice given to you by your health care provider. Make sure you discuss any questions you have with your health care provider.  Document Released: 06/01/2005 Document Revised: 08/08/2014 Document Reviewed: 10/30/2008 Elsevier Interactive Patient Education [employer].

## 2019-06-05 ENCOUNTER — Encounter: Payer: Self-pay | Admitting: Physical Therapy

## 2019-06-05 ENCOUNTER — Ambulatory Visit: Payer: Medicare Other | Admitting: Physical Therapy

## 2019-06-05 DIAGNOSIS — R2689 Other abnormalities of gait and mobility: Secondary | ICD-10-CM | POA: Diagnosis not present

## 2019-06-05 DIAGNOSIS — M6281 Muscle weakness (generalized): Secondary | ICD-10-CM | POA: Diagnosis not present

## 2019-06-05 DIAGNOSIS — R2681 Unsteadiness on feet: Secondary | ICD-10-CM | POA: Diagnosis not present

## 2019-06-05 NOTE — Therapy (Signed)
[facility] [address] [place], [place], [address] Phone: [phone]   Fax:  [phone]  Physical Therapy Treatment  Patient Details  Name: Shawn Meza MRN: [id number] Date of Birth: 01-Aug-1946 Referring Provider (PT): [name], Wells Guiles   Encounter Date: 06/05/2019  PT End of Session - 06/05/19 2050    Visit Number  3    Number of Visits  9    Date for PT Re-Evaluation  07/30/19    Authorization Type  [employer] Medicare/Medicaid    PT Start Time  1630   pt arrived late to session due to bad traffic   PT Stop Time  1707    PT Time Calculation (min)  37 min    Equipment Utilized During Treatment  Gait belt    Activity Tolerance  Patient tolerated treatment well    Behavior During Therapy  [facility] for tasks assessed/performed       Past Medical History:  Diagnosis Date  . Arthritis   . BPH (benign prostatic hypertrophy)   . Chronic coronary artery disease   . Colon cancer ([place])   . Elevated PSA   . Erectile dysfunction   . Essential hypertension   . Essential tremor   . GERD (gastroesophageal reflux disease)   . Headache(784.0)   . Hiatal hernia   . Hypercholesterolemia   . Long-term use of aspirin therapy   . Major depression, chronic   . Medial meniscus tear 10/11/2011  . Metabolic syndrome   . Morbid obesity ([place])   . Nephrolithiasis    hx of  . NSTEMI (non-ST elevated myocardial infarction) ([place])   . Parkinson's disease ([place])   . S/P CABG (coronary artery bypass graft)   . Transient ischemic attack    hx of  . Trochanteric bursitis of right hip     Past Surgical History:  Procedure Laterality Date  . CARDIAC CATHETERIZATION  5/12,1/13   4 stents placed  . COLON SURGERY    . CORONARY ARTERY BYPASS GRAFT    . KNEE ARTHROSCOPY  10/11/2011   Procedure: ARTHROSCOPY KNEE;  Surgeon: Lorn Junes, MD;  Location: [facility];  Service: Orthopedics;  Laterality: Left;  Left Knee Arthroscopy  with Medial and Lateral Partial Menisectomy, Chondroplasty  . LEFT HEART CATHETERIZATION WITH CORONARY ANGIOGRAM N/A 08/25/2011   Procedure: LEFT HEART CATHETERIZATION WITH CORONARY ANGIOGRAM;  Surgeon: Burnell Blanks, MD;  Location: [facility] CATH LAB;  Service: Cardiovascular;  Laterality: N/A;  . LITHOTRIPSY    . STERIOD INJECTION  10/11/2011   Procedure: STEROID INJECTION;  Surgeon: Lorn Junes, MD;  Location: [facility];  Service: Orthopedics;  Laterality: Right;  Steroid Injection Second Toe  . TRANSURETHRAL RESECTION OF PROSTATE    . URETHRAL DILATION      There were no vitals filed for this visit.  Subjective Assessment - 06/05/19 1634    Subjective  Went to the doctor yesterday for his R shoulder. X-ray findings: No acute fracture or dislocation is noted. likely related to underlying rotator cuff injury. States that his back has been bothersome especially after trying to walk more on his heels.    Pertinent History  hospitalized 2019 s/p CABG x 5, ischemic cardiomyopathy,CHF, Parkinson's disease x 4 years    Patient Stated Goals  Pt's goal for therapy is to be able to do things without pain and being worn out.    Currently in Pain?  Yes    Pain Score  6  Pain Location  Back    Pain Orientation  Lower    Pain Descriptors / Indicators  Aching    Pain Onset  In the past 7 days    Pain Onset  More than a month ago    Pain Onset  More than a month ago                       [facility] Adult PT Treatment/Exercise - 06/06/19 1145      Transfers   Transfers  Sit to Stand;Stand to Sit    Sit to Stand  5: Supervision    Sit to Stand Details  Verbal cues for technique    Sit to Stand Details (indicate cue type and reason)  Cues for shifting weight anteriorly as to note brace BLEs against mat, 1 x 10 reps practicing with control and coming to stand and practicing gentle lateral weight shifts before taking a step and walking (due to pt's previous fall from  getting up too quickly)    Stand to Sit  5: Supervision      Ambulation/Gait   Ambulation/Gait  Yes    Ambulation/Gait Assistance  5: Supervision    Ambulation/Gait Assistance Details  Cues for reciprocal arm swing, pt demonstrating good carryover, gentle heel > toe pattern (as pt tended to overexggerate and he states it made his back hurt). Pt with difficulty performing L heel strike even with cues.    Ambulation Distance (Feet)  230 Feet    Assistive device  None    Gait Pattern  Step-through pattern;Decreased arm swing - left;Decreased step length - left;Decreased stance time - left;Decreased trunk rotation;Poor foot clearance - left;Decreased dorsiflexion - left    Ambulation Surface  Level;Indoor      Neuro Re-ed    Neuro Re-ed Details   Educated pt on purpose of PWR! moves exercises and how they relate to functional mobility/balance for individuals with PD (pt has not received PT in the past for impairments related to PD)         Pt performs PWR! Moves in sitting position 2 x 10 reps - provided for initial HEP    PWR! Up for improved posture - without UE extension due to R shoulder pain, pt reporting improvement in back pain after performing   PWR! Step for improved step initiation -cues for increased step length, especially with LLE   Cues provided for technique and performing with 6/10 effort.   Unable to perform remainder of 2/4 exercises due to pt R shoulder discomfort.       PT Education - 06/05/19 2049    Education Details  initial HEP - seated PWR posture and step initiation, proper sit <> stand technique.    Person(s) Educated  Patient    Methods  Explanation;Demonstration;Handout    Comprehension  Verbalized understanding;Returned demonstration          PT Long Term Goals - 05/31/19 1442      PT LONG TERM GOAL #1   Title  Pt will be independent with HEP for improved transfers, gait, and trunk flexibility/decreased pain.  TARGET 06/28/2019    Time  4     Period  Weeks    Status  New    Target Date  06/28/19      PT LONG TERM GOAL #2   Title  Pt will perform 5x sit<>stand to less than or equal to 12.5 seconds for improved functional lower extremity strength.  Time  4    Period  Weeks    Status  New    Target Date  06/28/19      PT LONG TERM GOAL #3   Title  6MWT to be performed, with goal to be written as appropriate.    Time  4    Period  Weeks    Status  New    Target Date  06/28/19      PT LONG TERM GOAL #4   Title  DGI score to be assessed with goal to be written as appropriate.    Time  4    Period  Weeks    Status  New    Target Date  06/28/19      PT LONG TERM GOAL #5   Title  Pt will verbalize understanding of local Parkinson's disease resources.    Time  4    Period  Weeks    Status  New    Target Date  06/28/19            Plan - 06/06/19 1140    Clinical Impression Statement  Pt went to see MD regarding R shoulder pain - had x-rays done which showed no acute fracture. Focus of today's skilled session included sit <> stand and gait training, administering initial HEP for seated PWR moves (able to administer 2/4 today due to pt still having R shoulder discomfort). Pt able to demonstrate improved reciprocal arm swing with cueing. Practiced proper sit <> stand technique as pt's recent fall was from pt getting up too quickly. Pt reporting his low back felt better after performing seated PWR moves for posture. Will continue to progress towards LTGs.    Personal Factors and Comorbidities  Comorbidity 3+    Comorbidities  See medical history in subjective (cardiac history)    Examination-Activity Limitations  Stand;Locomotion Level;Transfers    Examination-Participation Restrictions  Yard Work   Acitivites associated with raises puppies   Stability/Clinical Decision Making  Evolving/Moderate complexity    Rehab Potential  Good    PT Frequency  2x / week    PT Duration  4 weeks   plus eval   PT  Treatment/Interventions  ADLs/Self Care Home Management;Gait training;Functional mobility training;Therapeutic activities;Therapeutic exercise;Balance training;DME Instruction;Neuromuscular re-education;Patient/family education    PT Next Visit Plan  Do DGI if able; how was PWR! HEP? - see if can progress with shoulder, standing to incorporate trunk flexibility, any stretches for low back, coordinated UE movements (as R shoulder allows) ; gait with arm swing, incr. L step length    PT Home Exercise Plan  Sit<>stand;see pt instructions    Consulted and Agree with Plan of Care  Patient       Patient will benefit from skilled therapeutic intervention in order to improve the following deficits and impairments:  Abnormal gait, Decreased coordination, Difficulty walking, Decreased strength, Decreased mobility, Postural dysfunction, Pain, Decreased balance  Visit Diagnosis: Other abnormalities of gait and mobility  Unsteadiness on feet  Muscle weakness (generalized)     Problem List Patient Active Problem List   Diagnosis Date Noted  . Fatigue 01/15/2019  . Chronic systolic (congestive) heart failure ([place]) 09/18/2018  . Ischemic cardiomyopathy 09/16/2018  . Aortic regurgitation 09/16/2018  . Cardiomyopathy, unspecified ([place]) 06/13/2018  . Abscess of right axilla 12/12/2017  . BMI 33.0-33.9,adult 12/12/2017  . 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  . Acute blood loss anemia 10/25/2017  . Acute postoperative respiratory insufficiency 10/25/2017  . Postoperative delirium  10/25/2017  . Dyslipidemia 10/17/2017  . SOB (shortness of breath) 03/31/2017  . Long term current use of aspirin 03/29/2017  . Parkinson's disease ([place]) 01/02/2017  . Morbid (severe) obesity due to excess calories ([place]) 10/27/2015  . Memory change 07/06/2015  . Depression 07/06/2015  . Medial meniscus tear 10/11/2011  . Neuroma of foot 10/11/2011  . Coronary artery disease involving native coronary  artery of native heart with angina pectoris ([place]) 12/28/2010  . 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 Mixed hyperlipidemia 05/20/2010  . Hypertensive heart disease with heart failure ([place]) 05/20/2010  . 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 GERD 05/20/2010  . TRANSIENT ISCHEMIC ATTACK, HX OF 05/20/2010  . NEPHROLITHIASIS, HX OF 05/20/2010  . BENIGN PROSTATIC HYPERTROPHY, HX OF, S/P TURP 05/20/2010    Arliss Journey, PT, DPT  06/06/2019, 11:49 AM  [facility] [address] [place], [place], [address] Phone: [phone]   Fax:  [phone]  Name: Shawn Meza MRN: [id number] Date of Birth: May 26, 1946

## 2019-06-07 ENCOUNTER — Ambulatory Visit: Payer: Medicare Other | Admitting: Family Medicine

## 2019-06-10 ENCOUNTER — Other Ambulatory Visit: Payer: Self-pay

## 2019-06-10 ENCOUNTER — Encounter: Payer: Self-pay | Admitting: Physical Therapy

## 2019-06-10 ENCOUNTER — Ambulatory Visit: Payer: Medicare Other | Admitting: Physical Therapy

## 2019-06-10 DIAGNOSIS — R2681 Unsteadiness on feet: Secondary | ICD-10-CM | POA: Diagnosis not present

## 2019-06-10 DIAGNOSIS — R2689 Other abnormalities of gait and mobility: Secondary | ICD-10-CM

## 2019-06-10 DIAGNOSIS — M6281 Muscle weakness (generalized): Secondary | ICD-10-CM

## 2019-06-10 NOTE — Therapy (Signed)
[facility] [address] [place], [place], [address] Phone: [phone]   Fax:  [phone]  Physical Therapy Treatment  Patient Details  Name: Shawn Meza MRN: [id number] Date of Birth: May 21, 1946 Referring Provider (PT): [name], Wells Guiles   Encounter Date: 06/10/2019  PT End of Session - 06/10/19 2140    Visit Number  4    Number of Visits  9    Date for PT Re-Evaluation  07/30/19    Authorization Type  UHC Medicare/Medicaid    PT Start Time  1448    PT Stop Time  1530    PT Time Calculation (min)  42 min    Equipment Utilized During Treatment  Gait belt    Activity Tolerance  Patient tolerated treatment well    Behavior During Therapy  [facility] for tasks assessed/performed       Past Medical History:  Diagnosis Date  . Arthritis   . BPH (benign prostatic hypertrophy)   . Chronic coronary artery disease   . Colon cancer ([place])   . Elevated PSA   . Erectile dysfunction   . Essential hypertension   . Essential tremor   . GERD (gastroesophageal reflux disease)   . Headache(784.0)   . Hiatal hernia   . Hypercholesterolemia   . Long-term use of aspirin therapy   . Major depression, chronic   . Medial meniscus tear 10/11/2011  . Metabolic syndrome   . Morbid obesity ([place])   . Nephrolithiasis    hx of  . NSTEMI (non-ST elevated myocardial infarction) ([place])   . Parkinson's disease ([place])   . S/P CABG (coronary artery bypass graft)   . Transient ischemic attack    hx of  . Trochanteric bursitis of right hip     Past Surgical History:  Procedure Laterality Date  . CARDIAC CATHETERIZATION  5/12,1/13   4 stents placed  . COLON SURGERY    . CORONARY ARTERY BYPASS GRAFT    . KNEE ARTHROSCOPY  10/11/2011   Procedure: ARTHROSCOPY KNEE;  Surgeon: Lorn Junes, MD;  Location: [facility];  Service: Orthopedics;  Laterality: Left;  Left Knee Arthroscopy with Medial and Lateral Partial Menisectomy,  Chondroplasty  . LEFT HEART CATHETERIZATION WITH CORONARY ANGIOGRAM N/A 08/25/2011   Procedure: LEFT HEART CATHETERIZATION WITH CORONARY ANGIOGRAM;  Surgeon: Burnell Blanks, MD;  Location: [facility] CATH LAB;  Service: Cardiovascular;  Laterality: N/A;  . LITHOTRIPSY    . STERIOD INJECTION  10/11/2011   Procedure: STEROID INJECTION;  Surgeon: Lorn Junes, MD;  Location: [facility];  Service: Orthopedics;  Laterality: Right;  Steroid Injection Second Toe  . TRANSURETHRAL RESECTION OF PROSTATE    . URETHRAL DILATION      There were no vitals filed for this visit.  Subjective Assessment - 06/10/19 1452    Subjective  His R shoulder is getting better. Felt sore after last time. Back is still feeling pretty tight, especially after walking for a while.    Pertinent History  hospitalized 2019 s/p CABG x 5, ischemic cardiomyopathy,CHF, Parkinson's disease x 4 years    Patient Stated Goals  Pt's goal for therapy is to be able to do things without pain and being worn out.    Currently in Pain?  No/denies    Pain Onset  In the past 7 days    Pain Onset  More than a month ago    Pain Onset  More than a month ago  [facility] PT Assessment - 06/10/19 1505      Dynamic Gait Index   Level Surface  Normal    Change in Gait Speed  Normal    Gait with Horizontal Head Turns  Mild Impairment    Gait with Vertical Head Turns  Mild Impairment    Gait and Pivot Turn  Normal    Step Over Obstacle  Mild Impairment    Step Around Obstacles  Normal    Steps  Mild Impairment    Total Score  20    DGI comment:  20/24                    [facility] Adult PT Treatment/Exercise - 06/11/19 [id number]      Ambulation/Gait   Ambulation/Gait  Yes    Ambulation/Gait Assistance  5: Supervision    Ambulation/Gait Assistance Details  Cues for reciprocal arm swing initially during bout, with good carryover after initial cueing.     Ambulation Distance (Feet)  230 Feet    Assistive device   None    Gait Pattern  Step-through pattern;Decreased arm swing - left;Decreased step length - left;Decreased stance time - left;Decreased trunk rotation;Poor foot clearance - left;Decreased dorsiflexion - left    Ambulation Surface  Level;Indoor      Neuro Re-ed    Neuro Re-ed Details   Standing at countertop but with no UE support: 2 x 10 reps B staggered stance A/P weight shifting - coming up onto toes and back onto heels while holding hamstring stretch.  Needed verbal and demonstrative cues throughout to perform correctly.       Exercises   Exercises  Other Exercises    Other Exercises   Seated hamstring stretch, cues for technique: 3 x 30 seconds B, added to HEP. Standing at countertop standing forward flexion stretch 3 x 15 seconds.           Pt performs PWR! Moves in standing position x 10 reps each   PWR! Up for improved posture - at edge of mat table, cues for scapular retraction and holding in position x3 seconds  PWR! Twist for improved trunk rotation - standing in corner, modified and just twisting across body with UE and tapping wall, attempted with B shoulder ABD, however pt reported increased R shoulder discomfort when performing this.   Verbal and demonstrative cues for technique.       PT Education - 06/10/19 2136    Education Details  seated hamstring stretch for HEP    Person(s) Educated  Patient    Methods  Explanation;Demonstration    Comprehension  Verbalized understanding;Returned demonstration          PT Long Term Goals - 06/10/19 2137      PT LONG TERM GOAL #1   Title  Pt will be independent with HEP for improved transfers, gait, and trunk flexibility/decreased pain.  TARGET 06/28/2019    Time  4    Period  Weeks    Status  New      PT LONG TERM GOAL #2   Title  Pt will perform 5x sit<>stand to less than or equal to 12.5 seconds for improved functional lower extremity strength.    Time  4    Period  Weeks    Status  New      PT LONG TERM  GOAL #3   Title  Pt will improve 6MWT distance to at least 630 ft indicating improved endurance for community distances.  Baseline  573 Feet    Time  4    Period  Weeks    Status  Revised      PT LONG TERM GOAL #4   Title  Patient will improve DGI score to a 22/24 in order to improve dynamic gait.    Baseline  20/24 on 06/10/19    Time  4    Period  Weeks    Status  Revised      PT LONG TERM GOAL #5   Title  Pt will verbalize understanding of local Parkinson's disease resources.    Time  4    Period  Weeks    Status  New            Plan - 06/11/19 0930    Clinical Impression Statement  Focus of today's skilled session was gait training, NMR for weight shifting, ROM, and beginning to initiate standing PWR moves. Pt with better participation in session today due to decresed R shoulder discomfort compared to previous visit. Performed DGI today with pt scoring 20/24, and wrote LTG as appropriate. Pt needing demonstrative cues throughout session today for correct technique for exercises. Will continue to progress towards LTGs.    Personal Factors and Comorbidities  Comorbidity 3+    Comorbidities  See medical history in subjective (cardiac history)    Examination-Activity Limitations  Stand;Locomotion Level;Transfers    Examination-Participation Restrictions  Yard Work   Acitivites associated with raises puppies   Stability/Clinical Decision Making  Evolving/Moderate complexity    Rehab Potential  Good    PT Frequency  2x / week    PT Duration  4 weeks   plus eval   PT Treatment/Interventions  ADLs/Self Care Home Management;Gait training;Functional mobility training;Therapeutic activities;Therapeutic exercise;Balance training;DME Instruction;Neuromuscular re-education;Patient/family education    PT Next Visit Plan  how is HEP, continue to progress PWR moves. - see if can progress with shoulder, standing to incorporate trunk flexibility, any stretches for low back, coordinated UE  movements (as R shoulder allows) ; gait with arm swing, incr. L step length    PT Home Exercise Plan  Sit<>stand;see pt instructions - seated hamstring stretch    Consulted and Agree with Plan of Care  Patient       Patient will benefit from skilled therapeutic intervention in order to improve the following deficits and impairments:  Abnormal gait, Decreased coordination, Difficulty walking, Decreased strength, Decreased mobility, Postural dysfunction, Pain, Decreased balance  Visit Diagnosis: Other abnormalities of gait and mobility  Unsteadiness on feet  Muscle weakness (generalized)     Problem List Patient Active Problem List   Diagnosis Date Noted  . Fatigue 01/15/2019  . Chronic systolic (congestive) heart failure ([place]) 09/18/2018  . Ischemic cardiomyopathy 09/16/2018  . Aortic regurgitation 09/16/2018  . Cardiomyopathy, unspecified ([place]) 06/13/2018  . Abscess of right axilla 12/12/2017  . BMI 33.0-33.9,adult 12/12/2017  . 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  . Acute blood loss anemia 10/25/2017  . Acute postoperative respiratory insufficiency 10/25/2017  . Postoperative delirium 10/25/2017  . Dyslipidemia 10/17/2017  . SOB (shortness of breath) 03/31/2017  . Long term current use of aspirin 03/29/2017  . Parkinson's disease ([place]) 01/02/2017  . Morbid (severe) obesity due to excess calories ([place]) 10/27/2015  . Memory change 07/06/2015  . Depression 07/06/2015  . Medial meniscus tear 10/11/2011  . Neuroma of foot 10/11/2011  . Coronary artery disease involving native coronary artery of native heart with angina pectoris ([place]) 12/28/2010  . OTHER TESTICULAR  HYPOFUNCTION 05/20/2010  . Mixed hyperlipidemia 05/20/2010  . Hypertensive heart disease with heart failure ([place]) 05/20/2010  . 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 GERD 05/20/2010  . TRANSIENT ISCHEMIC ATTACK, HX OF 05/20/2010  . NEPHROLITHIASIS, HX OF 05/20/2010  . BENIGN  PROSTATIC HYPERTROPHY, HX OF, S/P TURP 05/20/2010    Arliss Journey, PT, DPT  06/11/2019, 9:33 AM  [facility] [address] [place], [place], [address] Phone: [phone]   Fax:  [phone]  Name: SHAHZEB BAYAT MRN: [id number] Date of Birth: [birth year]

## 2019-06-12 ENCOUNTER — Other Ambulatory Visit: Payer: Self-pay

## 2019-06-12 ENCOUNTER — Ambulatory Visit: Payer: Medicare Other | Admitting: Physical Therapy

## 2019-06-12 DIAGNOSIS — M6281 Muscle weakness (generalized): Secondary | ICD-10-CM | POA: Diagnosis not present

## 2019-06-12 DIAGNOSIS — R2681 Unsteadiness on feet: Secondary | ICD-10-CM | POA: Diagnosis not present

## 2019-06-12 DIAGNOSIS — R2689 Other abnormalities of gait and mobility: Secondary | ICD-10-CM | POA: Diagnosis not present

## 2019-06-12 NOTE — Therapy (Signed)
[facility] [address] [place], [place], [address] Phone: [phone]   Fax:  [phone]  Physical Therapy Treatment  Patient Details  Name: Shawn Meza MRN: [id number] Date of Birth: 1945-11-22 Referring Provider (PT): [name], Wells Guiles   Encounter Date: 06/12/2019  PT End of Session - 06/12/19 1525    Visit Number  5    Number of Visits  9    Date for PT Re-Evaluation  07/30/19    Authorization Type  UHC Medicare/Medicaid    PT Start Time  [id number]    PT Stop Time  1447    PT Time Calculation (min)  43 min    Equipment Utilized During Treatment  Gait belt    Activity Tolerance  Patient tolerated treatment well    Behavior During Therapy  [facility] for tasks assessed/performed       Past Medical History:  Diagnosis Date  . Arthritis   . BPH (benign prostatic hypertrophy)   . Chronic coronary artery disease   . Colon cancer ([place])   . Elevated PSA   . Erectile dysfunction   . Essential hypertension   . Essential tremor   . GERD (gastroesophageal reflux disease)   . Headache(784.0)   . Hiatal hernia   . Hypercholesterolemia   . Long-term use of aspirin therapy   . Major depression, chronic   . Medial meniscus tear 10/11/2011  . Metabolic syndrome   . Morbid obesity ([place])   . Nephrolithiasis    hx of  . NSTEMI (non-ST elevated myocardial infarction) ([place])   . Parkinson's disease ([place])   . S/P CABG (coronary artery bypass graft)   . Transient ischemic attack    hx of  . Trochanteric bursitis of right hip     Past Surgical History:  Procedure Laterality Date  . CARDIAC CATHETERIZATION  5/12,1/13   4 stents placed  . COLON SURGERY    . CORONARY ARTERY BYPASS GRAFT    . KNEE ARTHROSCOPY  10/11/2011   Procedure: ARTHROSCOPY KNEE;  Surgeon: Lorn Junes, MD;  Location: [facility];  Service: Orthopedics;  Laterality: Left;  Left Knee Arthroscopy with Medial and Lateral Partial Menisectomy,  Chondroplasty  . LEFT HEART CATHETERIZATION WITH CORONARY ANGIOGRAM N/A 08/25/2011   Procedure: LEFT HEART CATHETERIZATION WITH CORONARY ANGIOGRAM;  Surgeon: Burnell Blanks, MD;  Location: [facility] CATH LAB;  Service: Cardiovascular;  Laterality: N/A;  . LITHOTRIPSY    . STERIOD INJECTION  10/11/2011   Procedure: STEROID INJECTION;  Surgeon: Lorn Junes, MD;  Location: [facility];  Service: Orthopedics;  Laterality: Right;  Steroid Injection Second Toe  . TRANSURETHRAL RESECTION OF PROSTATE    . URETHRAL DILATION      There were no vitals filed for this visit.  Subjective Assessment - 06/12/19 1411    Subjective  His R shoulder is continuing to get better.He has been using ice at home. States that his back is still really sore and wants to do more exercises for his back.    Pertinent History  hospitalized 2019 s/p CABG x 5, ischemic cardiomyopathy,CHF, Parkinson's disease x 4 years    Patient Stated Goals  Pt's goal for therapy is to be able to do things without pain and being worn out.    Currently in Pain?  No/denies    Pain Onset  In the past 7 days    Pain Onset  More than a month ago    Pain Onset  More than a month ago                    [facility] Adult PT Treatment/Exercise - 06/12/19 0001      Ambulation/Gait   Ambulation/Gait  Yes    Ambulation/Gait Assistance  5: Supervision    Ambulation/Gait Assistance Details  initial cues for L arm swing    Ambulation Distance (Feet)  115 Feet    Assistive device  None    Gait Pattern  Step-through pattern;Decreased arm swing - left;Decreased step length - left;Decreased stance time - left;Decreased trunk rotation;Poor foot clearance - left;Decreased dorsiflexion - left    Ambulation Surface  Level;Indoor      Neuro Re-ed    Neuro Re-ed Details   Standing at countertop with BUE support: A/P weight shifting with wide BOS shifting forward onto toes with upright posture and back onto heels with forward flexion  and lifting toes 1 x 10 reps. Forward stepping strategy B: 1 x 10 reps on R, 2 x 10 reps on L - with improvement in foot clearance.       Exercises   Other Exercises   Seated at edge of mat: reviewed seated hamstring stretch from previous HEP, pelvic tilts 1 x 10 reps with visual demonstration for technique, rolling out blue physioball for low back stretch with additions of rolling ball L + R for side stretch B - multiple reps          Access Code: [email or web address]  URL: [email or web address]  Date: 06/12/2019  Prepared by: Janann August   Exercises - verbally reviewed seated PWR moves (see PT exercise in plan) Proper Sit to Stand Technique - 5 reps - 3 sets - 1x daily - 7x weekly Seated Hamstring Stretch - 3 sets - 30 hold - 2x daily - 7x weekly  New additions to HEP:  Lower Trunk Rotations - 10 reps - 2 sets - 1x daily - 7x weekly  Seated Lumbar Flexion Stretch - 10 reps - 2 sets - 2x daily - 7x weekly Seated Pelvic Tilt - 10 reps - 2 sets - 1x daily - 7x weekly       PT Education - 06/12/19 1525    Education Details  additions to HEP - low back stretches    Person(s) Educated  Patient    Methods  Explanation;Demonstration;Handout    Comprehension  Verbalized understanding;Returned demonstration          PT Long Term Goals - 06/10/19 2137      PT LONG TERM GOAL #1   Title  Pt will be independent with HEP for improved transfers, gait, and trunk flexibility/decreased pain.  TARGET 06/28/2019    Time  4    Period  Weeks    Status  New      PT LONG TERM GOAL #2   Title  Pt will perform 5x sit<>stand to less than or equal to 12.5 seconds for improved functional lower extremity strength.    Time  4    Period  Weeks    Status  New      PT LONG TERM GOAL #3   Title  Pt will improve 6MWT distance to at least 630 ft indicating improved endurance for community distances.    Baseline  573 Feet    Time  4    Period  Weeks    Status  Revised      PT LONG TERM  GOAL #4   Title  Patient will  improve DGI score to a 22/24 in order to improve dynamic gait.    Baseline  20/24 on 06/10/19    Time  4    Period  Weeks    Status  Revised      PT LONG TERM GOAL #5   Title  Pt will verbalize understanding of local Parkinson's disease resources.    Time  4    Period  Weeks    Status  New            Plan - 06/12/19 1527    Clinical Impression Statement  Focus of today's skilled session was stretches for lumbar ROM, standing stepping strategy and weight shifting A/P for balance. Pt reporting decreased R shoulder discomfort this session, and reporting low back felt better at the end of the session. Pt with improvement in forward stepping strategy today with LLE and no UE support with multiple repetitions - demonstrated increased step length and foot clearance. Will continue to progress towards LTGs.    Personal Factors and Comorbidities  Comorbidity 3+    Comorbidities  See medical history in subjective (cardiac history)    Examination-Activity Limitations  Stand;Locomotion Level;Transfers    Examination-Participation Restrictions  Yard Work   Acitivites associated with raises puppies   Stability/Clinical Decision Making  Evolving/Moderate complexity    Rehab Potential  Good    PT Frequency  2x / week    PT Duration  4 weeks   plus eval   PT Treatment/Interventions  ADLs/Self Care Home Management;Gait training;Functional mobility training;Therapeutic activities;Therapeutic exercise;Balance training;DME Instruction;Neuromuscular re-education;Patient/family education    PT Next Visit Plan  how were new additions to HEP?  continue to progress PWR moves. - see if can progress with shoulder, standing to incorporate trunk flexibility/any stretches for low back, coordinated UE movements (as R shoulder allows) ; gait with arm swing, incr. L step length, stepping strategy balance.    PT Home Exercise Plan  [email or web address] - seated PWR moves  (PWR up and step!)     Consulted and Agree with Plan of Care  Patient       Patient will benefit from skilled therapeutic intervention in order to improve the following deficits and impairments:  Abnormal gait, Decreased coordination, Difficulty walking, Decreased strength, Decreased mobility, Postural dysfunction, Pain, Decreased balance  Visit Diagnosis: Other abnormalities of gait and mobility  Unsteadiness on feet  Muscle weakness (generalized)     Problem List Patient Active Problem List   Diagnosis Date Noted  . Fatigue 01/15/2019  . Chronic systolic (congestive) heart failure ([place]) 09/18/2018  . Ischemic cardiomyopathy 09/16/2018  . Aortic regurgitation 09/16/2018  . Cardiomyopathy, unspecified ([place]) 06/13/2018  . Abscess of right axilla 12/12/2017  . BMI 33.0-33.9,adult 12/12/2017  . 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  . Acute blood loss anemia 10/25/2017  . Acute postoperative respiratory insufficiency 10/25/2017  . Postoperative delirium 10/25/2017  . Dyslipidemia 10/17/2017  . SOB (shortness of breath) 03/31/2017  . Long term current use of aspirin 03/29/2017  . Parkinson's disease ([place]) 01/02/2017  . Morbid (severe) obesity due to excess calories ([place]) 10/27/2015  . Memory change 07/06/2015  . Depression 07/06/2015  . Medial meniscus tear 10/11/2011  . Neuroma of foot 10/11/2011  . Coronary artery disease involving native coronary artery of native heart with angina pectoris ([place]) 12/28/2010  . 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 Mixed hyperlipidemia 05/20/2010  . Hypertensive heart disease with heart failure ([place]) 05/20/2010  . 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  GERD 05/20/2010  . TRANSIENT ISCHEMIC ATTACK, HX OF 05/20/2010  . NEPHROLITHIASIS, HX OF 05/20/2010  . BENIGN PROSTATIC HYPERTROPHY, HX OF, S/P TURP 05/20/2010    Arliss Journey, PT, DPT  06/12/2019, 5:21 PM  [facility] [address] [place], [place], [address] Phone: [phone]   Fax:  [phone]  Name: Shawn Meza MRN: [id number] Date of Birth: 1946-07-22

## 2019-06-12 NOTE — Patient Instructions (Addendum)
  Access Code: [email or web address]  URL: [email or web address]  Date: 06/12/2019  Prepared by: Janann August   Exercises Proper Sit to Stand Technique - 5 reps - 3 sets - 1x daily - 7x weekly Seated Hamstring Stretch - 3 sets - 30 hold - 2x daily - 7x weekly Lower Trunk Rotations - 10 reps - 2 sets - 1x daily - 7x weekly Seated Lumbar Flexion Stretch - 10 reps - 2 sets - 2x daily - 7x weekly Seated Pelvic Tilt - 10 reps - 2 sets - 1x daily - 7x weekly

## 2019-06-19 ENCOUNTER — Other Ambulatory Visit: Payer: Self-pay

## 2019-06-19 ENCOUNTER — Ambulatory Visit: Payer: Medicare Other | Admitting: Physical Therapy

## 2019-06-19 ENCOUNTER — Encounter: Payer: Self-pay | Admitting: Physical Therapy

## 2019-06-19 DIAGNOSIS — R2689 Other abnormalities of gait and mobility: Secondary | ICD-10-CM | POA: Diagnosis not present

## 2019-06-19 DIAGNOSIS — M6281 Muscle weakness (generalized): Secondary | ICD-10-CM

## 2019-06-19 DIAGNOSIS — R2681 Unsteadiness on feet: Secondary | ICD-10-CM | POA: Diagnosis not present

## 2019-06-21 ENCOUNTER — Encounter: Payer: Self-pay | Admitting: Physical Therapy

## 2019-06-21 ENCOUNTER — Other Ambulatory Visit: Payer: Self-pay

## 2019-06-21 ENCOUNTER — Ambulatory Visit: Payer: Medicare Other | Admitting: Physical Therapy

## 2019-06-21 VITALS — BP 99/53 | HR 82

## 2019-06-21 DIAGNOSIS — R2689 Other abnormalities of gait and mobility: Secondary | ICD-10-CM | POA: Diagnosis not present

## 2019-06-21 DIAGNOSIS — R2681 Unsteadiness on feet: Secondary | ICD-10-CM

## 2019-06-21 DIAGNOSIS — M6281 Muscle weakness (generalized): Secondary | ICD-10-CM | POA: Diagnosis not present

## 2019-06-21 NOTE — Patient Instructions (Addendum)
Access Code: [email or web address]  URL: [email or web address]  Date: 06/21/2019  Prepared by: Mady Haagensen   Exercises Proper Sit to Stand Technique - 5 reps - 3 sets - 1x daily - 7x weekly Seated Hamstring Stretch - 3 sets - 30 hold - 2x daily - 7x weekly Lower Trunk Rotations - 10 reps - 2 sets - 1x daily - 7x weekly Seated Lumbar Flexion Stretch - 10 reps - 2 sets - 2x daily - 7x weekly Seated Pelvic Tilt - 10 reps - 2 sets - 1x daily - 7x weekly  06/21/2019 Staggered Stance Forward Backward Weight Shift with Counter Support - 10 reps - 1 sets - 1x daily - 5x weekly Side to side weightshift - 10 reps - 1 sets - 1x daily - 5x weekly Wrote in wide BOS standing at counter, lumbar flexion/extension x 10

## 2019-06-21 NOTE — Therapy (Signed)
[facility] [address] [place], [place], [address] Phone: [phone]   Fax:  [phone]  Physical Therapy Treatment  Patient Details  Name: Shawn Meza MRN: [id number] Date of Birth: [birth year] Referring Provider (PT): [name], Wells Guiles   Encounter Date: 06/19/2019  PT End of Session - 06/21/19 0520    Visit Number  6    Number of Visits  9    Date for PT Re-Evaluation  07/30/19    Authorization Type  UHC Medicare/Medicaid    PT Start Time  [id number]    PT Stop Time  1832    PT Time Calculation (min)  40 min    Equipment Utilized During Treatment  Gait belt    Activity Tolerance  Patient tolerated treatment well   reports pain as 2/10 at end of session   Behavior During Therapy  [facility] for tasks assessed/performed       Past Medical History:  Diagnosis Date  . Arthritis   . BPH (benign prostatic hypertrophy)   . Chronic coronary artery disease   . Colon cancer ([place])   . Elevated PSA   . Erectile dysfunction   . Essential hypertension   . Essential tremor   . GERD (gastroesophageal reflux disease)   . Headache(784.0)   . Hiatal hernia   . Hypercholesterolemia   . Long-term use of aspirin therapy   . Major depression, chronic   . Medial meniscus tear 10/11/2011  . Metabolic syndrome   . Morbid obesity ([place])   . Nephrolithiasis    hx of  . NSTEMI (non-ST elevated myocardial infarction) ([place])   . Parkinson's disease ([place])   . S/P CABG (coronary artery bypass graft)   . Transient ischemic attack    hx of  . Trochanteric bursitis of right hip     Past Surgical History:  Procedure Laterality Date  . CARDIAC CATHETERIZATION  5/12,1/13   4 stents placed  . COLON SURGERY    . CORONARY ARTERY BYPASS GRAFT    . KNEE ARTHROSCOPY  10/11/2011   Procedure: ARTHROSCOPY KNEE;  Surgeon: Lorn Junes, MD;  Location: [facility];  Service: Orthopedics;  Laterality: Left;  Left Knee Arthroscopy with  Medial and Lateral Partial Menisectomy, Chondroplasty  . LEFT HEART CATHETERIZATION WITH CORONARY ANGIOGRAM N/A 08/25/2011   Procedure: LEFT HEART CATHETERIZATION WITH CORONARY ANGIOGRAM;  Surgeon: Burnell Blanks, MD;  Location: [facility] CATH LAB;  Service: Cardiovascular;  Laterality: N/A;  . LITHOTRIPSY    . STERIOD INJECTION  10/11/2011   Procedure: STEROID INJECTION;  Surgeon: Lorn Junes, MD;  Location: [facility];  Service: Orthopedics;  Laterality: Right;  Steroid Injection Second Toe  . TRANSURETHRAL RESECTION OF PROSTATE    . URETHRAL DILATION      There were no vitals filed for this visit.  Subjective Assessment - 06/21/19 0515    Subjective  Never got a call from the surgeon regarding my back pain.    Pertinent History  hospitalized 2019 s/p CABG x 5, ischemic cardiomyopathy,CHF, Parkinson's disease x 4 years    Patient Stated Goals  Pt's goal for therapy is to be able to do things without pain and being worn out.    Currently in Pain?  Yes    Pain Score  5     Pain Location  Hip   low back   Pain Orientation  Lower;Left    Pain Descriptors / Indicators  Aching   catching  Pain Type  Acute pain    Pain Onset  In the past 7 days    Aggravating Factors   moving, walking    Pain Relieving Factors  not moving    Pain Onset  More than a month ago    Pain Onset  More than a month ago                       [facility] Adult PT Treatment/Exercise - 06/21/19 0517      Self-Care   Self-Care  Other Self-Care Comments    Other Self-Care Comments   Discussed medication management in regarding to Parkinson's medications , given pt's ongoing c/o stiffness/tremors (noted today in therapy session).  He reports taking 3 Sinemet per day, but not on a consistent schedule.  He reports taking Sinemet with meals.  Explained importance with PD meds to take medications ontime, every time.  Discussed interaction with protein and Sinemet; pt fearful of nausea if he  doesn't eat.  Provided ifnoramtion from [employer] on reasoning behind interaction of protein and Sinemet.  Suggested crackers, fruit or small snack that he might take iwth medication, to try to avoid taking it with high protein meals, to see if he notes improvement in symptoms with more specific medicaiton management.      Exercises   Exercises  Lumbar      Lumbar Exercises: Stretches   Active Hamstring Stretch  Right;Left;3 reps;30 seconds    Active Hamstring Stretch Limitations  SEated hamstring stretch-cues for correct technique    Single Knee to Chest Stretch  5 reps;10 seconds   then additional 5 reps, rocking motion   Single Knee to Chest Stretch Limitations  Used belt to assist to bring knee to chest    Lower Trunk Rotation  3 reps;30 seconds      Lumbar Exercises: Standing   Other Standing Lumbar Exercises  Wide BOS lateral weigthshfiting at counter with UE support x 10 reps, staggered stance position anterior/posterior weigthshifting x 10 reps each leg, then lateral weightshifting with reach across body along counter, x 10 reps (to encourage gentle trunk rotation).  Explained gentle, fluid movements, repeated may help reduce stiffness and reduce back pain.  Disucssed using these positions for standing activities in home to avoid back pain with prolonged standing.      Lumbar Exercises: Seated   Other Seated Lumbar Exercises  Seated forward flexion (walking fingers to toes) x 5 reps with minimal c/o L back pain upon coming into extension/upright posture.  PRacticed forward flexion to knees>upright sitting posture x 5 reps, with pain upon returning through extension to upright posture.    Other Seated Lumbar Exercises  Seated anterior/posterior pelvic tilts x 10 reps edge of mat.  Minimal to no c/o pain.      Lumbar Exercises: Supine   Other Supine Lumbar Exercises  With knees flexed, feet flat:  attempted PWR! UP position through shoulders (scapular squeezed towards mat) x 5  reps (pt c/o low back stiffening up with this exercise, so did not proceed to [facility]! Rock stretching side to side)             PT Education - 06/21/19 0519    Education Details  Information on PD medication management (provided some info from [employer])    Person(s) Educated  Patient    Methods  Explanation;Handout    Comprehension  Verbalized understanding          PT Long Term  Goals - 06/10/19 2137      PT LONG TERM GOAL #1   Title  Pt will be independent with HEP for improved transfers, gait, and trunk flexibility/decreased pain.  TARGET 06/28/2019    Time  4    Period  Weeks    Status  New      PT LONG TERM GOAL #2   Title  Pt will perform 5x sit<>stand to less than or equal to 12.5 seconds for improved functional lower extremity strength.    Time  4    Period  Weeks    Status  New      PT LONG TERM GOAL #3   Title  Pt will improve 6MWT distance to at least 630 ft indicating improved endurance for community distances.    Baseline  573 Feet    Time  4    Period  Weeks    Status  Revised      PT LONG TERM GOAL #4   Title  Patient will improve DGI score to a 22/24 in order to improve dynamic gait.    Baseline  20/24 on 06/10/19    Time  4    Period  Weeks    Status  Revised      PT LONG TERM GOAL #5   Title  Pt will verbalize understanding of local Parkinson's disease resources.    Time  4    Period  Weeks    Status  New            Plan - 06/21/19 0521    Clinical Impression Statement  Supine and sitting as well as standing weigthshfiting, gentle ROM and stretches; worked on repeated motions when pt reports no increase in pain (noted that seated motion from trunk flexion>upright posture aggravates specific area of L low back, but overall pt decreased pain rating to 2/10 at end of session.  Will conitnue to work towards [place].    Personal Factors and Comorbidities  Comorbidity 3+    Comorbidities  See medical history in subjective (cardiac  history)    Examination-Activity Limitations  Stand;Locomotion Level;Transfers    Examination-Participation Restrictions  Yard Work   Acitivites associated with raises puppies   Stability/Clinical Decision Making  Evolving/Moderate complexity    Rehab Potential  Good    PT Frequency  2x / week    PT Duration  4 weeks   plus eval   PT Treatment/Interventions  ADLs/Self Care Home Management;Gait training;Functional mobility training;Therapeutic activities;Therapeutic exercise;Balance training;DME Instruction;Neuromuscular re-education;Patient/family education    PT Next Visit Plan  Standing weigthshfiting, stepping strategy; gait with arm swing, trunk rotation in corner if able    PT Home Exercise Plan  [id number] - seated PWR moves  (PWR up and step!)    Consulted and Agree with Plan of Care  Patient       Patient will benefit from skilled therapeutic intervention in order to improve the following deficits and impairments:  Abnormal gait, Decreased coordination, Difficulty walking, Decreased strength, Decreased mobility, Postural dysfunction, Pain, Decreased balance  Visit Diagnosis: Unsteadiness on feet  Muscle weakness (generalized)  Other abnormalities of gait and mobility     Problem List Patient Active Problem List   Diagnosis Date Noted  . Fatigue 01/15/2019  . Chronic systolic (congestive) heart failure ([place]) 09/18/2018  . Ischemic cardiomyopathy 09/16/2018  . Aortic regurgitation 09/16/2018  . Cardiomyopathy, unspecified ([place]) 06/13/2018  . Abscess of right axilla 12/12/2017  . BMI 33.0-33.9,adult 12/12/2017  . S/P CABG (  coronary artery bypass graft) 11/23/2017  . Acute blood loss anemia 10/25/2017  . Acute postoperative respiratory insufficiency 10/25/2017  . Postoperative delirium 10/25/2017  . Dyslipidemia 10/17/2017  . SOB (shortness of breath) 03/31/2017  . Long term current use of aspirin 03/29/2017  . Parkinson's disease ([place]) 01/02/2017  . Morbid (severe)  obesity due to excess calories ([place]) 10/27/2015  . Memory change 07/06/2015  . Depression 07/06/2015  . Medial meniscus tear 10/11/2011  . Neuroma of foot 10/11/2011  . Coronary artery disease involving native coronary artery of native heart with angina pectoris ([place]) 12/28/2010  . 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 Mixed hyperlipidemia 05/20/2010  . Hypertensive heart disease with heart failure ([place]) 05/20/2010  . 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 GERD 05/20/2010  . TRANSIENT ISCHEMIC ATTACK, HX OF 05/20/2010  . NEPHROLITHIASIS, HX OF 05/20/2010  . BENIGN PROSTATIC HYPERTROPHY, HX OF, S/P TURP 05/20/2010    MARRIOTT,AMY W. 06/21/2019, 5:24 AM  Frazier Butt., PT   [facility] [address] [place], [place], [address] Phone: [phone]   Fax:  [phone]  Name: NEHEMIA LENZI MRN: [id number] Date of Birth: 04-16-1946

## 2019-06-23 ENCOUNTER — Encounter: Payer: Self-pay | Admitting: Physical Therapy

## 2019-06-23 NOTE — Therapy (Signed)
[facility] [address] [place], [place], [address] Phone: [phone]   Fax:  [phone]  Physical Therapy Treatment  Patient Details  Name: Shawn Meza MRN: [id number] Date of Birth: [birth year] Referring Provider (PT): [name], Wells Guiles   Encounter Date: 06/21/2019  PT End of Session - 06/23/19 1952    Visit Number  7    Number of Visits  9    Date for PT Re-Evaluation  07/30/19    Authorization Type  UHC Medicare/Medicaid    PT Start Time  1228    PT Stop Time  1313    PT Time Calculation (min)  45 min    Equipment Utilized During Treatment  Gait belt    Activity Tolerance  Patient tolerated treatment well   reports pain as 2/10 at end of session   Behavior During Therapy  [facility] for tasks assessed/performed       Past Medical History:  Diagnosis Date  . Arthritis   . BPH (benign prostatic hypertrophy)   . Chronic coronary artery disease   . Colon cancer ([place])   . Elevated PSA   . Erectile dysfunction   . Essential hypertension   . Essential tremor   . GERD (gastroesophageal reflux disease)   . Headache(784.0)   . Hiatal hernia   . Hypercholesterolemia   . Long-term use of aspirin therapy   . Major depression, chronic   . Medial meniscus tear 10/11/2011  . Metabolic syndrome   . Morbid obesity ([place])   . Nephrolithiasis    hx of  . NSTEMI (non-ST elevated myocardial infarction) ([place])   . Parkinson's disease ([place])   . S/P CABG (coronary artery bypass graft)   . Transient ischemic attack    hx of  . Trochanteric bursitis of right hip     Past Surgical History:  Procedure Laterality Date  . CARDIAC CATHETERIZATION  5/12,1/13   4 stents placed  . COLON SURGERY    . CORONARY ARTERY BYPASS GRAFT    . KNEE ARTHROSCOPY  10/11/2011   Procedure: ARTHROSCOPY KNEE;  Surgeon: Lorn Junes, MD;  Location: [facility];  Service: Orthopedics;  Laterality: Left;  Left Knee Arthroscopy with  Medial and Lateral Partial Menisectomy, Chondroplasty  . LEFT HEART CATHETERIZATION WITH CORONARY ANGIOGRAM N/A 08/25/2011   Procedure: LEFT HEART CATHETERIZATION WITH CORONARY ANGIOGRAM;  Surgeon: Burnell Blanks, MD;  Location: [facility] CATH LAB;  Service: Cardiovascular;  Laterality: N/A;  . LITHOTRIPSY    . STERIOD INJECTION  10/11/2011   Procedure: STEROID INJECTION;  Surgeon: Lorn Junes, MD;  Location: [facility];  Service: Orthopedics;  Laterality: Right;  Steroid Injection Second Toe  . TRANSURETHRAL RESECTION OF PROSTATE    . URETHRAL DILATION      Vitals:   06/21/19 1233 06/21/19 1238  BP: (!) 102/41 (!) 99/53  Pulse: 82     Subjective Assessment - 06/23/19 1943    Subjective  I'm a little sore from the exercises yesterday.  Tried to do what you told me about the medication, but today I felt a little funny.    Pertinent History  hospitalized 2019 s/p CABG x 5, ischemic cardiomyopathy,CHF, Parkinson's disease x 4 years    Patient Stated Goals  Pt's goal for therapy is to be able to do things without pain and being worn out.    Currently in Pain?  Yes    Pain Score  5     Pain Location  Hip    Pain Orientation  Right;Lower    Pain Descriptors / Indicators  Aching   catching   Pain Type  Acute pain    Pain Onset  In the past 7 days    Pain Frequency  Intermittent    Aggravating Factors   moving, walking    Pain Relieving Factors  not moving    Pain Onset  More than a month ago    Pain Onset  More than a month ago                       [facility] Adult PT Treatment/Exercise - 06/23/19 1944      Ambulation/Gait   Ambulation/Gait  Yes    Ambulation/Gait Assistance  5: Supervision    Ambulation/Gait Assistance Details  Cues for increased step length and arm swing for first bout of gait; second bout of gait, utilized bilateral walking poles to facilitate increased arm swing and step length    Ambulation Distance (Feet)  230 Feet   then  additional 230 ft x 2   Assistive device  None    Gait Pattern  Step-through pattern;Decreased arm swing - left;Decreased step length - left;Decreased stance time - left;Decreased trunk rotation;Poor foot clearance - left;Decreased dorsiflexion - left   Improved arm swing, step length with cues   Ambulation Surface  Level;Indoor    Gait Comments  After first bout of gait, took a standing break to perform set of lateral wegithshifting, anterior/posterior weigthshifting and stagger stance weightshifting at counter (as a means to decrease back pain).      Self-Care   Self-Care  Other Self-Care Comments    Other Self-Care Comments   Reviewed pt's questions regarding timing of Parkinson's medications.  Referred further questions regarding this to Dr. Carles Collet.        Lumbar Exercises: Standing   Other Standing Lumbar Exercises  Wide BOS lateral weigthshfiting at counter with UE support x 10 reps, staggered stance position anterior/posterior weigthshifting x 10 reps each leg, then lateral weightshifting with reach across body along counter, x 10 reps (to encourage gentle trunk rotation).  Also performed wide BOS lumbar flexion/extension gentle stetch, x 10 reps.  Performed one set initially at counter; then second additional set performed after gait-see above. Explained gentle, fluid movements, repeated may help reduce stiffness and reduce back pain.  Disucssed using these positions for standing activities in home to avoid back pain with prolonged standing as well as strategy to stretch when he feels stiffness coming on with longer bouts of gait.             PT Education - 06/23/19 1952    Education Details  Updates to HEP-see instructions    Person(s) Educated  Patient    Methods  Explanation;Demonstration;Handout    Comprehension  Verbalized understanding;Returned demonstration;Verbal cues required          PT Long Term Goals - 06/10/19 2137      PT LONG TERM GOAL #1   Title  Pt will be  independent with HEP for improved transfers, gait, and trunk flexibility/decreased pain.  TARGET 06/28/2019    Time  4    Period  Weeks    Status  New      PT LONG TERM GOAL #2   Title  Pt will perform 5x sit<>stand to less than or equal to 12.5 seconds for improved functional lower extremity strength.    Time  4    Period  Weeks    Status  New      PT LONG TERM GOAL #3   Title  Pt will improve 6MWT distance to at least 630 ft indicating improved endurance for community distances.    Baseline  573 Feet    Time  4    Period  Weeks    Status  Revised      PT LONG TERM GOAL #4   Title  Patient will improve DGI score to a 22/24 in order to improve dynamic gait.    Baseline  20/24 on 06/10/19    Time  4    Period  Weeks    Status  Revised      PT LONG TERM GOAL #5   Title  Pt will verbalize understanding of local Parkinson's disease resources.    Time  4    Period  Weeks    Status  New            Plan - 06/23/19 1953    Clinical Impression Statement  Continued to work on standing weigthshfiting activities, as a means of general increased movement, but also as strategy to avoid excess pain with longer bouts of gait.  Pt responds well to cues throughout session with short and longer distance gait for improved arm swing and step length, and seems to be pleased with how he is moving.  Pt will continue to benefit from skilled PT to address strength, posture, gait, overall flexibility, mobility.    Personal Factors and Comorbidities  Comorbidity 3+    Comorbidities  See medical history in subjective (cardiac history)    Examination-Activity Limitations  Stand;Locomotion Level;Transfers    Examination-Participation Restrictions  Yard Work   Acitivites associated with raises puppies   Stability/Clinical Decision Making  Evolving/Moderate complexity    Rehab Potential  Good    PT Frequency  2x / week    PT Duration  4 weeks   plus eval   PT Treatment/Interventions  ADLs/Self Care  Home Management;Gait training;Functional mobility training;Therapeutic activities;Therapeutic exercise;Balance training;DME Instruction;Neuromuscular re-education;Patient/family education    PT Next Visit Plan  Review updates to HEP; begin checking LTGs and discuss POC    PT Home Exercise Plan  [id number] - seated PWR moves  (PWR up and step!)    Consulted and Agree with Plan of Care  Patient       Patient will benefit from skilled therapeutic intervention in order to improve the following deficits and impairments:  Abnormal gait, Decreased coordination, Difficulty walking, Decreased strength, Decreased mobility, Postural dysfunction, Pain, Decreased balance  Visit Diagnosis: Unsteadiness on feet  Other abnormalities of gait and mobility     Problem List Patient Active Problem List   Diagnosis Date Noted  . Fatigue 01/15/2019  . Chronic systolic (congestive) heart failure ([place]) 09/18/2018  . Ischemic cardiomyopathy 09/16/2018  . Aortic regurgitation 09/16/2018  . Cardiomyopathy, unspecified ([place]) 06/13/2018  . Abscess of right axilla 12/12/2017  . BMI 33.0-33.9,adult 12/12/2017  . 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  . Acute blood loss anemia 10/25/2017  . Acute postoperative respiratory insufficiency 10/25/2017  . Postoperative delirium 10/25/2017  . Dyslipidemia 10/17/2017  . SOB (shortness of breath) 03/31/2017  . Long term current use of aspirin 03/29/2017  . Parkinson's disease ([place]) 01/02/2017  . Morbid (severe) obesity due to excess calories ([place]) 10/27/2015  . Memory change 07/06/2015  . Depression 07/06/2015  . Medial meniscus tear 10/11/2011  . Neuroma of foot 10/11/2011  . Coronary artery disease  involving native coronary artery of native heart with angina pectoris ([place]) 12/28/2010  . 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 Mixed hyperlipidemia 05/20/2010  . 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  . ALLERGIC RHINITIS DUE  TO OTHER ALLERGEN 05/20/2010  . GERD 05/20/2010  . TRANSIENT ISCHEMIC ATTACK, HX OF 05/20/2010  . NEPHROLITHIASIS, HX OF 05/20/2010  . BENIGN PROSTATIC HYPERTROPHY, HX OF, S/P TURP 05/20/2010    Sophiya Morello W. 06/23/2019, 7:57 PM  Frazier Butt., PT   [facility] [address] [place], [place], [address] Phone: [phone]   Fax:  [phone]  Name: AADVIK HOERL MRN: [id number] Date of Birth: September 14, 1945

## 2019-06-24 ENCOUNTER — Ambulatory Visit: Payer: Medicare Other | Admitting: Physical Therapy

## 2019-06-26 ENCOUNTER — Ambulatory Visit: Payer: Medicare Other | Admitting: Physical Therapy

## 2019-07-18 ENCOUNTER — Ambulatory Visit: Payer: Medicare Other | Attending: Neurology | Admitting: Physical Therapy

## 2019-07-18 ENCOUNTER — Other Ambulatory Visit: Payer: Self-pay

## 2019-07-18 DIAGNOSIS — R2689 Other abnormalities of gait and mobility: Secondary | ICD-10-CM

## 2019-07-18 DIAGNOSIS — R2681 Unsteadiness on feet: Secondary | ICD-10-CM

## 2019-07-18 DIAGNOSIS — M6281 Muscle weakness (generalized): Secondary | ICD-10-CM | POA: Diagnosis not present

## 2019-07-19 ENCOUNTER — Encounter: Payer: Self-pay | Admitting: Physical Therapy

## 2019-07-19 NOTE — Therapy (Signed)
[facility] [address] [place], [place], [address] Phone: [phone]   Fax:  [phone]  Physical Therapy Treatment  Patient Details  Name: Shawn Meza MRN: [id number] Date of Birth: 10-16-1945 Referring Provider (PT): [name], Wells Guiles   Encounter Date: 07/18/2019  PT End of Session - 07/19/19 1620    Visit Number  8    Number of Visits  9    Date for PT Re-Evaluation  07/30/19    Authorization Type  UHC Medicare/Medicaid    PT Start Time  1402    PT Stop Time  1447    PT Time Calculation (min)  45 min    Equipment Utilized During Treatment  --    Activity Tolerance  Patient tolerated treatment well   reports pain as 2/10 at end of session   Behavior During Therapy  [facility] for tasks assessed/performed       Past Medical History:  Diagnosis Date  . Arthritis   . BPH (benign prostatic hypertrophy)   . Chronic coronary artery disease   . Colon cancer ([place])   . Elevated PSA   . Erectile dysfunction   . Essential hypertension   . Essential tremor   . GERD (gastroesophageal reflux disease)   . Headache(784.0)   . Hiatal hernia   . Hypercholesterolemia   . Long-term use of aspirin therapy   . Major depression, chronic   . Medial meniscus tear 10/11/2011  . Metabolic syndrome   . Morbid obesity ([place])   . Nephrolithiasis    hx of  . NSTEMI (non-ST elevated myocardial infarction) ([place])   . Parkinson's disease ([place])   . S/P CABG (coronary artery bypass graft)   . Transient ischemic attack    hx of  . Trochanteric bursitis of right hip     Past Surgical History:  Procedure Laterality Date  . CARDIAC CATHETERIZATION  5/12,1/13   4 stents placed  . COLON SURGERY    . CORONARY ARTERY BYPASS GRAFT    . KNEE ARTHROSCOPY  10/11/2011   Procedure: ARTHROSCOPY KNEE;  Surgeon: Lorn Junes, MD;  Location: [facility];  Service: Orthopedics;  Laterality: Left;  Left Knee Arthroscopy with Medial and  Lateral Partial Menisectomy, Chondroplasty  . LEFT HEART CATHETERIZATION WITH CORONARY ANGIOGRAM N/A 08/25/2011   Procedure: LEFT HEART CATHETERIZATION WITH CORONARY ANGIOGRAM;  Surgeon: Burnell Blanks, MD;  Location: [facility] CATH LAB;  Service: Cardiovascular;  Laterality: N/A;  . LITHOTRIPSY    . STERIOD INJECTION  10/11/2011   Procedure: STEROID INJECTION;  Surgeon: Lorn Junes, MD;  Location: [facility];  Service: Orthopedics;  Laterality: Right;  Steroid Injection Second Toe  . TRANSURETHRAL RESECTION OF PROSTATE    . URETHRAL DILATION      There were no vitals filed for this visit.  Subjective Assessment - 07/19/19 1607    Subjective  Pt reports he has not had therapy in several weeks due to staff being out and lack of openings.  Feels like he is not doing as well since last visit.  States his brother passed away this week.  Still having some R shoulder pain with certain movements.  Also states he has not heard anything from Neurosurgeon regarding appointment for consultation (Dr [name] refered to Dr Vertell Limber).    Pertinent History  hospitalized 2019 s/p CABG x 5, ischemic cardiomyopathy,CHF, Parkinson's disease x 4 years    Patient Stated Goals  Pt's goal for therapy is to be  able to do things without pain and being worn out.    Currently in Pain?  No/denies   none at present but r shoulder discomfort with certain movements along with lack of full r shoulder ROM   Pain Onset  In the past 7 days    Pain Onset  More than a month ago    Pain Onset  More than a month ago         [facility] PT Assessment - 07/19/19 0001      ROM / Strength   AROM / PROM / Strength  Strength      Strength   Overall Strength  Deficits    Strength Assessment Site  Hip;Knee;Ankle    Right/Left Hip  Left;Right    Right Hip Flexion  5/5    Left Hip Flexion  4-/5    Right/Left Knee  Right;Left    Right Knee Flexion  5/5    Right Knee Extension  5/5    Left Knee Flexion  4+/5    Left Knee  Extension  4-/5    Right/Left Ankle  Left    Left Ankle Dorsiflexion  4-/5                   [facility] Adult PT Treatment/Exercise - 07/19/19 0001      Transfers   Transfers  Sit to Stand;Stand to Sit    Sit to Stand  5: Supervision;Without upper extremity assist    Five time sit to stand comments   13.25 seconds    Stand to Sit  5: Supervision;Without upper extremity assist      Ambulation/Gait   Ambulation/Gait  Yes    Ambulation/Gait Assistance  5: Supervision    Ambulation/Gait Assistance Details  cues for increased arm swing.  Gait speed improved with armswing.  Pt states his balance feels better when his speed increases slightly.    Ambulation Distance (Feet)  115 Feet   x 4 and through out gym   Assistive device  None    Gait Pattern  Step-through pattern;Decreased arm swing - left;Decreased step length - left;Decreased stance time - left;Decreased trunk rotation;Poor foot clearance - left;Decreased dorsiflexion - left    Ambulation Surface  Level;Indoor    Gait velocity  3.12 ft/sec = 10.53 sec      Standardized Balance Assessment   Standardized Balance Assessment  Timed Up and Go Test      Timed Up and Go Test   TUG  Normal TUG    Normal TUG (seconds)  10.85      Self-Care   Self-Care  Other Self-Care Comments    Other Self-Care Comments   Discussed pt's progress toward goals.  Pt concerned over feeling like he is not moving as well due to gap since last therapy session and feels like he was not as active as he should have been.  Still having back pain and LLE weakness.  Has not heard from Dr Donald Pore office regarding an appointment. Discussed for pt to call Dr Arturo Morton office or Dr Donald Pore office to follow up on the referral/appointment.  Pt reports having difficulty picking up his L leg at times when he's sitting in his recliner.  Pt also continues to have redness and irritation in L eye and was supposed to have an appointment with opthamologist? but it was cancelled by  MD office and he has not received another appointment.  Recommended for pt to call that office back as well to follow  up.             PT Education - 07/19/19 1619    Education Details  Progress toward goals, pt to follow up with Dr [name] [id number] appointment, pt to follow up with opthamologist concerning appointment    Person(s) Educated  Patient    Methods  Explanation    Comprehension  Verbalized understanding          PT Long Term Goals - 07/19/19 1621      PT LONG TERM GOAL #1   Title  Pt will be independent with HEP for improved transfers, gait, and trunk flexibility/decreased pain.  TARGET 06/28/2019    Time  4    Period  Weeks    Status  On-going      PT LONG TERM GOAL #2   Title  Pt will perform 5x sit<>stand to less than or equal to 12.5 seconds for improved functional lower extremity strength.    Baseline  13.25 on 07/19/19    Time  4    Period  Weeks    Status  Not Met      PT LONG TERM GOAL #3   Title  Pt will improve 6MWT distance to at least 630 ft indicating improved endurance for community distances.    Baseline  573 Feet    Time  4    Period  Weeks    Status  Revised      PT LONG TERM GOAL #4   Title  Patient will improve DGI score to a 22/24 in order to improve dynamic gait.    Baseline  20/24 on 06/10/19    Time  4    Period  Weeks    Status  Revised      PT LONG TERM GOAL #5   Title  Pt will verbalize understanding of local Parkinson's disease resources.    Time  4    Period  Weeks    Status  On-going            Plan - 07/19/19 1622    Clinical Impression Statement  Began checking LTG's.  Pt did not meet LTG #2.  LTG's 1 and 5 ongoing.  Will check remaining goals next visit and renewal by Mady Haagensen, PT.  Pt reporting difficulty "lifting left leg off recliner" and has pins/needles at times.  LLE hip flexion 4-/5 today.  Continue PT per POC.    Personal Factors and Comorbidities  Comorbidity 3+    Comorbidities  See medical  history in subjective (cardiac history)    Examination-Activity Limitations  Stand;Locomotion Level;Transfers    Examination-Participation Restrictions  Yard Work   Acitivites associated with raises puppies   Stability/Clinical Decision Making  Evolving/Moderate complexity    Rehab Potential  Good    PT Frequency  2x / week    PT Duration  4 weeks   plus eval   PT Treatment/Interventions  ADLs/Self Care Home Management;Gait training;Functional mobility training;Therapeutic activities;Therapeutic exercise;Balance training;DME Instruction;Neuromuscular re-education;Patient/family education    PT Next Visit Plan  Finish checking goals and renewal by Mady Haagensen, [place]  [id number] - seated PWR moves  (PWR up and step!)    Consulted and Agree with Plan of Care  Patient       Patient will benefit from skilled therapeutic intervention in order to improve the following deficits and impairments:  Abnormal gait, Decreased coordination, Difficulty walking, Decreased strength, Decreased mobility, Postural dysfunction, Pain, Decreased balance  Visit Diagnosis: Unsteadiness on feet  Other abnormalities of gait and mobility  Muscle weakness (generalized)     Problem List Patient Active Problem List   Diagnosis Date Noted  . Fatigue 01/15/2019  . Chronic systolic (congestive) heart failure ([place]) 09/18/2018  . Ischemic cardiomyopathy 09/16/2018  . Aortic regurgitation 09/16/2018  . Cardiomyopathy, unspecified ([place]) 06/13/2018  . Abscess of right axilla 12/12/2017  . BMI 33.0-33.9,adult 12/12/2017  . 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  . Acute blood loss anemia 10/25/2017  . Acute postoperative respiratory insufficiency 10/25/2017  . Postoperative delirium 10/25/2017  . Dyslipidemia 10/17/2017  . SOB (shortness of breath) 03/31/2017  . Long term current use of aspirin 03/29/2017  . Parkinson's disease ([place]) 01/02/2017  . Morbid (severe) obesity due to  excess calories ([place]) 10/27/2015  . Memory change 07/06/2015  . Depression 07/06/2015  . Medial meniscus tear 10/11/2011  . Neuroma of foot 10/11/2011  . Coronary artery disease involving native coronary artery of native heart with angina pectoris ([place]) 12/28/2010  . 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 Mixed hyperlipidemia 05/20/2010  . Hypertensive heart disease with heart failure ([place]) 05/20/2010  . 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 GERD 05/20/2010  . TRANSIENT ISCHEMIC ATTACK, HX OF 05/20/2010  . NEPHROLITHIASIS, HX OF 05/20/2010  . BENIGN PROSTATIC HYPERTROPHY, HX OF, S/P TURP 05/20/2010   Narda Bonds, PTA [facility] 07/19/19 4:26 PM Phone: [phone] Fax: [place] [facility] [address] [place], [place], [address] Phone: [phone]   Fax:  [phone]  Name: CANE DUBRAY MRN: [id number] Date of Birth: 07-26-1946

## 2019-07-23 ENCOUNTER — Ambulatory Visit: Payer: Medicare Other | Admitting: Physical Therapy

## 2019-07-24 ENCOUNTER — Ambulatory Visit: Payer: Medicare Other | Admitting: Physical Therapy

## 2019-07-29 ENCOUNTER — Other Ambulatory Visit: Payer: Self-pay

## 2019-07-29 ENCOUNTER — Ambulatory Visit: Payer: Medicare Other | Admitting: Physical Therapy

## 2019-07-29 DIAGNOSIS — M6281 Muscle weakness (generalized): Secondary | ICD-10-CM

## 2019-07-29 DIAGNOSIS — R2689 Other abnormalities of gait and mobility: Secondary | ICD-10-CM | POA: Diagnosis not present

## 2019-07-29 DIAGNOSIS — R2681 Unsteadiness on feet: Secondary | ICD-10-CM | POA: Diagnosis not present

## 2019-07-29 NOTE — Patient Instructions (Signed)
Walking Program  Start with taking a 5 minute walk every day (weather permitting). After 1 week, increase your walking time to 6 minutes. Add a minute every week until you are walking 20 minutes

## 2019-07-29 NOTE — Therapy (Addendum)
[facility] [address] [place], [place], [address] Phone: [phone]   Fax:  [phone]  Physical Therapy Treatment  Patient Details  Name: Shawn Meza MRN: [id number] Date of Birth: 08/23/1945 Referring Provider (PT): [name], Rebecca   Encounter Date: 07/29/2019  PT End of Session - 07/29/19 1430    Visit Number  9    Number of Visits  9    Date for PT Re-Evaluation  07/30/19    Authorization Type  UHC Medicare/Medicaid    PT Start Time  1318    PT Stop Time  1408    PT Time Calculation (min)  50 min    Activity Tolerance  Patient tolerated treatment well   reports pain as 2/10 at end of session   Behavior During Therapy  WFL for tasks assessed/performed       Past Medical History:  Diagnosis Date  . Arthritis   . BPH (benign prostatic hypertrophy)   . Chronic coronary artery disease   . Colon cancer ([facility])   . Elevated PSA   . Erectile dysfunction   . Essential hypertension   . Essential tremor   . GERD (gastroesophageal reflux disease)   . Headache(784.0)   . Hiatal hernia   . Hypercholesterolemia   . Long-term use of aspirin therapy   . Major depression, chronic   . Medial meniscus tear 10/11/2011  . Metabolic syndrome   . Morbid obesity ([facility])   . Nephrolithiasis    hx of  . NSTEMI (non-ST elevated myocardial infarction) ([facility])   . Parkinson's disease ([facility])   . S/P CABG (coronary artery bypass graft)   . Transient ischemic attack    hx of  . Trochanteric bursitis of right hip     Past Surgical History:  Procedure Laterality Date  . CARDIAC CATHETERIZATION  5/12,1/13   4 stents placed  . COLON SURGERY    . CORONARY ARTERY BYPASS GRAFT    . KNEE ARTHROSCOPY  10/11/2011   Procedure: ARTHROSCOPY KNEE;  Surgeon: Robert A Wainer, MD;  Location: [facility];  Service: Orthopedics;  Laterality: Left;  Left Knee Arthroscopy with Medial and Lateral Partial Menisectomy, Chondroplasty   . LEFT HEART CATHETERIZATION WITH CORONARY ANGIOGRAM N/A 08/25/2011   Procedure: LEFT HEART CATHETERIZATION WITH CORONARY ANGIOGRAM;  Surgeon: Christopher D McAlhany, MD;  Location: [facility] CATH LAB;  Service: Cardiovascular;  Laterality: N/A;  . LITHOTRIPSY    . STERIOD INJECTION  10/11/2011   Procedure: STEROID INJECTION;  Surgeon: Robert A Wainer, MD;  Location: [facility];  Service: Orthopedics;  Laterality: Right;  Steroid Injection Second Toe  . TRANSURETHRAL RESECTION OF PROSTATE    . URETHRAL DILATION      There were no vitals filed for this visit.  Subjective Assessment - 07/29/19 1320    Subjective  Was out last week due to death in family.  Denies falls or changes other than feels like hes not moving as well.    Pertinent History  hospitalized 2019 s/p CABG x 5, ischemic cardiomyopathy,CHF, Parkinson's disease x 4 years    Patient Stated Goals  Pt's goal for therapy is to be able to do things without pain and being worn out.    Currently in Pain?  Yes    Pain Score  5     Pain Location  Back    Pain Orientation  Left    Pain Descriptors / Indicators  Cramping    Pain Type    Chronic pain    Pain Onset  More than a month ago    Pain Frequency  Intermittent    Pain Onset  More than a month ago    Pain Onset  More than a month ago       [facility] Adult PT Treatment/Exercise - 07/29/19 0001      Transfers   Transfers  Sit to Stand;Stand to Sit    Sit to Stand  5: Supervision;Without upper extremity assist    Stand to Sit  5: Supervision;Without upper extremity assist      Ambulation/Gait   Ambulation/Gait  Yes    Ambulation/Gait Assistance  5: Supervision    Ambulation/Gait Assistance Details  cues for increased step length on L    Ambulation Distance (Feet)  670 Feet   plus 115 x 2   Assistive device  None    Gait Pattern  Step-through pattern;Decreased arm swing - left;Decreased step length - left;Decreased stance time - left;Decreased trunk rotation;Poor foot  clearance - left;Decreased dorsiflexion - left    Ambulation Surface  Level;Indoor    Gait Comments  6MWT=670 ft;speed did decrease with increased distance along with pt c/o increase LLE stiffness with increased distance      Standardized Balance Assessment   Standardized Balance Assessment  Dynamic Gait Index      Dynamic Gait Index   Level Surface  Normal    Change in Gait Speed  Normal    Gait with Horizontal Head Turns  Mild Impairment    Gait with Vertical Head Turns  Mild Impairment    Gait and Pivot Turn  Normal    Step Over Obstacle  Mild Impairment    Step Around Obstacles  Normal    Steps  Mild Impairment    Total Score  20      Self-Care   Self-Care  Other Self-Care Comments    Other Self-Care Comments   Discussed importance of exercise and PD along with activity on the days where he is not in therapy session.  Pt reports not doing much since last session and reports lack of motivation to increase mobiliy most days.  Feels like he is slightly depressed with decreased family interaction over the holidays.  Pt asking about how to log in to my chart so provided number to help desk for my chart.  Pt reports calling Dr [name]'s office and has not heard back on neurosurgeon appointment.      Lumbar Exercises: Stretches   Single Knee to Chest Stretch  2 reps;60 seconds;Right;Left    Other Lumbar Stretch Exercise  Pelvic tilt x 10 reps with 2 sec hold      Lumbar Exercises: Aerobic   Other Aerobic Exercise  Scifit level 2.0 LE's only x 9 minutes with rpm>65 and increased to >85 last 3 minutes             PT Education - 07/29/19 1424    Education Details  importance of activity and PD, walking program, help desk for my chart    Person(s) Educated  Patient    Methods  Explanation;Demonstration;Handout    Comprehension  Verbalized understanding;Need further instruction          PT Long Term Goals - 07/29/19 1425      PT LONG TERM GOAL #1   Title  Pt will be independent  with HEP for improved transfers, gait, and trunk flexibility/decreased pain.  TARGET 06/28/2019    Baseline  07/29/19-pt reports not consistently performing   HEP and lack of mobility    Time  4    Period  Weeks    Status  Not Met      PT LONG TERM GOAL #2   Title  Pt will perform 5x sit<>stand to less than or equal to 12.5 seconds for improved functional lower extremity strength.    Baseline  13.25 on 07/19/19    Time  4    Period  Weeks    Status  Not Met      PT LONG TERM GOAL #3   Title  Pt will improve 6MWT distance to at least 630 ft indicating improved endurance for community distances.    Baseline  670 feet on 07/29/19    Time  4    Period  Weeks    Status  Achieved      PT LONG TERM GOAL #4   Title  Patient will improve DGI score to a 22/24 in order to improve dynamic gait.    Baseline  20/24 on 07/29/19    Time  4    Period  Weeks    Status  Not Met      PT LONG TERM GOAL #5   Title  Pt will verbalize understanding of local Parkinson's disease resources.    Time  4    Period  Weeks    Status  On-going            Plan - 07/29/19 1430    Clinical Impression Statement  Pt did not meet LTG's 1, 2 or 4.  LTG 3 met.  LTG 5 ongoing.  Pt reports decreased activity at home due to decreased motivation and pt reports slight depression. Pt has been unable to attend sessions consistently due to death in family and clinic staffing.  Pt now consistently scheduled for 2x/week for 4 weeks with renewal per Amy Marriott, PT.    Personal Factors and Comorbidities  Comorbidity 3+    Comorbidities  See medical history in subjective (cardiac history)    Examination-Activity Limitations  Stand;Locomotion Level;Transfers    Examination-Participation Restrictions  Yard Work   Acitivites associated with raises puppies   Stability/Clinical Decision Making  Evolving/Moderate complexity    Rehab Potential  Good    PT Frequency  2x / week    PT Duration  4 weeks   plus eval   PT  Treatment/Interventions  ADLs/Self Care Home Management;Gait training;Functional mobility training;Therapeutic activities;Therapeutic exercise;Balance training;DME Instruction;Neuromuscular re-education;Patient/family education    PT Next Visit Plan  Renewal by Amy Marriott, PT.  Try treadmill for gait.  Ask how walking program went since last visit.    PT Home Exercise Plan  [id number] - seated PWR moves  (PWR up and step!)    Consulted and Agree with Plan of Care  Patient       Patient will benefit from skilled therapeutic intervention in order to improve the following deficits and impairments:  Abnormal gait, Decreased coordination, Difficulty walking, Decreased strength, Decreased mobility, Postural dysfunction, Pain, Decreased balance  Visit Diagnosis: Unsteadiness on feet  Other abnormalities of gait and mobility  Muscle weakness (generalized)     Problem List Patient Active Problem List   Diagnosis Date Noted  . Fatigue 01/15/2019  . Chronic systolic (congestive) heart failure ([facility]) 09/18/2018  . Ischemic cardiomyopathy 09/16/2018  . Aortic regurgitation 09/16/2018  . Cardiomyopathy, unspecified ([facility]) 06/13/2018  . Abscess of right axilla 12/12/2017  . BMI 33.0-33.9,adult 12/12/2017  . S/P CABG (coronary artery bypass graft)   11/23/2017  . Acute blood loss anemia 10/25/2017  . Acute postoperative respiratory insufficiency 10/25/2017  . Postoperative delirium 10/25/2017  . Dyslipidemia 10/17/2017  . SOB (shortness of breath) 03/31/2017  . Long term current use of aspirin 03/29/2017  . Parkinson's disease ([facility]) 01/02/2017  . Morbid (severe) obesity due to excess calories ([facility]) 10/27/2015  . Memory change 07/06/2015  . Depression 07/06/2015  . Medial meniscus tear 10/11/2011  . Neuroma of foot 10/11/2011  . Coronary artery disease involving native coronary artery of native heart with angina pectoris ([facility]) 12/28/2010  . 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 Mixed  hyperlipidemia 05/20/2010  . 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/20/2010  . 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 GERD 05/20/2010  . TRANSIENT ISCHEMIC ATTACK, HX OF 05/20/2010  . NEPHROLITHIASIS, HX OF 05/20/2010  . BENIGN PROSTATIC HYPERTROPHY, HX OF, S/P TURP 05/20/2010     Terry , PTA [facility] 07/29/19 2:35 PM Phone: [phone] Fax: [phone]   [facility] [address] [place], [place], [address] Phone: [phone]   Fax:  [phone]  Name: Shawn Meza MRN: [id number] Date of Birth: 03/08/1946   For Renewal  PT End of Session - 08/04/19 2026    Visit Number  9    Number of Visits  17   per renewal 07/29/2019   Date for PT Re-Evaluation  10/26/19    Authorization Type  UHC Medicare/Medicaid    Activity Tolerance  Patient tolerated treatment well   reports pain as 2/10 at end of session   Behavior During Therapy  WFL for tasks assessed/performed      Plan - 08/04/19 2027    Clinical Impression Statement  Pt did not meet LTG's 1, 2 or 4.  LTG 3 met.  LTG 5 ongoing.  Pt reports decreased activity at home due to decreased motivation and pt reports slight depression. Pt has been unable to attend sessions consistently due to death in family and clinic staffing.  Pt now consistently scheduled for 2x/week for 4 weeks with renewal per Amy Marriott, PT.    Personal Factors and Comorbidities  Comorbidity 3+    Comorbidities  See medical history in subjective (cardiac history)    Examination-Activity Limitations  Stand;Locomotion Level;Transfers    Examination-Participation Restrictions  Yard Work   Acitivites associated with raises puppies   Stability/Clinical Decision Making  Evolving/Moderate complexity    Rehab Potential  Good    PT Frequency  2x / week    PT Duration  4 weeks   per recert 07/29/2019   PT  Treatment/Interventions  ADLs/Self Care Home Management;Gait training;Functional mobility training;Therapeutic activities;Therapeutic exercise;Balance training;DME Instruction;Neuromuscular re-education;Patient/family education    PT Next Visit Plan  Renewal by Amy Marriott, PT.  Try treadmill for gait.  Ask how walking program went since last visit.    PT Home Exercise Plan  [id number] - seated PWR moves  (PWR up and step!)    Consulted and Agree with Plan of Care  Patient      PT Long Term Goals - 08/04/19 2029      PT LONG TERM GOAL #1   Title  Pt will be independent with HEP for improved transfers, gait, and trunk flexibility/decreased pain.  Updated TARGET for all LTGs 08/30/2019    Baseline  07/29/19-pt reports not consistently performing HEP and lack of mobility    Time  4    Period  Weeks      Status  On-going      PT LONG TERM GOAL #2   Title  Pt will perform 5x sit<>stand to less than or equal to 12.5 seconds for improved functional lower extremity strength.    Baseline  13.25 on 07/19/19    Time  4    Period  Weeks    Status  On-going      PT LONG TERM GOAL #3   Title  Pt will improve 6MWT distance to at least 720 ft indicating improved endurance for community distances.    Baseline  670 feet on 07/29/19    Time  4    Period  Weeks    Status  Revised      PT LONG TERM GOAL #4   Title  Patient will improve DGI score to a 22/24 in order to improve dynamic gait.    Baseline  20/24 on 07/29/19    Time  4    Period  Weeks    Status  On-going      PT LONG TERM GOAL #5   Title  Pt will verbalize understanding of local Parkinson's disease resources.    Time  4    Period  Weeks    Status  On-going      Mady Haagensen, [place] 08/04/19 8:33 PM Phone: [phone] Fax: [phone]

## 2019-08-04 NOTE — Addendum Note (Signed)
Addended by: Frazier Butt on: 08/04/2019 08:35 PM   Modules accepted: Orders

## 2019-08-05 ENCOUNTER — Ambulatory Visit: Payer: Medicare Other | Attending: Neurology | Admitting: Physical Therapy

## 2019-08-05 ENCOUNTER — Other Ambulatory Visit: Payer: Self-pay

## 2019-08-05 ENCOUNTER — Encounter: Payer: Self-pay | Admitting: Physical Therapy

## 2019-08-05 DIAGNOSIS — R2689 Other abnormalities of gait and mobility: Secondary | ICD-10-CM | POA: Diagnosis not present

## 2019-08-05 DIAGNOSIS — M6281 Muscle weakness (generalized): Secondary | ICD-10-CM | POA: Diagnosis not present

## 2019-08-05 DIAGNOSIS — R2681 Unsteadiness on feet: Secondary | ICD-10-CM | POA: Diagnosis not present

## 2019-08-07 NOTE — Therapy (Signed)
[facility] [address] [place], [place], [address] Phone: [phone]   Fax:  [phone]  Physical Therapy Treatment  Patient Details  Name: Shawn Meza MRN: [id number] Date of Birth: [birth year] Referring Provider (PT): [name], Wells Guiles   Encounter Date: 08/05/2019  PT End of Session - 08/07/19 1944    Visit Number  10    Number of Visits  17   per renewal 07/29/2019   Date for PT Re-Evaluation  10/26/19    Authorization Type  UHC Medicare/Medicaid    PT Start Time  [id number]    PT Stop Time  1104    PT Time Calculation (min)  46 min    Activity Tolerance  Patient tolerated treatment well;Other (comment)   needed rest break after ambulation/treadmill   Behavior During Therapy  [facility] for tasks assessed/performed       Past Medical History:  Diagnosis Date  . Arthritis   . BPH (benign prostatic hypertrophy)   . Chronic coronary artery disease   . Colon cancer ([place])   . Elevated PSA   . Erectile dysfunction   . Essential hypertension   . Essential tremor   . GERD (gastroesophageal reflux disease)   . Headache(784.0)   . Hiatal hernia   . Hypercholesterolemia   . Long-term use of aspirin therapy   . Major depression, chronic   . Medial meniscus tear 10/11/2011  . Metabolic syndrome   . Morbid obesity ([place])   . Nephrolithiasis    hx of  . NSTEMI (non-ST elevated myocardial infarction) ([place])   . Parkinson's disease ([place])   . S/P CABG (coronary artery bypass graft)   . Transient ischemic attack    hx of  . Trochanteric bursitis of right hip     Past Surgical History:  Procedure Laterality Date  . CARDIAC CATHETERIZATION  5/12,1/13   4 stents placed  . COLON SURGERY    . CORONARY ARTERY BYPASS GRAFT    . KNEE ARTHROSCOPY  10/11/2011   Procedure: ARTHROSCOPY KNEE;  Surgeon: Lorn Junes, MD;  Location: [facility];  Service: Orthopedics;  Laterality: Left;  Left Knee Arthroscopy with Medial  and Lateral Partial Menisectomy, Chondroplasty  . LEFT HEART CATHETERIZATION WITH CORONARY ANGIOGRAM N/A 08/25/2011   Procedure: LEFT HEART CATHETERIZATION WITH CORONARY ANGIOGRAM;  Surgeon: Burnell Blanks, MD;  Location: [facility] CATH LAB;  Service: Cardiovascular;  Laterality: N/A;  . LITHOTRIPSY    . STERIOD INJECTION  10/11/2011   Procedure: STEROID INJECTION;  Surgeon: Lorn Junes, MD;  Location: [facility];  Service: Orthopedics;  Laterality: Right;  Steroid Injection Second Toe  . TRANSURETHRAL RESECTION OF PROSTATE    . URETHRAL DILATION      There were no vitals filed for this visit.                    [facility] Adult PT Treatment/Exercise - 08/07/19 0001      Transfers   Transfers  Sit to Stand;Stand to Sit    Sit to Stand  5: Supervision;Without upper extremity assist    Stand to Sit  5: Supervision;Without upper extremity assist      Ambulation/Gait   Ambulation/Gait  Yes    Ambulation/Gait Assistance  5: Supervision    Ambulation/Gait Assistance Details  cues for increased step length on L and arm swing.  Discussed importance of armswing and increased step length    Ambulation Distance (Feet)  750 Feet  x 1 outdoors and 115' x 2 plus 5 minutes on treadmill   Assistive device  None    Gait Pattern  Step-through pattern;Decreased arm swing - left;Decreased step length - left;Decreased stance time - left;Decreased trunk rotation;Poor foot clearance - left;Decreased dorsiflexion - left    Ambulation Surface  Level;Indoor;Other (comment);Outdoor;Paved   treadmill   Gait Comments  Pt needing significant rest break after treadmill and after gait outdoors.  Reports dyspnea but not visible to PTA.  Pt able to carry on conversation during gait.      Posture/Postural Control   Posture/Postural Control  Postural limitations    Postural Limitations  Forward head;Rounded Shoulders    Posture Comments  L shoulder lower than R      Self-Care    Self-Care  Other Self-Care Comments    Other Self-Care Comments   Again discussed importance of exercise and PD along with activity on the days where he is not in therapy session.  Pt reports not leaving the house since last session approx 1 week ago other than to go outside to feed his dogs.  Pt continues to reports lack of motivation to increase mobiliy most days.  Feels like he is slightly depressed with decreased family interaction over the holidays and numerous friends passing away along with his brother in past 7 months.             PT Education - 08/07/19 1942    Education Details  Importance of exercises/walking/mobility with PD outside of therapy sessions, plan to contact Dr [name] concerning neurosurgeon referral    Person(s) Educated  Patient    Methods  Explanation    Comprehension  Verbalized understanding;Other (comment)   needs continued reinforcement         PT Long Term Goals - 08/04/19 2029      PT LONG TERM GOAL #1   Title  Pt will be independent with HEP for improved transfers, gait, and trunk flexibility/decreased pain.  Updated TARGET for all LTGs 08/30/2019    Baseline  07/29/19-pt reports not consistently performing HEP and lack of mobility    Time  4    Period  Weeks    Status  On-going      PT LONG TERM GOAL #2   Title  Pt will perform 5x sit<>stand to less than or equal to 12.5 seconds for improved functional lower extremity strength.    Baseline  13.25 on 07/19/19    Time  4    Period  Weeks    Status  On-going      PT LONG TERM GOAL #3   Title  Pt will improve 6MWT distance to at least 720 ft indicating improved endurance for community distances.    Baseline  670 feet on 07/29/19    Time  4    Period  Weeks    Status  Revised      PT LONG TERM GOAL #4   Title  Patient will improve DGI score to a 22/24 in order to improve dynamic gait.    Baseline  20/24 on 07/29/19    Time  4    Period  Weeks    Status  On-going      PT LONG TERM GOAL #5    Title  Pt will verbalize understanding of local Parkinson's disease resources.    Time  4    Period  Weeks    Status  On-going  Plan - 08/07/19 1945    Clinical Impression Statement  Pt continues to report depression and lack of mobility along with continued leg pain and right shoulder pain. Discussed with Mady Haagensen, PT.  Continues to need cues for L step length and armswing with gait.  Continue per POC.    Personal Factors and Comorbidities  Comorbidity 3+    Comorbidities  See medical history in subjective (cardiac history)    Examination-Activity Limitations  Stand;Locomotion Level;Transfers    Examination-Participation Restrictions  Yard Work   Acitivites associated with raises puppies   Stability/Clinical Decision Making  Evolving/Moderate complexity    Rehab Potential  Good    PT Frequency  2x / week    PT Duration  4 weeks   per recert [id number]   PT Treatment/Interventions  ADLs/Self Care Home Management;Gait training;Functional mobility training;Therapeutic activities;Therapeutic exercise;Balance training;DME Instruction;Neuromuscular re-education;Patient/family education    PT Next Visit Plan  Discuss pt with Mady Haagensen, PT.  PT/PTA to f/u on neurosurgeon referral with Dr Doristine Devoid office.  Continue to encourage exercises.  Gait with walking poles?  Try to add more PWR moves to HEP    PT Home Exercise Plan  [email or web address] - seated PWR moves  (PWR up and step!)    Consulted and Agree with Plan of Care  Patient       Patient will benefit from skilled therapeutic intervention in order to improve the following deficits and impairments:  Abnormal gait, Decreased coordination, Difficulty walking, Decreased strength, Decreased mobility, Postural dysfunction, Pain, Decreased balance  Visit Diagnosis: Unsteadiness on feet  Other abnormalities of gait and mobility     Problem List Patient Active Problem List   Diagnosis Date Noted  . Fatigue 01/15/2019  . Chronic  systolic (congestive) heart failure ([place]) 09/18/2018  . Ischemic cardiomyopathy 09/16/2018  . Aortic regurgitation 09/16/2018  . Cardiomyopathy, unspecified ([place]) 06/13/2018  . Abscess of right axilla 12/12/2017  . BMI 33.0-33.9,adult 12/12/2017  . 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  . Acute blood loss anemia 10/25/2017  . Acute postoperative respiratory insufficiency 10/25/2017  . Postoperative delirium 10/25/2017  . Dyslipidemia 10/17/2017  . SOB (shortness of breath) 03/31/2017  . Long term current use of aspirin 03/29/2017  . Parkinson's disease ([place]) 01/02/2017  . Morbid (severe) obesity due to excess calories ([place]) 10/27/2015  . Memory change 07/06/2015  . Depression 07/06/2015  . Medial meniscus tear 10/11/2011  . Neuroma of foot 10/11/2011  . Coronary artery disease involving native coronary artery of native heart with angina pectoris ([place]) 12/28/2010  . 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 Mixed hyperlipidemia 05/20/2010  . Hypertensive heart disease with heart failure ([place]) 05/20/2010  . 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 GERD 05/20/2010  . TRANSIENT ISCHEMIC ATTACK, HX OF 05/20/2010  . NEPHROLITHIASIS, HX OF 05/20/2010  . BENIGN PROSTATIC HYPERTROPHY, HX OF, S/P TURP 05/20/2010   Narda Bonds, PTA [facility] 08/07/19 7:50 PM Phone: [phone] Fax: [facility] [address] [place], [place], [address] Phone: [phone]   Fax:  [phone]  Name: STODDARD TOWNE MRN: [id number] Date of Birth: 08/18/1945

## 2019-08-08 ENCOUNTER — Telehealth: Payer: Self-pay | Admitting: Neurology

## 2019-08-08 NOTE — Telephone Encounter (Signed)
Received message from PT.  Please refer pt to psychiatry if agreeable for depression (Dr. Clovis Pu).  He saw Judson Roch one time but doesn't look like he went back.  Also, re: Vertell Limber.  Not sure why they didn't call Dr. Melven Sartorius office to ask about appt or [place] to let us know.  Can you inquire.

## 2019-08-08 NOTE — Telephone Encounter (Signed)
-----   Message from Frazier Butt, PT sent at 08/08/2019  8:06 AM EST ----- Please see PT note from earlier this week.  Langley Gauss has been seeing her and she is very concerned about his depression symptoms.  In addition, pt has reported multiple times that he never heard from Dr. Melven Sartorius office.  I wanted to make you aware as you may want to follow up.  Thank you.  Mady Haagensen, PT

## 2019-08-08 NOTE — Telephone Encounter (Signed)
Left message to call office back

## 2019-08-09 ENCOUNTER — Other Ambulatory Visit: Payer: Self-pay

## 2019-08-09 ENCOUNTER — Ambulatory Visit: Payer: Medicare Other | Admitting: Physical Therapy

## 2019-08-09 ENCOUNTER — Encounter: Payer: Self-pay | Admitting: Physical Therapy

## 2019-08-09 DIAGNOSIS — M6281 Muscle weakness (generalized): Secondary | ICD-10-CM

## 2019-08-09 DIAGNOSIS — R2689 Other abnormalities of gait and mobility: Secondary | ICD-10-CM | POA: Diagnosis not present

## 2019-08-09 DIAGNOSIS — R2681 Unsteadiness on feet: Secondary | ICD-10-CM | POA: Diagnosis not present

## 2019-08-09 NOTE — Therapy (Signed)
[facility] [address] [place], [place], [address] Phone: [phone]   Fax:  [phone]  Physical Therapy Treatment  Patient Details  Name: Shawn Meza MRN: [id number] Date of Birth: 1946/03/20 Referring Provider (PT): [name], Wells Guiles   Encounter Date: 08/09/2019  PT End of Session - 08/09/19 1400    Visit Number  11    Number of Visits  17   per renewal 07/29/2019   Date for PT Re-Evaluation  10/26/19    Authorization Type  UHC Medicare/Medicaid    PT Start Time  1236    PT Stop Time  1320    PT Time Calculation (min)  44 min    Activity Tolerance  Patient tolerated treatment well    Behavior During Therapy  [facility] for tasks assessed/performed       Past Medical History:  Diagnosis Date  . Arthritis   . BPH (benign prostatic hypertrophy)   . Chronic coronary artery disease   . Colon cancer ([place])   . Elevated PSA   . Erectile dysfunction   . Essential hypertension   . Essential tremor   . GERD (gastroesophageal reflux disease)   . Headache(784.0)   . Hiatal hernia   . Hypercholesterolemia   . Long-term use of aspirin therapy   . Major depression, chronic   . Medial meniscus tear 10/11/2011  . Metabolic syndrome   . Morbid obesity ([place])   . Nephrolithiasis    hx of  . NSTEMI (non-ST elevated myocardial infarction) ([place])   . Parkinson's disease ([place])   . S/P CABG (coronary artery bypass graft)   . Transient ischemic attack    hx of  . Trochanteric bursitis of right hip     Past Surgical History:  Procedure Laterality Date  . CARDIAC CATHETERIZATION  5/12,1/13   4 stents placed  . COLON SURGERY    . CORONARY ARTERY BYPASS GRAFT    . KNEE ARTHROSCOPY  10/11/2011   Procedure: ARTHROSCOPY KNEE;  Surgeon: Lorn Junes, MD;  Location: [facility];  Service: Orthopedics;  Laterality: Left;  Left Knee Arthroscopy with Medial and Lateral Partial Menisectomy, Chondroplasty  . LEFT HEART  CATHETERIZATION WITH CORONARY ANGIOGRAM N/A 08/25/2011   Procedure: LEFT HEART CATHETERIZATION WITH CORONARY ANGIOGRAM;  Surgeon: Burnell Blanks, MD;  Location[facility] LAB;  Service: Cardiovascular;  Laterality: N/A;  . LITHOTRIPSY    . STERIOD INJECTION  10/11/2011   Procedure: STEROID INJECTION;  Surgeon: Lorn Junes, MD;  Location: [facility];  Service: Orthopedics;  Laterality: Right;  Steroid Injection Second Toe  . TRANSURETHRAL RESECTION OF PROSTATE    . URETHRAL DILATION      There were no vitals filed for this visit.  Subjective Assessment - 08/09/19 1242    Subjective  Pt reports being sore from all the walking he's been doing since last visit.  Reports walking in yard and up and down road, worked on his car for 3 days, went to see his brother.    Pertinent History  hospitalized 2019 s/p CABG x 5, ischemic cardiomyopathy,CHF, Parkinson's disease x 4 years    Patient Stated Goals  Pt's goal for therapy is to be able to do things without pain and being worn out.    Currently in Pain?  Yes    Pain Score  3     Pain Location  Back    Pain Orientation  Right;Left    Pain Descriptors / Indicators  Squeezing    Pain Type  Chronic pain    Pain Onset  More than a month ago    Pain Frequency  Constant    Pain Score  5    Pain Location  Shoulder    Pain Orientation  Right    Pain Descriptors / Indicators  Cramping    Pain Type  Chronic pain    Pain Onset  More than a month ago    Aggravating Factors   when weight bears through the arm    Pain Relieving Factors  not using it    Pain Onset  More than a month ago         [facility] Adult PT Treatment/Exercise - 08/09/19 0001      Transfers   Transfers  Sit to Stand;Stand to Sit    Sit to Stand  5: Supervision;Without upper extremity assist    Stand to Sit  5: Supervision;Without upper extremity assist      Ambulation/Gait   Ambulation/Gait  Yes    Ambulation/Gait Assistance  5: Supervision     Ambulation/Gait Assistance Details  cues for increased step length and arm swing on L    Ambulation Distance (Feet)  700 Feet   x 1 and 500 x 1   Assistive device  None;Other (Comment)   walking poles   Gait Pattern  Step-through pattern;Decreased arm swing - left;Decreased step length - left;Decreased stance time - left;Decreased trunk rotation;Poor foot clearance - left;Decreased dorsiflexion - left    Ambulation Surface  Level;Indoor    Gait Comments  Used walking poles with PTA holding end of poles to assist with arm swing then had pt working with poles with opposite arm swing.  Pt having difficulty sequencing and had to stop and restart to get back in sequence.  Pt with decreased swing on L so gets out of sequence.      Posture/Postural Control   Posture/Postural Control  Postural limitations    Postural Limitations  Forward head;Rounded Shoulders    Posture Comments  L shoulder lower than R      Self-Care   Self-Care  Other Self-Care Comments      Neuro Re-ed    Neuro Re-ed Details   PWR up in seated and PWR rock in standing.  Extensive cues for weight shifting.  Used counter for support.      Exercises   Exercises  Shoulder      Shoulder Exercises: Supine   Other Supine Exercises  using cane in supine for shoulder flexion/extension x 25 reps with increased ROM      Shoulder Exercises: Standing   ABduction  Right;10 reps    ABduction Limitations  used walk to walk fingers up    Other Standing Exercises  Finger walking up wall x 10 reps x 2 on R side with improved ROM with increased reps      Shoulder Exercises: ROM/Strengthening   Pendulum  R shoulder standing at back of chair in circles both directions and forward/backward             PT Education - 08/09/19 1359    Education Details  additions to HEP    Person(s) Educated  Patient    Methods  Explanation;Demonstration;Handout    Comprehension  Verbalized understanding;Returned demonstration          PT  Long Term Goals - 08/04/19 2029      PT LONG TERM GOAL #1   Title  Pt will be independent  with HEP for improved transfers, gait, and trunk flexibility/decreased pain.  Updated TARGET for all LTGs 08/30/2019    Baseline  07/29/19-pt reports not consistently performing HEP and lack of mobility    Time  4    Period  Weeks    Status  On-going      PT LONG TERM GOAL #2   Title  Pt will perform 5x sit<>stand to less than or equal to 12.5 seconds for improved functional lower extremity strength.    Baseline  13.25 on 07/19/19    Time  4    Period  Weeks    Status  On-going      PT LONG TERM GOAL #3   Title  Pt will improve 6MWT distance to at least 720 ft indicating improved endurance for community distances.    Baseline  670 feet on 07/29/19    Time  4    Period  Weeks    Status  Revised      PT LONG TERM GOAL #4   Title  Patient will improve DGI score to a 22/24 in order to improve dynamic gait.    Baseline  20/24 on 07/29/19    Time  4    Period  Weeks    Status  On-going      PT LONG TERM GOAL #5   Title  Pt will verbalize understanding of local Parkinson's disease resources.    Time  4    Period  Weeks    Status  On-going            Plan - 08/09/19 1400    Clinical Impression Statement  Pt seems to be brighter today during session and reports increased activity since last visit.  Session focused on gait, weight shifting and shoulder exercises.  Pt with improved R shoulder flexion at end of session.  Continue PT per POC.    Personal Factors and Comorbidities  Comorbidity 3+    Comorbidities  See medical history in subjective (cardiac history)    Examination-Activity Limitations  Stand;Locomotion Level;Transfers    Examination-Participation Restrictions  Yard Work   Acitivites associated with raises puppies   Stability/Clinical Decision Making  Evolving/Moderate complexity    Rehab Potential  Good    PT Frequency  2x / week    PT Duration  4 weeks   per recert  [id number]   PT Treatment/Interventions  ADLs/Self Care Home Management;Gait training;Functional mobility training;Therapeutic activities;Therapeutic exercise;Balance training;DME Instruction;Neuromuscular re-education;Patient/family education    PT Next Visit Plan  Did he hear back from Dr Doristine Devoid office?  How did UE exercises go?  Continue to encourage exercises.  Gait with walking poles?  Try to add more PWR moves to HEP    PT Home Exercise Plan  [email or web address] - seated PWR moves  (PWR up and step!)    Consulted and Agree with Plan of Care  Patient       Patient will benefit from skilled therapeutic intervention in order to improve the following deficits and impairments:  Abnormal gait, Decreased coordination, Difficulty walking, Decreased strength, Decreased mobility, Postural dysfunction, Pain, Decreased balance  Visit Diagnosis: Unsteadiness on feet  Other abnormalities of gait and mobility  Muscle weakness (generalized)     Problem List Patient Active Problem List   Diagnosis Date Noted  . Fatigue 01/15/2019  . Chronic systolic (congestive) heart failure ([place]) 09/18/2018  . Ischemic cardiomyopathy 09/16/2018  . Aortic regurgitation 09/16/2018  . Cardiomyopathy, unspecified ([place]) 06/13/2018  . Abscess of right  axilla 12/12/2017  . BMI 33.0-33.9,adult 12/12/2017  . 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  . Acute blood loss anemia 10/25/2017  . Acute postoperative respiratory insufficiency 10/25/2017  . Postoperative delirium 10/25/2017  . Dyslipidemia 10/17/2017  . SOB (shortness of breath) 03/31/2017  . Long term current use of aspirin 03/29/2017  . Parkinson's disease ([place] [address]) 01/02/2017  . Morbid (severe) obesity due to excess calories ([place]) 10/27/2015  . Memory change 07/06/2015  . Depression 07/06/2015  . Medial meniscus tear 10/11/2011  . Neuroma of foot 10/11/2011  . Coronary artery disease involving native coronary artery of native heart with angina pectoris  ([place]) 12/28/2010  . 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 Mixed hyperlipidemia 05/20/2010  . Hypertensive heart disease with heart failure ([place]) 05/20/2010  . 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 GERD 05/20/2010  . TRANSIENT ISCHEMIC ATTACK, HX OF 05/20/2010  . NEPHROLITHIASIS, HX OF 05/20/2010  . BENIGN PROSTATIC HYPERTROPHY, HX OF, S/P TURP 05/20/2010   Narda Bonds, PTA [facility] 08/09/19 2:04 PM Phone: [phone] Fax: [name] [address] [place], [place], [address] Phone: [phone]   Fax:  [phone]  Name: Shawn Meza MRN: [id number] Date of Birth: 01-02-1946

## 2019-08-09 NOTE — Patient Instructions (Signed)
Access Code: [email or web address]  URL: [email or web address]  Date: 08/09/2019  Prepared by: Nita Sells   Exercises  Proper Sit to Stand Technique - 5 reps - 3 sets - 1x daily - 7x weekly  Seated Hamstring Stretch - 3 sets - 30 hold - 2x daily - 7x weekly  Lower Trunk Rotations - 10 reps - 2 sets - 1x daily - 7x weekly  Seated Lumbar Flexion Stretch - 10 reps - 2 sets - 2x daily - 7x weekly  Seated Pelvic Tilt - 10 reps - 2 sets - 1x daily - 7x weekly  Staggered Stance Forward Backward Weight Shift with Counter Support - 10 reps - 1 sets - 1x daily - 5x weekly  Side to side weightshift - 10 reps - 1 sets - 1x daily - 5x weekly  Supine Shoulder Flexion with [place]el - 10 reps - 1 sets - 1x daily - 7x weekly  Standing Shoulder Flexion AAROM with Dowel - 10 reps - 1 sets - 1x daily - 7x weekly  Standing Shoulder Flexion Wall Walk - 10 reps - 1 sets - 1x daily - 7x weekly

## 2019-08-12 ENCOUNTER — Encounter: Payer: Self-pay | Admitting: Physical Therapy

## 2019-08-12 ENCOUNTER — Other Ambulatory Visit: Payer: Self-pay

## 2019-08-12 ENCOUNTER — Ambulatory Visit: Payer: Medicare Other | Admitting: Physical Therapy

## 2019-08-12 DIAGNOSIS — R2689 Other abnormalities of gait and mobility: Secondary | ICD-10-CM

## 2019-08-12 DIAGNOSIS — R2681 Unsteadiness on feet: Secondary | ICD-10-CM

## 2019-08-12 DIAGNOSIS — M6281 Muscle weakness (generalized): Secondary | ICD-10-CM

## 2019-08-12 NOTE — Telephone Encounter (Signed)
Left message to call office back

## 2019-08-13 NOTE — Telephone Encounter (Signed)
Unable to contact patient.

## 2019-08-14 ENCOUNTER — Encounter: Payer: Self-pay | Admitting: Physical Therapy

## 2019-08-14 NOTE — Therapy (Signed)
[facility] [address] [place], [place], [address] Phone: [phone]   Fax:  [phone]  Physical Therapy Treatment  Patient Details  Name: Shawn Meza MRN: [id number] Date of Birth: February 18, 1946 Referring Provider (PT): [name], Wells Guiles   Encounter Date: 08/12/2019  PT End of Session - 08/14/19 0928    Visit Number  12    Number of Visits  17   per renewal 07/29/2019   Date for PT Re-Evaluation  10/26/19    Authorization Type  UHC Medicare/Medicaid    PT Start Time  [id number]   PT late start due to previous PT eval session running late   PT Stop Time  1458    PT Time Calculation (min)  45 min    Activity Tolerance  Patient tolerated treatment well;No increased pain   Pain reports decreased back pain by end of session   Behavior During Therapy  [facility] for tasks assessed/performed       Past Medical History:  Diagnosis Date  . Arthritis   . BPH (benign prostatic hypertrophy)   . Chronic coronary artery disease   . Colon cancer ([place])   . Elevated PSA   . Erectile dysfunction   . Essential hypertension   . Essential tremor   . GERD (gastroesophageal reflux disease)   . Headache(784.0)   . Hiatal hernia   . Hypercholesterolemia   . Long-term use of aspirin therapy   . Major depression, chronic   . Medial meniscus tear 10/11/2011  . Metabolic syndrome   . Morbid obesity ([place])   . Nephrolithiasis    hx of  . NSTEMI (non-ST elevated myocardial infarction) ([place])   . Parkinson's disease ([place])   . S/P CABG (coronary artery bypass graft)   . Transient ischemic attack    hx of  . Trochanteric bursitis of right hip     Past Surgical History:  Procedure Laterality Date  . CARDIAC CATHETERIZATION  5/12,1/13   4 stents placed  . COLON SURGERY    . CORONARY ARTERY BYPASS GRAFT    . KNEE ARTHROSCOPY  10/11/2011   Procedure: ARTHROSCOPY KNEE;  Surgeon: Lorn Junes, MD;  Location: [facility];   Service: Orthopedics;  Laterality: Left;  Left Knee Arthroscopy with Medial and Lateral Partial Menisectomy, Chondroplasty  . LEFT HEART CATHETERIZATION WITH CORONARY ANGIOGRAM N/A 08/25/2011   Procedure: LEFT HEART CATHETERIZATION WITH CORONARY ANGIOGRAM;  Surgeon: Burnell Blanks, MD;  Location: [facility] CATH LAB;  Service: Cardiovascular;  Laterality: N/A;  . LITHOTRIPSY    . STERIOD INJECTION  10/11/2011   Procedure: STEROID INJECTION;  Surgeon: Lorn Junes, MD;  Location: [facility];  Service: Orthopedics;  Laterality: Right;  Steroid Injection Second Toe  . TRANSURETHRAL RESECTION OF PROSTATE    . URETHRAL DILATION      There were no vitals filed for this visit.  Subjective Assessment - 08/14/19 0921    Subjective  Pain is a grabbing pain; not as bad as it was, but it is there.  Still waiting to hear from Dr. Doristine Devoid office (PT gave pt Dr. Doristine Devoid office number so pt could call).    Pertinent History  hospitalized 2019 s/p CABG x 5, ischemic cardiomyopathy,CHF, Parkinson's disease x 4 years    Patient Stated Goals  Pt's goal for therapy is to be able to do things without pain and being worn out.    Currently in Pain?  Yes    Pain Score  6     Pain Location  Back    Pain Orientation  Left    Pain Descriptors / Indicators  --   grabbing   Pain Type  Chronic pain    Pain Onset  More than a month ago    Pain Frequency  Constant    Aggravating Factors   moving it    Pain Relieving Factors  not moving    Pain Onset  More than a month ago    Pain Onset  More than a month ago                       [facility] Adult PT Treatment/Exercise - 08/14/19 0923      Ambulation/Gait   Ambulation/Gait  Yes    Ambulation/Gait Assistance  5: Supervision    Ambulation/Gait Assistance Details  Cues for increased step length, arm swing L side    Ambulation Distance (Feet)  430 Feet   530   Assistive device  None;Other (Comment)   walking poles   Gait Pattern   Step-through pattern;Decreased arm swing - left;Decreased step length - left;Decreased stance time - left;Decreased trunk rotation;Poor foot clearance - left;Decreased dorsiflexion - left    Ambulation Surface  Level;Indoor    Gait Comments  Used bilateral walking poles, PT behind patient and pt holding poles, to faciliate reciprocal arm swing.  Cues to patient to take LONG STRIDES.  Once walking poles removed, pt able to continue with long strides and reciprocal arm swings x approximately 100 ft. Discussed walking with intention, at home, even shorter distances, to work on increased step length, upright posture, and relaxed arm swing.      Lumbar Exercises: Aerobic   Nustep  NuStep, Level 4, 4 extremities, x 8 minutes for leg flexibility and strengthening.      REviewed shoulder exercises from last visit, with pt return demo understanding (pt with minimal to no c/o pain in shoulder with review of additions to HEP)  Seated PWR! Moves:  PWR! Up x 5 reps (low shoulder positioning), PWR! Rock for weightshifting x 5 reps, with low reach across body, PWR! Step, single step out and in x 5 reps each side  Standing PWR! Moves, with chair in front for safety:  PWR! UP x 5 reps, PWR! Rock x 5 reps each side, cues for weightshifting through hips, PWR! Step x 5 reps each side, cues for foot clearance, weightshift, step length            PT Long Term Goals - 08/04/19 2029      PT LONG TERM GOAL #1   Title  Pt will be independent with HEP for improved transfers, gait, and trunk flexibility/decreased pain.  Updated TARGET for all LTGs 08/30/2019    Baseline  07/29/19-pt reports not consistently performing HEP and lack of mobility    Time  4    Period  Weeks    Status  On-going      PT LONG TERM GOAL #2   Title  Pt will perform 5x sit<>stand to less than or equal to 12.5 seconds for improved functional lower extremity strength.    Baseline  13.25 on 07/19/19    Time  4    Period  Weeks    Status   On-going      PT LONG TERM GOAL #3   Title  Pt will improve 6MWT distance to at least 720 ft indicating improved endurance for community distances.  Baseline  670 feet on 07/29/19    Time  4    Period  Weeks    Status  Revised      PT LONG TERM GOAL #4   Title  Patient will improve DGI score to a 22/24 in order to improve dynamic gait.    Baseline  20/24 on 07/29/19    Time  4    Period  Weeks    Status  On-going      PT LONG TERM GOAL #5   Title  Pt will verbalize understanding of local Parkinson's disease resources.    Time  4    Period  Weeks    Status  On-going            Plan - 08/14/19 0929    Clinical Impression Statement  Pt with overall decreased reports of pain at end of PT session, indicating exercise, gait and general movement may help alleviate stiffnes and pain.  Explained and reiterated this to patient throughout session for improved carryover with movement and exercises at home.  Pt will continue to benefit from skilled PT to address funcitonal mobility, towards LTGs.    Personal Factors and Comorbidities  Comorbidity 3+    Comorbidities  See medical history in subjective (cardiac history)    Examination-Activity Limitations  Stand;Locomotion Level;Transfers    Examination-Participation Restrictions  Yard Work   Acitivites associated with raises puppies   Stability/Clinical Decision Making  Evolving/Moderate complexity    Rehab Potential  Good    PT Frequency  2x / week    PT Duration  4 weeks   per recert [id number]   PT Treatment/Interventions  ADLs/Self Care Home Management;Gait training;Functional mobility training;Therapeutic activities;Therapeutic exercise;Balance training;DME Instruction;Neuromuscular re-education;Patient/family education    PT Next Visit Plan  Did he hear back from Dr Doristine Devoid office (or call them, as PT gave pt Dr. Doristine Devoid number)?  Continue to encourage exercises.  Gait with walking poles for reciprocal arm swing, posture, step  length; Try to add more PWR moves to HEP    PT Home Exercise Plan  [id number] - seated PWR moves  (PWR up and step!)    Consulted and Agree with Plan of Care  Patient       Patient will benefit from skilled therapeutic intervention in order to improve the following deficits and impairments:  Abnormal gait, Decreased coordination, Difficulty walking, Decreased strength, Decreased mobility, Postural dysfunction, Pain, Decreased balance  Visit Diagnosis: Other abnormalities of gait and mobility  Muscle weakness (generalized)  Unsteadiness on feet     Problem List Patient Active Problem List   Diagnosis Date Noted  . Fatigue 01/15/2019  . Chronic systolic (congestive) heart failure ([place]) 09/18/2018  . Ischemic cardiomyopathy 09/16/2018  . Aortic regurgitation 09/16/2018  . Cardiomyopathy, unspecified ([place]) 06/13/2018  . Abscess of right axilla 12/12/2017  . BMI 33.0-33.9,adult 12/12/2017  . 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  . Acute blood loss anemia 10/25/2017  . Acute postoperative respiratory insufficiency 10/25/2017  . Postoperative delirium 10/25/2017  . Dyslipidemia 10/17/2017  . SOB (shortness of breath) 03/31/2017  . Long term current use of aspirin 03/29/2017  . Parkinson's disease ([place]) 01/02/2017  . Morbid (severe) obesity due to excess calories ([place]) 10/27/2015  . Memory change 07/06/2015  . Depression 07/06/2015  . Medial meniscus tear 10/11/2011  . Neuroma of foot 10/11/2011  . Coronary artery disease involving native coronary artery of native heart with angina pectoris ([place]) 12/28/2010  . 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  Mixed hyperlipidemia 05/20/2010  . Hypertensive heart disease with heart failure ([place]) 05/20/2010  . 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 GERD 05/20/2010  . TRANSIENT ISCHEMIC ATTACK, HX OF 05/20/2010  . NEPHROLITHIASIS, HX OF 05/20/2010  . BENIGN PROSTATIC HYPERTROPHY, HX OF, S/P TURP 05/20/2010     Kersti Scavone W. 08/14/2019, 9:33 AM  Mady Haagensen, PT 08/14/19 9:35 AM Phone: [phone] Fax: [facility] [address] [place], [place], [address] Phone: [phone]   Fax:  [phone]  Name: JAXON THORESEN MRN: [id number] Date of Birth: 1946-06-11

## 2019-08-16 ENCOUNTER — Other Ambulatory Visit: Payer: Self-pay

## 2019-08-16 ENCOUNTER — Encounter: Payer: Self-pay | Admitting: Physical Therapy

## 2019-08-16 ENCOUNTER — Ambulatory Visit: Payer: Medicare Other | Admitting: Physical Therapy

## 2019-08-16 DIAGNOSIS — R2689 Other abnormalities of gait and mobility: Secondary | ICD-10-CM

## 2019-08-16 DIAGNOSIS — M6281 Muscle weakness (generalized): Secondary | ICD-10-CM | POA: Diagnosis not present

## 2019-08-16 DIAGNOSIS — R2681 Unsteadiness on feet: Secondary | ICD-10-CM | POA: Diagnosis not present

## 2019-08-17 NOTE — Therapy (Signed)
[facility] [address] [place], [place], [address] Phone: [phone]   Fax:  [phone]  Physical Therapy Treatment  Patient Details  Name: Shawn Meza MRN: [id number] Date of Birth: 1946-02-09 Referring Provider (PT): [name], Wells Guiles   Encounter Date: 08/16/2019  PT End of Session - 08/17/19 1042    Visit Number  13    Number of Visits  17   per renewal 07/29/2019   Date for PT Re-Evaluation  10/26/19    Authorization Type  UHC Medicare/Medicaid    PT Start Time  [id number]    PT Stop Time  1314    PT Time Calculation (min)  43 min    Activity Tolerance  Patient tolerated treatment well;No increased pain   Pain reports decreased back pain by end of session-rates as a 2/10   Behavior During Therapy  [facility] for tasks assessed/performed       Past Medical History:  Diagnosis Date  . Arthritis   . BPH (benign prostatic hypertrophy)   . Chronic coronary artery disease   . Colon cancer ([place])   . Elevated PSA   . Erectile dysfunction   . Essential hypertension   . Essential tremor   . GERD (gastroesophageal reflux disease)   . Headache(784.0)   . Hiatal hernia   . Hypercholesterolemia   . Long-term use of aspirin therapy   . Major depression, chronic   . Medial meniscus tear 10/11/2011  . Metabolic syndrome   . Morbid obesity ([place])   . Nephrolithiasis    hx of  . NSTEMI (non-ST elevated myocardial infarction) ([place])   . Parkinson's disease ([place])   . S/P CABG (coronary artery bypass graft)   . Transient ischemic attack    hx of  . Trochanteric bursitis of right hip     Past Surgical History:  Procedure Laterality Date  . CARDIAC CATHETERIZATION  5/12,1/13   4 stents placed  . COLON SURGERY    . CORONARY ARTERY BYPASS GRAFT    . KNEE ARTHROSCOPY  10/11/2011   Procedure: ARTHROSCOPY KNEE;  Surgeon: Lorn Junes, MD;  Location: [facility];  Service: Orthopedics;  Laterality: Left;  Left Knee  Arthroscopy with Medial and Lateral Partial Menisectomy, Chondroplasty  . LEFT HEART CATHETERIZATION WITH CORONARY ANGIOGRAM N/A 08/25/2011   Procedure: LEFT HEART CATHETERIZATION WITH CORONARY ANGIOGRAM;  Surgeon: Burnell Blanks, MD;  Location: [facility] CATH LAB;  Service: Cardiovascular;  Laterality: N/A;  . LITHOTRIPSY    . STERIOD INJECTION  10/11/2011   Procedure: STEROID INJECTION;  Surgeon: Lorn Junes, MD;  Location: [facility];  Service: Orthopedics;  Laterality: Right;  Steroid Injection Second Toe  . TRANSURETHRAL RESECTION OF PROSTATE    . URETHRAL DILATION      There were no vitals filed for this visit.  Subjective Assessment - 08/16/19 1232    Subjective  I'm sore today, I think from what we did the other day.  It's still the low part of my left back.  It's a good sore, though.  Feel like I worked.    Pertinent History  hospitalized 2019 s/p CABG x 5, ischemic cardiomyopathy,CHF, Parkinson's disease x 4 years    Patient Stated Goals  Pt's goal for therapy is to be able to do things without pain and being worn out.    Currently in Pain?  Yes    Pain Score  6     Pain Location  Back  Pain Orientation  Left;Lower    Pain Descriptors / Indicators  Sore   grabbing   Pain Type  Chronic pain    Pain Onset  More than a month ago    Pain Frequency  Constant    Aggravating Factors   moving it    Pain Relieving Factors  not moving;    Pain Onset  More than a month ago    Pain Onset  More than a month ago                       [facility] Adult PT Treatment/Exercise - 08/17/19 0001      Ambulation/Gait   Ambulation/Gait  Yes    Ambulation/Gait Assistance  5: Supervision    Ambulation/Gait Assistance Details  Pt noted to carryover gait improvements from last session.  Does not need walking poles today.  Cues for widened BOS and lateral weightshifting to initiate gait.  Cues for bief rest breaks if needed during gait to reset L step length.     Ambulation Distance (Feet)  230 Feet   x 2; 80 ft x 2   Assistive device  None    Gait Pattern  Step-through pattern;Decreased arm swing - left;Decreased step length - left;Decreased stance time - left;Decreased trunk rotation;Decreased dorsiflexion - left   Improving, L arm swing decr with fatigue   Ambulation Surface  Level;Indoor      Therapeutic Activites    Therapeutic Activities  Other Therapeutic Activities    Other Therapeutic Activities  Pt reports multipe times during session about difficulty with bed mobility.  With pt in supine, utilized PWR! Moves to help with bed mobility:  PWR! Rock-using foot to push off, cues to reach up with arm, x 5-8 reps each side; then used hooklying marching x 5 reps, 2 sets; bridging x 5 reps, as means for improved initiation of movement in bed.  Practiced rolling supine<R, x 3 reps, the supine>R sidelying>sit (using simulated bed rail) with min guard and cues for increased intensity of movement.      Lumbar Exercises: Stretches   Lower Trunk Rotation  3 reps;30 seconds   Initiated with rocking side to side, 5 reps x 2 sets   Other Lumbar Stretch Exercise  Seated lumbar stretch, with UEs supported on green therapy ball in front of him, forward lean at ball x 5 reps, 10 second hold; then side to side movement of ball for stretch along lateral low back muscles, x 5 reps each side.      Lumbar Exercises: Aerobic   Nustep  NuStep, Level 4, 4 extremities, x 8 minutes for leg flexibility and strengthening.      Lumbar Exercises: Standing   Other Standing Lumbar Exercises  Attempted massage to low back paraspinal musculature, standing at wall, with tennis ball, for massage with pt's gentle movement.  (Did not work well, as tennis ball was sliding, not rolling), but pt reports the self-massage feels good to that low back area.                  PT Long Term Goals - 08/04/19 2029      PT LONG TERM GOAL #1   Title  Pt will be independent with HEP  for improved transfers, gait, and trunk flexibility/decreased pain.  Updated TARGET for all LTGs 08/30/2019    Baseline  07/29/19-pt reports not consistently performing HEP and lack of mobility    Time  4  Period  Weeks    Status  On-going      PT LONG TERM GOAL #2   Title  Pt will perform 5x sit<>stand to less than or equal to 12.5 seconds for improved functional lower extremity strength.    Baseline  13.25 on 07/19/19    Time  4    Period  Weeks    Status  On-going      PT LONG TERM GOAL #3   Title  Pt will improve 6MWT distance to at least 720 ft indicating improved endurance for community distances.    Baseline  670 feet on 07/29/19    Time  4    Period  Weeks    Status  Revised      PT LONG TERM GOAL #4   Title  Patient will improve DGI score to a 22/24 in order to improve dynamic gait.    Baseline  20/24 on 07/29/19    Time  4    Period  Weeks    Status  On-going      PT LONG TERM GOAL #5   Title  Pt will verbalize understanding of local Parkinson's disease resources.    Time  4    Period  Weeks    Status  On-going            Plan - 08/17/19 1043    Clinical Impression Statement  Pt noted to have good carryover of cues from last visit for improved gait pattern with arm swing and step length on L.  Continued to reinforce optimal gait pattern, worked on lower back flexibility exercises, seated PWR! Moves, and bed mobitliy activities.  Pt wil continue to work towards improved mobility, he seems to repsond well to NuStep and exercises thorugh session, lowering his pain.    Personal Factors and Comorbidities  Comorbidity 3+    Comorbidities  See medical history in subjective (cardiac history)    Examination-Activity Limitations  Stand;Locomotion Level;Transfers    Examination-Participation Restrictions  Yard Work   Acitivites associated with raises puppies   Stability/Clinical Decision Making  Evolving/Moderate complexity    Rehab Potential  Good    PT Frequency  2x  / week    PT Duration  4 weeks   per recert [id number]   PT Treatment/Interventions  ADLs/Self Care Home Management;Gait training;Functional mobility training;Therapeutic activities;Therapeutic exercise;Balance training;DME Instruction;Neuromuscular re-education;Patient/family education    PT Next Visit Plan  Did he hear back from Dr Doristine Devoid office (or call them, as PT gave pt Dr. Doristine Devoid number)?  Continue to encourage exercises.  Gait with reciprocal arm swing, posture, step length; Try to add more PWR moves to HEP (seated/standing positions); bed mobility practice if needed    PT Home Exercise Plan  [id number] - seated PWR moves  (PWR up and step!)    Consulted and Agree with Plan of Care  Patient       Patient will benefit from skilled therapeutic intervention in order to improve the following deficits and impairments:  Abnormal gait, Decreased coordination, Difficulty walking, Decreased strength, Decreased mobility, Postural dysfunction, Pain, Decreased balance  Visit Diagnosis: Muscle weakness (generalized)  Other abnormalities of gait and mobility     Problem List Patient Active Problem List   Diagnosis Date Noted  . Fatigue 01/15/2019  . Chronic systolic (congestive) heart failure ([place]) 09/18/2018  . Ischemic cardiomyopathy 09/16/2018  . Aortic regurgitation 09/16/2018  . Cardiomyopathy, unspecified ([place]) 06/13/2018  . Abscess of right axilla 12/12/2017  . BMI 33.0-33.9,adult  12/12/2017  . 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  . Acute blood loss anemia 10/25/2017  . Acute postoperative respiratory insufficiency 10/25/2017  . Postoperative delirium 10/25/2017  . Dyslipidemia 10/17/2017  . SOB (shortness of breath) 03/31/2017  . Long term current use of aspirin 03/29/2017  . Parkinson's disease ([place]) 01/02/2017  . Morbid (severe) obesity due to excess calories ([place]) 10/27/2015  . Memory change 07/06/2015  . Depression 07/06/2015  . Medial meniscus tear  10/11/2011  . Neuroma of foot 10/11/2011  . Coronary artery disease involving native coronary artery of native heart with angina pectoris ([place]) 12/28/2010  . 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 Mixed hyperlipidemia 05/20/2010  . Hypertensive heart disease with heart failure ([place]) 05/20/2010  . 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 GERD 05/20/2010  . TRANSIENT ISCHEMIC ATTACK, HX OF 05/20/2010  . NEPHROLITHIASIS, HX OF 05/20/2010  . BENIGN PROSTATIC HYPERTROPHY, HX OF, S/P TURP 05/20/2010    Juell Radney W. 08/17/2019, 10:49 AM  Frazier Butt., PT   [facility] [address] [place], [place], [address] Phone: [phone]   Fax:  [phone]  Name: Shawn Meza MRN: [id number] Date of Birth: Nov 23, 1945

## 2019-08-19 ENCOUNTER — Encounter: Payer: Self-pay | Admitting: Physical Therapy

## 2019-08-19 ENCOUNTER — Ambulatory Visit: Payer: Medicare Other | Admitting: Physical Therapy

## 2019-08-19 ENCOUNTER — Other Ambulatory Visit: Payer: Self-pay

## 2019-08-19 DIAGNOSIS — M6281 Muscle weakness (generalized): Secondary | ICD-10-CM | POA: Diagnosis not present

## 2019-08-19 DIAGNOSIS — R2689 Other abnormalities of gait and mobility: Secondary | ICD-10-CM

## 2019-08-19 DIAGNOSIS — R2681 Unsteadiness on feet: Secondary | ICD-10-CM

## 2019-08-19 NOTE — Therapy (Signed)
[facility] [address] [place], [place], [address] Phone: [phone]   Fax:  [phone]  Physical Therapy Treatment  Patient Details  Name: Shawn Meza MRN: [id number] Date of Birth: [birth year] Referring Provider (PT): [name], Wells Guiles   Encounter Date: 08/19/2019  PT End of Session - 08/19/19 1333    Visit Number  14    Number of Visits  17   per renewal 07/29/2019   Date for PT Re-Evaluation  10/26/19    Authorization Type  UHC Medicare/Medicaid    PT Start Time  1104    PT Stop Time  1145    PT Time Calculation (min)  41 min    Activity Tolerance  Patient tolerated treatment well;No increased pain   Pain reports decreased back pain by end of session-rates as a 2/10   Behavior During Therapy  [facility] for tasks assessed/performed       Past Medical History:  Diagnosis Date  . Arthritis   . BPH (benign prostatic hypertrophy)   . Chronic coronary artery disease   . Colon cancer ([place])   . Elevated PSA   . Erectile dysfunction   . Essential hypertension   . Essential tremor   . GERD (gastroesophageal reflux disease)   . Headache(784.0)   . Hiatal hernia   . Hypercholesterolemia   . Long-term use of aspirin therapy   . Major depression, chronic   . Medial meniscus tear 10/11/2011  . Metabolic syndrome   . Morbid obesity ([place])   . Nephrolithiasis    hx of  . NSTEMI (non-ST elevated myocardial infarction) ([place])   . Parkinson's disease ([place])   . S/P CABG (coronary artery bypass graft)   . Transient ischemic attack    hx of  . Trochanteric bursitis of right hip     Past Surgical History:  Procedure Laterality Date  . CARDIAC CATHETERIZATION  5/12,1/13   4 stents placed  . COLON SURGERY    . CORONARY ARTERY BYPASS GRAFT    . KNEE ARTHROSCOPY  10/11/2011   Procedure: ARTHROSCOPY KNEE;  Surgeon: Lorn Junes, MD;  Location: [facility];  Service: Orthopedics;  Laterality: Left;  Left Knee  Arthroscopy with Medial and Lateral Partial Menisectomy, Chondroplasty  . LEFT HEART CATHETERIZATION WITH CORONARY ANGIOGRAM N/A 08/25/2011   Procedure: LEFT HEART CATHETERIZATION WITH CORONARY ANGIOGRAM;  Surgeon: Burnell Blanks, MD;  Location[facility] C[facility] LAB;  Service: Cardiovascular;  Laterality: N/A;  . LITHOTRIPSY    . STERIOD INJECTION  10/11/2011   Procedure: STEROID INJECTION;  Surgeon: Lorn Junes, MD;  Location: [facility];  Service: Orthopedics;  Laterality: Right;  Steroid Injection Second Toe  . TRANSURETHRAL RESECTION OF PROSTATE    . URETHRAL DILATION      There were no vitals filed for this visit.  Subjective Assessment - 08/19/19 1109    Subjective  Pt reports doing lots of riding yesterday to visit his son in [place].  Feeling stiff today.    Pertinent History  hospitalized 2019 s/p CABG x 5, ischemic cardiomyopathy,CHF, Parkinson's disease x 4 years    Patient Stated Goals  Pt's goal for therapy is to be able to do things without pain and being worn out.    Currently in Pain?  Yes    Pain Score  6     Pain Location  Back    Pain Orientation  Left;Lower    Pain Descriptors / Indicators  Aching;Sore  Pain Type  Chronic pain    Pain Onset  More than a month ago    Pain Frequency  Constant    Pain Onset  More than a month ago    Pain Onset  More than a month ago       [facility] Adult PT Treatment/Exercise - 08/19/19 0001      Bed Mobility   Bed Mobility  Rolling Right;Rolling Left;Supine to Sit;Sit to Supine    Rolling Right  Supervision/verbal cueing    Rolling Left  Supervision/Verbal cueing    Supine to Sit  Supervision/Verbal cueing    Sit to Supine  Supervision/Verbal cueing      Transfers   Transfers  Sit to Stand;Stand to Sit    Sit to Stand  5: Supervision    Stand to Sit  5: Supervision    Number of Reps  --   through out session     Ambulation/Gait   Ambulation/Gait  Yes    Ambulation/Gait Assistance  5: Supervision     Ambulation/Gait Assistance Details  cues for arm swing, increased cadence and weight shifting    Ambulation Distance (Feet)  440 Feet   x 1, 115 x 2 and 230 x 1   Assistive device  None    Gait Pattern  Step-through pattern;Decreased arm swing - left;Decreased step length - left;Decreased stance time - left;Decreased trunk rotation;Decreased dorsiflexion - left    Ambulation Surface  Level;Indoor      Posture/Postural Control   Posture/Postural Control  Postural limitations    Postural Limitations  Forward head;Rounded Shoulders    Posture Comments  L shoulder lower than R      Exercises   Exercises  Shoulder      Lumbar Exercises: Stretches   Passive Hamstring Stretch  Right;Left;2 reps;Other (comment)   contract/relax with PTA performing PROM   Single Knee to Chest Stretch  2 reps;60 seconds    Lower Trunk Rotation  3 reps;30 seconds      Lumbar Exercises: Aerobic   Other Aerobic Exercise  Scifit level 2.5 all 4 extremities x 8 minutes with rpm>70      Shoulder Exercises: Standing   ABduction  Left;10 reps    ABduction Limitations  used wall to walk fingers up wall.  cues to avoid shoulder elevation.  also performed PROM in seated position        PWR [facility]) - 08/19/19 1332    PWR! exercises  Moves in supine    PWR! Up  20    PWR! Rock  20    PWR! Twist  20    PWR! Step  10   10 bridges and 10 alternating LE march   Comments  cues for intensity (verbal and tactile)               PT Long Term Goals - 08/04/19 2029      PT LONG TERM GOAL #1   Title  Pt will be independent with HEP for improved transfers, gait, and trunk flexibility/decreased pain.  Updated TARGET for all LTGs 08/30/2019    Baseline  07/29/19-pt reports not consistently performing HEP and lack of mobility    Time  4    Period  Weeks    Status  On-going      PT LONG TERM GOAL #2   Title  Pt will perform 5x sit<>stand to less than or equal to 12.5 seconds for improved functional lower extremity  strength.    Baseline  13.25 on 07/19/19    Time  4    Period  Weeks    Status  On-going      PT LONG TERM GOAL #3   Title  Pt will improve 6MWT distance to at least 720 ft indicating improved endurance for community distances.    Baseline  670 feet on 07/29/19    Time  4    Period  Weeks    Status  Revised      PT LONG TERM GOAL #4   Title  Patient will improve DGI score to a 22/24 in order to improve dynamic gait.    Baseline  20/24 on 07/29/19    Time  4    Period  Weeks    Status  On-going      PT LONG TERM GOAL #5   Title  Pt will verbalize understanding of local Parkinson's disease resources.    Time  4    Period  Weeks    Status  On-going            Plan - 08/19/19 1334    Clinical Impression Statement  Continue to encourage activity outside of sessions and pt reports doing more and feels like he is moving better.  continues to need cues for intensity and technique.  Still having decreased AROM of R shoulder from fall.  Continue PT per POC.    Personal Factors and Comorbidities  Comorbidity 3+    Comorbidities  See medical history in subjective (cardiac history)    Examination-Activity Limitations  Stand;Locomotion Level;Transfers    Examination-Participation Restrictions  Yard Work   Acitivites associated with raises puppies   Stability/Clinical Decision Making  Evolving/Moderate complexity    Rehab Potential  Good    PT Frequency  2x / week    PT Duration  4 weeks   per recert [id number]   PT Treatment/Interventions  ADLs/Self Care Home Management;Gait training;Functional mobility training;Therapeutic activities;Therapeutic exercise;Balance training;DME Instruction;Neuromuscular re-education;Patient/family education    PT Next Visit Plan  Did he hear back from Dr Doristine Devoid office (or call them, as PT gave pt Dr. Doristine Devoid number)?  Continue to encourage exercises.  Gait with reciprocal arm swing, posture, step length; Try to add more PWR moves to HEP (seated/standing  positions); bed mobility practice if needed    PT Home Exercise Plan  [id number] - seated PWR moves  (PWR up and step!)    Consulted and Agree with Plan of Care  Patient       Patient will benefit from skilled therapeutic intervention in order to improve the following deficits and impairments:  Abnormal gait, Decreased coordination, Difficulty walking, Decreased strength, Decreased mobility, Postural dysfunction, Pain, Decreased balance  Visit Diagnosis: Muscle weakness (generalized)  Other abnormalities of gait and mobility  Unsteadiness on feet     Problem List Patient Active Problem List   Diagnosis Date Noted  . Fatigue 01/15/2019  . Chronic systolic (congestive) heart failure ([place]) 09/18/2018  . Ischemic cardiomyopathy 09/16/2018  . Aortic regurgitation 09/16/2018  . Cardiomyopathy, unspecified ([place]) 06/13/2018  . Abscess of right axilla 12/12/2017  . BMI 33.0-33.9,adult 12/12/2017  . 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  . Acute blood loss anemia 10/25/2017  . Acute postoperative respiratory insufficiency 10/25/2017  . Postoperative delirium 10/25/2017  . Dyslipidemia 10/17/2017  . SOB (shortness of breath) 03/31/2017  . Long term current use of aspirin 03/29/2017  . Parkinson's disease ([place]) 01/02/2017  . Morbid (severe) obesity due to excess calories ([place]) 10/27/2015  .  Memory change 07/06/2015  . Depression 07/06/2015  . Medial meniscus tear 10/11/2011  . Neuroma of foot 10/11/2011  . Coronary artery disease involving native coronary artery of native heart with angina pectoris ([place]) 12/28/2010  . 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 Mixed hyperlipidemia 05/20/2010  . Hypertensive heart disease with heart failure ([place]) 05/20/2010  . 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 GERD 05/20/2010  . TRANSIENT ISCHEMIC ATTACK, HX OF 05/20/2010  . NEPHROLITHIASIS, HX OF 05/20/2010  . BENIGN PROSTATIC HYPERTROPHY, HX OF, S/P TURP  05/20/2010   Narda Bonds, PTA [facility] 08/19/19 1:37 PM Phone: [phone] Fax: [facility] [address] [place], [place], [address] Phone: [phone]   Fax:  [phone]  Name: Shawn Meza MRN: [id number] Date of Birth: 06-Feb-1946

## 2019-08-23 ENCOUNTER — Ambulatory Visit: Payer: Medicare Other | Admitting: Physical Therapy

## 2019-08-26 ENCOUNTER — Ambulatory Visit: Payer: Medicare Other | Admitting: Physical Therapy

## 2019-08-26 ENCOUNTER — Other Ambulatory Visit: Payer: Self-pay | Admitting: Cardiology

## 2019-08-30 ENCOUNTER — Other Ambulatory Visit: Payer: Self-pay

## 2019-08-30 ENCOUNTER — Ambulatory Visit: Payer: Medicare Other | Admitting: Physical Therapy

## 2019-08-30 ENCOUNTER — Encounter: Payer: Self-pay | Admitting: Physical Therapy

## 2019-08-30 DIAGNOSIS — R2689 Other abnormalities of gait and mobility: Secondary | ICD-10-CM | POA: Diagnosis not present

## 2019-08-30 DIAGNOSIS — M6281 Muscle weakness (generalized): Secondary | ICD-10-CM | POA: Diagnosis not present

## 2019-08-30 DIAGNOSIS — R2681 Unsteadiness on feet: Secondary | ICD-10-CM

## 2019-08-30 NOTE — Therapy (Signed)
[facility] [address] [place], [place], [address] Phone: [phone]   Fax:  [phone]  Physical Therapy Treatment  Patient Details  Name: Shawn Meza MRN: [id number] Date of Birth: Sep 24, 1945 Referring Provider (PT): [name], Wells Guiles   Encounter Date: 08/30/2019  PT End of Session - 08/30/19 1545    Visit Number  15    Number of Visits  17   per renewal 07/29/2019   Date for PT Re-Evaluation  10/26/19    Authorization Type  UHC Medicare/Medicaid    PT Start Time  5397    PT Stop Time  1447    PT Time Calculation (min)  43 min    Activity Tolerance  Patient tolerated treatment well   Pain reports decreased back pain by end of session-rates as a 2/10   Behavior During Therapy  [facility] for tasks assessed/performed       Past Medical History:  Diagnosis Date  . Arthritis   . BPH (benign prostatic hypertrophy)   . Chronic coronary artery disease   . Colon cancer ([place])   . Elevated PSA   . Erectile dysfunction   . Essential hypertension   . Essential tremor   . GERD (gastroesophageal reflux disease)   . Headache(784.0)   . Hiatal hernia   . Hypercholesterolemia   . Long-term use of aspirin therapy   . Major depression, chronic   . Medial meniscus tear 10/11/2011  . Metabolic syndrome   . Morbid obesity ([place])   . Nephrolithiasis    hx of  . NSTEMI (non-ST elevated myocardial infarction) ([place])   . Parkinson's disease ([place])   . S/P CABG (coronary artery bypass graft)   . Transient ischemic attack    hx of  . Trochanteric bursitis of right hip     Past Surgical History:  Procedure Laterality Date  . CARDIAC CATHETERIZATION  5/12,1/13   4 stents placed  . COLON SURGERY    . CORONARY ARTERY BYPASS GRAFT    . KNEE ARTHROSCOPY  10/11/2011   Procedure: ARTHROSCOPY KNEE;  Surgeon: Lorn Junes, MD;  Location: [facility];  Service: Orthopedics;  Laterality: Left;  Left Knee Arthroscopy with  Medial and Lateral Partial Menisectomy, Chondroplasty  . LEFT HEART CATHETERIZATION WITH CORONARY ANGIOGRAM N/A 08/25/2011   Procedure: LEFT HEART CATHETERIZATION WITH CORONARY ANGIOGRAM;  Surgeon: Burnell Blanks, MD;  Location: [facility] CATH LAB;  Service: Cardiovascular;  Laterality: N/A;  . LITHOTRIPSY    . STERIOD INJECTION  10/11/2011   Procedure: STEROID INJECTION;  Surgeon: Lorn Junes, MD;  Location: [facility];  Service: Orthopedics;  Laterality: Right;  Steroid Injection Second Toe  . TRANSURETHRAL RESECTION OF PROSTATE    . URETHRAL DILATION      There were no vitals filed for this visit.  Subjective Assessment - 08/30/19 1414    Subjective  Pt report increasing tremors the past several days but thinks it migh tbe his nerves.  Reports his ex wife was initially wanting to get back together now she has changes her mind.  Pt reports he feels like his breathing is stronger when walking now (not as SOB).  Has not been able to walk outdoors as much since last visit due to weather.    Pertinent History  hospitalized 2019 s/p CABG x 5, ischemic cardiomyopathy,CHF, Parkinson's disease x 4 years    Patient Stated Goals  Pt's goal for therapy is to be able to do things without  pain and being worn out.    Currently in Pain?  Yes    Pain Score  5    zero at rest   Pain Location  Back    Pain Orientation  Lower;Right;Left    Pain Descriptors / Indicators  Squeezing    Pain Type  Chronic pain    Pain Onset  More than a month ago    Pain Frequency  Intermittent    Aggravating Factors   certain movements    Pain Relieving Factors  was better when he was walking more.  hasnt walked lately due to the weather    Pain Onset  More than a month ago    Pain Onset  More than a month ago         [facility] Adult PT Treatment/Exercise - 08/30/19 0001      Transfers   Transfers  Sit to Stand;Stand to Sit    Sit to Stand  6: Modified independent (Device/Increase time)    Five time  sit to stand comments   11.69 seconds    Stand to Sit  6: Modified independent (Device/Increase time)      Ambulation/Gait   Ambulation/Gait  Yes    Ambulation/Gait Assistance  5: Supervision    Ambulation/Gait Assistance Details  cues for arm swing    Ambulation Distance (Feet)  1346 Feet   in 6MWT;did report increased tightness L hip with distance   Assistive device  None    Gait Pattern  Step-through pattern;Decreased arm swing - left;Decreased step length - left;Decreased stance time - left;Decreased trunk rotation;Decreased dorsiflexion - left    Ambulation Surface  Level;Indoor      Standardized Balance Assessment   Standardized Balance Assessment  Timed Up and Go Test;Dynamic Gait Index      Dynamic Gait Index   Level Surface  Normal    Change in Gait Speed  Normal    Gait with Horizontal Head Turns  Mild Impairment    Gait with Vertical Head Turns  Mild Impairment    Gait and Pivot Turn  Normal    Step Over Obstacle  Normal    Step Around Obstacles  Normal    Steps  Normal    Total Score  22      Timed Up and Go Test   TUG  Normal TUG    Normal TUG (seconds)  7.91        Pt reports he has not heard back from Dr Doristine Devoid office concerning neurosurgeon referral.         PT Long Term Goals - 08/30/19 1437      PT LONG TERM GOAL #1   Title  Pt will be independent with HEP for improved transfers, gait, and trunk flexibility/decreased pain.  Updated TARGET for all LTGs 08/30/2019    Baseline  Pt reports not consistently performing HEP.    Time  4    Period  Weeks    Status  Partially Met      PT LONG TERM GOAL #2   Title  Pt will perform 5x sit<>stand to less than or equal to 12.5 seconds for improved functional lower extremity strength.    Baseline  11.69 sec on 08/30/19    Time  4    Period  Weeks    Status  Achieved      PT LONG TERM GOAL #3   Title  Pt will improve 6MWT distance to at least 720 ft indicating improved endurance for  community distances.     Baseline  1346 feet on 08/30/19    Time  4    Period  Weeks    Status  Achieved      PT LONG TERM GOAL #4   Title  Patient will improve DGI score to a 22/24 in order to improve dynamic gait.    Baseline  22/24 on 08/30/19    Time  4    Period  Weeks    Status  Achieved      PT LONG TERM GOAL #5   Title  Pt will verbalize understanding of local Parkinson's disease resources.    Time  4    Period  Weeks    Status  On-going            Plan - 08/30/19 1546    Clinical Impression Statement  Pt met LTG 2,3 and 4.  Goal 1 partially met as pt still not performing consistently and needs revised HEP.  Goal 5 not met.  Pt reports feeling increased benefit from PT and has been performing more consistent mobility over past several sessions.  Pt missed past several sessions due to not feeling well.    Personal Factors and Comorbidities  Comorbidity 3+    Comorbidities  See medical history in subjective (cardiac history)    Examination-Activity Limitations  Stand;Locomotion Level;Transfers    Examination-Participation Restrictions  Yard Work   Acitivites associated with raises puppies   Stability/Clinical Decision Making  Evolving/Moderate complexity    Rehab Potential  Good    PT Frequency  2x / week    PT Duration  4 weeks   per recert [id number]/[id number]   PT Treatment/Interventions  ADLs/Self Care Home Management;Gait training;Functional mobility training;Therapeutic activities;Therapeutic exercise;Balance training;DME Instruction;Neuromuscular re-education;Patient/family education    PT Next Visit Plan  Renewal per Mady Haagensen, PT, for 2x/week for 4 weeks.  Did he hear back from Dr Doristine Devoid office (or call them, as PT gave pt Dr. Doristine Devoid number)? Look at [facility] and revise as needed.  Start discussing optimal community fitness options for pt as plan is to d/c at end of next POC.    PT Home Exercise Plan  [id number] - seated PWR moves  (PWR up and step!)    Consulted and Agree with Plan of Care  Patient        Patient will benefit from skilled therapeutic intervention in order to improve the following deficits and impairments:  Abnormal gait, Decreased coordination, Difficulty walking, Decreased strength, Decreased mobility, Postural dysfunction, Pain, Decreased balance  Visit Diagnosis: Muscle weakness (generalized)  Other abnormalities of gait and mobility  Unsteadiness on feet     Problem List Patient Active Problem List   Diagnosis Date Noted  . Fatigue 01/15/2019  . Chronic systolic (congestive) heart failure ([place]) 09/18/2018  . Ischemic cardiomyopathy 09/16/2018  . Aortic regurgitation 09/16/2018  . Cardiomyopathy, unspecified ([place]) 06/13/2018  . Abscess of right axilla 12/12/2017  . BMI 33.0-33.9,adult 12/12/2017  . 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  . Acute blood loss anemia 10/25/2017  . Acute postoperative respiratory insufficiency 10/25/2017  . Postoperative delirium 10/25/2017  . Dyslipidemia 10/17/2017  . SOB (shortness of breath) 03/31/2017  . Long term current use of aspirin 03/29/2017  . Parkinson's disease ([place]) 01/02/2017  . Morbid (severe) obesity due to excess calories ([place]) 10/27/2015  . Memory change 07/06/2015  . Depression 07/06/2015  . Medial meniscus tear 10/11/2011  . Neuroma of foot 10/11/2011  . Coronary artery disease involving  native coronary artery of native heart with angina pectoris ([place]) 12/28/2010  . 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 Mixed hyperlipidemia 05/20/2010  . Hypertensive heart disease with heart failure ([place]) 05/20/2010  . 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 GERD 05/20/2010  . TRANSIENT ISCHEMIC ATTACK, HX OF 05/20/2010  . NEPHROLITHIASIS, HX OF 05/20/2010  . BENIGN PROSTATIC HYPERTROPHY, HX OF, S/P TURP 05/20/2010   Narda Bonds, PTA [place] 08/30/19 3:53 PM Phone: [phone] Fax: [facility] [address] [place], [place], [address] Phone: [phone]   Fax:  [phone]  Name: JARIAH JARMON MRN: [id number] Date of Birth: Mar 22, 1946

## 2019-09-03 ENCOUNTER — Ambulatory Visit: Payer: Medicare Other | Admitting: Physical Therapy

## 2019-09-06 ENCOUNTER — Encounter: Payer: Self-pay | Admitting: Physical Therapy

## 2019-09-06 ENCOUNTER — Other Ambulatory Visit: Payer: Self-pay

## 2019-09-06 ENCOUNTER — Ambulatory Visit: Payer: Medicare Other | Attending: Neurology | Admitting: Physical Therapy

## 2019-09-06 VITALS — BP 119/68 | HR 91

## 2019-09-06 DIAGNOSIS — M6281 Muscle weakness (generalized): Secondary | ICD-10-CM

## 2019-09-06 DIAGNOSIS — R2689 Other abnormalities of gait and mobility: Secondary | ICD-10-CM

## 2019-09-06 DIAGNOSIS — R2681 Unsteadiness on feet: Secondary | ICD-10-CM | POA: Diagnosis not present

## 2019-09-06 NOTE — Therapy (Addendum)
[facility] [address] [place], [place], [address] Phone: [phone]   Fax:  [phone]  Physical Therapy Treatment  Patient Details  Name: Shawn Meza MRN: [id number] Date of Birth: Jul 04, 1946 Referring Provider (PT): [name], Wells Guiles   Encounter Date: 09/06/2019  PT End of Session - 09/06/19 1640    Visit Number  16    Number of Visits  24  per renewal 09/06/2019   Date for PT Re-Evaluation  10/26/19    Authorization Type  UHC Medicare/Medicaid    PT Start Time  3419    PT Stop Time  1447    PT Time Calculation (min)  43 min    Equipment Utilized During Treatment  Gait belt    Activity Tolerance  Patient limited by fatigue   Pain reports decreased back pain by end of session-rates as a 2/10   Behavior During Therapy  [facility] for tasks assessed/performed       Past Medical History:  Diagnosis Date  . Arthritis   . BPH (benign prostatic hypertrophy)   . Chronic coronary artery disease   . Colon cancer ([place])   . Elevated PSA   . Erectile dysfunction   . Essential hypertension   . Essential tremor   . GERD (gastroesophageal reflux disease)   . Headache(784.0)   . Hiatal hernia   . Hypercholesterolemia   . Long-term use of aspirin therapy   . Major depression, chronic   . Medial meniscus tear 10/11/2011  . Metabolic syndrome   . Morbid obesity ([place])   . Nephrolithiasis    hx of  . NSTEMI (non-ST elevated myocardial infarction) ([place])   . Parkinson's disease ([place])   . S/P CABG (coronary artery bypass graft)   . Transient ischemic attack    hx of  . Trochanteric bursitis of right hip     Past Surgical History:  Procedure Laterality Date  . CARDIAC CATHETERIZATION  5/12,1/13   4 stents placed  . COLON SURGERY    . CORONARY ARTERY BYPASS GRAFT    . KNEE ARTHROSCOPY  10/11/2011   Procedure: ARTHROSCOPY KNEE;  Surgeon: Lorn Junes, MD;  Location: [facility];  Service: Orthopedics;   Laterality: Left;  Left Knee Arthroscopy with Medial and Lateral Partial Menisectomy, Chondroplasty  . LEFT HEART CATHETERIZATION WITH CORONARY ANGIOGRAM N/A 08/25/2011   Procedure: LEFT HEART CATHETERIZATION WITH CORONARY ANGIOGRAM;  Surgeon: Burnell Blanks, MD;  Location: [facility] CATH LAB;  Service: Cardiovascular;  Laterality: N/A;  . LITHOTRIPSY    . STERIOD INJECTION  10/11/2011   Procedure: STEROID INJECTION;  Surgeon: Lorn Junes, MD;  Location: [facility];  Service: Orthopedics;  Laterality: Right;  Steroid Injection Second Toe  . TRANSURETHRAL RESECTION OF PROSTATE    . URETHRAL DILATION      Vitals:   09/06/19 1411  BP: 119/68  Pulse: 91    Subjective Assessment - 09/06/19 1411    Subjective  Pt reports just not feeling well past several weeks.  Continues to have increased tremors and feels nervous and tight.  States like he feels like he may have increased fluid but has been taking diuretic.    Pertinent History  hospitalized 2019 s/p CABG x 5, ischemic cardiomyopathy,CHF, Parkinson's disease x 4 years    Patient Stated Goals  Pt's goal for therapy is to be able to do things without pain and being worn out.    Currently in Pain?  Yes  Pain Score  7     Pain Location  Back    Pain Orientation  Left    Pain Type  Chronic pain    Pain Radiating Towards  towards hip and across back towards right side    Pain Onset  More than a month ago    Pain Frequency  Constant    Pain Onset  More than a month ago    Pain Onset  More than a month ago                       [facility] Adult PT Treatment/Exercise - 09/06/19 0001      Transfers   Transfers  Sit to Stand;Stand to Sit    Sit to Stand  6: Modified independent (Device/Increase time)    Stand to Sit  6: Modified independent (Device/Increase time)    Number of Reps  10 reps      Ambulation/Gait   Ambulation/Gait  Yes    Ambulation/Gait Assistance  5: Supervision    Ambulation Distance (Feet)   --   on treadmill x 3 minutes before needing rest break   Assistive device  Other (Comment)   treadmill   Gait Pattern  Step-through pattern;Decreased arm swing - left;Decreased step length - left;Decreased stance time - left;Decreased trunk rotation;Decreased dorsiflexion - left    Ambulation Surface  Level;Indoor    Gait Comments  Pt on treadmill at 1.7 mph x 3 minutes with bil UE assist of rails.  Pt stated fatigue after 3 minutes and asking to stop.  Pt states he just feels weak llike he has been for past several weeks.      High Level Balance   High Level Balance Activities  Other (comment)   standing at counter for PWR! [place] x 15 reps before rest     Self-Care   Self-Care  Other Self-Care Comments    Other Self-Care Comments   Discussed pt's concerns over weakness past several weeks along with what he describes as similar feelings when he went to see cardiologist in past.  Recommended for pt to contact Dr [name].  Pt did not have his number so provided it to pt.  Pt also asking about COVID vaccine and how to register.  Showed pt how to register for wait list and discussed pt setting up MyChart account (he tried previously without success).        Lumbar Exercises: Aerobic   Other Aerobic Exercise  Scifit level 2.5 all 4 extremities x 9 minutes with rpm 80 with sprints up to 90       Pt needing increased rest breaks today during session.  See vitals taken after 3 minutes on treadmill.        PT Education - 09/06/19 1639    Education Details  calling cardiologist for appointment, calling 911 if having symptoms of heart attack or chest pain    Person(s) Educated  Patient    Methods  Explanation    Comprehension  Verbalized understanding          PT Long Term Goals - 08/30/19 1437      PT LONG TERM GOAL #1   Title  Pt will be independent with HEP for improved transfers, gait, and trunk flexibility/decreased pain.  Updated TARGET for all LTGs 08/30/2019    Baseline  Pt  reports not consistently performing HEP.    Time  4    Period  Weeks    Status  Partially Met      PT LONG TERM GOAL #2   Title  Pt will perform 5x sit<>stand to less than or equal to 12.5 seconds for improved functional lower extremity strength.    Baseline  11.69 sec on 08/30/19    Time  4    Period  Weeks    Status  Achieved      PT LONG TERM GOAL #3   Title  Pt will improve 6MWT distance to at least 720 ft indicating improved endurance for community distances.    Baseline  1346 feet on 08/30/19    Time  4    Period  Weeks    Status  Achieved      PT LONG TERM GOAL #4   Title  Patient will improve DGI score to a 22/24 in order to improve dynamic gait.    Baseline  22/24 on 08/30/19    Time  4    Period  Weeks    Status  Achieved      PT LONG TERM GOAL #5   Title  Pt will verbalize understanding of local Parkinson's disease resources.    Time  4    Period  Weeks    Status  On-going            Plan - 09/06/19 1640    Clinical Impression Statement  Skilled session focused on exercises for endurance and gait.  Pt continues to c/o not feeling well and reports feeling like he has increased fluid (has CHF per history).  Pt also continues with decreased mobility outside of therapy and c/o increased tremors, increased tightness and back pain.  Will discuss with Mady Haagensen, PT. Addendum:  Pt has met objective measures for improved functional mobility.  However, he has not fully met goals for updates to HEP and for transition to community fitness.  He will continue to benefit from skilled PT to work towards these ongoing goals.   Personal Factors and Comorbidities  Comorbidity 3+    Comorbidities  See medical history in subjective (cardiac history)    Examination-Activity Limitations  Stand;Locomotion Level;Transfers    Examination-Participation Restrictions  Yard Work   Acitivites associated with raises puppies   Stability/Clinical Decision Making  Evolving/Moderate complexity     Rehab Potential  Good    PT Frequency  2x / week    PT Duration  4 weeks   per recert 10/03/7423, to include 09/06/2019 visit   PT Treatment/Interventions  ADLs/Self Care Home Management;Gait training;Functional mobility training;Therapeutic activities;Therapeutic exercise;Balance training;DME Instruction;Neuromuscular re-education;Patient/family education    PT Next Visit Plan  Has he made appt with Dr Debby Freiberg?  Did he hear back from Dr Doristine Devoid office (or call them, as PT gave pt Dr. Doristine Devoid number)? Look at [facility] and revise as needed.  Start discussing optimal community fitness options for pt as plan is to d/c at end of next POC.    PT Home Exercise Plan  [email or web address] - seated PWR moves  (PWR up and step!)    Consulted and Agree with Plan of Care  Patient       Patient will benefit from skilled therapeutic intervention in order to improve the following deficits and impairments:  Abnormal gait, Decreased coordination, Difficulty walking, Decreased strength, Decreased mobility, Postural dysfunction, Pain, Decreased balance  Visit Diagnosis: Muscle weakness (generalized)  Other abnormalities of gait and mobility  Unsteadiness on feet     Problem List Patient Active Problem List   Diagnosis Date Noted  .  Fatigue 01/15/2019  . Chronic systolic (congestive) heart failure ([place]) 09/18/2018  . Ischemic cardiomyopathy 09/16/2018  . Aortic regurgitation 09/16/2018  . Cardiomyopathy, unspecified ([place]) 06/13/2018  . Abscess of right axilla 12/12/2017  . BMI 33.0-33.9,adult 12/12/2017  . 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  . Acute blood loss anemia 10/25/2017  . Acute postoperative respiratory insufficiency 10/25/2017  . Postoperative delirium 10/25/2017  . Dyslipidemia 10/17/2017  . SOB (shortness of breath) 03/31/2017  . Long term current use of aspirin 03/29/2017  . Parkinson's disease ([place]) 01/02/2017  . Morbid (severe) obesity due to excess calories ([place]) 10/27/2015  . Memory  change 07/06/2015  . Depression 07/06/2015  . Medial meniscus tear 10/11/2011  . Neuroma of foot 10/11/2011  . Coronary artery disease involving native coronary artery of native heart with angina pectoris ([place]) 12/28/2010  . 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 Mixed hyperlipidemia 05/20/2010  . Hypertensive heart disease with heart failure ([place]) 05/20/2010  . 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 GERD 05/20/2010  . TRANSIENT ISCHEMIC ATTACK, HX OF 05/20/2010  . NEPHROLITHIASIS, HX OF 05/20/2010  . BENIGN PROSTATIC HYPERTROPHY, HX OF, S/P TURP 05/20/2010    Narda Bonds, PTA [place] 09/06/19 4:44 PM Phone: [phone] Fax: [place] [facility] [address] [place], [place], [address] Phone: [phone]   Fax:  [phone]  Name: Shawn Meza MRN: [id number] Date of Birth: 09-13-1945   Mady Haagensen, PT 09/10/19 12:04 PM Phone: [phone] Fax: [phone]   Updated LTGs for recert: PT Long Term Goals - 09/10/19 1200      PT LONG TERM GOAL #1   Title  Pt will be independent with HEP for improved transfers, gait, and trunk flexibility/decreased pain.  ONGOING GOAL:  target 09/27/2019    Baseline  Pt reports not consistently performing HEP.    Time  4    Period  Weeks    Status  On-going      PT LONG TERM GOAL #5   Title  Pt will verbalize understanding of local Parkinson's disease resources, including community fitness    Time  4    Period  Weeks    Status  On-going      Mady Haagensen, [place] 09/10/19 12:04 PM Phone: [phone] Fax: [phone]

## 2019-09-10 ENCOUNTER — Ambulatory Visit: Payer: Medicare Other | Admitting: Physical Therapy

## 2019-09-10 ENCOUNTER — Other Ambulatory Visit: Payer: Self-pay

## 2019-09-10 ENCOUNTER — Encounter: Payer: Self-pay | Admitting: Physical Therapy

## 2019-09-10 DIAGNOSIS — M6281 Muscle weakness (generalized): Secondary | ICD-10-CM

## 2019-09-10 DIAGNOSIS — R2681 Unsteadiness on feet: Secondary | ICD-10-CM | POA: Diagnosis not present

## 2019-09-10 DIAGNOSIS — R2689 Other abnormalities of gait and mobility: Secondary | ICD-10-CM | POA: Diagnosis not present

## 2019-09-10 NOTE — Therapy (Signed)
[facility] [address] [place], [place], [address] Phone: [phone]   Fax:  [phone]  Physical Therapy Treatment  Patient Details  Name: Shawn Meza MRN: [id number] Date of Birth: 09-15-1945 Referring Provider (PT): [name], Wells Guiles   Encounter Date: 09/10/2019  PT End of Session - 09/10/19 1436    Visit Number  17    Number of Visits  24   per recert [id number]   Date for PT Re-Evaluation  11/04/19    Authorization Type  UHC Medicare/Medicaid    PT Start Time  1146    PT Stop Time  1230    PT Time Calculation (min)  44 min    Activity Tolerance  Patient tolerated treatment well    Behavior During Therapy  [facility] for tasks assessed/performed       Past Medical History:  Diagnosis Date  . Arthritis   . BPH (benign prostatic hypertrophy)   . Chronic coronary artery disease   . Colon cancer ([place])   . Elevated PSA   . Erectile dysfunction   . Essential hypertension   . Essential tremor   . GERD (gastroesophageal reflux disease)   . Headache(784.0)   . Hiatal hernia   . Hypercholesterolemia   . Long-term use of aspirin therapy   . Major depression, chronic   . Medial meniscus tear 10/11/2011  . Metabolic syndrome   . Morbid obesity ([place])   . Nephrolithiasis    hx of  . NSTEMI (non-ST elevated myocardial infarction) ([place])   . Parkinson's disease ([place])   . S/P CABG (coronary artery bypass graft)   . Transient ischemic attack    hx of  . Trochanteric bursitis of right hip     Past Surgical History:  Procedure Laterality Date  . CARDIAC CATHETERIZATION  5/12,1/13   4 stents placed  . COLON SURGERY    . CORONARY ARTERY BYPASS GRAFT    . KNEE ARTHROSCOPY  10/11/2011   Procedure: ARTHROSCOPY KNEE;  Surgeon: Lorn Junes, MD;  Location: [facility];  Service: Orthopedics;  Laterality: Left;  Left Knee Arthroscopy with Medial and Lateral Partial Menisectomy, Chondroplasty  . LEFT HEART  CATHETERIZATION WITH CORONARY ANGIOGRAM N/A 08/25/2011   Procedure: LEFT HEART CATHETERIZATION WITH CORONARY ANGIOGRAM;  Surgeon: Burnell Blanks, MD;  Location: [facility] CATH LAB;  Service: Cardiovascular;  Laterality: N/A;  . LITHOTRIPSY    . STERIOD INJECTION  10/11/2011   Procedure: STEROID INJECTION;  Surgeon: Lorn Junes, MD;  Location: [facility];  Service: Orthopedics;  Laterality: Right;  Steroid Injection Second Toe  . TRANSURETHRAL RESECTION OF PROSTATE    . URETHRAL DILATION      There were no vitals filed for this visit.  Subjective Assessment - 09/10/19 1150    Subjective  Feeling stiff today. Felt really good on the way over here. Was not able to get into the cardiologist - they said that he should go to urgent care. Urgent care states that he is getting shortness of breath due to the kind of mask he is wearing. Feeling a whole lot better than he did on Friday.    Pertinent History  hospitalized 2019 s/p CABG x 5, ischemic cardiomyopathy,CHF, Parkinson's disease x 4 years    Patient Stated Goals  Pt's goal for therapy is to be able to do things without pain and being worn out.    Currently in Pain?  Yes    Pain Score  6  Pain Location  Back    Pain Orientation  Left    Pain Descriptors / Indicators  Squeezing    Pain Type  Chronic pain    Pain Onset  More than a month ago    Pain Relieving Factors  better when he was walking more, sitting    Pain Onset  More than a month ago    Pain Onset  More than a month ago                       [facility] Adult PT Treatment/Exercise - 09/10/19 1205      Ambulation/Gait   Ambulation/Gait  Yes    Ambulation/Gait Assistance  5: Supervision    Ambulation/Gait Assistance Details  initial cues for arm swing.     Ambulation Distance (Feet)  345 Feet    Assistive device  None    Gait Pattern  Step-through pattern;Decreased arm swing - left;Decreased step length - left;Decreased stance time -  left;Decreased trunk rotation;Decreased dorsiflexion - left    Ambulation Surface  Level;Indoor      High Level Balance   High Level Balance Comments  at end of session pt reporting practicing his balance at home by standing on one leg to take his pants on/off - therapist educating pt to perform this while seated/holding onto object to decr risk of falls, instead will practice balance at next session in a safe environment and add balance exercises to HEP       Exercises   Exercises  Other Exercises    Other Exercises   Scifit level 3.0 with BLE and BUE for endurance, ROM, and strengthening x6 minutes. HR 95 bpm and O2 94% afterwards.       Lumbar Exercises: Stretches   Other Lumbar Stretch Exercise  pt reports that instead of placing tennis ball at L low back against the wall, instead he has been using his L fist (putting shoulder in increased IR) to perform. therapist educating pt that this would not be the best continuous position for L shoulder with incr pressure from pt rubbing his low back up/down. Instead trialed placing the tennis ball under pt's L low back in hookyling position and pt performing slow lower trunk rotations, pt reporting feeling relief and decr in back pain. Educated pt to perform using this method and provided tennis ball for home        PWR [facility]) - 09/10/19 1440    PWR! exercises  Moves in sitting    PWR! Up  reviewed HEP 1 x 10 reps, pt reporting decr in back pain after performing     PWR! Step  reviewed HEP - cues for intensity 1 x 10 reps        Access Code: [email or web address]  URL: [email or web address]  Date: 09/10/2019  Prepared by: Janann August   Reviewed bolded exercises below:   Exercises Proper Sit to Stand Technique - 5 reps - 3 sets - 1x daily - 7x weekly Seated Hamstring Stretch - 3 sets - 30 hold - 2x daily - 7x weekly - cues for proper technique, sitting up tall before bending over to stretch  Lower Trunk Rotations - 10 reps - 2 sets -  1x daily - 7x weekly Seated Lumbar Flexion Stretch - 10 reps - 2 sets - 2x daily - 7x weekly Hooklying Single Knee to Chest Stretch - 10 reps - 2 sets - 2x daily - 7x weekly Seated Pelvic Tilt - 10 reps -  2 sets - 1x daily - 7x weekly Staggered Stance Forward Backward Weight Shift with Counter Support - 10 reps - 1 sets - 1x daily - 5x weekly Side to side weightshift - 10 reps - 1 sets - 1x daily - 5x weekly Supine Shoulder Flexion with Dowel - 10 reps - 1 sets - 1x daily - 7x weekly Standing Shoulder Flexion AAROM with Dowel - 10 reps - 1 sets - 1x daily - 7x weekly Standing Shoulder Flexion Wall Walk - 10 reps - 1 sets - 1x daily - 7x weekly    PT Education - 09/10/19 1436    Education Details  making appt with cardiologist, reviewing HEP    Person(s) Educated  Patient    Methods  Explanation;Demonstration    Comprehension  Verbalized understanding;Returned demonstration          PT Long Term Goals - 09/10/19 1200      PT LONG TERM GOAL #1   Title  Pt will be independent with HEP for improved transfers, gait, and trunk flexibility/decreased pain.  ONGOING GOAL:  target 09/27/2019    Baseline  Pt reports not consistently performing HEP.    Time  4    Period  Weeks    Status  On-going      PT LONG TERM GOAL #5   Title  Pt will verbalize understanding of local Parkinson's disease resources, including community fitness    Time  4    Period  Weeks    Status  On-going            Plan - 09/10/19 1437    Clinical Impression Statement  Skilled session focused on reveiwing pt's HEP (low back stretches and PWR! moves), gait training, and endurance/LE strengthening. Pt reporting feeling much better today than previous session, did not go to the cardiologist - went to urgent care and said his difficulty with breathing was because of his face mask. Therapist educated pt to still make future appt with cardiologist for check-up. Educated importance of performing HEP at home with pt  needing intermittent cues for technique. Will continue to progress towards LTGs.    Personal Factors and Comorbidities  Comorbidity 3+    Comorbidities  See medical history in subjective (cardiac history)    Examination-Activity Limitations  Stand;Locomotion Level;Transfers    Examination-Participation Restrictions  Yard Work   Acitivites associated with raises puppies   Stability/Clinical Decision Making  Evolving/Moderate complexity    Rehab Potential  Good    PT Frequency  2x / week    PT Duration  4 weeks   per recert [id number], to include 09/06/2019 visit   PT Treatment/Interventions  ADLs/Self Care Home Management;Gait training;Functional mobility training;Therapeutic activities;Therapeutic exercise;Balance training;DME Instruction;Neuromuscular re-education;Patient/family education    PT Next Visit Plan  add standing balance (SLS, tandem stance) to HEP. Has he made appt with Dr Debby Freiberg?  Did he hear back from Dr Doristine Devoid office (or call them, as PT gave pt Dr. Doristine Devoid number)? Look at [facility] and revise as needed.  Start discussing optimal community fitness options for pt as plan is to d/c at end of next POC.   See recert; continue LTGs for HEP and community fitness   PT Home Exercise Plan  [email or web address] - seated PWR moves  (PWR up and step!)    Consulted and Agree with Plan of Care  Patient       Patient will benefit from skilled therapeutic intervention in order to improve the following deficits and impairments:  Abnormal gait, Decreased coordination, Difficulty walking, Decreased strength, Decreased mobility, Postural dysfunction, Pain, Decreased balance  Visit Diagnosis: Muscle weakness (generalized)  Unsteadiness on feet  Other abnormalities of gait and mobility     Problem List Patient Active Problem List   Diagnosis Date Noted  . Fatigue 01/15/2019  . Chronic systolic (congestive) heart failure ([place]) 09/18/2018  . Ischemic cardiomyopathy 09/16/2018  . Aortic regurgitation 09/16/2018   . Cardiomyopathy, unspecified ([place]) 06/13/2018  . Abscess of right axilla 12/12/2017  . BMI 33.0-33.9,adult 12/12/2017  . 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  . Acute blood loss anemia 10/25/2017  . Acute postoperative respiratory insufficiency 10/25/2017  . Postoperative delirium 10/25/2017  . Dyslipidemia 10/17/2017  . SOB (shortness of breath) 03/31/2017  . Long term current use of aspirin 03/29/2017  . Parkinson's disease ([place]) 01/02/2017  . Morbid (severe) obesity due to excess calories ([place]) 10/27/2015  . Memory change 07/06/2015  . Depression 07/06/2015  . Medial meniscus tear 10/11/2011  . Neuroma of foot 10/11/2011  . Coronary artery disease involving native coronary artery of native heart with angina pectoris ([place]) 12/28/2010  . 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 Mixed hyperlipidemia 05/20/2010  . Hypertensive heart disease with heart failure ([place]) 05/20/2010  . 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 GERD 05/20/2010  . TRANSIENT ISCHEMIC ATTACK, HX OF 05/20/2010  . NEPHROLITHIASIS, HX OF 05/20/2010  . BENIGN PROSTATIC HYPERTROPHY, HX OF, S/P TURP 05/20/2010    Arliss Journey, PT,DPT  09/10/2019, 2:45 PM  [facility] [address] [place], [place], [address] Phone: [phone]   Fax:  [phone]  Name: Shawn Meza MRN: [id number] Date of Birth: 15-Dec-1945

## 2019-09-10 NOTE — Addendum Note (Signed)
Addended by: Frazier Butt on: 09/10/2019 12:06 PM   Modules accepted: Orders

## 2019-09-13 ENCOUNTER — Other Ambulatory Visit: Payer: Self-pay

## 2019-09-13 ENCOUNTER — Encounter: Payer: Self-pay | Admitting: Physical Therapy

## 2019-09-13 ENCOUNTER — Ambulatory Visit: Payer: Medicare Other | Admitting: Physical Therapy

## 2019-09-13 DIAGNOSIS — M6281 Muscle weakness (generalized): Secondary | ICD-10-CM | POA: Diagnosis not present

## 2019-09-13 DIAGNOSIS — R2689 Other abnormalities of gait and mobility: Secondary | ICD-10-CM

## 2019-09-13 DIAGNOSIS — R2681 Unsteadiness on feet: Secondary | ICD-10-CM

## 2019-09-13 NOTE — Therapy (Signed)
[facility] [address] [place], [place], [address] Phone: [phone]   Fax:  [phone]  Physical Therapy Treatment  Patient Details  Name: Shawn Meza MRN: [id number] Date of Birth: Jan 03, 1946 Referring Provider (PT): [name], Wells Guiles   Encounter Date: 09/13/2019  PT End of Session - 09/13/19 1547    Visit Number  18    Number of Visits  24   per recert [id number]   Date for PT Re-Evaluation  11/04/19    Authorization Type  UHC Medicare/Medicaid    PT Start Time  1315    PT Stop Time  1400    PT Time Calculation (min)  45 min    Activity Tolerance  Patient tolerated treatment well    Behavior During Therapy  [facility] for tasks assessed/performed       Past Medical History:  Diagnosis Date  . Arthritis   . BPH (benign prostatic hypertrophy)   . Chronic coronary artery disease   . Colon cancer ([place])   . Elevated PSA   . Erectile dysfunction   . Essential hypertension   . Essential tremor   . GERD (gastroesophageal reflux disease)   . Headache(784.0)   . Hiatal hernia   . Hypercholesterolemia   . Long-term use of aspirin therapy   . Major depression, chronic   . Medial meniscus tear 10/11/2011  . Metabolic syndrome   . Morbid obesity ([place])   . Nephrolithiasis    hx of  . NSTEMI (non-ST elevated myocardial infarction) ([place])   . Parkinson's disease ([place])   . S/P CABG (coronary artery bypass graft)   . Transient ischemic attack    hx of  . Trochanteric bursitis of right hip     Past Surgical History:  Procedure Laterality Date  . CARDIAC CATHETERIZATION  5/12,1/13   4 stents placed  . COLON SURGERY    . CORONARY ARTERY BYPASS GRAFT    . KNEE ARTHROSCOPY  10/11/2011   Procedure: ARTHROSCOPY KNEE;  Surgeon: Lorn Junes, MD;  Location: [place] [facility];  Service: Orthopedics;  Laterality: Left;  Left Knee Arthroscopy with Medial and Lateral Partial Menisectomy, Chondroplasty  . LEFT HEART  CATHETERIZATION WITH CORONARY ANGIOGRAM N/A 08/25/2011   Procedure: LEFT HEART CATHETERIZATION WITH CORONARY ANGIOGRAM;  Surgeon: Burnell Blanks, MD;  [facility] CATH LAB;  Service: Cardiovascular;  Laterality: N/A;  . LITHOTRIPSY    . STERIOD INJECTION  10/11/2011   Procedure: STEROID INJECTION;  Surgeon: Lorn Junes, MD;  Location: [place];  Service: Orthopedics;  Laterality: Right;  Steroid Injection Second Toe  . TRANSURETHRAL RESECTION OF PROSTATE    . URETHRAL DILATION      There were no vitals filed for this visit.  Subjective Assessment - 09/13/19 1539    Subjective  Has not been very active due to weather.  Did do some walking in [employer] one day.  Still has not called Dr [name]    Pertinent History  hospitalized 2019 s/p CABG x 5, ischemic cardiomyopathy,CHF, Parkinson's disease x 4 years    Patient Stated Goals  Pt's goal for therapy is to be able to do things without pain and being worn out.    Currently in Pain?  Yes    Pain Score  4     Pain Location  Back    Pain Orientation  Right    Pain Descriptors / Indicators  Aching    Pain Type  Chronic pain  Pain Onset  More than a month ago    Pain Frequency  Intermittent    Pain Onset  More than a month ago    Pain Onset  More than a month ago            [facility] Adult PT Treatment/Exercise - 09/13/19 0001      Transfers   Transfers  Sit to Stand;Stand to Sit    Sit to Stand  6: Modified independent (Device/Increase time)    Stand to Sit  6: Modified independent (Device/Increase time)      Ambulation/Gait   Ambulation/Gait  Yes    Ambulation/Gait Assistance  5: Supervision    Ambulation/Gait Assistance Details  cues for armswing on the right along with heel strike and increased cadence    Ambulation Distance (Feet)  450 Feet   plus   Assistive device  None    Gait Pattern  Step-through pattern;Decreased arm swing - left;Decreased step length - left;Decreased stance time - left;Decreased  trunk rotation;Decreased dorsiflexion - left    Ambulation Surface  Level;Indoor      Posture/Postural Control   Posture/Postural Control  Postural limitations    Postural Limitations  Forward head;Rounded Shoulders    Posture Comments  L shoulder lower than R      Self-Care   Self-Care  Other Self-Care Comments    Other Self-Care Comments   Discussed importance of taking medication (mainly Sinemet) as prescribed and consistently.  Discussed not eating 30 minutes before and if he does it need to not contain protein.  Pt reports various times for taking his first dose (at times a 5 hour difference from one day to next).  Pt also reports not remembering to take afternoon dose.  Pt reports that his church has an equipment room and walking track but not sure it is open yet.  Asked pt to call church to find out for sure.      Neuro Re-ed    Neuro Re-ed Details   Standing in corner performing reach to opposite wall x 10 each side then PWR! Twist x 10 each side.  Pt unable to fully reach opposite arm even when standing away from wall with less ROM.  Standing at counter to [facility]! rock with UE's reaching to cabinets      Exercises   Exercises  Knee/Hip;Other Exercises      Lumbar Exercises: Stretches   Single Knee to Chest Stretch  Right;Left;10 seconds;1 rep    Lower Trunk Rotation  3 reps;30 seconds      Lumbar Exercises: Aerobic   Other Aerobic Exercise  Scifit level 3 all 4 extremties x 10 minutes working on rpm        PT Education - 09/13/19 1546    Education Details  Importance of continued mobility/activity    Person(s) Educated  Patient    Methods  Explanation    Comprehension  Need further instruction;Verbalized understanding          PT Long Term Goals - 09/10/19 1200      PT LONG TERM GOAL #1   Title  Pt will be independent with HEP for improved transfers, gait, and trunk flexibility/decreased pain.  ONGOING GOAL:  target 09/27/2019    Baseline  Pt reports not consistently  performing HEP.    Time  4    Period  Weeks    Status  On-going      PT LONG TERM GOAL #5   Title  Pt will verbalize  understanding of local Parkinson's disease resources, including community fitness    Time  4    Period  Weeks    Status  On-going            Plan - 09/13/19 1547    Clinical Impression Statement  Skilled session focused on gait, endurance, stretching and strengthening.  Pt continues to report decreased activity outside of therapy and has multiple "reasons" why.  Continue to reinforce working on an optimal fitness plan for d/c.  Continue PT per POC.    Personal Factors and Comorbidities  Comorbidity 3+    Comorbidities  See medical history in subjective (cardiac history)    Examination-Activity Limitations  Stand;Locomotion Level;Transfers    Examination-Participation Restrictions  Yard Work   Acitivites associated with raises puppies   Stability/Clinical Decision Making  Evolving/Moderate complexity    Rehab Potential  Good    PT Frequency  2x / week    PT Duration  4 weeks   per recert [id number], to include 09/06/2019 visit   PT Treatment/Interventions  ADLs/Self Care Home Management;Gait training;Functional mobility training;Therapeutic activities;Therapeutic exercise;Balance training;DME Instruction;Neuromuscular re-education;Patient/family education    PT Next Visit Plan  add standing balance (SLS, tandem stance) to HEP. Has he made appt with Dr Debby Freiberg?  Did he hear back from Dr Doristine Devoid office (or call them, as PT gave pt Dr. Doristine Devoid number)? Look at [facility] and revise as needed.  Start discussing optimal community fitness options for pt as plan is to d/c at end of next POC.   See recert; continue LTGs for HEP and community fitness   PT Home Exercise Plan  [email or web address] - seated PWR moves  (PWR up and step!)    Consulted and Agree with Plan of Care  Patient       Patient will benefit from skilled therapeutic intervention in order to improve the following deficits and  impairments:  Abnormal gait, Decreased coordination, Difficulty walking, Decreased strength, Decreased mobility, Postural dysfunction, Pain, Decreased balance  Visit Diagnosis: Muscle weakness (generalized)  Unsteadiness on feet  Other abnormalities of gait and mobility     Problem List Patient Active Problem List   Diagnosis Date Noted  . Fatigue 01/15/2019  . Chronic systolic (congestive) heart failure ([place]) 09/18/2018  . Ischemic cardiomyopathy 09/16/2018  . Aortic regurgitation 09/16/2018  . Cardiomyopathy, unspecified ([place]) 06/13/2018  . Abscess of right axilla 12/12/2017  . BMI 33.0-33.9,adult 12/12/2017  . 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  . Acute blood loss anemia 10/25/2017  . Acute postoperative respiratory insufficiency 10/25/2017  . Postoperative delirium 10/25/2017  . Dyslipidemia 10/17/2017  . SOB (shortness of breath) 03/31/2017  . Long term current use of aspirin 03/29/2017  . Parkinson's disease ([place]) 01/02/2017  . Morbid (severe) obesity due to excess calories ([place]) 10/27/2015  . Memory change 07/06/2015  . Depression 07/06/2015  . Medial meniscus tear 10/11/2011  . Neuroma of foot 10/11/2011  . Coronary artery disease involving native coronary artery of native heart with angina pectoris ([place]) 12/28/2010  . 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 Mixed hyperlipidemia 05/20/2010  . Hypertensive heart disease with heart failure ([place]) 05/20/2010  . 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 GERD 05/20/2010  . TRANSIENT ISCHEMIC ATTACK, HX OF 05/20/2010  . NEPHROLITHIASIS, HX OF 05/20/2010  . BENIGN PROSTATIC HYPERTROPHY, HX OF, S/P TURP 05/20/2010    Narda Bonds, PTA [place] 09/13/19 3:53 PM Phone: [phone] Fax: [facility] [address] [place], [place], [address] Phone: [phone]   Fax:   [phone]  Name: DEONDRICK HASELEY MRN: [id number] Date of Birth: 06/27/1946

## 2019-09-17 ENCOUNTER — Ambulatory Visit: Payer: Medicare Other | Admitting: Physical Therapy

## 2019-09-17 ENCOUNTER — Other Ambulatory Visit: Payer: Self-pay

## 2019-09-17 DIAGNOSIS — R2681 Unsteadiness on feet: Secondary | ICD-10-CM

## 2019-09-17 DIAGNOSIS — M6281 Muscle weakness (generalized): Secondary | ICD-10-CM

## 2019-09-17 DIAGNOSIS — R2689 Other abnormalities of gait and mobility: Secondary | ICD-10-CM | POA: Diagnosis not present

## 2019-09-19 NOTE — Therapy (Signed)
[facility] [address] [place], [place], [address] Phone: [phone]   Fax:  [phone]  Physical Therapy Treatment  Patient Details  Name: Shawn Meza MRN: [id number] Date of Birth: April 05, 1946 Referring Provider (PT): [name], Wells Guiles   Encounter Date: 09/17/2019  PT End of Session - 09/19/19 1055    Visit Number  19    Number of Visits  24   per recert [id number]   Date for PT Re-Evaluation  11/04/19    Authorization Type  UHC Medicare/Medicaid    PT Start Time  [id number]    PT Stop Time  1829    PT Time Calculation (min)  43 min    Activity Tolerance  Patient tolerated treatment well    Behavior During Therapy  [facility] for tasks assessed/performed       Past Medical History:  Diagnosis Date  . Arthritis   . BPH (benign prostatic hypertrophy)   . Chronic coronary artery disease   . Colon cancer ([place])   . Elevated PSA   . Erectile dysfunction   . Essential hypertension   . Essential tremor   . GERD (gastroesophageal reflux disease)   . Headache(784.0)   . Hiatal hernia   . Hypercholesterolemia   . Long-term use of aspirin therapy   . Major depression, chronic   . Medial meniscus tear 10/11/2011  . Metabolic syndrome   . Morbid obesity ([place])   . Nephrolithiasis    hx of  . NSTEMI (non-ST elevated myocardial infarction) ([place])   . Parkinson's disease ([place])   . S/P CABG (coronary artery bypass graft)   . Transient ischemic attack    hx of  . Trochanteric bursitis of right hip     Past Surgical History:  Procedure Laterality Date  . CARDIAC CATHETERIZATION  5/12,1/13   4 stents placed  . COLON SURGERY    . CORONARY ARTERY BYPASS GRAFT    . KNEE ARTHROSCOPY  10/11/2011   Procedure: ARTHROSCOPY KNEE;  Surgeon: Lorn Junes, MD;  Location: [facility];  Service: Orthopedics;  Laterality: Left;  Left Knee Arthroscopy with Medial and Lateral Partial Menisectomy, Chondroplasty  . LEFT HEART  CATHETERIZATION WITH CORONARY ANGIOGRAM N/A 08/25/2011   Procedure: LEFT HEART CATHETERIZATION WITH CORONARY ANGIOGRAM;  Surgeon: Burnell Blanks, MD;  [facility];  Service: Cardiovascular;  Laterality: N/A;  . LITHOTRIPSY    . STERIOD INJECTION  10/11/2011   Procedure: STEROID INJECTION;  Surgeon: Lorn Junes, MD;  Location: [facility];  Service: Orthopedics;  Laterality: Right;  Steroid Injection Second Toe  . TRANSURETHRAL RESECTION OF PROSTATE    . URETHRAL DILATION      There were no vitals filed for this visit.      09/17/19 1748  Symptoms/Limitations  Subjective Pulled a muscle when he was trying to crank the generator on Saturday. Every 2-3 weeks sometimes his legs get numb and then he needs to sit down. Has been feeling more tired.  Pertinent History hospitalized 2019 s/p CABG x 5, ischemic cardiomyopathy,CHF, Parkinson's disease x 4 years  Patient Stated Goals Pt's goal for therapy is to be able to do things without pain and being worn out.  Pain Assessment  Currently in Pain? Yes  Pain Score 7  Pain Location Back (low back)  Pain Orientation Left  Pain Descriptors / Indicators  ("grabbing")  Pain Type Acute pain  Pain Onset More than a month ago  Aggravating Factors  getting up  Pain Relieving Factors laying down, going to sleep  2nd Pain Site  Pain Onset More than a month ago  3rd Pain Site  Pain Onset More than a month ago          09/19/19 0001  Transfers  Transfers Sit to Stand;Stand to Sit  Sit to Stand 5: Supervision  Stand to Sit 5: Supervision  Comments 1 x 10 reps with wide BOS on blue foam beam   Ambulation/Gait  Ambulation/Gait Yes  Ambulation/Gait Assistance 5: Supervision  Ambulation/Gait Assistance Details initial cues for arm swing and trunk rotation - pt reports decr back pain after performing   Ambulation Distance (Feet) 575 Feet (3 laps at beginning of session, 2 at end)  Assistive device None  Gait  Pattern Step-through pattern;Decreased arm swing - left;Decreased step length - left;Decreased stance time - left;Decreased trunk rotation;Decreased dorsiflexion - left  Ambulation Surface Level;Indoor  Gait Comments discussed importance of increased mobility and walking at home          09/17/19 1819  Balance Exercises: Standing  SLS with Vectors Foam/compliant surface;Limitations  SLS with Vectors Limitations at staircase alternating SLS taps to 1st 6 inch step - progressing to holds for a couple of seconds on step for SLS 1 x 10 reps   Stepping Strategy Anterior;Posterior;Foam/compliant surface;10 reps;Limitations  Stepping Strategy Limitations standing on blue foam beam at countertop, 1 x 10 reps forward stepping strategy with single UE support, 1 x 10 reps posterior stepping strategy with single UE support progressing to 1 x 10 reps posterior stepping strategy with no UE support - cues for wider BOS when returning back to midline   Other Standing Exercises at countertop: standing PWR! rock on blue foam beam 1 x 10 reps, cues for weight shift, without full ROM due to shoulder discomfort         Access Code: [email or web address]  URL: [email or web address]  Date: 09/19/2019  Prepared by: Janann [place]   Exercises New balance additions to pt's HEP:  Tandem Walking with Counter Support - 3 sets - 1x daily - 5x weekly Standing Single Leg Stance with Counter Support - 3 sets - 5-10 hold - 2x daily - 5x weekly            PT Education - 09/19/19 1054    Education Details  importance of continued mobility - walking around his house to help decr back pain    Person(s) Educated  Patient    Methods  Explanation;Demonstration    Comprehension  Verbalized understanding          PT Long Term Goals - 09/10/19 1200      PT LONG TERM GOAL #1   Title  Pt will be independent with HEP for improved transfers, gait, and trunk flexibility/decreased pain.  ONGOING GOAL:  target  09/27/2019    Baseline  Pt reports not consistently performing HEP.    Time  4    Period  Weeks    Status  On-going      PT LONG TERM GOAL #5   Title  Pt will verbalize understanding of local Parkinson's disease resources, including community fitness    Time  4    Period  Weeks    Status  On-going            Plan - 09/19/19 1057    Clinical Impression Statement  Skilled session focused on gait, functional LE strengthening, and balance strategies on compliant surfaces. Pt reporting a decrease  in back pain after ambulating a couple laps in the clinic with reciprocal arm swing and incr step length. Continued to educate on importance of performing exercises and increasing mobility at home. Added standing balance to pt's HEP at countertop with pt able to demo with proper form. Will continue to progress towards LTGs to work on optimal fitness plan.    Personal Factors and Comorbidities  Comorbidity 3+    Comorbidities  See medical history in subjective (cardiac history)    Examination-Activity Limitations  Stand;Locomotion Level;Transfers    Examination-Participation Restrictions  Yard Work   Acitivites associated with raises puppies   Stability/Clinical Decision Making  Evolving/Moderate complexity    Rehab Potential  Good    PT Frequency  2x / week    PT Duration  4 weeks   per recert [id number], to include 09/06/2019 visit   PT Treatment/Interventions  ADLs/Self Care Home Management;Gait training;Functional mobility training;Therapeutic activities;Therapeutic exercise;Balance training;DME Instruction;Neuromuscular re-education;Patient/family education    PT Next Visit Plan  Has he made appt with Dr Debby Freiberg?  Did he hear back from Dr Doristine Devoid office (or call them, as PT gave pt Dr. Doristine Devoid number)? Look at [facility] and revise as needed.  Start discussing optimal community fitness options for pt as plan is to d/c at end of next POC.   See recert; continue LTGs for HEP and community fitness   PT Home  Exercise Plan  [email or web address] - seated PWR moves  (PWR up and step!)    Consulted and Agree with Plan of Care  Patient       Patient will benefit from skilled therapeutic intervention in order to improve the following deficits and impairments:  Abnormal gait, Decreased coordination, Difficulty walking, Decreased strength, Decreased mobility, Postural dysfunction, Pain, Decreased balance  Visit Diagnosis: Unsteadiness on feet  Other abnormalities of gait and mobility  Muscle weakness (generalized)     Problem List Patient Active Problem List   Diagnosis Date Noted  . Fatigue 01/15/2019  . Chronic systolic (congestive) heart failure ([place]) 09/18/2018  . Ischemic cardiomyopathy 09/16/2018  . Aortic regurgitation 09/16/2018  . Cardiomyopathy, unspecified ([place]) 06/13/2018  . Abscess of right axilla 12/12/2017  . BMI 33.0-33.9,adult 12/12/2017  . 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  . Acute blood loss anemia 10/25/2017  . Acute postoperative respiratory insufficiency 10/25/2017  . Postoperative delirium 10/25/2017  . Dyslipidemia 10/17/2017  . SOB (shortness of breath) 03/31/2017  . Long term current use of aspirin 03/29/2017  . Parkinson's disease ([place]) 01/02/2017  . Morbid (severe) obesity due to excess calories ([place]) 10/27/2015  . Memory change 07/06/2015  . Depression 07/06/2015  . Medial meniscus tear 10/11/2011  . Neuroma of foot 10/11/2011  . Coronary artery disease involving native coronary artery of native heart with angina pectoris ([place]) 12/28/2010  . 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 Mixed hyperlipidemia 05/20/2010  . Hypertensive heart disease with heart failure ([place]) 05/20/2010  . 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 GERD 05/20/2010  . TRANSIENT ISCHEMIC ATTACK, HX OF 05/20/2010  . NEPHROLITHIASIS, HX OF 05/20/2010  . BENIGN PROSTATIC HYPERTROPHY, HX OF, S/P TURP 05/20/2010    Arliss Journey, PT, DPT  09/19/2019, 10:59  AM  [facility] [address] [place], [place], [address] Phone: [phone]   Fax:  [phone]  Name: Shawn Meza MRN: [id number] Date of Birth: 1945-08-12

## 2019-09-20 ENCOUNTER — Encounter: Payer: Self-pay | Admitting: Physical Therapy

## 2019-09-20 ENCOUNTER — Ambulatory Visit: Payer: Medicare Other | Admitting: Physical Therapy

## 2019-09-20 ENCOUNTER — Other Ambulatory Visit: Payer: Self-pay

## 2019-09-20 DIAGNOSIS — M6281 Muscle weakness (generalized): Secondary | ICD-10-CM

## 2019-09-20 DIAGNOSIS — R2681 Unsteadiness on feet: Secondary | ICD-10-CM | POA: Diagnosis not present

## 2019-09-20 DIAGNOSIS — R2689 Other abnormalities of gait and mobility: Secondary | ICD-10-CM | POA: Diagnosis not present

## 2019-09-20 NOTE — Therapy (Signed)
[facility] [address] [place], [place], [address] Phone: [phone]   Fax:  [phone]  Physical Therapy Treatment  Patient Details  Name: Shawn Meza MRN: [id number] Date of Birth: [birth year] Referring Provider (PT): [name], Wells Guiles   Encounter Date: 09/20/2019  PT End of Session - 09/20/19 1541    Visit Number  20    Number of Visits  24   per recert [id number]   Date for PT Re-Evaluation  11/04/19    Authorization Type  UHC Medicare/Medicaid    PT Start Time  [id number]    PT Stop Time  1448    PT Time Calculation (min)  45 min    Activity Tolerance  Patient tolerated treatment well    Behavior During Therapy  [facility] for tasks assessed/performed       Past Medical History:  Diagnosis Date  . Arthritis   . BPH (benign prostatic hypertrophy)   . Chronic coronary artery disease   . Colon cancer ([place])   . Elevated PSA   . Erectile dysfunction   . Essential hypertension   . Essential tremor   . GERD (gastroesophageal reflux disease)   . Headache(784.0)   . Hiatal hernia   . Hypercholesterolemia   . Long-term use of aspirin therapy   . Major depression, chronic   . Medial meniscus tear 10/11/2011  . Metabolic syndrome   . Morbid obesity ([place])   . Nephrolithiasis    hx of  . NSTEMI (non-ST elevated myocardial infarction) ([place])   . Parkinson's disease ([place])   . S/P CABG (coronary artery bypass graft)   . Transient ischemic attack    hx of  . Trochanteric bursitis of right hip     Past Surgical History:  Procedure Laterality Date  . CARDIAC CATHETERIZATION  5/12,1/13   4 stents placed  . COLON SURGERY    . CORONARY ARTERY BYPASS GRAFT    . KNEE ARTHROSCOPY  10/11/2011   Procedure: ARTHROSCOPY KNEE;  Surgeon: Lorn Junes, MD;  Location: [place];  Service: Orthopedics;  Laterality: Left;  Left Knee Arthroscopy with Medial and Lateral Partial Menisectomy, Chondroplasty  . LEFT HEART  CATHETERIZATION WITH CORONARY ANGIOGRAM N/A 08/25/2011   Procedure: LEFT HEART CATHETERIZATION WITH CORONARY ANGIOGRAM;  Surgeon: Burnell Blanks, MD;  Location: [facility] CATH LAB;  Service: Cardiovascular;  Laterality: N/A;  . LITHOTRIPSY    . STERIOD INJECTION  10/11/2011   Procedure: STEROID INJECTION;  Surgeon: Lorn Junes, MD;  Location: [place];  Service: Orthopedics;  Laterality: Right;  Steroid Injection Second Toe  . TRANSURETHRAL RESECTION OF PROSTATE    . URETHRAL DILATION      There were no vitals filed for this visit.  Subjective Assessment - 09/20/19 1408    Subjective  continues to have c/o pain on L exagerated by cranking the generator last Saturday.  Hasn't done much since last weekend.    Pertinent History  hospitalized 2019 s/p CABG x 5, ischemic cardiomyopathy,CHF, Parkinson's disease x 4 years    Patient Stated Goals  Pt's goal for therapy is to be able to do things without pain and being worn out.    Currently in Pain?  Yes    Pain Score  7     Pain Location  Back    Pain Orientation  Left    Pain Descriptors / Indicators  Aching;Sharp   at times-sharp   Pain Type  Chronic pain  Pain Onset  More than a month ago    Pain Onset  More than a month ago    Pain Onset  More than a month ago             [facility] Adult PT Treatment/Exercise - 09/20/19 0001      Self-Care   Self-Care  Other Self-Care Comments    Other Self-Care Comments   Discussed optimal community fitness and PD resources (local and otherwise).  Provided with handout of PD resources.  Recommended for pt to call Judson Roch, SW, at Dr Doristine Devoid office concerning getting involved with a support group again along with following up on Psychologist appoinment/referal and referal to Dr Vertell Limber.  Provided pt with # for NPF to obtain PD resource booklets and provided pt with one of our booklets here on Memory for pt to look at and return.  Pt asking questions about his cognitiion and how PD can  affect cognition.  Pt did follow up on possiblity of walking track at church in [place] and they are not open due to covid.  Again discussed importance of taking medications consistently and without protein 30 minutes before.  Pt states he has been trying to be more consistent with medications but still having difficulty.  Contnue to encourage increased fitness and discussed that pt only has 2 remaining PT appointments and he needs to have a fitness plan that he can carry on after d/c.      Lumbar Exercises: Aerobic   Other Aerobic Exercise  Scifit level 3.0 all 4 extremities x 8 minutes with rpm>60             PT Education - 09/20/19 1540    Education Details  PD related resources, PD foundation booklets, importance of community fitness upon d/c, calling SW at Dr Doristine Devoid office    Person(s) Educated  Patient    Methods  Explanation;Handout    Comprehension  Verbalized understanding          PT Long Term Goals - 09/10/19 1200      PT LONG TERM GOAL #1   Title  Pt will be independent with HEP for improved transfers, gait, and trunk flexibility/decreased pain.  ONGOING GOAL:  target 09/27/2019    Baseline  Pt reports not consistently performing HEP.    Time  4    Period  Weeks    Status  On-going      PT LONG TERM GOAL #5   Title  Pt will verbalize understanding of local Parkinson's disease resources, including community fitness    Time  4    Period  Weeks    Status  On-going            Plan - 09/20/19 1541    Clinical Impression Statement  Continue to encourage community fitness and preparing for d/c from PT.  Pt continues to have questions concerning PD.  Pt also continues to c/o L back pain but has not followed up with Dr [name].  Continue PT per POC.    Personal Factors and Comorbidities  Comorbidity 3+    Comorbidities  See medical history in subjective (cardiac history)    Examination-Activity Limitations  Stand;Locomotion Level;Transfers    Examination-Participation  Restrictions  Yard Work   Acitivites associated with raises puppies   Stability/Clinical Decision Making  Evolving/Moderate complexity    Rehab Potential  Good    PT Frequency  2x / week    PT Duration  4 weeks   per  recert [id number], to include 09/06/2019 visit   PT Treatment/Interventions  ADLs/Self Care Home Management;Gait training;Functional mobility training;Therapeutic activities;Therapeutic exercise;Balance training;DME Instruction;Neuromuscular re-education;Patient/family education    PT Next Visit Plan  Did he call Sarah at Dr Doristine Devoid office?  Did he call PD foundation for booklets? Look at [facility] and revise as needed.  Provide info on optimal community fitness (HEP, walking, cardio)   See recert; continue LTGs for HEP and community fitness   PT Home Exercise Plan  [id number] - seated PWR moves  (PWR up and step!)    Consulted and Agree with Plan of Care  Patient       Patient will benefit from skilled therapeutic intervention in order to improve the following deficits and impairments:  Abnormal gait, Decreased coordination, Difficulty walking, Decreased strength, Decreased mobility, Postural dysfunction, Pain, Decreased balance  Visit Diagnosis: Unsteadiness on feet  Other abnormalities of gait and mobility  Muscle weakness (generalized)     Problem List Patient Active Problem List   Diagnosis Date Noted  . Fatigue 01/15/2019  . Chronic systolic (congestive) heart failure ([place]) 09/18/2018  . Ischemic cardiomyopathy 09/16/2018  . Aortic regurgitation 09/16/2018  . Cardiomyopathy, unspecified ([place]) 06/13/2018  . Abscess of right axilla 12/12/2017  . BMI 33.0-33.9,adult 12/12/2017  . 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  . Acute blood loss anemia 10/25/2017  . Acute postoperative respiratory insufficiency 10/25/2017  . Postoperative delirium 10/25/2017  . Dyslipidemia 10/17/2017  . SOB (shortness of breath) 03/31/2017  . Long term current use of aspirin  03/29/2017  . Parkinson's disease ([place]) 01/02/2017  . Morbid (severe) obesity due to excess calories ([place]) 10/27/2015  . Memory change 07/06/2015  . Depression 07/06/2015  . Medial meniscus tear 10/11/2011  . Neuroma of foot 10/11/2011  . Coronary artery disease involving native coronary artery of native heart with angina pectoris ([place]) 12/28/2010  . 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 Mixed hyperlipidemia 05/20/2010  . Hypertensive heart disease with heart failure ([place]) 05/20/2010  . 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 GERD 05/20/2010  . TRANSIENT ISCHEMIC ATTACK, HX OF 05/20/2010  . NEPHROLITHIASIS, HX OF 05/20/2010  . BENIGN PROSTATIC HYPERTROPHY, HX OF, S/P TURP 05/20/2010    Narda Bonds, PTA [facility] 09/20/19 3:45 PM Phone: [phone] Fax: [facility] [address] [place], [place], [address] Phone: [phone]   Fax:  [phone]  Name: Shawn Meza MRN: [id number] Date of Birth: Nov 20, 1945

## 2019-09-24 ENCOUNTER — Other Ambulatory Visit: Payer: Self-pay

## 2019-09-24 ENCOUNTER — Ambulatory Visit: Payer: Medicare Other | Admitting: Physical Therapy

## 2019-09-24 DIAGNOSIS — R2689 Other abnormalities of gait and mobility: Secondary | ICD-10-CM | POA: Diagnosis not present

## 2019-09-24 DIAGNOSIS — R2681 Unsteadiness on feet: Secondary | ICD-10-CM | POA: Diagnosis not present

## 2019-09-24 DIAGNOSIS — M6281 Muscle weakness (generalized): Secondary | ICD-10-CM | POA: Diagnosis not present

## 2019-09-24 NOTE — Therapy (Signed)
[facility] [address] [place], [place], [address] Phone: [phone]   Fax:  [phone]  Physical Therapy Treatment  Patient Details  Name: Shawn Meza MRN: [id number] Date of Birth: 08-31-1945 Referring Provider (PT): [name], Wells Guiles   Encounter Date: 09/24/2019  PT End of Session - 09/24/19 1751    Visit Number  21    Number of Visits  24   per recert [id number]   Date for PT Re-Evaluation  11/04/19    Authorization Type  UHC Medicare/Medicaid    PT Start Time  1702    PT Stop Time  1747    PT Time Calculation (min)  45 min    Activity Tolerance  Patient tolerated treatment well    Behavior During Therapy  [facility] for tasks assessed/performed       Past Medical History:  Diagnosis Date  . Arthritis   . BPH (benign prostatic hypertrophy)   . Chronic coronary artery disease   . Colon cancer ([place])   . Elevated PSA   . Erectile dysfunction   . Essential hypertension   . Essential tremor   . GERD (gastroesophageal reflux disease)   . Headache(784.0)   . Hiatal hernia   . Hypercholesterolemia   . Long-term use of aspirin therapy   . Major depression, chronic   . Medial meniscus tear 10/11/2011  . Metabolic syndrome   . Morbid obesity ([place])   . Nephrolithiasis    hx of  . NSTEMI (non-ST elevated myocardial infarction) ([place])   . Parkinson's disease ([place])   . S/P CABG (coronary artery bypass graft)   . Transient ischemic attack    hx of  . Trochanteric bursitis of right hip     Past Surgical History:  Procedure Laterality Date  . CARDIAC CATHETERIZATION  5/12,1/13   4 stents placed  . COLON SURGERY    . CORONARY ARTERY BYPASS GRAFT    . KNEE ARTHROSCOPY  10/11/2011   Procedure: ARTHROSCOPY KNEE;  Surgeon: Lorn Junes, MD;  Location: [facility];  Service: Orthopedics;  Laterality: Left;  Left Knee Arthroscopy with Medial and Lateral Partial Menisectomy, Chondroplasty  . LEFT HEART  CATHETERIZATION WITH CORONARY ANGIOGRAM N/A 08/25/2011   Procedure: LEFT HEART CATHETERIZATION WITH CORONARY ANGIOGRAM;  Surgeon: Burnell Blanks, MD;  Location: [facility] CATH LAB;  Service: Cardiovascular;  Laterality: N/A;  . LITHOTRIPSY    . STERIOD INJECTION  10/11/2011   Procedure: STEROID INJECTION;  Surgeon: Lorn Junes, MD;  Location: [facility];  Service: Orthopedics;  Laterality: Right;  Steroid Injection Second Toe  . TRANSURETHRAL RESECTION OF PROSTATE    . URETHRAL DILATION      There were no vitals filed for this visit.  Subjective Assessment - 09/24/19 1704    Subjective  Feeling pretty good today. A little sore today. Has been working on taking medication more consistently.    Pertinent History  hospitalized 2019 s/p CABG x 5, ischemic cardiomyopathy,CHF, Parkinson's disease x 4 years    Patient Stated Goals  Pt's goal for therapy is to be able to do things without pain and being worn out.    Currently in Pain?  Yes    Pain Score  6     Pain Location  Back    Pain Descriptors / Indicators  Sore    Pain Type  Chronic pain    Pain Onset  More than a month ago    Pain Onset  More than a month ago    Pain Onset  More than a month ago              09/24/19 0001  Ambulation/Gait  Ambulation/Gait Yes  Ambulation/Gait Assistance 5: Supervision  Ambulation/Gait Assistance Details discussed importance of continuing walking program at home - pt states that he would continue this outdoors over grass surfaces at home   Ambulation Distance (Feet) 400 Feet  Assistive device None  Gait Pattern Step-through pattern;Decreased arm swing - left;Decreased step length - left;Decreased stance time - left;Decreased trunk rotation;Decreased dorsiflexion - left  Ambulation Surface Unlevel;Grass  Self-Care  Self-Care Other Self-Care Comments  Other Self-Care Comments  Reviewed optimal community fitness and PD resources as well as the effect that exercise has on PD.   Pt states that when the weather is warmer he has plenty of space that he can walk around outside of his house. Also continued to discuss importance of calling Judson Roch, SW at Dr. Doristine Devoid office for getting involved with a support group. Showed pt the Every Victory Counts Manual from the [facility], pt interested in having one on his own - ordered one for pt off their website (free of charge).                  [facility]) - 09/24/19 1741    PWR! Rock  x10 reps    PWR! Twist  x10 reps   modified due to decr shoulder ROM   Comments  modified at [place]! Up  standing with walking poles for balance support and holding stretch for low back   x10 reps   PWR! Rock  standing with walking poles - wide BOS lateral weight shifting    holds at end range for additional stretch   PWR! Twist  modified in corner x10 reps   twisting across body towards sticky notes          PT Education - 09/24/19 1750    Education Details  continued information of PD related resources and importance of community fitness s/p discharge, ordered pt Every Victory Counts Manual from [employer]) Educated  Patient    Methods  Explanation    Comprehension  Verbalized understanding          PT Long Term Goals - 09/10/19 1200      PT LONG TERM GOAL #1   Title  Pt will be independent with HEP for improved transfers, gait, and trunk flexibility/decreased pain.  ONGOING GOAL:  target 09/27/2019    Baseline  Pt reports not consistently performing HEP.    Time  4    Period  Weeks    Status  On-going      PT LONG TERM GOAL #5   Title  Pt will verbalize understanding of local Parkinson's disease resources, including community fitness    Time  4    Period  Weeks    Status  On-going            Plan - 09/25/19 [id number]    Clinical Impression Statement  Today's skilled session continued to encourage pt on community fitness after d/c from PT as well as a walking  program outdoors. Reviewed community resources with pt as well as helped to order pt a free version of the Every Victory Counts Manual from the [facility]. Pt continues to have L back pain that decr after session with movement. Plan to D/C after  next session.    Personal Factors and Comorbidities  Comorbidity 3+    Comorbidities  See medical history in subjective (cardiac history)    Examination-Activity Limitations  Stand;Locomotion Level;Transfers    Examination-Participation Restrictions  Yard Work   Acitivites associated with raises puppies   Stability/Clinical Decision Making  Evolving/Moderate complexity    Rehab Potential  Good    PT Frequency  2x / week    PT Duration  4 weeks   per recert [id number], to include 09/06/2019 visit   PT Treatment/Interventions  ADLs/Self Care Home Management;Gait training;Functional mobility training;Therapeutic activities;Therapeutic exercise;Balance training;DME Instruction;Neuromuscular re-education;Patient/family education    PT Next Visit Plan  Did he call Sarah at Dr Doristine Devoid office?  Did he call PD foundation for booklets? review HEP. check final goals/discharge.   See recert; continue LTGs for HEP and community fitness   PT Home Exercise Plan  [id number] - seated PWR moves  (PWR up and step!)    Consulted and Agree with Plan of Care  Patient       Patient will benefit from skilled therapeutic intervention in order to improve the following deficits and impairments:  Abnormal gait, Decreased coordination, Difficulty walking, Decreased strength, Decreased mobility, Postural dysfunction, Pain, Decreased balance  Visit Diagnosis: Muscle weakness (generalized)  Unsteadiness on feet  Other abnormalities of gait and mobility     Problem List Patient Active Problem List   Diagnosis Date Noted  . Fatigue 01/15/2019  . Chronic systolic (congestive) heart failure ([place]) 09/18/2018  . Ischemic cardiomyopathy 09/16/2018  . Aortic regurgitation  09/16/2018  . Cardiomyopathy, unspecified ([place]) 06/13/2018  . Abscess of right axilla 12/12/2017  . BMI 33.0-33.9,adult 12/12/2017  . 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  . Acute blood loss anemia 10/25/2017  . Acute postoperative respiratory insufficiency 10/25/2017  . Postoperative delirium 10/25/2017  . Dyslipidemia 10/17/2017  . SOB (shortness of breath) 03/31/2017  . Long term current use of aspirin 03/29/2017  . Parkinson's disease ([place]) 01/02/2017  . Morbid (severe) obesity due to excess calories ([place]) 10/27/2015  . Memory change 07/06/2015  . Depression 07/06/2015  . Medial meniscus tear 10/11/2011  . Neuroma of foot 10/11/2011  . Coronary artery disease involving native coronary artery of native heart with angina pectoris ([place]) 12/28/2010  . 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 Mixed hyperlipidemia 05/20/2010  . Hypertensive heart disease with heart failure ([place]) 05/20/2010  . 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 GERD 05/20/2010  . TRANSIENT ISCHEMIC ATTACK, HX OF 05/20/2010  . NEPHROLITHIASIS, HX OF 05/20/2010  . BENIGN PROSTATIC HYPERTROPHY, HX OF, S/P TURP 05/20/2010    Arliss Journey, PT, DPT  09/25/2019, 8:35 AM  [facility] [address] [place], [place], [address] Phone: [phone]   Fax:  [phone]  Name: Shawn Meza MRN: [id number] Date of Birth: [birth year]

## 2019-09-26 ENCOUNTER — Ambulatory Visit: Payer: Medicare Other | Admitting: Physical Therapy

## 2019-09-26 ENCOUNTER — Encounter: Payer: Self-pay | Admitting: Physical Therapy

## 2019-09-26 ENCOUNTER — Other Ambulatory Visit: Payer: Self-pay

## 2019-09-26 DIAGNOSIS — R2681 Unsteadiness on feet: Secondary | ICD-10-CM

## 2019-09-26 DIAGNOSIS — R2689 Other abnormalities of gait and mobility: Secondary | ICD-10-CM | POA: Diagnosis not present

## 2019-09-26 DIAGNOSIS — M6281 Muscle weakness (generalized): Secondary | ICD-10-CM | POA: Diagnosis not present

## 2019-09-26 NOTE — Therapy (Signed)
[facility] [address] [place], [place], [address] Phone: [phone]   Fax:  [phone]  Physical Therapy Treatment  Patient Details  Name: Shawn Meza MRN: [id number] Date of Birth: 1946/06/24 Referring Provider (PT): [name], Wells Guiles   Encounter Date: 09/26/2019  PT End of Session - 09/26/19 1632    Visit Number  22    Number of Visits  24   per recert 01/30/5365   Date for PT Re-Evaluation  11/04/19    Authorization Type  UHC Medicare/Medicaid    PT Start Time  1315    PT Stop Time  1400    PT Time Calculation (min)  45 min    Activity Tolerance  Patient tolerated treatment well    Behavior During Therapy  [facility] for tasks assessed/performed       Past Medical History:  Diagnosis Date  . Arthritis   . BPH (benign prostatic hypertrophy)   . Chronic coronary artery disease   . Colon cancer ([place])   . Elevated PSA   . Erectile dysfunction   . Essential hypertension   . Essential tremor   . GERD (gastroesophageal reflux disease)   . Headache(784.0)   . Hiatal hernia   . Hypercholesterolemia   . Long-term use of aspirin therapy   . Major depression, chronic   . Medial meniscus tear 10/11/2011  . Metabolic syndrome   . Morbid obesity ([place])   . Nephrolithiasis    hx of  . NSTEMI (non-ST elevated myocardial infarction) ([place])   . Parkinson's disease ([place])   . S/P CABG (coronary artery bypass graft)   . Transient ischemic attack    hx of  . Trochanteric bursitis of right hip     Past Surgical History:  Procedure Laterality Date  . CARDIAC CATHETERIZATION  5/12,1/13   4 stents placed  . COLON SURGERY    . CORONARY ARTERY BYPASS GRAFT    . KNEE ARTHROSCOPY  10/11/2011   Procedure: ARTHROSCOPY KNEE;  Surgeon: Lorn Junes, MD;  Location: [facility];  Service: Orthopedics;  Laterality: Left;  Left Knee Arthroscopy with Medial and Lateral Partial Menisectomy, Chondroplasty  . LEFT HEART  CATHETERIZATION WITH CORONARY ANGIOGRAM N/A 08/25/2011   Procedure: LEFT HEART CATHETERIZATION WITH CORONARY ANGIOGRAM;  Surgeon: Burnell Blanks, MD;  Location: [facility] CATH LAB;  Service: Cardiovascular;  Laterality: N/A;  . LITHOTRIPSY    . STERIOD INJECTION  10/11/2011   Procedure: STEROID INJECTION;  Surgeon: Lorn Junes, MD;  Location: [facility];  Service: Orthopedics;  Laterality: Right;  Steroid Injection Second Toe  . TRANSURETHRAL RESECTION OF PROSTATE    . URETHRAL DILATION      There were no vitals filed for this visit.  Subjective Assessment - 09/26/19 1321    Subjective  Pt states he's been more active outside-been cutting tress and walking to neighbors.  Has been building bird boxes again.  Denies any falls or changes.    Pertinent History  hospitalized 2019 s/p CABG x 5, ischemic cardiomyopathy,CHF, Parkinson's disease x 4 years    Patient Stated Goals  Pt's goal for therapy is to be able to do things without pain and being worn out.    Currently in Pain?  Yes    Pain Score  3     Pain Location  Back    Pain Descriptors / Indicators  Sore    Pain Type  Chronic pain    Pain Onset  More  than a month ago    Aggravating Factors   increased standing    Pain Onset  More than a month ago    Pain Onset  More than a month ago        [facility] Adult PT Treatment/Exercise - 09/26/19 0001      Transfers   Transfers  Sit to Stand;Stand to Sit    Sit to Stand  5: Supervision    Five time sit to stand comments   8.53 seconds    Stand to Sit  5: Supervision      Ambulation/Gait   Ambulation/Gait  Yes    Ambulation/Gait Assistance  6: Modified independent (Device/Increase time)    Assistive device  None    Gait Pattern  Step-through pattern;Decreased arm swing - left;Decreased step length - left;Decreased stance time - left;Decreased trunk rotation;Decreased dorsiflexion - left    Ambulation Surface  Level;Indoor    Gait velocity  3.74 ft/sec=8.78 sec       Posture/Postural Control   Posture/Postural Control  Postural limitations    Postural Limitations  Forward head;Rounded Shoulders    Posture Comments  L shoulder lower than R      Standardized Balance Assessment   Standardized Balance Assessment  Timed Up and Go Test      Timed Up and Go Test   TUG  Normal TUG    Normal TUG (seconds)  7.34      Self-Care   Self-Care  Other Self-Care Comments    Other Self-Care Comments   Discussed PD screen in 6 months.  Pt asking about OT for UE for fine motor and strength along with should ROM.  Pt still having L back/hip pain and is going to see Dr [name] next week for his f/u.  Pt states he plans to discuss with her along with talking to SW, Judson Roch, about PD support group.  Again discussed importance of optimal community fitness for PD.       Exercises   Exercises  Knee/Hip;Other Exercises      Lumbar Exercises: Aerobic   Nustep  NuStep, workload l 4, 4 extremities, x 8 minutes for leg flexibility and strengthening.             PT Education - 09/26/19 1631    Education Details  PD screen in 6 months, optimal community fitness    Person(s) Educated  Patient    Methods  Explanation    Comprehension  Verbalized understanding          PT Long Term Goals - 09/26/19 1632      PT LONG TERM GOAL #1   Title  Pt will be independent with HEP for improved transfers, gait, and trunk flexibility/decreased pain.  ONGOING GOAL:  target 09/27/2019    Baseline  Pt reports not consistently performing HEP.    Time  4    Period  Weeks    Status  Achieved      PT LONG TERM GOAL #5   Title  Pt will verbalize understanding of local Parkinson's disease resources, including community fitness    Time  4    Period  Weeks    Status  Achieved            Plan - 09/26/19 1633    Clinical Impression Statement  Pt has met LTG 1 and 2.  Pt continues to have L back/hip pain but is has f/u appt with Dr [name] next week and will ask about neuro referral.  Pt  asking about possibility of OT for UE.  Discharge pt from PT per [facility], PT, and screen in 6 months.    Personal Factors and Comorbidities  Comorbidity 3+    Comorbidities  See medical history in subjective (cardiac history)    Examination-Activity Limitations  Stand;Locomotion Level;Transfers    Examination-Participation Restrictions  Yard Work   Acitivites associated with raises puppies   Stability/Clinical Decision Making  Evolving/Moderate complexity    Rehab Potential  Good    PT Frequency  2x / week    PT Duration  4 weeks   per recert 2/0/8022, to include 09/06/2019 visit   PT Treatment/Interventions  ADLs/Self Care Home Management;Gait training;Functional mobility training;Therapeutic activities;Therapeutic exercise;Balance training;DME Instruction;Neuromuscular re-education;Patient/family education    PT Next Visit Plan  Discharge per [facility], PT and screen in 6 months.  Follow up with Dr [name] concerning possibility of OT referral.   See recert; continue LTGs for HEP and community fitness   PT Home Exercise Plan  [id number] - seated PWR moves  (PWR up and step!)    Consulted and Agree with Plan of Care  Patient       Patient will benefit from skilled therapeutic intervention in order to improve the following deficits and impairments:  Abnormal gait, Decreased coordination, Difficulty walking, Decreased strength, Decreased mobility, Postural dysfunction, Pain, Decreased balance  Visit Diagnosis: Muscle weakness (generalized)  Unsteadiness on feet  Other abnormalities of gait and mobility     Problem List Patient Active Problem List   Diagnosis Date Noted  . Fatigue 01/15/2019  . Chronic systolic (congestive) heart failure ([place]) 09/18/2018  . Ischemic cardiomyopathy 09/16/2018  . Aortic regurgitation 09/16/2018  . Cardiomyopathy, unspecified ([place]) 06/13/2018  . Abscess of right axilla 12/12/2017  . BMI 33.0-33.9,adult 12/12/2017  . S/P CABG (coronary artery  bypass graft) 11/23/2017  . Acute blood loss anemia 10/25/2017  . Acute postoperative respiratory insufficiency 10/25/2017  . Postoperative delirium 10/25/2017  . Dyslipidemia 10/17/2017  . SOB (shortness of breath) 03/31/2017  . Long term current use of aspirin 03/29/2017  . Parkinson's disease ([place]) 01/02/2017  . Morbid (severe) obesity due to excess calories ([place]) 10/27/2015  . Memory change 07/06/2015  . Depression 07/06/2015  . Medial meniscus tear 10/11/2011  . Neuroma of foot 10/11/2011  . Coronary artery disease involving native coronary artery of native heart with angina pectoris ([place]) 12/28/2010  . 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 Mixed hyperlipidemia 05/20/2010  . Hypertensive heart disease with heart failure ([place]) 05/20/2010  . 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 GERD 05/20/2010  . TRANSIENT ISCHEMIC ATTACK, HX OF 05/20/2010  . NEPHROLITHIASIS, HX OF 05/20/2010  . BENIGN PROSTATIC HYPERTROPHY, HX OF, S/P TURP 05/20/2010   Narda Bonds, PTA [place] 09/26/19 4:43 PM Phone: [phone] Fax: [facility] [address] [place], [place], [address] Phone: [phone]   Fax:  [phone]  Name: Shawn Meza MRN: [id number] Date of Birth: October 25, 1945

## 2019-09-27 ENCOUNTER — Encounter: Payer: Self-pay | Admitting: Physical Therapy

## 2019-09-27 ENCOUNTER — Ambulatory Visit: Payer: Medicare Other | Attending: Internal Medicine

## 2019-09-27 DIAGNOSIS — Z23 Encounter for immunization: Secondary | ICD-10-CM

## 2019-09-27 NOTE — Progress Notes (Signed)
   Covid-19 Vaccination Clinic  Name:  Shawn Meza    MRN: [id number] DOB: 07-13-1946  09/27/2019  Mr. Peri was observed post Covid-19 immunization for 15 minutes without incidence. He was provided with Vaccine Information Sheet and instruction to access the V-Safe system.   [name] was instructed to call 911 with any severe reactions post vaccine: Marland Kitchen Difficulty breathing  . Swelling of your face and throat  . A fast heartbeat  . A bad rash all over your body  . Dizziness and weakness    Immunizations Administered    Name Date Dose VIS Date Route   Pfizer COVID-19 Vaccine 09/27/2019  8:35 AM 0.3 mL 07/12/2019 Intramuscular   Manufacturer: [place]   Lot: [id number]   [place]: [id number]

## 2019-09-27 NOTE — Therapy (Signed)
[facility] [address] [place], [place], [address] Phone: [phone]   Fax:  [phone]  Patient Details  Name: Shawn Meza MRN: [id number] Date of Birth: 1946-04-05 Referring Provider:  No ref. provider found  Encounter Date: 09/27/2019   PHYSICAL THERAPY DISCHARGE SUMMARY  Visits from Start of Care: 22  Current functional level related to goals / functional outcomes: Pt has met all 5 of 5 LTGs.    Remaining deficits: High level balance, pain at times   Education / Equipment: Educated in [place]: Patient agrees to discharge.  Patient goals were met. Patient is being discharged due to meeting the stated rehab goals.  ?????Recommend return PD screens in 6 months.        Lyrick Worland W. 09/27/2019, 10:02 AM Mady Haagensen, PT 09/27/19 10:06 AM Phone: [phone] Fax: [facility] [address] [place], [place], [address] Phone: [phone]   Fax:  [phone]

## 2019-10-03 NOTE — Progress Notes (Signed)
Shawn Meza was seen today in follow up for Parkinsons disease.  Carbidopa/levodopa 25/100 CR was started last visit.  My previous records were reviewed prior to todays visit as well as outside records available to me. Pt had a fall - got up quick as thought someone was at the door and fell against wooden rocking chair. Pt denies lightheadedness, near syncope.  No hallucinations.   I did an MRI of the lumbar spine since last visit.  I personally reviewed that.  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  There was also moderate to severe central canal stenosis at the L5-S1 level.  I referred the patient to Shawn Meza.  I got a note from the physical therapist in January that pt never heard from Shawn Meza office, but pt never called me or Shawn Meza office.   My office ended up calling the patient multiple times, but he never answered.  In addition, last visit, we discussed the fact that the patient had a history of depression and his antidepressants appeared to have been stopped by him.  His primary care had previously been treating that.  The physical therapist was concerned about his depression.  We tried to call him back to address this so we could see if he was agreeable to a psychiatry referral, but again he did not answer the telephone.  Pt states today that he is on the Trintellix, and mood issues are really from the fact that he is isolated because of the pandemic and has no one to talk to.  He has been going to counseling online.  He does not feel that he is significantly depressed at all.  In addition, he states that he never got a call from Shawn Meza office so when we called them to check on the referral they let us know that they didn't previously get a referral  Current prescribed movement disorder medications: Carbidopa/levodopa 25/100 CR, 1 tablet 3 times per day (started last visit)- 6am/noon/ 10pm-2am per  pt B12, 1000 mcg daily   PREVIOUS MEDICATIONS: Sinemet and Requip  ALLERGIES:   Allergies  Allergen Reactions  . Testosterone Other (See Comments)    ABDOMINAL PAIN and cramping  . Fluoxetine Other (See Comments)    Caused depression and aggression  . Requip [Ropinirole] Nausea Only    CURRENT MEDICATIONS:  Outpatient Encounter Medications as of 10/04/2019  Medication Sig  . aspirin 81 MG tablet Take 81 mg by mouth daily.   . Carbidopa-Levodopa ER (SINEMET CR) 25-100 MG tablet controlled release Take 1 tablet by mouth 3 (three) times daily.  . clopidogrel (PLAVIX) 75 MG tablet TAKE 1 TABLET BY MOUTH ONCE DAILY.  Marland Kitchen ENTRESTO 24-26 MG TAKE 1 TABLET BY MOUTH 2 TIMES DAILY.  . ergocalciferol (VITAMIN D2) 1.25 MG (50000 UT) capsule Take 1 capsule (50,000 Units total) by mouth once a week.  . esomeprazole (NEXIUM) 40 MG capsule Take 1 capsule by mouth daily.  . fluocinonide-emollient (LIDEX-E) 0.05 % cream Apply 1 application topically 2 (two) times daily.  . furosemide (LASIX) 40 MG tablet Take 1 tablet (40 mg total) by mouth daily.  Marland Kitchen gabapentin (NEURONTIN) 100 MG capsule Take 1 capsule (100 mg total) by mouth at bedtime.  . metoprolol tartrate (LOPRESSOR) 25 MG tablet TAKE 1/2 TABLET BY MOUTH 2 TIMES DAILY.  . Multiple Vitamin (MULTI-VITAMINS) TABS Take 1 tablet by mouth daily.   . nitroGLYCERIN (NITROSTAT) 0.4  MG SL tablet Place 1 tablet (0.4 mg total) under the tongue every 5 (five) minutes as needed. Chest pain  . pravastatin (PRAVACHOL) 20 MG tablet TAKE 1 TABLET BY MOUTH EVERY EVENING.  . tamsulosin (FLOMAX) 0.4 MG CAPS capsule Take 0.4 mg by mouth daily as needed (to relax the prostate).   . TRINTELLIX 5 MG TABS tablet Take 1 tablet (5 mg total) by mouth daily.   No facility-administered encounter medications on file as of 10/04/2019.    PHYSICAL EXAMINATION:    VITALS:   Vitals:   10/04/19 1301  BP: 132/76  Pulse: 78  Resp: 20  SpO2: 95%  Weight: 212 lb (96.2 kg)    Height: 5\' 6"  (1.676 m)    GEN:  The patient appears stated age and is in NAD. HEENT:  Normocephalic, atraumatic.  The mucous membranes are moist. The superficial temporal arteries are without ropiness or tenderness. CV:  RRR Lungs:  CTAB Neck/HEME:  There are no carotid bruits bilaterally.  Neurological examination:  Orientation: The patient is alert and oriented x3. Cranial nerves: There is good facial symmetry with facial hypomimia. The speech is fluent and clear. Soft palate rises symmetrically and there is no tongue deviation. Hearing is intact to conversational tone. Sensation: Sensation is intact to light touch throughout Motor: Strength is at least antigravity x4.   Pt seen at 1:30 pm and hasn't taken any carbidopa/levodopa 25/100 yet today:  Movement examination: Tone: There is increased tone in the LUE/LLE Abnormal movements: there is LUE rest tremor and occasional LLE rest tremor Coordination:  There is decremation with any form of RAMS, including alternating supination and pronation of the forearm, hand opening and closing, finger taps, heel taps and toe taps.  Gait and Station: The patient has no difficulty arising out of a deep-seated chair without the use of the hands. The patient's stride length is good with good arm swing but purposeful.     ASSESSMENT/PLAN:  1.  Parkinsons Disease  -Continue carbidopa/levodopa 25/100 CR, 1 tablet 3 times per day.  Needs to change the last dose to 4-5 pm from bedtime.  Talked about taking it regularly and on time.  2.  Depression  -Address this in detail today.  The patient really thinks he is doing fairly well on Trintellix and has been going to ongoing counseling.  He stated that his mood issues really are related to the fact that he is confined to home, has no one to talk to and is tired of the pandemic.  Declines referral to psychiatry today.  3.  B12 deficiency  -Patient now on oral supplementation.  4.  Low back pain with  MRI evidence of severe lumbar stenosis  -will send new referral to Shawn Meza office  Total time spent on today's visit was 40 minutes, including both face-to-face time and nonface-to-face time.  Time included that spent on review of records (prior notes available to me/labs/imaging if pertinent), discussing treatment and goals, answering patient's questions and coordinating care.  Cc:  [name], DO

## 2019-10-04 ENCOUNTER — Ambulatory Visit (INDEPENDENT_AMBULATORY_CARE_PROVIDER_SITE_OTHER): Payer: Medicare Other | Admitting: Neurology

## 2019-10-04 ENCOUNTER — Encounter: Payer: Self-pay | Admitting: Neurology

## 2019-10-04 ENCOUNTER — Other Ambulatory Visit: Payer: Self-pay

## 2019-10-04 VITALS — BP 132/76 | HR 78 | Resp 20 | Ht 66.0 in | Wt 212.0 lb

## 2019-10-04 DIAGNOSIS — G20 Parkinson's disease: Secondary | ICD-10-CM | POA: Diagnosis not present

## 2019-10-04 DIAGNOSIS — F330 Major depressive disorder, recurrent, mild: Secondary | ICD-10-CM

## 2019-10-04 DIAGNOSIS — M5416 Radiculopathy, lumbar region: Secondary | ICD-10-CM

## 2019-10-04 NOTE — Patient Instructions (Signed)
1.  Take carbidopa/levodopa 25/100 CR - 1 tablet at 7am/11am/4pm (the last one should NOT be at bedtime) 2.  Let me know if you want a referral to a psychiatrist 3.  You need to get involved with our online exercise programs and support groups 4.  We will send referral for your back to Dr. Vertell Limber at [facility].  If you don't hear from them call:  [phone]  The physicians and staff at [facility] are committed to providing excellent care. You may receive a survey requesting feedback about your experience at our office. We strive to receive "very good" responses to the survey questions. If you feel that your experience would prevent you from giving the office a "very good " response, please contact our office to try to remedy the situation. We may be reached at [phone]. Thank you for taking the time out of your busy day to complete the survey.

## 2019-10-22 ENCOUNTER — Ambulatory Visit: Payer: Medicare Other | Attending: Internal Medicine

## 2019-10-22 DIAGNOSIS — Z23 Encounter for immunization: Secondary | ICD-10-CM

## 2019-10-22 NOTE — Progress Notes (Signed)
   Covid-19 Vaccination Clinic  Name:  Shawn Meza    MRN: [id number] DOB: [birth year]  10/22/2019  Mr. Liebau was observed post Covid-19 immunization for 15 minutes without incident. He was provided with Vaccine Information Sheet and instruction to access the V-Safe system.   Mr. Barrozo was instructed to call 911 with any severe reactions post vaccine: Marland Kitchen Difficulty breathing  . Swelling of face and throat  . A fast heartbeat  . A bad rash all over body  . Dizziness and weakness   Immunizations Administered    Name Date Dose VIS Date Route   Pfizer COVID-19 Vaccine 10/22/2019 10:14 AM 0.3 mL 07/12/2019 Intramuscular   Manufacturer: [place]   Lot: [id number]   [place]: [id number]

## 2019-10-28 DIAGNOSIS — M545 Low back pain, unspecified: Secondary | ICD-10-CM

## 2019-10-28 DIAGNOSIS — M5416 Radiculopathy, lumbar region: Secondary | ICD-10-CM

## 2019-10-28 DIAGNOSIS — G20 Parkinson's disease: Secondary | ICD-10-CM | POA: Diagnosis not present

## 2019-10-28 DIAGNOSIS — M48061 Spinal stenosis, lumbar region without neurogenic claudication: Secondary | ICD-10-CM

## 2019-10-28 HISTORY — DX: Spinal stenosis, lumbar region without neurogenic claudication: M48.061

## 2019-10-28 HISTORY — DX: Low back pain, unspecified: M54.50

## 2019-10-28 HISTORY — DX: Radiculopathy, lumbar region: M54.16

## 2019-10-29 ENCOUNTER — Telehealth: Payer: Self-pay | Admitting: Cardiology

## 2019-10-29 NOTE — Telephone Encounter (Signed)
[place] is calling to verify Dr. [name] prescribes the patient's Plavix and verified the fdax number to send over a clearance request. I advised her Dr. [name] does prescribe the patient's Plavix and gave the [employer] fax number.

## 2019-10-30 ENCOUNTER — Telehealth: Payer: Self-pay | Admitting: Cardiology

## 2019-10-30 NOTE — Telephone Encounter (Signed)
       [facility] Pre-operative Risk Assessment    Request for surgical clearance:  1. What type of surgery is being performed? L4, L5 epidural   2. When is this surgery scheduled? 12/02/19   3. What type of clearance is required (medical clearance vs. Pharmacy clearance to hold med vs. Both)? Pharmacy clearance  4. Are there any medications that need to be held prior to surgery and how long? Plavix to be held 7 days prior   5. Practice name and name of physician performing surgery? Dr. Clydell Hakim - [facility]   6. What is your office phone number [phone]   7.   What is your office fax number [phone]  8.   Anesthesia type (None, local, MAC, general) ? Local   Angeline S Hammer 10/30/2019, 9:13 AM  _________________________________________________________________   (provider comments below)

## 2019-10-30 NOTE — Telephone Encounter (Signed)
   Primary Cardiologist: Shirlee More, MD  Chart reviewed as part of pre-operative protocol coverage. Given past medical history and time since last visit, based on ACC/AHA guidelines, Shawn Meza would be at acceptable risk for the planned procedure without further cardiovascular testing.   He may hold Plavix 7 days prior to procedure but restart ASAP afterwards. Please make his procedure appointment to align with stopping Plavix prior to injections.  I will route this recommendation to the requesting party via Epic fax function and remove from pre-op pool.  Please call with questions.  Phill Myron. [place] Pugh, ANP, AACC  10/30/2019, 9:32 AM

## 2019-11-05 ENCOUNTER — Telehealth: Payer: Self-pay | Admitting: Cardiology

## 2019-11-05 NOTE — Telephone Encounter (Signed)
Follow Up:     Javanna from Dr Maryjean Ka office called. She wants to check on the pt's Plavix clearance .She said she received that pt was not Dr [name] patient. Pt was last seen by Dr [name] on 05-28-19. Please fax this clearance to [phone].[employer]

## 2019-11-05 NOTE — Telephone Encounter (Signed)
   See clearance note from 10/30/19. Will re-fax clearance information to [phone] at this time.   Abigail Butts, PA-C 11/05/19; 2:27 PM

## 2019-11-05 NOTE — Telephone Encounter (Signed)
      I was looking in pt's chart to find out about pt's clearance

## 2019-11-22 ENCOUNTER — Other Ambulatory Visit: Payer: Self-pay | Admitting: Cardiology

## 2019-11-22 DIAGNOSIS — I2510 Atherosclerotic heart disease of native coronary artery without angina pectoris: Secondary | ICD-10-CM

## 2019-12-02 DIAGNOSIS — M48061 Spinal stenosis, lumbar region without neurogenic claudication: Secondary | ICD-10-CM | POA: Diagnosis not present

## 2019-12-11 ENCOUNTER — Other Ambulatory Visit: Payer: Self-pay | Admitting: Cardiology

## 2019-12-20 ENCOUNTER — Other Ambulatory Visit: Payer: Self-pay | Admitting: Cardiology

## 2019-12-20 ENCOUNTER — Other Ambulatory Visit: Payer: Self-pay | Admitting: Family Medicine

## 2019-12-20 DIAGNOSIS — M48061 Spinal stenosis, lumbar region without neurogenic claudication: Secondary | ICD-10-CM | POA: Diagnosis not present

## 2019-12-20 DIAGNOSIS — G20 Parkinson's disease: Secondary | ICD-10-CM | POA: Diagnosis not present

## 2019-12-20 DIAGNOSIS — M7918 Myalgia, other site: Secondary | ICD-10-CM

## 2019-12-20 HISTORY — DX: Myalgia, other site: M79.18

## 2019-12-20 NOTE — Telephone Encounter (Signed)
Rx written 1 year ago-sent for review of request

## 2019-12-25 ENCOUNTER — Other Ambulatory Visit: Payer: Self-pay | Admitting: Neurology

## 2019-12-25 DIAGNOSIS — M48061 Spinal stenosis, lumbar region without neurogenic claudication: Secondary | ICD-10-CM | POA: Diagnosis not present

## 2019-12-25 DIAGNOSIS — I10 Essential (primary) hypertension: Secondary | ICD-10-CM | POA: Diagnosis not present

## 2019-12-25 DIAGNOSIS — M7918 Myalgia, other site: Secondary | ICD-10-CM | POA: Diagnosis not present

## 2019-12-25 DIAGNOSIS — G20 Parkinson's disease: Secondary | ICD-10-CM | POA: Diagnosis not present

## 2019-12-25 DIAGNOSIS — M545 Low back pain: Secondary | ICD-10-CM | POA: Diagnosis not present

## 2020-01-02 ENCOUNTER — Telehealth: Payer: Self-pay | Admitting: Neurology

## 2020-01-02 NOTE — Telephone Encounter (Signed)
Patient called and requested a call back about his next steps. He said he went and saw Dr. Deirdre Peer but is not sure now what he needs to do now. Patient said, "It may be that I need a referral for PT."

## 2020-01-02 NOTE — Telephone Encounter (Signed)
Patient states Dr Vertell Limber was going to refer him to physical therapy. He wanted to no what was the name of the place he completed therapy at in the past. Reviewed patients chart and gave him name and number of Neuro Rehab patient voiced understanding and stated he would give the info to Dr Donald Pore office.

## 2020-01-22 ENCOUNTER — Ambulatory Visit (INDEPENDENT_AMBULATORY_CARE_PROVIDER_SITE_OTHER): Payer: Medicare Other | Admitting: Family Medicine

## 2020-01-22 ENCOUNTER — Other Ambulatory Visit: Payer: Self-pay

## 2020-01-22 ENCOUNTER — Encounter: Payer: Self-pay | Admitting: Family Medicine

## 2020-01-22 VITALS — BP 118/72 | HR 88 | Temp 96.5°F | Ht 66.0 in | Wt 212.1 lb

## 2020-01-22 DIAGNOSIS — G8929 Other chronic pain: Secondary | ICD-10-CM | POA: Diagnosis not present

## 2020-01-22 DIAGNOSIS — M545 Low back pain, unspecified: Secondary | ICD-10-CM

## 2020-01-22 DIAGNOSIS — R5383 Other fatigue: Secondary | ICD-10-CM | POA: Diagnosis not present

## 2020-01-22 DIAGNOSIS — M25511 Pain in right shoulder: Secondary | ICD-10-CM | POA: Diagnosis not present

## 2020-01-22 DIAGNOSIS — E559 Vitamin D deficiency, unspecified: Secondary | ICD-10-CM | POA: Diagnosis not present

## 2020-01-22 DIAGNOSIS — H539 Unspecified visual disturbance: Secondary | ICD-10-CM | POA: Diagnosis not present

## 2020-01-22 NOTE — Progress Notes (Signed)
Chief Complaint  Patient presents with  . Fatigue    Subjective: Patient is a 74 y.o. male here for fatigue.  3 mo of fatigue, was getting worse as of last week. Feels low energy and sleepy. No fevers, N/V/D, SOB, unexplained wt changes, bleeding. Mood has been "passport" since his psychiatrist passed away and he is not on replacement.  He does take Trintellix 5 mg daily. He does snore at night. Does not wake up gasping for air.   Past Medical History:  Diagnosis Date  . Arthritis   . BPH (benign prostatic hypertrophy)   . Chronic coronary artery disease   . Colon cancer ([place])   . Elevated PSA   . Erectile dysfunction   . Essential hypertension   . Essential tremor   . GERD (gastroesophageal reflux disease)   . Headache(784.0)   . Hiatal hernia   . Hypercholesterolemia   . Long-term use of aspirin therapy   . Major depression, chronic   . Medial meniscus tear 10/11/2011  . Metabolic syndrome   . Morbid obesity ([place])   . Nephrolithiasis    hx of  . NSTEMI (non-ST elevated myocardial infarction) ([place])   . Parkinson's disease ([place])   . S/P CABG (coronary artery bypass graft)   . Transient ischemic attack    hx of  . Trochanteric bursitis of right hip     Objective: BP 118/72 (BP Location: Left Arm, Patient Position: Sitting, Cuff Size: Normal)   Pulse 88   Temp (!) 96.5 F (35.8 C) (Temporal)   Ht 5\' 6"  (1.676 m)   Wt 212 lb 2 oz (96.2 kg)   SpO2 96%   BMI 34.24 kg/m  General: Awake, appears stated age HEENT: MMM, EOMi Heart: RRR, no lower extremity edema Neck: No thyromegaly appreciated or gross deformity Lungs: CTAB, no rales, wheezes or rhonchi. No accessory muscle use Psych: Age appropriate judgment and insight, normal affect and mood  Assessment and Plan: Fatigue, unspecified type - Plan: CBC, Comprehensive metabolic panel, VITAMIN D 25 Hydroxy (Vit-D Deficiency, Fractures), TSH  Chronic right shoulder pain - Plan: Ambulatory referral to Physical  Therapy  Chronic bilateral low back pain, unspecified whether sciatica present - Plan: Ambulatory referral to Physical Therapy  Vision changes - Plan: Ambulatory referral to Ophthalmology  1-check above labs.  I think they will be normal and if so, we will increase his doses of Trintellix and follow-up in 1 month. 2/3-he has had a poor experience with his specialty team and we will place the referral for physical therapy on the behalf. 4-he was diagnosed with macular degeneration and was recommended to follow-up but this was lost due to the pandemic.  We will rerefer at his request. The patient voiced understanding and agreement to the plan.  [place], DO 01/22/20  5:00 PM

## 2020-01-22 NOTE — Patient Instructions (Addendum)
Keep the diet clean and stay active.  Give [place] 2-3 business days to get the results of your labs back.   If you do not hear anything about your referral in the next 1-2 weeks, call our office and ask for an update.  I think this could be depression. If the labs are normal, we are going to increase the dosage of your Trintellix.   Let us know if you need anything.

## 2020-01-23 ENCOUNTER — Other Ambulatory Visit: Payer: Self-pay | Admitting: Family Medicine

## 2020-01-23 LAB — COMPREHENSIVE METABOLIC PANEL
ALT: 27 U/L (ref 0–53)
AST: 23 U/L (ref 0–37)
Albumin: 4.4 g/dL (ref 3.5–5.2)
Alkaline Phosphatase: 64 U/L (ref 39–117)
BUN: 20 mg/dL (ref 6–23)
CO2: 26 mEq/L (ref 19–32)
Calcium: 9.3 mg/dL (ref 8.4–10.5)
Chloride: 105 mEq/L (ref 96–112)
Creatinine, Ser: 1.05 mg/dL (ref 0.40–1.50)
GFR: 68.7 mL/min (ref >60.00)
Glucose, Bld: 112 mg/dL — ABNORMAL HIGH (ref 70–99)
Potassium: 3.9 mEq/L (ref 3.5–5.1)
Sodium: 140 mEq/L (ref 135–145)
Total Bilirubin: 0.5 mg/dL (ref 0.2–1.2)
Total Protein: 6.8 g/dL (ref 6.0–8.3)

## 2020-01-23 LAB — CBC
HCT: 46.2 % (ref 39.0–52.0)
Hemoglobin: 15.6 g/dL (ref 13.0–17.0)
MCHC: 33.8 g/dL (ref 30.0–36.0)
MCV: 94.7 fl (ref 78.0–100.0)
Platelets: 200 10*3/uL (ref 150.0–400.0)
RBC: 4.88 Mil/uL (ref 4.22–5.81)
RDW: 13.6 % (ref 11.5–15.5)
WBC: 8.9 10*3/uL (ref 4.0–10.5)

## 2020-01-23 LAB — VITAMIN D 25 HYDROXY (VIT D DEFICIENCY, FRACTURES): VITD: 19.92 ng/mL — ABNORMAL LOW (ref 30.00–100.00)

## 2020-01-23 LAB — TSH: TSH: 1.73 u[IU]/mL (ref 0.35–4.50)

## 2020-01-23 MED ORDER — ERGOCALCIFEROL 1.25 MG (50000 UT) PO CAPS: 50000.0000 [IU] | ORAL_CAPSULE | ORAL | 0 refills | Status: DC

## 2020-02-07 ENCOUNTER — Telehealth: Payer: Self-pay | Admitting: Family Medicine

## 2020-02-07 MED ORDER — TRINTELLIX 5 MG PO TABS: 5.0000 mg | ORAL_TABLET | Freq: Every day | ORAL | 0 refills | Status: DC

## 2020-02-07 NOTE — Telephone Encounter (Signed)
Patient states abd pain, and would like Dr [name] to send a medication into pharmacy.  Patient also would like someone to go over lab results with him. Patient seen on 12/2019.

## 2020-02-07 NOTE — Telephone Encounter (Signed)
These are new symptoms. I will address next week. Let's hold off on increasing dosage of Trintellix until follow up as there may be another issue going on. Will reorder 5 mg/d dosage. Ty.

## 2020-02-07 NOTE — Telephone Encounter (Signed)
Patient is still having the same symptoms/diarrhea and vomiting, has been going on 4 to 5 weeks.. Patient was given verbally his lab results. He does not have any trintellix and is ok to increase Schedule him an appt. Next week.

## 2020-02-07 NOTE — Telephone Encounter (Signed)
Called left message to call back 

## 2020-02-10 ENCOUNTER — Encounter: Payer: Self-pay | Admitting: Physical Therapy

## 2020-02-10 ENCOUNTER — Other Ambulatory Visit: Payer: Self-pay

## 2020-02-10 ENCOUNTER — Ambulatory Visit: Payer: Medicare Other | Attending: Family Medicine | Admitting: Physical Therapy

## 2020-02-10 ENCOUNTER — Telehealth: Payer: Self-pay

## 2020-02-10 DIAGNOSIS — M545 Low back pain, unspecified: Secondary | ICD-10-CM

## 2020-02-10 DIAGNOSIS — M25511 Pain in right shoulder: Secondary | ICD-10-CM | POA: Diagnosis not present

## 2020-02-10 DIAGNOSIS — G8929 Other chronic pain: Secondary | ICD-10-CM

## 2020-02-10 DIAGNOSIS — R2681 Unsteadiness on feet: Secondary | ICD-10-CM | POA: Insufficient documentation

## 2020-02-10 DIAGNOSIS — R2689 Other abnormalities of gait and mobility: Secondary | ICD-10-CM | POA: Diagnosis not present

## 2020-02-10 DIAGNOSIS — M25611 Stiffness of right shoulder, not elsewhere classified: Secondary | ICD-10-CM

## 2020-02-10 DIAGNOSIS — M6281 Muscle weakness (generalized): Secondary | ICD-10-CM | POA: Diagnosis not present

## 2020-02-10 NOTE — Therapy (Signed)
[facility] [place], [place], [address] Phone: [phone]   Fax:  [phone]  Physical Therapy Evaluation  Patient Details  Name: Shawn Meza MRN: [id number] Date of Birth: Jun 24, 1946 Referring Provider (PT): [name], [place]   Encounter Date: 02/10/2020   PT End of Session - 02/10/20 1341    Visit Number 1    Number of Visits 16    Date for PT Re-Evaluation 04/06/20    Authorization Type UHC MCR    Authorization Time Period KX after 15th visit    Progress Note Due on Visit 10    PT Start Time 1345    PT Stop Time 1445    PT Time Calculation (min) 60 min    Activity Tolerance Patient tolerated treatment well    Behavior During Therapy [facility] for tasks assessed/performed           Past Medical History:  Diagnosis Date  . Arthritis   . BPH (benign prostatic hypertrophy)   . Chronic coronary artery disease   . Colon cancer ([place])   . Elevated PSA   . Erectile dysfunction   . Essential hypertension   . Essential tremor   . GERD (gastroesophageal reflux disease)   . Headache(784.0)   . Hiatal hernia   . Hypercholesterolemia   . Long-term use of aspirin therapy   . Major depression, chronic   . Medial meniscus tear 10/11/2011  . Metabolic syndrome   . Morbid obesity ([place])   . Nephrolithiasis    hx of  . NSTEMI (non-ST elevated myocardial infarction) ([place])   . Parkinson's disease ([place])   . S/P CABG (coronary artery bypass graft)   . Transient ischemic attack    hx of  . Trochanteric bursitis of right hip     Past Surgical History:  Procedure Laterality Date  . CARDIAC CATHETERIZATION  5/12,1/13   4 stents placed  . COLON SURGERY    . CORONARY ARTERY BYPASS GRAFT    . KNEE ARTHROSCOPY  10/11/2011   Procedure: ARTHROSCOPY KNEE;  Surgeon: Lorn Junes, MD;  Location: [facility];  Service: Orthopedics;  Laterality: Left;  Left Knee Arthroscopy with Medial and Lateral  Partial Menisectomy, Chondroplasty  . LEFT HEART CATHETERIZATION WITH CORONARY ANGIOGRAM N/A 08/25/2011   Procedure: LEFT HEART CATHETERIZATION WITH CORONARY ANGIOGRAM;  Surgeon: Burnell Blanks, MD;  Location: [facility] CATH LAB;  Service: Cardiovascular;  Laterality: N/A;  . LITHOTRIPSY    . STERIOD INJECTION  10/11/2011   Procedure: STEROID INJECTION;  Surgeon: Lorn Junes, MD;  Location: [facility];  Service: Orthopedics;  Laterality: Right;  Steroid Injection Second Toe  . TRANSURETHRAL RESECTION OF PROSTATE    . URETHRAL DILATION      There were no vitals filed for this visit.    Subjective Assessment - 02/10/20 1339    Subjective Patients reports lower back pain for many years, they did an MRI on his back and he had injection about a month ago. The injection has helped the pain of his lower back but he continues to get tightness when he stands for around 15 minutes. He hurt his right shoulder when he fell and landed on the right shoulder around 6 months ago. He also re-injured the shoulder when he raised it wrong a few weeks ago. He did have neuro rehab a few months ago for Parkinsons, but his back has given him so much trouble he hasn't been able to  do his exercises.    Pertinent History Parkinson disease, depression/anxiety, hx of CABG x5    Limitations Standing;Walking;House hold activities;Lifting    How long can you sit comfortably? No limitation    How long can you stand comfortably? 15 minutes    How long can you walk comfortably? 15 minutes    Diagnostic tests MRI    Patient Stated Goals Get back and shoulder better so he can be more active    Currently in Pain? Yes    Pain Score 6     Pain Location Back    Pain Orientation Lower    Pain Descriptors / Indicators Aching;Tightness    Pain Type Chronic pain    Pain Radiating Towards left upper buttock region    Pain Onset More than a month ago    Pain Frequency Constant    Aggravating Factors  Standing,  walking, lifting, bending    Pain Relieving Factors Sitting, rest    Effect of Pain on Daily Activities Patient is limited with tasks around house that require standing or bending over    Multiple Pain Sites Yes    Pain Score 0   8/10 with raising overhead   Pain Location Shoulder    Pain Orientation Right    Pain Descriptors / Indicators Aching;Sharp    Pain Type Chronic pain    Pain Onset More than a month ago    Pain Frequency Intermittent    Aggravating Factors  Raising arm, lifting    Pain Relieving Factors Rest    Effect of Pain on Daily Activities Reaching or lifting objects overhead              [facility] PT Assessment - 02/10/20 0001      Assessment   Medical Diagnosis Chronic right shoulder pain, Chronic bilateral low back pain    Referring Provider (PT) [name], [place], DO    Onset Date/Surgical Date --   Lumbar pain for years, right shoulder pain 6 months   Hand Dominance Right    Next MD Visit 02/11/2020    Prior Therapy Yes - Neuro for Parkinson disease      Precautions   Precautions None      Restrictions   Weight Bearing Restrictions No      Balance Screen   Has the patient fallen in the past 6 months Yes    How many times? 1 - doesn't remember exact date but is when he injured right shoulder    Has the patient had a decrease in activity level because of a fear of falling?  No    Is the patient reluctant to leave their home because of a fear of falling?  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      Cognition   Overall Cognitive Status Within Functional Limits for tasks assessed      Observation/Other Assessments   Observations Patient appears in no apparent  distress, resting temor related to Parkinson disease    Focus on Therapeutic Outcomes (FOTO)  Shoulder: 47% limitation, Lumbar: 64% limitation  Posture/Postural Control   Posture Comments Patient exhibits rounded shoulder posture, right shoulder shrug at rest      ROM / Strength   AROM / PROM / Strength AROM;PROM;Strength      AROM   AROM Assessment Site Lumbar;Shoulder    Right/Left Shoulder Right    Right Shoulder Flexion 100 Degrees   shoulder shrug   Right Shoulder ABduction 90 Degrees   shoulder shrug   Lumbar Flexion 50% - fingertips to knee    Lumbar Extension 25%    Lumbar - Right Side Bend 25%    Lumbar - Left Side Bend 25%    Lumbar - Right Rotation 50%    Lumbar - Left Rotation 50%      PROM   Overall PROM Comments Hip PROM grossly WFL, patient reports tighness of hips with all movements      Strength   Strength Assessment Site Shoulder;Hip;Knee    Right/Left Shoulder Right;Left    Right Shoulder Flexion 4-/5    Right Shoulder ABduction 4-/5    Right Shoulder External Rotation 4-/5    Left Shoulder Flexion 4/5    Left Shoulder ABduction 4/5    Left Shoulder External Rotation 4/5    Right/Left Hip Right;Left    Right Hip Flexion 4-/5    Right Hip Extension 3+/5    Right Hip ABduction 3/5    Left Hip Flexion 4-/5    Left Hip Extension 3+/5    Left Hip ABduction 3/5    Right/Left Knee Right;Left    Right Knee Flexion 4/5    Right Knee Extension 4/5    Left Knee Flexion 4/5    Left Knee Extension 4/5      Flexibility   Soft Tissue Assessment /Muscle Length yes    Hamstrings Limited bilaterally    Piriformis Limited bilaterally      Palpation   Spinal mobility Not assessed    Palpation comment Lumbar paraspinal tenderness, greater on left      Special Tests    Special Tests Rotator Cuff Impingement;Lumbar    Rotator Cuff Impingment tests Michel Bickers test    Lumbar Tests Straight Leg Raise      Hawkins-Kennedy test   Findings Positive       Straight Leg Raise   Findings Negative      Transfers   Transfers Independent with all Transfers                      Objective measurements completed on examination: See above findings.       [place] Adult PT Treatment/Exercise - 02/10/20 0001      Exercises   Exercises Shoulder;Lumbar      Lumbar Exercises: Stretches   Passive Hamstring Stretch 2 reps;20 seconds    Passive Hamstring Stretch Limitations seated edge of mat    Single Knee to Chest Stretch 2 reps;20 seconds    Single Knee to Chest Stretch Limitations supine    Lower Trunk Rotation 5 reps;10 seconds    Piriformis Stretch 2 reps;20 seconds    Piriformis Stretch Limitations supine      Lumbar Exercises: Supine   Clam 10 reps    Clam Limitations red band    Straight Leg Raise 10 reps      Shoulder Exercises: Seated   Row 10 reps    Theraband Level (Shoulder Row) Level 2 (Red)  PT Education - 02/10/20 1340    Education Details Exam findings, POC, HEP    Person(s) Educated Patient    Methods Explanation;Demonstration;Verbal cues;Tactile cues;Handout    Comprehension Verbalized understanding;Returned demonstration;Verbal cues required;Tactile cues required;Need further instruction            PT Short Term Goals - 02/10/20 1631      PT SHORT TERM GOAL #1   Title Patient will be I with initial HEP to progress with PT    Time 4    Period Weeks    Status New    Target Date 03/09/20      PT SHORT TERM GOAL #2   Title Patient will report improved standing ability >/= 20 minutes to improve ability to perform tasks around house and in yard    Time 4    Period Weeks    Status New    Target Date 03/09/20      PT SHORT TERM GOAL #3   Title Patient will be able to place a light object (</= 3 lbs) into shelf at shoulder height with only min difficulty    Time 4    Period Weeks    Status New    Target Date 04/06/20      PT SHORT TERM GOAL #4   Title Patient will  report no difficulty getting in/out of a chair to improve transfer ability    Time 4    Period Weeks    Status New    Target Date 03/09/20             PT Long Term Goals - 02/10/20 1641      PT LONG TERM GOAL #1   Title Patient will be I with final HEP to maintain progress from PT    Time 8    Period Weeks    Status New    Target Date 04/06/20      PT LONG TERM GOAL #2   Title Patient will report improved functional level of shoulder to </= 38% limitation, lumbar to </= 52% limitation    Time 8    Period Weeks    Status New    Target Date 04/06/20      PT LONG TERM GOAL #3   Title Patient will exhibit lumbar improvement of >/= 50% in all directions to improve ability to dress and perform self care without limitation    Time 8    Period Weeks    Status New    Target Date 04/06/20      PT LONG TERM GOAL #4   Title Patient will report no limitation with performing activities around home or with standing tasks    Time 8    Period Weeks    Status New    Target Date 04/06/20      PT LONG TERM GOAL #5   Title Patient will exhibit improved right shoulder strength grossly >/= 4+/5 MMT to be able to lift gallon of milk back into fridge without assist from other arm    Time 8    Period Weeks    Status New    Target Date 04/06/20                  Plan - 02/10/20 1613    Clinical Impression Statement Patient reports to PT with chronic low back and right shoulder pain. In regard to his lower back he did not report any radicular symptoms, he demonstrates limitation with lumbar mobility  and hip flexibility, decreased core and hip strength. Patient's right shoulder pain seems most consistent with rotator cuff injury with impingement symptoms, he demonstrates limited active motion with strength deficit of rotator cuff. Patient does have Parkinson disease and low activity level that is likely contributing to his symtpoms, and patient notes history of depression that affects  his motivation to be active. He was provided with exercises this visit to improve mobility and initiate light strengthening, and he would benefit from continued skilled PT to improve his ability to stand and walker longer periods of time to be able to perform household tasks and activity without limitation    Personal Factors and Comorbidities Age;Fitness;Past/Current Experience;Time since onset of injury/illness/exacerbation;Comorbidity 3+    Comorbidities Parkinson disease, depression/anxiety, hx of CABG x5    Examination-Activity Limitations Locomotion Level;Reach Overhead;Stand;Lift;Dressing;Carry;Bend    Examination-Participation Restrictions Meal Prep;Cleaning;Community Activity;Shop;Yard Work;Laundry    Stability/Clinical Decision Making Evolving/Moderate complexity    Clinical Decision Making Moderate    Rehab Potential Good    PT Frequency 2x / week    PT Duration 8 weeks    PT Treatment/Interventions ADLs/Self Care Home Management;Cryotherapy;Electrical Stimulation;Moist Heat;Iontophoresis 4mg /ml Dexamethasone;Traction;Ultrasound;Neuromuscular re-education;Balance training;Therapeutic exercise;Therapeutic activities;Functional mobility training;Stair training;Gait training;Patient/family education;Manual techniques;Dry needling;Passive range of motion;Taping;Spinal Manipulations;Joint Manipulations    PT Next Visit Plan Assess HEP and progress PRN, manual/stretching for hip and lumbar mobility, progress core/hip and postural control, light rotator cuff strengthening as able    PT Home Exercise Plan [email or web address]: supine single knee to chest stretch, supine piriformis stretch, LTR, supine clamshell with red, SLR, seated hamstring stretch, seated row with patient's resistance tubing    Consulted and Agree with Plan of Care Patient           Patient will benefit from skilled therapeutic intervention in order to improve the following deficits and impairments:  Decreased range of motion,  Difficulty walking, Abnormal gait, Decreased activity tolerance, Pain, Postural dysfunction, Decreased strength  Visit Diagnosis: Chronic bilateral low back pain, unspecified whether sciatica present  Chronic right shoulder pain  Stiffness of right shoulder, not elsewhere classified  Muscle weakness (generalized)     Problem List Patient Active Problem List   Diagnosis Date Noted  . Fatigue 01/15/2019  . Chronic systolic (congestive) heart failure ([place]) 09/18/2018  . Ischemic cardiomyopathy 09/16/2018  . Aortic regurgitation 09/16/2018  . Cardiomyopathy, unspecified ([place]) 06/13/2018  . Abscess of right axilla 12/12/2017  . BMI 33.0-33.9,adult 12/12/2017  . 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  . Acute blood loss anemia 10/25/2017  . Acute postoperative respiratory insufficiency 10/25/2017  . Postoperative delirium 10/25/2017  . Dyslipidemia 10/17/2017  . SOB (shortness of breath) 03/31/2017  . Long term current use of aspirin 03/29/2017  . Parkinson's disease ([place]) 01/02/2017  . Morbid (severe) obesity due to excess calories ([place]) 10/27/2015  . Memory change 07/06/2015  . Depression 07/06/2015  . Medial meniscus tear 10/11/2011  . Neuroma of foot 10/11/2011  . Coronary artery disease involving native coronary artery of native heart with angina pectoris ([place]) 12/28/2010  . 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 Mixed hyperlipidemia 05/20/2010  . Hypertensive heart disease with heart failure ([place]) 05/20/2010  . 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 GERD 05/20/2010  . TRANSIENT ISCHEMIC ATTACK, HX OF 05/20/2010  . NEPHROLITHIASIS, HX OF 05/20/2010  . BENIGN PROSTATIC HYPERTROPHY, HX OF, S/P TURP 05/20/2010    Hilda Blades, PT, DPT, LAT, ATC 02/10/20  6:07 PM Phone: [phone] Fax: [facility]  [place], [place], [address] Phone: [phone]   Fax:   [phone]  Name: Shawn Meza MRN: [id number] Date of Birth: March 25, 1946

## 2020-02-10 NOTE — Telephone Encounter (Signed)
Nurse Assessment Nurse: Jasmine Pang, RN, Olivia Mackie Date/Time Eilene Ghazi Time): 02/07/2020 9:18:10 PM Please select the assessment type ---Standing order Additional Documentation ---requested med for nausea Other current medications? ---Unknown Medication allergies? ---No Pharmacy name and phone number. ---[facility] [phone] Additional Documentation ---Zofran ODT 4mg . 1-2 every 8 hrs as neeeded. #6 with no refills Guidelines Guideline Title Affirmed Question Affirmed Notes Nurse Date/Time (Eastern Time) Disp. Time Eilene Ghazi Time) Disposition Final User 02/07/2020 9:19:50 PM Clinical Call Yes Jasmine Pang, RN, Olivia Mackie Standing Orders Preparation Additional Instructions Route Frequency Duration Nurse Comments User Name Zofran 4mg  (regular tablet or ODT) 1-2 tablets as needed, # 6 Oral Every 8 Hours Jasmine Pang, Therapist, sports, Olivia Mackie

## 2020-02-10 NOTE — Patient Instructions (Signed)
Access Code: [email or web address] URL: [email or web address] Date: 02/10/2020 Prepared by: Hilda Blades  Exercises Hooklying Single Knee to Chest Stretch - 2 x daily - 7 x weekly - 2 sets - 10 reps Supine Piriformis Stretch with Foot on Ground - 2 x daily - 7 x weekly - 2 sets - 10 reps Supine Lower Trunk Rotation - 2 x daily - 7 x weekly - 2 sets - 10 reps Hooklying Clamshell with Resistance - 2 x daily - 7 x weekly - 2 sets - 10 reps Supine Active Straight Leg Raise - 2 x daily - 7 x weekly - 2 sets - 10 reps Seated Hamstring Stretch - 2 x daily - 7 x weekly - 3 sets - 10 reps Seated Shoulder Row with Anchored Resistance - 2 x daily - 7 x weekly - 2 sets - 10 reps

## 2020-02-10 NOTE — Telephone Encounter (Signed)
Patient has appointment with PCP tomorrow 02/11/2020

## 2020-02-10 NOTE — Telephone Encounter (Signed)
Called left message to call back 

## 2020-02-11 ENCOUNTER — Ambulatory Visit (INDEPENDENT_AMBULATORY_CARE_PROVIDER_SITE_OTHER): Payer: Medicare Other | Admitting: Family Medicine

## 2020-02-11 ENCOUNTER — Telehealth: Payer: Self-pay

## 2020-02-11 ENCOUNTER — Encounter: Payer: Self-pay | Admitting: Family Medicine

## 2020-02-11 VITALS — BP 120/68 | HR 78 | Temp 98.0°F | Ht 66.0 in | Wt 209.0 lb

## 2020-02-11 DIAGNOSIS — J302 Other seasonal allergic rhinitis: Secondary | ICD-10-CM | POA: Diagnosis not present

## 2020-02-11 DIAGNOSIS — R5383 Other fatigue: Secondary | ICD-10-CM | POA: Diagnosis not present

## 2020-02-11 DIAGNOSIS — F339 Major depressive disorder, recurrent, unspecified: Secondary | ICD-10-CM

## 2020-02-11 DIAGNOSIS — R110 Nausea: Secondary | ICD-10-CM

## 2020-02-11 DIAGNOSIS — R197 Diarrhea, unspecified: Secondary | ICD-10-CM | POA: Diagnosis not present

## 2020-02-11 MED ORDER — VORTIOXETINE HBR 5 MG PO TABS: 10.0000 mg | ORAL_TABLET | Freq: Every day | ORAL | 2 refills | Status: DC

## 2020-02-11 MED ORDER — ONDANSETRON 4 MG PO TBDP: 4.0000 mg | ORAL_TABLET | Freq: Three times a day (TID) | ORAL | 0 refills | Status: DC | PRN

## 2020-02-11 MED ORDER — LEVOCETIRIZINE DIHYDROCHLORIDE 5 MG PO TABS: 5.0000 mg | ORAL_TABLET | Freq: Every evening | ORAL | 2 refills | Status: DC

## 2020-02-11 NOTE — Telephone Encounter (Signed)
Pt's insurance does not cover Trintellix 5mg : 2 tabs daily. Okay to change to Trintellix 10mg  tablets?

## 2020-02-11 NOTE — Patient Instructions (Addendum)
If you do not hear anything about your referral in the next 1-2 weeks, call our office and ask for an update.  Keep the diet clean and stay active.  Claritin (loratadine), Allegra (fexofenadine), Zyrtec (cetirizine) which is also equivalent to Xyzal (levocetirizine); these are listed in order from weakest to strongest. Generic, and therefore cheaper, options are in the parentheses.   Flonase (fluticasone); nasal spray that is over the counter. 2 sprays each nostril, once daily. Aim towards the same side eye when you spray.  There are available OTC, and the generic versions, which may be cheaper, are in parentheses. Show this to a pharmacist if you have trouble finding any of these items.  You don't need to return the stool kit if you no longer have diarrhea.   Let us know if you need anything.

## 2020-02-11 NOTE — Telephone Encounter (Signed)
Taken care of already// per PCP instructions.

## 2020-02-11 NOTE — Telephone Encounter (Signed)
I think you already took care of this, but yes. Ty.

## 2020-02-11 NOTE — Progress Notes (Signed)
Chief Complaint  Patient presents with  . Diarrhea    needs Psy. referral  . Emesis    patient does not use My Chart  . Fatigue    Subjective: Patient is a 74 y.o. male here for f/u fatigue.  Pt requesting a psychology referral. He is taking Trintellix 5 mg/d. No AE's, compliant. Increased anxiety/depression. Not currently following with a counselor, interested in seeing one. No SI or HI.  Has had several weeks of N/V/D w some belly pain. Last episode of diarrhea yesterday. No sick contacts, bleeding, fevers. No recent abx use. Pt does having long standing allergies/congestion, worse in AM and drainage causes upset stomach. Takes Dramamine w minimal relief.   Fatigue continues. He was placed on Vit D, has not noticed a difference. While he does not snore or wake up gasping for air, he wakes up feeling unrefreshed. Has not had sleep study.   Past Medical History:  Diagnosis Date  . Arthritis   . BPH (benign prostatic hypertrophy)   . Chronic coronary artery disease   . Colon cancer ([place])   . Elevated PSA   . Erectile dysfunction   . Essential hypertension   . Essential tremor   . GERD (gastroesophageal reflux disease)   . Headache(784.0)   . Hiatal hernia   . Hypercholesterolemia   . Long-term use of aspirin therapy   . Major depression, chronic   . Medial meniscus tear 10/11/2011  . Metabolic syndrome   . Morbid obesity ([place])   . Nephrolithiasis    hx of  . NSTEMI (non-ST elevated myocardial infarction) ([place])   . Parkinson's disease ([place])   . S/P CABG (coronary artery bypass graft)   . Transient ischemic attack    hx of  . Trochanteric bursitis of right hip     Objective: BP 120/68 (BP Location: Left Arm, Patient Position: Sitting, Cuff Size: Normal)   Pulse 78   Temp 98 F (36.7 C) (Oral)   Ht 5\' 6"  (1.676 m)   Wt 209 lb (94.8 kg)   SpO2 97%   BMI 33.73 kg/m  General: Awake, appears stated age HEENT: MMM, EOMi Heart: RRR, no LE edema, no bruits Abd: BS+,  ND, mild ttp diffusely Neuro: +resting tremor Lungs: CTAB, no rales, wheezes or rhonchi. No accessory muscle use Psych: Age appropriate judgment and insight, normal affect and mood  Assessment and Plan: Depression, recurrent ([place]) - Plan: vortioxetine HBr (TRINTELLIX) 5 MG TABS tablet, Ambulatory referral to Psychology, CANCELED: Ambulatory referral to Psychiatry  Fatigue, unspecified type - Plan: Ambulatory referral to Pulmonology  Seasonal allergies - Plan: levocetirizine (XYZAL) 5 MG tablet  Diarrhea, unspecified type - Plan: Stool Culture, Ova and parasite examination, CANCELED: Stool Culture, CANCELED: Ova and parasite examination  Nausea - Plan: ondansetron (ZOFRAN-ODT) 4 MG disintegrating tablet  1-refer psychology. Increase Trintellix from 5 mg/d to 10 mg/d.  2- Refer pulm for OSA eval.  3- Start Xyzal for allergies/congestion.  4- Ck culture. He might be done.  5- symptomatic management.  F/u in 1 mo. The patient voiced understanding and agreement to the plan.  Gans, DO 02/11/20  3:35 PM

## 2020-02-13 ENCOUNTER — Emergency Department (HOSPITAL_COMMUNITY)
Admission: EM | Admit: 2020-02-13 | Discharge: 2020-02-13 | Disposition: A | Discharge status: Home / Self Care | Payer: Medicare Other | Attending: Emergency Medicine | Admitting: Emergency Medicine

## 2020-02-13 ENCOUNTER — Other Ambulatory Visit: Payer: Self-pay

## 2020-02-13 ENCOUNTER — Encounter (HOSPITAL_COMMUNITY): Payer: Self-pay | Admitting: *Deleted

## 2020-02-13 DIAGNOSIS — K5900 Constipation, unspecified: Secondary | ICD-10-CM | POA: Insufficient documentation

## 2020-02-13 DIAGNOSIS — Z5321 Procedure and treatment not carried out due to patient leaving prior to being seen by health care provider: Secondary | ICD-10-CM | POA: Diagnosis not present

## 2020-02-13 DIAGNOSIS — R197 Diarrhea, unspecified: Secondary | ICD-10-CM | POA: Insufficient documentation

## 2020-02-13 LAB — URINALYSIS, ROUTINE W REFLEX MICROSCOPIC
Bacteria, UA: NONE SEEN
Bilirubin Urine: NEGATIVE
Glucose, UA: NEGATIVE mg/dL
Ketones, ur: NEGATIVE mg/dL
Leukocytes,Ua: NEGATIVE
Nitrite: NEGATIVE
Protein, ur: NEGATIVE mg/dL
Specific Gravity, Urine: 1.025 (ref 1.005–1.030)
pH: 5 (ref 5.0–8.0)

## 2020-02-13 LAB — COMPREHENSIVE METABOLIC PANEL
ALT: 31 U/L (ref 0–44)
AST: 32 U/L (ref 15–41)
Albumin: 3.9 g/dL (ref 3.5–5.0)
Alkaline Phosphatase: 61 U/L (ref 38–126)
Anion gap: 12 (ref 5–15)
BUN: 21 mg/dL (ref 8–23)
CO2: 22 mmol/L (ref 22–32)
Calcium: 9.1 mg/dL (ref 8.9–10.3)
Chloride: 106 mmol/L (ref 98–111)
Creatinine, Ser: 1.16 mg/dL (ref 0.61–1.24)
GFR calc Af Amer: >60 mL/min (ref >60)
GFR calc non Af Amer: >60 mL/min (ref >60)
Glucose, Bld: 95 mg/dL (ref 70–99)
Potassium: 4.4 mmol/L (ref 3.5–5.1)
Sodium: 140 mmol/L (ref 135–145)
Total Bilirubin: 1.1 mg/dL (ref 0.3–1.2)
Total Protein: 6.8 g/dL (ref 6.5–8.1)

## 2020-02-13 LAB — LIPASE, BLOOD: Lipase: 30 U/L (ref 11–51)

## 2020-02-13 LAB — CBC
HCT: 48.1 % (ref 39.0–52.0)
Hemoglobin: 15.9 g/dL (ref 13.0–17.0)
MCH: 31.7 pg (ref 26.0–34.0)
MCHC: 33.1 g/dL (ref 30.0–36.0)
MCV: 96 fL (ref 80.0–100.0)
Platelets: 245 10*3/uL (ref 150–400)
RBC: 5.01 MIL/uL (ref 4.22–5.81)
RDW: 12.6 % (ref 11.5–15.5)
WBC: 13.5 10*3/uL — ABNORMAL HIGH (ref 4.0–10.5)
nRBC: 0 % (ref 0.0–0.2)

## 2020-02-13 MED ORDER — SODIUM CHLORIDE 0.9% FLUSH: 3.0000 mL | Freq: Once | INTRAVENOUS | Status: DC

## 2020-02-13 NOTE — ED Notes (Signed)
Pt has decided to leave will follow up wit PCP. Advised pt to come back if he starts feeling worse.

## 2020-02-13 NOTE — ED Triage Notes (Signed)
The pt is c/o being ill for 2 weeks with constipation and diarrhea   No pain at preent unable to have a bm today

## 2020-02-18 ENCOUNTER — Ambulatory Visit: Payer: Medicare Other

## 2020-02-18 ENCOUNTER — Other Ambulatory Visit: Payer: Self-pay

## 2020-02-18 DIAGNOSIS — G8929 Other chronic pain: Secondary | ICD-10-CM

## 2020-02-18 DIAGNOSIS — M545 Low back pain, unspecified: Secondary | ICD-10-CM

## 2020-02-18 DIAGNOSIS — R2689 Other abnormalities of gait and mobility: Secondary | ICD-10-CM

## 2020-02-18 DIAGNOSIS — M25611 Stiffness of right shoulder, not elsewhere classified: Secondary | ICD-10-CM

## 2020-02-18 DIAGNOSIS — M6281 Muscle weakness (generalized): Secondary | ICD-10-CM

## 2020-02-18 DIAGNOSIS — R2681 Unsteadiness on feet: Secondary | ICD-10-CM

## 2020-02-18 DIAGNOSIS — M25511 Pain in right shoulder: Secondary | ICD-10-CM | POA: Diagnosis not present

## 2020-02-18 NOTE — Therapy (Signed)
[place], [place], [address] Phone: [phone]   Fax:  [phone]  Physical Therapy Treatment  Patient Details  Name: Shawn Meza MRN: [id number] Date of Birth: 1946/07/15 Referring Provider (PT): [name], [place]   Encounter Date: 02/18/2020   PT End of Session - 02/18/20 1517    Visit Number 2    Number of Visits 16    Date for PT Re-Evaluation 04/06/20    Authorization Type UHC MCR    Authorization Time Period KX after 15th visit    Progress Note Due on Visit 10    PT Start Time 1505    PT Stop Time 1545    PT Time Calculation (min) 40 min    Activity Tolerance Patient tolerated treatment well    Behavior During Therapy [facility] for tasks assessed/performed           Past Medical History:  Diagnosis Date  . Arthritis   . BPH (benign prostatic hypertrophy)   . Chronic coronary artery disease   . Colon cancer ([place])   . Elevated PSA   . Erectile dysfunction   . Essential hypertension   . Essential tremor   . GERD (gastroesophageal reflux disease)   . Headache(784.0)   . Hiatal hernia   . Hypercholesterolemia   . Long-term use of aspirin therapy   . Major depression, chronic   . Medial meniscus tear 10/11/2011  . Metabolic syndrome   . Morbid obesity ([place])   . Nephrolithiasis    hx of  . NSTEMI (non-ST elevated myocardial infarction) ([place])   . Parkinson's disease ([place])   . S/P CABG (coronary artery bypass graft)   . Transient ischemic attack    hx of  . Trochanteric bursitis of right hip     Past Surgical History:  Procedure Laterality Date  . CARDIAC CATHETERIZATION  5/12,1/13   4 stents placed  . COLON SURGERY    . CORONARY ARTERY BYPASS GRAFT    . KNEE ARTHROSCOPY  10/11/2011   Procedure: ARTHROSCOPY KNEE;  Surgeon: Lorn Junes, MD;  Location: [facility];  Service: Orthopedics;  Laterality: Left;  Left Knee Arthroscopy with Medial and Lateral  Partial Menisectomy, Chondroplasty  . LEFT HEART CATHETERIZATION WITH CORONARY ANGIOGRAM N/A 08/25/2011   Procedure: LEFT HEART CATHETERIZATION WITH CORONARY ANGIOGRAM;  Surgeon: Burnell Blanks, MD;  Location[facility] CATH LAB;  Service: Cardiovascular;  Laterality: N/A;  . LITHOTRIPSY    . STERIOD INJECTION  10/11/2011   Procedure: STEROID INJECTION;  Surgeon: Lorn Junes, MD;  Location: [facility];  Service: Orthopedics;  Laterality: Right;  Steroid Injection Second Toe  . TRANSURETHRAL RESECTION OF PROSTATE    . URETHRAL DILATION      There were no vitals filed for this visit.   Subjective Assessment - 02/18/20 1508    Subjective Pt reports he has been sick for 2-3 weeks off/on and went to the hospital the other night. They told him it was more depression-related, but he was throwing up and having diarrhea. Then went more toward constipation - he waited 9 hours and was not seen, so he went home. He has been having more cramping since his first session. Not sure if it was from sitting for so long. He reports he hsa been altering his new does of antidepressant because of getting sick on his stomach, but he has not discussed with his MD yet. He sees him next week.  Pertinent History Parkinson disease, depression/anxiety, hx of CABG x5    Limitations Standing;Walking;House hold activities;Lifting    How long can you sit comfortably? No limitation    How long can you stand comfortably? 15 minutes    How long can you walk comfortably? 15 minutes    Diagnostic tests MRI    Patient Stated Goals Get back and shoulder better so he can be more active    Currently in Pain? Yes    Pain Score 5    up to 8/10   Pain Location Back    Pain Orientation Lower    Pain Descriptors / Indicators Cramping    Pain Type Chronic pain    Pain Onset More than a month ago    Pain Onset More than a month ago                             [facility] Adult PT Treatment/Exercise -  02/18/20 0001      Exercises   Exercises Lumbar;Knee/Hip      Lumbar Exercises: Stretches   Lower Trunk Rotation 5 reps;10 seconds    Prone on Elbows Stretch 2 reps;30 seconds    Prone on Elbows Stretch Limitations feet of EOT    Other Lumbar Stretch Exercise S/L thoracic rotation x10 B      Knee/Hip Exercises: Supine   Other Supine Knee/Hip Exercises H/L clams with RTB + PPT x 10       Shoulder Exercises: Standing   External Rotation AROM;Both;5 reps   at corner of doorway   External Rotation Limitations whatevers   unable to depress R shoulder, advised to use mirror at home     Manual Therapy   Manual Therapy Joint mobilization;Soft tissue mobilization;Passive ROM    Manual therapy comments prone    Joint Mobilization lumbar CPAs grade 3, thoracic CPAS grade 3-4    Soft tissue mobilization STM/XFM to R>L lumbar PS    Passive ROM passive quad S B   LLE tighter than R                 PT Education - 02/18/20 1556    Education Details Pt declined printoff of 2 new exercises; edu for HEP daily    Person(s) Educated Patient    Methods Explanation;Demonstration;Tactile cues;Verbal cues    Comprehension Verbalized understanding;Returned demonstration;Verbal cues required;Tactile cues required;Need further instruction            PT Short Term Goals - 02/10/20 1631      PT SHORT TERM GOAL #1   Title Patient will be I with initial HEP to progress with PT    Time 4    Period Weeks    Status New    Target Date 03/09/20      PT SHORT TERM GOAL #2   Title Patient will report improved standing ability >/= 20 minutes to improve ability to perform tasks around house and in yard    Time 4    Period Weeks    Status New    Target Date 03/09/20      PT SHORT TERM GOAL #3   Title Patient will be able to place a light object (</= 3 lbs) into shelf at shoulder height with only min difficulty    Time 4    Period Weeks    Status New    Target Date 04/06/20      PT SHORT  TERM GOAL #  4   Title Patient will report no difficulty getting in/out of a chair to improve transfer ability    Time 4    Period Weeks    Status New    Target Date 03/09/20             PT Long Term Goals - 02/10/20 1641      PT LONG TERM GOAL #1   Title Patient will be I with final HEP to maintain progress from PT    Time 8    Period Weeks    Status New    Target Date 04/06/20      PT LONG TERM GOAL #2   Title Patient will report improved functional level of shoulder to </= 38% limitation, lumbar to </= 52% limitation    Time 8    Period Weeks    Status New    Target Date 04/06/20      PT LONG TERM GOAL #3   Title Patient will exhibit lumbar improvement of >/= 50% in all directions to improve ability to dress and perform self care without limitation    Time 8    Period Weeks    Status New    Target Date 04/06/20      PT LONG TERM GOAL #4   Title Patient will report no limitation with performing activities around home or with standing tasks    Time 8    Period Weeks    Status New    Target Date 04/06/20      PT LONG TERM GOAL #5   Title Patient will exhibit improved right shoulder strength grossly >/= 4+/5 MMT to be able to lift gallon of milk back into fridge without assist from other arm    Time 8    Period Weeks    Status New    Target Date 04/06/20                 Plan - 02/18/20 1518    Clinical Impression Statement Pt presents with worsening off pain, but unknown cause. Pt admitted to not being very diligent with HEP and seemed to not remember how to do his HEP during exercise. Added prone prop up and thoracic rotation secondary to back tightness and cramping reported today. Pt has significant tightness in both quads/HF addressed with some prone passive stretching, which causes difficulty achieving pelvic neutral/PPT in supine with H/L clams. Pt was advised to perform HEP daily. Declined new pictures today and asked if he could return this week for  another session, stating he is not good at doing it on his own.    Personal Factors and Comorbidities Age;Fitness;Past/Current Experience;Time since onset of injury/illness/exacerbation;Comorbidity 3+    Comorbidities Parkinson disease, depression/anxiety, hx of CABG x5    Examination-Activity Limitations Locomotion Level;Reach Overhead;Stand;Lift;Dressing;Carry;Bend    Examination-Participation Restrictions Meal Prep;Cleaning;Community Activity;Shop;Yard Work;Laundry    Stability/Clinical Decision Making Evolving/Moderate complexity    Rehab Potential Good    PT Frequency 2x / week    PT Duration 8 weeks    PT Treatment/Interventions ADLs/Self Care Home Management;Cryotherapy;Electrical Stimulation;Moist Heat;Iontophoresis 4mg /ml Dexamethasone;Traction;Ultrasound;Neuromuscular re-education;Balance training;Therapeutic exercise;Therapeutic activities;Functional mobility training;Stair training;Gait training;Patient/family education;Manual techniques;Dry needling;Passive range of motion;Taping;Spinal Manipulations;Joint Manipulations    PT Next Visit Plan Assess HEP and progress PRN, manual/stretching for hip and lumbar mobility, progress core/hip and postural control, light rotator cuff strengthening as able    PT Home Exercise Plan [id number]: supine single knee to chest stretch, supine piriformis stretch, LTR, supine clamshell with red,  SLR, seated hamstring stretch, seated row with patient's resistance tubing    Consulted and Agree with Plan of Care Patient           Patient will benefit from skilled therapeutic intervention in order to improve the following deficits and impairments:  Decreased range of motion, Difficulty walking, Abnormal gait, Decreased activity tolerance, Pain, Postural dysfunction, Decreased strength  Visit Diagnosis: Chronic bilateral low back pain, unspecified whether sciatica present  Chronic right shoulder pain  Stiffness of right shoulder, not elsewhere  classified  Muscle weakness (generalized)  Unsteadiness on feet  Other abnormalities of gait and mobility     Problem List Patient Active Problem List   Diagnosis Date Noted  . Fatigue 01/15/2019  . Chronic systolic (congestive) heart failure ([place]) 09/18/2018  . Ischemic cardiomyopathy 09/16/2018  . Aortic regurgitation 09/16/2018  . Cardiomyopathy, unspecified ([place]) 06/13/2018  . Abscess of right axilla 12/12/2017  . BMI 33.0-33.9,adult 12/12/2017  . 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  . Acute blood loss anemia 10/25/2017  . Acute postoperative respiratory insufficiency 10/25/2017  . Postoperative delirium 10/25/2017  . Dyslipidemia 10/17/2017  . SOB (shortness of breath) 03/31/2017  . Long term current use of aspirin 03/29/2017  . Parkinson's disease ([place]) 01/02/2017  . Morbid (severe) obesity due to excess calories ([place]) 10/27/2015  . Memory change 07/06/2015  . Depression, recurrent ([place]) 07/06/2015  . Medial meniscus tear 10/11/2011  . Neuroma of foot 10/11/2011  . Coronary artery disease involving native coronary artery of native heart with angina pectoris ([place]) 12/28/2010  . 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 Mixed hyperlipidemia 05/20/2010  . Hypertensive heart disease with heart failure ([place]) 05/20/2010  . 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 GERD 05/20/2010  . TRANSIENT ISCHEMIC ATTACK, HX OF 05/20/2010  . NEPHROLITHIASIS, HX OF 05/20/2010  . BENIGN PROSTATIC HYPERTROPHY, HX OF, S/P TURP 05/20/2010    Izell Meraux, PT, DPT 02/18/2020, 4:03 PM  [facility] [address] [place], [place], [address] Phone: [phone]   Fax:  [phone]  Name: Shawn Meza MRN: [id number] Date of Birth: [birth year]

## 2020-02-20 ENCOUNTER — Other Ambulatory Visit: Payer: Self-pay | Admitting: Cardiology

## 2020-02-25 ENCOUNTER — Encounter: Payer: Self-pay | Admitting: Physical Therapy

## 2020-02-25 ENCOUNTER — Ambulatory Visit: Payer: Medicare Other | Admitting: Physical Therapy

## 2020-02-25 ENCOUNTER — Other Ambulatory Visit: Payer: Self-pay

## 2020-02-25 DIAGNOSIS — G8929 Other chronic pain: Secondary | ICD-10-CM

## 2020-02-25 DIAGNOSIS — R2689 Other abnormalities of gait and mobility: Secondary | ICD-10-CM | POA: Diagnosis not present

## 2020-02-25 DIAGNOSIS — M25511 Pain in right shoulder: Secondary | ICD-10-CM | POA: Diagnosis not present

## 2020-02-25 DIAGNOSIS — R2681 Unsteadiness on feet: Secondary | ICD-10-CM | POA: Diagnosis not present

## 2020-02-25 DIAGNOSIS — M25611 Stiffness of right shoulder, not elsewhere classified: Secondary | ICD-10-CM

## 2020-02-25 DIAGNOSIS — M6281 Muscle weakness (generalized): Secondary | ICD-10-CM | POA: Diagnosis not present

## 2020-02-25 DIAGNOSIS — M545 Low back pain, unspecified: Secondary | ICD-10-CM

## 2020-02-25 NOTE — Therapy (Signed)
[place], [place], [address] Phone: [phone]   Fax:  [phone]  Physical Therapy Treatment  Patient Details  Name: Shawn Meza MRN: [id number] Date of Birth: [birth year] Referring Provider (PT): [name], [place]   Encounter Date: 02/25/2020   PT End of Session - 02/25/20 1538    Visit Number 3    Number of Visits 16    Date for PT Re-Evaluation 04/06/20    Authorization Type UHC MCR    Authorization Time Period KX after 15th visit    Progress Note Due on Visit 10    PT Start Time 1530    PT Stop Time 1615    PT Time Calculation (min) 45 min    Activity Tolerance Patient tolerated treatment well    Behavior During Therapy [facility] for tasks assessed/performed           Past Medical History:  Diagnosis Date  . Arthritis   . BPH (benign prostatic hypertrophy)   . Chronic coronary artery disease   . Colon cancer ([place])   . Elevated PSA   . Erectile dysfunction   . Essential hypertension   . Essential tremor   . GERD (gastroesophageal reflux disease)   . Headache(784.0)   . Hiatal hernia   . Hypercholesterolemia   . Long-term use of aspirin therapy   . Major depression, chronic   . Medial meniscus tear 10/11/2011  . Metabolic syndrome   . Morbid obesity ([place])   . Nephrolithiasis    hx of  . NSTEMI (non-ST elevated myocardial infarction) ([place])   . Parkinson's disease ([place])   . S/P CABG (coronary artery bypass graft)   . Transient ischemic attack    hx of  . Trochanteric bursitis of right hip     Past Surgical History:  Procedure Laterality Date  . CARDIAC CATHETERIZATION  5/12,1/13   4 stents placed  . COLON SURGERY    . CORONARY ARTERY BYPASS GRAFT    . KNEE ARTHROSCOPY  10/11/2011   Procedure: ARTHROSCOPY KNEE;  Surgeon: Lorn Junes, MD;  Location: [place];  Service: Orthopedics;  Laterality: Left;  Left Knee Arthroscopy with Medial and Lateral  Partial Menisectomy, Chondroplasty  . LEFT HEART CATHETERIZATION WITH CORONARY ANGIOGRAM N/A 08/25/2011   Procedure: LEFT HEART CATHETERIZATION WITH CORONARY ANGIOGRAM;  Surgeon: Burnell Blanks, MD;  Location: [facility] CATH LAB;  Service: Cardiovascular;  Laterality: N/A;  . LITHOTRIPSY    . STERIOD INJECTION  10/11/2011   Procedure: STEROID INJECTION;  Surgeon: Lorn Junes, MD;  Location: [place];  Service: Orthopedics;  Laterality: Right;  Steroid Injection Second Toe  . TRANSURETHRAL RESECTION OF PROSTATE    . URETHRAL DILATION      There were no vitals filed for this visit.   Subjective Assessment - 02/25/20 1534    Subjective Patient reports he has still been having a tough few weeks. He feels he has gotten a little better from therapy. He has been doing right much of the exercises.    Patient Stated Goals Get back and shoulder better so he can be more active    Currently in Pain? Yes    Pain Score 4     Pain Location Back    Pain Orientation Lower    Pain Descriptors / Indicators Tightness;Sore    Pain Type Chronic pain    Pain Onset More than a month ago    Pain Frequency Constant  Pain Score 0    Pain Location Shoulder    Pain Orientation Right    Pain Descriptors / Indicators Aching;Sore    Pain Type Chronic pain    Pain Onset More than a month ago    Pain Frequency Intermittent                             [facility] Adult PT Treatment/Exercise - 02/25/20 0001      Exercises   Exercises Shoulder;Lumbar;Knee/Hip      Lumbar Exercises: Stretches   Passive Hamstring Stretch 3 reps;30 seconds    Passive Hamstring Stretch Limitations 2 reps supine, 1 rep seated edge of mat    Single Knee to Chest Stretch 2 reps;30 seconds    Single Knee to Chest Stretch Limitations supine    Lower Trunk Rotation 5 reps;10 seconds    Hip Flexor Stretch 3 reps;30 seconds    Hip Flexor Stretch Limitations supine edge of table      Lumbar Exercises:  Aerobic   Nustep L4 x 5 min (LE only)      Lumbar Exercises: Supine   Clam 10 reps   2 sets   Clam Limitations red band    Bridge 10 reps    Bridge Limitations partial range    Straight Leg Raise 10 reps      Shoulder Exercises: Supine   Flexion AROM;10 reps      Shoulder Exercises: Seated   Row 10 reps   2 sets   Theraband Level (Shoulder Row) Level 2 (Red)      Manual Therapy   Manual Therapy Joint mobilization;Passive ROM    Joint Mobilization Right shoulder and scapular mobs    Passive ROM Right shoulder all directions                  PT Education - 02/25/20 1537    Education Details HEP    Person(s) Educated Patient    Methods Explanation;Demonstration;Verbal cues    Comprehension Verbalized understanding;Returned demonstration;Verbal cues required;Need further instruction            PT Short Term Goals - 02/10/20 1631      PT SHORT TERM GOAL #1   Title Patient will be I with initial HEP to progress with PT    Time 4    Period Weeks    Status New    Target Date 03/09/20      PT SHORT TERM GOAL #2   Title Patient will report improved standing ability >/= 20 minutes to improve ability to perform tasks around house and in yard    Time 4    Period Weeks    Status New    Target Date 03/09/20      PT SHORT TERM GOAL #3   Title Patient will be able to place a light object (</= 3 lbs) into shelf at shoulder height with only min difficulty    Time 4    Period Weeks    Status New    Target Date 04/06/20      PT SHORT TERM GOAL #4   Title Patient will report no difficulty getting in/out of a chair to improve transfer ability    Time 4    Period Weeks    Status New    Target Date 03/09/20             PT Long Term Goals - 02/10/20 1641  PT LONG TERM GOAL #1   Title Patient will be I with final HEP to maintain progress from PT    Time 8    Period Weeks    Status New    Target Date 04/06/20      PT LONG TERM GOAL #2   Title Patient  will report improved functional level of shoulder to </= 38% limitation, lumbar to </= 52% limitation    Time 8    Period Weeks    Status New    Target Date 04/06/20      PT LONG TERM GOAL #3   Title Patient will exhibit lumbar improvement of >/= 50% in all directions to improve ability to dress and perform self care without limitation    Time 8    Period Weeks    Status New    Target Date 04/06/20      PT LONG TERM GOAL #4   Title Patient will report no limitation with performing activities around home or with standing tasks    Time 8    Period Weeks    Status New    Target Date 04/06/20      PT LONG TERM GOAL #5   Title Patient will exhibit improved right shoulder strength grossly >/= 4+/5 MMT to be able to lift gallon of milk back into fridge without assist from other arm    Time 8    Period Weeks    Status New    Target Date 04/06/20                 Plan - 02/25/20 1552    Clinical Impression Statement Patient tolerated therapy well with no adverse effects. He seems to be improving with the therapy, stated that he was initially sore but feels he is moving better and can stand longer. Continued working on flexibiliy as patient exhibits general tightness of low back, hips, hamstrings and quads. Performed manual on right shoulder to improve mobility, patient dues continue to demonstrate strength deficit of rotator cuff on right likely indicative of rotator cuff injury. He require consistent cueing for posture and was given red band for rows to perform at home. Added hip flexor/quad stretch off edge of bed with good tolerance. He would benefit from continued skilled PT to improve his ability to stand and walk longer periods of time to be able to perform household tasks and activity without limitation    PT Treatment/Interventions ADLs/Self Care Home Management;Cryotherapy;Electrical Stimulation;Moist Heat;Iontophoresis 4mg /ml Dexamethasone;Traction;Ultrasound;Neuromuscular  re-education;Balance training;Therapeutic exercise;Therapeutic activities;Functional mobility training;Stair training;Gait training;Patient/family education;Manual techniques;Dry needling;Passive range of motion;Taping;Spinal Manipulations;Joint Manipulations    PT Next Visit Plan Assess HEP and progress PRN, manual/stretching for hip and lumbar mobility, progress core/hip and postural control, light rotator cuff strengthening as able    PT Home Exercise Plan [email or web address]: supine single knee to chest stretch, supine piriformis stretch, LTR, supine hip flexor stretch edge of bed, supine clamshell with red, SLR, seated hamstring stretch, seated row with red    Consulted and Agree with Plan of Care Patient           Patient will benefit from skilled therapeutic intervention in order to improve the following deficits and impairments:  Decreased range of motion, Difficulty walking, Abnormal gait, Decreased activity tolerance, Pain, Postural dysfunction, Decreased strength  Visit Diagnosis: Chronic bilateral low back pain, unspecified whether sciatica present  Chronic right shoulder pain  Stiffness of right shoulder, not elsewhere classified  Muscle weakness (generalized)     Problem  List Patient Active Problem List   Diagnosis Date Noted  . Fatigue 01/15/2019  . Chronic systolic (congestive) heart failure ([place]) 09/18/2018  . Ischemic cardiomyopathy 09/16/2018  . Aortic regurgitation 09/16/2018  . Cardiomyopathy, unspecified ([place]) 06/13/2018  . Abscess of right axilla 12/12/2017  . BMI 33.0-33.9,adult 12/12/2017  . 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  . Acute blood loss anemia 10/25/2017  . Acute postoperative respiratory insufficiency 10/25/2017  . Postoperative delirium 10/25/2017  . Dyslipidemia 10/17/2017  . SOB (shortness of breath) 03/31/2017  . Long term current use of aspirin 03/29/2017  . Parkinson's disease ([place]) 01/02/2017  . Morbid (severe) obesity due to  excess calories ([place]) 10/27/2015  . Memory change 07/06/2015  . Depression, recurrent ([place]) 07/06/2015  . Medial meniscus tear 10/11/2011  . Neuroma of foot 10/11/2011  . Coronary artery disease involving native coronary artery of native heart with angina pectoris ([place]) 12/28/2010  . 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 Mixed hyperlipidemia 05/20/2010  . Hypertensive heart disease with heart failure ([place]) 05/20/2010  . 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 GERD 05/20/2010  . TRANSIENT ISCHEMIC ATTACK, HX OF 05/20/2010  . NEPHROLITHIASIS, HX OF 05/20/2010  . BENIGN PROSTATIC HYPERTROPHY, HX OF, S/P TURP 05/20/2010    Hilda Blades, PT, DPT, LAT, ATC 02/25/20  4:40 PM Phone: [phone] Fax: [facility] [address] [place], [place], [address] Phone: [phone]   Fax:  [phone]  Name: Shawn Meza MRN: [id number] Date of Birth: [birth year]

## 2020-02-27 ENCOUNTER — Ambulatory Visit: Payer: Medicare Other | Admitting: Physical Therapy

## 2020-03-03 ENCOUNTER — Ambulatory Visit: Payer: Medicare Other | Attending: Family Medicine | Admitting: Physical Therapy

## 2020-03-03 ENCOUNTER — Other Ambulatory Visit: Payer: Self-pay | Admitting: Cardiology

## 2020-03-03 ENCOUNTER — Other Ambulatory Visit: Payer: Self-pay

## 2020-03-03 ENCOUNTER — Encounter: Payer: Self-pay | Admitting: Physical Therapy

## 2020-03-03 DIAGNOSIS — Z8673 Personal history of transient ischemic attack (TIA), and cerebral infarction without residual deficits: Secondary | ICD-10-CM | POA: Insufficient documentation

## 2020-03-03 DIAGNOSIS — M25611 Stiffness of right shoulder, not elsewhere classified: Secondary | ICD-10-CM | POA: Diagnosis not present

## 2020-03-03 DIAGNOSIS — I672 Cerebral atherosclerosis: Secondary | ICD-10-CM | POA: Insufficient documentation

## 2020-03-03 DIAGNOSIS — H5330 Unspecified disorder of binocular vision: Secondary | ICD-10-CM | POA: Insufficient documentation

## 2020-03-03 DIAGNOSIS — I10 Essential (primary) hypertension: Secondary | ICD-10-CM | POA: Diagnosis not present

## 2020-03-03 DIAGNOSIS — G20 Parkinson's disease: Secondary | ICD-10-CM | POA: Diagnosis not present

## 2020-03-03 DIAGNOSIS — I2584 Coronary atherosclerosis due to calcified coronary lesion: Secondary | ICD-10-CM

## 2020-03-03 DIAGNOSIS — Z20822 Contact with and (suspected) exposure to covid-19: Secondary | ICD-10-CM | POA: Diagnosis not present

## 2020-03-03 DIAGNOSIS — M545 Low back pain, unspecified: Secondary | ICD-10-CM

## 2020-03-03 DIAGNOSIS — I252 Old myocardial infarction: Secondary | ICD-10-CM | POA: Insufficient documentation

## 2020-03-03 DIAGNOSIS — R2681 Unsteadiness on feet: Secondary | ICD-10-CM | POA: Insufficient documentation

## 2020-03-03 DIAGNOSIS — Z8249 Family history of ischemic heart disease and other diseases of the circulatory system: Secondary | ICD-10-CM | POA: Diagnosis not present

## 2020-03-03 DIAGNOSIS — Z951 Presence of aortocoronary bypass graft: Secondary | ICD-10-CM | POA: Insufficient documentation

## 2020-03-03 DIAGNOSIS — Z7982 Long term (current) use of aspirin: Secondary | ICD-10-CM | POA: Insufficient documentation

## 2020-03-03 DIAGNOSIS — M6281 Muscle weakness (generalized): Secondary | ICD-10-CM

## 2020-03-03 DIAGNOSIS — R2689 Other abnormalities of gait and mobility: Secondary | ICD-10-CM | POA: Diagnosis not present

## 2020-03-03 DIAGNOSIS — I6782 Cerebral ischemia: Secondary | ICD-10-CM | POA: Insufficient documentation

## 2020-03-03 DIAGNOSIS — I2510 Atherosclerotic heart disease of native coronary artery without angina pectoris: Secondary | ICD-10-CM

## 2020-03-03 DIAGNOSIS — J0120 Acute ethmoidal sinusitis, unspecified: Secondary | ICD-10-CM | POA: Insufficient documentation

## 2020-03-03 DIAGNOSIS — G8929 Other chronic pain: Secondary | ICD-10-CM | POA: Insufficient documentation

## 2020-03-03 DIAGNOSIS — I700 Atherosclerosis of aorta: Secondary | ICD-10-CM | POA: Insufficient documentation

## 2020-03-03 DIAGNOSIS — Z79899 Other long term (current) drug therapy: Secondary | ICD-10-CM | POA: Diagnosis not present

## 2020-03-03 DIAGNOSIS — M25511 Pain in right shoulder: Secondary | ICD-10-CM | POA: Insufficient documentation

## 2020-03-03 DIAGNOSIS — I639 Cerebral infarction, unspecified: Secondary | ICD-10-CM | POA: Diagnosis not present

## 2020-03-03 DIAGNOSIS — I6523 Occlusion and stenosis of bilateral carotid arteries: Secondary | ICD-10-CM | POA: Diagnosis not present

## 2020-03-03 DIAGNOSIS — H538 Other visual disturbances: Secondary | ICD-10-CM | POA: Diagnosis not present

## 2020-03-03 DIAGNOSIS — J3489 Other specified disorders of nose and nasal sinuses: Secondary | ICD-10-CM | POA: Diagnosis not present

## 2020-03-03 DIAGNOSIS — H547 Unspecified visual loss: Secondary | ICD-10-CM | POA: Diagnosis not present

## 2020-03-03 DIAGNOSIS — R4182 Altered mental status, unspecified: Secondary | ICD-10-CM | POA: Diagnosis not present

## 2020-03-03 DIAGNOSIS — R05 Cough: Secondary | ICD-10-CM | POA: Diagnosis not present

## 2020-03-04 ENCOUNTER — Encounter: Payer: Self-pay | Admitting: Physical Therapy

## 2020-03-04 NOTE — Therapy (Signed)
[place], [place], [address] Phone: [phone]   Fax:  [phone]  Physical Therapy Treatment  Patient Details  Name: Shawn Meza MRN: [id number] Date of Birth: Mar 10, 1946 Referring Provider (PT): [name], [place]   Encounter Date: 03/03/2020   PT End of Session - 03/03/20 1543    Visit Number 4    Number of Visits 16    Date for PT Re-Evaluation 04/06/20    Authorization Type UHC MCR    Authorization Time Period KX after 15th visit    Progress Note Due on Visit 10    PT Start Time 1532    PT Stop Time 1615   15 minutes of session spent assisting patient figure out how to sign in to MyChart   PT Time Calculation (min) 43 min    Activity Tolerance Patient tolerated treatment well    Behavior During Therapy [facility] for tasks assessed/performed           Past Medical History:  Diagnosis Date  . Arthritis   . BPH (benign prostatic hypertrophy)   . Chronic coronary artery disease   . Colon cancer ([place])   . Elevated PSA   . Erectile dysfunction   . Essential hypertension   . Essential tremor   . GERD (gastroesophageal reflux disease)   . Headache(784.0)   . Hiatal hernia   . Hypercholesterolemia   . Long-term use of aspirin therapy   . Major depression, chronic   . Medial meniscus tear 10/11/2011  . Metabolic syndrome   . Morbid obesity ([place])   . Nephrolithiasis    hx of  . NSTEMI (non-ST elevated myocardial infarction) ([place])   . Parkinson's disease ([place])   . S/P CABG (coronary artery bypass graft)   . Transient ischemic attack    hx of  . Trochanteric bursitis of right hip     Past Surgical History:  Procedure Laterality Date  . CARDIAC CATHETERIZATION  5/12,1/13   4 stents placed  . COLON SURGERY    . CORONARY ARTERY BYPASS GRAFT    . KNEE ARTHROSCOPY  10/11/2011   Procedure: ARTHROSCOPY KNEE;  Surgeon: Lorn Junes, MD;  Location: [place];  Service:  Orthopedics;  Laterality: Left;  Left Knee Arthroscopy with Medial and Lateral Partial Menisectomy, Chondroplasty  . LEFT HEART CATHETERIZATION WITH CORONARY ANGIOGRAM N/A 08/25/2011   Procedure: LEFT HEART CATHETERIZATION WITH CORONARY ANGIOGRAM;  Surgeon: Burnell Blanks, MD;  Location: [facility] CATH LAB;  Service: Cardiovascular;  Laterality: N/A;  . LITHOTRIPSY    . STERIOD INJECTION  10/11/2011   Procedure: STEROID INJECTION;  Surgeon: Lorn Junes, MD;  Location: [place];  Service: Orthopedics;  Laterality: Right;  Steroid Injection Second Toe  . TRANSURETHRAL RESECTION OF PROSTATE    . URETHRAL DILATION      There were no vitals filed for this visit.   Subjective Assessment - 03/03/20 1541    Subjective Patient reports that he is sore today and feels like he has no energy. His knees are also bothering him today.    Patient Stated Goals Get back and shoulder better so he can be more active    Currently in Pain? Yes    Pain Score 4     Pain Location Back    Pain Orientation Lower    Pain Descriptors / Indicators Sore    Pain Type Chronic pain    Pain Onset More than a month  ago    Pain Frequency Constant    Pain Score 2    Pain Location Shoulder    Pain Orientation Right    Pain Descriptors / Indicators Sore    Pain Type Chronic pain    Pain Onset More than a month ago    Pain Frequency Intermittent                             [facility] Adult PT Treatment/Exercise - 03/04/20 0001      Exercises   Exercises Shoulder;Lumbar;Knee/Hip      Lumbar Exercises: Stretches   Passive Hamstring Stretch 2 reps;30 seconds    Passive Hamstring Stretch Limitations supine    Single Knee to Chest Stretch 2 reps;30 seconds    Single Knee to Chest Stretch Limitations supine    Lower Trunk Rotation 5 reps;10 seconds    Hip Flexor Stretch 3 reps;30 seconds    Hip Flexor Stretch Limitations supine edge of table    Piriformis Stretch 2 reps;30 seconds     Other Lumbar Stretch Exercise Seated lumbar flexion with physioball 5 x 10 sec fwd and lateral      Lumbar Exercises: Aerobic   Nustep L4 x 6 min (LE only)      Shoulder Exercises: Supine   Flexion 10 reps;AAROM    Flexion Limitations dowel                  PT Education - 03/03/20 1542    Education Details HEP, patient instructed on using MyChart    Person(s) Educated Patient    Methods Explanation;Demonstration;Verbal cues    Comprehension Verbalized understanding;Returned demonstration;Verbal cues required;Need further instruction            PT Short Term Goals - 02/10/20 1631      PT SHORT TERM GOAL #1   Title Patient will be I with initial HEP to progress with PT    Time 4    Period Weeks    Status New    Target Date 03/09/20      PT SHORT TERM GOAL #2   Title Patient will report improved standing ability >/= 20 minutes to improve ability to perform tasks around house and in yard    Time 4    Period Weeks    Status New    Target Date 03/09/20      PT SHORT TERM GOAL #3   Title Patient will be able to place a light object (</= 3 lbs) into shelf at shoulder height with only min difficulty    Time 4    Period Weeks    Status New    Target Date 04/06/20      PT SHORT TERM GOAL #4   Title Patient will report no difficulty getting in/out of a chair to improve transfer ability    Time 4    Period Weeks    Status New    Target Date 03/09/20             PT Long Term Goals - 02/10/20 1641      PT LONG TERM GOAL #1   Title Patient will be I with final HEP to maintain progress from PT    Time 8    Period Weeks    Status New    Target Date 04/06/20      PT LONG TERM GOAL #2   Title Patient will report improved functional level of  shoulder to </= 38% limitation, lumbar to </= 52% limitation    Time 8    Period Weeks    Status New    Target Date 04/06/20      PT LONG TERM GOAL #3   Title Patient will exhibit lumbar improvement of >/= 50% in all  directions to improve ability to dress and perform self care without limitation    Time 8    Period Weeks    Status New    Target Date 04/06/20      PT LONG TERM GOAL #4   Title Patient will report no limitation with performing activities around home or with standing tasks    Time 8    Period Weeks    Status New    Target Date 04/06/20      PT LONG TERM GOAL #5   Title Patient will exhibit improved right shoulder strength grossly >/= 4+/5 MMT to be able to lift gallon of milk back into fridge without assist from other arm    Time 8    Period Weeks    Status New    Target Date 04/06/20                 Plan - 03/04/20 0824    Clinical Impression Statement Patient tolerated therapy well with no adverse effects. Continued working on general flexibility this visit as patient continues to report stiffness with activity. He did report feeling better following the stretching and movement. Patient expressed concern he has not been able to sign in to his MyChart account and how this has affected his healthcare. 15 minutes of today's visit was spent assisting patient change password and log in to his account so he can communicate with doctors and see visit schedules/test results. He would benefit from continued skilled PT to improve his ability to stand and walk longer periods of time to be able to perform household tasks and activity without limitation    PT Treatment/Interventions ADLs/Self Care Home Management;Cryotherapy;Electrical Stimulation;Moist Heat;Iontophoresis 4mg /ml Dexamethasone;Traction;Ultrasound;Neuromuscular re-education;Balance training;Therapeutic exercise;Therapeutic activities;Functional mobility training;Stair training;Gait training;Patient/family education;Manual techniques;Dry needling;Passive range of motion;Taping;Spinal Manipulations;Joint Manipulations    PT Next Visit Plan Assess HEP and progress PRN, manual/stretching for hip and lumbar mobility, progress core/hip  and postural control, light rotator cuff strengthening as able    PT Home Exercise Plan [email or web address]: supine single knee to chest stretch, supine piriformis stretch, LTR, supine hip flexor stretch edge of bed, supine clamshell with red, SLR, seated hamstring stretch, seated row with red    Consulted and Agree with Plan of Care Patient           Patient will benefit from skilled therapeutic intervention in order to improve the following deficits and impairments:  Decreased range of motion, Difficulty walking, Abnormal gait, Decreased activity tolerance, Pain, Postural dysfunction, Decreased strength  Visit Diagnosis: Chronic bilateral low back pain, unspecified whether sciatica present  Chronic right shoulder pain  Stiffness of right shoulder, not elsewhere classified  Muscle weakness (generalized)  Unsteadiness on feet  Other abnormalities of gait and mobility     Problem List Patient Active Problem List   Diagnosis Date Noted  . Fatigue 01/15/2019  . Chronic systolic (congestive) heart failure ([place]) 09/18/2018  . Ischemic cardiomyopathy 09/16/2018  . Aortic regurgitation 09/16/2018  . Cardiomyopathy, unspecified ([place]) 06/13/2018  . Abscess of right axilla 12/12/2017  . BMI 33.0-33.9,adult 12/12/2017  . 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  . Acute blood loss anemia 10/25/2017  .  Acute postoperative respiratory insufficiency 10/25/2017  . Postoperative delirium 10/25/2017  . Dyslipidemia 10/17/2017  . SOB (shortness of breath) 03/31/2017  . Long term current use of aspirin 03/29/2017  . Parkinson's disease ([place]) 01/02/2017  . Morbid (severe) obesity due to excess calories ([place]) 10/27/2015  . Memory change 07/06/2015  . Depression, recurrent ([place]) 07/06/2015  . Medial meniscus tear 10/11/2011  . Neuroma of foot 10/11/2011  . Coronary artery disease involving native coronary artery of native heart with angina pectoris ([place]) 12/28/2010  . OTHER TESTICULAR  HYPOFUNCTION 05/20/2010  . Mixed hyperlipidemia 05/20/2010  . Hypertensive heart disease with heart failure ([place]) 05/20/2010  . 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 GERD 05/20/2010  . TRANSIENT ISCHEMIC ATTACK, HX OF 05/20/2010  . NEPHROLITHIASIS, HX OF 05/20/2010  . BENIGN PROSTATIC HYPERTROPHY, HX OF, S/P TURP 05/20/2010    Hilda Blades, PT, DPT, LAT, ATC 03/04/20  8:32 AM Phone: [phone] Fax: [facility] [address] [place], [place], [address] Phone: [phone]   Fax:  [phone]  Name: Shawn Meza MRN: [id number] Date of Birth: [birth year]

## 2020-03-05 ENCOUNTER — Ambulatory Visit: Payer: Medicare Other | Admitting: Physical Therapy

## 2020-03-05 ENCOUNTER — Other Ambulatory Visit: Payer: Self-pay

## 2020-03-05 ENCOUNTER — Encounter: Payer: Self-pay | Admitting: Physical Therapy

## 2020-03-05 DIAGNOSIS — R2681 Unsteadiness on feet: Secondary | ICD-10-CM

## 2020-03-05 DIAGNOSIS — M545 Low back pain, unspecified: Secondary | ICD-10-CM

## 2020-03-05 DIAGNOSIS — G8929 Other chronic pain: Secondary | ICD-10-CM

## 2020-03-05 DIAGNOSIS — M25511 Pain in right shoulder: Secondary | ICD-10-CM

## 2020-03-05 DIAGNOSIS — M25611 Stiffness of right shoulder, not elsewhere classified: Secondary | ICD-10-CM

## 2020-03-05 DIAGNOSIS — R2689 Other abnormalities of gait and mobility: Secondary | ICD-10-CM

## 2020-03-05 DIAGNOSIS — M6281 Muscle weakness (generalized): Secondary | ICD-10-CM

## 2020-03-05 NOTE — Progress Notes (Signed)
Assessment/Plan:   1.  Parkinsons Disease  -continue carbidopa/levodopa 25/100 CR, 1 tablet 3 times per day  -currently in physical therapy.  2. Depression  -on Trintellix. Declines psychiatry.  3. B12 deficiency  -on supplement  4. Lumbar spinal stenosis  -following with Dr. Vertell Limber. Epidural steroid injection with benefit but it wore off.  Encouraged him to call and get a follow up for injection.  Subjective:   Shawn Meza was seen today in follow up for Parkinsons disease.  My previous records were reviewed prior to todays visit as well as outside records available to me. Discussed last visit moving the last dose from bedtime to before dinner. Patient states that he is still taking last one before bed.   pt denies falls.  Pt denies lightheadedness, near syncope.  No hallucinations.   Patient seen by Dr. Vertell Limber for severe lumbar stenosis since last visit. Patient seen on March 29. I have reviewed those records. Patient had lumbar epidural steroid injection. Records from Dr. Melven Sartorius office indicate that this was quite helpful.  He states that it really helped for about a month but he hasn't gone back for another.  Patient seen in the neuro rehab center for therapy screens and physical therapy was recommended, which he has started.  Was sick for about a month and unknown etiology.  He got up yesterday AM and was feeling better and actually went to church for the first time in 2-3 years and went to [place] this AM.  Pt states that they never tested him for covid.  He was very fatigued and would sleep all day long.  Current prescribed movement disorder medications: Carbidopa/levodopa 25/100 CR, 1 tablet 3 times per day  ALLERGIES:   Allergies  Allergen Reactions  . Testosterone Other (See Comments)    ABDOMINAL PAIN and cramping  . Fluoxetine Other (See Comments)    Caused depression and aggression  . Requip [Ropinirole] Nausea Only    CURRENT MEDICATIONS:  Outpatient Encounter  Medications as of 03/09/2020  Medication Sig  . aspirin 81 MG tablet Take 81 mg by mouth daily.   . Carbidopa-Levodopa ER (SINEMET CR) 25-100 MG tablet controlled release TAKE 1 TABLET BY MOUTH 3 TIMES DAILY.  Marland Kitchen clopidogrel (PLAVIX) 75 MG tablet TAKE 1 TABLET BY MOUTH ONCE DAILY.  Marland Kitchen ENTRESTO 24-26 MG TAKE 1 TABLET BY MOUTH 2 TIMES DAILY.  . ergocalciferol (VITAMIN D2) 1.25 MG (50000 UT) capsule Take 1 capsule (50,000 Units total) by mouth once a week.  . esomeprazole (NEXIUM) 40 MG capsule Take 1 capsule by mouth daily.  . fluocinonide-emollient (LIDEX-E) 0.05 % cream Apply 1 application topically 2 (two) times daily.  . furosemide (LASIX) 40 MG tablet Take 1 tablet (40 mg total) by mouth daily. PLEASE CALL OFFICE TO SCHEDULE FOLLOW UP APPOINTMENT  . gabapentin (NEURONTIN) 100 MG capsule Take 1 capsule (100 mg total) by mouth at bedtime.  Marland Kitchen levocetirizine (XYZAL) 5 MG tablet Take 1 tablet (5 mg total) by mouth every evening.  . metoprolol tartrate (LOPRESSOR) 25 MG tablet TAKE 1/2 TABLET BY MOUTH 2 TIMES DAILY.  . Multiple Vitamin (MULTI-VITAMINS) TABS Take 1 tablet by mouth daily.   . nitroGLYCERIN (NITROSTAT) 0.4 MG SL tablet Place 1 tablet (0.4 mg total) under the tongue every 5 (five) minutes as needed. Chest pain  . ondansetron (ZOFRAN-ODT) 4 MG disintegrating tablet Take 1 tablet (4 mg total) by mouth every 8 (eight) hours as needed for nausea or vomiting.  . pravastatin (  PRAVACHOL) 20 MG tablet TAKE 1 TABLET BY MOUTH EVERY EVENING.  . tamsulosin (FLOMAX) 0.4 MG CAPS capsule Take 0.4 mg by mouth daily as needed (to relax the prostate).   . vortioxetine HBr (TRINTELLIX) 5 MG TABS tablet Take 2 tablets (10 mg total) by mouth daily.  . [DISCONTINUED] clopidogrel (PLAVIX) 75 MG tablet TAKE 1 TABLET BY MOUTH ONCE DAILY. PLEASE CALL OFFICE TO SCHEDULE FOLLOW UP APPOINTMENT  . [DISCONTINUED] ergocalciferol (VITAMIN D2) 1.25 MG (50000 UT) capsule Take 1 capsule (50,000 Units total) by mouth once a  week.  . [DISCONTINUED] metoprolol tartrate (LOPRESSOR) 25 MG tablet TAKE 1/2 TABLET BY MOUTH 2 TIMES DAILY.  . [DISCONTINUED] TRINTELLIX 5 MG TABS tablet Take 1 tablet (5 mg total) by mouth daily.   No facility-administered encounter medications on file as of 03/09/2020.    Objective:   PHYSICAL EXAMINATION:    VITALS:   Vitals:   03/09/20 1356  BP: 129/67  Pulse: 79  SpO2: 96%  Weight: 214 lb (97.1 kg)  Height: 5\' 6"  (1.676 m)    GEN:  The patient appears stated age and is in NAD. HEENT:  Normocephalic, atraumatic.  The mucous membranes are moist. The superficial temporal arteries are without ropiness or tenderness. CV:  RRR Lungs:  CTAB Neck/HEME:  There are no carotid bruits bilaterally.  Neurological examination:  Orientation: The patient is alert and oriented x3. Cranial nerves: There is good facial symmetry with facial hypomimia. The speech is fluent and mildy dysarthric. Soft palate rises symmetrically and there is no tongue deviation. Hearing is intact to conversational tone. Sensation: Sensation is intact to light touch throughout Motor: Strength is at least antigravity x4.  Movement examination: Tone: There is normal tone in the UE/LE Abnormal movements: there is LUE rest tremor Coordination:  There is  decremation with RAM's, with finger taps on the L Gait and Station: The patient has no difficulty arising out of a deep-seated chair without the use of the hands. The patient's stride length is good but he slightly drags the L leg.    I have reviewed and interpreted the following labs independently    Chemistry      Component Value Date/Time   NA 140 02/13/2020 0335   NA 141 05/28/2019 1355   K 4.4 02/13/2020 0335   CL 106 02/13/2020 0335   CO2 22 02/13/2020 0335   BUN 21 02/13/2020 0335   BUN 15 05/28/2019 1355   CREATININE 1.16 02/13/2020 0335   CREATININE 1.03 05/20/2015 1410      Component Value Date/Time   CALCIUM 9.1 02/13/2020 0335   ALKPHOS 61  02/13/2020 0335   AST 32 02/13/2020 0335   ALT 31 02/13/2020 0335   BILITOT 1.1 02/13/2020 0335   BILITOT 0.4 02/20/2019 1342       Lab Results  Component Value Date   WBC 13.5 (H) 02/13/2020   HGB 15.9 02/13/2020   HCT 48.1 02/13/2020   MCV 96.0 02/13/2020   PLT 245 02/13/2020    Lab Results  Component Value Date   TSH 1.73 01/22/2020     Total time spent on today's visit was 30 minutes, including both face-to-face time and nonface-to-face time.  Time included that spent on review of records (prior notes available to me/labs/imaging if pertinent), discussing treatment and goals, answering patient's questions and coordinating care.  Cc:  [name], DO

## 2020-03-05 NOTE — Therapy (Signed)
[place], [place], [address] Phone: [phone]   Fax:  [phone]  Physical Therapy Treatment  Patient Details  Name: Shawn Meza MRN: [id number] Date of Birth: Jul 10, 1946 Referring Provider (PT): [name], [place]   Encounter Date: 03/05/2020   PT End of Session - 03/05/20 1520    Visit Number 5    Number of Visits 16    Date for PT Re-Evaluation 04/06/20    Authorization Type UHC MCR    Authorization Time Period KX after 15th visit    Progress Note Due on Visit 10    PT Start Time 1530    PT Stop Time 1615    PT Time Calculation (min) 45 min    Activity Tolerance Patient tolerated treatment well    Behavior During Therapy [facility] for tasks assessed/performed           Past Medical History:  Diagnosis Date  . Arthritis   . BPH (benign prostatic hypertrophy)   . Chronic coronary artery disease   . Colon cancer ([place])   . Elevated PSA   . Erectile dysfunction   . Essential hypertension   . Essential tremor   . GERD (gastroesophageal reflux disease)   . Headache(784.0)   . Hiatal hernia   . Hypercholesterolemia   . Long-term use of aspirin therapy   . Major depression, chronic   . Medial meniscus tear 10/11/2011  . Metabolic syndrome   . Morbid obesity ([place])   . Nephrolithiasis    hx of  . NSTEMI (non-ST elevated myocardial infarction) ([place])   . Parkinson's disease ([place])   . S/P CABG (coronary artery bypass graft)   . Transient ischemic attack    hx of  . Trochanteric bursitis of right hip     Past Surgical History:  Procedure Laterality Date  . CARDIAC CATHETERIZATION  5/12,1/13   4 stents placed  . COLON SURGERY    . CORONARY ARTERY BYPASS GRAFT    . KNEE ARTHROSCOPY  10/11/2011   Procedure: ARTHROSCOPY KNEE;  Surgeon: Lorn Junes, MD;  Location: [facility];  Service: Orthopedics;  Laterality: Left;  Left Knee Arthroscopy with Medial and Lateral  Partial Menisectomy, Chondroplasty  . LEFT HEART CATHETERIZATION WITH CORONARY ANGIOGRAM N/A 08/25/2011   Procedure: LEFT HEART CATHETERIZATION WITH CORONARY ANGIOGRAM;  Surgeon: Burnell Blanks, MD;  Location: [facility] CATH LAB;  Service: Cardiovascular;  Laterality: N/A;  . LITHOTRIPSY    . STERIOD INJECTION  10/11/2011   Procedure: STEROID INJECTION;  Surgeon: Lorn Junes, MD;  Location: [facility];  Service: Orthopedics;  Laterality: Right;  Steroid Injection Second Toe  . TRANSURETHRAL RESECTION OF PROSTATE    . URETHRAL DILATION      There were no vitals filed for this visit.   Subjective Assessment - 03/05/20 1526    Subjective Patient reports he is still stiff and feels fatigued. States shoulder is doing better, his biggest issues is the muscle on the left lower/mid back and across that region.    Patient Stated Goals Get back and shoulder better so he can be more active    Currently in Pain? Yes    Pain Score 4     Pain Location Back    Pain Orientation Lower;Mid;Left    Pain Descriptors / Indicators Sore;Tightness    Pain Type Chronic pain    Pain Onset More than a month ago    Pain Frequency Constant  Aggravating Factors  Standing, walking, lifting, bending    Pain Score 0    Pain Location Shoulder    Pain Orientation Right    Pain Type Chronic pain    Pain Onset More than a month ago    Pain Frequency Intermittent              [facility] PT Assessment - 03/05/20 0001      AROM   Right Shoulder Flexion 120 Degrees    Right Shoulder ABduction 90 Degrees                         [facility] Adult PT Treatment/Exercise - 03/05/20 0001      Exercises   Exercises Shoulder;Lumbar;Knee/Hip      Lumbar Exercises: Stretches   Passive Hamstring Stretch 2 reps;30 seconds    Passive Hamstring Stretch Limitations supine    Single Knee to Chest Stretch 2 reps;30 seconds    Single Knee to Chest Stretch Limitations supine    Lower Trunk Rotation  5 reps;10 seconds    Hip Flexor Stretch 3 reps;30 seconds    Hip Flexor Stretch Limitations supine edge of table    Piriformis Stretch 2 reps;30 seconds    Piriformis Stretch Limitations supine      Lumbar Exercises: Aerobic   Nustep L5 x 8 min (LE only)      Lumbar Exercises: Supine   Clam 10 reps    Clam Limitations red band    Bridge 10 reps;3 seconds    Bridge Limitations partial range    Straight Leg Raise 10 reps      Shoulder Exercises: Supine   Flexion 10 reps;AAROM   2 sets   Flexion Limitations dowel      Shoulder Exercises: Seated   Row 10 reps   2 sets   Theraband Level (Shoulder Row) Level 2 (Red)      Manual Therapy   Manual Therapy Joint mobilization;Passive ROM;Soft tissue mobilization    Joint Mobilization Right shoulder and scapular mobs    Soft tissue mobilization IASTM to left lumbar and thoracic paraspinals    Passive ROM Right shoulder all directions                  PT Education - 03/05/20 1520    Education Details HEP    Person(s) Educated Patient    Methods Explanation;Demonstration;Verbal cues    Comprehension Verbalized understanding;Returned demonstration;Verbal cues required;Need further instruction            PT Short Term Goals - 02/10/20 1631      PT SHORT TERM GOAL #1   Title Patient will be I with initial HEP to progress with PT    Time 4    Period Weeks    Status New    Target Date 03/09/20      PT SHORT TERM GOAL #2   Title Patient will report improved standing ability >/= 20 minutes to improve ability to perform tasks around house and in yard    Time 4    Period Weeks    Status New    Target Date 03/09/20      PT SHORT TERM GOAL #3   Title Patient will be able to place a light object (</= 3 lbs) into shelf at shoulder height with only min difficulty    Time 4    Period Weeks    Status New    Target Date 04/06/20  PT SHORT TERM GOAL #4   Title Patient will report no difficulty getting in/out of a chair  to improve transfer ability    Time 4    Period Weeks    Status New    Target Date 03/09/20             PT Long Term Goals - 02/10/20 1641      PT LONG TERM GOAL #1   Title Patient will be I with final HEP to maintain progress from PT    Time 8    Period Weeks    Status New    Target Date 04/06/20      PT LONG TERM GOAL #2   Title Patient will report improved functional level of shoulder to </= 38% limitation, lumbar to </= 52% limitation    Time 8    Period Weeks    Status New    Target Date 04/06/20      PT LONG TERM GOAL #3   Title Patient will exhibit lumbar improvement of >/= 50% in all directions to improve ability to dress and perform self care without limitation    Time 8    Period Weeks    Status New    Target Date 04/06/20      PT LONG TERM GOAL #4   Title Patient will report no limitation with performing activities around home or with standing tasks    Time 8    Period Weeks    Status New    Target Date 04/06/20      PT LONG TERM GOAL #5   Title Patient will exhibit improved right shoulder strength grossly >/= 4+/5 MMT to be able to lift gallon of milk back into fridge without assist from other arm    Time 8    Period Weeks    Status New    Target Date 04/06/20                 Plan - 03/05/20 1520    Clinical Impression Statement Patient tolerated therapy well with no adverse effects. Patient continues to report stiffness that was mainly left paraspinal region so used IASTM and stretching with good benefit, patient reported improvement following therapy. He does exhibit improved shoulder motion this visit, continues to report weakness with lifting overhead. He would benefit from continued skilled PT to improve his ability to stand and walk longer periods of time to be able to perform household tasks and activity without limitation    PT Treatment/Interventions ADLs/Self Care Home Management;Cryotherapy;Electrical Stimulation;Moist  Heat;Iontophoresis 4mg /ml Dexamethasone;Traction;Ultrasound;Neuromuscular re-education;Balance training;Therapeutic exercise;Therapeutic activities;Functional mobility training;Stair training;Gait training;Patient/family education;Manual techniques;Dry needling;Passive range of motion;Taping;Spinal Manipulations;Joint Manipulations    PT Next Visit Plan Assess HEP and progress PRN, manual/stretching for hip and lumbar mobility, progress core/hip and postural control, light rotator cuff strengthening as able    PT Home Exercise Plan [email or web address]: supine single knee to chest stretch, supine piriformis stretch, LTR, supine hip flexor stretch edge of bed, supine clamshell with red, SLR, seated hamstring stretch, seated row with red, supine shoulder flexion with dowel    Consulted and Agree with Plan of Care Patient           Patient will benefit from skilled therapeutic intervention in order to improve the following deficits and impairments:  Decreased range of motion, Difficulty walking, Abnormal gait, Decreased activity tolerance, Pain, Postural dysfunction, Decreased strength  Visit Diagnosis: Chronic bilateral low back pain, unspecified whether sciatica present  Chronic right shoulder pain  Stiffness of right shoulder, not elsewhere classified  Muscle weakness (generalized)  Unsteadiness on feet  Other abnormalities of gait and mobility     Problem List Patient Active Problem List   Diagnosis Date Noted  . Fatigue 01/15/2019  . Chronic systolic (congestive) heart failure ([place]) 09/18/2018  . Ischemic cardiomyopathy 09/16/2018  . Aortic regurgitation 09/16/2018  . Cardiomyopathy, unspecified ([place]) 06/13/2018  . Abscess of right axilla 12/12/2017  . BMI 33.0-33.9,adult 12/12/2017  . 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  . Acute blood loss anemia 10/25/2017  . Acute postoperative respiratory insufficiency 10/25/2017  . Postoperative delirium 10/25/2017  . Dyslipidemia  10/17/2017  . SOB (shortness of breath) 03/31/2017  . Long term current use of aspirin 03/29/2017  . Parkinson's disease ([place]) 01/02/2017  . Morbid (severe) obesity due to excess calories ([place]) 10/27/2015  . Memory change 07/06/2015  . Depression, recurrent ([place]) 07/06/2015  . Medial meniscus tear 10/11/2011  . Neuroma of foot 10/11/2011  . Coronary artery disease involving native coronary artery of native heart with angina pectoris ([place]) 12/28/2010  . 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 Mixed hyperlipidemia 05/20/2010  . Hypertensive heart disease with heart failure ([place]) 05/20/2010  . 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 GERD 05/20/2010  . TRANSIENT ISCHEMIC ATTACK, HX OF 05/20/2010  . NEPHROLITHIASIS, HX OF 05/20/2010  . BENIGN PROSTATIC HYPERTROPHY, HX OF, S/P TURP 05/20/2010    Hilda Blades, PT, DPT, LAT, ATC 03/05/20  4:30 PM Phone: [phone] Fax: [facility] [address] [place], [place], [address] Phone: [phone]   Fax:  [phone]  Name: Shawn Meza MRN: [id number] Date of Birth: 1946/03/16

## 2020-03-05 NOTE — Patient Instructions (Signed)
Access Code: [email or web address] URL: [email or web address] Date: 03/05/2020 Prepared by: Hilda Blades  Exercises Hooklying Single Knee to Chest Stretch - 2 x daily - 7 x weekly - 2 sets - 10 reps Supine Piriformis Stretch with Foot on Ground - 2 x daily - 7 x weekly - 2 sets - 10 reps Supine Lower Trunk Rotation - 2 x daily - 7 x weekly - 2 sets - 10 reps Hooklying Clamshell with Resistance - 2 x daily - 7 x weekly - 2 sets - 10 reps Supine Active Straight Leg Raise - 2 x daily - 7 x weekly - 2 sets - 10 reps Seated Hamstring Stretch - 2 x daily - 7 x weekly - 3 sets - 10 reps Seated Shoulder Row with Anchored Resistance - 2 x daily - 7 x weekly - 2 sets - 10 reps Supine Shoulder Flexion with Dowel - 2 x daily - 7 x weekly - 10 reps - 5-10 seconds hold

## 2020-03-09 ENCOUNTER — Other Ambulatory Visit: Payer: Self-pay

## 2020-03-09 ENCOUNTER — Ambulatory Visit (INDEPENDENT_AMBULATORY_CARE_PROVIDER_SITE_OTHER): Payer: Medicare Other | Admitting: Neurology

## 2020-03-09 ENCOUNTER — Encounter: Payer: Self-pay | Admitting: Neurology

## 2020-03-09 VITALS — BP 129/67 | HR 79 | Ht 66.0 in | Wt 214.0 lb

## 2020-03-09 DIAGNOSIS — G20 Parkinson's disease: Secondary | ICD-10-CM

## 2020-03-09 DIAGNOSIS — F330 Major depressive disorder, recurrent, mild: Secondary | ICD-10-CM

## 2020-03-09 NOTE — Patient Instructions (Addendum)
Take your carbidopa/levodopa 25/100 cr, 1 at 8am, noon, 4pm (don't take last one at bedtime)  If your back starts hurting more, get an appointment for another injection (epidural steroid).  The physicians and staff at [facility] are committed to providing excellent care. You may receive a survey requesting feedback about your experience at our office. We strive to receive "very good" responses to the survey questions. If you feel that your experience would prevent you from giving the office a "very good " response, please contact our office to try to remedy the situation. We may be reached at [phone]. Thank you for taking the time out of your busy day to complete the survey.

## 2020-03-10 ENCOUNTER — Ambulatory Visit: Payer: Medicare Other | Admitting: Physical Therapy

## 2020-03-10 ENCOUNTER — Other Ambulatory Visit: Payer: Self-pay

## 2020-03-10 ENCOUNTER — Encounter: Payer: Self-pay | Admitting: Physical Therapy

## 2020-03-10 DIAGNOSIS — M545 Low back pain, unspecified: Secondary | ICD-10-CM

## 2020-03-10 DIAGNOSIS — M25511 Pain in right shoulder: Secondary | ICD-10-CM

## 2020-03-10 DIAGNOSIS — M25611 Stiffness of right shoulder, not elsewhere classified: Secondary | ICD-10-CM

## 2020-03-10 DIAGNOSIS — G8929 Other chronic pain: Secondary | ICD-10-CM

## 2020-03-10 DIAGNOSIS — M6281 Muscle weakness (generalized): Secondary | ICD-10-CM

## 2020-03-10 NOTE — Therapy (Signed)
[place], [place], [address] Phone: [phone]   Fax:  [phone]  Physical Therapy Treatment  Patient Details  Name: Shawn Meza MRN: [id number] Date of Birth: [birth year] Referring Provider (PT): [name], [place]   Encounter Date: 03/10/2020   PT End of Session - 03/10/20 1535    Visit Number 6    Number of Visits 16    Date for PT Re-Evaluation 04/06/20    Authorization Type UHC MCR    Authorization Time Period KX after 15th visit    Progress Note Due on Visit 10    PT Start Time 1530    PT Stop Time 1615    PT Time Calculation (min) 45 min    Activity Tolerance Patient tolerated treatment well    Behavior During Therapy [facility] for tasks assessed/performed           Past Medical History:  Diagnosis Date  . Arthritis   . BPH (benign prostatic hypertrophy)   . Chronic coronary artery disease   . Colon cancer ([place])   . Elevated PSA   . Erectile dysfunction   . Essential hypertension   . Essential tremor   . GERD (gastroesophageal reflux disease)   . Headache(784.0)   . Hiatal hernia   . Hypercholesterolemia   . Long-term use of aspirin therapy   . Major depression, chronic   . Medial meniscus tear 10/11/2011  . Metabolic syndrome   . Morbid obesity ([place])   . Nephrolithiasis    hx of  . NSTEMI (non-ST elevated myocardial infarction) ([place])   . Parkinson's disease ([place])   . S/P CABG (coronary artery bypass graft)   . Transient ischemic attack    hx of  . Trochanteric bursitis of right hip     Past Surgical History:  Procedure Laterality Date  . CARDIAC CATHETERIZATION  5/12,1/13   4 stents placed  . COLON SURGERY    . CORONARY ARTERY BYPASS GRAFT    . KNEE ARTHROSCOPY  10/11/2011   Procedure: ARTHROSCOPY KNEE;  Surgeon: Lorn Junes, MD;  Location: [place];  Service: Orthopedics;  Laterality: Left;  Left Knee Arthroscopy with Medial and Lateral  Partial Menisectomy, Chondroplasty  . LEFT HEART CATHETERIZATION WITH CORONARY ANGIOGRAM N/A 08/25/2011   Procedure: LEFT HEART CATHETERIZATION WITH CORONARY ANGIOGRAM;  Surgeon: Burnell Blanks, MD;  Location: [facility] CATH LAB;  Service: Cardiovascular;  Laterality: N/A;  . LITHOTRIPSY    . STERIOD INJECTION  10/11/2011   Procedure: STEROID INJECTION;  Surgeon: Lorn Junes, MD;  Location: [place];  Service: Orthopedics;  Laterality: Right;  Steroid Injection Second Toe  . TRANSURETHRAL RESECTION OF PROSTATE    . URETHRAL DILATION      There were no vitals filed for this visit.   Subjective Assessment - 03/10/20 1529    Subjective Patient reports he is feeling better. He feels like he did have a little bit more energy the past few days but was feeling rough this morning.    Patient Stated Goals Get back and shoulder better so he can be more active    Currently in Pain? Yes    Pain Score 4     Pain Location Back    Pain Orientation Lower    Pain Descriptors / Indicators Tightness;Sore    Pain Type Chronic pain    Pain Onset More than a month ago    Pain Frequency Constant  [facility] PT Assessment - 03/10/20 0001      Assessment   Medical Diagnosis Chronic right shoulder pain, Chronic bilateral low back pain    Referring Provider (PT) [name], Crosby Oyster, DO      Precautions   Precautions None      Restrictions   Weight Bearing Restrictions No      Balance Screen   Has the patient fallen in the past 6 months No      Prior Function   Level of Independence Independent      Observation/Other Assessments   Focus on Therapeutic Outcomes (FOTO)  Shoulder: 59% limitation, Lumbar: 53% limitation                         [facility] Adult PT Treatment/Exercise - 03/10/20 0001      Exercises   Exercises Shoulder;Lumbar;Knee/Hip      Lumbar Exercises: Stretches   Passive Hamstring Stretch 2 reps;30 seconds    Passive Hamstring  Stretch Limitations supine    Single Knee to Chest Stretch 2 reps;30 seconds    Single Knee to Chest Stretch Limitations supine    Lower Trunk Rotation 5 reps;10 seconds    Hip Flexor Stretch 2 reps;30 seconds    Hip Flexor Stretch Limitations supine edge of table    Piriformis Stretch 2 reps;30 seconds    Piriformis Stretch Limitations supine      Lumbar Exercises: Aerobic   Nustep L5 x 6 min (LE only)      Lumbar Exercises: Seated   Sit to Stand 10 reps   2 sets     Lumbar Exercises: Supine   Clam 10 reps   2 sets   Clam Limitations red band    Bent Knee Raise 20 reps   2 sets   Bridge 10 reps;1 second   2 sets   Bridge Limitations partial range    Straight Leg Raise 10 reps   2 sets     Shoulder Exercises: Supine   Flexion 10 reps;AAROM   2 sets   Flexion Limitations dowel      Shoulder Exercises: Seated   Row 10 reps   2 sets   Theraband Level (Shoulder Row) Level 2 (Red)      Manual Therapy   Manual Therapy Soft tissue mobilization    Soft tissue mobilization IASTM to left lumbar and thoracic paraspinals                  PT Education - 03/10/20 1534    Education Details HEP    Person(s) Educated Patient    Methods Explanation;Demonstration;Verbal cues    Comprehension Verbalized understanding;Returned demonstration;Verbal cues required;Need further instruction            PT Short Term Goals - 03/10/20 1537      PT SHORT TERM GOAL #1   Title Patient will be I with initial HEP to progress with PT    Baseline Patient continues to report inconsistency with HEP    Time 4    Period Weeks    Status On-going    Target Date 03/09/20      PT SHORT TERM GOAL #2   Title Patient will report improved standing ability >/= 20 minutes to improve ability to perform tasks around house and in yard    Baseline Patient reports continued limitation with standing, some days he can stand 20-30 minutes, other days 5-10 minutes    Time 4  Period Weeks    Status  On-going    Target Date 03/09/20      PT SHORT TERM GOAL #3   Title Patient will be able to place a light object (</= 3 lbs) into shelf at shoulder height with only min difficulty    Time 4    Period Weeks    Status On-going    Target Date 04/06/20      PT SHORT TERM GOAL #4   Title Patient will report no difficulty getting in/out of a chair to improve transfer ability    Baseline Patient able to get in/out of standard chair without difficulty or pain    Time 4    Period Weeks    Status Achieved    Target Date 03/09/20             PT Long Term Goals - 02/10/20 1641      PT LONG TERM GOAL #1   Title Patient will be I with final HEP to maintain progress from PT    Time 8    Period Weeks    Status New    Target Date 04/06/20      PT LONG TERM GOAL #2   Title Patient will report improved functional level of shoulder to </= 38% limitation, lumbar to </= 52% limitation    Time 8    Period Weeks    Status New    Target Date 04/06/20      PT LONG TERM GOAL #3   Title Patient will exhibit lumbar improvement of >/= 50% in all directions to improve ability to dress and perform self care without limitation    Time 8    Period Weeks    Status New    Target Date 04/06/20      PT LONG TERM GOAL #4   Title Patient will report no limitation with performing activities around home or with standing tasks    Time 8    Period Weeks    Status New    Target Date 04/06/20      PT LONG TERM GOAL #5   Title Patient will exhibit improved right shoulder strength grossly >/= 4+/5 MMT to be able to lift gallon of milk back into fridge without assist from other arm    Time 8    Period Weeks    Status New    Target Date 04/06/20                 Plan - 03/10/20 1536    Clinical Impression Statement Patient tolerated therapy well with no adverse effects. He reports improvement in his function and is progressing well with his strengthening exercises. Based on FOTO reassessment  patient is improving in regard to lumbar but does report worsening in regard to shoulder. Patient reports continued difficulty with shoulder so will focus more on shoulder in next visit. He would benefit from continued skilled PT to improve his ability to stand and walk longer periods of time to be able to perform household tasks and activity without limitation    PT Treatment/Interventions ADLs/Self Care Home Management;Cryotherapy;Electrical Stimulation;Moist Heat;Iontophoresis 4mg /ml Dexamethasone;Traction;Ultrasound;Neuromuscular re-education;Balance training;Therapeutic exercise;Therapeutic activities;Functional mobility training;Stair training;Gait training;Patient/family education;Manual techniques;Dry needling;Passive range of motion;Taping;Spinal Manipulations;Joint Manipulations    PT Next Visit Plan Assess HEP and progress PRN, manual/stretching for hip and lumbar mobility, progress core/hip and postural control, light rotator cuff strengthening as able    PT Home Exercise Plan [email or web address]: supine single knee to chest stretch, supine  piriformis stretch, LTR, supine hip flexor stretch edge of bed, supine clamshell with red, bridge, SLR, seated hamstring stretch, seated row with red, supine shoulder flexion with dowel    Consulted and Agree with Plan of Care Patient           Patient will benefit from skilled therapeutic intervention in order to improve the following deficits and impairments:  Decreased range of motion, Difficulty walking, Abnormal gait, Decreased activity tolerance, Pain, Postural dysfunction, Decreased strength  Visit Diagnosis: Chronic bilateral low back pain, unspecified whether sciatica present  Chronic right shoulder pain  Stiffness of right shoulder, not elsewhere classified  Muscle weakness (generalized)     Problem List Patient Active Problem List   Diagnosis Date Noted  . Fatigue 01/15/2019  . Chronic systolic (congestive) heart failure ([place]) 09/18/2018    . Ischemic cardiomyopathy 09/16/2018  . Aortic regurgitation 09/16/2018  . Cardiomyopathy, unspecified ([place]) 06/13/2018  . Abscess of right axilla 12/12/2017  . BMI 33.0-33.9,adult 12/12/2017  . 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  . Acute blood loss anemia 10/25/2017  . Acute postoperative respiratory insufficiency 10/25/2017  . Postoperative delirium 10/25/2017  . Dyslipidemia 10/17/2017  . SOB (shortness of breath) 03/31/2017  . Long term current use of aspirin 03/29/2017  . Parkinson's disease ([place]) 01/02/2017  . Morbid (severe) obesity due to excess calories ([place]) 10/27/2015  . Memory change 07/06/2015  . Depression, recurrent ([place]) 07/06/2015  . Medial meniscus tear 10/11/2011  . Neuroma of foot 10/11/2011  . Coronary artery disease involving native coronary artery of native heart with angina pectoris ([place]) 12/28/2010  . 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 Mixed hyperlipidemia 05/20/2010  . Hypertensive heart disease with heart failure ([place]) 05/20/2010  . 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 GERD 05/20/2010  . TRANSIENT ISCHEMIC ATTACK, HX OF 05/20/2010  . NEPHROLITHIASIS, HX OF 05/20/2010  . BENIGN PROSTATIC HYPERTROPHY, HX OF, S/P TURP 05/20/2010    Hilda Blades, PT, DPT, LAT, ATC 03/10/20  4:50 PM Phone: [phone] Fax: [facility] [address] [place], [place], [address] Phone: [phone]   Fax:  [phone]  Name: RICHARD RITCHEY MRN: [id number] Date of Birth: 1946-01-27

## 2020-03-10 NOTE — Patient Instructions (Signed)
Access Code: [email or web address] URL: [email or web address] Date: 03/05/2020 Prepared by: Hilda Blades  Exercises Hooklying Single Knee to Chest Stretch - 1 x daily - 7 x weekly - 2 reps - 30 seconds hold Supine Piriformis Stretch with Foot on Ground - 1 x daily - 7 x weekly - 2 reps - 30 seconds hold Supine Lower Trunk Rotation - 1 x daily - 7 x weekly - 5 reps - 10 seconds hold Hooklying Clamshell with Resistance - 1 x daily - 7 x weekly - 2 sets - 10 reps Supine Active Straight Leg Raise - 1 x daily - 7 x weekly - 2 sets - 10 reps Supine Bridge - 1 x daily - 7 x weekly - 2 sets - 10 reps Seated Hamstring Stretch - 2 x daily - 7 x weekly - 2 reps - 30 seconds hold Seated Shoulder Row with Anchored Resistance - 1 x daily - 7 x weekly - 2 sets - 10 reps Supine Shoulder Flexion with Dowel - 1 x daily - 7 x weekly - 10 reps - 5-10 seconds hold

## 2020-03-11 DIAGNOSIS — H1789 Other corneal scars and opacities: Secondary | ICD-10-CM | POA: Diagnosis not present

## 2020-03-11 DIAGNOSIS — H524 Presbyopia: Secondary | ICD-10-CM | POA: Diagnosis not present

## 2020-03-11 DIAGNOSIS — H16223 Keratoconjunctivitis sicca, not specified as Sjogren's, bilateral: Secondary | ICD-10-CM | POA: Diagnosis not present

## 2020-03-11 DIAGNOSIS — H5202 Hypermetropia, left eye: Secondary | ICD-10-CM | POA: Diagnosis not present

## 2020-03-12 ENCOUNTER — Other Ambulatory Visit: Payer: Self-pay

## 2020-03-12 ENCOUNTER — Encounter: Payer: Self-pay | Admitting: Physical Therapy

## 2020-03-12 ENCOUNTER — Ambulatory Visit: Payer: Medicare Other | Admitting: Physical Therapy

## 2020-03-12 DIAGNOSIS — G8929 Other chronic pain: Secondary | ICD-10-CM

## 2020-03-12 DIAGNOSIS — M25511 Pain in right shoulder: Secondary | ICD-10-CM

## 2020-03-12 DIAGNOSIS — M25611 Stiffness of right shoulder, not elsewhere classified: Secondary | ICD-10-CM

## 2020-03-12 DIAGNOSIS — M545 Low back pain, unspecified: Secondary | ICD-10-CM

## 2020-03-12 DIAGNOSIS — M6281 Muscle weakness (generalized): Secondary | ICD-10-CM

## 2020-03-12 NOTE — Patient Instructions (Signed)
Access Code: [email or web address] URL: [email or web address] Date: 03/12/2020 Prepared by: Hilda Blades  Exercises Hooklying Single Knee to Chest Stretch - 2 x daily - 2 reps - 30 seconds hold Supine Piriformis Stretch with Foot on Ground - 2 x daily - 2 reps - 30 seconds hold Supine Lower Trunk Rotation - 2 x daily - 2 reps - 30 seconds hold Hooklying Clamshell with Resistance - 1 x daily - 2 sets - 10 reps Supine Active Straight Leg Raise - 1 x daily - 2 sets - 10 reps Supine Bridge - 1 x daily - 2 sets - 10 reps Seated Hamstring Stretch - 2 x daily - 2 reps - 30 seconds hold Supine Shoulder Flexion with Dowel - 2 x daily - 10 reps - 5-10 seconds hold Shoulder Flexion Wall Slide with Towel - 2 x daily - 10 reps - 5-10 seconds hold Seated Shoulder Row with Anchored Resistance - 1 x daily - 1 sets - 20 reps Shoulder Extension with Band - 1 x daily - 1 sets - 20 reps Shoulder External Rotation with Anchored Resistance - 1 x daily - 1 sets - 20 reps Shoulder Internal Rotation with Resistance - 1 x daily - 1 sets - 20 reps

## 2020-03-12 NOTE — Therapy (Signed)
[place], [place], [address] Phone: [phone]   Fax:  [phone]  Physical Therapy Treatment  Patient Details  Name: Shawn Meza MRN: [id number] Date of Birth: August 03, 1945 Referring Provider (PT): [name], [place]   Encounter Date: 03/12/2020   PT End of Session - 03/12/20 1521    Visit Number 7    Number of Visits 16    Date for PT Re-Evaluation 04/06/20    Authorization Type UHC MCR    Authorization Time Period KX after 15th visit    Progress Note Due on Visit 10    PT Start Time 1531    PT Stop Time 1612    PT Time Calculation (min) 41 min    Activity Tolerance Patient tolerated treatment well    Behavior During Therapy [facility] for tasks assessed/performed           Past Medical History:  Diagnosis Date  . Arthritis   . BPH (benign prostatic hypertrophy)   . Chronic coronary artery disease   . Colon cancer ([place])   . Elevated PSA   . Erectile dysfunction   . Essential hypertension   . Essential tremor   . GERD (gastroesophageal reflux disease)   . Headache(784.0)   . Hiatal hernia   . Hypercholesterolemia   . Long-term use of aspirin therapy   . Major depression, chronic   . Medial meniscus tear 10/11/2011  . Metabolic syndrome   . Morbid obesity ([place])   . Nephrolithiasis    hx of  . NSTEMI (non-ST elevated myocardial infarction) ([place])   . Parkinson's disease ([place])   . S/P CABG (coronary artery bypass graft)   . Transient ischemic attack    hx of  . Trochanteric bursitis of right hip     Past Surgical History:  Procedure Laterality Date  . CARDIAC CATHETERIZATION  5/12,1/13   4 stents placed  . COLON SURGERY    . CORONARY ARTERY BYPASS GRAFT    . KNEE ARTHROSCOPY  10/11/2011   Procedure: ARTHROSCOPY KNEE;  Surgeon: Lorn Junes, MD;  Location: [place];  Service: Orthopedics;  Laterality: Left;  Left Knee Arthroscopy with Medial and Lateral  Partial Menisectomy, Chondroplasty  . LEFT HEART CATHETERIZATION WITH CORONARY ANGIOGRAM N/A 08/25/2011   Procedure: LEFT HEART CATHETERIZATION WITH CORONARY ANGIOGRAM;  Surgeon: Burnell Blanks, MD;  Location: [facility] CATH LAB;  Service: Cardiovascular;  Laterality: N/A;  . LITHOTRIPSY    . STERIOD INJECTION  10/11/2011   Procedure: STEROID INJECTION;  Surgeon: Lorn Junes, MD;  Location: [place];  Service: Orthopedics;  Laterality: Right;  Steroid Injection Second Toe  . TRANSURETHRAL RESECTION OF PROSTATE    . URETHRAL DILATION      There were no vitals filed for this visit.                      [facility] Adult PT Treatment/Exercise - 03/12/20 0001      Exercises   Exercises Shoulder;Lumbar;Knee/Hip      Lumbar Exercises: Stretches   Passive Hamstring Stretch 2 reps;30 seconds    Passive Hamstring Stretch Limitations supine    Single Knee to Chest Stretch 2 reps;30 seconds    Single Knee to Chest Stretch Limitations supine    Lower Trunk Rotation 3 reps;10 seconds    Piriformis Stretch 2 reps;30 seconds    Piriformis Stretch Limitations supine      Lumbar Exercises: Aerobic  Nustep L5 x 6 min (UE and LE)      Lumbar Exercises: Supine   Bent Knee Raise 20 reps    Bridge 10 reps      Shoulder Exercises: Supine   Flexion 10 reps;AAROM   2 sets   Flexion Limitations dowel      Shoulder Exercises: Standing   External Rotation 15 reps    Theraband Level (Shoulder External Rotation) Level 1 (Yellow)    Internal Rotation 15 reps    Theraband Level (Shoulder Internal Rotation) Level 1 (Yellow)    Flexion AAROM;10 reps    Flexion Limitations wall slide    Extension 20 reps    Theraband Level (Shoulder Extension) Level 2 (Red)    Row 20 reps    Theraband Level (Shoulder Row) Level 2 (Red)      Manual Therapy   Manual Therapy Joint mobilization;Passive ROM    Joint Mobilization Right shoulder and scapular mobs    Passive ROM Right  shoulder all directions                  PT Education - 03/12/20 1520    Education Details HEP    Person(s) Educated Patient    Methods Explanation;Demonstration;Verbal cues    Comprehension Verbalized understanding;Returned demonstration;Verbal cues required;Need further instruction            PT Short Term Goals - 03/10/20 1537      PT SHORT TERM GOAL #1   Title Patient will be I with initial HEP to progress with PT    Baseline Patient continues to report inconsistency with HEP    Time 4    Period Weeks    Status On-going    Target Date 03/09/20      PT SHORT TERM GOAL #2   Title Patient will report improved standing ability >/= 20 minutes to improve ability to perform tasks around house and in yard    Baseline Patient reports continued limitation with standing, some days he can stand 20-30 minutes, other days 5-10 minutes    Time 4    Period Weeks    Status On-going    Target Date 03/09/20      PT SHORT TERM GOAL #3   Title Patient will be able to place a light object (</= 3 lbs) into shelf at shoulder height with only min difficulty    Time 4    Period Weeks    Status On-going    Target Date 04/06/20      PT SHORT TERM GOAL #4   Title Patient will report no difficulty getting in/out of a chair to improve transfer ability    Baseline Patient able to get in/out of standard chair without difficulty or pain    Time 4    Period Weeks    Status Achieved    Target Date 03/09/20             PT Long Term Goals - 02/10/20 1641      PT LONG TERM GOAL #1   Title Patient will be I with final HEP to maintain progress from PT    Time 8    Period Weeks    Status New    Target Date 04/06/20      PT LONG TERM GOAL #2   Title Patient will report improved functional level of shoulder to </= 38% limitation, lumbar to </= 52% limitation    Time 8    Period Weeks  Status New    Target Date 04/06/20      PT LONG TERM GOAL #3   Title Patient will exhibit  lumbar improvement of >/= 50% in all directions to improve ability to dress and perform self care without limitation    Time 8    Period Weeks    Status New    Target Date 04/06/20      PT LONG TERM GOAL #4   Title Patient will report no limitation with performing activities around home or with standing tasks    Time 8    Period Weeks    Status New    Target Date 04/06/20      PT LONG TERM GOAL #5   Title Patient will exhibit improved right shoulder strength grossly >/= 4+/5 MMT to be able to lift gallon of milk back into fridge without assist from other arm    Time 8    Period Weeks    Status New    Target Date 04/06/20                 Plan - 03/12/20 1521    Clinical Impression Statement Patient tolerated therapy well with no adverse effects. Todays visit focused more on shoulder stretching and strengthening with good tolerance. Added exercises to HEP. Patient also reported increased right hip tightness while walking in the clinic so did work on some stretching. Patient reported improvement in symptoms following therapy. He would benefit from continued skilled PT to improve his ability to stand and walk longer periods of time to be able to perform household tasks and activity without limitation    PT Treatment/Interventions ADLs/Self Care Home Management;Cryotherapy;Electrical Stimulation;Moist Heat;Iontophoresis 4mg /ml Dexamethasone;Traction;Ultrasound;Neuromuscular re-education;Balance training;Therapeutic exercise;Therapeutic activities;Functional mobility training;Stair training;Gait training;Patient/family education;Manual techniques;Dry needling;Passive range of motion;Taping;Spinal Manipulations;Joint Manipulations    PT Next Visit Plan Assess HEP and progress PRN, manual/stretching for hip and lumbar mobility, progress core/hip and postural control, light rotator cuff strengthening as able    PT Home Exercise Plan [email or web address]    Consulted and Agree with Plan of Care Patient             Patient will benefit from skilled therapeutic intervention in order to improve the following deficits and impairments:  Decreased range of motion, Difficulty walking, Abnormal gait, Decreased activity tolerance, Pain, Postural dysfunction, Decreased strength  Visit Diagnosis: Chronic bilateral low back pain, unspecified whether sciatica present  Chronic right shoulder pain  Stiffness of right shoulder, not elsewhere classified  Muscle weakness (generalized)     Problem List Patient Active Problem List   Diagnosis Date Noted  . Fatigue 01/15/2019  . Chronic systolic (congestive) heart failure ([place]) 09/18/2018  . Ischemic cardiomyopathy 09/16/2018  . Aortic regurgitation 09/16/2018  . Cardiomyopathy, unspecified ([place]) 06/13/2018  . Abscess of right axilla 12/12/2017  . BMI 33.0-33.9,adult 12/12/2017  . 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  . Acute blood loss anemia 10/25/2017  . Acute postoperative respiratory insufficiency 10/25/2017  . Postoperative delirium 10/25/2017  . Dyslipidemia 10/17/2017  . SOB (shortness of breath) 03/31/2017  . Long term current use of aspirin 03/29/2017  . Parkinson's disease ([place]) 01/02/2017  . Morbid (severe) obesity due to excess calories ([place]) 10/27/2015  . Memory change 07/06/2015  . Depression, recurrent ([place]) 07/06/2015  . Medial meniscus tear 10/11/2011  . Neuroma of foot 10/11/2011  . Coronary artery disease involving native coronary artery of native heart with angina pectoris ([place]) 12/28/2010  . 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 Mixed  hyperlipidemia 05/20/2010  . Hypertensive heart disease with heart failure ([place]) 05/20/2010  . 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 GERD 05/20/2010  . TRANSIENT ISCHEMIC ATTACK, HX OF 05/20/2010  . NEPHROLITHIASIS, HX OF 05/20/2010  . BENIGN PROSTATIC HYPERTROPHY, HX OF, S/P TURP 05/20/2010    Hilda Blades, PT, DPT, LAT, ATC 03/12/20  4:12  PM Phone: [phone] Fax: [place] [facility] [address] [place], [place], [address] Phone: [phone]   Fax:  [phone]  Name: DEQUAVIOUS HARSHBERGER MRN: [id number] Date of Birth: 09-11-1945

## 2020-03-18 ENCOUNTER — Ambulatory Visit: Payer: Medicare Other | Admitting: Physical Therapy

## 2020-03-18 ENCOUNTER — Encounter: Payer: Self-pay | Admitting: Physical Therapy

## 2020-03-18 ENCOUNTER — Other Ambulatory Visit: Payer: Self-pay

## 2020-03-18 DIAGNOSIS — M545 Low back pain, unspecified: Secondary | ICD-10-CM

## 2020-03-18 DIAGNOSIS — G8929 Other chronic pain: Secondary | ICD-10-CM

## 2020-03-18 DIAGNOSIS — M25611 Stiffness of right shoulder, not elsewhere classified: Secondary | ICD-10-CM

## 2020-03-18 DIAGNOSIS — M6281 Muscle weakness (generalized): Secondary | ICD-10-CM

## 2020-03-18 NOTE — Therapy (Signed)
[place], [place], [address] Phone: [phone]   Fax:  [phone]  Physical Therapy Treatment  Patient Details  Name: Shawn Meza MRN: [id number] Date of Birth: 1945-08-26 Referring Provider (PT): [name], [place]   Encounter Date: 03/18/2020   PT End of Session - 03/18/20 1533    Visit Number 8    Number of Visits 16    Date for PT Re-Evaluation 04/06/20    Authorization Type UHC MCR    Authorization Time Period KX after 15th visit    Progress Note Due on Visit 10    PT Start Time 1531    PT Stop Time 1611    PT Time Calculation (min) 40 min    Activity Tolerance Patient tolerated treatment well    Behavior During Therapy [facility] for tasks assessed/performed           Past Medical History:  Diagnosis Date  . Arthritis   . BPH (benign prostatic hypertrophy)   . Chronic coronary artery disease   . Colon cancer ([place])   . Elevated PSA   . Erectile dysfunction   . Essential hypertension   . Essential tremor   . GERD (gastroesophageal reflux disease)   . Headache(784.0)   . Hiatal hernia   . Hypercholesterolemia   . Long-term use of aspirin therapy   . Major depression, chronic   . Medial meniscus tear 10/11/2011  . Metabolic syndrome   . Morbid obesity ([place])   . Nephrolithiasis    hx of  . NSTEMI (non-ST elevated myocardial infarction) ([place])   . Parkinson's disease ([place])   . S/P CABG (coronary artery bypass graft)   . Transient ischemic attack    hx of  . Trochanteric bursitis of right hip     Past Surgical History:  Procedure Laterality Date  . CARDIAC CATHETERIZATION  5/12,1/13   4 stents placed  . COLON SURGERY    . CORONARY ARTERY BYPASS GRAFT    . KNEE ARTHROSCOPY  10/11/2011   Procedure: ARTHROSCOPY KNEE;  Surgeon: Lorn Junes, MD;  Location: [facility];  Service: Orthopedics;  Laterality: Left;  Left Knee Arthroscopy with Medial and Lateral  Partial Menisectomy, Chondroplasty  . LEFT HEART CATHETERIZATION WITH CORONARY ANGIOGRAM N/A 08/25/2011   Procedure: LEFT HEART CATHETERIZATION WITH CORONARY ANGIOGRAM;  Surgeon: Burnell Blanks, MD;  Location: [facility] CATH LAB;  Service: Cardiovascular;  Laterality: N/A;  . LITHOTRIPSY    . STERIOD INJECTION  10/11/2011   Procedure: STEROID INJECTION;  Surgeon: Lorn Junes, MD;  Location: [facility];  Service: Orthopedics;  Laterality: Right;  Steroid Injection Second Toe  . TRANSURETHRAL RESECTION OF PROSTATE    . URETHRAL DILATION      There were no vitals filed for this visit.   Subjective Assessment - 03/18/20 1530    Subjective Patient reports his back has been more stiff and sore. He did a lot of walking a few days ago and then he reports his legs starting feeling numb and he has to stop and sit for a while before he could walk again. he states he is ok when he is sitting but he has stiffness when he gets up and moves around.    Patient Stated Goals Get back and shoulder better so he can be more active    Currently in Pain? Yes    Pain Score 5     Pain Location Back  Pain Orientation Lower    Pain Descriptors / Indicators Tightness;Sore    Pain Type Chronic pain    Pain Onset More than a month ago    Pain Frequency Constant                             [facility] Adult PT Treatment/Exercise - 03/18/20 0001      Exercises   Exercises Shoulder;Lumbar;Knee/Hip      Lumbar Exercises: Stretches   Passive Hamstring Stretch 2 reps;30 seconds    Passive Hamstring Stretch Limitations supine    Single Knee to Chest Stretch 2 reps;30 seconds    Single Knee to Chest Stretch Limitations supine    Lower Trunk Rotation 5 reps;10 seconds    Piriformis Stretch 2 reps;30 seconds    Piriformis Stretch Limitations supine    Gastroc Stretch 2 reps;30 seconds    Gastroc Stretch Limitations slant board    Other Lumbar Stretch Exercise Seated lumbar flexion  with physioball 5 x 10 sec fwd and lateral each      Lumbar Exercises: Aerobic   Nustep L5 x 8 min (UE and LE)      Lumbar Exercises: Seated   Sit to Stand 10 reps      Lumbar Exercises: Supine   Bent Knee Raise 20 reps    Bridge 10 reps                  PT Education - 03/18/20 1532    Education Details HEP    Person(s) Educated Patient    Methods Explanation;Demonstration;Verbal cues    Comprehension Verbalized understanding;Returned demonstration;Verbal cues required;Need further instruction            PT Short Term Goals - 03/10/20 1537      PT SHORT TERM GOAL #1   Title Patient will be I with initial HEP to progress with PT    Baseline Patient continues to report inconsistency with HEP    Time 4    Period Weeks    Status On-going    Target Date 03/09/20      PT SHORT TERM GOAL #2   Title Patient will report improved standing ability >/= 20 minutes to improve ability to perform tasks around house and in yard    Baseline Patient reports continued limitation with standing, some days he can stand 20-30 minutes, other days 5-10 minutes    Time 4    Period Weeks    Status On-going    Target Date 03/09/20      PT SHORT TERM GOAL #3   Title Patient will be able to place a light object (</= 3 lbs) into shelf at shoulder height with only min difficulty    Time 4    Period Weeks    Status On-going    Target Date 04/06/20      PT SHORT TERM GOAL #4   Title Patient will report no difficulty getting in/out of a chair to improve transfer ability    Baseline Patient able to get in/out of standard chair without difficulty or pain    Time 4    Period Weeks    Status Achieved    Target Date 03/09/20             PT Long Term Goals - 02/10/20 1641      PT LONG TERM GOAL #1   Title Patient will be I with final HEP to maintain progress  from PT    Time 8    Period Weeks    Status New    Target Date 04/06/20      PT LONG TERM GOAL #2   Title Patient will  report improved functional level of shoulder to </= 38% limitation, lumbar to </= 52% limitation    Time 8    Period Weeks    Status New    Target Date 04/06/20      PT LONG TERM GOAL #3   Title Patient will exhibit lumbar improvement of >/= 50% in all directions to improve ability to dress and perform self care without limitation    Time 8    Period Weeks    Status New    Target Date 04/06/20      PT LONG TERM GOAL #4   Title Patient will report no limitation with performing activities around home or with standing tasks    Time 8    Period Weeks    Status New    Target Date 04/06/20      PT LONG TERM GOAL #5   Title Patient will exhibit improved right shoulder strength grossly >/= 4+/5 MMT to be able to lift gallon of milk back into fridge without assist from other arm    Time 8    Period Weeks    Status New    Target Date 04/06/20                 Plan - 03/18/20 1533    Clinical Impression Statement Patient tolerated therapy well with no adverse effects. Patient reported increased stiffness this visit so focused on stretching and improving mobility. He did report improvement in symptoms following therapy. He was able to tolerate some light core/hip strengthening without any increase in pain. He would benefit from continued skilled PT to improve his ability to stand and walk longer periods of time to be able to perform household tasks and activity without limitation    PT Treatment/Interventions ADLs/Self Care Home Management;Cryotherapy;Electrical Stimulation;Moist Heat;Iontophoresis 4mg /ml Dexamethasone;Traction;Ultrasound;Neuromuscular re-education;Balance training;Therapeutic exercise;Therapeutic activities;Functional mobility training;Stair training;Gait training;Patient/family education;Manual techniques;Dry needling;Passive range of motion;Taping;Spinal Manipulations;Joint Manipulations    PT Next Visit Plan Assess HEP and progress PRN, manual/stretching for hip and  lumbar mobility, progress core/hip and postural control, light rotator cuff strengthening as able    PT Home Exercise Plan [email or web address]    Consulted and Agree with Plan of Care Patient           Patient will benefit from skilled therapeutic intervention in order to improve the following deficits and impairments:  Decreased range of motion, Difficulty walking, Abnormal gait, Decreased activity tolerance, Pain, Postural dysfunction, Decreased strength  Visit Diagnosis: Chronic bilateral low back pain, unspecified whether sciatica present  Chronic right shoulder pain  Stiffness of right shoulder, not elsewhere classified  Muscle weakness (generalized)     Problem List Patient Active Problem List   Diagnosis Date Noted  . Fatigue 01/15/2019  . Chronic systolic (congestive) heart failure ([place]) 09/18/2018  . Ischemic cardiomyopathy 09/16/2018  . Aortic regurgitation 09/16/2018  . Cardiomyopathy, unspecified ([place]) 06/13/2018  . Abscess of right axilla 12/12/2017  . BMI 33.0-33.9,adult 12/12/2017  . 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  . Acute blood loss anemia 10/25/2017  . Acute postoperative respiratory insufficiency 10/25/2017  . Postoperative delirium 10/25/2017  . Dyslipidemia 10/17/2017  . SOB (shortness of breath) 03/31/2017  . Long term current use of aspirin 03/29/2017  . Parkinson's disease ([place]) 01/02/2017  .  Morbid (severe) obesity due to excess calories ([place]) 10/27/2015  . Memory change 07/06/2015  . Depression, recurrent ([place]) 07/06/2015  . Medial meniscus tear 10/11/2011  . Neuroma of foot 10/11/2011  . Coronary artery disease involving native coronary artery of native heart with angina pectoris ([place]) 12/28/2010  . 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 Mixed hyperlipidemia 05/20/2010  . Hypertensive heart disease with heart failure ([place]) 05/20/2010  . 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 GERD 05/20/2010  . TRANSIENT ISCHEMIC  ATTACK, HX OF 05/20/2010  . NEPHROLITHIASIS, HX OF 05/20/2010  . BENIGN PROSTATIC HYPERTROPHY, HX OF, S/P TURP 05/20/2010    Hilda Blades, PT, DPT, LAT, ATC 03/18/20  4:13 PM Phone: [phone] Fax: [facility] [address] [place], [place], [address] Phone: [phone]   Fax:  [phone]  Name: Shawn Meza MRN: [id number] Date of Birth: 22-Jun-1946

## 2020-03-23 ENCOUNTER — Ambulatory Visit: Payer: Medicare Other | Admitting: Physical Therapy

## 2020-03-23 ENCOUNTER — Encounter: Payer: Self-pay | Admitting: Physical Therapy

## 2020-03-23 ENCOUNTER — Other Ambulatory Visit: Payer: Self-pay

## 2020-03-23 DIAGNOSIS — M545 Low back pain, unspecified: Secondary | ICD-10-CM

## 2020-03-23 DIAGNOSIS — R2689 Other abnormalities of gait and mobility: Secondary | ICD-10-CM

## 2020-03-23 DIAGNOSIS — M6281 Muscle weakness (generalized): Secondary | ICD-10-CM

## 2020-03-23 DIAGNOSIS — M25611 Stiffness of right shoulder, not elsewhere classified: Secondary | ICD-10-CM

## 2020-03-23 DIAGNOSIS — G8929 Other chronic pain: Secondary | ICD-10-CM

## 2020-03-23 DIAGNOSIS — R2681 Unsteadiness on feet: Secondary | ICD-10-CM

## 2020-03-23 NOTE — Therapy (Signed)
[place], [place], [address] Phone: [phone]   Fax:  [phone]  Physical Therapy Treatment  Patient Details  Name: Shawn Meza MRN: [id number] Date of Birth: 16-Mar-1946 Referring Provider (PT): [name], [place]   Encounter Date: 03/23/2020   PT End of Session - 03/23/20 1554    Visit Number 9    Number of Visits 16    Date for PT Re-Evaluation 04/06/20    Authorization Type UHC MCR    Authorization Time Period KX after 15th visit    Progress Note Due on Visit 10    PT Start Time 1531    PT Stop Time 1615    PT Time Calculation (min) 44 min    Activity Tolerance Patient tolerated treatment well    Behavior During Therapy [facility] for tasks assessed/performed           Past Medical History:  Diagnosis Date  . Arthritis   . BPH (benign prostatic hypertrophy)   . Chronic coronary artery disease   . Colon cancer ([place])   . Elevated PSA   . Erectile dysfunction   . Essential hypertension   . Essential tremor   . GERD (gastroesophageal reflux disease)   . Headache(784.0)   . Hiatal hernia   . Hypercholesterolemia   . Long-term use of aspirin therapy   . Major depression, chronic   . Medial meniscus tear 10/11/2011  . Metabolic syndrome   . Morbid obesity ([place])   . Nephrolithiasis    hx of  . NSTEMI (non-ST elevated myocardial infarction) ([place])   . Parkinson's disease ([place])   . S/P CABG (coronary artery bypass graft)   . Transient ischemic attack    hx of  . Trochanteric bursitis of right hip     Past Surgical History:  Procedure Laterality Date  . CARDIAC CATHETERIZATION  5/12,1/13   4 stents placed  . COLON SURGERY    . CORONARY ARTERY BYPASS GRAFT    . KNEE ARTHROSCOPY  10/11/2011   Procedure: ARTHROSCOPY KNEE;  Surgeon: Lorn Junes, MD;  Location: [facility];  Service: Orthopedics;  Laterality: Left;  Left Knee Arthroscopy with Medial and Lateral  Partial Menisectomy, Chondroplasty  . LEFT HEART CATHETERIZATION WITH CORONARY ANGIOGRAM N/A 08/25/2011   Procedure: LEFT HEART CATHETERIZATION WITH CORONARY ANGIOGRAM;  Surgeon: Burnell Blanks, MD;  Location: [facility] CATH LAB;  Service: Cardiovascular;  Laterality: N/A;  . LITHOTRIPSY    . STERIOD INJECTION  10/11/2011   Procedure: STEROID INJECTION;  Surgeon: Lorn Junes, MD;  Location: [facility];  Service: Orthopedics;  Laterality: Right;  Steroid Injection Second Toe  . TRANSURETHRAL RESECTION OF PROSTATE    . URETHRAL DILATION      There were no vitals filed for this visit.   Subjective Assessment - 03/23/20 1539    Subjective Patient reports this past week has been the worst week for his lower back since start of therapy because his back has been tight and he can't do anything because it locks up on him. Once he sits for a few minutes the pain will go away. He does not his shoulder is feeling better but continues to be weak.    Patient Stated Goals Get back and shoulder better so he can be more active    Currently in Pain? Yes    Pain Score 5     Pain Location Back    Pain Orientation Lower;Mid;Right;Left  Pain Descriptors / Indicators Tightness;Sore;Aching    Pain Type Chronic pain    Pain Onset More than a month ago    Pain Frequency Intermittent    Aggravating Factors  Standing, walking, lifting, bending    Pain Relieving Factors Sit, rest    Effect of Pain on Daily Activities Patient is limited with tasks around house that require standing or bending over    Pain Score 0    Pain Location Shoulder    Pain Orientation Right              [facility] PT Assessment - 03/23/20 0001      AROM   Lumbar Flexion 75% - fingertips to mid-lower shin                         [facility] Adult PT Treatment/Exercise - 03/23/20 0001      Exercises   Exercises Shoulder;Lumbar;Knee/Hip      Lumbar Exercises: Stretches   Passive Hamstring Stretch 30  seconds    Passive Hamstring Stretch Limitations supine    Single Knee to Chest Stretch 30 seconds    Single Knee to Chest Stretch Limitations supine    Lower Trunk Rotation 10 seconds;3 reps    Hip Flexor Stretch 30 seconds    Hip Flexor Stretch Limitations supine edge of table    Piriformis Stretch 30 seconds    Piriformis Stretch Limitations supine    Other Lumbar Stretch Exercise Seated lumbar flexion with physioball 5 x 10 sec fwd and lateral each      Lumbar Exercises: Aerobic   Nustep L5 x 6 min (UE and LE)      Lumbar Exercises: Supine   Clam 10 reps    Clam Limitations yellow    Bent Knee Raise 20 reps    Bridge 10 reps    Straight Leg Raise 10 reps      Modalities   Modalities Electrical Stimulation;Moist Heat      Moist Heat Therapy   Number Minutes Moist Heat 10 Minutes    Moist Heat Location Lumbar Spine      Electrical Stimulation   Electrical Stimulation Location Lumbar    Electrical Stimulation Action IFC 80-150 x10 min    Electrical Stimulation Parameters Patient tolerance for intensity    Electrical Stimulation Goals Pain;Tone      Manual Therapy   Manual Therapy Soft tissue mobilization    Soft tissue mobilization IASTM to lumbar and thoracic paraspinals                  PT Education - 03/23/20 1546    Education Details HEP, stretching    Person(s) Educated Patient    Methods Explanation;Demonstration;Tactile cues;Verbal cues    Comprehension Verbalized understanding;Returned demonstration;Verbal cues required;Tactile cues required;Need further instruction            PT Short Term Goals - 03/10/20 1537      PT SHORT TERM GOAL #1   Title Patient will be I with initial HEP to progress with PT    Baseline Patient continues to report inconsistency with HEP    Time 4    Period Weeks    Status On-going    Target Date 03/09/20      PT SHORT TERM GOAL #2   Title Patient will report improved standing ability >/= 20 minutes to improve  ability to perform tasks around house and in yard    Baseline Patient reports continued limitation  with standing, some days he can stand 20-30 minutes, other days 5-10 minutes    Time 4    Period Weeks    Status On-going    Target Date 03/09/20      PT SHORT TERM GOAL #3   Title Patient will be able to place a light object (</= 3 lbs) into shelf at shoulder height with only min difficulty    Time 4    Period Weeks    Status On-going    Target Date 04/06/20      PT SHORT TERM GOAL #4   Title Patient will report no difficulty getting in/out of a chair to improve transfer ability    Baseline Patient able to get in/out of standard chair without difficulty or pain    Time 4    Period Weeks    Status Achieved    Target Date 03/09/20             PT Long Term Goals - 02/10/20 1641      PT LONG TERM GOAL #1   Title Patient will be I with final HEP to maintain progress from PT    Time 8    Period Weeks    Status New    Target Date 04/06/20      PT LONG TERM GOAL #2   Title Patient will report improved functional level of shoulder to </= 38% limitation, lumbar to </= 52% limitation    Time 8    Period Weeks    Status New    Target Date 04/06/20      PT LONG TERM GOAL #3   Title Patient will exhibit lumbar improvement of >/= 50% in all directions to improve ability to dress and perform self care without limitation    Time 8    Period Weeks    Status New    Target Date 04/06/20      PT LONG TERM GOAL #4   Title Patient will report no limitation with performing activities around home or with standing tasks    Time 8    Period Weeks    Status New    Target Date 04/06/20      PT LONG TERM GOAL #5   Title Patient will exhibit improved right shoulder strength grossly >/= 4+/5 MMT to be able to lift gallon of milk back into fridge without assist from other arm    Time 8    Period Weeks    Status New    Target Date 04/06/20                 Plan - 03/23/20 1717     Clinical Impression Statement Patient tolerated therapy well with no adverse effects. He continued to report increased bilateral mid-lower back tightness and locking up with activity so focused this visit on reducing feeling of spasm and pain, and reviewing HEP as tolerated. Used heat and e-stim to reduce pain and patient did report improvement in symptoms. He would benefit from continued skilled PT to improve his ability to stand and walk longer periods of time to be able to perform household tasks and activity without limitation    PT Treatment/Interventions ADLs/Self Care Home Management;Cryotherapy;Electrical Stimulation;Moist Heat;Iontophoresis 4mg /ml Dexamethasone;Traction;Ultrasound;Neuromuscular re-education;Balance training;Therapeutic exercise;Therapeutic activities;Functional mobility training;Stair training;Gait training;Patient/family education;Manual techniques;Dry needling;Passive range of motion;Taping;Spinal Manipulations;Joint Manipulations    PT Next Visit Plan Assess HEP and progress PRN, assess response to e-stim, manual/stretching for hip and lumbar mobility, progress core/hip and postural control, light  rotator cuff strengthening as able    PT Home Exercise Plan [email or web address]    Consulted and Agree with Plan of Care Patient           Patient will benefit from skilled therapeutic intervention in order to improve the following deficits and impairments:  Decreased range of motion, Difficulty walking, Abnormal gait, Decreased activity tolerance, Pain, Postural dysfunction, Decreased strength  Visit Diagnosis: Chronic bilateral low back pain, unspecified whether sciatica present  Chronic right shoulder pain  Stiffness of right shoulder, not elsewhere classified  Muscle weakness (generalized)  Unsteadiness on feet  Other abnormalities of gait and mobility     Problem List Patient Active Problem List   Diagnosis Date Noted  . Fatigue 01/15/2019  . Chronic systolic  (congestive) heart failure ([place]) 09/18/2018  . Ischemic cardiomyopathy 09/16/2018  . Aortic regurgitation 09/16/2018  . Cardiomyopathy, unspecified ([place]) 06/13/2018  . Abscess of right axilla 12/12/2017  . BMI 33.0-33.9,adult 12/12/2017  . 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  . Acute blood loss anemia 10/25/2017  . Acute postoperative respiratory insufficiency 10/25/2017  . Postoperative delirium 10/25/2017  . Dyslipidemia 10/17/2017  . SOB (shortness of breath) 03/31/2017  . Long term current use of aspirin 03/29/2017  . Parkinson's disease ([place]) 01/02/2017  . Morbid (severe) obesity due to excess calories ([place]) 10/27/2015  . Memory change 07/06/2015  . Depression, recurrent ([place]) 07/06/2015  . Medial meniscus tear 10/11/2011  . Neuroma of foot 10/11/2011  . Coronary artery disease involving native coronary artery of native heart with angina pectoris ([place]) 12/28/2010  . 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 Mixed hyperlipidemia 05/20/2010  . Hypertensive heart disease with heart failure ([place]) 05/20/2010  . 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 GERD 05/20/2010  . TRANSIENT ISCHEMIC ATTACK, HX OF 05/20/2010  . NEPHROLITHIASIS, HX OF 05/20/2010  . BENIGN PROSTATIC HYPERTROPHY, HX OF, S/P TURP 05/20/2010    Hilda Blades, PT, DPT, LAT, ATC 03/23/20  5:24 PM Phone: [phone] Fax: [facility] [address] [place], [place], [address] Phone: [phone]   Fax:  [phone]  Name: Shawn Meza MRN: [id number] Date of Birth: 04-04-1946

## 2020-03-25 ENCOUNTER — Ambulatory Visit: Payer: Medicare Other | Admitting: Physical Therapy

## 2020-03-25 ENCOUNTER — Encounter: Payer: Self-pay | Admitting: Physical Therapy

## 2020-03-25 ENCOUNTER — Other Ambulatory Visit: Payer: Self-pay

## 2020-03-25 DIAGNOSIS — G8929 Other chronic pain: Secondary | ICD-10-CM

## 2020-03-25 DIAGNOSIS — M545 Low back pain, unspecified: Secondary | ICD-10-CM

## 2020-03-25 DIAGNOSIS — M6281 Muscle weakness (generalized): Secondary | ICD-10-CM

## 2020-03-25 DIAGNOSIS — M25611 Stiffness of right shoulder, not elsewhere classified: Secondary | ICD-10-CM

## 2020-03-25 NOTE — Therapy (Signed)
[place], [place], [address] Phone: [phone]   Fax:  [phone]  Physical Therapy Treatment  Progress Note Reporting Period 02/10/2020 to 03/25/2020  See note below for Objective Data and Assessment of Progress/Goals.     Patient Details  Name: Shawn Meza MRN: [id number] Date of Birth: 1946-02-14 Referring Provider (PT): [name], [place]   Encounter Date: 03/25/2020   PT End of Session - 03/25/20 1525    Visit Number 10    Number of Visits 16    Date for PT Re-Evaluation 04/06/20    Authorization Type UHC MCR    Authorization Time Period KX after 15th visit    Progress Note Due on Visit 20    PT Start Time 1518    PT Stop Time 1613    PT Time Calculation (min) 55 min    Activity Tolerance Patient tolerated treatment well    Behavior During Therapy [facility] for tasks assessed/performed           Past Medical History:  Diagnosis Date  . Arthritis   . BPH (benign prostatic hypertrophy)   . Chronic coronary artery disease   . Colon cancer ([place])   . Elevated PSA   . Erectile dysfunction   . Essential hypertension   . Essential tremor   . GERD (gastroesophageal reflux disease)   . Headache(784.0)   . Hiatal hernia   . Hypercholesterolemia   . Long-term use of aspirin therapy   . Major depression, chronic   . Medial meniscus tear 10/11/2011  . Metabolic syndrome   . Morbid obesity ([place])   . Nephrolithiasis    hx of  . NSTEMI (non-ST elevated myocardial infarction) ([place])   . Parkinson's disease ([place])   . S/P CABG (coronary artery bypass graft)   . Transient ischemic attack    hx of  . Trochanteric bursitis of right hip     Past Surgical History:  Procedure Laterality Date  . CARDIAC CATHETERIZATION  5/12,1/13   4 stents placed  . COLON SURGERY    . CORONARY ARTERY BYPASS GRAFT    . KNEE ARTHROSCOPY  10/11/2011   Procedure: ARTHROSCOPY KNEE;  Surgeon: Lorn Junes,  MD;  Location: [place] [facility];  Service: Orthopedics;  Laterality: Left;  Left Knee Arthroscopy with Medial and Lateral Partial Menisectomy, Chondroplasty  . LEFT HEART CATHETERIZATION WITH CORONARY ANGIOGRAM N/A 08/25/2011   Procedure: LEFT HEART CATHETERIZATION WITH CORONARY ANGIOGRAM;  Surgeon: Burnell Blanks, MD;  Location: [facility] CATH LAB;  Service: Cardiovascular;  Laterality: N/A;  . LITHOTRIPSY    . STERIOD INJECTION  10/11/2011   Procedure: STEROID INJECTION;  Surgeon: Lorn Junes, MD;  Location: [place];  Service: Orthopedics;  Laterality: Right;  Steroid Injection Second Toe  . TRANSURETHRAL RESECTION OF PROSTATE    . URETHRAL DILATION      There were no vitals filed for this visit.   Subjective Assessment - 03/25/20 1521    Subjective Patient reports the tightness of his back has improved but he continues to have low energy. States that he has days where he feels really good and then he is sore and unable to do much the next 2 days and then feels good again.    How long can you sit comfortably? No limitation    How long can you stand comfortably? 15 minutes    How long can you walk comfortably? 15 minutes    Patient  Stated Goals Get back and shoulder better so he can be more active    Currently in Pain? Yes    Pain Score 4     Pain Location Back    Pain Orientation Lower;Mid;Right;Left    Pain Descriptors / Indicators Tightness;Sore;Aching    Pain Type Chronic pain    Pain Onset More than a month ago    Pain Frequency Intermittent    Aggravating Factors  Standing or walking extended periods    Pain Score 0    Pain Location Shoulder    Pain Orientation Right              [facility] PT Assessment - 03/25/20 0001      Assessment   Medical Diagnosis Chronic right shoulder pain, Chronic bilateral low back pain    Referring Provider (PT) [name], DO    Next MD Visit 09/10/2020      Precautions   Precautions None       Restrictions   Weight Bearing Restrictions No      Balance Screen   Has the patient fallen in the past 6 months No    Has the patient had a decrease in activity level because of a fear of falling?  No    Is the patient reluctant to leave their home because of a fear of falling?  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 for tasks assessed      Observation/Other Assessments   Focus on Therapeutic Outcomes (FOTO)  Shoulder: 41% limitation, Lumbar: 53% limitation      AROM   Right Shoulder Flexion 130 Degrees   shrug present   Right Shoulder ABduction 100 Degrees    Lumbar Flexion 75% - fingertips to mid-lower shin    Lumbar Extension 50%    Lumbar - Right Side Bend 50%    Lumbar - Left Side Bend 50%    Lumbar - Right Rotation 50%    Lumbar - Left Rotation 50%      Strength   Right Shoulder Flexion 4/5    Right Shoulder ABduction 4-/5    Right Shoulder External Rotation 4-/5                         [facility] Adult PT Treatment/Exercise - 03/25/20 0001      Self-Care   Self-Care Other Self-Care Comments    Other Self-Care Comments  FOTO review      Exercises   Exercises Shoulder;Lumbar;Knee/Hip      Lumbar Exercises: Stretches   Passive Hamstring Stretch 30 seconds    Passive Hamstring Stretch Limitations supine    Lower Trunk Rotation 3 reps;10 seconds    Hip Flexor Stretch 30 seconds    Hip Flexor Stretch Limitations supine edge of table    Piriformis Stretch 30 seconds    Piriformis Stretch Limitations supine    Other Lumbar Stretch Exercise Seated lumbar flexion with physioball 5 x 10 sec fwd and lateral each      Lumbar Exercises: Supine   Bridge 10 reps      Shoulder Exercises: Supine   Flexion 10 reps;AAROM    Flexion Limitations dowel      Shoulder Exercises: Seated   Row 10 reps   2 sets   Theraband Level (Shoulder Row) Level 2 (Red)  Shoulder Exercises: Standing   Flexion 10 reps    Shoulder Flexion Weight (lbs) 3    Flexion Limitations placing 3# weight in shelf at shoulder height      Shoulder Exercises: Pulleys   Flexion 3 minutes      Modalities   Modalities Electrical Stimulation;Moist Heat      Moist Heat Therapy   Number Minutes Moist Heat 15 Minutes    Moist Heat Location Lumbar Spine      Electrical Stimulation   Electrical Stimulation Location Lumbar    Electrical Stimulation Action IFC 80-150 x15 min    Electrical Stimulation Parameters Patient tolerance for intensity    Electrical Stimulation Goals Pain;Tone                  PT Education - 03/25/20 1524    Education Details HEP    Person(s) Educated Patient    Methods Explanation    Comprehension Verbalized understanding            PT Short Term Goals - 03/25/20 1550      PT SHORT TERM GOAL #1   Title Patient will be I with initial HEP to progress with PT    Baseline Patient continues to report inconsistency with HEP    Time 4    Period Weeks    Status On-going    Target Date 03/09/20      PT SHORT TERM GOAL #2   Title Patient will report improved standing ability >/= 20 minutes to improve ability to perform tasks around house and in yard    Baseline Patient reports continued limitation with standing, some days he can stand 20-30 minutes, other days 5-10 minutes    Time 4    Period Weeks    Status On-going    Target Date 03/09/20      PT SHORT TERM GOAL #3   Title Patient will be able to place a light object (</= 3 lbs) into shelf at shoulder height with only min difficulty    Time 4    Period Weeks    Status Achieved    Target Date 04/06/20      PT SHORT TERM GOAL #4   Title Patient will report no difficulty getting in/out of a chair to improve transfer ability    Baseline --    Time 4    Period Weeks    Status Achieved    Target Date 03/09/20             PT Long Term Goals - 03/25/20 1551      PT  LONG TERM GOAL #1   Title Patient will be I with final HEP to maintain progress from PT    Time 8    Period Weeks    Status On-going    Target Date 04/06/20      PT LONG TERM GOAL #2   Title Patient will report improved functional level of shoulder to </= 38% limitation, lumbar to </= 52% limitation    Time 8    Period Weeks    Status On-going    Target Date 04/06/20      PT LONG TERM GOAL #3   Title Patient will exhibit lumbar improvement of >/= 50% in all directions to improve ability to dress and perform self care without limitation    Time 8    Period Weeks    Status On-going    Target Date 04/06/20      PT LONG  TERM GOAL #4   Title Patient will report no limitation with performing activities around home or with standing tasks    Time 8    Period Weeks    Status On-going    Target Date 04/06/20      PT LONG TERM GOAL #5   Title Patient will exhibit improved right shoulder strength grossly >/= 4+/5 MMT to be able to lift gallon of milk back into fridge without assist from other arm    Time 8    Period Weeks    Status On-going    Target Date 04/06/20                 Plan - 03/25/20 1533    Clinical Impression Statement Patient tolerated therapy well with no adverse effects. He does exhibit improvement in motion and strength despite his report of increased back tightness and decreased energy level. Patient also reports improved functional level of FOTO regarding his back and shoulder. He does continue to have limitation in lumbar and shoulder motion and strength, and continued limitation with standing and walking endurance. Continued working on mobility and strengthening this visit, and did use heat and e-stim prior to therapy to reduce tightness and pain. He would benefit from continued skilled PT to improve his ability to stand and walk longer periods of time to be able to perform household tasks and activity without limitation    PT Treatment/Interventions ADLs/Self  Care Home Management;Cryotherapy;Electrical Stimulation;Moist Heat;Iontophoresis 4mg /ml Dexamethasone;Traction;Ultrasound;Neuromuscular re-education;Balance training;Therapeutic exercise;Therapeutic activities;Functional mobility training;Stair training;Gait training;Patient/family education;Manual techniques;Dry needling;Passive range of motion;Taping;Spinal Manipulations;Joint Manipulations    PT Next Visit Plan Assess HEP and progress PRN, assess response to e-stim, manual/stretching for hip and lumbar mobility, progress core/hip and postural control, light rotator cuff strengthening as able    PT Home Exercise Plan [id number]    Consulted and Agree with Plan of Care Patient           Patient will benefit from skilled therapeutic intervention in order to improve the following deficits and impairments:  Decreased range of motion, Difficulty walking, Abnormal gait, Decreased activity tolerance, Pain, Postural dysfunction, Decreased strength  Visit Diagnosis: Chronic bilateral low back pain, unspecified whether sciatica present  Chronic right shoulder pain  Stiffness of right shoulder, not elsewhere classified  Muscle weakness (generalized)     Problem List Patient Active Problem List   Diagnosis Date Noted  . Fatigue 01/15/2019  . Chronic systolic (congestive) heart failure ([place]) 09/18/2018  . Ischemic cardiomyopathy 09/16/2018  . Aortic regurgitation 09/16/2018  . Cardiomyopathy, unspecified ([place]) 06/13/2018  . Abscess of right axilla 12/12/2017  . BMI 33.0-33.9,adult 12/12/2017  . 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  . Acute blood loss anemia 10/25/2017  . Acute postoperative respiratory insufficiency 10/25/2017  . Postoperative delirium 10/25/2017  . Dyslipidemia 10/17/2017  . SOB (shortness of breath) 03/31/2017  . Long term current use of aspirin 03/29/2017  . Parkinson's disease ([place]) 01/02/2017  . Morbid (severe) obesity due to excess calories ([place])  10/27/2015  . Memory change 07/06/2015  . Depression, recurrent ([place]) 07/06/2015  . Medial meniscus tear 10/11/2011  . Neuroma of foot 10/11/2011  . Coronary artery disease involving native coronary artery of native heart with angina pectoris ([place]) 12/28/2010  . 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 Mixed hyperlipidemia 05/20/2010  . Hypertensive heart disease with heart failure ([place]) 05/20/2010  . 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 GERD 05/20/2010  . TRANSIENT ISCHEMIC ATTACK, HX OF 05/20/2010  .  NEPHROLITHIASIS, HX OF 05/20/2010  . BENIGN PROSTATIC HYPERTROPHY, HX OF, S/P TURP 05/20/2010    Hilda Blades, PT, DPT, LAT, ATC 03/25/20  4:25 PM Phone: [phone] Fax: [facility] [address] [place], [place], [address] Phone: [phone]   Fax:  [phone]  Name: Shawn Meza MRN: [id number] Date of Birth: 1946-06-01

## 2020-03-30 ENCOUNTER — Emergency Department (HOSPITAL_COMMUNITY): Payer: Medicare Other

## 2020-03-30 ENCOUNTER — Telehealth: Payer: Self-pay | Admitting: Physical Therapy

## 2020-03-30 ENCOUNTER — Ambulatory Visit: Payer: Medicare Other | Admitting: Physical Therapy

## 2020-03-30 ENCOUNTER — Emergency Department (EMERGENCY_DEPARTMENT_HOSPITAL)
Admission: EM | Admit: 2020-03-30 | Discharge: 2020-03-30 | Disposition: A | Discharge status: Home / Self Care | Payer: Medicare Other | Source: Home / Self Care | Attending: Emergency Medicine | Admitting: Emergency Medicine

## 2020-03-30 ENCOUNTER — Other Ambulatory Visit: Payer: Self-pay

## 2020-03-30 ENCOUNTER — Encounter (HOSPITAL_COMMUNITY): Payer: Self-pay

## 2020-03-30 DIAGNOSIS — H579 Unspecified disorder of eye and adnexa: Secondary | ICD-10-CM | POA: Diagnosis not present

## 2020-03-30 DIAGNOSIS — I6523 Occlusion and stenosis of bilateral carotid arteries: Secondary | ICD-10-CM | POA: Diagnosis not present

## 2020-03-30 DIAGNOSIS — H538 Other visual disturbances: Secondary | ICD-10-CM | POA: Insufficient documentation

## 2020-03-30 DIAGNOSIS — Z20822 Contact with and (suspected) exposure to covid-19: Secondary | ICD-10-CM | POA: Insufficient documentation

## 2020-03-30 DIAGNOSIS — I5022 Chronic systolic (congestive) heart failure: Secondary | ICD-10-CM | POA: Insufficient documentation

## 2020-03-30 DIAGNOSIS — I110 Hypertensive heart disease with heart failure: Secondary | ICD-10-CM | POA: Insufficient documentation

## 2020-03-30 DIAGNOSIS — R29810 Facial weakness: Secondary | ICD-10-CM | POA: Diagnosis not present

## 2020-03-30 DIAGNOSIS — Z7982 Long term (current) use of aspirin: Secondary | ICD-10-CM | POA: Insufficient documentation

## 2020-03-30 DIAGNOSIS — R05 Cough: Secondary | ICD-10-CM | POA: Diagnosis not present

## 2020-03-30 DIAGNOSIS — I10 Essential (primary) hypertension: Secondary | ICD-10-CM | POA: Diagnosis not present

## 2020-03-30 DIAGNOSIS — I639 Cerebral infarction, unspecified: Secondary | ICD-10-CM | POA: Diagnosis not present

## 2020-03-30 DIAGNOSIS — J3489 Other specified disorders of nose and nasal sinuses: Secondary | ICD-10-CM | POA: Diagnosis not present

## 2020-03-30 DIAGNOSIS — I499 Cardiac arrhythmia, unspecified: Secondary | ICD-10-CM | POA: Diagnosis not present

## 2020-03-30 DIAGNOSIS — I2510 Atherosclerotic heart disease of native coronary artery without angina pectoris: Secondary | ICD-10-CM | POA: Insufficient documentation

## 2020-03-30 DIAGNOSIS — Z79899 Other long term (current) drug therapy: Secondary | ICD-10-CM | POA: Insufficient documentation

## 2020-03-30 DIAGNOSIS — Z743 Need for continuous supervision: Secondary | ICD-10-CM | POA: Diagnosis not present

## 2020-03-30 DIAGNOSIS — Z951 Presence of aortocoronary bypass graft: Secondary | ICD-10-CM | POA: Insufficient documentation

## 2020-03-30 DIAGNOSIS — G4489 Other headache syndrome: Secondary | ICD-10-CM | POA: Diagnosis not present

## 2020-03-30 DIAGNOSIS — I672 Cerebral atherosclerosis: Secondary | ICD-10-CM | POA: Diagnosis not present

## 2020-03-30 DIAGNOSIS — H547 Unspecified visual loss: Secondary | ICD-10-CM | POA: Diagnosis not present

## 2020-03-30 LAB — COMPREHENSIVE METABOLIC PANEL
ALT: 8 U/L (ref 0–44)
AST: 28 U/L (ref 15–41)
Albumin: 3.9 g/dL (ref 3.5–5.0)
Alkaline Phosphatase: 54 U/L (ref 38–126)
Anion gap: 13 (ref 5–15)
BUN: 14 mg/dL (ref 8–23)
CO2: 22 mmol/L (ref 22–32)
Calcium: 9.2 mg/dL (ref 8.9–10.3)
Chloride: 104 mmol/L (ref 98–111)
Creatinine, Ser: 1.02 mg/dL (ref 0.61–1.24)
GFR calc Af Amer: >60 mL/min (ref >60)
GFR calc non Af Amer: >60 mL/min (ref >60)
Glucose, Bld: 114 mg/dL — ABNORMAL HIGH (ref 70–99)
Potassium: 4.3 mmol/L (ref 3.5–5.1)
Sodium: 139 mmol/L (ref 135–145)
Total Bilirubin: 0.7 mg/dL (ref 0.3–1.2)
Total Protein: 6.8 g/dL (ref 6.5–8.1)

## 2020-03-30 LAB — URINALYSIS, ROUTINE W REFLEX MICROSCOPIC
Bilirubin Urine: NEGATIVE
Glucose, UA: NEGATIVE mg/dL
Hgb urine dipstick: NEGATIVE
Ketones, ur: 5 mg/dL — AB
Leukocytes,Ua: NEGATIVE
Nitrite: NEGATIVE
Protein, ur: NEGATIVE mg/dL
Specific Gravity, Urine: 1.026 (ref 1.005–1.030)
pH: 5 (ref 5.0–8.0)

## 2020-03-30 LAB — SARS CORONAVIRUS 2 BY RT PCR (HOSPITAL ORDER, PERFORMED IN ~~LOC~~ HOSPITAL LAB): SARS Coronavirus 2: NEGATIVE

## 2020-03-30 LAB — DIFFERENTIAL
Abs Immature Granulocytes: 0.02 10*3/uL (ref 0.00–0.07)
Basophils Absolute: 0.1 10*3/uL (ref 0.0–0.1)
Basophils Relative: 1 %
Eosinophils Absolute: 0.3 10*3/uL (ref 0.0–0.5)
Eosinophils Relative: 3 %
Immature Granulocytes: 0 %
Lymphocytes Relative: 37 %
Lymphs Abs: 3.6 10*3/uL (ref 0.7–4.0)
Monocytes Absolute: 1 10*3/uL (ref 0.1–1.0)
Monocytes Relative: 10 %
Neutro Abs: 4.7 10*3/uL (ref 1.7–7.7)
Neutrophils Relative %: 49 %

## 2020-03-30 LAB — I-STAT CHEM 8, ED
BUN: 20 mg/dL (ref 8–23)
Calcium, Ion: 1.07 mmol/L — ABNORMAL LOW (ref 1.15–1.40)
Chloride: 107 mmol/L (ref 98–111)
Creatinine, Ser: 1 mg/dL (ref 0.61–1.24)
Glucose, Bld: 107 mg/dL — ABNORMAL HIGH (ref 70–99)
HCT: 46 % (ref 39.0–52.0)
Hemoglobin: 15.6 g/dL (ref 13.0–17.0)
Potassium: 5.2 mmol/L — ABNORMAL HIGH (ref 3.5–5.1)
Sodium: 138 mmol/L (ref 135–145)
TCO2: 24 mmol/L (ref 22–32)

## 2020-03-30 LAB — TROPONIN I (HIGH SENSITIVITY): Troponin I (High Sensitivity): 5 ng/L (ref <18)

## 2020-03-30 LAB — CBC
HCT: 48.3 % (ref 39.0–52.0)
Hemoglobin: 15.6 g/dL (ref 13.0–17.0)
MCH: 31.8 pg (ref 26.0–34.0)
MCHC: 32.3 g/dL (ref 30.0–36.0)
MCV: 98.4 fL (ref 80.0–100.0)
Platelets: 189 10*3/uL (ref 150–400)
RBC: 4.91 MIL/uL (ref 4.22–5.81)
RDW: 12.6 % (ref 11.5–15.5)
WBC: 9.6 10*3/uL (ref 4.0–10.5)
nRBC: 0 % (ref 0.0–0.2)

## 2020-03-30 LAB — PROTIME-INR
INR: 1 (ref 0.8–1.2)
Prothrombin Time: 13.1 seconds (ref 11.4–15.2)

## 2020-03-30 LAB — CBG MONITORING, ED: Glucose-Capillary: 109 mg/dL — ABNORMAL HIGH (ref 70–99)

## 2020-03-30 LAB — APTT: aPTT: 29 seconds (ref 24–36)

## 2020-03-30 MED ORDER — LORAZEPAM 2 MG/ML IJ SOLN
INTRAMUSCULAR | Status: AC
  Administered 2020-03-30: 1 mg via INTRAVENOUS
  Filled 2020-03-30: qty 1

## 2020-03-30 MED ORDER — TETRACAINE HCL 0.5 % OP SOLN
2.0000 [drp] | Freq: Once | OPHTHALMIC | Status: AC
  Administered 2020-03-30: 2 [drp] via OPHTHALMIC
  Filled 2020-03-30: qty 4

## 2020-03-30 MED ORDER — LORAZEPAM 2 MG/ML IJ SOLN: 1.0000 mg | Freq: Once | INTRAMUSCULAR | Status: AC | PRN

## 2020-03-30 MED ORDER — GADOBUTROL 1 MMOL/ML IV SOLN
8.0000 mL | Freq: Once | INTRAVENOUS | Status: AC | PRN
  Administered 2020-03-30: 8 mL via INTRAVENOUS

## 2020-03-30 MED ORDER — SODIUM CHLORIDE 0.9% FLUSH: 3.0000 mL | Freq: Once | INTRAVENOUS | Status: DC

## 2020-03-30 NOTE — Consult Note (Addendum)
Neurology Consultation  Reason for Consult: Code stroke blurred vision Referring Physician: Alvino Chapel  CC: Blurred vision  History is obtained from: Patient  HPI: Shawn Meza is a 74 y.o. male with past medical history of CABG, Parkinson's disease, myocardial infarct, obesity, essential tremor, hypertension.  Patient was feeling well up until 1445 today patient apparently got in his car and was pulling out of his driveway to go to physical therapy.  At that time he noticed a sudden onset of blurred vision which was greater on the left than the right.  EMS was called and patient was brought to [facility].  In route he stated that the vision was improving, however he still did have blurred vision.  The blurred vision was throughout all of his field and not partial field.  He denies any double vision, numbness, weakness.  He denies any form of headache however he has been having sinus issues.  He does state that he has dry eyes and goes to an ophthalmologist to put drops in his eyes.  However he never has had blurred vision from that issue.  LKW: [id number] tpa given?: no, symptoms improving Premorbid modified Rankin scale (mRS): 0  NIHSS 1a Level of Conscious.: 0 1b LOC Questions: 0 1c LOC Commands: 0 2 Best Gaze: 0 3 Visual: 0 4 Facial Palsy: 0 5a Motor Arm - left: 0 5b Motor Arm - Right: 0 6a Motor Leg - Left: 0 6b Motor Leg - Right: 0 7 Limb Ataxia: 0 8 Sensory: 0 9 Best Language: 0 10 Dysarthria: 0 11 Extinct. and Inatten.: 0 TOTAL: 0    Past Medical History:  Diagnosis Date   Arthritis    BPH (benign prostatic hypertrophy)    Chronic coronary artery disease    Colon cancer ([facility])    Elevated PSA    Erectile dysfunction    Essential hypertension    Essential tremor    GERD (gastroesophageal reflux disease)    Headache(784.0)    Hiatal hernia    Hypercholesterolemia    Long-term use of aspirin therapy    Major depression, chronic    Medial meniscus tear  11/12/2438   Metabolic syndrome    Morbid obesity ([facility])    Nephrolithiasis    hx of   NSTEMI (non-ST elevated myocardial infarction) ([place])    Parkinson's disease ([place])    S/P CABG (coronary artery bypass graft)    Transient ischemic attack    hx of   Trochanteric bursitis of right hip     Family History  Problem Relation Age of Onset   Heart disease Mother    Heart disease Father    Hyperlipidemia Father    Stroke Father    Hyperlipidemia Brother    Heart disease Brother    Coronary artery disease Other        family hx of male 1st degree relative ,11   Hyperlipidemia Other        family hx of   Hypertension Other        family hx of   Arthritis Other        family hx of   Healthy Daughter    Dementia Neg Hx    Social History:   reports that he has never smoked. He has never used smokeless tobacco. He reports that he does not drink alcohol and does not use drugs.  Medications  Current Facility-Administered Medications:    sodium chloride flush (NS) 0.9 % injection 3 mL, 3 mL,  Intravenous, Once, Carmin Muskrat, MD   tetracaine (PONTOCAINE) 0.5 % ophthalmic solution 2 drop, 2 drop, Both Eyes, Once, Renold Genta, MD  Current Outpatient Medications:    aspirin 81 MG tablet, Take 81 mg by mouth daily. , Disp: , Rfl:    Carbidopa-Levodopa ER (SINEMET CR) 25-100 MG tablet controlled release, TAKE 1 TABLET BY MOUTH 3 TIMES DAILY., Disp: 270 tablet, Rfl: 1   clopidogrel (PLAVIX) 75 MG tablet, TAKE 1 TABLET BY MOUTH ONCE DAILY., Disp: 30 tablet, Rfl: 1   ENTRESTO 24-26 MG, TAKE 1 TABLET BY MOUTH 2 TIMES DAILY., Disp: 180 tablet, Rfl: 0   ergocalciferol (VITAMIN D2) 1.25 MG (50000 UT) capsule, Take 1 capsule (50,000 Units total) by mouth once a week., Disp: 12 capsule, Rfl: 0   esomeprazole (NEXIUM) 40 MG capsule, Take 1 capsule by mouth daily., Disp: , Rfl:    fluocinonide-emollient (LIDEX-E) 0.05 % cream, Apply 1 application topically 2 (two) times daily., Disp: 60 g,  Rfl: 2   furosemide (LASIX) 40 MG tablet, Take 1 tablet (40 mg total) by mouth daily. PLEASE CALL OFFICE TO SCHEDULE FOLLOW UP APPOINTMENT, Disp: 30 tablet, Rfl: 0   gabapentin (NEURONTIN) 100 MG capsule, Take 1 capsule (100 mg total) by mouth at bedtime., Disp: 60 capsule, Rfl: 3   levocetirizine (XYZAL) 5 MG tablet, Take 1 tablet (5 mg total) by mouth every evening., Disp: 30 tablet, Rfl: 2   metoprolol tartrate (LOPRESSOR) 25 MG tablet, TAKE 1/2 TABLET BY MOUTH 2 TIMES DAILY., Disp: 90 tablet, Rfl: 0   Multiple Vitamin (MULTI-VITAMINS) TABS, Take 1 tablet by mouth daily. , Disp: , Rfl:    nitroGLYCERIN (NITROSTAT) 0.4 MG SL tablet, Place 1 tablet (0.4 mg total) under the tongue every 5 (five) minutes as needed. Chest pain, Disp: 25 tablet, Rfl: 11   ondansetron (ZOFRAN-ODT) 4 MG disintegrating tablet, Take 1 tablet (4 mg total) by mouth every 8 (eight) hours as needed for nausea or vomiting., Disp: 20 tablet, Rfl: 0   pravastatin (PRAVACHOL) 20 MG tablet, TAKE 1 TABLET BY MOUTH EVERY EVENING., Disp: 90 tablet, Rfl: 0   tamsulosin (FLOMAX) 0.4 MG CAPS capsule, Take 0.4 mg by mouth daily as needed (to relax the prostate). , Disp: , Rfl: 1   vortioxetine HBr (TRINTELLIX) 5 MG TABS tablet, Take 2 tablets (10 mg total) by mouth daily., Disp: 60 tablet, Rfl: 2  ROS:    General ROS: negative for - chills, fatigue, fever, night sweats, weight gain or weight loss Psychological ROS: negative for - behavioral disorder, hallucinations, memory difficulties, mood swings or suicidal ideation Ophthalmic ROS: Positive for - blurry vision ENT ROS: negative for - epistaxis, nasal discharge, oral lesions, sore throat, tinnitus or vertigo Allergy and Immunology ROS: negative for - hives or itchy/watery eyes Hematological and Lymphatic ROS: negative for - bleeding problems, bruising or swollen lymph nodes Endocrine ROS: negative for - galactorrhea, hair pattern changes, polydipsia/polyuria or temperature  intolerance Respiratory ROS: negative for - cough, hemoptysis, shortness of breath or wheezing Cardiovascular ROS: negative for - chest pain, dyspnea on exertion, edema or irregular heartbeat Gastrointestinal ROS: negative for - abdominal pain, diarrhea, hematemesis, nausea/vomiting or stool incontinence Genito-Urinary ROS: negative for - dysuria, hematuria, incontinence or urinary frequency/urgency Musculoskeletal ROS: negative for - joint swelling or muscular weakness Neurological ROS: as noted in HPI Dermatological ROS: negative for rash and skin lesion changes  Exam: Current vital signs: Wt 97.5 kg   BMI 34.69 kg/m  Vital signs in last 24 hours:  Weight:  [97.5 kg] 97.5 kg (08/30 1500)   Constitutional: Appears well-developed and well-nourished.  Psych: Affect appropriate to situation Eyes: No scleral injection HENT: No OP obstrucion Head: Normocephalic.  Cardiovascular: Normal rate and regular rhythm.  Respiratory: Effort normal, non-labored breathing GI: Soft.  No distension. There is no tenderness.  Skin: WDI  Neuro: Mental Status: Patient is awake, alert, oriented to person, place, month, year, and situation.  Patient speech is clear with no aphasia or dysarthria.  Naming, repeating, comprehension are intact.  Patient is able to give a good history and follow commands.  Cranial Nerves: II: Visual Fields are full.  III,IV, VI: EOMI without ptosis or diploplia. Pupils equal, round and reactive to light V: Facial sensation is symmetric to temperature VII: Facial movement is symmetric.  VIII: hearing is intact to voice X: Palat elevates symmetrically XI: Shoulder shrug is symmetric. XII: tongue is midline without atrophy or fasciculations.  Motor: Tone is normal. Bulk is normal. 5/5 strength was present in all four extremities.  Sensory: Sensation is symmetric to light touch and temperature in the arms and legs Deep Tendon Reflexes: 2+ and symmetric in the biceps and  patellae.  Plantars: Toes are downgoing bilaterally.  Cerebellar: FNF and HKS are intact bilaterally-he does have a resting tremor from his Parkinson's on the left arm and leg.  Labs I have reviewed labs in epic and the results pertinent to this consultation are:   CBC    Component Value Date/Time   WBC 13.5 (H) 02/13/2020 0335   RBC 5.01 02/13/2020 0335   HGB 15.6 03/30/2020 1602   HGB 13.9 01/22/2018 0905   HCT 46.0 03/30/2020 1602   HCT 43.5 01/22/2018 0905   PLT 245 02/13/2020 0335   PLT 189 01/22/2018 0905   MCV 96.0 02/13/2020 0335   MCV 94 01/22/2018 0905   MCH 31.7 02/13/2020 0335   MCHC 33.1 02/13/2020 0335   RDW 12.6 02/13/2020 0335   RDW 15.0 01/22/2018 0905   LYMPHSABS 2.7 01/22/2018 0905   MONOABS 0.8 02/13/2014 1440   EOSABS 0.4 01/22/2018 0905   BASOSABS 0.0 01/22/2018 0905    CMP     Component Value Date/Time   NA 138 03/30/2020 1602   NA 141 05/28/2019 1355   K 5.2 (H) 03/30/2020 1602   CL 107 03/30/2020 1602   CO2 22 02/13/2020 0335   GLUCOSE 107 (H) 03/30/2020 1602   BUN 20 03/30/2020 1602   BUN 15 05/28/2019 1355   CREATININE 1.00 03/30/2020 1602   CREATININE 1.03 05/20/2015 1410   CALCIUM 9.1 02/13/2020 0335   PROT 6.8 02/13/2020 0335   PROT 6.7 02/20/2019 1342   ALBUMIN 3.9 02/13/2020 0335   ALBUMIN 4.4 02/20/2019 1342   AST 32 02/13/2020 0335   ALT 31 02/13/2020 0335   ALKPHOS 61 02/13/2020 0335   BILITOT 1.1 02/13/2020 0335   BILITOT 0.4 02/20/2019 1342   GFRNONAA >60 02/13/2020 0335   GFRAA >60 02/13/2020 0335    Lipid Panel     Component Value Date/Time   CHOL 189 02/20/2019 1342   TRIG 180 (H) 02/20/2019 1342   TRIG 107 05/09/2010 0000   HDL 39 (L) 02/20/2019 1342   CHOLHDL 4.8 02/20/2019 1342   CHOLHDL 3.6 05/20/2015 1410   VLDL 27 05/20/2015 1410   LDLCALC 114 (H) 02/20/2019 1342     Imaging I have reviewed the images obtained:  CT-scan of the brain CT head did not show any mass, bleed, acute infarction  MRI  examination of the brain/orbits-pending  Etta Quill PA-C [facility] [phone]  M-F  (9:00 am- 5:00 PM)  03/30/2020, 4:13 PM     Assessment:  This is a 74 year old male presented to the hospital with sudden onset of bilateral blurred vision which was within all of his fields.  During transit with EMS he states that the blurred vision did improve significantly but however was still present.  On exam he did state that he continued blurred vision but was able to count fingers.  Extraocular movements were intact.  There was no disconjugate gaze.  NIH stroke scale was 0.  Symptoms were improving and minimal patient did not receive TPA.  After patient had CT of head he was brought to MRI to evaluate for MRI brain along with MRI orbits. MRI Brain was negative for any diffusion restriction concerning for a stroke. Unclear what the event was, suspect syncopal vs unlikely optic neuritis given binocular blurred vision. No trauma.   Impression: -Blurred vision - low suspicion for stroke. Recommendations: - MRI Brain without contrast was completed and negative for stroke. - MRI Orbits w and without contrast is pending.

## 2020-03-30 NOTE — Code Documentation (Addendum)
Shawn Meza is a 74 yr old man with history of Parkinson's and heart disease including CABG. He was normal today until he suddenly developed  blurry vision in both eyes at 1445. EMS was called, and Code stroke was paged out at 1533.Pt arrived at [facility] at [place]. Pt was alert, intact, except tremors on left arm and leg. He states his vision is still a little blurry, but acuity is WNL.CBG 109.  He was cleared by ED MD for CT, and arrived to CT at 1557.Pt's weight was 97.5kg. NCCT was WNL per Dr Sarita Haver, but a stat MRI needed to be done. Pt taken to MRI at 1614. Pt's NIHSS was 1 for mild ataxia of the left arm, which pt states is normal for him.Pt states vision is still not back to 100% although gross acuity is WNL. 1 mg ativan given by EDRN per order. MRI staff instructed to import DWI image when done.  At [place], Dr Lorrin Goodell stated that preliminary MRI images are negative for ischemia. He has ordered q 2 hr mNIHSS and VS for 12 hrs. I asked if He would like q 15 min VS and q 30 min mNIHSS until 1915, but he stated no. No TPA given as low suspicion for stroke, no NIR procedure as pt's exam is LVO negative. Handoff complete with Raquel Sarna and Barista.

## 2020-03-30 NOTE — Telephone Encounter (Signed)
Attempted to contact patient due to missed PT appointment. Left VM informing patient of missed appointment and reminding him of next scheduled appointment.    Hilda Blades, PT, DPT, LAT, ATC 03/30/20  4:11 PM Phone: [phone] Fax: [phone]

## 2020-03-30 NOTE — ED Notes (Signed)
Patient arrived in MRI.

## 2020-03-30 NOTE — ED Provider Notes (Signed)
[facility] EMERGENCY DEPARTMENT Provider Note   CSN: [id number] Arrival date & time: 03/30/20  1552  An emergency department physician performed an initial assessment on this suspected stroke patient at 1555.  History Chief Complaint  Patient presents with  . Stroke S/S blurred vision    Shawn Meza is a 74 y.o. male past medical history of hypertension, obesity, NSTEMI, TIA, CABG presenting to the ED today for sudden onset of blurred vision occurring at 51.  Patient was reportedly going about his usual day when he suddenly felt as if he could not see.  He states that he removed the sunglasses he was wearing at the time without improvement.  States that when he open or closed either eye, blurry vision remained.  He states that there is no specific distribution of his vision that was blurry.  EMS was called for this on their arrival upon the patient to be fully oriented, complaining of blurry vision which progressively improved in route.  No medications administered.  Code stroke was called in the field.  Neurology present bedside on patient arrival, no appreciable neurological deficits other than some vague blurry vision without any specific distributions  The history is provided by the patient.  Illness Quality:  Blurry vision Severity:  Moderate Onset quality:  Sudden Duration:  2 hours Timing:  Constant Progression:  Improving Chronicity:  New Context:  No specific triggers Associated symptoms: no abdominal pain, no chest pain, no cough, no fever, no headaches, no rash, no shortness of breath and no vomiting        Past Medical History:  Diagnosis Date  . Arthritis   . BPH (benign prostatic hypertrophy)   . Chronic coronary artery disease   . Colon cancer ([place])   . Elevated PSA   . Erectile dysfunction   . Essential hypertension   . Essential tremor   . GERD (gastroesophageal reflux disease)   . Headache(784.0)   . Hiatal hernia   .  Hypercholesterolemia   . Long-term use of aspirin therapy   . Major depression, chronic   . Medial meniscus tear 10/11/2011  . Metabolic syndrome   . Morbid obesity ([place])   . Nephrolithiasis    hx of  . NSTEMI (non-ST elevated myocardial infarction) ([place])   . Parkinson's disease ([place])   . S/P CABG (coronary artery bypass graft)   . Transient ischemic attack    hx of  . Trochanteric bursitis of right hip     Patient Active Problem List   Diagnosis Date Noted  . Fatigue 01/15/2019  . Chronic systolic (congestive) heart failure ([place]) 09/18/2018  . Ischemic cardiomyopathy 09/16/2018  . Aortic regurgitation 09/16/2018  . Cardiomyopathy, unspecified ([place]) 06/13/2018  . Abscess of right axilla 12/12/2017  . BMI 33.0-33.9,adult 12/12/2017  . 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  . Acute blood loss anemia 10/25/2017  . Acute postoperative respiratory insufficiency 10/25/2017  . Postoperative delirium 10/25/2017  . Dyslipidemia 10/17/2017  . SOB (shortness of breath) 03/31/2017  . Long term current use of aspirin 03/29/2017  . Parkinson's disease ([place]) 01/02/2017  . Morbid (severe) obesity due to excess calories ([place]) 10/27/2015  . Memory change 07/06/2015  . Depression, recurrent ([place]) 07/06/2015  . Medial meniscus tear 10/11/2011  . Neuroma of foot 10/11/2011  . Coronary artery disease involving native coronary artery of native heart with angina pectoris ([place]) 12/28/2010  . 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 Mixed hyperlipidemia 05/20/2010  . Hypertensive heart disease with  heart failure ([place]) 05/20/2010  . 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 GERD 05/20/2010  . TRANSIENT ISCHEMIC ATTACK, HX OF 05/20/2010  . NEPHROLITHIASIS, HX OF 05/20/2010  . BENIGN PROSTATIC HYPERTROPHY, HX OF, S/P TURP 05/20/2010    Past Surgical History:  Procedure Laterality Date  . CARDIAC CATHETERIZATION  5/12,1/13   4 stents placed  . COLON SURGERY    .  CORONARY ARTERY BYPASS GRAFT    . KNEE ARTHROSCOPY  10/11/2011   Procedure: ARTHROSCOPY KNEE;  Surgeon: Lorn Junes, MD;  Location: [facility];  Service: Orthopedics;  Laterality: Left;  Left Knee Arthroscopy with Medial and Lateral Partial Menisectomy, Chondroplasty  . LEFT HEART CATHETERIZATION WITH CORONARY ANGIOGRAM N/A 08/25/2011   Procedure: LEFT HEART CATHETERIZATION WITH CORONARY ANGIOGRAM;  Surgeon: Burnell Blanks, MD;  Location[facility] LAB;  Service: Cardiovascular;  Laterality: N/A;  . LITHOTRIPSY    . STERIOD INJECTION  10/11/2011   Procedure: STEROID INJECTION;  Surgeon: Lorn Junes, MD;  Location: [facility];  Service: Orthopedics;  Laterality: Right;  Steroid Injection Second Toe  . TRANSURETHRAL RESECTION OF PROSTATE    . URETHRAL DILATION         Family History  Problem Relation Age of Onset  . Heart disease Mother   . Heart disease Father   . Hyperlipidemia Father   . Stroke Father   . Hyperlipidemia Brother   . Heart disease Brother   . Coronary artery disease Other        family hx of male 1st degree relative ,71  . Hyperlipidemia Other        family hx of  . Hypertension Other        family hx of  . Arthritis Other        family hx of  . Healthy Daughter   . Dementia Neg Hx     Social History   Tobacco Use  . Smoking status: Never Smoker  . Smokeless tobacco: Never Used  Vaping Use  . Vaping Use: Never used  Substance Use Topics  . Alcohol use: No  . Drug use: No    Home Medications Prior to Admission medications   Medication Sig Start Date End Date Taking? Authorizing Provider  aspirin 81 MG tablet Take 81 mg by mouth at bedtime.    Yes [provider]  Carbidopa-Levodopa ER (SINEMET CR) 25-100 MG tablet controlled release TAKE 1 TABLET BY MOUTH 3 TIMES DAILY. Patient taking differently: Take 1 tablet by mouth See admin instructions. Take 1 tablet by mouth three times a day- 7 AM, 11 AM, and  4 PM 12/25/19  Yes [name], Eustace Quail, DO  Cholecalciferol (VITAMIN D-3 PO) Take 1 capsule by mouth daily with breakfast.   Yes [provider]  clopidogrel (PLAVIX) 75 MG tablet TAKE 1 TABLET BY MOUTH ONCE DAILY. Patient taking differently: Take 75 mg by mouth at bedtime.  03/03/20  Yes [name], Hilton Cork, MD  ENTRESTO 24-26 MG TAKE 1 TABLET BY MOUTH 2 TIMES DAILY. Patient taking differently: Take 1 tablet by mouth 2 (two) times daily.  12/20/19  Yes Richardo Priest, MD  ergocalciferol (VITAMIN D2) 1.25 MG (50000 UT) capsule Take 1 capsule (50,000 Units total) by mouth once a week. Patient taking differently: Take 50,000 Units by mouth every Friday.  01/23/20  Yes [name], DO  esomeprazole (NEXIUM) 40 MG capsule Take 40 mg by mouth at bedtime.  12/06/18  Yes  [provider]  ferrous sulfate (SLOW IRON) 160 (50 Fe) MG TBCR SR tablet Take 160 mg by mouth every other day.   Yes [provider]  fluocinonide-emollient (LIDEX-E) 0.05 % cream Apply 1 application topically 2 (two) times daily. Patient taking differently: Apply 1 application topically 2 (two) times daily as needed (to affected areas).  10/08/18  Yes Raylene Everts, MD  furosemide (LASIX) 40 MG tablet Take 1 tablet (40 mg total) by mouth daily. PLEASE CALL OFFICE TO SCHEDULE FOLLOW UP APPOINTMENT Patient taking differently: Take 40 mg by mouth daily.  11/22/19  Yes Richardo Priest, MD  levocetirizine (XYZAL) 5 MG tablet Take 1 tablet (5 mg total) by mouth every evening. 02/11/20  Yes [name], DO  metoprolol tartrate (LOPRESSOR) 25 MG tablet TAKE 1/2 TABLET BY MOUTH 2 TIMES DAILY. Patient taking differently: Take 12.5 mg by mouth 2 (two) times daily.  02/20/20  Yes Richardo Priest, MD  Multiple Vitamin (MULTI-VITAMINS) TABS Take 1 tablet by mouth daily.  11/06/17  Yes [provider]  nitroGLYCERIN (NITROSTAT) 0.4 MG SL tablet Place 1 tablet (0.4 mg total) under the tongue every 5 (five)  minutes as needed. Chest pain Patient taking differently: Place 0.4 mg under the tongue every 5 (five) minutes as needed for chest pain.  12/21/17  Yes Revankar, Reita Cliche, MD  ondansetron (ZOFRAN-ODT) 4 MG disintegrating tablet Take 1 tablet (4 mg total) by mouth every 8 (eight) hours as needed for nausea or vomiting. Patient taking differently: Take 4 mg by mouth every 8 (eight) hours as needed for nausea or vomiting (DISSOLVE ORALLY).  02/11/20  Yes [name], DO  pravastatin (PRAVACHOL) 20 MG tablet TAKE 1 TABLET BY MOUTH EVERY EVENING. Patient taking differently: Take 20 mg by mouth every evening.  12/11/19  Yes Richardo Priest, MD  prednisoLONE acetate (PRED FORTE) 1 % ophthalmic suspension Place 1 drop into both eyes 2 (two) times daily.   Yes [provider]  RESTASIS 0.05 % ophthalmic emulsion Place 1 drop into both eyes 2 (two) times daily. 03/12/20  Yes [provider]  tamsulosin (FLOMAX) 0.4 MG CAPS capsule Take 0.4 mg by mouth daily as needed (to relax the prostate).  11/16/15  Yes [provider]  TRINTELLIX 10 MG TABS tablet Take 10 mg by mouth daily. 02/11/20  Yes [provider]  gabapentin (NEURONTIN) 100 MG capsule Take 1 capsule (100 mg total) by mouth at bedtime. 12/17/18   Scot Jun, FNP  vortioxetine HBr (TRINTELLIX) 5 MG TABS tablet Take 2 tablets (10 mg total) by mouth daily. Patient not taking: Reported on 03/30/2020 02/11/20   [name], DO    Allergies    Testosterone, Fluoxetine, and Requip [ropinirole]  Review of Systems   Review of Systems  Constitutional: Negative for chills and fever.  HENT: Negative for facial swelling and voice change.   Eyes: Positive for visual disturbance. Negative for photophobia, pain and redness.  Respiratory: Negative for cough and shortness of breath.   Cardiovascular: Negative for chest pain and palpitations.  Gastrointestinal: Negative for abdominal pain and vomiting.    Genitourinary: Negative for difficulty urinating and dysuria.  Musculoskeletal: Negative for gait problem and joint swelling.  Skin: Negative for rash and wound.  Neurological: Negative for dizziness, light-headedness and headaches.  Psychiatric/Behavioral: Negative for confusion and suicidal ideas.    Physical Exam Updated Vital Signs BP 116/68   Pulse 73   Temp 98 F (36.7  C) (Oral)   Resp 14   Ht 5\' 6"  (1.676 m)   Wt 97.5 kg   SpO2 97%   BMI 34.69 kg/m   Physical Exam Constitutional:      General: He is not in acute distress. HENT:     Head: Normocephalic and atraumatic.     Mouth/Throat:     Mouth: Mucous membranes are moist.     Pharynx: Oropharynx is clear.  Eyes:     General: No visual field deficit or scleral icterus.    Pupils: Pupils are equal, round, and reactive to light.  Cardiovascular:     Rate and Rhythm: Normal rate and regular rhythm.     Pulses: Normal pulses.  Pulmonary:     Effort: Pulmonary effort is normal. No respiratory distress.  Abdominal:     General: There is no distension.     Tenderness: There is no abdominal tenderness.  Musculoskeletal:        General: No tenderness or deformity.     Cervical back: Normal range of motion and neck supple.  Neurological:     General: No focal deficit present.     Mental Status: He is alert and oriented to person, place, and time.     Cranial Nerves: No cranial nerve deficit, dysarthria or facial asymmetry.     Sensory: No sensory deficit.     Motor: Tremor present. No weakness.     Coordination: Coordination normal.     Comments: Grossly worsening vision with no specific distribution.  Resting tremor throughout.  Psychiatric:        Mood and Affect: Mood normal.        Behavior: Behavior normal.     ED Results / Procedures / Treatments   Labs (all labs ordered are listed, but only abnormal results are displayed) Labs Reviewed  COMPREHENSIVE METABOLIC PANEL - Abnormal; Notable for the  following components:      Result Value   Glucose, Bld 114 (*)    All other components within normal limits  URINALYSIS, ROUTINE W REFLEX MICROSCOPIC - Abnormal; Notable for the following components:   APPearance HAZY (*)    Ketones, ur 5 (*)    All other components within normal limits  I-STAT CHEM 8, ED - Abnormal; Notable for the following components:   Potassium 5.2 (*)    Glucose, Bld 107 (*)    Calcium, Ion 1.07 (*)    All other components within normal limits  CBG MONITORING, ED - Abnormal; Notable for the following components:   Glucose-Capillary 109 (*)    All other components within normal limits  SARS CORONAVIRUS 2 BY RT PCR (HOSPITAL ORDER, [facility])  PROTIME-INR  APTT  CBC  DIFFERENTIAL  TROPONIN I (HIGH SENSITIVITY)    EKG None  Radiology MR ANGIO HEAD WO CONTRAST  Result Date: 03/30/2020 CLINICAL DATA:  Neuro deficit, acute, stroke suspected. Additional history provided: Sudden onset blurred vision greater on the left. EXAM: MRI HEAD WITHOUT CONTRAST MRA HEAD WITHOUT CONTRAST TECHNIQUE: Multiplanar, multiecho pulse sequences of the brain and surrounding structures were obtained without intravenous contrast. Angiographic images of the head were obtained using MRA technique without contrast. COMPARISON:  Noncontrast head CT performed earlier the same day 03/30/2020. MRI/MRA head 05/10/2010. FINDINGS: MRI HEAD FINDINGS Brain: Mild intermittent motion degradation. Cerebral volume is normal for age. Mild scattered T2/FLAIR hyperintensity within the cerebral white matter is nonspecific, but consistent with chronic small vessel ischemic disease. These findings have progressed  as compared to the MRI of 05/10/2010. There is no acute infarct. No evidence of intracranial mass. No chronic intracranial blood products. No extra-axial fluid collection. No midline shift. Vascular: Reported below. Skull and upper cervical spine: No focal marrow lesion.  Sinuses/Orbits: Orbits reported separately. Mild ethmoid sinus mucosal thickening. No significant mastoid effusion. MRA HEAD FINDINGS The intracranial internal carotid arteries are patent. As before, there is atherosclerotic irregularity of both intracranial internal carotid arteries with mild stenosis of the cavernous segment on the right. The M1 middle cerebral arteries are patent without significant stenosis. No M2 proximal branch occlusion or high-grade proximal stenosis is identified. The anterior cerebral arteries are patent. No intracranial aneurysm is identified. The non dominant intracranial right vertebral artery is developmentally diminutive, but patent, and terminates as the right PICA. The dominant intracranial left vertebral artery is patent without significant stenosis, as is the basilar artery. The posterior cerebral arteries are patent proximally without significant stenosis. Posterior communicating arteries are hypoplastic or absent bilaterally. IMPRESSION: MRI brain: 1. No evidence of acute intracranial abnormality, including acute infarction. 2. Mild cerebral white matter chronic small vessel ischemic changes, progressed as compared to the MRI of 05/10/2010. 3. Mild ethmoid sinus mucosal thickening. MRA head: 1. No intracranial large vessel occlusion or proximal high-grade arterial stenosis. 2. Mild atherosclerotic irregularity of the right greater than left intracranial internal carotid arteries as described. Electronically Signed   By: Kellie Simmering DO   On: 03/30/2020 19:12   MR BRAIN WO CONTRAST  Result Date: 03/30/2020 CLINICAL DATA:  Neuro deficit, acute, stroke suspected. Additional history provided: Sudden onset blurred vision greater on the left. EXAM: MRI HEAD WITHOUT CONTRAST MRA HEAD WITHOUT CONTRAST TECHNIQUE: Multiplanar, multiecho pulse sequences of the brain and surrounding structures were obtained without intravenous contrast. Angiographic images of the head were obtained  using MRA technique without contrast. COMPARISON:  Noncontrast head CT performed earlier the same day 03/30/2020. MRI/MRA head 05/10/2010. FINDINGS: MRI HEAD FINDINGS Brain: Mild intermittent motion degradation. Cerebral volume is normal for age. Mild scattered T2/FLAIR hyperintensity within the cerebral white matter is nonspecific, but consistent with chronic small vessel ischemic disease. These findings have progressed as compared to the MRI of 05/10/2010. There is no acute infarct. No evidence of intracranial mass. No chronic intracranial blood products. No extra-axial fluid collection. No midline shift. Vascular: Reported below. Skull and upper cervical spine: No focal marrow lesion. Sinuses/Orbits: Orbits reported separately. Mild ethmoid sinus mucosal thickening. No significant mastoid effusion. MRA HEAD FINDINGS The intracranial internal carotid arteries are patent. As before, there is atherosclerotic irregularity of both intracranial internal carotid arteries with mild stenosis of the cavernous segment on the right. The M1 middle cerebral arteries are patent without significant stenosis. No M2 proximal branch occlusion or high-grade proximal stenosis is identified. The anterior cerebral arteries are patent. No intracranial aneurysm is identified. The non dominant intracranial right vertebral artery is developmentally diminutive, but patent, and terminates as the right PICA. The dominant intracranial left vertebral artery is patent without significant stenosis, as is the basilar artery. The posterior cerebral arteries are patent proximally without significant stenosis. Posterior communicating arteries are hypoplastic or absent bilaterally. IMPRESSION: MRI brain: 1. No evidence of acute intracranial abnormality, including acute infarction. 2. Mild cerebral white matter chronic small vessel ischemic changes, progressed as compared to the MRI of 05/10/2010. 3. Mild ethmoid sinus mucosal thickening. MRA head: 1.  No intracranial large vessel occlusion or proximal high-grade arterial stenosis. 2. Mild atherosclerotic irregularity of the right greater than  left intracranial internal carotid arteries as described. Electronically Signed   By: Kellie Simmering DO   On: 03/30/2020 19:12   DG Chest Portable 1 View  Result Date: 03/30/2020 CLINICAL DATA:  Cough blurry vision EXAM: PORTABLE CHEST 1 VIEW COMPARISON:  11/04/2017 FINDINGS: Post sternotomy changes. No focal opacity or pleural effusion. Stable cardiomediastinal silhouette with aortic atherosclerosis. No pneumothorax. IMPRESSION: No active disease. Electronically Signed   By: Donavan Foil M.D.   On: 03/30/2020 18:49   CT HEAD CODE STROKE WO CONTRAST  Result Date: 03/30/2020 CLINICAL DATA:  Blurry vision EXAM: CT HEAD WITHOUT CONTRAST TECHNIQUE: Contiguous axial images were obtained from the base of the skull through the vertex without intravenous contrast. COMPARISON:  2016 FINDINGS: Brain: There is no acute intracranial hemorrhage, mass effect, or edema. Gray-white differentiation is preserved. Ventricles and sulci are normal in size and configuration. No extra-axial fluid collection. Vascular: No hyperdense vessel. Diffuse intracranial atherosclerotic calcification at the skull base. Skull: Unremarkable. Sinuses/Orbits: No acute abnormality. Other: Mastoid air cells are clear. ASPECTS ([place] Stroke Program Early CT Score) - Ganglionic level infarction (caudate, lentiform nuclei, internal capsule, insula, M1-M3 cortex): 7 - Supraganglionic infarction (M4-M6 cortex): 3 Total score (0-10 with 10 being normal): 10 IMPRESSION: No acute intracranial hemorrhage or evidence of acute infarction. These results were communicated to Dr. Lorrin Goodell At 4:19 pmon 8/30/2021by text page via the [facility] messaging system. Electronically Signed   By: Macy Mis M.D.   On: 03/30/2020 16:20   MR ORBITS W WO CONTRAST  Result Date: 03/30/2020 CLINICAL DATA:  Vision loss, binocular,  blurred vision. EXAM: MRI OF THE ORBITS WITHOUT AND WITH CONTRAST TECHNIQUE: Multiplanar, multisequence MR imaging of the orbits was performed both before and after the administration of intravenous contrast. CONTRAST:  92mL GADAVIST GADOBUTROL 1 MMOL/ML IV SOLN COMPARISON:  Concurrently performed non-contrast MRI of the brain and MRA of the head. CT of the orbits 12/05/2015. FINDINGS: Mildly motion degraded examination. The globes are normal in size and contour. The extraocular muscles and optic nerve sheath complexes are symmetric and unremarkable. No intraorbital mass or abnormal intraorbital enhancement is identified. The suprasellar cistern is patent. There is no mass effect upon the optic chiasm. The visualized periorbital and maxillofacial soft tissues are unremarkable. Mild ethmoid sinus mucosal thickening. IMPRESSION: Mildly motion degraded examination. Unremarkable MRI appearance of the orbits with and without contrast. No evidence of acute orbital abnormality. Mild ethmoid sinus mucosal thickening. Electronically Signed   By: Kellie Simmering DO   On: 03/30/2020 19:20    Procedures Procedures (including critical care time)  Medications Ordered in ED Medications  sodium chloride flush (NS) 0.9 % injection 3 mL (3 mLs Intravenous Not Given 03/30/20 1616)  tetracaine (PONTOCAINE) 0.5 % ophthalmic solution 2 drop (2 drops Both Eyes Given 03/30/20 1743)  LORazepam (ATIVAN) injection 1 mg (1 mg Intravenous Given 03/30/20 1616)  gadobutrol (GADAVIST) 1 MMOL/ML injection 8 mL (8 mLs Intravenous Contrast Given 03/30/20 1836)    ED Course  I have reviewed the triage vital signs and the nursing notes.  Pertinent labs & imaging results that were available during my care of the patient were reviewed by me and considered in my medical decision making (see chart for details).    MDM Rules/Calculators/A&P                         EKG findings by my read: Compared to prior: 07/31/2018.  Rate: 85 rhythm: sinus  Axis: appropriate  PR: Grossly appropriate QRS: 113 QTc: 455.  Left bundle branch block pattern similar to prior, otherwise no evidence of ischemia or arrhythmia, nor any other pathologic findings concerning considering patient presentation. Findings discussed with attending who agrees.  Differential diagnosis considered: CVA, ICH, glaucoma, presyncope, CRAO, CRVO, arrhythmia  Patient presenting for relatively sudden onset of binocular vision loss, improving since onset and no specific distribution of vision loss, findings concerning for stroke prehospital however on patient's arrival with neurology assessment, lower concern for CVA given the symmetric distribution of the symptoms and no new other associated symptoms that would suggest a anatomical distribution of the patient's complaints.  Emergent CT head performed posterior protocol showing no clinically significant changes.  MRI brain, MRA brain, MRI orbits obtained showing no clinically significant findings.  Tono-Pen pressures obtained of the lateral eyes, 12 which is reassuring.  The entirety of the patient's work-up this time is reassuring, his blurry vision is nearly resolved and have no major concerns for infection, patient counseled how to obtain results from COVID-19 testing though low concern for COVID-19.  Patient counseled to follow-up with PCP as well as ophthalmology outpatient setting expressed understanding and willingness to do so.  At this time I feel patient stable for discharge home.  Final Clinical Impression(s) / ED Diagnoses Final diagnoses:  Blurry vision, bilateral    Rx / DC Orders ED Discharge Orders    None     Labs, studies and imaging reviewed by myself and considered in medical decision making if ordered. Imaging interpreted by radiology. Pt was discussed with my attending, Dr. Alvino Chapel  Electronically signed by:  Roderic Palau [place]8/31/202112:49 AM       Renold Genta, MD 03/31/20 [id number]      Davonna Belling, MD 03/31/20 [phone]

## 2020-03-30 NOTE — ED Triage Notes (Signed)
Pt arrived via [facility] w/ C/O stoke-like s/s. Pt EWY [id number]. Pt had sudden onset of blurry vision at 1445 while driving. L eye more blurry than the other. While w/ EMS pt began to experience moderate relief of s/s. Pt still c/o blurry vision more pronounced in L eye, but reports it is 75% better. Pt A&Ox4, VSS.

## 2020-04-01 ENCOUNTER — Encounter: Payer: Self-pay | Admitting: Physical Therapy

## 2020-04-01 ENCOUNTER — Other Ambulatory Visit: Payer: Self-pay

## 2020-04-01 ENCOUNTER — Ambulatory Visit: Payer: Medicare Other | Attending: Family Medicine | Admitting: Physical Therapy

## 2020-04-01 DIAGNOSIS — M6281 Muscle weakness (generalized): Secondary | ICD-10-CM | POA: Diagnosis present

## 2020-04-01 DIAGNOSIS — M25511 Pain in right shoulder: Secondary | ICD-10-CM | POA: Insufficient documentation

## 2020-04-01 DIAGNOSIS — G8929 Other chronic pain: Secondary | ICD-10-CM

## 2020-04-01 DIAGNOSIS — M25611 Stiffness of right shoulder, not elsewhere classified: Secondary | ICD-10-CM | POA: Diagnosis present

## 2020-04-01 DIAGNOSIS — M545 Low back pain, unspecified: Secondary | ICD-10-CM

## 2020-04-01 NOTE — Therapy (Addendum)
[place], [place], [address] Phone: [phone]   Fax:  [phone]  Physical Therapy Treatment / ERO / Discharge  Patient Details  Name: Shawn Meza MRN: [id number] Date of Birth: 02/16/1946 Referring Provider (PT): [name], [place]   Encounter Date: 04/01/2020   PT End of Session - 04/01/20 1544    Visit Number 11    Number of Visits 16    Date for PT Re-Evaluation 05/27/20    Authorization Type UHC MCR    Authorization Time Period KX after 15th visit    Progress Note Due on Visit 20    PT Start Time 1530    PT Stop Time 1625    PT Time Calculation (min) 55 min    Activity Tolerance Patient tolerated treatment well    Behavior During Therapy [facility] for tasks assessed/performed           Past Medical History:  Diagnosis Date  . Arthritis   . BPH (benign prostatic hypertrophy)   . Chronic coronary artery disease   . Colon cancer ([place])   . Elevated PSA   . Erectile dysfunction   . Essential hypertension   . Essential tremor   . GERD (gastroesophageal reflux disease)   . Headache(784.0)   . Hiatal hernia   . Hypercholesterolemia   . Long-term use of aspirin therapy   . Major depression, chronic   . Medial meniscus tear 10/11/2011  . Metabolic syndrome   . Morbid obesity ([place])   . Nephrolithiasis    hx of  . NSTEMI (non-ST elevated myocardial infarction) ([place])   . Parkinson's disease ([place])   . S/P CABG (coronary artery bypass graft)   . Transient ischemic attack    hx of  . Trochanteric bursitis of right hip     Past Surgical History:  Procedure Laterality Date  . CARDIAC CATHETERIZATION  5/12,1/13   4 stents placed  . COLON SURGERY    . CORONARY ARTERY BYPASS GRAFT    . KNEE ARTHROSCOPY  10/11/2011   Procedure: ARTHROSCOPY KNEE;  Surgeon: Lorn Junes, MD;  Location: [place];  Service: Orthopedics;  Laterality: Left;  Left Knee Arthroscopy with  Medial and Lateral Partial Menisectomy, Chondroplasty  . LEFT HEART CATHETERIZATION WITH CORONARY ANGIOGRAM N/A 08/25/2011   Procedure: LEFT HEART CATHETERIZATION WITH CORONARY ANGIOGRAM;  Surgeon: Burnell Blanks, MD;  Location: [facility] CATH LAB;  Service: Cardiovascular;  Laterality: N/A;  . LITHOTRIPSY    . STERIOD INJECTION  10/11/2011   Procedure: STEROID INJECTION;  Surgeon: Lorn Junes, MD;  Location: [place];  Service: Orthopedics;  Laterality: Right;  Steroid Injection Second Toe  . TRANSURETHRAL RESECTION OF PROSTATE    . URETHRAL DILATION      There were no vitals filed for this visit.   Subjective Assessment - 04/01/20 1531    Subjective Patient reports he went to the ER on Tuesday due to vision problems and blood pressure issues. He states that all testing was negative. He reports that he is still having fuzzy vision.    Pertinent History Parkinson disease, depression/anxiety, hx of CABG x5    Limitations Standing;Walking;House hold activities;Lifting    How long can you sit comfortably? No limitation    How long can you stand comfortably? 15 minutes    How long can you walk comfortably? 15 minutes    Patient Stated Goals Get back and shoulder better so he can be  more active    Currently in Pain? Yes    Pain Score 3     Pain Location Back    Pain Orientation Lower    Pain Descriptors / Indicators Tightness;Sore    Pain Type Chronic pain    Pain Onset More than a month ago    Pain Frequency Intermittent    Aggravating Factors  Standing or walking extended periods    Pain Relieving Factors Sit, rest    Effect of Pain on Daily Activities Patient is limited with tasks around house that require standing or bending over    Pain Score 0    Pain Location Shoulder    Pain Orientation Right              [facility] PT Assessment - 04/01/20 0001      Assessment   Medical Diagnosis Chronic right shoulder pain, Chronic bilateral low back pain    Referring  Provider (PT) [name], DO    Next MD Visit 09/10/2020      Precautions   Precautions None      Restrictions   Weight Bearing Restrictions No      Balance Screen   Has the patient fallen in the past 6 months No    Has the patient had a decrease in activity level because of a fear of falling?  No    Is the patient reluctant to leave their home because of a fear of falling?  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 for tasks assessed      Observation/Other Assessments   Focus on Therapeutic Outcomes (FOTO)  Shoulder: 41% limitation, Lumbar: 53% limitation      AROM   Right Shoulder Flexion 130 Degrees   shrug present   Right Shoulder ABduction 100 Degrees    Lumbar Flexion 75% - fingertips to mid-lower shin    Lumbar Extension 50%    Lumbar - Right Side Bend 50%    Lumbar - Left Side Bend 50%    Lumbar - Right Rotation 50%    Lumbar - Left Rotation 50%      Strength   Right Shoulder Flexion 4/5    Right Shoulder ABduction 4-/5    Right Shoulder External Rotation 4-/5    Right Hip Flexion 4/5    Right Hip Extension 3+/5    Right Hip ABduction 3/5    Left Hip Flexion 4/5    Left Hip Extension 3+/5    Left Hip ABduction 3/5    Right Knee Flexion 4/5    Right Knee Extension 4/5    Left Knee Flexion 4/5    Left Knee Extension 4/5      Flexibility   Hamstrings Limited bilaterally    Piriformis Limited bilaterally                         [facility] Adult PT Treatment/Exercise - 04/01/20 0001      Exercises   Exercises Shoulder;Lumbar;Knee/Hip      Lumbar Exercises: Stretches   Lower Trunk Rotation 5 reps;10 seconds    Other Lumbar Stretch Exercise Seated lumbar flexion with physioball 5 x 10 sec fwd and lateral each      Lumbar Exercises: Aerobic   Nustep L5 x 8 min (UE and LE)      Lumbar  Exercises: Supine   Bridge 10 reps   2 sets    Bridge Limitations partial range    Straight Leg Raise 10 reps   2 sets     Lumbar Exercises: Sidelying   Clam 15 reps   2 sets     Shoulder Exercises: Supine   Flexion Strengthening;15 reps   2 sets   Flexion Limitations dowel press with 3#      Shoulder Exercises: Seated   Row 10 reps   2 sets   Theraband Level (Shoulder Row) Level 2 (Red)    Other Seated Exercises Overhead flexion AAROm with dowel x10      Shoulder Exercises: Pulleys   Flexion 3 minutes      Modalities   Modalities Electrical Stimulation;Moist Heat      Moist Heat Therapy   Number Minutes Moist Heat 15 Minutes    Moist Heat Location Lumbar Spine      Electrical Stimulation   Electrical Stimulation Location Lumbar    Electrical Stimulation Action IFC 80-150 x10 min    Electrical Stimulation Parameters Patient tolerance for intensity    Electrical Stimulation Goals Pain;Tone                  PT Education - 04/01/20 1544    Education Details HEP    Person(s) Educated Patient    Methods Explanation    Comprehension Verbalized understanding;Need further instruction            PT Short Term Goals - 04/01/20 1551      PT SHORT TERM GOAL #1   Title Patient will be I with initial HEP to progress with PT    Baseline Patient continues to report inconsistency with HEP    Time 4    Period Weeks    Status On-going    Target Date 04/29/20      PT SHORT TERM GOAL #2   Title Patient will report improved standing ability >/= 20 minutes to improve ability to perform tasks around house and in yard    Baseline Patient reports continued limitation with standing, some days he can stand 20-30 minutes, other days 5-10 minutes    Time 4    Period Weeks    Status On-going    Target Date 04/29/20      PT SHORT TERM GOAL #3   Title Patient will be able to place a light object (</= 3 lbs) into shelf at shoulder height with only min difficulty    Time 4    Period Weeks    Status Achieved    Target  Date 04/06/20      PT SHORT TERM GOAL #4   Title Patient will report no difficulty getting in/out of a chair to improve transfer ability    Time 4    Period Weeks    Status Achieved    Target Date 03/09/20             PT Long Term Goals - 04/01/20 1553      PT LONG TERM GOAL #1   Title Patient will be I with final HEP to maintain progress from PT    Time 8    Period Weeks    Status On-going    Target Date 05/27/20      PT LONG TERM GOAL #2   Title Patient will report improved functional level of shoulder to </= 38% limitation, lumbar to </= 52% limitation    Time 8  Period Weeks    Status On-going    Target Date 05/27/20      PT LONG TERM GOAL #3   Title Patient will exhibit lumbar improvement of >/= 50% in all directions to improve ability to dress and perform self care without limitation    Time 8    Period Weeks    Status On-going    Target Date 05/27/20      PT LONG TERM GOAL #4   Title Patient will report no limitation with performing activities around home or with standing tasks    Time 8    Period Weeks    Status On-going    Target Date 05/27/20      PT LONG TERM GOAL #5   Title Patient will exhibit improved right shoulder strength grossly >/= 4+/5 MMT to be able to lift gallon of milk back into fridge without assist from other arm    Time 8    Period Weeks    Status On-going    Target Date 05/27/20                 Plan - 04/01/20 1547    Clinical Impression Statement Patient tolerated therapy well with no adverse effects. Patient reports continued fatigue and blurred vision from episode on Tuesday. Overall he is doing better and exhibits same motion and strength testing as last visit. He does continue to report limitation with mobility and walking endurance due to back tightening up. He would benefit from continued skilled PT to improve his ability to stand and walk longer periods of time to be able to perform household tasks and activity without  limitation    Personal Factors and Comorbidities Age;Fitness;Past/Current Experience;Time since onset of injury/illness/exacerbation;Comorbidity 3+    Comorbidities Parkinson disease, depression/anxiety, hx of CABG x5    Examination-Activity Limitations Locomotion Level;Reach Overhead;Stand;Lift;Dressing;Carry;Bend    Examination-Participation Restrictions Meal Prep;Cleaning;Community Activity;Shop;Yard Work;Laundry    PT Frequency 1x / week    PT Duration 8 weeks    PT Treatment/Interventions ADLs/Self Care Home Management;Cryotherapy;Electrical Stimulation;Moist Heat;Iontophoresis 76m/ml Dexamethasone;Traction;Ultrasound;Neuromuscular re-education;Balance training;Therapeutic exercise;Therapeutic activities;Functional mobility training;Stair training;Gait training;Patient/family education;Manual techniques;Dry needling;Passive range of motion;Taping;Spinal Manipulations;Joint Manipulations    PT Next Visit Plan Assess HEP and progress PRN, assess response to e-stim, manual/stretching for hip and lumbar mobility, progress core/hip and postural control, light rotator cuff strengthening as able    PT Home Exercise Plan [email or web address]    Consulted and Agree with Plan of Care Patient           Patient will benefit from skilled therapeutic intervention in order to improve the following deficits and impairments:  Decreased range of motion, Difficulty walking, Abnormal gait, Decreased activity tolerance, Pain, Postural dysfunction, Decreased strength  Visit Diagnosis: Chronic bilateral low back pain, unspecified whether sciatica present  Chronic right shoulder pain  Stiffness of right shoulder, not elsewhere classified  Muscle weakness (generalized)     Problem List Patient Active Problem List   Diagnosis Date Noted  . Fatigue 01/15/2019  . Chronic systolic (congestive) heart failure ([place] 09/18/2018  . Ischemic cardiomyopathy 09/16/2018  . Aortic regurgitation 09/16/2018  . Cardiomyopathy,  unspecified ([place] 06/13/2018  . Abscess of right axilla 12/12/2017  . BMI 33.0-33.9,adult 12/12/2017  . 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  . Acute blood loss anemia 10/25/2017  . Acute postoperative respiratory insufficiency 10/25/2017  . Postoperative delirium 10/25/2017  . Dyslipidemia 10/17/2017  . SOB (shortness of breath) 03/31/2017  . Long term current use of aspirin 03/29/2017  .  Parkinson's disease ([place]) 01/02/2017  . Morbid (severe) obesity due to excess calories ([place]) 10/27/2015  . Memory change 07/06/2015  . Depression, recurrent ([place]) 07/06/2015  . Medial meniscus tear 10/11/2011  . Neuroma of foot 10/11/2011  . Coronary artery disease involving native coronary artery of native heart with angina pectoris ([place]) 12/28/2010  . 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 Mixed hyperlipidemia 05/20/2010  . Hypertensive heart disease with heart failure ([place]) 05/20/2010  . 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 GERD 05/20/2010  . TRANSIENT ISCHEMIC ATTACK, HX OF 05/20/2010  . NEPHROLITHIASIS, HX OF 05/20/2010  . BENIGN PROSTATIC HYPERTROPHY, HX OF, S/P TURP 05/20/2010    Hilda Blades, PT, DPT, LAT, ATC 04/01/20  4:16 PM Phone: [phone] Fax: [facility] [address] [place], [place], [address] Phone: [phone]   Fax:  [phone]  Name: Shawn Meza MRN: [id number] Date of Birth: [birth year]   PHYSICAL THERAPY DISCHARGE SUMMARY  Visits from Start of Care: 11  Current functional level related to goals / functional outcomes: See above   Remaining deficits: See above   Education / Equipment: HEP Plan:                                                    Patient goals were partially met. Patient is being discharged due to not returning since the last visit.  ?????    Hilda Blades, PT, DPT, LAT, ATC 07/13/20  3:05 PM Phone: [phone] Fax:  [phone]

## 2020-04-02 ENCOUNTER — Telehealth (HOSPITAL_COMMUNITY): Payer: Self-pay

## 2020-04-08 ENCOUNTER — Other Ambulatory Visit: Payer: Self-pay | Admitting: Cardiology

## 2020-04-08 ENCOUNTER — Telehealth: Payer: Self-pay | Admitting: Family Medicine

## 2020-04-08 NOTE — Telephone Encounter (Signed)
Called and he wants counseling.  The last referral they did call him but he wanted an in person appt, but they were going to schedule him virtual.  They were to call him back to schedule in person but he has not heard back from them. I have given him the phone number to [place] to call to see if he cannot get this scheduled.

## 2020-04-08 NOTE — Telephone Encounter (Signed)
Caller Shawn Meza  Call Back # [phone]  Patient is calling to request a referral to behavorial health.

## 2020-04-08 NOTE — Telephone Encounter (Signed)
We can do, does he want counseling services or psychiatry, a specialist? Ty.

## 2020-04-13 ENCOUNTER — Ambulatory Visit: Payer: Medicare Other | Admitting: Family Medicine

## 2020-04-18 ENCOUNTER — Other Ambulatory Visit: Payer: Self-pay | Admitting: Cardiology

## 2020-04-21 ENCOUNTER — Other Ambulatory Visit: Payer: Self-pay

## 2020-04-21 DIAGNOSIS — I2510 Atherosclerotic heart disease of native coronary artery without angina pectoris: Secondary | ICD-10-CM

## 2020-04-21 MED ORDER — CLOPIDOGREL BISULFATE 75 MG PO TABS: ORAL_TABLET | ORAL | 0 refills | Status: DC

## 2020-04-21 NOTE — Telephone Encounter (Signed)
Refill sent to [facility] for Clopidogrel.

## 2020-05-13 ENCOUNTER — Other Ambulatory Visit: Payer: Self-pay | Admitting: Cardiology

## 2020-05-13 NOTE — Telephone Encounter (Signed)
Refill sent to pharmacy.   

## 2020-05-20 ENCOUNTER — Other Ambulatory Visit: Payer: Self-pay | Admitting: Cardiology

## 2020-05-20 NOTE — Telephone Encounter (Signed)
Rx refill sent to pharmacy. 

## 2020-06-01 ENCOUNTER — Encounter: Payer: Self-pay | Admitting: Family Medicine

## 2020-06-01 ENCOUNTER — Other Ambulatory Visit: Payer: Self-pay

## 2020-06-01 ENCOUNTER — Telehealth (INDEPENDENT_AMBULATORY_CARE_PROVIDER_SITE_OTHER): Payer: Medicare Other | Admitting: Family Medicine

## 2020-06-01 ENCOUNTER — Institutional Professional Consult (permissible substitution): Payer: Medicare Other | Admitting: Pulmonary Disease

## 2020-06-01 VITALS — Temp 98.6°F

## 2020-06-01 DIAGNOSIS — J209 Acute bronchitis, unspecified: Secondary | ICD-10-CM

## 2020-06-01 MED ORDER — BENZONATATE 100 MG PO CAPS: 100.0000 mg | ORAL_CAPSULE | Freq: Three times a day (TID) | ORAL | 0 refills | Status: DC | PRN

## 2020-06-01 MED ORDER — PREDNISONE 20 MG PO TABS: 40.0000 mg | ORAL_TABLET | Freq: Every day | ORAL | 0 refills | Status: AC

## 2020-06-01 NOTE — Progress Notes (Signed)
Chief Complaint  Patient presents with  . Cough  . Nasal Congestion    Shawn Meza here for URI complaints. Due to COVID-19 pandemic, we are interacting via telephone. I verified patient's ID using 2 identifiers. Patient agreed to proceed with visit via this method. Patient is at home, I am at office. Patient and I are present for visit.   Duration: 1 week  Associated symptoms: itchy watery eyes, sore throat, chest tightness and cough Denies: sinus congestion, sinus pain, rhinorrhea, ear pain, ear drainage, wheezing, shortness of breath, myalgia and fevers Treatment to date: Xyzal, cough syrup, liquor, vitamins Sick contacts: No  Past Medical History:  Diagnosis Date  . Arthritis   . BPH (benign prostatic hypertrophy)   . Chronic coronary artery disease   . Colon cancer ([place])   . Elevated PSA   . Erectile dysfunction   . Essential hypertension   . Essential tremor   . GERD (gastroesophageal reflux disease)   . Headache(784.0)   . Hiatal hernia   . Hypercholesterolemia   . Long-term use of aspirin therapy   . Major depression, chronic   . Medial meniscus tear 10/11/2011  . Metabolic syndrome   . Morbid obesity ([place])   . Nephrolithiasis    hx of  . NSTEMI (non-ST elevated myocardial infarction) ([place])   . Parkinson's disease ([place])   . S/P CABG (coronary artery bypass graft)   . Transient ischemic attack    hx of  . Trochanteric bursitis of right hip     Temp 98.6 F (37 C) (Oral)  No conversational dyspnea Age appropriate judgment and insight Nml affect and mood  Acute bronchitis, unspecified organism - Plan: predniSONE (DELTASONE) 20 MG tablet, benzonatate (TESSALON) 100 MG capsule  Cough medicine prn. Pred if no better in 2 d, he is starting to turn the corner so doubt he will need this. Doubt bacterial infection also.  Continue to push fluids, practice good hand hygiene, cover mouth when coughing.  Total time: 11 min F/u prn. If starting to experience fevers,  shaking, or shortness of breath, seek immediate care. Pt voiced understanding and agreement to the plan.  [place], DO 06/01/20 12:48 PM

## 2020-06-11 DIAGNOSIS — H16223 Keratoconjunctivitis sicca, not specified as Sjogren's, bilateral: Secondary | ICD-10-CM | POA: Diagnosis not present

## 2020-06-17 ENCOUNTER — Other Ambulatory Visit: Payer: Self-pay | Admitting: Cardiology

## 2020-06-19 ENCOUNTER — Other Ambulatory Visit: Payer: Self-pay | Admitting: Neurology

## 2020-06-19 NOTE — Telephone Encounter (Signed)
Rx(s) sent to pharmacy electronically.  

## 2020-06-22 ENCOUNTER — Other Ambulatory Visit: Payer: Self-pay | Admitting: Cardiology

## 2020-06-23 NOTE — Telephone Encounter (Signed)
Needs appointment for future refills.

## 2020-07-01 ENCOUNTER — Telehealth (INDEPENDENT_AMBULATORY_CARE_PROVIDER_SITE_OTHER): Payer: Medicare Other | Admitting: Family Medicine

## 2020-07-01 ENCOUNTER — Other Ambulatory Visit: Payer: Self-pay

## 2020-07-01 DIAGNOSIS — J209 Acute bronchitis, unspecified: Secondary | ICD-10-CM

## 2020-07-01 DIAGNOSIS — J440 Chronic obstructive pulmonary disease with acute lower respiratory infection: Secondary | ICD-10-CM | POA: Diagnosis not present

## 2020-07-01 MED ORDER — DOXYCYCLINE HYCLATE 100 MG PO CAPS: 100.0000 mg | ORAL_CAPSULE | Freq: Two times a day (BID) | ORAL | 0 refills | Status: DC

## 2020-07-01 MED ORDER — HYDROCODONE-HOMATROPINE 5-1.5 MG/5ML PO SYRP: 2.5000 mL | ORAL_SOLUTION | Freq: Three times a day (TID) | ORAL | 0 refills | Status: DC | PRN

## 2020-07-01 NOTE — Progress Notes (Signed)
[place] [facility] [address] [place], [place] [address] [phone]  Date:  07/01/2020   Name:  Shawn Meza   DOB:  12-10-1945   MRN:  [id number]  PCP:  [name], DO    Chief Complaint: No chief complaint on file.   History of Present Illness:  Shawn Meza is a 74 y.o. very pleasant male patient who presents with the following:  Virtual visit today for gentleman who is a primary patient of Dr. Nani Meza Patient location is home, provider location is office.  Patient identity confirmed with 2 factors, he gives consent for virtual visit today.  The patient myself are present on the call today History of CABG, Parkinson's disease, TIA, hyperlipidemia, ischemic cardiomyopathy, hypertension  He had his primary Covid series-booster?  Most recent visit on 11/1-at that time patient had a virtual visit for illness as follows Acute bronchitis, unspecified organism - Plan: predniSONE (DELTASONE) 20 MG tablet, benzonatate (TESSALON) 100 MG capsule Cough medicine prn. Pred if no better in 2 d, he is starting to turn the corner so doubt he will need this. Doubt bacterial infection also.  Continue to push fluids, practice good hand hygiene, cover mouth when coughing.   Pt notes that his sx "came back 3 times, same thing.  I'm just so congested and coughing" He is coughing a lot- this is really bothersome to him.  The cough is bothering his hernia  No fever noted  He is coughing up some clear to yellow mucus No vomiting or diarrhea  No ST or earache  Patient Active Problem List   Diagnosis Date Noted  . Fatigue 01/15/2019  . Chronic systolic (congestive) heart failure ([place]) 09/18/2018  . Ischemic cardiomyopathy 09/16/2018  . Aortic regurgitation 09/16/2018  . Cardiomyopathy, unspecified ([place]) 06/13/2018  . Abscess of right axilla 12/12/2017  . BMI 33.0-33.9,adult 12/12/2017  . 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  . Acute blood loss anemia 10/25/2017  . Acute postoperative respiratory insufficiency 10/25/2017  . Postoperative delirium 10/25/2017  . Dyslipidemia 10/17/2017  . SOB (shortness of breath) 03/31/2017  . Long term current use of aspirin 03/29/2017  . Parkinson's disease ([place]) 01/02/2017  . Morbid (severe) obesity due to excess calories ([place]) 10/27/2015  . Memory change 07/06/2015  . Depression, recurrent ([place]) 07/06/2015  . Medial meniscus tear 10/11/2011  . Neuroma of foot 10/11/2011  . Coronary artery disease involving native coronary artery of native heart with angina pectoris ([place]) 12/28/2010  . 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 Mixed hyperlipidemia 05/20/2010  . Hypertensive heart disease with heart failure ([place]) 05/20/2010  . 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 GERD 05/20/2010  . TRANSIENT ISCHEMIC ATTACK, HX OF 05/20/2010  . NEPHROLITHIASIS, HX OF 05/20/2010  . BENIGN PROSTATIC HYPERTROPHY, HX OF, S/P TURP 05/20/2010    Past Medical History:  Diagnosis Date  . Arthritis   . BPH (benign prostatic hypertrophy)   . Chronic coronary artery disease   . Colon cancer ([place])   . Elevated PSA   . Erectile dysfunction   . Essential hypertension   . Essential tremor   . GERD (gastroesophageal reflux disease)   . Headache(784.0)   . Hiatal hernia   . Hypercholesterolemia   . Long-term use of aspirin therapy   . Major depression, chronic   . Medial meniscus tear 10/11/2011  . Metabolic syndrome   . Morbid obesity ([place])   .  Nephrolithiasis    hx of  . NSTEMI (non-ST elevated myocardial infarction) ([place])   . Parkinson's disease ([place])   . S/P CABG (coronary artery bypass graft)   . Transient ischemic attack    hx of  . Trochanteric bursitis of right hip     Past Surgical History:  Procedure Laterality Date  . CARDIAC CATHETERIZATION  5/12,1/13   4 stents placed  . COLON SURGERY    . CORONARY ARTERY BYPASS GRAFT    . KNEE  ARTHROSCOPY  10/11/2011   Procedure: ARTHROSCOPY KNEE;  Surgeon: Lorn Junes, MD;  Location: [place];  Service: Orthopedics;  Laterality: Left;  Left Knee Arthroscopy with Medial and Lateral Partial Menisectomy, Chondroplasty  . LEFT HEART CATHETERIZATION WITH CORONARY ANGIOGRAM N/A 08/25/2011   Procedure: LEFT HEART CATHETERIZATION WITH CORONARY ANGIOGRAM;  Surgeon: Burnell Blanks, MD;  Location: [facility] CATH LAB;  Service: Cardiovascular;  Laterality: N/A;  . LITHOTRIPSY    . STERIOD INJECTION  10/11/2011   Procedure: STEROID INJECTION;  Surgeon: Lorn Junes, MD;  Location: [place];  Service: Orthopedics;  Laterality: Right;  Steroid Injection Second Toe  . TRANSURETHRAL RESECTION OF PROSTATE    . URETHRAL DILATION      Social History   Tobacco Use  . Smoking status: Never Smoker  . Smokeless tobacco: Never Used  Vaping Use  . Vaping Use: Never used  Substance Use Topics  . Alcohol use: No  . Drug use: No    Family History  Problem Relation Age of Onset  . Heart disease Mother   . Heart disease Father   . Hyperlipidemia Father   . Stroke Father   . Hyperlipidemia Brother   . Heart disease Brother   . Coronary artery disease Other        family hx of male 1st degree relative ,76  . Hyperlipidemia Other        family hx of  . Hypertension Other        family hx of  . Arthritis Other        family hx of  . Healthy Daughter   . Dementia Neg Hx     Allergies  Allergen Reactions  . Testosterone Other (See Comments)    ABDOMINAL PAIN and cramping  . Fluoxetine Other (See Comments)    Caused depression and aggression  . Requip [Ropinirole] Nausea Only    Medication list has been reviewed and updated.  Current Outpatient Medications on File Prior to Visit  Medication Sig Dispense Refill  . aspirin 81 MG tablet Take 81 mg by mouth at bedtime.     . benzonatate (TESSALON) 100 MG capsule Take 1 capsule (100 mg total) by mouth  3 (three) times daily as needed. 30 capsule 0  . Carbidopa-Levodopa ER (SINEMET CR) 25-100 MG tablet controlled release Take 1 tablet by mouth in the morning, at noon, and at bedtime. 270 tablet 1  . Cholecalciferol (VITAMIN D-3 PO) Take 1 capsule by mouth daily with breakfast.    . clopidogrel (PLAVIX) 75 MG tablet TAKE 1 TABLET BY MOUTH ONCE DAILY. 30 tablet 0  . ergocalciferol (VITAMIN D2) 1.25 MG (50000 UT) capsule Take 1 capsule (50,000 Units total) by mouth once a week. (Patient taking differently: Take 50,000 Units by mouth every Friday. ) 12 capsule 0  . esomeprazole (NEXIUM) 40 MG capsule Take 40 mg by mouth at bedtime.     . ferrous sulfate (SLOW IRON) 160 (50 Fe)  MG TBCR SR tablet Take 160 mg by mouth every other day.    . fluocinonide-emollient (LIDEX-E) 0.05 % cream Apply 1 application topically 2 (two) times daily. (Patient taking differently: Apply 1 application topically 2 (two) times daily as needed (to affected areas). ) 60 g 2  . furosemide (LASIX) 40 MG tablet Take 1 tablet (40 mg total) by mouth daily. PLEASE CALL OFFICE TO SCHEDULE FOLLOW UP APPOINTMENT (Patient taking differently: Take 40 mg by mouth daily. ) 30 tablet 0  . gabapentin (NEURONTIN) 100 MG capsule Take 1 capsule (100 mg total) by mouth at bedtime. 60 capsule 3  . levocetirizine (XYZAL) 5 MG tablet Take 1 tablet (5 mg total) by mouth every evening. 30 tablet 2  . metoprolol tartrate (LOPRESSOR) 25 MG tablet Take 1 tablet (25 mg total) by mouth 2 (two) times daily. NEEDS APPOINTMENT FOR FUTURE REFILLS / 2ND ATTEMPT 30 tablet 0  . Multiple Vitamin (MULTI-VITAMINS) TABS Take 1 tablet by mouth daily.     . nitroGLYCERIN (NITROSTAT) 0.4 MG SL tablet Place 1 tablet (0.4 mg total) under the tongue every 5 (five) minutes as needed. Chest pain (Patient taking differently: Place 0.4 mg under the tongue every 5 (five) minutes as needed for chest pain. ) 25 tablet 11  . pravastatin (PRAVACHOL) 20 MG tablet TAKE 1 TABLET (20 MG  TOTAL) BY MOUTH DAILY. 30 tablet 0  . prednisoLONE acetate (PRED FORTE) 1 % ophthalmic suspension Place 1 drop into both eyes 2 (two) times daily.    . RESTASIS 0.05 % ophthalmic emulsion Place 1 drop into both eyes 2 (two) times daily.    . sacubitril-valsartan (ENTRESTO) 24-26 MG Take 1 tablet by mouth 2 (two) times daily. NEEDS APPOINTMENT FOR FUTURE REFILL / 1ST ATTEMPT 60 tablet 0  . tamsulosin (FLOMAX) 0.4 MG CAPS capsule Take 0.4 mg by mouth daily as needed (to relax the prostate).   1  . TRINTELLIX 10 MG TABS tablet Take 10 mg by mouth daily.    Marland Kitchen vortioxetine HBr (TRINTELLIX) 5 MG TABS tablet Take 2 tablets (10 mg total) by mouth daily. 60 tablet 2   No current facility-administered medications on file prior to visit.    Review of Systems:  As per HPI- otherwise negative.   Physical Examination: There were no vitals filed for this visit. There were no vitals filed for this visit. There is no height or weight on file to calculate BMI. Ideal Body Weight:    Spoke with patient over telephone, he was not able to obtain video feed.  He sounds well, no distress but occasional cough  Assessment and Plan: Acute bronchitis with COPD ([place]) - Plan: HYDROcodone-homatropine (HYCODAN) 5-1.5 MG/5ML syrup, doxycycline (VIBRAMYCIN) 100 MG capsule  Virtual visit over telephone today for patient with recurrent cough-he first noticed cough about 1 month ago, resolved temporarily with steroids but then returned As he is been sick for a month now we will treat with a course of doxycycline.  He notes a severe cough which is irritating (?  [place]) hernia which she has as a result of CABG surgery.  I prescribed Hycodan at a conservative dose for him to use as needed for cough  He has not yet had his COVID-19 booster.  I encouraged him to be tested for COVID-19-if positive he should be treated with antibody.  Assuming he is negative, encouraged him to get a booster once he is feeling better  Spoke  with patient for 10 minutes today  Signed  Lamar Blinks, MD

## 2020-07-02 ENCOUNTER — Telehealth: Payer: Self-pay | Admitting: Family Medicine

## 2020-07-02 DIAGNOSIS — R410 Disorientation, unspecified: Secondary | ICD-10-CM

## 2020-07-02 NOTE — Telephone Encounter (Signed)
Called pt back- he is interested in monoclonal antibody infusion.  Called infusion center and left detailed message with pt info.  He is also concerned about difficulty managing and remembering his medications.  He would like to have a home health nurse come and assist him.  I will place a referral for home health eval once he is well

## 2020-07-02 NOTE — Telephone Encounter (Signed)
Patient calling back to let you know is covid test was positive. Patient was seeing by Dr. Lorelei Pont yesterday.

## 2020-07-03 ENCOUNTER — Other Ambulatory Visit (HOSPITAL_COMMUNITY): Payer: Self-pay | Admitting: Physician Assistant

## 2020-07-03 ENCOUNTER — Encounter: Payer: Self-pay | Admitting: Physician Assistant

## 2020-07-03 NOTE — Progress Notes (Signed)
I connected by phone with Shawn Meza on 07/03/2020 at 5:14 PM to discuss the potential use of a new treatment for mild to moderate COVID-19 viral infection in non-hospitalized patients.  This patient is a 74 y.o. male that meets the FDA criteria for Emergency Use Authorization of COVID monoclonal antibody casirivimab/imdevimab, bamlanivimab/eteseviamb, or sotrovimab.  Has a (+) direct SARS-CoV-2 viral test result  Has mild or moderate COVID-19   Is NOT hospitalized due to COVID-19  Is within 10 days of symptom onset  Has at least one of the high risk factor(s) for progression to severe COVID-19 and/or hospitalization as defined in EUA.  Specific high risk criteria : Older age (>/= 76 yo), BMI > 25 and Cardiovascular disease or hypertension   I have spoken and communicated the following to the patient or parent/caregiver regarding COVID monoclonal antibody treatment:  1. FDA has authorized the emergency use for the treatment of mild to moderate COVID-19 in adults and pediatric patients with positive results of direct SARS-CoV-2 viral testing who are 44 years of age and older weighing at least 40 kg, and who are at high risk for progressing to severe COVID-19 and/or hospitalization.  2. The significant known and potential risks and benefits of COVID monoclonal antibody, and the extent to which such potential risks and benefits are unknown.  3. Information on available alternative treatments and the risks and benefits of those alternatives, including clinical trials.  4. Patients treated with COVID monoclonal antibody should continue to self-isolate and use infection control measures (e.g., wear mask, isolate, social distance, avoid sharing personal items, clean and disinfect "high touch" surfaces, and frequent handwashing) according to CDC guidelines.   5. The patient or parent/caregiver has the option to accept or refuse COVID monoclonal antibody treatment.  After reviewing this  information with the patient, the patient has agreed to receive one of the available covid 19 monoclonal antibodies and will be provided an appropriate fact sheet prior to infusion. Konrad Felix, PA-C 07/03/2020 5:14 PM

## 2020-07-04 ENCOUNTER — Telehealth (HOSPITAL_COMMUNITY): Payer: Self-pay | Admitting: Family

## 2020-07-04 DIAGNOSIS — R69 Illness, unspecified: Secondary | ICD-10-CM

## 2020-07-04 NOTE — Telephone Encounter (Signed)
Called to discuss with [name] about Covid symptoms and potential candidacy for the use of sotrovimab, a combination monoclonal antibody infusion for those with mild to moderate Covid symptoms and at a high risk of hospitalization.     Pt is qualified for this infusion at the infusion center due to co-morbid conditions and/or a member of an at-risk group, however unable to reach patient. VM left.   Jayon Matton,NP

## 2020-07-06 ENCOUNTER — Ambulatory Visit (HOSPITAL_COMMUNITY)
Admission: RE | Admit: 2020-07-06 | Discharge: 2020-07-06 | Disposition: A | Discharge status: Home / Self Care | Payer: Medicare Other | Source: Ambulatory Visit | Attending: Pulmonary Disease | Admitting: Pulmonary Disease

## 2020-07-06 DIAGNOSIS — Z23 Encounter for immunization: Secondary | ICD-10-CM | POA: Insufficient documentation

## 2020-07-06 DIAGNOSIS — U071 COVID-19: Secondary | ICD-10-CM | POA: Diagnosis present

## 2020-07-06 MED ORDER — METHYLPREDNISOLONE SODIUM SUCC 125 MG IJ SOLR: 125.0000 mg | Freq: Once | INTRAMUSCULAR | Status: DC | PRN

## 2020-07-06 MED ORDER — DIPHENHYDRAMINE HCL 50 MG/ML IJ SOLN: 50.0000 mg | Freq: Once | INTRAMUSCULAR | Status: DC | PRN

## 2020-07-06 MED ORDER — ALBUTEROL SULFATE HFA 108 (90 BASE) MCG/ACT IN AERS: 2.0000 | INHALATION_SPRAY | Freq: Once | RESPIRATORY_TRACT | Status: DC | PRN

## 2020-07-06 MED ORDER — EPINEPHRINE 0.3 MG/0.3ML IJ SOAJ: 0.3000 mg | Freq: Once | INTRAMUSCULAR | Status: DC | PRN

## 2020-07-06 MED ORDER — FAMOTIDINE IN NACL 20-0.9 MG/50ML-% IV SOLN: 20.0000 mg | Freq: Once | INTRAVENOUS | Status: DC | PRN

## 2020-07-06 MED ORDER — SODIUM CHLORIDE 0.9 % IV SOLN: INTRAVENOUS | Status: DC | PRN

## 2020-07-06 MED ORDER — SODIUM CHLORIDE 0.9 % IV SOLN: Freq: Once | INTRAVENOUS | Status: AC

## 2020-07-06 NOTE — Progress Notes (Signed)
Patient reviewed Fact Sheet for Patients, Parents, and Caregivers for Emergency Use Authorization (EUA) of REGEN-COV for the Treatment of Coronavirus. Patient also reviewed and is agreeable to the estimated cost of treatment. Patient is agreeable to proceed.    

## 2020-07-06 NOTE — Progress Notes (Signed)
  Diagnosis: COVID-19  Physician: Dr. Joya Gaskins  Procedure: [facility] Med: casirivimab\imdevimab infusion - Provided patient with casirivimab\imdevimab fact sheet for patients, parents and caregivers prior to infusion.  Complications: No immediate complications noted.  Discharge: Discharged home   Hulan Fess 07/06/2020

## 2020-07-06 NOTE — Discharge Instructions (Signed)
10 Things You Can Do to Manage Your COVID-19 Symptoms at Home If you have possible or confirmed COVID-19: 1. Stay home from work and school. And stay away from other public places. If you must go out, avoid using any kind of public transportation, ridesharing, or taxis. 2. Monitor your symptoms carefully. If your symptoms get worse, call your healthcare provider immediately. 3. Get rest and stay hydrated. 4. If you have a medical appointment, call the healthcare provider ahead of time and tell them that you have or may have COVID-19. 5. For medical emergencies, call 911 and notify the dispatch personnel that you have or may have COVID-19. 6. Cover your cough and sneezes with a tissue or use the inside of your elbow. 7. Wash your hands often with soap and water for at least 20 seconds or clean your hands with an alcohol-based hand sanitizer that contains at least 60% alcohol. 8. As much as possible, stay in a specific room and away from other people in your home. Also, you should use a separate bathroom, if available. If you need to be around other people in or outside of the home, wear a mask. 9. Avoid sharing personal items with other people in your household, like dishes, towels, and bedding. 10. Clean all surfaces that are touched often, like counters, tabletops, and doorknobs. Use household cleaning sprays or wipes according to the label instructions. cdc.gov/coronavirus 01/30/2019 This information is not intended to replace advice given to you by your health care provider. Make sure you discuss any questions you have with your health care provider. Document Revised: 07/04/2019 Document Reviewed: 07/04/2019 Elsevier Patient Education  2020 Elsevier Inc. What types of side effects do monoclonal antibody drugs cause?  Common side effects  In general, the more common side effects caused by monoclonal antibody drugs include: . Allergic reactions, such as hives or itching . Flu-like signs and  symptoms, including chills, fatigue, fever, and muscle aches and pains . Nausea, vomiting . Diarrhea . Skin rashes . Low blood pressure   The CDC is recommending patients who receive monoclonal antibody treatments wait at least 90 days before being vaccinated.  Currently, there are no data on the safety and efficacy of mRNA COVID-19 vaccines in persons who received monoclonal antibodies or convalescent plasma as part of COVID-19 treatment. Based on the estimated half-life of such therapies as well as evidence suggesting that reinfection is uncommon in the 90 days after initial infection, vaccination should be deferred for at least 90 days, as a precautionary measure until additional information becomes available, to avoid interference of the antibody treatment with vaccine-induced immune responses. If you have any questions or concerns after the infusion please call the Advanced Practice Provider on call at [phone]. This number is ONLY intended for your use regarding questions or concerns about the infusion post-treatment side-effects.  Please do not provide this number to others for use. For return to work notes please contact your primary care provider.   If someone you know is interested in receiving treatment please have them call the COVID hotline at [phone].   

## 2020-07-07 ENCOUNTER — Telehealth: Payer: Self-pay | Admitting: Nurse Practitioner

## 2020-07-07 DIAGNOSIS — R0602 Shortness of breath: Secondary | ICD-10-CM | POA: Diagnosis not present

## 2020-07-07 DIAGNOSIS — U071 COVID-19: Secondary | ICD-10-CM | POA: Diagnosis not present

## 2020-07-07 DIAGNOSIS — R069 Unspecified abnormalities of breathing: Secondary | ICD-10-CM | POA: Diagnosis not present

## 2020-07-07 DIAGNOSIS — R0789 Other chest pain: Secondary | ICD-10-CM | POA: Diagnosis not present

## 2020-07-07 DIAGNOSIS — R079 Chest pain, unspecified: Secondary | ICD-10-CM | POA: Diagnosis not present

## 2020-07-07 NOTE — Telephone Encounter (Signed)
Patient called [facility] OnCall Provider:  Patient received mAb yesterday at the [facility] and denies any adverse events during or immediately after the infusion.   He does report that last night he developed nausea and vomiting and became anxious so he called EMS to evaluate him. He tells me that his blood pressure and oxygen saturations were normal and the EKG they performed was normal. They did not transport him to the hospital for further evaluation.    He tells me this morning he is still feeling bad, but has not had any more episodes of nausea or vomiting. He tells me that he feels weak, but not more weak than he did prior to infusion. He also tells me that he feels very anxious about his health and that he was hoping to feel much better when he awoke this morning.   At this time he denies chest pain, shortness of breath, dizziness, difficulty breathing, nausea, vomiting, fevers, chills, weakness on one side, difficulty speaking or swallowing, or impending doom.   He does endorse fatigue, generalized weakness, mild cough, and anxiety.   He tells me that he has anxiety at baseline and he lives alone, which he feels is contributing to his increased anxiety at this time. He would like reassurance that his symptoms are expected.   Of note, he is unsure if he took his regular medication last night and he has not taken his daily medication today. He tells me he does have ondansetron at home and PRN medication for anxiety.   PLAN: Discussed with patient that nausea and vomiting can be a side effect of both COVID-19 viral infection as well as mAb treatment for the condition. Recommend adequate hydration and BRAT diet as tolerated.  Discussed that he can take ondansetron up to every 8 hours for nausea if symptoms return.   Recommend rest and hydration today and to allow body to heal.   Discussed that while the mAb infusion can help people to feel better faster, the ultimate goal  is to prevent hospitalization and severe illness in high risk patients. Given the length of time his symptoms were present at the time of infusion, it may take several days for him to begin to feel better.   Discussed warning signs that would warrant immediate evaluation including chest pain, shortness of breath, dizziness, weakness on one side, difficulty speaking or walking, or inability to keep liquids down.   Reassured patient that anxiety related to health is common in persons with anxiety disorder and COVID-19 infection. If his symptoms are persistent and he feels that he is in need of PRN anxiety medication, recommend that he consider taking this to help with symptoms or reach out to PCP for recommendations.   Discussed the importance of staying on regular daily medications, as abruptly stopping some of these medications can result in side effects.  Patient expressed understanding and teach back provided for emergency symptoms that would warrant immediate evaluation. Encourage patient to reach out if he has any further questions or concerns.   Worthy Keeler, DNP, AGNP-c COVID-19 [facility] [phone]- hotline

## 2020-07-08 ENCOUNTER — Other Ambulatory Visit: Payer: Self-pay | Admitting: Cardiology

## 2020-07-08 DIAGNOSIS — I2584 Coronary atherosclerosis due to calcified coronary lesion: Secondary | ICD-10-CM

## 2020-07-08 DIAGNOSIS — I2510 Atherosclerotic heart disease of native coronary artery without angina pectoris: Secondary | ICD-10-CM

## 2020-07-08 NOTE — Telephone Encounter (Signed)
Plavix 75 mg # 15 tablets only sent to [facility]. Patient needs follow up appointment for future refills. Message sent to patient and pharmacy with refill

## 2020-07-17 DIAGNOSIS — Z7982 Long term (current) use of aspirin: Secondary | ICD-10-CM | POA: Diagnosis not present

## 2020-07-17 DIAGNOSIS — I5022 Chronic systolic (congestive) heart failure: Secondary | ICD-10-CM | POA: Diagnosis not present

## 2020-07-17 DIAGNOSIS — Z7902 Long term (current) use of antithrombotics/antiplatelets: Secondary | ICD-10-CM | POA: Diagnosis not present

## 2020-07-17 DIAGNOSIS — E7800 Pure hypercholesterolemia, unspecified: Secondary | ICD-10-CM | POA: Diagnosis not present

## 2020-07-17 DIAGNOSIS — Z9181 History of falling: Secondary | ICD-10-CM | POA: Diagnosis not present

## 2020-07-17 DIAGNOSIS — I110 Hypertensive heart disease with heart failure: Secondary | ICD-10-CM | POA: Diagnosis not present

## 2020-07-17 DIAGNOSIS — Z87442 Personal history of urinary calculi: Secondary | ICD-10-CM | POA: Diagnosis not present

## 2020-07-17 DIAGNOSIS — J3089 Other allergic rhinitis: Secondary | ICD-10-CM | POA: Diagnosis not present

## 2020-07-17 DIAGNOSIS — D3613 Benign neoplasm of peripheral nerves and autonomic nervous system of lower limb, including hip: Secondary | ICD-10-CM | POA: Diagnosis not present

## 2020-07-17 DIAGNOSIS — G20 Parkinson's disease: Secondary | ICD-10-CM | POA: Diagnosis not present

## 2020-07-17 DIAGNOSIS — Z87448 Personal history of other diseases of urinary system: Secondary | ICD-10-CM | POA: Diagnosis not present

## 2020-07-17 DIAGNOSIS — I351 Nonrheumatic aortic (valve) insufficiency: Secondary | ICD-10-CM | POA: Diagnosis not present

## 2020-07-17 DIAGNOSIS — I429 Cardiomyopathy, unspecified: Secondary | ICD-10-CM | POA: Diagnosis not present

## 2020-07-17 DIAGNOSIS — K219 Gastro-esophageal reflux disease without esophagitis: Secondary | ICD-10-CM | POA: Diagnosis not present

## 2020-07-17 DIAGNOSIS — M1990 Unspecified osteoarthritis, unspecified site: Secondary | ICD-10-CM | POA: Diagnosis not present

## 2020-07-17 DIAGNOSIS — I25119 Atherosclerotic heart disease of native coronary artery with unspecified angina pectoris: Secondary | ICD-10-CM | POA: Diagnosis not present

## 2020-07-17 DIAGNOSIS — Z951 Presence of aortocoronary bypass graft: Secondary | ICD-10-CM | POA: Diagnosis not present

## 2020-07-17 DIAGNOSIS — J209 Acute bronchitis, unspecified: Secondary | ICD-10-CM | POA: Diagnosis not present

## 2020-07-17 DIAGNOSIS — I252 Old myocardial infarction: Secondary | ICD-10-CM | POA: Diagnosis not present

## 2020-07-17 DIAGNOSIS — J449 Chronic obstructive pulmonary disease, unspecified: Secondary | ICD-10-CM | POA: Diagnosis not present

## 2020-07-17 DIAGNOSIS — Z85038 Personal history of other malignant neoplasm of large intestine: Secondary | ICD-10-CM | POA: Diagnosis not present

## 2020-07-24 DIAGNOSIS — K219 Gastro-esophageal reflux disease without esophagitis: Secondary | ICD-10-CM | POA: Diagnosis not present

## 2020-07-24 DIAGNOSIS — Z87448 Personal history of other diseases of urinary system: Secondary | ICD-10-CM | POA: Diagnosis not present

## 2020-07-24 DIAGNOSIS — M1990 Unspecified osteoarthritis, unspecified site: Secondary | ICD-10-CM | POA: Diagnosis not present

## 2020-07-24 DIAGNOSIS — G20 Parkinson's disease: Secondary | ICD-10-CM | POA: Diagnosis not present

## 2020-07-24 DIAGNOSIS — I429 Cardiomyopathy, unspecified: Secondary | ICD-10-CM | POA: Diagnosis not present

## 2020-07-24 DIAGNOSIS — I351 Nonrheumatic aortic (valve) insufficiency: Secondary | ICD-10-CM | POA: Diagnosis not present

## 2020-07-24 DIAGNOSIS — I25119 Atherosclerotic heart disease of native coronary artery with unspecified angina pectoris: Secondary | ICD-10-CM | POA: Diagnosis not present

## 2020-07-24 DIAGNOSIS — Z7902 Long term (current) use of antithrombotics/antiplatelets: Secondary | ICD-10-CM | POA: Diagnosis not present

## 2020-07-24 DIAGNOSIS — Z7982 Long term (current) use of aspirin: Secondary | ICD-10-CM | POA: Diagnosis not present

## 2020-07-24 DIAGNOSIS — Z951 Presence of aortocoronary bypass graft: Secondary | ICD-10-CM | POA: Diagnosis not present

## 2020-07-24 DIAGNOSIS — J449 Chronic obstructive pulmonary disease, unspecified: Secondary | ICD-10-CM | POA: Diagnosis not present

## 2020-07-24 DIAGNOSIS — E7800 Pure hypercholesterolemia, unspecified: Secondary | ICD-10-CM | POA: Diagnosis not present

## 2020-07-24 DIAGNOSIS — J3089 Other allergic rhinitis: Secondary | ICD-10-CM | POA: Diagnosis not present

## 2020-07-24 DIAGNOSIS — I252 Old myocardial infarction: Secondary | ICD-10-CM | POA: Diagnosis not present

## 2020-07-24 DIAGNOSIS — Z85038 Personal history of other malignant neoplasm of large intestine: Secondary | ICD-10-CM | POA: Diagnosis not present

## 2020-07-24 DIAGNOSIS — Z87442 Personal history of urinary calculi: Secondary | ICD-10-CM | POA: Diagnosis not present

## 2020-07-24 DIAGNOSIS — I110 Hypertensive heart disease with heart failure: Secondary | ICD-10-CM | POA: Diagnosis not present

## 2020-07-24 DIAGNOSIS — J209 Acute bronchitis, unspecified: Secondary | ICD-10-CM | POA: Diagnosis not present

## 2020-07-24 DIAGNOSIS — Z9181 History of falling: Secondary | ICD-10-CM | POA: Diagnosis not present

## 2020-07-24 DIAGNOSIS — D3613 Benign neoplasm of peripheral nerves and autonomic nervous system of lower limb, including hip: Secondary | ICD-10-CM | POA: Diagnosis not present

## 2020-07-24 DIAGNOSIS — I5022 Chronic systolic (congestive) heart failure: Secondary | ICD-10-CM | POA: Diagnosis not present

## 2020-07-28 ENCOUNTER — Ambulatory Visit (INDEPENDENT_AMBULATORY_CARE_PROVIDER_SITE_OTHER): Payer: Medicare Other | Admitting: Family Medicine

## 2020-07-28 ENCOUNTER — Encounter: Payer: Self-pay | Admitting: Family Medicine

## 2020-07-28 ENCOUNTER — Other Ambulatory Visit: Payer: Self-pay

## 2020-07-28 VITALS — BP 130/80 | HR 79 | Temp 98.6°F | Ht 66.0 in | Wt 198.0 lb

## 2020-07-28 DIAGNOSIS — E559 Vitamin D deficiency, unspecified: Secondary | ICD-10-CM

## 2020-07-28 DIAGNOSIS — F339 Major depressive disorder, recurrent, unspecified: Secondary | ICD-10-CM | POA: Diagnosis not present

## 2020-07-28 DIAGNOSIS — R058 Other specified cough: Secondary | ICD-10-CM | POA: Diagnosis not present

## 2020-07-28 MED ORDER — VORTIOXETINE HBR 5 MG PO TABS: 5.0000 mg | ORAL_TABLET | Freq: Every day | ORAL | 2 refills | Status: DC

## 2020-07-28 MED ORDER — FLUTICASONE PROPIONATE HFA 110 MCG/ACT IN AERO: 2.0000 | INHALATION_SPRAY | Freq: Two times a day (BID) | RESPIRATORY_TRACT | 12 refills | Status: DC

## 2020-07-28 NOTE — Patient Instructions (Addendum)
Give [place] 2-3 business days to get the results of your labs back.   Keep the diet clean and stay active.  Use the inhaler daily. Rinse mouth out after use.  Continue the syrup. This can last 6-8 weeks after the virus is cleared from your body.   Let us know if you need anything.

## 2020-07-28 NOTE — Progress Notes (Signed)
Chief Complaint  Patient presents with  . Follow-up    Subjective Shawn Meza presents for f/u anxiety/depression.  Pt is currently being treated with Trintellix 5 mg/d.  Reports doing well since treatment. No thoughts of harming self or others. No self-medication with alcohol, prescription drugs or illicit drugs. Pt is not following with a counselor/psychologist.  Patient has a history of low vitamin D.  He was on weekly prescription supplementation over the summer.  He never had a recheck.  He is taking 1000 units of vitamin D3 daily currently.  The patient had COVID around 1 month ago.  He has been having a lingering cough in addition to shortness of breath since then.  He has been using a cough syrup with some relief.  He is not on any inhalers.  He denies any wheezing.   Past Medical History:  Diagnosis Date  . Arthritis   . BPH (benign prostatic hypertrophy)   . Chronic coronary artery disease   . Colon cancer (HCC)   . Elevated PSA   . Erectile dysfunction   . Essential hypertension   . Essential tremor   . GERD (gastroesophageal reflux disease)   . Headache(784.0)   . Hiatal hernia   . Hypercholesterolemia   . Long-term use of aspirin therapy   . Major depression, chronic   . Medial meniscus tear 10/11/2011  . Metabolic syndrome   . Morbid obesity (HCC)   . Nephrolithiasis    hx of  . NSTEMI (non-ST elevated myocardial infarction) (HCC)   . Parkinson's disease (HCC)   . S/P CABG (coronary artery bypass graft)   . Transient ischemic attack    hx of  . Trochanteric bursitis of right hip    Allergies as of 07/28/2020      Reactions   Testosterone Other (See Comments)   ABDOMINAL PAIN and cramping   Fluoxetine Other (See Comments)   Caused depression and aggression   Requip [ropinirole] Nausea Only      Medication List       Accurate as of July 28, 2020  2:07 PM. If you have any questions, ask your nurse or doctor.        STOP taking these  medications   benzonatate 100 MG capsule Commonly known as: TESSALON Stopped by: [name], DO   doxycycline 100 MG capsule Commonly known as: VIBRAMYCIN Stopped by: [name], DO   HYDROcodone-homatropine 5-1.5 MG/5ML syrup Commonly known as: HYCODAN Stopped by: [name], DO   tamsulosin 0.4 MG Caps capsule Commonly known as: FLOMAX Stopped by: [name], DO     TAKE these medications   aspirin 81 MG tablet Take 81 mg by mouth at bedtime.   Carbidopa-Levodopa ER 25-100 MG tablet controlled release Commonly known as: SINEMET CR Take 1 tablet by mouth in the morning, at noon, and at bedtime.   clopidogrel 75 MG tablet Commonly known as: PLAVIX TAKE 1 TABLET BY MOUTH ONCE DAILY. / NEEDS APPOINTMENT FOR FUTURE REFILL / 2ND ATTEMPT   Entresto 24-26 MG Generic drug: sacubitril-valsartan Take 1 tablet by mouth 2 (two) times daily. NEEDS APPOINTMENT FOR FUTURE REFILL / 1ST ATTEMPT   ergocalciferol 1.25 MG (50000 UT) capsule Commonly known as: VITAMIN D2 Take 1 capsule (50,000 Units total) by mouth once a week. What changed: when to take this   esomeprazole 40 MG capsule Commonly known as: NEXIUM Take 40 mg by mouth at bedtime.   fluocinonide-emollient 0.05 % cream Commonly known  as: LIDEX-E Apply 1 application topically 2 (two) times daily. What changed:   when to take this  reasons to take this   fluticasone 110 MCG/ACT inhaler Commonly known as: FLOVENT HFA Inhale 2 puffs into the lungs in the morning and at bedtime. Rinse mouth out after use. Started by: [name], DO   furosemide 40 MG tablet Commonly known as: LASIX Take 1 tablet (40 mg total) by mouth daily. PLEASE CALL OFFICE TO SCHEDULE FOLLOW UP APPOINTMENT What changed: additional instructions   gabapentin 100 MG capsule Commonly known as: NEURONTIN Take 1 capsule (100 mg total) by mouth at bedtime.   levocetirizine 5 MG  tablet Commonly known as: XYZAL Take 1 tablet (5 mg total) by mouth every evening.   metoprolol tartrate 25 MG tablet Commonly known as: LOPRESSOR Take 1 tablet (25 mg total) by mouth 2 (two) times daily. NEEDS APPOINTMENT FOR FUTURE REFILLS / 2ND ATTEMPT   Multi-Vitamins Tabs Take 1 tablet by mouth daily.   nitroGLYCERIN 0.4 MG SL tablet Commonly known as: NITROSTAT Place 1 tablet (0.4 mg total) under the tongue every 5 (five) minutes as needed. Chest pain What changed:   reasons to take this  additional instructions   pravastatin 20 MG tablet Commonly known as: PRAVACHOL TAKE 1 TABLET (20 MG TOTAL) BY MOUTH DAILY.   prednisoLONE acetate 1 % ophthalmic suspension Commonly known as: PRED FORTE Place 1 drop into both eyes 2 (two) times daily.   Restasis 0.05 % ophthalmic emulsion Generic drug: cycloSPORINE Place 1 drop into both eyes 2 (two) times daily.   Slow Iron 160 (50 Fe) MG Tbcr SR tablet Generic drug: ferrous sulfate Take 160 mg by mouth every other day.   VITAMIN D-3 PO Take 1 capsule by mouth daily with breakfast.   vortioxetine HBr 5 MG Tabs tablet Commonly known as: TRINTELLIX Take 1 tablet (5 mg total) by mouth daily. What changed:   how much to take  Another medication with the same name was removed. Continue taking this medication, and follow the directions you see here. Changed by: [name], DO       Exam BP 130/80 (BP Location: Left Arm, Patient Position: Sitting, Cuff Size: Normal)   Pulse 79   Temp 98.6 F (37 C) (Oral)   Ht 5\' 6"  (1.676 m)   Wt 198 lb (89.8 kg)   SpO2 95%   BMI 31.96 kg/m  General:  well developed, well nourished, in no apparent distress Heart: RRR Lungs:  CTAB. No respiratory distress Psych: well oriented with normal range of affect and age-appropriate judgement/insight, alert and oriented x4.  Assessment and Plan  Depression, recurrent ([name]) - Plan: vortioxetine HBr (TRINTELLIX) 5 MG TABS  tablet  Vitamin D deficiency - Plan: VITAMIN D 25 Hydroxy (Vit-D Deficiency, Fractures)  Post-viral cough syndrome - Plan: fluticasone (FLOVENT HFA) 110 MCG/ACT inhaler  1.  Continue Trintellix 5 mg daily. 2.  Check labs, he is taking 1000 units of vitamin D3 daily. 3.   Continue using cough syrup for symptomatic control.  With his breathing, we will initiate an inhaled corticosteroid.  Rinse mouth after each use.  I will see him in 1 month recheck this. The patient voiced understanding and agreement to the plan.  [place], DO 07/28/20 2:07 PM

## 2020-07-29 DIAGNOSIS — K219 Gastro-esophageal reflux disease without esophagitis: Secondary | ICD-10-CM | POA: Diagnosis not present

## 2020-07-29 DIAGNOSIS — I110 Hypertensive heart disease with heart failure: Secondary | ICD-10-CM | POA: Diagnosis not present

## 2020-07-29 DIAGNOSIS — G20 Parkinson's disease: Secondary | ICD-10-CM | POA: Diagnosis not present

## 2020-07-29 DIAGNOSIS — Z7902 Long term (current) use of antithrombotics/antiplatelets: Secondary | ICD-10-CM | POA: Diagnosis not present

## 2020-07-29 DIAGNOSIS — I429 Cardiomyopathy, unspecified: Secondary | ICD-10-CM | POA: Diagnosis not present

## 2020-07-29 DIAGNOSIS — J3089 Other allergic rhinitis: Secondary | ICD-10-CM | POA: Diagnosis not present

## 2020-07-29 DIAGNOSIS — Z85038 Personal history of other malignant neoplasm of large intestine: Secondary | ICD-10-CM | POA: Diagnosis not present

## 2020-07-29 DIAGNOSIS — J449 Chronic obstructive pulmonary disease, unspecified: Secondary | ICD-10-CM | POA: Diagnosis not present

## 2020-07-29 DIAGNOSIS — M1990 Unspecified osteoarthritis, unspecified site: Secondary | ICD-10-CM | POA: Diagnosis not present

## 2020-07-29 DIAGNOSIS — Z87442 Personal history of urinary calculi: Secondary | ICD-10-CM | POA: Diagnosis not present

## 2020-07-29 DIAGNOSIS — Z7982 Long term (current) use of aspirin: Secondary | ICD-10-CM | POA: Diagnosis not present

## 2020-07-29 DIAGNOSIS — D3613 Benign neoplasm of peripheral nerves and autonomic nervous system of lower limb, including hip: Secondary | ICD-10-CM | POA: Diagnosis not present

## 2020-07-29 DIAGNOSIS — E7800 Pure hypercholesterolemia, unspecified: Secondary | ICD-10-CM | POA: Diagnosis not present

## 2020-07-29 DIAGNOSIS — I252 Old myocardial infarction: Secondary | ICD-10-CM | POA: Diagnosis not present

## 2020-07-29 DIAGNOSIS — I351 Nonrheumatic aortic (valve) insufficiency: Secondary | ICD-10-CM | POA: Diagnosis not present

## 2020-07-29 DIAGNOSIS — I25119 Atherosclerotic heart disease of native coronary artery with unspecified angina pectoris: Secondary | ICD-10-CM | POA: Diagnosis not present

## 2020-07-29 DIAGNOSIS — Z87448 Personal history of other diseases of urinary system: Secondary | ICD-10-CM | POA: Diagnosis not present

## 2020-07-29 DIAGNOSIS — I5022 Chronic systolic (congestive) heart failure: Secondary | ICD-10-CM | POA: Diagnosis not present

## 2020-07-29 DIAGNOSIS — J209 Acute bronchitis, unspecified: Secondary | ICD-10-CM | POA: Diagnosis not present

## 2020-07-29 DIAGNOSIS — Z951 Presence of aortocoronary bypass graft: Secondary | ICD-10-CM | POA: Diagnosis not present

## 2020-07-29 DIAGNOSIS — Z9181 History of falling: Secondary | ICD-10-CM | POA: Diagnosis not present

## 2020-07-29 LAB — VITAMIN D 25 HYDROXY (VIT D DEFICIENCY, FRACTURES): VITD: 28.37 ng/mL — ABNORMAL LOW (ref 30.00–100.00)

## 2020-07-30 DIAGNOSIS — I110 Hypertensive heart disease with heart failure: Secondary | ICD-10-CM | POA: Diagnosis not present

## 2020-07-30 DIAGNOSIS — I25119 Atherosclerotic heart disease of native coronary artery with unspecified angina pectoris: Secondary | ICD-10-CM | POA: Diagnosis not present

## 2020-07-30 DIAGNOSIS — Z951 Presence of aortocoronary bypass graft: Secondary | ICD-10-CM | POA: Diagnosis not present

## 2020-07-30 DIAGNOSIS — J209 Acute bronchitis, unspecified: Secondary | ICD-10-CM | POA: Diagnosis not present

## 2020-07-30 DIAGNOSIS — Z7902 Long term (current) use of antithrombotics/antiplatelets: Secondary | ICD-10-CM | POA: Diagnosis not present

## 2020-07-30 DIAGNOSIS — I351 Nonrheumatic aortic (valve) insufficiency: Secondary | ICD-10-CM | POA: Diagnosis not present

## 2020-07-30 DIAGNOSIS — G20 Parkinson's disease: Secondary | ICD-10-CM | POA: Diagnosis not present

## 2020-07-30 DIAGNOSIS — Z87442 Personal history of urinary calculi: Secondary | ICD-10-CM | POA: Diagnosis not present

## 2020-07-30 DIAGNOSIS — E7800 Pure hypercholesterolemia, unspecified: Secondary | ICD-10-CM | POA: Diagnosis not present

## 2020-07-30 DIAGNOSIS — Z7982 Long term (current) use of aspirin: Secondary | ICD-10-CM | POA: Diagnosis not present

## 2020-07-30 DIAGNOSIS — M1990 Unspecified osteoarthritis, unspecified site: Secondary | ICD-10-CM | POA: Diagnosis not present

## 2020-07-30 DIAGNOSIS — Z85038 Personal history of other malignant neoplasm of large intestine: Secondary | ICD-10-CM | POA: Diagnosis not present

## 2020-07-30 DIAGNOSIS — Z9181 History of falling: Secondary | ICD-10-CM | POA: Diagnosis not present

## 2020-07-30 DIAGNOSIS — J449 Chronic obstructive pulmonary disease, unspecified: Secondary | ICD-10-CM | POA: Diagnosis not present

## 2020-07-30 DIAGNOSIS — I252 Old myocardial infarction: Secondary | ICD-10-CM | POA: Diagnosis not present

## 2020-07-30 DIAGNOSIS — J3089 Other allergic rhinitis: Secondary | ICD-10-CM | POA: Diagnosis not present

## 2020-07-30 DIAGNOSIS — K219 Gastro-esophageal reflux disease without esophagitis: Secondary | ICD-10-CM | POA: Diagnosis not present

## 2020-07-30 DIAGNOSIS — Z87448 Personal history of other diseases of urinary system: Secondary | ICD-10-CM | POA: Diagnosis not present

## 2020-07-30 DIAGNOSIS — I5022 Chronic systolic (congestive) heart failure: Secondary | ICD-10-CM | POA: Diagnosis not present

## 2020-07-30 DIAGNOSIS — I429 Cardiomyopathy, unspecified: Secondary | ICD-10-CM | POA: Diagnosis not present

## 2020-07-30 DIAGNOSIS — D3613 Benign neoplasm of peripheral nerves and autonomic nervous system of lower limb, including hip: Secondary | ICD-10-CM | POA: Diagnosis not present

## 2020-07-31 DIAGNOSIS — Z85038 Personal history of other malignant neoplasm of large intestine: Secondary | ICD-10-CM | POA: Diagnosis not present

## 2020-07-31 DIAGNOSIS — D3613 Benign neoplasm of peripheral nerves and autonomic nervous system of lower limb, including hip: Secondary | ICD-10-CM | POA: Diagnosis not present

## 2020-07-31 DIAGNOSIS — I429 Cardiomyopathy, unspecified: Secondary | ICD-10-CM | POA: Diagnosis not present

## 2020-07-31 DIAGNOSIS — M1990 Unspecified osteoarthritis, unspecified site: Secondary | ICD-10-CM | POA: Diagnosis not present

## 2020-07-31 DIAGNOSIS — J3089 Other allergic rhinitis: Secondary | ICD-10-CM | POA: Diagnosis not present

## 2020-07-31 DIAGNOSIS — Z7902 Long term (current) use of antithrombotics/antiplatelets: Secondary | ICD-10-CM | POA: Diagnosis not present

## 2020-07-31 DIAGNOSIS — I25119 Atherosclerotic heart disease of native coronary artery with unspecified angina pectoris: Secondary | ICD-10-CM | POA: Diagnosis not present

## 2020-07-31 DIAGNOSIS — Z87448 Personal history of other diseases of urinary system: Secondary | ICD-10-CM | POA: Diagnosis not present

## 2020-07-31 DIAGNOSIS — I351 Nonrheumatic aortic (valve) insufficiency: Secondary | ICD-10-CM | POA: Diagnosis not present

## 2020-07-31 DIAGNOSIS — I110 Hypertensive heart disease with heart failure: Secondary | ICD-10-CM | POA: Diagnosis not present

## 2020-07-31 DIAGNOSIS — I5022 Chronic systolic (congestive) heart failure: Secondary | ICD-10-CM | POA: Diagnosis not present

## 2020-07-31 DIAGNOSIS — G20 Parkinson's disease: Secondary | ICD-10-CM | POA: Diagnosis not present

## 2020-07-31 DIAGNOSIS — K219 Gastro-esophageal reflux disease without esophagitis: Secondary | ICD-10-CM | POA: Diagnosis not present

## 2020-07-31 DIAGNOSIS — J449 Chronic obstructive pulmonary disease, unspecified: Secondary | ICD-10-CM | POA: Diagnosis not present

## 2020-07-31 DIAGNOSIS — Z7982 Long term (current) use of aspirin: Secondary | ICD-10-CM | POA: Diagnosis not present

## 2020-07-31 DIAGNOSIS — J209 Acute bronchitis, unspecified: Secondary | ICD-10-CM | POA: Diagnosis not present

## 2020-07-31 DIAGNOSIS — I252 Old myocardial infarction: Secondary | ICD-10-CM | POA: Diagnosis not present

## 2020-07-31 DIAGNOSIS — Z951 Presence of aortocoronary bypass graft: Secondary | ICD-10-CM | POA: Diagnosis not present

## 2020-07-31 DIAGNOSIS — Z87442 Personal history of urinary calculi: Secondary | ICD-10-CM | POA: Diagnosis not present

## 2020-07-31 DIAGNOSIS — Z9181 History of falling: Secondary | ICD-10-CM | POA: Diagnosis not present

## 2020-07-31 DIAGNOSIS — E7800 Pure hypercholesterolemia, unspecified: Secondary | ICD-10-CM | POA: Diagnosis not present

## 2020-08-04 DIAGNOSIS — Z85038 Personal history of other malignant neoplasm of large intestine: Secondary | ICD-10-CM | POA: Diagnosis not present

## 2020-08-04 DIAGNOSIS — Z7902 Long term (current) use of antithrombotics/antiplatelets: Secondary | ICD-10-CM | POA: Diagnosis not present

## 2020-08-04 DIAGNOSIS — I110 Hypertensive heart disease with heart failure: Secondary | ICD-10-CM | POA: Diagnosis not present

## 2020-08-04 DIAGNOSIS — J3089 Other allergic rhinitis: Secondary | ICD-10-CM | POA: Diagnosis not present

## 2020-08-04 DIAGNOSIS — Z7982 Long term (current) use of aspirin: Secondary | ICD-10-CM | POA: Diagnosis not present

## 2020-08-04 DIAGNOSIS — I5022 Chronic systolic (congestive) heart failure: Secondary | ICD-10-CM | POA: Diagnosis not present

## 2020-08-04 DIAGNOSIS — I429 Cardiomyopathy, unspecified: Secondary | ICD-10-CM | POA: Diagnosis not present

## 2020-08-04 DIAGNOSIS — I25119 Atherosclerotic heart disease of native coronary artery with unspecified angina pectoris: Secondary | ICD-10-CM | POA: Diagnosis not present

## 2020-08-04 DIAGNOSIS — K219 Gastro-esophageal reflux disease without esophagitis: Secondary | ICD-10-CM | POA: Diagnosis not present

## 2020-08-04 DIAGNOSIS — D3613 Benign neoplasm of peripheral nerves and autonomic nervous system of lower limb, including hip: Secondary | ICD-10-CM | POA: Diagnosis not present

## 2020-08-04 DIAGNOSIS — Z9181 History of falling: Secondary | ICD-10-CM | POA: Diagnosis not present

## 2020-08-04 DIAGNOSIS — Z87448 Personal history of other diseases of urinary system: Secondary | ICD-10-CM | POA: Diagnosis not present

## 2020-08-04 DIAGNOSIS — J209 Acute bronchitis, unspecified: Secondary | ICD-10-CM | POA: Diagnosis not present

## 2020-08-04 DIAGNOSIS — Z951 Presence of aortocoronary bypass graft: Secondary | ICD-10-CM | POA: Diagnosis not present

## 2020-08-04 DIAGNOSIS — E7800 Pure hypercholesterolemia, unspecified: Secondary | ICD-10-CM | POA: Diagnosis not present

## 2020-08-04 DIAGNOSIS — I351 Nonrheumatic aortic (valve) insufficiency: Secondary | ICD-10-CM | POA: Diagnosis not present

## 2020-08-04 DIAGNOSIS — U071 COVID-19: Secondary | ICD-10-CM | POA: Diagnosis not present

## 2020-08-04 DIAGNOSIS — M1990 Unspecified osteoarthritis, unspecified site: Secondary | ICD-10-CM | POA: Diagnosis not present

## 2020-08-04 DIAGNOSIS — I252 Old myocardial infarction: Secondary | ICD-10-CM | POA: Diagnosis not present

## 2020-08-04 DIAGNOSIS — Z87442 Personal history of urinary calculi: Secondary | ICD-10-CM | POA: Diagnosis not present

## 2020-08-04 DIAGNOSIS — G20 Parkinson's disease: Secondary | ICD-10-CM | POA: Diagnosis not present

## 2020-08-04 DIAGNOSIS — J449 Chronic obstructive pulmonary disease, unspecified: Secondary | ICD-10-CM | POA: Diagnosis not present

## 2020-08-05 ENCOUNTER — Telehealth: Payer: Self-pay | Admitting: Family Medicine

## 2020-08-05 ENCOUNTER — Other Ambulatory Visit: Payer: Self-pay | Admitting: Family Medicine

## 2020-08-05 DIAGNOSIS — E7800 Pure hypercholesterolemia, unspecified: Secondary | ICD-10-CM | POA: Diagnosis not present

## 2020-08-05 DIAGNOSIS — J209 Acute bronchitis, unspecified: Secondary | ICD-10-CM | POA: Diagnosis not present

## 2020-08-05 DIAGNOSIS — Z951 Presence of aortocoronary bypass graft: Secondary | ICD-10-CM | POA: Diagnosis not present

## 2020-08-05 DIAGNOSIS — K219 Gastro-esophageal reflux disease without esophagitis: Secondary | ICD-10-CM | POA: Diagnosis not present

## 2020-08-05 DIAGNOSIS — D3613 Benign neoplasm of peripheral nerves and autonomic nervous system of lower limb, including hip: Secondary | ICD-10-CM | POA: Diagnosis not present

## 2020-08-05 DIAGNOSIS — M1990 Unspecified osteoarthritis, unspecified site: Secondary | ICD-10-CM | POA: Diagnosis not present

## 2020-08-05 DIAGNOSIS — J302 Other seasonal allergic rhinitis: Secondary | ICD-10-CM

## 2020-08-05 DIAGNOSIS — U071 COVID-19: Secondary | ICD-10-CM | POA: Diagnosis not present

## 2020-08-05 DIAGNOSIS — G20 Parkinson's disease: Secondary | ICD-10-CM | POA: Diagnosis not present

## 2020-08-05 DIAGNOSIS — J3089 Other allergic rhinitis: Secondary | ICD-10-CM | POA: Diagnosis not present

## 2020-08-05 DIAGNOSIS — I429 Cardiomyopathy, unspecified: Secondary | ICD-10-CM | POA: Diagnosis not present

## 2020-08-05 DIAGNOSIS — I110 Hypertensive heart disease with heart failure: Secondary | ICD-10-CM | POA: Diagnosis not present

## 2020-08-05 DIAGNOSIS — Z87448 Personal history of other diseases of urinary system: Secondary | ICD-10-CM | POA: Diagnosis not present

## 2020-08-05 DIAGNOSIS — J449 Chronic obstructive pulmonary disease, unspecified: Secondary | ICD-10-CM | POA: Diagnosis not present

## 2020-08-05 DIAGNOSIS — I351 Nonrheumatic aortic (valve) insufficiency: Secondary | ICD-10-CM | POA: Diagnosis not present

## 2020-08-05 DIAGNOSIS — I2510 Atherosclerotic heart disease of native coronary artery without angina pectoris: Secondary | ICD-10-CM

## 2020-08-05 DIAGNOSIS — I5022 Chronic systolic (congestive) heart failure: Secondary | ICD-10-CM | POA: Diagnosis not present

## 2020-08-05 DIAGNOSIS — Z9181 History of falling: Secondary | ICD-10-CM | POA: Diagnosis not present

## 2020-08-05 DIAGNOSIS — Z7982 Long term (current) use of aspirin: Secondary | ICD-10-CM | POA: Diagnosis not present

## 2020-08-05 DIAGNOSIS — Z7902 Long term (current) use of antithrombotics/antiplatelets: Secondary | ICD-10-CM | POA: Diagnosis not present

## 2020-08-05 DIAGNOSIS — Z87442 Personal history of urinary calculi: Secondary | ICD-10-CM | POA: Diagnosis not present

## 2020-08-05 DIAGNOSIS — Z85038 Personal history of other malignant neoplasm of large intestine: Secondary | ICD-10-CM | POA: Diagnosis not present

## 2020-08-05 DIAGNOSIS — I25119 Atherosclerotic heart disease of native coronary artery with unspecified angina pectoris: Secondary | ICD-10-CM | POA: Diagnosis not present

## 2020-08-05 DIAGNOSIS — I252 Old myocardial infarction: Secondary | ICD-10-CM | POA: Diagnosis not present

## 2020-08-05 MED ORDER — ESOMEPRAZOLE MAGNESIUM 40 MG PO CPDR
40.0000 mg | DELAYED_RELEASE_CAPSULE | Freq: Every day | ORAL | 0 refills | Status: DC
Start: 2020-08-05 — End: 2020-08-28

## 2020-08-05 NOTE — Telephone Encounter (Signed)
Advise on plavix and Entresto not previously filled by PCP

## 2020-08-05 NOTE — Telephone Encounter (Signed)
Patient informed of PCP instructions. He call his cardiologist

## 2020-08-05 NOTE — Telephone Encounter (Signed)
Have him reach out to his cardiologist for these. Thanks.

## 2020-08-05 NOTE — Telephone Encounter (Signed)
Medication:    esomeprazole (NEXIUM) 40 MG capsule [[id number]]   sacubitril-valsartan (ENTRESTO) 24-26 MG [[id number]]   clopidogrel (PLAVIX) 75 MG tablet [[id number]]    Has the patient contacted their pharmacy?  (If no, request that the patient contact the pharmacy for the refill.) (If yes, when and what did the pharmacy advise?)     Preferred Pharmacy (with phone number or street name):  [facility] - [place], [place] - [address]  [address] [place] [address]  Phone:  [phone] Fax:  [phone]     Agent: Please be advised that RX refills may take up to 3 business days. We ask that you follow-up with your pharmacy.

## 2020-08-07 ENCOUNTER — Telehealth: Payer: Self-pay | Admitting: Cardiology

## 2020-08-07 DIAGNOSIS — I2510 Atherosclerotic heart disease of native coronary artery without angina pectoris: Secondary | ICD-10-CM

## 2020-08-07 MED ORDER — ENTRESTO 24-26 MG PO TABS
1.0000 | ORAL_TABLET | Freq: Two times a day (BID) | ORAL | 0 refills | Status: DC
Start: 2020-08-07 — End: 2020-08-20

## 2020-08-07 MED ORDER — CLOPIDOGREL BISULFATE 75 MG PO TABS: ORAL_TABLET | ORAL | 0 refills | Status: DC

## 2020-08-07 NOTE — Telephone Encounter (Signed)
*  STAT* If patient is at the pharmacy, call can be transferred to refill team.   1. Which medications need to be refilled? (please list name of each medication and dose if known)   clopidogrel (PLAVIX) 75 MG tablet  sacubitril-valsartan (ENTRESTO) 24-26 MG  2. Which pharmacy/location (including street and city if local pharmacy) is medication to be sent to? [place], [place]  3. Do they need a 30 day or 90 day supply? 90 day supply    Patient is out of medication, has an appt scheduled for 08/11/20.

## 2020-08-07 NOTE — Telephone Encounter (Signed)
Refill of Entresto 14-26 and Plavix sent to [facility]. 90 day supply not sent due to patient being overdue for an appointment. He has one scheduled 08-11-20.

## 2020-08-09 NOTE — Progress Notes (Signed)
Cardiology Office Note:    Date:  08/11/2020   ID:  VARDAAN DEPASCALE, DOB 06-Sep-1945, MRN [id number]  PCP:  [name], DO  Cardiologist:  Shirlee More, MD    Referring MD: [name]*    ASSESSMENT:    1. Ischemic cardiomyopathy   2. Chronic systolic (congestive) heart failure ([place])   3. Hypertensive heart disease with heart failure ([place])   4. Coronary artery disease involving native coronary artery of native heart with angina pectoris ([place])   5. Mixed hyperlipidemia   6. Statin intolerance    PLAN:    In order of problems listed above:  1. Stable good guideline directed medical therapy for his cardiomyopathy including beta-blocker low-dose Entresto and loop diuretic and tolerates it without hypotension.  Check labs including renal function proBNP 2. Stable CAD continue medical therapy clopidogrel beta-blocker and a low intensity statin with poor tolerance with Parkinson's 3. Stable hyperlipidemia check lipid profile liver function   Next appointment: 3 months   Medication Adjustments/Labs and Tests Ordered: Current medicines are reviewed at length with the patient today.  Concerns regarding medicines are outlined above.  Orders Placed This Encounter  Procedures  . Comprehensive metabolic panel  . Lipid panel  . Pro b natriuretic peptide (BNP)   No orders of the defined types were placed in this encounter.   Chief Complaint  Patient presents with  . Follow-up  . Coronary Artery Disease  . Cardiomyopathy    And needs refills of meds    History of Present Illness:    Shawn Meza is a 75 y.o. male with a hx of CAD with MI and subsequent bypass surgery March 2019 cardiomyopathy with heart failure hypertensive heart disease hyperlipidemia and idiopathic Parkinson's disease with orthostatic hypotension.  Intolerance being managed with a low intensity statin with his Parkinson's disease.  He was last seen 05/27/2020.  His heart failure was  improved and was tolerating Entresto low-dose beta-blocker and loop diuretic was not on MRA or SGLT2 inhibitor because of his hypotension and he had declined ICD consideration because of his neurologic disease.  He received monoclonal antibody infusion for COVID-19 infection 07/06/2020  Compliance with diet, lifestyle and medications: Yes  He is doing better but tells me that he struggled after having COVID-19.  No edema shortness of breath chest pain palpitation or syncope.  Unfortunately is limited with his Parkinson's disease and he is isolated.  Studies reviewed for today's visit: Echocardiogram 06/04/2018 showed hypokinesia of the anterior and   septal regions in tthe  distribution of the left anterior descending coronary artery ejection fraction 35 to 40% and mild aortic regurgitation. A Myoview study was performed 07/31/2018 with ejection fraction of 23% with global hypokinesiaand extensive fixed defect and no ischemia.  Past Medical History:  Diagnosis Date  . Arthritis   . BPH (benign prostatic hypertrophy)   . Chronic coronary artery disease   . Colon cancer (Shawn Meza)   . Elevated PSA   . Erectile dysfunction   . Essential hypertension   . Essential tremor   . GERD (gastroesophageal reflux disease)   . Headache(784.0)   . Hiatal hernia   . Hypercholesterolemia   . Long-term use of aspirin therapy   . Major depression, chronic   . Medial meniscus tear 10/11/2011  . Metabolic syndrome   . Morbid obesity (Shawn Meza)   . Nephrolithiasis    hx of  . NSTEMI (non-ST elevated myocardial infarction) (Shawn Meza)   . Parkinson's disease (Shawn Meza)   .  S/P CABG (coronary artery bypass graft)   . Transient ischemic attack    hx of  . Trochanteric bursitis of right hip     Past Surgical History:  Procedure Laterality Date  . CARDIAC CATHETERIZATION  5/12,1/13   4 stents placed  . COLON SURGERY    . CORONARY ARTERY BYPASS GRAFT    . KNEE ARTHROSCOPY  10/11/2011   Procedure: ARTHROSCOPY KNEE;   Surgeon: Lorn Junes, MD;  Location: [facility];  Service: Orthopedics;  Laterality: Left;  Left Knee Arthroscopy with Medial and Lateral Partial Menisectomy, Chondroplasty  . LEFT HEART CATHETERIZATION WITH CORONARY ANGIOGRAM N/A 08/25/2011   Procedure: LEFT HEART CATHETERIZATION WITH CORONARY ANGIOGRAM;  Surgeon: Burnell Blanks, MD;  Location: [facility];  Service: Cardiovascular;  Laterality: N/A;  . LITHOTRIPSY    . STERIOD INJECTION  10/11/2011   Procedure: STEROID INJECTION;  Surgeon: Lorn Junes, MD;  Location: [facility];  Service: Orthopedics;  Laterality: Right;  Steroid Injection Second Toe  . TRANSURETHRAL RESECTION OF PROSTATE    . URETHRAL DILATION      Current Medications: Current Meds  Medication Sig  . aspirin 81 MG EC tablet Take 1 tablet by mouth daily.  . Carbidopa-Levodopa ER (SINEMET CR) 25-100 MG tablet controlled release Take 1 tablet by mouth in the morning, at noon, and at bedtime.  . Cholecalciferol (VITAMIN D-3 PO) Take 1,000 Units by mouth daily with breakfast.  . clopidogrel (PLAVIX) 75 MG tablet TAKE 1 TABLET BY MOUTH ONCE DAILY. / NEEDS APPOINTMENT FOR FUTURE REFILL / 2ND ATTEMPT  . esomeprazole (NEXIUM) 40 MG capsule Take 1 capsule (40 mg total) by mouth at bedtime.  . fluocinonide-emollient (LIDEX-E) 0.05 % cream Apply 1 application topically 2 (two) times daily. (Patient taking differently: Apply 1 application topically 2 (two) times daily as needed (to affected areas).)  . furosemide (LASIX) 40 MG tablet Take 1 tablet (40 mg total) by mouth daily. PLEASE CALL OFFICE TO SCHEDULE FOLLOW UP APPOINTMENT (Patient taking differently: Take 40 mg by mouth daily.)  . gabapentin (NEURONTIN) 100 MG capsule Take 1 capsule (100 mg total) by mouth at bedtime.  Marland Kitchen levocetirizine (XYZAL) 5 MG tablet Take 1 tablet (5 mg total) by mouth every evening.  . metoprolol tartrate (LOPRESSOR) 25 MG tablet Take 1 tablet (25 mg total) by  mouth 2 (two) times daily. NEEDS APPOINTMENT FOR FUTURE REFILLS / 2ND ATTEMPT  . nitroGLYCERIN (NITROSTAT) 0.4 MG SL tablet Place 1 tablet (0.4 mg total) under the tongue every 5 (five) minutes as needed. Chest pain (Patient taking differently: Place 0.4 mg under the tongue every 5 (five) minutes as needed for chest pain.)  . pravastatin (PRAVACHOL) 20 MG tablet TAKE 1 TABLET (20 MG TOTAL) BY MOUTH DAILY.  Marland Kitchen prednisoLONE acetate (PRED FORTE) 1 % ophthalmic suspension Place 1 drop into both eyes 2 (two) times daily.  . RESTASIS 0.05 % ophthalmic emulsion Place 1 drop into both eyes 2 (two) times daily.  . sacubitril-valsartan (ENTRESTO) 24-26 MG Take 1 tablet by mouth 2 (two) times daily. NEEDS APPOINTMENT FOR FUTURE REFILL / 1ST ATTEMPT  . vortioxetine HBr (TRINTELLIX) 5 MG TABS tablet Take 1 tablet (5 mg total) by mouth daily.     Allergies:   Testosterone, Fluoxetine, and Requip [ropinirole]   Social History   Socioeconomic History  . Marital status: Divorced    Spouse name: Not on file  . Number of children: 1  . Years of education: 45  .  Highest education level: High school graduate  Occupational History  . 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 Smoking status: Never Smoker  . Smokeless tobacco: Never Used  Vaping Use  . Vaping Use: Never used  Substance and Sexual Activity  . Alcohol use: No  . Drug use: No  . Sexual activity: Not Currently  Other Topics Concern  . Not on file  Social History Narrative   Divorced 2016   Married.   Caffeine use: none       Social Determinants of Radio broadcast assistant Strain: Not on file  Food Insecurity: Not on file  Transportation Needs: Not on file  Physical Activity: Not on file  Stress: Not on file  Social Connections: Not on file     Family History: The patient's family history includes Arthritis in an other family member; Coronary artery disease in an other family member; Healthy in his  daughter; Heart disease in his brother, father, and mother; Hyperlipidemia in his brother, father, and another family member; Hypertension in an other family member; Stroke in his father. There is no history of Dementia. ROS:   Please see the history of present illness.    All other systems reviewed and are negative.  EKGs/Labs/Other Studies Reviewed:    The following studies were reviewed today:  Recent Labs: 01/22/2020: TSH 1.73 03/30/2020: ALT 8; BUN 20; Creatinine, Ser 1.00; Hemoglobin 15.6; Platelets 189; Potassium 5.2; Sodium 138  Recent Lipid Panel    Component Value Date/Time   CHOL 189 02/20/2019 1342   TRIG 180 (H) 02/20/2019 1342   TRIG 107 05/09/2010 0000   HDL 39 (L) 02/20/2019 1342   CHOLHDL 4.8 02/20/2019 1342   CHOLHDL 3.6 05/20/2015 1410   VLDL 27 05/20/2015 1410   LDLCALC 114 (H) 02/20/2019 1342    Physical Exam:    VS:  BP 130/68   Pulse (!) 110   Ht 5\' 6"  (1.676 m)   Wt 198 lb (89.8 kg)   SpO2 96%   BMI 31.96 kg/m     Wt Readings from Last 3 Encounters:  08/11/20 198 lb (89.8 kg)  07/28/20 198 lb (89.8 kg)  03/30/20 214 lb 15.2 oz (97.5 kg)     GEN: He has a marked tremor at rest well nourished, well developed in no acute distress HEENT: Normal NECK: No JVD; No carotid bruits LYMPHATICS: No lymphadenopathy CARDIAC: RRR, no murmurs, rubs, gallops RESPIRATORY:  Clear to auscultation without rales, wheezing or rhonchi  ABDOMEN: Soft, non-tender, non-distended MUSCULOSKELETAL:  No edema; No deformity  SKIN: Warm and dry NEUROLOGIC:  Alert and oriented x 3 PSYCHIATRIC:  Normal affect    Signed, Shirlee More, MD  08/11/2020 2:14 PM    [facility]

## 2020-08-10 DIAGNOSIS — Z85038 Personal history of other malignant neoplasm of large intestine: Secondary | ICD-10-CM | POA: Diagnosis not present

## 2020-08-10 DIAGNOSIS — I252 Old myocardial infarction: Secondary | ICD-10-CM | POA: Diagnosis not present

## 2020-08-10 DIAGNOSIS — I110 Hypertensive heart disease with heart failure: Secondary | ICD-10-CM | POA: Diagnosis not present

## 2020-08-10 DIAGNOSIS — K219 Gastro-esophageal reflux disease without esophagitis: Secondary | ICD-10-CM | POA: Diagnosis not present

## 2020-08-10 DIAGNOSIS — J209 Acute bronchitis, unspecified: Secondary | ICD-10-CM | POA: Diagnosis not present

## 2020-08-10 DIAGNOSIS — J3089 Other allergic rhinitis: Secondary | ICD-10-CM | POA: Diagnosis not present

## 2020-08-10 DIAGNOSIS — D3613 Benign neoplasm of peripheral nerves and autonomic nervous system of lower limb, including hip: Secondary | ICD-10-CM | POA: Diagnosis not present

## 2020-08-10 DIAGNOSIS — G20 Parkinson's disease: Secondary | ICD-10-CM | POA: Diagnosis not present

## 2020-08-10 DIAGNOSIS — Z87442 Personal history of urinary calculi: Secondary | ICD-10-CM | POA: Diagnosis not present

## 2020-08-10 DIAGNOSIS — Z7982 Long term (current) use of aspirin: Secondary | ICD-10-CM | POA: Diagnosis not present

## 2020-08-10 DIAGNOSIS — I25119 Atherosclerotic heart disease of native coronary artery with unspecified angina pectoris: Secondary | ICD-10-CM | POA: Diagnosis not present

## 2020-08-10 DIAGNOSIS — E7800 Pure hypercholesterolemia, unspecified: Secondary | ICD-10-CM | POA: Diagnosis not present

## 2020-08-10 DIAGNOSIS — Z87448 Personal history of other diseases of urinary system: Secondary | ICD-10-CM | POA: Diagnosis not present

## 2020-08-10 DIAGNOSIS — U071 COVID-19: Secondary | ICD-10-CM | POA: Diagnosis not present

## 2020-08-10 DIAGNOSIS — I351 Nonrheumatic aortic (valve) insufficiency: Secondary | ICD-10-CM | POA: Diagnosis not present

## 2020-08-10 DIAGNOSIS — Z951 Presence of aortocoronary bypass graft: Secondary | ICD-10-CM | POA: Diagnosis not present

## 2020-08-10 DIAGNOSIS — Z7902 Long term (current) use of antithrombotics/antiplatelets: Secondary | ICD-10-CM | POA: Diagnosis not present

## 2020-08-10 DIAGNOSIS — Z9181 History of falling: Secondary | ICD-10-CM | POA: Diagnosis not present

## 2020-08-10 DIAGNOSIS — I429 Cardiomyopathy, unspecified: Secondary | ICD-10-CM | POA: Diagnosis not present

## 2020-08-10 DIAGNOSIS — M1990 Unspecified osteoarthritis, unspecified site: Secondary | ICD-10-CM | POA: Diagnosis not present

## 2020-08-10 DIAGNOSIS — J449 Chronic obstructive pulmonary disease, unspecified: Secondary | ICD-10-CM | POA: Diagnosis not present

## 2020-08-10 DIAGNOSIS — I5022 Chronic systolic (congestive) heart failure: Secondary | ICD-10-CM | POA: Diagnosis not present

## 2020-08-11 ENCOUNTER — Encounter: Payer: Self-pay | Admitting: Cardiology

## 2020-08-11 ENCOUNTER — Ambulatory Visit (INDEPENDENT_AMBULATORY_CARE_PROVIDER_SITE_OTHER): Payer: Medicare Other | Admitting: Cardiology

## 2020-08-11 ENCOUNTER — Other Ambulatory Visit: Payer: Self-pay

## 2020-08-11 VITALS — BP 130/68 | HR 110 | Ht 66.0 in | Wt 198.0 lb

## 2020-08-11 DIAGNOSIS — Z789 Other specified health status: Secondary | ICD-10-CM

## 2020-08-11 DIAGNOSIS — E782 Mixed hyperlipidemia: Secondary | ICD-10-CM | POA: Diagnosis not present

## 2020-08-11 DIAGNOSIS — I110 Hypertensive heart disease with heart failure: Secondary | ICD-10-CM

## 2020-08-11 DIAGNOSIS — I25119 Atherosclerotic heart disease of native coronary artery with unspecified angina pectoris: Secondary | ICD-10-CM

## 2020-08-11 DIAGNOSIS — I5022 Chronic systolic (congestive) heart failure: Secondary | ICD-10-CM

## 2020-08-11 DIAGNOSIS — I255 Ischemic cardiomyopathy: Secondary | ICD-10-CM | POA: Diagnosis not present

## 2020-08-11 NOTE — Patient Instructions (Signed)
Medication Instructions:  Your physician recommends that you continue on your current medications as directed. Please refer to the Current Medication list given to you today.  *If you need a refill on your cardiac medications before your next appointment, please call your pharmacy*   Lab Work: Your physician recommends that you return for lab work in: TODAY CMP, Lipids, ProBNP If you have labs (blood work) drawn today and your tests are completely normal, you will receive your results only by: . MyChart Message (if you have MyChart) OR . A paper copy in the mail If you have any lab test that is abnormal or we need to change your treatment, we will call you to review the results.   Testing/Procedures: None   Follow-Up: At [facility], you and your health needs are our priority.  As part of our continuing mission to provide you with exceptional heart care, we have created designated Provider Care Teams.  These Care Teams include your primary Cardiologist (physician) and Advanced Practice Providers (APPs -  Physician Assistants and Nurse Practitioners) who all work together to provide you with the care you need, when you need it.  We recommend signing up for the patient portal called "MyChart".  Sign up information is provided on this After Visit Summary.  MyChart is used to connect with patients for Virtual Visits (Telemedicine).  Patients are able to view lab/test results, encounter notes, upcoming appointments, etc.  Non-urgent messages can be sent to your provider as well.   To learn more about what you can do with MyChart, go to [email or web address].    Your next appointment:   3 month(s)  The format for your next appointment:   In Person  Provider:   Brian [name], MD   Other Instructions   

## 2020-08-12 ENCOUNTER — Telehealth: Payer: Self-pay

## 2020-08-12 LAB — COMPREHENSIVE METABOLIC PANEL
ALT: 31 IU/L (ref 0–44)
AST: 23 IU/L (ref 0–40)
Albumin/Globulin Ratio: 1.5 (ref 1.2–2.2)
Albumin: 4.1 g/dL (ref 3.7–4.7)
Alkaline Phosphatase: 69 IU/L (ref 44–121)
BUN/Creatinine Ratio: 19 (ref 10–24)
BUN: 18 mg/dL (ref 8–27)
Bilirubin Total: 0.5 mg/dL (ref 0.0–1.2)
CO2: 21 mmol/L (ref 20–29)
Calcium: 9.3 mg/dL (ref 8.6–10.2)
Chloride: 106 mmol/L (ref 96–106)
Creatinine, Ser: 0.93 mg/dL (ref 0.76–1.27)
GFR calc Af Amer: 92 mL/min/{1.73_m2} (ref >59)
GFR calc non Af Amer: 79 mL/min/{1.73_m2} (ref >59)
Globulin, Total: 2.8 g/dL (ref 1.5–4.5)
Glucose: 101 mg/dL — ABNORMAL HIGH (ref 65–99)
Potassium: 4.5 mmol/L (ref 3.5–5.2)
Sodium: 140 mmol/L (ref 134–144)
Total Protein: 6.9 g/dL (ref 6.0–8.5)

## 2020-08-12 LAB — LIPID PANEL
Chol/HDL Ratio: 4.1 ratio (ref 0.0–5.0)
Cholesterol, Total: 138 mg/dL (ref 100–199)
HDL: 34 mg/dL — ABNORMAL LOW (ref >39)
LDL Chol Calc (NIH): 80 mg/dL (ref 0–99)
Triglycerides: 133 mg/dL (ref 0–149)
VLDL Cholesterol Cal: 24 mg/dL (ref 5–40)

## 2020-08-12 LAB — PRO B NATRIURETIC PEPTIDE: NT-Pro BNP: 551 pg/mL — ABNORMAL HIGH (ref 0–486)

## 2020-08-12 NOTE — Telephone Encounter (Signed)
Spoke with patient regarding results and recommendation.  Patient verbalizes understanding and is agreeable to plan of care. Advised patient to call back with any issues or concerns.  

## 2020-08-13 DIAGNOSIS — Z9181 History of falling: Secondary | ICD-10-CM | POA: Diagnosis not present

## 2020-08-13 DIAGNOSIS — I252 Old myocardial infarction: Secondary | ICD-10-CM | POA: Diagnosis not present

## 2020-08-13 DIAGNOSIS — Z951 Presence of aortocoronary bypass graft: Secondary | ICD-10-CM | POA: Diagnosis not present

## 2020-08-13 DIAGNOSIS — J3089 Other allergic rhinitis: Secondary | ICD-10-CM | POA: Diagnosis not present

## 2020-08-13 DIAGNOSIS — G20 Parkinson's disease: Secondary | ICD-10-CM | POA: Diagnosis not present

## 2020-08-13 DIAGNOSIS — K219 Gastro-esophageal reflux disease without esophagitis: Secondary | ICD-10-CM | POA: Diagnosis not present

## 2020-08-13 DIAGNOSIS — I5022 Chronic systolic (congestive) heart failure: Secondary | ICD-10-CM | POA: Diagnosis not present

## 2020-08-13 DIAGNOSIS — I25119 Atherosclerotic heart disease of native coronary artery with unspecified angina pectoris: Secondary | ICD-10-CM | POA: Diagnosis not present

## 2020-08-13 DIAGNOSIS — J209 Acute bronchitis, unspecified: Secondary | ICD-10-CM | POA: Diagnosis not present

## 2020-08-13 DIAGNOSIS — Z87442 Personal history of urinary calculi: Secondary | ICD-10-CM | POA: Diagnosis not present

## 2020-08-13 DIAGNOSIS — I351 Nonrheumatic aortic (valve) insufficiency: Secondary | ICD-10-CM | POA: Diagnosis not present

## 2020-08-13 DIAGNOSIS — M1990 Unspecified osteoarthritis, unspecified site: Secondary | ICD-10-CM | POA: Diagnosis not present

## 2020-08-13 DIAGNOSIS — D3613 Benign neoplasm of peripheral nerves and autonomic nervous system of lower limb, including hip: Secondary | ICD-10-CM | POA: Diagnosis not present

## 2020-08-13 DIAGNOSIS — J449 Chronic obstructive pulmonary disease, unspecified: Secondary | ICD-10-CM | POA: Diagnosis not present

## 2020-08-13 DIAGNOSIS — U071 COVID-19: Secondary | ICD-10-CM | POA: Diagnosis not present

## 2020-08-13 DIAGNOSIS — Z7982 Long term (current) use of aspirin: Secondary | ICD-10-CM | POA: Diagnosis not present

## 2020-08-13 DIAGNOSIS — Z87448 Personal history of other diseases of urinary system: Secondary | ICD-10-CM | POA: Diagnosis not present

## 2020-08-13 DIAGNOSIS — I429 Cardiomyopathy, unspecified: Secondary | ICD-10-CM | POA: Diagnosis not present

## 2020-08-13 DIAGNOSIS — Z7902 Long term (current) use of antithrombotics/antiplatelets: Secondary | ICD-10-CM | POA: Diagnosis not present

## 2020-08-13 DIAGNOSIS — E7800 Pure hypercholesterolemia, unspecified: Secondary | ICD-10-CM | POA: Diagnosis not present

## 2020-08-13 DIAGNOSIS — Z85038 Personal history of other malignant neoplasm of large intestine: Secondary | ICD-10-CM | POA: Diagnosis not present

## 2020-08-13 DIAGNOSIS — I110 Hypertensive heart disease with heart failure: Secondary | ICD-10-CM | POA: Diagnosis not present

## 2020-08-19 ENCOUNTER — Telehealth: Payer: Self-pay | Admitting: Family Medicine

## 2020-08-19 NOTE — Telephone Encounter (Signed)
Joelene Millin with [facility] states that patient was in accessible today for nurse aide bath b/c of the weather.     Call Back # [phone]

## 2020-08-20 ENCOUNTER — Other Ambulatory Visit: Payer: Self-pay | Admitting: Cardiology

## 2020-08-20 DIAGNOSIS — Z87448 Personal history of other diseases of urinary system: Secondary | ICD-10-CM | POA: Diagnosis not present

## 2020-08-20 DIAGNOSIS — I110 Hypertensive heart disease with heart failure: Secondary | ICD-10-CM | POA: Diagnosis not present

## 2020-08-20 DIAGNOSIS — G20 Parkinson's disease: Secondary | ICD-10-CM | POA: Diagnosis not present

## 2020-08-20 DIAGNOSIS — J449 Chronic obstructive pulmonary disease, unspecified: Secondary | ICD-10-CM | POA: Diagnosis not present

## 2020-08-20 DIAGNOSIS — J3089 Other allergic rhinitis: Secondary | ICD-10-CM | POA: Diagnosis not present

## 2020-08-20 DIAGNOSIS — U071 COVID-19: Secondary | ICD-10-CM | POA: Diagnosis not present

## 2020-08-20 DIAGNOSIS — I25119 Atherosclerotic heart disease of native coronary artery with unspecified angina pectoris: Secondary | ICD-10-CM | POA: Diagnosis not present

## 2020-08-20 DIAGNOSIS — E7800 Pure hypercholesterolemia, unspecified: Secondary | ICD-10-CM | POA: Diagnosis not present

## 2020-08-20 DIAGNOSIS — I2510 Atherosclerotic heart disease of native coronary artery without angina pectoris: Secondary | ICD-10-CM

## 2020-08-20 DIAGNOSIS — Z7982 Long term (current) use of aspirin: Secondary | ICD-10-CM | POA: Diagnosis not present

## 2020-08-20 DIAGNOSIS — Z85038 Personal history of other malignant neoplasm of large intestine: Secondary | ICD-10-CM | POA: Diagnosis not present

## 2020-08-20 DIAGNOSIS — Z7902 Long term (current) use of antithrombotics/antiplatelets: Secondary | ICD-10-CM | POA: Diagnosis not present

## 2020-08-20 DIAGNOSIS — I351 Nonrheumatic aortic (valve) insufficiency: Secondary | ICD-10-CM | POA: Diagnosis not present

## 2020-08-20 DIAGNOSIS — I252 Old myocardial infarction: Secondary | ICD-10-CM | POA: Diagnosis not present

## 2020-08-20 DIAGNOSIS — Z951 Presence of aortocoronary bypass graft: Secondary | ICD-10-CM | POA: Diagnosis not present

## 2020-08-20 DIAGNOSIS — I5022 Chronic systolic (congestive) heart failure: Secondary | ICD-10-CM | POA: Diagnosis not present

## 2020-08-20 DIAGNOSIS — K219 Gastro-esophageal reflux disease without esophagitis: Secondary | ICD-10-CM | POA: Diagnosis not present

## 2020-08-20 DIAGNOSIS — Z87442 Personal history of urinary calculi: Secondary | ICD-10-CM | POA: Diagnosis not present

## 2020-08-20 DIAGNOSIS — M1990 Unspecified osteoarthritis, unspecified site: Secondary | ICD-10-CM | POA: Diagnosis not present

## 2020-08-20 DIAGNOSIS — J209 Acute bronchitis, unspecified: Secondary | ICD-10-CM | POA: Diagnosis not present

## 2020-08-20 DIAGNOSIS — D3613 Benign neoplasm of peripheral nerves and autonomic nervous system of lower limb, including hip: Secondary | ICD-10-CM | POA: Diagnosis not present

## 2020-08-20 DIAGNOSIS — I429 Cardiomyopathy, unspecified: Secondary | ICD-10-CM | POA: Diagnosis not present

## 2020-08-20 DIAGNOSIS — Z9181 History of falling: Secondary | ICD-10-CM | POA: Diagnosis not present

## 2020-08-20 NOTE — Telephone Encounter (Signed)
Refill sent to pharmacy.   

## 2020-08-25 DIAGNOSIS — Z9181 History of falling: Secondary | ICD-10-CM | POA: Diagnosis not present

## 2020-08-25 DIAGNOSIS — M1990 Unspecified osteoarthritis, unspecified site: Secondary | ICD-10-CM | POA: Diagnosis not present

## 2020-08-25 DIAGNOSIS — Z7982 Long term (current) use of aspirin: Secondary | ICD-10-CM | POA: Diagnosis not present

## 2020-08-25 DIAGNOSIS — Z87442 Personal history of urinary calculi: Secondary | ICD-10-CM | POA: Diagnosis not present

## 2020-08-25 DIAGNOSIS — J3089 Other allergic rhinitis: Secondary | ICD-10-CM | POA: Diagnosis not present

## 2020-08-25 DIAGNOSIS — I252 Old myocardial infarction: Secondary | ICD-10-CM | POA: Diagnosis not present

## 2020-08-25 DIAGNOSIS — Z7902 Long term (current) use of antithrombotics/antiplatelets: Secondary | ICD-10-CM | POA: Diagnosis not present

## 2020-08-25 DIAGNOSIS — I429 Cardiomyopathy, unspecified: Secondary | ICD-10-CM | POA: Diagnosis not present

## 2020-08-25 DIAGNOSIS — K219 Gastro-esophageal reflux disease without esophagitis: Secondary | ICD-10-CM | POA: Diagnosis not present

## 2020-08-25 DIAGNOSIS — Z951 Presence of aortocoronary bypass graft: Secondary | ICD-10-CM | POA: Diagnosis not present

## 2020-08-25 DIAGNOSIS — I351 Nonrheumatic aortic (valve) insufficiency: Secondary | ICD-10-CM | POA: Diagnosis not present

## 2020-08-25 DIAGNOSIS — Z87448 Personal history of other diseases of urinary system: Secondary | ICD-10-CM | POA: Diagnosis not present

## 2020-08-25 DIAGNOSIS — Z85038 Personal history of other malignant neoplasm of large intestine: Secondary | ICD-10-CM | POA: Diagnosis not present

## 2020-08-25 DIAGNOSIS — I25119 Atherosclerotic heart disease of native coronary artery with unspecified angina pectoris: Secondary | ICD-10-CM | POA: Diagnosis not present

## 2020-08-25 DIAGNOSIS — J449 Chronic obstructive pulmonary disease, unspecified: Secondary | ICD-10-CM | POA: Diagnosis not present

## 2020-08-25 DIAGNOSIS — E7800 Pure hypercholesterolemia, unspecified: Secondary | ICD-10-CM | POA: Diagnosis not present

## 2020-08-25 DIAGNOSIS — J209 Acute bronchitis, unspecified: Secondary | ICD-10-CM | POA: Diagnosis not present

## 2020-08-25 DIAGNOSIS — I5022 Chronic systolic (congestive) heart failure: Secondary | ICD-10-CM | POA: Diagnosis not present

## 2020-08-25 DIAGNOSIS — G20 Parkinson's disease: Secondary | ICD-10-CM | POA: Diagnosis not present

## 2020-08-25 DIAGNOSIS — I110 Hypertensive heart disease with heart failure: Secondary | ICD-10-CM | POA: Diagnosis not present

## 2020-08-25 DIAGNOSIS — D3613 Benign neoplasm of peripheral nerves and autonomic nervous system of lower limb, including hip: Secondary | ICD-10-CM | POA: Diagnosis not present

## 2020-08-25 DIAGNOSIS — U071 COVID-19: Secondary | ICD-10-CM | POA: Diagnosis not present

## 2020-08-27 DIAGNOSIS — Z951 Presence of aortocoronary bypass graft: Secondary | ICD-10-CM | POA: Diagnosis not present

## 2020-08-27 DIAGNOSIS — I252 Old myocardial infarction: Secondary | ICD-10-CM | POA: Diagnosis not present

## 2020-08-27 DIAGNOSIS — K219 Gastro-esophageal reflux disease without esophagitis: Secondary | ICD-10-CM | POA: Diagnosis not present

## 2020-08-27 DIAGNOSIS — Z9181 History of falling: Secondary | ICD-10-CM | POA: Diagnosis not present

## 2020-08-27 DIAGNOSIS — J3089 Other allergic rhinitis: Secondary | ICD-10-CM | POA: Diagnosis not present

## 2020-08-27 DIAGNOSIS — I351 Nonrheumatic aortic (valve) insufficiency: Secondary | ICD-10-CM | POA: Diagnosis not present

## 2020-08-27 DIAGNOSIS — M1990 Unspecified osteoarthritis, unspecified site: Secondary | ICD-10-CM | POA: Diagnosis not present

## 2020-08-27 DIAGNOSIS — Z7902 Long term (current) use of antithrombotics/antiplatelets: Secondary | ICD-10-CM | POA: Diagnosis not present

## 2020-08-27 DIAGNOSIS — G20 Parkinson's disease: Secondary | ICD-10-CM | POA: Diagnosis not present

## 2020-08-27 DIAGNOSIS — I429 Cardiomyopathy, unspecified: Secondary | ICD-10-CM | POA: Diagnosis not present

## 2020-08-27 DIAGNOSIS — Z87448 Personal history of other diseases of urinary system: Secondary | ICD-10-CM | POA: Diagnosis not present

## 2020-08-27 DIAGNOSIS — I110 Hypertensive heart disease with heart failure: Secondary | ICD-10-CM | POA: Diagnosis not present

## 2020-08-27 DIAGNOSIS — Z7982 Long term (current) use of aspirin: Secondary | ICD-10-CM | POA: Diagnosis not present

## 2020-08-27 DIAGNOSIS — E7800 Pure hypercholesterolemia, unspecified: Secondary | ICD-10-CM | POA: Diagnosis not present

## 2020-08-27 DIAGNOSIS — I5022 Chronic systolic (congestive) heart failure: Secondary | ICD-10-CM | POA: Diagnosis not present

## 2020-08-27 DIAGNOSIS — J209 Acute bronchitis, unspecified: Secondary | ICD-10-CM | POA: Diagnosis not present

## 2020-08-27 DIAGNOSIS — J449 Chronic obstructive pulmonary disease, unspecified: Secondary | ICD-10-CM | POA: Diagnosis not present

## 2020-08-27 DIAGNOSIS — Z85038 Personal history of other malignant neoplasm of large intestine: Secondary | ICD-10-CM | POA: Diagnosis not present

## 2020-08-27 DIAGNOSIS — I25119 Atherosclerotic heart disease of native coronary artery with unspecified angina pectoris: Secondary | ICD-10-CM | POA: Diagnosis not present

## 2020-08-27 DIAGNOSIS — Z87442 Personal history of urinary calculi: Secondary | ICD-10-CM | POA: Diagnosis not present

## 2020-08-27 DIAGNOSIS — U071 COVID-19: Secondary | ICD-10-CM | POA: Diagnosis not present

## 2020-08-27 DIAGNOSIS — D3613 Benign neoplasm of peripheral nerves and autonomic nervous system of lower limb, including hip: Secondary | ICD-10-CM | POA: Diagnosis not present

## 2020-08-28 ENCOUNTER — Telehealth: Payer: Self-pay | Admitting: Family Medicine

## 2020-08-28 DIAGNOSIS — I2510 Atherosclerotic heart disease of native coronary artery without angina pectoris: Secondary | ICD-10-CM

## 2020-08-28 MED ORDER — METOPROLOL TARTRATE 25 MG PO TABS
25.0000 mg | ORAL_TABLET | Freq: Two times a day (BID) | ORAL | 1 refills | Status: DC
Start: 2020-08-28 — End: 2020-11-18

## 2020-08-28 MED ORDER — PRAVASTATIN SODIUM 20 MG PO TABS
20.0000 mg | ORAL_TABLET | Freq: Every day | ORAL | 1 refills | Status: DC
Start: 2020-08-28 — End: 2021-03-08

## 2020-08-28 MED ORDER — ESOMEPRAZOLE MAGNESIUM 40 MG PO CPDR
40.0000 mg | DELAYED_RELEASE_CAPSULE | Freq: Every day | ORAL | 3 refills | Status: DC
Start: 2020-08-28 — End: 2021-09-03

## 2020-08-28 MED ORDER — NITROGLYCERIN 0.4 MG SL SUBL: 0.4000 mg | SUBLINGUAL_TABLET | SUBLINGUAL | 3 refills | Status: DC | PRN

## 2020-08-28 MED ORDER — FUROSEMIDE 40 MG PO TABS
40.0000 mg | ORAL_TABLET | Freq: Every day | ORAL | 1 refills | Status: DC
Start: 2020-08-28 — End: 2020-11-18

## 2020-08-28 MED ORDER — GABAPENTIN 100 MG PO CAPS
100.0000 mg | ORAL_CAPSULE | Freq: Every day | ORAL | 1 refills | Status: DC
Start: 2020-08-28 — End: 2022-07-07

## 2020-08-28 NOTE — Telephone Encounter (Signed)
Caller: [facility]  prednisoLONE acetate (PRED FORTE) 1 % ophthalmic suspension [[id number]]   pravastatin (PRAVACHOL) 20 MG tablet [[id number]]   nitroGLYCERIN (NITROSTAT) 0.4 MG SL tablet [[id number]]   metoprolol tartrate (LOPRESSOR) 25 MG tablet [[id number]]   gabapentin (NEURONTIN) 100 MG capsule [[id number]]   furosemide (LASIX) 40 MG tablet [[id number]]   esomeprazole (NEXIUM) 40 MG capsule [[id number]]   [facility] - [place], [place] - [address] Phone:  [phone]  Fax:  [phone]

## 2020-08-28 NOTE — Telephone Encounter (Signed)
Rxs sent

## 2020-08-31 ENCOUNTER — Telehealth: Payer: Self-pay | Admitting: Family Medicine

## 2020-08-31 DIAGNOSIS — J302 Other seasonal allergic rhinitis: Secondary | ICD-10-CM

## 2020-08-31 DIAGNOSIS — F339 Major depressive disorder, recurrent, unspecified: Secondary | ICD-10-CM

## 2020-08-31 NOTE — Telephone Encounter (Signed)
Caller: Lauren ([facility]) Call back number: [phone]  Would like to confirm the dosage of vortioxetine   Also would like rx for the following over the counter medication. (insurance might cover cost)  Cholecalciferol (VITAMIN D-3 PO) [[id number]]   aspirin 81 MG EC tablet [[id number]]   levocetirizine (XYZAL) 5 MG tablet [[id number]]   ferrous sulfate (SLOW IRON) 160 (50 Fe) MG TBCR SR tablet [[id number]]    [facility] - [place], [place] - [address] Phone:  [phone]  Fax:  [phone]

## 2020-09-01 MED ORDER — VORTIOXETINE HBR 5 MG PO TABS: 5.0000 mg | ORAL_TABLET | Freq: Every day | ORAL | 2 refills | Status: DC

## 2020-09-01 MED ORDER — ASPIRIN 81 MG PO TBEC
81.0000 mg | DELAYED_RELEASE_TABLET | Freq: Every day | ORAL | 3 refills | Status: DC
Start: 2020-09-01 — End: 2021-08-30

## 2020-09-01 MED ORDER — SLOW IRON 160 (50 FE) MG PO TBCR
160.0000 mg | EXTENDED_RELEASE_TABLET | ORAL | 3 refills | Status: DC
Start: 2020-09-01 — End: 2021-03-25

## 2020-09-01 MED ORDER — LEVOCETIRIZINE DIHYDROCHLORIDE 5 MG PO TABS: 5.0000 mg | ORAL_TABLET | Freq: Every evening | ORAL | 1 refills | Status: DC

## 2020-09-01 NOTE — Telephone Encounter (Signed)
Refills done.

## 2020-09-01 NOTE — Telephone Encounter (Signed)
5 mg daily. That's fine to try to order those OTC things to see if it will be covered. Ty.

## 2020-09-02 DIAGNOSIS — I110 Hypertensive heart disease with heart failure: Secondary | ICD-10-CM | POA: Diagnosis not present

## 2020-09-02 DIAGNOSIS — G20 Parkinson's disease: Secondary | ICD-10-CM | POA: Diagnosis not present

## 2020-09-02 DIAGNOSIS — J3089 Other allergic rhinitis: Secondary | ICD-10-CM | POA: Diagnosis not present

## 2020-09-02 DIAGNOSIS — J209 Acute bronchitis, unspecified: Secondary | ICD-10-CM | POA: Diagnosis not present

## 2020-09-02 DIAGNOSIS — I351 Nonrheumatic aortic (valve) insufficiency: Secondary | ICD-10-CM | POA: Diagnosis not present

## 2020-09-02 DIAGNOSIS — K219 Gastro-esophageal reflux disease without esophagitis: Secondary | ICD-10-CM | POA: Diagnosis not present

## 2020-09-02 DIAGNOSIS — D3613 Benign neoplasm of peripheral nerves and autonomic nervous system of lower limb, including hip: Secondary | ICD-10-CM | POA: Diagnosis not present

## 2020-09-02 DIAGNOSIS — Z7982 Long term (current) use of aspirin: Secondary | ICD-10-CM | POA: Diagnosis not present

## 2020-09-02 DIAGNOSIS — E7800 Pure hypercholesterolemia, unspecified: Secondary | ICD-10-CM | POA: Diagnosis not present

## 2020-09-02 DIAGNOSIS — I252 Old myocardial infarction: Secondary | ICD-10-CM | POA: Diagnosis not present

## 2020-09-02 DIAGNOSIS — Z7902 Long term (current) use of antithrombotics/antiplatelets: Secondary | ICD-10-CM | POA: Diagnosis not present

## 2020-09-02 DIAGNOSIS — U071 COVID-19: Secondary | ICD-10-CM | POA: Diagnosis not present

## 2020-09-02 DIAGNOSIS — I25119 Atherosclerotic heart disease of native coronary artery with unspecified angina pectoris: Secondary | ICD-10-CM | POA: Diagnosis not present

## 2020-09-02 DIAGNOSIS — Z9181 History of falling: Secondary | ICD-10-CM | POA: Diagnosis not present

## 2020-09-02 DIAGNOSIS — Z85038 Personal history of other malignant neoplasm of large intestine: Secondary | ICD-10-CM | POA: Diagnosis not present

## 2020-09-02 DIAGNOSIS — M1990 Unspecified osteoarthritis, unspecified site: Secondary | ICD-10-CM | POA: Diagnosis not present

## 2020-09-02 DIAGNOSIS — J449 Chronic obstructive pulmonary disease, unspecified: Secondary | ICD-10-CM | POA: Diagnosis not present

## 2020-09-02 DIAGNOSIS — I5022 Chronic systolic (congestive) heart failure: Secondary | ICD-10-CM | POA: Diagnosis not present

## 2020-09-02 DIAGNOSIS — Z87442 Personal history of urinary calculi: Secondary | ICD-10-CM | POA: Diagnosis not present

## 2020-09-02 DIAGNOSIS — Z87448 Personal history of other diseases of urinary system: Secondary | ICD-10-CM | POA: Diagnosis not present

## 2020-09-02 DIAGNOSIS — I429 Cardiomyopathy, unspecified: Secondary | ICD-10-CM | POA: Diagnosis not present

## 2020-09-02 DIAGNOSIS — Z951 Presence of aortocoronary bypass graft: Secondary | ICD-10-CM | POA: Diagnosis not present

## 2020-09-03 DIAGNOSIS — Z7982 Long term (current) use of aspirin: Secondary | ICD-10-CM | POA: Diagnosis not present

## 2020-09-03 DIAGNOSIS — Z87448 Personal history of other diseases of urinary system: Secondary | ICD-10-CM | POA: Diagnosis not present

## 2020-09-03 DIAGNOSIS — U071 COVID-19: Secondary | ICD-10-CM | POA: Diagnosis not present

## 2020-09-03 DIAGNOSIS — I252 Old myocardial infarction: Secondary | ICD-10-CM | POA: Diagnosis not present

## 2020-09-03 DIAGNOSIS — J449 Chronic obstructive pulmonary disease, unspecified: Secondary | ICD-10-CM | POA: Diagnosis not present

## 2020-09-03 DIAGNOSIS — G20 Parkinson's disease: Secondary | ICD-10-CM | POA: Diagnosis not present

## 2020-09-03 DIAGNOSIS — Z7902 Long term (current) use of antithrombotics/antiplatelets: Secondary | ICD-10-CM | POA: Diagnosis not present

## 2020-09-03 DIAGNOSIS — I429 Cardiomyopathy, unspecified: Secondary | ICD-10-CM | POA: Diagnosis not present

## 2020-09-03 DIAGNOSIS — I351 Nonrheumatic aortic (valve) insufficiency: Secondary | ICD-10-CM | POA: Diagnosis not present

## 2020-09-03 DIAGNOSIS — J3089 Other allergic rhinitis: Secondary | ICD-10-CM | POA: Diagnosis not present

## 2020-09-03 DIAGNOSIS — M1990 Unspecified osteoarthritis, unspecified site: Secondary | ICD-10-CM | POA: Diagnosis not present

## 2020-09-03 DIAGNOSIS — Z87442 Personal history of urinary calculi: Secondary | ICD-10-CM | POA: Diagnosis not present

## 2020-09-03 DIAGNOSIS — D3613 Benign neoplasm of peripheral nerves and autonomic nervous system of lower limb, including hip: Secondary | ICD-10-CM | POA: Diagnosis not present

## 2020-09-03 DIAGNOSIS — I5022 Chronic systolic (congestive) heart failure: Secondary | ICD-10-CM | POA: Diagnosis not present

## 2020-09-03 DIAGNOSIS — J209 Acute bronchitis, unspecified: Secondary | ICD-10-CM | POA: Diagnosis not present

## 2020-09-03 DIAGNOSIS — I110 Hypertensive heart disease with heart failure: Secondary | ICD-10-CM | POA: Diagnosis not present

## 2020-09-03 DIAGNOSIS — K219 Gastro-esophageal reflux disease without esophagitis: Secondary | ICD-10-CM | POA: Diagnosis not present

## 2020-09-03 DIAGNOSIS — Z85038 Personal history of other malignant neoplasm of large intestine: Secondary | ICD-10-CM | POA: Diagnosis not present

## 2020-09-03 DIAGNOSIS — E7800 Pure hypercholesterolemia, unspecified: Secondary | ICD-10-CM | POA: Diagnosis not present

## 2020-09-03 DIAGNOSIS — I25119 Atherosclerotic heart disease of native coronary artery with unspecified angina pectoris: Secondary | ICD-10-CM | POA: Diagnosis not present

## 2020-09-03 DIAGNOSIS — Z9181 History of falling: Secondary | ICD-10-CM | POA: Diagnosis not present

## 2020-09-03 DIAGNOSIS — Z951 Presence of aortocoronary bypass graft: Secondary | ICD-10-CM | POA: Diagnosis not present

## 2020-09-07 ENCOUNTER — Other Ambulatory Visit: Payer: Self-pay | Admitting: Cardiology

## 2020-09-08 NOTE — Progress Notes (Signed)
Assessment/Plan:   1.  Parkinsons Disease  -Continue carbidopa/levodopa 25/100 CR, 1 tablet 3 times per day.  Told him to take med regularly.    -he looks underdosed but not sure if its because he didn't take his medication or he needs more.  Will see him back next time after he has taken med (will see him back early April)  -he had covid with sx's x 2 months and had d/c his Parkinsons Disease meds.  He is back on them now.  2.  Depression  -On Trintellix.  Managed by primary care.  Declines psychiatry.  He is starting counseling and proud of him  3.  B12 deficiency  -On supplementation.  4.  Lumbar spinal stenosis  -Has followed with Dr. Vertell Limber.   Subjective:   Shawn Meza was seen today in follow up for Parkinsons disease.  My previous records were reviewed prior to todays visit as well as outside records available to me. Pt denies falls.  Pt denies lightheadedness, near syncope.  No hallucinations.  Patient did have Covid since our last visit but pt states "I don't believe in covid."  He does state that he stayed in bed x 2 months from thanksgiving until now - he is just regaining his energy. He admits that he quit his Parkinsons Disease medications during that time.  Had nursing come to the house and got him back on this.   Is back to dating and feels good about that.  He isn't exercising.  Is having some trouble sleeping.  Saw primary care on December 28.  Last saw cardiology (Dr. [name]) on January 11.  Records reviewed.  Current prescribed movement disorder medications: Carbidopa/levodopa 25/100 CR, 1 tablet 3 times per day. B12 supplement    ALLERGIES:   Allergies  Allergen Reactions  . Testosterone Other (See Comments)    ABDOMINAL PAIN and cramping  . Fluoxetine Other (See Comments)    Caused depression and aggression  . Requip [Ropinirole] Nausea Only    CURRENT MEDICATIONS:  Outpatient Encounter Medications as of 09/10/2020  Medication Sig  . aspirin 81 MG  EC tablet Take 1 tablet (81 mg total) by mouth daily.  . Carbidopa-Levodopa ER (SINEMET CR) 25-100 MG tablet controlled release Take 1 tablet by mouth in the morning, at noon, and at bedtime.  . Cholecalciferol (VITAMIN D-3 PO) Take 1,000 Units by mouth daily with breakfast.  . clopidogrel (PLAVIX) 75 MG tablet TAKE 1 TABLET BY MOUTH ONCE DAILY.  Marland Kitchen ENTRESTO 24-26 MG TAKE ONE TABLET BY MOUTH EVERY MORNING and TAKE ONE TABLET BY MOUTH EVERYDAY AT BEDTIME  . esomeprazole (NEXIUM) 40 MG capsule Take 1 capsule (40 mg total) by mouth at bedtime.  . ferrous sulfate (SLOW IRON) 160 (50 Fe) MG TBCR SR tablet Take 1 tablet (160 mg total) by mouth every other day.  . fluocinonide-emollient (LIDEX-E) 0.05 % cream Apply 1 application topically 2 (two) times daily. (Patient taking differently: Apply 1 application topically 2 (two) times daily as needed (to affected areas).)  . fluticasone (FLOVENT HFA) 110 MCG/ACT inhaler Inhale 2 puffs into the lungs in the morning and at bedtime. Rinse mouth out after use. (Patient not taking: Reported on 08/11/2020)  . furosemide (LASIX) 40 MG tablet Take 1 tablet (40 mg total) by mouth daily.  Marland Kitchen gabapentin (NEURONTIN) 100 MG capsule Take 1 capsule (100 mg total) by mouth at bedtime.  Marland Kitchen levocetirizine (XYZAL) 5 MG tablet Take 1 tablet (5 mg total) by mouth  every evening.  . metoprolol tartrate (LOPRESSOR) 25 MG tablet Take 1 tablet (25 mg total) by mouth 2 (two) times daily.  . nitroGLYCERIN (NITROSTAT) 0.4 MG SL tablet Place 1 tablet (0.4 mg total) under the tongue every 5 (five) minutes x 3 doses as needed for chest pain.  . pravastatin (PRAVACHOL) 20 MG tablet Take 1 tablet (20 mg total) by mouth daily.  . prednisoLONE acetate (PRED FORTE) 1 % ophthalmic suspension Place 1 drop into both eyes 2 (two) times daily.  . RESTASIS 0.05 % ophthalmic emulsion Place 1 drop into both eyes 2 (two) times daily.  Marland Kitchen vortioxetine HBr (TRINTELLIX) 5 MG TABS tablet Take 1 tablet (5 mg total)  by mouth daily.   No facility-administered encounter medications on file as of 09/10/2020.    Objective:   PHYSICAL EXAMINATION:    VITALS:   Vitals:   09/10/20 1404  BP: (!) 112/58  Pulse: 81  SpO2: 97%  Weight: 200 lb (90.7 kg)  Height: 5\' 6"  (1.676 m)    GEN:  The patient appears stated age and is in NAD. HEENT:  Normocephalic, atraumatic.  The mucous membranes are moist. The superficial temporal arteries are without ropiness or tenderness. CV:  RRR Lungs:  CTAB Neck/HEME:  There are no carotid bruits bilaterally.  Neurological examination:  Orientation: The patient is alert and oriented x3. Cranial nerves: There is good facial symmetry with facial hypomimia. The speech is fluent and clear. Soft palate rises symmetrically and there is no tongue deviation. Hearing is intact to conversational tone. Sensation: Sensation is intact to light touch throughout Motor: Strength is at least antigravity x4.  He is seen at 2:30 and hasn't had med since 7am.  Movement examination: Tone: There is mod increased tone in the LUE/LLE Abnormal movements: there is LUE rest tremor Coordination:  There is mod decremation with RAM's, with any form of RAMS, including alternating supination and pronation of the forearm, hand opening and closing, finger taps, heel taps and toe taps on the L Gait and Station: The patient has mild difficulty arising out of a deep-seated chair without the use of the hands. The patient's stride length is good with slight decreased arm swing on the L.    I have reviewed and interpreted the following labs independently    Chemistry      Component Value Date/Time   NA 140 08/11/2020 1419   K 4.5 08/11/2020 1419   CL 106 08/11/2020 1419   CO2 21 08/11/2020 1419   BUN 18 08/11/2020 1419   CREATININE 0.93 08/11/2020 1419   CREATININE 1.03 05/20/2015 1410      Component Value Date/Time   CALCIUM 9.3 08/11/2020 1419   ALKPHOS 69 08/11/2020 1419   AST 23 08/11/2020  1419   ALT 31 08/11/2020 1419   BILITOT 0.5 08/11/2020 1419       Lab Results  Component Value Date   WBC 9.6 03/30/2020   HGB 15.6 03/30/2020   HCT 46.0 03/30/2020   MCV 98.4 03/30/2020   PLT 189 03/30/2020    Lab Results  Component Value Date   TSH 1.73 01/22/2020     Total time spent on today's visit was 20 minutes, including both face-to-face time and nonface-to-face time.  Time included that spent on review of records (prior notes available to me/labs/imaging if pertinent), discussing treatment and goals, answering patient's questions and coordinating care.  Cc:  [name], DO

## 2020-09-10 ENCOUNTER — Other Ambulatory Visit: Payer: Self-pay

## 2020-09-10 ENCOUNTER — Encounter: Payer: Self-pay | Admitting: Neurology

## 2020-09-10 ENCOUNTER — Ambulatory Visit (INDEPENDENT_AMBULATORY_CARE_PROVIDER_SITE_OTHER): Payer: Medicare Other | Admitting: Neurology

## 2020-09-10 VITALS — BP 112/58 | HR 81 | Ht 66.0 in | Wt 200.0 lb

## 2020-09-10 DIAGNOSIS — J449 Chronic obstructive pulmonary disease, unspecified: Secondary | ICD-10-CM | POA: Diagnosis not present

## 2020-09-10 DIAGNOSIS — M1990 Unspecified osteoarthritis, unspecified site: Secondary | ICD-10-CM | POA: Diagnosis not present

## 2020-09-10 DIAGNOSIS — Z87442 Personal history of urinary calculi: Secondary | ICD-10-CM | POA: Diagnosis not present

## 2020-09-10 DIAGNOSIS — J3089 Other allergic rhinitis: Secondary | ICD-10-CM | POA: Diagnosis not present

## 2020-09-10 DIAGNOSIS — I5022 Chronic systolic (congestive) heart failure: Secondary | ICD-10-CM | POA: Diagnosis not present

## 2020-09-10 DIAGNOSIS — I351 Nonrheumatic aortic (valve) insufficiency: Secondary | ICD-10-CM | POA: Diagnosis not present

## 2020-09-10 DIAGNOSIS — Z7982 Long term (current) use of aspirin: Secondary | ICD-10-CM | POA: Diagnosis not present

## 2020-09-10 DIAGNOSIS — G20 Parkinson's disease: Secondary | ICD-10-CM

## 2020-09-10 DIAGNOSIS — K219 Gastro-esophageal reflux disease without esophagitis: Secondary | ICD-10-CM | POA: Diagnosis not present

## 2020-09-10 DIAGNOSIS — I252 Old myocardial infarction: Secondary | ICD-10-CM | POA: Diagnosis not present

## 2020-09-10 DIAGNOSIS — I110 Hypertensive heart disease with heart failure: Secondary | ICD-10-CM | POA: Diagnosis not present

## 2020-09-10 DIAGNOSIS — Z9181 History of falling: Secondary | ICD-10-CM | POA: Diagnosis not present

## 2020-09-10 DIAGNOSIS — I25119 Atherosclerotic heart disease of native coronary artery with unspecified angina pectoris: Secondary | ICD-10-CM | POA: Diagnosis not present

## 2020-09-10 DIAGNOSIS — D3613 Benign neoplasm of peripheral nerves and autonomic nervous system of lower limb, including hip: Secondary | ICD-10-CM | POA: Diagnosis not present

## 2020-09-10 DIAGNOSIS — J209 Acute bronchitis, unspecified: Secondary | ICD-10-CM | POA: Diagnosis not present

## 2020-09-10 DIAGNOSIS — E7800 Pure hypercholesterolemia, unspecified: Secondary | ICD-10-CM | POA: Diagnosis not present

## 2020-09-10 DIAGNOSIS — Z87448 Personal history of other diseases of urinary system: Secondary | ICD-10-CM | POA: Diagnosis not present

## 2020-09-10 DIAGNOSIS — Z85038 Personal history of other malignant neoplasm of large intestine: Secondary | ICD-10-CM | POA: Diagnosis not present

## 2020-09-10 DIAGNOSIS — Z7902 Long term (current) use of antithrombotics/antiplatelets: Secondary | ICD-10-CM | POA: Diagnosis not present

## 2020-09-10 DIAGNOSIS — I429 Cardiomyopathy, unspecified: Secondary | ICD-10-CM | POA: Diagnosis not present

## 2020-09-10 DIAGNOSIS — U071 COVID-19: Secondary | ICD-10-CM | POA: Diagnosis not present

## 2020-09-10 DIAGNOSIS — Z951 Presence of aortocoronary bypass graft: Secondary | ICD-10-CM | POA: Diagnosis not present

## 2020-09-10 NOTE — Patient Instructions (Signed)
Online Resources for Power over Parkinson's Group February 2022  . Local [place] Online Groups  o Power over [employer] Group :   - Power Over Parkinson's Patient Education Group will be Wednesday, February 9th at 2pm via Zoom.   - Upcoming Power over Parkinson's Meetings:  2nd Wednesdays of the month at 2 pm:       March 9th, April 13th - Contact Amy Marriott at [email or web address] if interested in participating in this online group o Parkinson's Care Partners Group:    3rd Mondays, Contact Corwin Levins o Atypical Parkinsonian Patient Group:   4th Wednesdays, Contact Corwin Levins o If you are interested in participating in these online groups with Judson Roch, please contact her directly for how to join those meetings.  Her contact information is [email or web address].  She will send you a link to join the [employer].  (Please note that Corwin Levins , MSW, LCSW, has resigned her position at [facility], but will continue to lead the online groups temporarily)  . [place]:  [email or web address] o PD Health at Home continues:  Mindfulness Mondays, Expert Briefing Tuesdays, Wellness Wednesdays, Take Time Thursdays, Fitness Fridays -Listings for February 2022 are on the website o Upcoming Webinar:  Sights, Sounds, and Parkinson's.  Wednesday, February 2 @ 1 pm o Upcoming Webinar:  Conversations about Complementary Therapies and PD.  Wednesday, March 2 @ 1 pm o Geneticist, molecular) at [email or web address] o  Please check out their website to sign up for emails and see their full online offerings  . [place]:  [email or web address]  o Upcoming Webinar:   Moving with Mood Changes in Aging and Parkinson's:  A Look at Depression and Anxiety.  (This is a replay of a webinar originally aired June 2020)  Thursday, February 17 @ 12 noon o Check out additional information on their website to see their full online  offerings  . [place]:  [email or web address] o Upcoming Webinar:  The [place].  The Parkinson's You Don't See:  When your Autonomic System goes Off-Track.   Friday, February 18th.  Go to [email or web address], then click on Events, 07-05-1976 tab to register. o Care Partner Monthly Meetup.  With [employer] [place].  First Tuesday of each month, 2 pm o Check out additional information to Live Well Today on their website  . Parkinson and Movement Disorders (PMD) Alliance:  www.pmdalliance.org o [employer] Online:  Online Education Events o Sign up for emails, which are sent weekly to give you updates on programming and online offerings . Parkinson's Association of the [place]:  www.[email or web address] o Information on online support groups, education events, and online exercises including Yoga, Parkinson's exercises and more-LOTS of information on links to PD resources and online events o Virtual Support Group through Parkinson's Association of the [place]; next one is scheduled for Wednesday, September 30, 2020 at 2 pm. (These are typically scheduled for the 1st Wednesday of the month at 2 pm).  Visit website for details.  . Additional links for movement activities: o PWR! Moves Classes at [place] RESUMED (but are ON HOLD DUE TO COVID in February)  Contact Amy Decherd, PT [email or web address] or [phone] if interested o Here is a link to the PWR!Moves classes on Zoom from [phone] - Daily Mon-Sat at 10:00. Via Zoom, FREE and open to all.  There is also a link below via Facebook if you use that platform. - [place] - [email or web address] o  Parkinson's Wellness  Recovery (PWR! Moves)  www.[email or web address] - Info on the PWR! Virtual Experience:  You will have access to our expertise through self-assessment, guided plans that start with the PD-specific fundamentals, educational content, tips, Q&A with an expert, and a growing Art therapist of PD-specific pre-recorded and live exercise classes of varying types and intensity - both physical and cognitive! If that is not enough, we offer 1:1 wellness consultations (in-person or virtual) to personalize your PWR! Research scientist (medical).  - Check out the PWR! Move of the month on the [place] Recovery website:  [email or web address] o [employer] Fridays:  - As part of the PD Health @ Home program, this free video series focuses each week on one aspect of fitness designed to support people living with Parkinson's.  These weekly videos highlight the [place] recent fitness guidelines for people with Parkinson's disease. -  [email or web address].dk o Dance for PD website is offering free, live-stream classes throughout the week, as well as links to [employer] of classes:  [email or web address] o Dance for Parkinson's Class:  [place].  Free offering for people with Parkinson's and care partners; virtual class.  o For more information, contact [phone] or email Ruffin Frederick at [email or web address] o Virtual dance and Pilates for Parkinson's classes: Click on the Community Tab> Parkinson's Movement Initiative Tab.  To register for classes and for more information, visit [email or web address] and click the "community" tab.    o [employer] Parkinson's Cycling Classes  - [facility] [employer]: 1pm on Fridays-Live classes at [facility] [employer] at [email or web address] or [phone]) [facility] [employer]: Virtual Classes Mondays and Thursdays (contact  [place] at [email or web address] or [phone])   o [place] levels of classes are offered Tuesdays and Thursdays:  10:30 am,  12 noon & 1:45 pm at [facility]. To observe a class or for  more information, call [phone] or email [email or web address] - PD Flow Yoga for Parkinson's:  Fridays 1-2 pm, January 7-September 25, 2020 . [employer]: o Chief Technology Officer Opportunities:  www.well-springsolutions.org/caregiver-education/caregiver-support-group.  You may also contact Vickki Muff at [email or web address] or [phone].   o Virtual Caregiver Retreat, Monday, February 21st, 3-5 pm o Powerful Tools for Caregivers, 6-wk series for caregivers, planning to be in person, starting March 10th o Well-Spring Navigator:  March 23 program, a free service to help individuals and families through the journey of determining care for older adults.  The "Navigator" is a [employer], Education officer, museum, who will speak with a prospective client and/or loved ones to provide an assessment of the situation and a set of recommendations for a personalized care plan -- all free of charge, and whether [facility] [employer] offers the needed service or not. If the need is not a service we provide, we are well-connected with reputable programs in town that we can refer you to.  www.well-springsolutions.org or to speak with the Navigator, call [phone].

## 2020-09-11 ENCOUNTER — Telehealth: Payer: Self-pay | Admitting: Neurology

## 2020-09-11 DIAGNOSIS — Z87448 Personal history of other diseases of urinary system: Secondary | ICD-10-CM | POA: Diagnosis not present

## 2020-09-11 DIAGNOSIS — E7800 Pure hypercholesterolemia, unspecified: Secondary | ICD-10-CM | POA: Diagnosis not present

## 2020-09-11 DIAGNOSIS — J3089 Other allergic rhinitis: Secondary | ICD-10-CM | POA: Diagnosis not present

## 2020-09-11 DIAGNOSIS — I5022 Chronic systolic (congestive) heart failure: Secondary | ICD-10-CM | POA: Diagnosis not present

## 2020-09-11 DIAGNOSIS — I351 Nonrheumatic aortic (valve) insufficiency: Secondary | ICD-10-CM | POA: Diagnosis not present

## 2020-09-11 DIAGNOSIS — Z87442 Personal history of urinary calculi: Secondary | ICD-10-CM | POA: Diagnosis not present

## 2020-09-11 DIAGNOSIS — I429 Cardiomyopathy, unspecified: Secondary | ICD-10-CM | POA: Diagnosis not present

## 2020-09-11 DIAGNOSIS — I25119 Atherosclerotic heart disease of native coronary artery with unspecified angina pectoris: Secondary | ICD-10-CM | POA: Diagnosis not present

## 2020-09-11 DIAGNOSIS — G20 Parkinson's disease: Secondary | ICD-10-CM | POA: Diagnosis not present

## 2020-09-11 DIAGNOSIS — M1990 Unspecified osteoarthritis, unspecified site: Secondary | ICD-10-CM | POA: Diagnosis not present

## 2020-09-11 DIAGNOSIS — I252 Old myocardial infarction: Secondary | ICD-10-CM | POA: Diagnosis not present

## 2020-09-11 DIAGNOSIS — Z85038 Personal history of other malignant neoplasm of large intestine: Secondary | ICD-10-CM | POA: Diagnosis not present

## 2020-09-11 DIAGNOSIS — U071 COVID-19: Secondary | ICD-10-CM | POA: Diagnosis not present

## 2020-09-11 DIAGNOSIS — Z9181 History of falling: Secondary | ICD-10-CM | POA: Diagnosis not present

## 2020-09-11 DIAGNOSIS — J209 Acute bronchitis, unspecified: Secondary | ICD-10-CM | POA: Diagnosis not present

## 2020-09-11 DIAGNOSIS — I110 Hypertensive heart disease with heart failure: Secondary | ICD-10-CM | POA: Diagnosis not present

## 2020-09-11 DIAGNOSIS — Z7902 Long term (current) use of antithrombotics/antiplatelets: Secondary | ICD-10-CM | POA: Diagnosis not present

## 2020-09-11 DIAGNOSIS — Z7982 Long term (current) use of aspirin: Secondary | ICD-10-CM | POA: Diagnosis not present

## 2020-09-11 DIAGNOSIS — D3613 Benign neoplasm of peripheral nerves and autonomic nervous system of lower limb, including hip: Secondary | ICD-10-CM | POA: Diagnosis not present

## 2020-09-11 DIAGNOSIS — K219 Gastro-esophageal reflux disease without esophagitis: Secondary | ICD-10-CM | POA: Diagnosis not present

## 2020-09-11 DIAGNOSIS — J449 Chronic obstructive pulmonary disease, unspecified: Secondary | ICD-10-CM | POA: Diagnosis not present

## 2020-09-11 DIAGNOSIS — Z951 Presence of aortocoronary bypass graft: Secondary | ICD-10-CM | POA: Diagnosis not present

## 2020-09-11 MED ORDER — CARBIDOPA-LEVODOPA ER 25-100 MG PO TBCR
EXTENDED_RELEASE_TABLET | ORAL | 1 refills | Status: DC
Start: 2020-09-11 — End: 2020-12-09

## 2020-09-11 NOTE — Telephone Encounter (Signed)
Spoke with patient and made him aware of his new medication changes. He requested his rx go to [facility]. He gave verbal consent and I spoke with his home health nurse from [facility]. She stated she changed the patients medication to [facility] and wrote down new medication instructions.   Rx(s) sent to pharmacy electronically.

## 2020-09-11 NOTE — Telephone Encounter (Signed)
Yes, increase his carbidopa/levodopa 25/100 CR to 2 at 8am, 2 at noon, 1 at 4pm. Send new RX to pharmacy.  Keep f/u in april

## 2020-09-11 NOTE — Telephone Encounter (Signed)
Patient states that Dr [name] asked him yesterday if he had taken his medicine before he came to his appointment and he told her that he didn't think he had. He called to report that he checked his pill dispenser and he was mistaken and had taken his medication before the appointment yesterday. He asked the message be relayed to Dr [name]. He asks to please call if he needs to do anything else.

## 2020-09-15 ENCOUNTER — Other Ambulatory Visit: Payer: Self-pay | Admitting: Neurology

## 2020-09-16 DIAGNOSIS — Z8616 Personal history of COVID-19: Secondary | ICD-10-CM | POA: Diagnosis not present

## 2020-09-16 DIAGNOSIS — Z7902 Long term (current) use of antithrombotics/antiplatelets: Secondary | ICD-10-CM | POA: Diagnosis not present

## 2020-09-16 DIAGNOSIS — I252 Old myocardial infarction: Secondary | ICD-10-CM | POA: Diagnosis not present

## 2020-09-16 DIAGNOSIS — G20 Parkinson's disease: Secondary | ICD-10-CM | POA: Diagnosis not present

## 2020-09-16 DIAGNOSIS — Z9181 History of falling: Secondary | ICD-10-CM | POA: Diagnosis not present

## 2020-09-16 DIAGNOSIS — Z85038 Personal history of other malignant neoplasm of large intestine: Secondary | ICD-10-CM | POA: Diagnosis not present

## 2020-09-16 DIAGNOSIS — Z87442 Personal history of urinary calculi: Secondary | ICD-10-CM | POA: Diagnosis not present

## 2020-09-16 DIAGNOSIS — K219 Gastro-esophageal reflux disease without esophagitis: Secondary | ICD-10-CM | POA: Diagnosis not present

## 2020-09-16 DIAGNOSIS — J449 Chronic obstructive pulmonary disease, unspecified: Secondary | ICD-10-CM | POA: Diagnosis not present

## 2020-09-16 DIAGNOSIS — Z7982 Long term (current) use of aspirin: Secondary | ICD-10-CM | POA: Diagnosis not present

## 2020-09-16 DIAGNOSIS — M1990 Unspecified osteoarthritis, unspecified site: Secondary | ICD-10-CM | POA: Diagnosis not present

## 2020-09-16 DIAGNOSIS — I5022 Chronic systolic (congestive) heart failure: Secondary | ICD-10-CM | POA: Diagnosis not present

## 2020-09-16 DIAGNOSIS — Z87448 Personal history of other diseases of urinary system: Secondary | ICD-10-CM | POA: Diagnosis not present

## 2020-09-16 DIAGNOSIS — I25119 Atherosclerotic heart disease of native coronary artery with unspecified angina pectoris: Secondary | ICD-10-CM | POA: Diagnosis not present

## 2020-09-16 DIAGNOSIS — D3613 Benign neoplasm of peripheral nerves and autonomic nervous system of lower limb, including hip: Secondary | ICD-10-CM | POA: Diagnosis not present

## 2020-09-16 DIAGNOSIS — Z951 Presence of aortocoronary bypass graft: Secondary | ICD-10-CM | POA: Diagnosis not present

## 2020-09-16 DIAGNOSIS — E7800 Pure hypercholesterolemia, unspecified: Secondary | ICD-10-CM | POA: Diagnosis not present

## 2020-09-16 DIAGNOSIS — I351 Nonrheumatic aortic (valve) insufficiency: Secondary | ICD-10-CM | POA: Diagnosis not present

## 2020-09-16 DIAGNOSIS — I429 Cardiomyopathy, unspecified: Secondary | ICD-10-CM | POA: Diagnosis not present

## 2020-09-16 DIAGNOSIS — I110 Hypertensive heart disease with heart failure: Secondary | ICD-10-CM | POA: Diagnosis not present

## 2020-09-16 DIAGNOSIS — J3089 Other allergic rhinitis: Secondary | ICD-10-CM | POA: Diagnosis not present

## 2020-09-17 DIAGNOSIS — Z8616 Personal history of COVID-19: Secondary | ICD-10-CM | POA: Diagnosis not present

## 2020-09-17 DIAGNOSIS — Z951 Presence of aortocoronary bypass graft: Secondary | ICD-10-CM | POA: Diagnosis not present

## 2020-09-17 DIAGNOSIS — G20 Parkinson's disease: Secondary | ICD-10-CM | POA: Diagnosis not present

## 2020-09-17 DIAGNOSIS — K219 Gastro-esophageal reflux disease without esophagitis: Secondary | ICD-10-CM | POA: Diagnosis not present

## 2020-09-17 DIAGNOSIS — J3089 Other allergic rhinitis: Secondary | ICD-10-CM | POA: Diagnosis not present

## 2020-09-17 DIAGNOSIS — I5022 Chronic systolic (congestive) heart failure: Secondary | ICD-10-CM | POA: Diagnosis not present

## 2020-09-17 DIAGNOSIS — Z85038 Personal history of other malignant neoplasm of large intestine: Secondary | ICD-10-CM | POA: Diagnosis not present

## 2020-09-17 DIAGNOSIS — I252 Old myocardial infarction: Secondary | ICD-10-CM | POA: Diagnosis not present

## 2020-09-17 DIAGNOSIS — Z7982 Long term (current) use of aspirin: Secondary | ICD-10-CM | POA: Diagnosis not present

## 2020-09-17 DIAGNOSIS — I25119 Atherosclerotic heart disease of native coronary artery with unspecified angina pectoris: Secondary | ICD-10-CM | POA: Diagnosis not present

## 2020-09-17 DIAGNOSIS — I110 Hypertensive heart disease with heart failure: Secondary | ICD-10-CM | POA: Diagnosis not present

## 2020-09-17 DIAGNOSIS — E7800 Pure hypercholesterolemia, unspecified: Secondary | ICD-10-CM | POA: Diagnosis not present

## 2020-09-17 DIAGNOSIS — J449 Chronic obstructive pulmonary disease, unspecified: Secondary | ICD-10-CM | POA: Diagnosis not present

## 2020-09-17 DIAGNOSIS — Z7902 Long term (current) use of antithrombotics/antiplatelets: Secondary | ICD-10-CM | POA: Diagnosis not present

## 2020-09-17 DIAGNOSIS — D3613 Benign neoplasm of peripheral nerves and autonomic nervous system of lower limb, including hip: Secondary | ICD-10-CM | POA: Diagnosis not present

## 2020-09-17 DIAGNOSIS — M1990 Unspecified osteoarthritis, unspecified site: Secondary | ICD-10-CM | POA: Diagnosis not present

## 2020-09-17 DIAGNOSIS — I351 Nonrheumatic aortic (valve) insufficiency: Secondary | ICD-10-CM | POA: Diagnosis not present

## 2020-09-17 DIAGNOSIS — I429 Cardiomyopathy, unspecified: Secondary | ICD-10-CM | POA: Diagnosis not present

## 2020-09-17 DIAGNOSIS — Z87442 Personal history of urinary calculi: Secondary | ICD-10-CM | POA: Diagnosis not present

## 2020-09-17 DIAGNOSIS — Z87448 Personal history of other diseases of urinary system: Secondary | ICD-10-CM | POA: Diagnosis not present

## 2020-09-17 DIAGNOSIS — Z9181 History of falling: Secondary | ICD-10-CM | POA: Diagnosis not present

## 2020-09-24 DIAGNOSIS — Z7902 Long term (current) use of antithrombotics/antiplatelets: Secondary | ICD-10-CM | POA: Diagnosis not present

## 2020-09-24 DIAGNOSIS — J449 Chronic obstructive pulmonary disease, unspecified: Secondary | ICD-10-CM | POA: Diagnosis not present

## 2020-09-24 DIAGNOSIS — I429 Cardiomyopathy, unspecified: Secondary | ICD-10-CM | POA: Diagnosis not present

## 2020-09-24 DIAGNOSIS — I5022 Chronic systolic (congestive) heart failure: Secondary | ICD-10-CM | POA: Diagnosis not present

## 2020-09-24 DIAGNOSIS — I25119 Atherosclerotic heart disease of native coronary artery with unspecified angina pectoris: Secondary | ICD-10-CM | POA: Diagnosis not present

## 2020-09-24 DIAGNOSIS — I110 Hypertensive heart disease with heart failure: Secondary | ICD-10-CM | POA: Diagnosis not present

## 2020-09-24 DIAGNOSIS — I351 Nonrheumatic aortic (valve) insufficiency: Secondary | ICD-10-CM | POA: Diagnosis not present

## 2020-09-24 DIAGNOSIS — J3089 Other allergic rhinitis: Secondary | ICD-10-CM | POA: Diagnosis not present

## 2020-09-24 DIAGNOSIS — Z7982 Long term (current) use of aspirin: Secondary | ICD-10-CM | POA: Diagnosis not present

## 2020-09-24 DIAGNOSIS — Z87448 Personal history of other diseases of urinary system: Secondary | ICD-10-CM | POA: Diagnosis not present

## 2020-09-24 DIAGNOSIS — E7800 Pure hypercholesterolemia, unspecified: Secondary | ICD-10-CM | POA: Diagnosis not present

## 2020-09-24 DIAGNOSIS — Z9181 History of falling: Secondary | ICD-10-CM | POA: Diagnosis not present

## 2020-09-24 DIAGNOSIS — Z8616 Personal history of COVID-19: Secondary | ICD-10-CM | POA: Diagnosis not present

## 2020-09-24 DIAGNOSIS — Z85038 Personal history of other malignant neoplasm of large intestine: Secondary | ICD-10-CM | POA: Diagnosis not present

## 2020-09-24 DIAGNOSIS — Z951 Presence of aortocoronary bypass graft: Secondary | ICD-10-CM | POA: Diagnosis not present

## 2020-09-24 DIAGNOSIS — G20 Parkinson's disease: Secondary | ICD-10-CM | POA: Diagnosis not present

## 2020-09-24 DIAGNOSIS — D3613 Benign neoplasm of peripheral nerves and autonomic nervous system of lower limb, including hip: Secondary | ICD-10-CM | POA: Diagnosis not present

## 2020-09-24 DIAGNOSIS — M1990 Unspecified osteoarthritis, unspecified site: Secondary | ICD-10-CM | POA: Diagnosis not present

## 2020-09-24 DIAGNOSIS — K219 Gastro-esophageal reflux disease without esophagitis: Secondary | ICD-10-CM | POA: Diagnosis not present

## 2020-09-24 DIAGNOSIS — I252 Old myocardial infarction: Secondary | ICD-10-CM | POA: Diagnosis not present

## 2020-09-24 DIAGNOSIS — Z87442 Personal history of urinary calculi: Secondary | ICD-10-CM | POA: Diagnosis not present

## 2020-10-01 DIAGNOSIS — I351 Nonrheumatic aortic (valve) insufficiency: Secondary | ICD-10-CM | POA: Diagnosis not present

## 2020-10-01 DIAGNOSIS — Z9181 History of falling: Secondary | ICD-10-CM | POA: Diagnosis not present

## 2020-10-01 DIAGNOSIS — E7800 Pure hypercholesterolemia, unspecified: Secondary | ICD-10-CM | POA: Diagnosis not present

## 2020-10-01 DIAGNOSIS — M1990 Unspecified osteoarthritis, unspecified site: Secondary | ICD-10-CM | POA: Diagnosis not present

## 2020-10-01 DIAGNOSIS — Z8616 Personal history of COVID-19: Secondary | ICD-10-CM | POA: Diagnosis not present

## 2020-10-01 DIAGNOSIS — J449 Chronic obstructive pulmonary disease, unspecified: Secondary | ICD-10-CM | POA: Diagnosis not present

## 2020-10-01 DIAGNOSIS — K219 Gastro-esophageal reflux disease without esophagitis: Secondary | ICD-10-CM | POA: Diagnosis not present

## 2020-10-01 DIAGNOSIS — Z87448 Personal history of other diseases of urinary system: Secondary | ICD-10-CM | POA: Diagnosis not present

## 2020-10-01 DIAGNOSIS — I5022 Chronic systolic (congestive) heart failure: Secondary | ICD-10-CM | POA: Diagnosis not present

## 2020-10-01 DIAGNOSIS — D3613 Benign neoplasm of peripheral nerves and autonomic nervous system of lower limb, including hip: Secondary | ICD-10-CM | POA: Diagnosis not present

## 2020-10-01 DIAGNOSIS — Z85038 Personal history of other malignant neoplasm of large intestine: Secondary | ICD-10-CM | POA: Diagnosis not present

## 2020-10-01 DIAGNOSIS — Z951 Presence of aortocoronary bypass graft: Secondary | ICD-10-CM | POA: Diagnosis not present

## 2020-10-01 DIAGNOSIS — Z7982 Long term (current) use of aspirin: Secondary | ICD-10-CM | POA: Diagnosis not present

## 2020-10-01 DIAGNOSIS — J3089 Other allergic rhinitis: Secondary | ICD-10-CM | POA: Diagnosis not present

## 2020-10-01 DIAGNOSIS — G20 Parkinson's disease: Secondary | ICD-10-CM | POA: Diagnosis not present

## 2020-10-01 DIAGNOSIS — I252 Old myocardial infarction: Secondary | ICD-10-CM | POA: Diagnosis not present

## 2020-10-01 DIAGNOSIS — I25119 Atherosclerotic heart disease of native coronary artery with unspecified angina pectoris: Secondary | ICD-10-CM | POA: Diagnosis not present

## 2020-10-01 DIAGNOSIS — Z87442 Personal history of urinary calculi: Secondary | ICD-10-CM | POA: Diagnosis not present

## 2020-10-01 DIAGNOSIS — I429 Cardiomyopathy, unspecified: Secondary | ICD-10-CM | POA: Diagnosis not present

## 2020-10-01 DIAGNOSIS — I110 Hypertensive heart disease with heart failure: Secondary | ICD-10-CM | POA: Diagnosis not present

## 2020-10-01 DIAGNOSIS — Z7902 Long term (current) use of antithrombotics/antiplatelets: Secondary | ICD-10-CM | POA: Diagnosis not present

## 2020-10-08 DIAGNOSIS — D3613 Benign neoplasm of peripheral nerves and autonomic nervous system of lower limb, including hip: Secondary | ICD-10-CM | POA: Diagnosis not present

## 2020-10-08 DIAGNOSIS — Z951 Presence of aortocoronary bypass graft: Secondary | ICD-10-CM | POA: Diagnosis not present

## 2020-10-08 DIAGNOSIS — Z87442 Personal history of urinary calculi: Secondary | ICD-10-CM | POA: Diagnosis not present

## 2020-10-08 DIAGNOSIS — Z7982 Long term (current) use of aspirin: Secondary | ICD-10-CM | POA: Diagnosis not present

## 2020-10-08 DIAGNOSIS — J449 Chronic obstructive pulmonary disease, unspecified: Secondary | ICD-10-CM | POA: Diagnosis not present

## 2020-10-08 DIAGNOSIS — E7800 Pure hypercholesterolemia, unspecified: Secondary | ICD-10-CM | POA: Diagnosis not present

## 2020-10-08 DIAGNOSIS — Z8616 Personal history of COVID-19: Secondary | ICD-10-CM | POA: Diagnosis not present

## 2020-10-08 DIAGNOSIS — I25119 Atherosclerotic heart disease of native coronary artery with unspecified angina pectoris: Secondary | ICD-10-CM | POA: Diagnosis not present

## 2020-10-08 DIAGNOSIS — I5022 Chronic systolic (congestive) heart failure: Secondary | ICD-10-CM | POA: Diagnosis not present

## 2020-10-08 DIAGNOSIS — Z87448 Personal history of other diseases of urinary system: Secondary | ICD-10-CM | POA: Diagnosis not present

## 2020-10-08 DIAGNOSIS — I351 Nonrheumatic aortic (valve) insufficiency: Secondary | ICD-10-CM | POA: Diagnosis not present

## 2020-10-08 DIAGNOSIS — Z85038 Personal history of other malignant neoplasm of large intestine: Secondary | ICD-10-CM | POA: Diagnosis not present

## 2020-10-08 DIAGNOSIS — Z9181 History of falling: Secondary | ICD-10-CM | POA: Diagnosis not present

## 2020-10-08 DIAGNOSIS — I252 Old myocardial infarction: Secondary | ICD-10-CM | POA: Diagnosis not present

## 2020-10-08 DIAGNOSIS — G20 Parkinson's disease: Secondary | ICD-10-CM | POA: Diagnosis not present

## 2020-10-08 DIAGNOSIS — Z7902 Long term (current) use of antithrombotics/antiplatelets: Secondary | ICD-10-CM | POA: Diagnosis not present

## 2020-10-08 DIAGNOSIS — M1990 Unspecified osteoarthritis, unspecified site: Secondary | ICD-10-CM | POA: Diagnosis not present

## 2020-10-08 DIAGNOSIS — I429 Cardiomyopathy, unspecified: Secondary | ICD-10-CM | POA: Diagnosis not present

## 2020-10-08 DIAGNOSIS — J3089 Other allergic rhinitis: Secondary | ICD-10-CM | POA: Diagnosis not present

## 2020-10-08 DIAGNOSIS — I110 Hypertensive heart disease with heart failure: Secondary | ICD-10-CM | POA: Diagnosis not present

## 2020-10-08 DIAGNOSIS — K219 Gastro-esophageal reflux disease without esophagitis: Secondary | ICD-10-CM | POA: Diagnosis not present

## 2020-10-15 DIAGNOSIS — I5022 Chronic systolic (congestive) heart failure: Secondary | ICD-10-CM | POA: Diagnosis not present

## 2020-10-15 DIAGNOSIS — Z951 Presence of aortocoronary bypass graft: Secondary | ICD-10-CM | POA: Diagnosis not present

## 2020-10-15 DIAGNOSIS — I351 Nonrheumatic aortic (valve) insufficiency: Secondary | ICD-10-CM | POA: Diagnosis not present

## 2020-10-15 DIAGNOSIS — I25119 Atherosclerotic heart disease of native coronary artery with unspecified angina pectoris: Secondary | ICD-10-CM | POA: Diagnosis not present

## 2020-10-15 DIAGNOSIS — E7800 Pure hypercholesterolemia, unspecified: Secondary | ICD-10-CM | POA: Diagnosis not present

## 2020-10-15 DIAGNOSIS — Z87448 Personal history of other diseases of urinary system: Secondary | ICD-10-CM | POA: Diagnosis not present

## 2020-10-15 DIAGNOSIS — K219 Gastro-esophageal reflux disease without esophagitis: Secondary | ICD-10-CM | POA: Diagnosis not present

## 2020-10-15 DIAGNOSIS — Z8616 Personal history of COVID-19: Secondary | ICD-10-CM | POA: Diagnosis not present

## 2020-10-15 DIAGNOSIS — I110 Hypertensive heart disease with heart failure: Secondary | ICD-10-CM | POA: Diagnosis not present

## 2020-10-15 DIAGNOSIS — Z87442 Personal history of urinary calculi: Secondary | ICD-10-CM | POA: Diagnosis not present

## 2020-10-15 DIAGNOSIS — D3613 Benign neoplasm of peripheral nerves and autonomic nervous system of lower limb, including hip: Secondary | ICD-10-CM | POA: Diagnosis not present

## 2020-10-15 DIAGNOSIS — Z7982 Long term (current) use of aspirin: Secondary | ICD-10-CM | POA: Diagnosis not present

## 2020-10-15 DIAGNOSIS — M1990 Unspecified osteoarthritis, unspecified site: Secondary | ICD-10-CM | POA: Diagnosis not present

## 2020-10-15 DIAGNOSIS — G20 Parkinson's disease: Secondary | ICD-10-CM | POA: Diagnosis not present

## 2020-10-15 DIAGNOSIS — Z9181 History of falling: Secondary | ICD-10-CM | POA: Diagnosis not present

## 2020-10-15 DIAGNOSIS — I429 Cardiomyopathy, unspecified: Secondary | ICD-10-CM | POA: Diagnosis not present

## 2020-10-15 DIAGNOSIS — Z85038 Personal history of other malignant neoplasm of large intestine: Secondary | ICD-10-CM | POA: Diagnosis not present

## 2020-10-15 DIAGNOSIS — J3089 Other allergic rhinitis: Secondary | ICD-10-CM | POA: Diagnosis not present

## 2020-10-15 DIAGNOSIS — Z7902 Long term (current) use of antithrombotics/antiplatelets: Secondary | ICD-10-CM | POA: Diagnosis not present

## 2020-10-15 DIAGNOSIS — I252 Old myocardial infarction: Secondary | ICD-10-CM | POA: Diagnosis not present

## 2020-10-15 DIAGNOSIS — J449 Chronic obstructive pulmonary disease, unspecified: Secondary | ICD-10-CM | POA: Diagnosis not present

## 2020-10-22 DIAGNOSIS — I25119 Atherosclerotic heart disease of native coronary artery with unspecified angina pectoris: Secondary | ICD-10-CM | POA: Diagnosis not present

## 2020-10-22 DIAGNOSIS — I5022 Chronic systolic (congestive) heart failure: Secondary | ICD-10-CM | POA: Diagnosis not present

## 2020-10-22 DIAGNOSIS — I252 Old myocardial infarction: Secondary | ICD-10-CM | POA: Diagnosis not present

## 2020-10-22 DIAGNOSIS — K219 Gastro-esophageal reflux disease without esophagitis: Secondary | ICD-10-CM | POA: Diagnosis not present

## 2020-10-22 DIAGNOSIS — Z7902 Long term (current) use of antithrombotics/antiplatelets: Secondary | ICD-10-CM | POA: Diagnosis not present

## 2020-10-22 DIAGNOSIS — Z87442 Personal history of urinary calculi: Secondary | ICD-10-CM | POA: Diagnosis not present

## 2020-10-22 DIAGNOSIS — M1990 Unspecified osteoarthritis, unspecified site: Secondary | ICD-10-CM | POA: Diagnosis not present

## 2020-10-22 DIAGNOSIS — Z7982 Long term (current) use of aspirin: Secondary | ICD-10-CM | POA: Diagnosis not present

## 2020-10-22 DIAGNOSIS — Z8616 Personal history of COVID-19: Secondary | ICD-10-CM | POA: Diagnosis not present

## 2020-10-22 DIAGNOSIS — Z85038 Personal history of other malignant neoplasm of large intestine: Secondary | ICD-10-CM | POA: Diagnosis not present

## 2020-10-22 DIAGNOSIS — Z951 Presence of aortocoronary bypass graft: Secondary | ICD-10-CM | POA: Diagnosis not present

## 2020-10-22 DIAGNOSIS — J449 Chronic obstructive pulmonary disease, unspecified: Secondary | ICD-10-CM | POA: Diagnosis not present

## 2020-10-22 DIAGNOSIS — I351 Nonrheumatic aortic (valve) insufficiency: Secondary | ICD-10-CM | POA: Diagnosis not present

## 2020-10-22 DIAGNOSIS — Z87448 Personal history of other diseases of urinary system: Secondary | ICD-10-CM | POA: Diagnosis not present

## 2020-10-22 DIAGNOSIS — J3089 Other allergic rhinitis: Secondary | ICD-10-CM | POA: Diagnosis not present

## 2020-10-22 DIAGNOSIS — I429 Cardiomyopathy, unspecified: Secondary | ICD-10-CM | POA: Diagnosis not present

## 2020-10-22 DIAGNOSIS — I110 Hypertensive heart disease with heart failure: Secondary | ICD-10-CM | POA: Diagnosis not present

## 2020-10-22 DIAGNOSIS — Z9181 History of falling: Secondary | ICD-10-CM | POA: Diagnosis not present

## 2020-10-22 DIAGNOSIS — G20 Parkinson's disease: Secondary | ICD-10-CM | POA: Diagnosis not present

## 2020-10-22 DIAGNOSIS — D3613 Benign neoplasm of peripheral nerves and autonomic nervous system of lower limb, including hip: Secondary | ICD-10-CM | POA: Diagnosis not present

## 2020-10-22 DIAGNOSIS — E7800 Pure hypercholesterolemia, unspecified: Secondary | ICD-10-CM | POA: Diagnosis not present

## 2020-10-29 DIAGNOSIS — I25119 Atherosclerotic heart disease of native coronary artery with unspecified angina pectoris: Secondary | ICD-10-CM | POA: Diagnosis not present

## 2020-10-29 DIAGNOSIS — Z87442 Personal history of urinary calculi: Secondary | ICD-10-CM | POA: Diagnosis not present

## 2020-10-29 DIAGNOSIS — I429 Cardiomyopathy, unspecified: Secondary | ICD-10-CM | POA: Diagnosis not present

## 2020-10-29 DIAGNOSIS — J449 Chronic obstructive pulmonary disease, unspecified: Secondary | ICD-10-CM | POA: Diagnosis not present

## 2020-10-29 DIAGNOSIS — Z7982 Long term (current) use of aspirin: Secondary | ICD-10-CM | POA: Diagnosis not present

## 2020-10-29 DIAGNOSIS — Z7902 Long term (current) use of antithrombotics/antiplatelets: Secondary | ICD-10-CM | POA: Diagnosis not present

## 2020-10-29 DIAGNOSIS — D3613 Benign neoplasm of peripheral nerves and autonomic nervous system of lower limb, including hip: Secondary | ICD-10-CM | POA: Diagnosis not present

## 2020-10-29 DIAGNOSIS — G20 Parkinson's disease: Secondary | ICD-10-CM | POA: Diagnosis not present

## 2020-10-29 DIAGNOSIS — Z951 Presence of aortocoronary bypass graft: Secondary | ICD-10-CM | POA: Diagnosis not present

## 2020-10-29 DIAGNOSIS — Z8616 Personal history of COVID-19: Secondary | ICD-10-CM | POA: Diagnosis not present

## 2020-10-29 DIAGNOSIS — I5022 Chronic systolic (congestive) heart failure: Secondary | ICD-10-CM | POA: Diagnosis not present

## 2020-10-29 DIAGNOSIS — I110 Hypertensive heart disease with heart failure: Secondary | ICD-10-CM | POA: Diagnosis not present

## 2020-10-29 DIAGNOSIS — Z85038 Personal history of other malignant neoplasm of large intestine: Secondary | ICD-10-CM | POA: Diagnosis not present

## 2020-10-29 DIAGNOSIS — Z9181 History of falling: Secondary | ICD-10-CM | POA: Diagnosis not present

## 2020-10-29 DIAGNOSIS — I252 Old myocardial infarction: Secondary | ICD-10-CM | POA: Diagnosis not present

## 2020-10-29 DIAGNOSIS — I351 Nonrheumatic aortic (valve) insufficiency: Secondary | ICD-10-CM | POA: Diagnosis not present

## 2020-10-29 DIAGNOSIS — E7800 Pure hypercholesterolemia, unspecified: Secondary | ICD-10-CM | POA: Diagnosis not present

## 2020-10-29 DIAGNOSIS — M1990 Unspecified osteoarthritis, unspecified site: Secondary | ICD-10-CM | POA: Diagnosis not present

## 2020-10-29 DIAGNOSIS — K219 Gastro-esophageal reflux disease without esophagitis: Secondary | ICD-10-CM | POA: Diagnosis not present

## 2020-10-29 DIAGNOSIS — Z87448 Personal history of other diseases of urinary system: Secondary | ICD-10-CM | POA: Diagnosis not present

## 2020-10-29 DIAGNOSIS — J3089 Other allergic rhinitis: Secondary | ICD-10-CM | POA: Diagnosis not present

## 2020-10-30 NOTE — Progress Notes (Signed)
Assessment/Plan:   1.  Parkinsons Disease  -Continue carbidopa/levodopa 25/100 CR, 2 tablets at 8 AM/2 tablets at noon/1 tablet at 4 PM  -add carbidopa/levodopa 50/200 CR at bed to see if helps with late night anxiety.  If not, needs to f/u with PCP for change/increase with trintellix  2.  Depression  -On Trintellix.  Managed by primary care.  Declines psychiatry.  He tried to start counseling but it was unsuccessful.  He wants to do it in person and is frustrated with the online service.  We will schedule him here with SW  3.  B12 deficiency  -On supplementation.  4.  Lumbar spinal stenosis  -Has followed with Dr. Vertell Limber.  Having increasing pain and we will send referral back.  5.  Insomnia  -try to decrease dosage of melatonin to 3 mg. Subjective:   Shawn Meza was seen today in follow up for Parkinsons disease.  My previous records were reviewed prior to todays visit as well as outside records available to me. Pt denies falls.  Pt denies lightheadedness, near syncope.  No hallucinations.  Patient did look under dosed last visit, but he was long overdue for medication, and so we decided to hold off on increasing the medication.  However, the patient ended up calling back after the appointment stating that he had actually taken his medication and asked if I wanted to increase it.  We ended up doing that.  He reports that he is doing well and feels better.  He states that the nurse from his insurance came out of the house and told him to take 10 mg of melatonin to help him sleep and he has been drowsy during the day.  He just started taking it and last night he even cut it in half.    Current prescribed movement disorder medications: Carbidopa/levodopa 25/100 CR, 2 at 8 AM/2 at noon/1 at 4 PM (increased last visit) B12 supplement    ALLERGIES:   Allergies  Allergen Reactions  . Testosterone Other (See Comments)    ABDOMINAL PAIN and cramping  . Fluoxetine Other (See Comments)     Caused depression and aggression  . Requip [Ropinirole] Nausea Only    CURRENT MEDICATIONS:  Outpatient Encounter Medications as of 11/02/2020  Medication Sig  . aspirin 81 MG EC tablet Take 1 tablet (81 mg total) by mouth daily.  . Carbidopa-Levodopa ER (SINEMET CR) 25-100 MG tablet controlled release Take 2 tabs at 8am/ 2 and noon and 1 tab at 4pm  . Cholecalciferol (VITAMIN D-3 PO) Take 1,000 Units by mouth daily with breakfast.  . clopidogrel (PLAVIX) 75 MG tablet TAKE 1 TABLET BY MOUTH ONCE DAILY.  Marland Kitchen ENTRESTO 24-26 MG TAKE ONE TABLET BY MOUTH EVERY MORNING and TAKE ONE TABLET BY MOUTH EVERYDAY AT BEDTIME  . esomeprazole (NEXIUM) 40 MG capsule Take 1 capsule (40 mg total) by mouth at bedtime.  . ferrous sulfate (SLOW IRON) 160 (50 Fe) MG TBCR SR tablet Take 1 tablet (160 mg total) by mouth every other day.  . fluocinonide-emollient (LIDEX-E) 0.05 % cream Apply 1 application topically 2 (two) times daily. (Patient taking differently: Apply 1 application topically 2 (two) times daily as needed (to affected areas).)  . furosemide (LASIX) 40 MG tablet Take 1 tablet (40 mg total) by mouth daily.  Marland Kitchen gabapentin (NEURONTIN) 100 MG capsule Take 1 capsule (100 mg total) by mouth at bedtime.  . metoprolol tartrate (LOPRESSOR) 25 MG tablet Take 1 tablet (25 mg  total) by mouth 2 (two) times daily.  . nitroGLYCERIN (NITROSTAT) 0.4 MG SL tablet Place 1 tablet (0.4 mg total) under the tongue every 5 (five) minutes x 3 doses as needed for chest pain.  . pravastatin (PRAVACHOL) 20 MG tablet Take 1 tablet (20 mg total) by mouth daily.  . prednisoLONE acetate (PRED FORTE) 1 % ophthalmic suspension Place 1 drop into both eyes 2 (two) times daily.  . RESTASIS 0.05 % ophthalmic emulsion Place 1 drop into both eyes 2 (two) times daily.  Marland Kitchen vortioxetine HBr (TRINTELLIX) 5 MG TABS tablet Take 1 tablet (5 mg total) by mouth daily.  . fluticasone (FLOVENT HFA) 110 MCG/ACT inhaler Inhale 2 puffs into the lungs in the  morning and at bedtime. Rinse mouth out after use. (Patient not taking: Reported on 08/11/2020)  . [DISCONTINUED] levocetirizine (XYZAL) 5 MG tablet Take 1 tablet (5 mg total) by mouth every evening. (Patient not taking: Reported on 11/02/2020)   No facility-administered encounter medications on file as of 11/02/2020.    Objective:   PHYSICAL EXAMINATION:    VITALS:   Vitals:   11/02/20 1354  BP: (!) 112/58  Pulse: 68  SpO2: 98%  Weight: 206 lb (93.4 kg)  Height: 5\' 6"  (1.676 m)    GEN:  The patient appears stated age and is in NAD. HEENT:  Normocephalic, atraumatic.  The mucous membranes are moist. The superficial temporal arteries are without ropiness or tenderness. CV:  RRR Lungs:  CTAB Neck/HEME:  There are no carotid bruits bilaterally.  Neurological examination:  Orientation: The patient is alert and oriented x3. Cranial nerves: There is good facial symmetry with facial hypomimia. The speech is fluent and clear. Soft palate rises symmetrically and there is no tongue deviation. Hearing is intact to conversational tone. Sensation: Sensation is intact to light touch throughout Motor: Strength is at least antigravity x4.    Movement examination: Tone: There is nl increased tone in the LUE/LLE Abnormal movements: there is LUE rest tremor Coordination:  There is min decremation with RAM's, mostly with foot taps on the left Gait and Station: The patient has mild difficulty arising out of a deep-seated chair without the use of the hands. The patient's stride length is good with slight decreased arm swing on the L.    I have reviewed and interpreted the following labs independently    Chemistry      Component Value Date/Time   NA 140 08/11/2020 1419   K 4.5 08/11/2020 1419   CL 106 08/11/2020 1419   CO2 21 08/11/2020 1419   BUN 18 08/11/2020 1419   CREATININE 0.93 08/11/2020 1419   CREATININE 1.03 05/20/2015 1410      Component Value Date/Time   CALCIUM 9.3 08/11/2020  1419   ALKPHOS 69 08/11/2020 1419   AST 23 08/11/2020 1419   ALT 31 08/11/2020 1419   BILITOT 0.5 08/11/2020 1419       Lab Results  Component Value Date   WBC 9.6 03/30/2020   HGB 15.6 03/30/2020   HCT 46.0 03/30/2020   MCV 98.4 03/30/2020   PLT 189 03/30/2020    Lab Results  Component Value Date   TSH 1.73 01/22/2020     Total time spent on today's visit was 30 minutes, including both face-to-face time and nonface-to-face time.  Time included that spent on review of records (prior notes available to me/labs/imaging if pertinent), discussing treatment and goals, answering patient's questions and coordinating care.  Cc:  [name], DO

## 2020-11-02 ENCOUNTER — Ambulatory Visit (INDEPENDENT_AMBULATORY_CARE_PROVIDER_SITE_OTHER): Payer: Medicare Other | Admitting: Neurology

## 2020-11-02 ENCOUNTER — Other Ambulatory Visit: Payer: Self-pay

## 2020-11-02 ENCOUNTER — Encounter: Payer: Self-pay | Admitting: Neurology

## 2020-11-02 VITALS — BP 112/58 | HR 68 | Ht 66.0 in | Wt 206.0 lb

## 2020-11-02 DIAGNOSIS — G20 Parkinson's disease: Secondary | ICD-10-CM

## 2020-11-02 DIAGNOSIS — F330 Major depressive disorder, recurrent, mild: Secondary | ICD-10-CM | POA: Diagnosis not present

## 2020-11-02 MED ORDER — CARBIDOPA-LEVODOPA ER 50-200 MG PO TBCR: 1.0000 | EXTENDED_RELEASE_TABLET | Freq: Every day | ORAL | 1 refills | Status: DC

## 2020-11-02 NOTE — Patient Instructions (Addendum)
1.  Take only 3 mg of melatonin , and try the sublinguinal version 2.  Continue carbidopa/levodopa 25/100 as previously prescribed 3.  Add carbidopa/levodopa 50/200 CR at bedtime

## 2020-11-04 ENCOUNTER — Ambulatory Visit: Payer: Medicare Other | Admitting: Neurology

## 2020-11-04 DIAGNOSIS — K449 Diaphragmatic hernia without obstruction or gangrene: Secondary | ICD-10-CM | POA: Insufficient documentation

## 2020-11-04 DIAGNOSIS — I2510 Atherosclerotic heart disease of native coronary artery without angina pectoris: Secondary | ICD-10-CM | POA: Insufficient documentation

## 2020-11-04 DIAGNOSIS — M1990 Unspecified osteoarthritis, unspecified site: Secondary | ICD-10-CM | POA: Insufficient documentation

## 2020-11-04 DIAGNOSIS — R197 Diarrhea, unspecified: Secondary | ICD-10-CM | POA: Insufficient documentation

## 2020-11-04 DIAGNOSIS — K625 Hemorrhage of anus and rectum: Secondary | ICD-10-CM

## 2020-11-04 DIAGNOSIS — I214 Non-ST elevation (NSTEMI) myocardial infarction: Secondary | ICD-10-CM | POA: Insufficient documentation

## 2020-11-04 DIAGNOSIS — G459 Transient cerebral ischemic attack, unspecified: Secondary | ICD-10-CM | POA: Insufficient documentation

## 2020-11-04 DIAGNOSIS — F329 Major depressive disorder, single episode, unspecified: Secondary | ICD-10-CM | POA: Insufficient documentation

## 2020-11-04 DIAGNOSIS — K219 Gastro-esophageal reflux disease without esophagitis: Secondary | ICD-10-CM | POA: Insufficient documentation

## 2020-11-04 DIAGNOSIS — N200 Calculus of kidney: Secondary | ICD-10-CM | POA: Insufficient documentation

## 2020-11-04 DIAGNOSIS — E8881 Metabolic syndrome: Secondary | ICD-10-CM | POA: Insufficient documentation

## 2020-11-04 DIAGNOSIS — R972 Elevated prostate specific antigen [PSA]: Secondary | ICD-10-CM | POA: Insufficient documentation

## 2020-11-04 DIAGNOSIS — E7800 Pure hypercholesterolemia, unspecified: Secondary | ICD-10-CM | POA: Insufficient documentation

## 2020-11-04 DIAGNOSIS — Z7982 Long term (current) use of aspirin: Secondary | ICD-10-CM | POA: Insufficient documentation

## 2020-11-04 DIAGNOSIS — C189 Malignant neoplasm of colon, unspecified: Secondary | ICD-10-CM | POA: Insufficient documentation

## 2020-11-04 DIAGNOSIS — M7061 Trochanteric bursitis, right hip: Secondary | ICD-10-CM | POA: Insufficient documentation

## 2020-11-04 DIAGNOSIS — I10 Essential (primary) hypertension: Secondary | ICD-10-CM | POA: Insufficient documentation

## 2020-11-04 DIAGNOSIS — N529 Male erectile dysfunction, unspecified: Secondary | ICD-10-CM | POA: Insufficient documentation

## 2020-11-04 DIAGNOSIS — G250 Essential tremor: Secondary | ICD-10-CM | POA: Insufficient documentation

## 2020-11-04 HISTORY — DX: Hemorrhage of anus and rectum: K62.5

## 2020-11-04 HISTORY — DX: Diarrhea, unspecified: R19.7

## 2020-11-05 DIAGNOSIS — J449 Chronic obstructive pulmonary disease, unspecified: Secondary | ICD-10-CM | POA: Diagnosis not present

## 2020-11-05 DIAGNOSIS — Z951 Presence of aortocoronary bypass graft: Secondary | ICD-10-CM | POA: Diagnosis not present

## 2020-11-05 DIAGNOSIS — E7800 Pure hypercholesterolemia, unspecified: Secondary | ICD-10-CM | POA: Diagnosis not present

## 2020-11-05 DIAGNOSIS — Z7902 Long term (current) use of antithrombotics/antiplatelets: Secondary | ICD-10-CM | POA: Diagnosis not present

## 2020-11-05 DIAGNOSIS — Z85038 Personal history of other malignant neoplasm of large intestine: Secondary | ICD-10-CM | POA: Diagnosis not present

## 2020-11-05 DIAGNOSIS — Z9181 History of falling: Secondary | ICD-10-CM | POA: Diagnosis not present

## 2020-11-05 DIAGNOSIS — I351 Nonrheumatic aortic (valve) insufficiency: Secondary | ICD-10-CM | POA: Diagnosis not present

## 2020-11-05 DIAGNOSIS — M1990 Unspecified osteoarthritis, unspecified site: Secondary | ICD-10-CM | POA: Diagnosis not present

## 2020-11-05 DIAGNOSIS — I252 Old myocardial infarction: Secondary | ICD-10-CM | POA: Diagnosis not present

## 2020-11-05 DIAGNOSIS — Z8616 Personal history of COVID-19: Secondary | ICD-10-CM | POA: Diagnosis not present

## 2020-11-05 DIAGNOSIS — I429 Cardiomyopathy, unspecified: Secondary | ICD-10-CM | POA: Diagnosis not present

## 2020-11-05 DIAGNOSIS — Z7982 Long term (current) use of aspirin: Secondary | ICD-10-CM | POA: Diagnosis not present

## 2020-11-05 DIAGNOSIS — G20 Parkinson's disease: Secondary | ICD-10-CM | POA: Diagnosis not present

## 2020-11-05 DIAGNOSIS — I110 Hypertensive heart disease with heart failure: Secondary | ICD-10-CM | POA: Diagnosis not present

## 2020-11-05 DIAGNOSIS — I25119 Atherosclerotic heart disease of native coronary artery with unspecified angina pectoris: Secondary | ICD-10-CM | POA: Diagnosis not present

## 2020-11-05 DIAGNOSIS — I5022 Chronic systolic (congestive) heart failure: Secondary | ICD-10-CM | POA: Diagnosis not present

## 2020-11-05 DIAGNOSIS — Z87448 Personal history of other diseases of urinary system: Secondary | ICD-10-CM | POA: Diagnosis not present

## 2020-11-05 DIAGNOSIS — D3613 Benign neoplasm of peripheral nerves and autonomic nervous system of lower limb, including hip: Secondary | ICD-10-CM | POA: Diagnosis not present

## 2020-11-05 DIAGNOSIS — K219 Gastro-esophageal reflux disease without esophagitis: Secondary | ICD-10-CM | POA: Diagnosis not present

## 2020-11-05 DIAGNOSIS — Z87442 Personal history of urinary calculi: Secondary | ICD-10-CM | POA: Diagnosis not present

## 2020-11-05 DIAGNOSIS — J3089 Other allergic rhinitis: Secondary | ICD-10-CM | POA: Diagnosis not present

## 2020-11-09 ENCOUNTER — Ambulatory Visit (INDEPENDENT_AMBULATORY_CARE_PROVIDER_SITE_OTHER): Payer: Medicare Other | Admitting: Licensed Clinical Social Worker

## 2020-11-09 ENCOUNTER — Encounter: Payer: Self-pay | Admitting: Licensed Clinical Social Worker

## 2020-11-09 ENCOUNTER — Other Ambulatory Visit: Payer: Self-pay

## 2020-11-09 DIAGNOSIS — F330 Major depressive disorder, recurrent, mild: Secondary | ICD-10-CM | POA: Diagnosis not present

## 2020-11-09 NOTE — Patient Instructions (Signed)
Pt is to schedule follow up appointment in two to three weeks and during that time will be evaluating mood, look for pleasurable activities and what is working well for him .  In addition, decrease isolation and engage with others

## 2020-11-09 NOTE — BH Specialist Note (Signed)
Integrated Behavioral Health Initial In-Person Visit  MRN: [id number] Name: Shawn Meza  Number of [place] Clinician visits:: 1/6 Session Start time: 1:15   Session End time: 2:15 Total time: 60 minutes  Types of Service: [place] (BHI)  Interpretor:No. Interpretor Name and Language:     Subjective: Shawn Meza is a 75 y.o. male accompanied by Self Patient was referred by Dr. Wells Guiles [name] for mild depression. Patient reports the following symptoms/concerns: feelings of sadness related to life changes and regarding his disease Parkinson's Duration of problem:6 months  Severity of problem: mild  Objective: Mood: Depressed and Affect: Appropriate Risk of harm to self or others: No plan to harm self or others  Life Context:  Life Changes:Divorced , medical issues dx  Patient and/or Family's Strengths/Protective Factors: Concrete supports in place (healthy food, safe environments, etc.)  Goals Addressed: Patient will: 1. Reduce symptoms of: depression 2. Increase knowledge and/or ability of: coping skills  3. Demonstrate ability to: Increase healthy adjustment to current life circumstances  Progress towards Goals: Ongoing  Interventions: Interventions utilized: Supportive Reflection  Standardized Assessments completed: Not Needed  Patient and/or Family Response: Agreeable to treatment plan of meeting with social worker .  Patient Centered Plan: Patient is on the following Treatment Plan(s):To meet with Social Worker in two weeks and continue with session to improve mood and overall quality of life   Assessment: Patient currently experiencing depressed mood with life changes due to Parkinson's DX and disease . In addition, isolation due to Pandemic and recently being divorced. .   Patient may benefit from Ongoing psychotherapy visit to address his feelings of sadness .  Furthermore, reduce symptoms of depressed mood  and increase feelings of hope and vitality   Plan: 1. Follow up with behavioral health clinician on : In two to three weeks in office  2. Behavioral recommendations:Begin to look at what has been working for him and decrease social isolation.  3. Referral(s): None at this time 4. "From scale of 1-10, how likely are you to follow plan?" Pt reports feeling hopeful and appreciates the visit and will continue . On the scale of 1-10 9  Deetra Booton A Taylor-Paladino, LCSW

## 2020-11-18 ENCOUNTER — Ambulatory Visit (INDEPENDENT_AMBULATORY_CARE_PROVIDER_SITE_OTHER): Payer: Medicare Other | Admitting: Cardiology

## 2020-11-18 ENCOUNTER — Other Ambulatory Visit: Payer: Self-pay

## 2020-11-18 ENCOUNTER — Telehealth: Payer: Self-pay | Admitting: Cardiology

## 2020-11-18 ENCOUNTER — Encounter: Payer: Self-pay | Admitting: Cardiology

## 2020-11-18 VITALS — BP 104/64 | HR 76 | Ht 66.0 in | Wt 205.8 lb

## 2020-11-18 DIAGNOSIS — I5022 Chronic systolic (congestive) heart failure: Secondary | ICD-10-CM | POA: Diagnosis not present

## 2020-11-18 DIAGNOSIS — G20 Parkinson's disease: Secondary | ICD-10-CM | POA: Diagnosis not present

## 2020-11-18 DIAGNOSIS — E782 Mixed hyperlipidemia: Secondary | ICD-10-CM | POA: Diagnosis not present

## 2020-11-18 DIAGNOSIS — I110 Hypertensive heart disease with heart failure: Secondary | ICD-10-CM

## 2020-11-18 DIAGNOSIS — Z789 Other specified health status: Secondary | ICD-10-CM | POA: Diagnosis not present

## 2020-11-18 DIAGNOSIS — I25119 Atherosclerotic heart disease of native coronary artery with unspecified angina pectoris: Secondary | ICD-10-CM | POA: Diagnosis not present

## 2020-11-18 MED ORDER — METOPROLOL TARTRATE 25 MG PO TABS: 25.0000 mg | ORAL_TABLET | Freq: Every day | ORAL | 3 refills | Status: DC

## 2020-11-18 MED ORDER — FUROSEMIDE 40 MG PO TABS: 40.0000 mg | ORAL_TABLET | ORAL | 3 refills | Status: DC

## 2020-11-18 MED ORDER — SACUBITRIL-VALSARTAN 24-26 MG PO TABS: 1.0000 | ORAL_TABLET | Freq: Every day | ORAL | 3 refills | Status: DC

## 2020-11-18 NOTE — Telephone Encounter (Signed)
Pt c/o medication issue:  1. Name of Medication:  sacubitril-valsartan (ENTRESTO) 24-26 MG metoprolol tartrate (LOPRESSOR) 25 MG tablet  2. How are you currently taking this medication (dosage and times per day)? Needs clarification  3. Are you having a reaction (difficulty breathing--STAT)? no  4. What is your medication issue? Lauren from [facility] states the patient's prescriptions were previously 1 tablet twice a day, but they were sent in for 1 table daily. They would like to verify if this is correct.

## 2020-11-18 NOTE — Telephone Encounter (Signed)
Called [facility] Vanita Ingles) and verified dose of medication were decreased to 1 daily as the new RX read.

## 2020-11-18 NOTE — Progress Notes (Signed)
Cardiology Office Note:    Date:  11/18/2020   ID:  Shawn Meza, DOB 1946/07/19, MRN [id number]  PCP:  Shawn Pal, DO  Cardiologist:  Shirlee More, MD    Referring MD: Shawn Meza*    ASSESSMENT:    1. Coronary artery disease involving native coronary artery of native heart with angina pectoris ([place])   2. Chronic systolic (congestive) heart failure ([place])   3. Hypertensive heart disease with heart failure ([place])   4. Mixed hyperlipidemia   5. Statin intolerance   6. Parkinson's disease ([place])    PLAN:    In order of problems listed above:  1. He really is not symptomatic from his heart disease stable CAD no anginal discomfort heart failure compensated blood pressure of anything is relatively low and with his orthostatic hypotension we will decrease his Entresto and beta-blocker to once daily.  Asked him to contact me if any further episodes. 2. Continue low intensity statin with CAD check lipids 3. I advised him to talk to the social worker at his Parkinson's doctor's office he has no family interacting with him he has no power of attorney I think he needs to start making decisions in life   Next appointment: 3 to 4 months   Medication Adjustments/Labs and Tests Ordered: Current medicines are reviewed at length with the patient today.  Concerns regarding medicines are outlined above.  No orders of the defined types were placed in this encounter.  No orders of the defined types were placed in this encounter.   Chief Complaint  Patient presents with  . Follow-up  . Congestive Heart Failure    History of Present Illness:    Shawn Meza is a 75 y.o. male with a hx of CAD with MI and subsequent bypass surgery March 2019 cardiomyopathy with heart failure hypertensive heart disease hyperlipidemia and idiopathic Parkinson's disease with orthostatic hypotension and parkinsons disease  last seen 08/11/2020.  Last ejection fraction 01/22/2019 was 35 to  40% and is declined to consider an ICD in the past. Compliance with diet, lifestyle and medications: Yes  He is not doing well he had COVID-19 stopped taking his medications afterwards a lot of confusion about meds he is socially isolated he is depressed and recently had an increase in his Parkinson medicine at bedtime and he started having anxiety terrifying dreams and he stopped the bedtime preparations.  He also has had 1 episode of hypotension and tells me had a documented blood pressure 70-80 with near syncope. Fortunately no chest pain edema shortness of breath or palpitation. I would not advise an ICD with his comorbidities. Past Medical History:  Diagnosis Date  . Arthritis   . BPH (benign prostatic hypertrophy)   . Chronic coronary artery disease   . Colon cancer ([place])   . Elevated PSA   . Erectile dysfunction   . Essential hypertension   . Essential tremor   . GERD (gastroesophageal reflux disease)   . Headache(784.0)   . Hiatal hernia   . Hypercholesterolemia   . Long-term use of aspirin therapy   . Major depression, chronic   . Medial meniscus tear 10/11/2011  . Metabolic syndrome   . Morbid obesity ([place])   . Nephrolithiasis    hx of  . NSTEMI (non-ST elevated myocardial infarction) ([place])   . Parkinson's disease ([place])   . S/P CABG (coronary artery bypass graft)   . Transient ischemic attack    hx of  . Trochanteric bursitis of  right hip     Past Surgical History:  Procedure Laterality Date  . CARDIAC CATHETERIZATION  5/12,1/13   4 stents placed  . COLON SURGERY    . CORONARY ARTERY BYPASS GRAFT    . KNEE ARTHROSCOPY  10/11/2011   Procedure: ARTHROSCOPY KNEE;  Surgeon: Lorn Junes, MD;  Location: [place];  Service: Orthopedics;  Laterality: Left;  Left Knee Arthroscopy with Medial and Lateral Partial Menisectomy, Chondroplasty  . LEFT HEART CATHETERIZATION WITH CORONARY ANGIOGRAM N/A 08/25/2011   Procedure: LEFT HEART CATHETERIZATION WITH  CORONARY ANGIOGRAM;  Surgeon: Burnell Blanks, MD;  Location[facility] CATH LAB;  Service: Cardiovascular;  Laterality: N/A;  . LITHOTRIPSY    . STERIOD INJECTION  10/11/2011   Procedure: STEROID INJECTION;  Surgeon: Lorn Junes, MD;  Location: [place];  Service: Orthopedics;  Laterality: Right;  Steroid Injection Second Toe  . TRANSURETHRAL RESECTION OF PROSTATE    . URETHRAL DILATION      Current Medications: Current Meds  Medication Sig  . aspirin 81 MG EC tablet Take 1 tablet (81 mg total) by mouth daily.  . carbidopa-levodopa (SINEMET CR) 50-200 MG tablet Take 1 tablet by mouth at bedtime.  . Carbidopa-Levodopa ER (SINEMET CR) 25-100 MG tablet controlled release Take 2 tabs at 8am/ 2 and noon and 1 tab at 4pm  . Cholecalciferol (VITAMIN D-3 PO) Take 1,000 Units by mouth daily with breakfast.  . clopidogrel (PLAVIX) 75 MG tablet TAKE 1 TABLET BY MOUTH ONCE DAILY.  Marland Kitchen ENTRESTO 24-26 MG TAKE ONE TABLET BY MOUTH EVERY MORNING and TAKE ONE TABLET BY MOUTH EVERYDAY AT BEDTIME  . ergocalciferol (VITAMIN D2) 1.25 MG (50000 UT) capsule Take by mouth 2 (two) times a week.  . esomeprazole (NEXIUM) 40 MG capsule Take 1 capsule (40 mg total) by mouth at bedtime.  . ferrous sulfate (SLOW IRON) 160 (50 Fe) MG TBCR SR tablet Take 1 tablet (160 mg total) by mouth every other day.  . fluocinonide-emollient (LIDEX-E) 0.05 % cream Apply 1 application topically 2 (two) times daily. (Patient taking differently: Apply 1 application topically 2 (two) times daily as needed (to affected areas).)  . furosemide (LASIX) 40 MG tablet Take 1 tablet (40 mg total) by mouth daily.  Marland Kitchen gabapentin (NEURONTIN) 100 MG capsule Take 1 capsule (100 mg total) by mouth at bedtime.  . Melatonin 10 MG TABS Take 1 tablet by mouth at bedtime.  . metoprolol tartrate (LOPRESSOR) 25 MG tablet Take 1 tablet (25 mg total) by mouth 2 (two) times daily.  . nitroGLYCERIN (NITROSTAT) 0.4 MG SL tablet Place 1 tablet (0.4  mg total) under the tongue every 5 (five) minutes x 3 doses as needed for chest pain.  . pravastatin (PRAVACHOL) 20 MG tablet Take 1 tablet (20 mg total) by mouth daily.  . prednisoLONE acetate (PRED FORTE) 1 % ophthalmic suspension Place 1 drop into both eyes 2 (two) times daily.  . RESTASIS 0.05 % ophthalmic emulsion Place 1 drop into both eyes 2 (two) times daily.  Marland Kitchen vortioxetine HBr (TRINTELLIX) 5 MG TABS tablet Take 1 tablet (5 mg total) by mouth daily.     Allergies:   Testosterone, Fluoxetine, and Requip [ropinirole]   Social History   Socioeconomic History  . Marital status: Divorced    Spouse name: Not on file  . Number of children: 1  . Years of education: 80  . Highest education level: High school graduate  Occupational History  . 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 Smoking status: Never Smoker  . Smokeless tobacco: Never Used  Vaping Use  . Vaping Use: Never used  Substance and Sexual Activity  . Alcohol use: No  . Drug use: No  . Sexual activity: Not Currently  Other Topics Concern  . Not on file  Social History Narrative   Divorced 2016   Married.   Caffeine use: none       Social Determinants of Radio broadcast assistant Strain: Not on file  Food Insecurity: Not on file  Transportation Needs: Not on file  Physical Activity: Not on file  Stress: Not on file  Social Connections: Not on file     Family History: The patient's family history includes Arthritis in an other family member; Coronary artery disease in an other family member; Healthy in his daughter; Heart disease in his brother, father, and mother; Hyperlipidemia in his brother, father, and another family member; Hypertension in an other family member; Stroke in his father. There is no history of Dementia. ROS:   Please see the history of present illness.    All other systems reviewed and are negative.  EKGs/Labs/Other Studies Reviewed:    The following  studies were reviewed today:   Recent Labs: 01/22/2020: TSH 1.73 03/30/2020: Hemoglobin 15.6; Platelets 189 08/11/2020: ALT 31; BUN 18; Creatinine, Ser 0.93; NT-Pro BNP 551; Potassium 4.5; Sodium 140  Recent Lipid Panel    Component Value Date/Time   CHOL 138 08/11/2020 1419   TRIG 133 08/11/2020 1419   TRIG 107 05/09/2010 0000   HDL 34 (L) 08/11/2020 1419   CHOLHDL 4.1 08/11/2020 1419   CHOLHDL 3.6 05/20/2015 1410   VLDL 27 05/20/2015 1410   LDLCALC 80 08/11/2020 1419    Physical Exam:    VS:  BP 104/64 (BP Location: Right Arm, Patient Position: Sitting)   Pulse 76   Ht 5\' 6"  (1.676 m)   Wt 205 lb 12.8 oz (93.4 kg)   SpO2 96%   BMI 33.22 kg/m     Wt Readings from Last 3 Encounters:  11/18/20 205 lb 12.8 oz (93.4 kg)  11/02/20 206 lb (93.4 kg)  09/10/20 200 lb (90.7 kg)     GEN: Resting tremor typical Parkinson's appearance well nourished, well developed in no acute distress HEENT: Normal NECK: No JVD; No carotid bruits LYMPHATICS: No lymphadenopathy CARDIAC: RRR, no murmurs, rubs, gallops RESPIRATORY:  Clear to auscultation without rales, wheezing or rhonchi  ABDOMEN: Soft, non-tender, non-distended MUSCULOSKELETAL:  No edema; No deformity  SKIN: Warm and dry NEUROLOGIC:  Alert and oriented x 3 PSYCHIATRIC:  Normal affect    Signed, Shirlee More, MD  11/18/2020 1:05 PM    [facility]

## 2020-11-18 NOTE — Patient Instructions (Addendum)
Medication Instructions:  Your physician has recommended you make the following change in your medication:  DECREASE: Entresto 24/26 mg take one tablet by mouth daily.  DECREASE: Metoprolol to once daily.  DECREASE: Furosemide take one tablet by mouth on Monday, Wednesday, and Friday only.  *If you need a refill on your cardiac medications before your next appointment, please call your pharmacy*   Lab Work: Your physician recommends that you return for lab work in: TODAY BMP, ProBNP, Lipids If you have labs (blood work) drawn today and your tests are completely normal, you will receive your results only by: Marland Kitchen MyChart Message (if you have MyChart) OR . A paper copy in the mail If you have any lab test that is abnormal or we need to change your treatment, we will call you to review the results.   Testing/Procedures: None   Follow-Up: At [facility], you and your health needs are our priority.  As part of our continuing mission to provide you with exceptional heart care, we have created designated Provider Care Teams.  These Care Teams include your primary Cardiologist (physician) and Advanced Practice Providers (APPs -  Physician Assistants and Nurse Practitioners) who all work together to provide you with the care you need, when you need it.  We recommend signing up for the patient portal called "MyChart".  Sign up information is provided on this After Visit Summary.  MyChart is used to connect with patients for Virtual Visits (Telemedicine).  Patients are able to view lab/test results, encounter notes, upcoming appointments, etc.  Non-urgent messages can be sent to your provider as well.   To learn more about what you can do with MyChart, go to [email or web address].    Your next appointment:   4 month(s)  The format for your next appointment:   In Person  Provider:   Shirlee More, MD   Other Instructions

## 2020-11-19 ENCOUNTER — Telehealth: Payer: Self-pay

## 2020-11-19 LAB — LIPID PANEL
Chol/HDL Ratio: 4.6 ratio (ref 0.0–5.0)
Cholesterol, Total: 193 mg/dL (ref 100–199)
HDL: 42 mg/dL (ref >39)
LDL Chol Calc (NIH): 125 mg/dL — ABNORMAL HIGH (ref 0–99)
Triglycerides: 148 mg/dL (ref 0–149)
VLDL Cholesterol Cal: 26 mg/dL (ref 5–40)

## 2020-11-19 LAB — BASIC METABOLIC PANEL
BUN/Creatinine Ratio: 18 (ref 10–24)
BUN: 20 mg/dL (ref 8–27)
CO2: 19 mmol/L — ABNORMAL LOW (ref 20–29)
Calcium: 9.6 mg/dL (ref 8.6–10.2)
Chloride: 100 mmol/L (ref 96–106)
Creatinine, Ser: 1.12 mg/dL (ref 0.76–1.27)
Glucose: 104 mg/dL — ABNORMAL HIGH (ref 65–99)
Potassium: 4.1 mmol/L (ref 3.5–5.2)
Sodium: 141 mmol/L (ref 134–144)
eGFR: 68 mL/min/{1.73_m2} (ref >59)

## 2020-11-19 LAB — PRO B NATRIURETIC PEPTIDE: NT-Pro BNP: 320 pg/mL (ref 0–486)

## 2020-11-19 NOTE — Telephone Encounter (Signed)
-----   Message from Richardo Priest, MD sent at 11/19/2020 11:43 AM EDT ----- Stable results no changes

## 2020-11-19 NOTE — Telephone Encounter (Signed)
Spoke with patient regarding results and recommendation.  Patient verbalizes understanding and is agreeable to plan of care. Advised patient to call back with any issues or concerns.  

## 2020-11-23 ENCOUNTER — Other Ambulatory Visit: Payer: Self-pay

## 2020-11-23 ENCOUNTER — Ambulatory Visit: Payer: Medicare Other | Admitting: Licensed Clinical Social Worker

## 2020-11-23 ENCOUNTER — Encounter: Payer: Self-pay | Admitting: Licensed Clinical Social Worker

## 2020-11-23 DIAGNOSIS — F4321 Adjustment disorder with depressed mood: Secondary | ICD-10-CM

## 2020-11-23 NOTE — BH Specialist Note (Signed)
Integrated Behavioral Health Follow Up In-Person Visit  MRN: [id number] Name: Shawn Meza  Number of [place] Clinician visits: 2/6 Session Start time: 1:00  Session End time: 2:00 Total time: 60 minutes  Types of Service: Individual psychotherapy  Interpretor:No. Interpretor Name and Language:   Subjective: DARAN FAVARO is a 75 y.o. male accompanied by Self Patient was referred by Dr. Carles Collet for Depressed Mood Patient reports the following symptoms/concerns: Depressed symptoms  Duration of problem: 6 months Severity of problem: mild  Objective: Mood: NA and Affect: Appropriate Risk of harm to self or others: No plan to harm self or others  Life Context: Family and Social: lives by himself with neighbor and exwife checking on him  School/Work: Retired Self-Care:  Life Changes:  Patient and/or Family's Strengths/Protective Factors: Concrete supports in place (healthy food, safe environments, etc.)  Goals Addressed: Patient will: 1.  Reduce symptoms of: mood instability  2.  Increase knowledge and/or ability of: coping skills  3.  Demonstrate ability to: Increase adequate support systems for patient/family  Progress towards Goals: Ongoing  Interventions: Interventions utilized:  Psychoeducation and/or Health Education and Link to [employer] Standardized Assessments completed: Not Needed  Patient and/or Family Response: Link to community resources of Public affairs consultant and In home health for assistance to help with medication monitoring and decrease social isolation . Pt is receptive to community resources.   Patient Centered Plan: Patient is on the following Treatment Plan(s):To engage in activities that bring enjoyment and decrease isolation  Assessment: Patient currently experiencing depressed mood and social isolation.   Patient may benefit from community resources such as day centers senior   Plan: 1. Follow up with  behavioral health clinician on in 4 weeks. 2. Behavioral recommendations:Make Contact with [employer] and Engage in [place] activities  3. Referral(s): [facility] 4. "From scale of 1-10, how likely are you to follow plan?": 7  Jaquelynn Wanamaker A Taylor-Paladino, LCSW

## 2020-12-09 ENCOUNTER — Other Ambulatory Visit: Payer: Self-pay

## 2020-12-09 MED ORDER — CARBIDOPA-LEVODOPA ER 25-100 MG PO TBCR: EXTENDED_RELEASE_TABLET | ORAL | 0 refills | Status: DC

## 2020-12-09 NOTE — Progress Notes (Signed)
Rx sent to pharmacy   

## 2020-12-21 ENCOUNTER — Other Ambulatory Visit: Payer: Self-pay

## 2020-12-21 ENCOUNTER — Ambulatory Visit: Payer: Medicare Other | Admitting: Licensed Clinical Social Worker

## 2020-12-21 DIAGNOSIS — F330 Major depressive disorder, recurrent, mild: Secondary | ICD-10-CM

## 2020-12-21 NOTE — BH Specialist Note (Signed)
Integrated Behavioral Health Follow Up In-Person Visit  MRN: [id number] Name: Shawn Meza  Number of [place] Clinician visits: 3/6 Session Start time: 2:00  Session End time: 3:00 Total time: 60 minutes  Types of Service: [place] ([facility])  Interpretor:No. Interpretor Name and Language: NA  Subjective: Shawn Meza is a 75 y.o. male accompanied by Self Patient was referred by Dr. Wells Guiles [name] for Depression related to Parkinson. Patient reports the following symptoms/concerns: Helpless, isolated, feelings of sadness  Duration of problem:several months; Severity of problem: mild  Objective: Mood: Hopeless and Affect: Appropriate Risk of harm to self or others: No plan to harm self or others  Life Context: Family and Social: Distant from [employer]tr School/Work: Retired Self-Care: Manages ADL's Life Changes:Chronic Medical Conditions   Patient and/or Family's Strengths/Protective Factors: Concrete supports in place (healthy food, safe environments, etc.)  Goals Addressed: Patient will: 1.  Reduce symptoms of: depression  2.  Increase knowledge and/or ability of: coping skills  3.  Demonstrate ability to: Increase adequate support systems for patient/family  Progress towards Goals: Ongoing  Interventions: Interventions utilized:  Link to [employer] Standardized Assessments completed: Not Needed  Patient and/or Family Response: Pt agreed to utilize community resources to reduce isolation and increase support for chronic medical issues issues including but not limited to [facility], [employer], [employer] [facility] and others .  Patient Centered Plan: Patient is on the following Treatment Plan(s):To contact community resources to increase social access and decrease social isolation and have support for chronic medical conditions .   Assessment: Patient currently experiencing Sadness from failed relationships,  isolations, and changes with getting older and medical issues.    Patient may benefit from being active , assistance in the home, increase socialiazation, decrease isolation, and increase healthy coping skills .  Plan: 1. Follow up with behavioral health clinician on :IFour Weeks 2. Behavioral recommendations: To follow up with community Resources  3. Referral(s): Community resources  4. "From scale of 1-10, how likely are you to follow plan?": 7  Simran Bomkamp A Taylor-Paladino, LCSW

## 2021-01-06 ENCOUNTER — Other Ambulatory Visit: Payer: Self-pay | Admitting: Neurology

## 2021-01-08 DIAGNOSIS — M5416 Radiculopathy, lumbar region: Secondary | ICD-10-CM | POA: Diagnosis not present

## 2021-01-08 DIAGNOSIS — M48061 Spinal stenosis, lumbar region without neurogenic claudication: Secondary | ICD-10-CM | POA: Diagnosis not present

## 2021-01-08 DIAGNOSIS — M545 Low back pain, unspecified: Secondary | ICD-10-CM | POA: Diagnosis not present

## 2021-01-08 DIAGNOSIS — G20 Parkinson's disease: Secondary | ICD-10-CM | POA: Diagnosis not present

## 2021-01-21 ENCOUNTER — Ambulatory Visit: Payer: Medicare Other | Admitting: Licensed Clinical Social Worker

## 2021-02-03 ENCOUNTER — Other Ambulatory Visit: Payer: Self-pay | Admitting: Neurology

## 2021-02-08 DIAGNOSIS — M5416 Radiculopathy, lumbar region: Secondary | ICD-10-CM | POA: Diagnosis not present

## 2021-03-05 ENCOUNTER — Other Ambulatory Visit: Payer: Self-pay | Admitting: Neurology

## 2021-03-08 ENCOUNTER — Other Ambulatory Visit: Payer: Self-pay | Admitting: Family Medicine

## 2021-03-08 MED ORDER — PRAVASTATIN SODIUM 20 MG PO TABS: 20.0000 mg | ORAL_TABLET | Freq: Every day | ORAL | 1 refills | Status: DC

## 2021-03-10 DIAGNOSIS — M545 Low back pain, unspecified: Secondary | ICD-10-CM | POA: Diagnosis not present

## 2021-03-10 DIAGNOSIS — M5416 Radiculopathy, lumbar region: Secondary | ICD-10-CM | POA: Diagnosis not present

## 2021-03-10 DIAGNOSIS — M48061 Spinal stenosis, lumbar region without neurogenic claudication: Secondary | ICD-10-CM | POA: Diagnosis not present

## 2021-03-10 DIAGNOSIS — G20 Parkinson's disease: Secondary | ICD-10-CM | POA: Diagnosis not present

## 2021-03-23 ENCOUNTER — Other Ambulatory Visit: Payer: Self-pay

## 2021-03-24 NOTE — Progress Notes (Signed)
Cardiology Office Note:    Date:  03/25/2021   ID:  Shawn Meza, DOB 09-11-1945, MRN [id number]  PCP:  [name], DO  Cardiologist:  Shirlee More, MD    Referring MD: [name]*    ASSESSMENT:    1. Coronary artery disease involving native coronary artery of native heart with angina pectoris ([place])   2. Chronic systolic (congestive) heart failure ([place])   3. Hypertensive heart disease with heart failure ([place])   4. Mixed hyperlipidemia   5. Statin intolerance   6. Parkinson's disease ([place])   7. Orthostatic hypotension    PLAN:    In order of problems listed above:  Cardiology perspective he is doing well stable CAD no angina after CABG no current medical treatment continue his aspirin and low intensity statin. Stable compensated he has no edema continue his current loop diuretic along with beta-blocker minimum dose of Entresto with care to avoid hypotension Continue with statin check lipid profile CMP Unfortunately his Parkinson's disease is progressing and has caused a profound change in his life and overall wellbeing. No recurrent orthostatic hypotension, no Parkinson's disease   Next appointment: 6 months   Medication Adjustments/Labs and Tests Ordered: Current medicines are reviewed at length with the patient today.  Concerns regarding medicines are outlined above.  Orders Placed This Encounter  Procedures   Comp Met (CMET)   Lipid Profile   EKG 12-Lead   No orders of the defined types were placed in this encounter.   Chief Complaint  Patient presents with   Follow-up   Hypotension   Coronary Artery Disease   Congestive Heart Failure    History of Present Illness:    CAD with MI and subsequent bypass surgery March 2019 cardiomyopathy with heart failure hypertensive heart disease hyperlipidemia and idiopathic Parkinson's disease with orthostatic hypotension and parkinsons disease  last seen 11/18/2020.  Last ejection fraction  01/22/2019 was 35 to 40% and is declined to consider an ICD in the past.  Compliance with diet, lifestyle and medications: Yes  Unfortunately socially isolated and progressively limited by his Parkinson's disease. He has had no further symptomatic hypotension No edema shortness of breath chest pain palpitation or syncope. 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   Diarrhea 11/04/2020   Elevated PSA    Erectile dysfunction    Essential hypertension    Essential tremor    GERD (gastroesophageal reflux disease)    Headache(784.0)    Hiatal hernia    Hypercholesterolemia    Long-term use of aspirin therapy    Low back pain 10/28/2019   Major depression, chronic    Medial meniscus tear 1/95/0932   Metabolic syndrome    Morbid obesity ([place])    Myofascial pain 12/20/2019   Nephrolithiasis    hx of   NSTEMI (non-ST elevated myocardial infarction) ([place])    Parkinson's disease ([place])    S/P CABG (coronary artery bypass graft)    Transient ischemic attack    hx of   Trochanteric bursitis of right hip     Past Surgical History:  Procedure Laterality Date   CARDIAC CATHETERIZATION  5/12,1/13   4 stents placed   COLON SURGERY     CORONARY ARTERY BYPASS GRAFT     KNEE ARTHROSCOPY  10/11/2011   Procedure: ARTHROSCOPY KNEE;  Surgeon:  Lorn Junes, MD;  Location: [facility];  Service: Orthopedics;  Laterality: Left;  Left Knee Arthroscopy with Medial and Lateral Partial Menisectomy, Chondroplasty   LEFT HEART CATHETERIZATION WITH CORONARY ANGIOGRAM N/A 08/25/2011   Procedure: LEFT HEART CATHETERIZATION WITH CORONARY ANGIOGRAM;  Surgeon: Burnell Blanks, MD;  Location: [facility] CATH LAB;  Service: Cardiovascular;  Laterality: N/A;   LITHOTRIPSY     STERIOD INJECTION  10/11/2011   Procedure: STEROID INJECTION;  Surgeon:  Lorn Junes, MD;  Location: [facility];  Service: Orthopedics;  Laterality: Right;  Steroid Injection Second Toe   TRANSURETHRAL RESECTION OF PROSTATE     URETHRAL DILATION      Current Medications: Current Meds  Medication Sig   aspirin 81 MG EC tablet Take 1 tablet (81 mg total) by mouth daily.   carbidopa-levodopa (SINEMET CR) 50-200 MG tablet Take 1 tablet by mouth at bedtime.   Carbidopa-Levodopa ER (SINEMET CR) 25-100 MG tablet controlled release TAKE TWO TABLETS BY MOUTH EVERY MORNING and TAKE TWO TABLETS BY MOUTH AT NOON and TAKE ONE TABLET BY MOUTH EVERY EVENING   Cholecalciferol (D3-1000) 25 MCG (1000 UT) capsule Take 1,000 Units by mouth daily.   esomeprazole (NEXIUM) 40 MG capsule Take 1 capsule (40 mg total) by mouth at bedtime.   FLOVENT HFA 110 MCG/ACT inhaler Inhale 1 puff into the lungs daily as needed (wheezing, shortness of breath).   fluocinonide-emollient (LIDEX-E) 0.05 % cream Apply 1 application topically 2 (two) times daily. (Patient taking differently: Apply 1 application topically 2 (two) times daily as needed (to affected areas).)   furosemide (LASIX) 40 MG tablet Take 1 tablet (40 mg total) by mouth 3 (three) times a week.   gabapentin (NEURONTIN) 100 MG capsule Take 1 capsule (100 mg total) by mouth at bedtime.   metoprolol tartrate (LOPRESSOR) 25 MG tablet Take 1 tablet (25 mg total) by mouth daily.   Multiple Vitamin (MULTI-VITAMIN) tablet Take 1 tablet by mouth daily.   nitroGLYCERIN (NITROSTAT) 0.4 MG SL tablet Place 1 tablet (0.4 mg total) under the tongue every 5 (five) minutes x 3 doses as needed for chest pain.   pravastatin (PRAVACHOL) 20 MG tablet Take 1 tablet (20 mg total) by mouth daily.   prednisoLONE acetate (PRED FORTE) 1 % ophthalmic suspension Place 1 drop into both eyes 2 (two) times daily.   sacubitril-valsartan (ENTRESTO) 24-26 MG Take 1 tablet by mouth daily.   vortioxetine HBr (TRINTELLIX) 5 MG TABS tablet Take 1 tablet (5  mg total) by mouth daily.   [DISCONTINUED] clopidogrel (PLAVIX) 75 MG tablet TAKE 1 TABLET BY MOUTH ONCE DAILY.     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   Caffeine use: none       Social Determinants of Radio broadcast assistant  Strain: Not on file  Food Insecurity: Not on file  Transportation Needs: Not on file  Physical Activity: Not on file  Stress: Not on file  Social Connections: Not on file     Family History: The patient's family history includes Arthritis in an other family member; Coronary artery disease in an other family member; Healthy in his daughter; Heart disease in his brother, father, and mother; Hyperlipidemia in his brother, father, and another family member; Hypertension in an other family member; Stroke in his father. There is no history of Dementia. ROS:   Please see the history of present illness.    All other systems reviewed and are negative.  EKGs/Labs/Other Studies Reviewed:    The following studies were reviewed today:   Recent Labs: 03/30/2020: Hemoglobin 15.6; Platelets 189 08/11/2020: ALT 31 11/18/2020: BUN 20; Creatinine, Ser 1.12; NT-Pro BNP 320; Potassium 4.1; Sodium 141  Recent Lipid Panel    Component Value Date/Time   CHOL 193 11/18/2020 1326   TRIG 148 11/18/2020 1326   TRIG 107 05/09/2010 0000   HDL 42 11/18/2020 1326   CHOLHDL 4.6 11/18/2020  1326   CHOLHDL 3.6 05/20/2015 1410   VLDL 27 05/20/2015 1410   LDLCALC 125 (H) 11/18/2020 1326    Physical Exam:    VS:  BP 134/78 (BP Location: Right Arm, Patient Position: Sitting)   Pulse 73   Ht '5\' 6"'  (1.676 m)   Wt 208 lb 12.8 oz (94.7 kg)   SpO2 96%   BMI 33.70 kg/m     Wt Readings from Last 3 Encounters:  03/25/21 208 lb 12.8 oz (94.7 kg)  11/18/20 205 lb 12.8 oz (93.4 kg)  11/02/20 206 lb (93.4 kg)     GEN: He has a typical appearance of Parkinson's tremor masked face well nourished, well developed in no acute distress HEENT: Normal NECK: No JVD; No carotid bruits LYMPHATICS: No lymphadenopathy CARDIAC: RRR, no murmurs, rubs, gallops RESPIRATORY:  Clear to auscultation without rales, wheezing or rhonchi  ABDOMEN: Soft, non-tender, non-distended MUSCULOSKELETAL:  No edema; No deformity  SKIN: Warm and dry NEUROLOGIC:  Alert and oriented x 3 PSYCHIATRIC:  Normal affect    Signed, Shirlee More, MD  03/25/2021 4:57 PM    [facility]

## 2021-03-25 ENCOUNTER — Other Ambulatory Visit: Payer: Self-pay

## 2021-03-25 ENCOUNTER — Ambulatory Visit (INDEPENDENT_AMBULATORY_CARE_PROVIDER_SITE_OTHER): Payer: Medicare Other | Admitting: Cardiology

## 2021-03-25 ENCOUNTER — Encounter: Payer: Self-pay | Admitting: Cardiology

## 2021-03-25 VITALS — BP 134/78 | HR 73 | Ht 66.0 in | Wt 208.8 lb

## 2021-03-25 DIAGNOSIS — I951 Orthostatic hypotension: Secondary | ICD-10-CM | POA: Diagnosis not present

## 2021-03-25 DIAGNOSIS — I25119 Atherosclerotic heart disease of native coronary artery with unspecified angina pectoris: Secondary | ICD-10-CM

## 2021-03-25 DIAGNOSIS — I5022 Chronic systolic (congestive) heart failure: Secondary | ICD-10-CM | POA: Diagnosis not present

## 2021-03-25 DIAGNOSIS — I110 Hypertensive heart disease with heart failure: Secondary | ICD-10-CM

## 2021-03-25 DIAGNOSIS — G20 Parkinson's disease: Secondary | ICD-10-CM | POA: Diagnosis not present

## 2021-03-25 DIAGNOSIS — Z789 Other specified health status: Secondary | ICD-10-CM | POA: Diagnosis not present

## 2021-03-25 DIAGNOSIS — E782 Mixed hyperlipidemia: Secondary | ICD-10-CM

## 2021-03-25 NOTE — Patient Instructions (Signed)
Medication Instructions:   STOP TAKING CLOPIDOGREL(PLAVIX)   *If you need a refill on your cardiac medications before your next appointment, please call your pharmacy*   Lab Work:  [place]    If you have labs (blood work) drawn today and your tests are completely normal, you will receive your results only by: [place] (if you have MyChart) OR A paper copy in the mail If you have any lab test that is abnormal or we need to change your treatment, we will call you to review the results.   Testing/Procedures: NONE ORDERED  TODAY   Follow-Up: At [facility], you and your health needs are our priority.  As part of our continuing mission to provide you with exceptional heart care, we have created designated Provider Care Teams.  These Care Teams include your primary Cardiologist (physician) and Advanced Practice Providers (APPs -  Physician Assistants and Nurse Practitioners) who all work together to provide you with the care you need, when you need it.  We recommend signing up for the patient portal called "MyChart".  Sign up information is provided on this After Visit Summary.  MyChart is used to connect with patients for Virtual Visits (Telemedicine).  Patients are able to view lab/test results, encounter notes, upcoming appointments, etc.  Non-urgent messages can be sent to your provider as well.   To learn more about what you can do with MyChart, go to [email or web address].    Your next appointment:   6 month(s)  The format for your next appointment:   In Person  Provider:   Shirlee More, MD   Other Instructions

## 2021-03-26 LAB — COMPREHENSIVE METABOLIC PANEL
ALT: 20 IU/L (ref 0–44)
AST: 25 IU/L (ref 0–40)
Albumin/Globulin Ratio: 1.6 (ref 1.2–2.2)
Albumin: 4.2 g/dL (ref 3.7–4.7)
Alkaline Phosphatase: 71 IU/L (ref 44–121)
BUN/Creatinine Ratio: 16 (ref 10–24)
BUN: 15 mg/dL (ref 8–27)
Bilirubin Total: 0.3 mg/dL (ref 0.0–1.2)
CO2: 20 mmol/L (ref 20–29)
Calcium: 9 mg/dL (ref 8.6–10.2)
Chloride: 106 mmol/L (ref 96–106)
Creatinine, Ser: 0.93 mg/dL (ref 0.76–1.27)
Globulin, Total: 2.6 g/dL (ref 1.5–4.5)
Glucose: 101 mg/dL — ABNORMAL HIGH (ref 65–99)
Potassium: 4.5 mmol/L (ref 3.5–5.2)
Sodium: 142 mmol/L (ref 134–144)
Total Protein: 6.8 g/dL (ref 6.0–8.5)
eGFR: 85 mL/min/{1.73_m2} (ref >59)

## 2021-03-26 LAB — LIPID PANEL
Chol/HDL Ratio: 5 ratio (ref 0.0–5.0)
Cholesterol, Total: 195 mg/dL (ref 100–199)
HDL: 39 mg/dL — ABNORMAL LOW (ref >39)
LDL Chol Calc (NIH): 125 mg/dL — ABNORMAL HIGH (ref 0–99)
Triglycerides: 174 mg/dL — ABNORMAL HIGH (ref 0–149)
VLDL Cholesterol Cal: 31 mg/dL (ref 5–40)

## 2021-04-04 ENCOUNTER — Ambulatory Visit: Payer: Medicare Other

## 2021-04-06 ENCOUNTER — Other Ambulatory Visit: Payer: Self-pay | Admitting: Neurology

## 2021-04-07 NOTE — Progress Notes (Signed)
Assessment/Plan:   1.  Parkinsons Disease  -Continue carbidopa/levodopa 25/100 CR, 2 tablets at 8 AM/2 tablets at noon/1 tablet at 4 PM  -Continue carbidopa/levodopa 50/200 CR at bedtime.  I do think that that has helped nighttime anxiety.  2.  Depression  -On Trintellix.  Managed by primary care.  Declines psychiatry.  I still think that this is a big issue in patient's life.  3.  B12 deficiency  -On supplementation.  4.  Lumbar spinal stenosis  -Following with Dr. Vertell Limber.  States that Dr. Melven Sartorius office had told him they would call him to schedule injections but never did and he does not know how to get ahold of the office.  Phone number was given to patient.  5.  Insomnia  -Continue melatonin to 3 mg. Subjective:   Shawn Meza was seen today in follow up for Parkinsons disease.  My previous records were reviewed prior to todays visit as well as outside records available to me.  No falls since last visit.  No lightheadedness or near syncope.  Last visit, we added levodopa at bedtime to see if that would help with nighttime anxiety.  We did tell him that if it did not help, he will need to call primary care, as they were managing his anxiety/depression.  I asked him specifically about q hs anxiety and he doesn't c/o that.    He did see our LCSW a few times for counseling since last visit.  His dog just died on 11-08-22.    Current prescribed movement disorder medications: Carbidopa/levodopa 25/100 CR, 2 at 8 AM/2 at noon/1 at 4 PM  Carbidopa/levodopa 50/200 CR at bedtime (added last visit) B12 supplement Melatonin, 3 mg   ALLERGIES:   Allergies  Allergen Reactions   Testosterone Other (See Comments)    ABDOMINAL PAIN and cramping   Fluoxetine Other (See Comments)    Caused depression and aggression   Requip [Ropinirole] Nausea Only    CURRENT MEDICATIONS:  Outpatient Encounter Medications as of 04/08/2021  Medication Sig   aspirin 81 MG EC tablet Take 1 tablet (81 mg  total) by mouth daily.   carbidopa-levodopa (SINEMET CR) 50-200 MG tablet Take 1 tablet by mouth at bedtime.   Carbidopa-Levodopa ER (SINEMET CR) 25-100 MG tablet controlled release TAKE TWO TABLETS BY MOUTH EVERY MORNING and TAKE TWO TABLETS BY MOUTH AT NOON and TAKE ONE TABLET BY MOUTH EVERY EVENING   Cholecalciferol (D3-1000) 25 MCG (1000 UT) capsule Take 1,000 Units by mouth daily.   esomeprazole (NEXIUM) 40 MG capsule Take 1 capsule (40 mg total) by mouth at bedtime.   FLOVENT HFA 110 MCG/ACT inhaler Inhale 1 puff into the lungs daily as needed (wheezing, shortness of breath).   fluocinonide-emollient (LIDEX-E) 0.05 % cream Apply 1 application topically 2 (two) times daily. (Patient taking differently: Apply 1 application topically 2 (two) times daily as needed (to affected areas).)   furosemide (LASIX) 40 MG tablet Take 1 tablet (40 mg total) by mouth 3 (three) times a week.   gabapentin (NEURONTIN) 100 MG capsule Take 1 capsule (100 mg total) by mouth at bedtime.   metoprolol tartrate (LOPRESSOR) 25 MG tablet Take 1 tablet (25 mg total) by mouth daily.   Multiple Vitamin (MULTI-VITAMIN) tablet Take 1 tablet by mouth daily.   nitroGLYCERIN (NITROSTAT) 0.4 MG SL tablet Place 1 tablet (0.4 mg total) under the tongue every 5 (five) minutes x 3 doses as needed for chest pain.   pravastatin (PRAVACHOL) 20  MG tablet Take 1 tablet (20 mg total) by mouth daily.   prednisoLONE acetate (PRED FORTE) 1 % ophthalmic suspension Place 1 drop into both eyes 2 (two) times daily.   sacubitril-valsartan (ENTRESTO) 24-26 MG Take 1 tablet by mouth daily.   vortioxetine HBr (TRINTELLIX) 5 MG TABS tablet Take 1 tablet (5 mg total) by mouth daily.   No facility-administered encounter medications on file as of 04/08/2021.    Objective:   PHYSICAL EXAMINATION:    VITALS:   Vitals:   04/08/21 1517  BP: 132/65  Weight: 210 lb (95.3 kg)  Height: '5\' 5"'$  (1.651 m)     GEN:  The patient appears stated age and is  in NAD. HEENT:  Normocephalic, atraumatic.  The mucous membranes are moist. The superficial temporal arteries are without ropiness or tenderness. CV:  RRR Lungs:  CTAB Neck/HEME:  There are no carotid bruits bilaterally.  Neurological examination:  Orientation: The patient is alert and oriented x3. Cranial nerves: There is good facial symmetry with facial hypomimia. The speech is fluent and clear. Soft palate rises symmetrically and there is no tongue deviation. Hearing is intact to conversational tone. Sensation: Sensation is intact to light touch throughout Motor: Strength is at least antigravity x4.    Movement examination: Tone: There is nl increased tone in the LUE/LLE Abnormal movements: there is LUE/LLE rest tremor Coordination:  There is min decremation with RAM's, mostly with foot taps on the left Gait and Station: The patient has mild difficulty arising out of a deep-seated chair without the use of the hands. The patient's stride length is good with slight decreased arm swing on the L.    I have reviewed and interpreted the following labs independently    Chemistry      Component Value Date/Time   NA 142 03/25/2021 1644   K 4.5 03/25/2021 1644   CL 106 03/25/2021 1644   CO2 20 03/25/2021 1644   BUN 15 03/25/2021 1644   CREATININE 0.93 03/25/2021 1644   CREATININE 1.03 05/20/2015 1410      Component Value Date/Time   CALCIUM 9.0 03/25/2021 1644   ALKPHOS 71 03/25/2021 1644   AST 25 03/25/2021 1644   ALT 20 03/25/2021 1644   BILITOT 0.3 03/25/2021 1644       Lab Results  Component Value Date   WBC 9.6 03/30/2020   HGB 15.6 03/30/2020   HCT 46.0 03/30/2020   MCV 98.4 03/30/2020   PLT 189 03/30/2020    Lab Results  Component Value Date   TSH 1.73 01/22/2020     Total time spent on today's visit was 32 minutes, including both face-to-face time and nonface-to-face time.  Time included that spent on review of records (prior notes available to  me/labs/imaging if pertinent), discussing treatment and goals, answering patient's questions and coordinating care.  Cc:  [name], DO

## 2021-04-08 ENCOUNTER — Other Ambulatory Visit: Payer: Self-pay

## 2021-04-08 ENCOUNTER — Ambulatory Visit (INDEPENDENT_AMBULATORY_CARE_PROVIDER_SITE_OTHER): Payer: Medicare Other | Admitting: Neurology

## 2021-04-08 VITALS — BP 132/65 | Ht 65.0 in | Wt 210.0 lb

## 2021-04-08 DIAGNOSIS — G20 Parkinson's disease: Secondary | ICD-10-CM

## 2021-04-08 DIAGNOSIS — F330 Major depressive disorder, recurrent, mild: Secondary | ICD-10-CM

## 2021-04-08 MED ORDER — CARBIDOPA-LEVODOPA ER 25-100 MG PO TBCR: EXTENDED_RELEASE_TABLET | ORAL | 1 refills | Status: DC

## 2021-04-08 MED ORDER — CARBIDOPA-LEVODOPA ER 50-200 MG PO TBCR
1.0000 | EXTENDED_RELEASE_TABLET | Freq: Every day | ORAL | 1 refills | Status: DC
Start: 2021-04-08 — End: 2021-10-08

## 2021-04-08 NOTE — Patient Instructions (Addendum)
Dr. Vertell Limber Address: [address], [place], [place] [address] Hours:  Phone: [phone]  Online Resources for Power over Parkinson's Group September 2022  [place] over Parkinson's Group :   Power Over Parkinson's Patient Education Group will be Wednesday, September 14th-*Hybrid meting*- in person at [facility] location and via [facility] at 2:00 pm.   Upcoming Power over [employer] Meetings:  2nd Wednesdays of the month at 2 pm:  August 10th, September 14th Contact Amy Marriott at [email or web address] if interested in participating in this online group Parkinson's Care Partners Group:    3rd Mondays, Contact Misty Paladino Atypical Parkinsonian Patient Group:   4th Wednesdays, [place] If you are interested in participating in these online groups with Misty, please contact her directly for how to join those meetings.  Her contact information is [email or web address].   [place]:  www.[email or web address] PD Health at Home continues:  Mindfulness Mondays, Expert Briefing Tuesdays, Wellness Wednesdays, Take Time Thursdays, Fitness Fridays -Listings for June 2022 are on the website Upcoming Webinar:  Use it or Lose It-The Impact of Physical Activity in Parkinson's.  Wednesday, September 7th at 1 pm. Upcoming Webinar:  Understanding Gene and Cell-Based Therapies in Parkinson's.  Wednesday, October 5th at 1 pm Register for expert briefings (webinars) at [email or web address]  Please check out their website to sign up for emails and see their full online offerings  [place]:  [email or web address]  Upcoming Webinar:   Finding your Way:  Working through Emotions in the Early Years with Parkinson's.  Thursday, September 15th at 12 noon Check out additional information on their website to see their full online offerings  [place]:  [email or web address] Upcoming Webinar:  Pelvic Health and Living with Parkinson's.  Tuesday, September 20th at 3 pm.  Care Partner Monthly Meetup.  With Robin Searing Phinney.  First Tuesday of each month, 2 pm Joy Breaks:  First Wednesday of each month, 2-3 pm. There will be art, doodling, making, crafting, listening, laughing, stories, and everything in between. No art experience necessary. No supplies required. Just show up for joy!  Register on their website. Check out additional information to Live Well Today on their website  Parkinson and Movement Disorders (PMD) Alliance:  www.pmdalliance.org NeuroLife Online:  Online Education Events Sign up for emails, which are sent weekly to give you updates on programming and online offerings  Parkinson's Association of the [place]:  www.[email or web address] Caring for Parkinson's, Caring for You Symposium, Saturday, September 10th.  [place], [place].  Symposium Registration - Parkinson Association of the [place] Information on online support groups, education events, and online exercises including Yoga, Parkinson's exercises and more-LOTS of information on links to PD resources and online events Virtual Support Group through Parkinson's Association of the [place]; next one is scheduled for Wednesday, October 5th at 2 pm. No September support group due to symposium.  (These are typically scheduled for the 1st Wednesday of the month at 2 pm).  Visit website for details.  Additional links for movement activities: Parkinson's DRUMMING Classes/Music Therapy with Doylene Canning:  This is a returning class!  2nd Mondays, beginning September 12th.  Contact Doylene Canning at [phone] or [email or web address] PWR! Moves Classes at [place] RESUMED!  Wednesdays 10 and 11 am.  Contact Amy Marriott, PT [email or web address] or [phone] if interested Here is a link to the PWR!Moves classes on  Zoom from [place] - Daily Mon-Sat at 10:00. Via Zoom, FREE and  open to all.  There is also a link below via Facebook if you use that platform. [email or web address] Parkinson's Wellness Recovery (PWR! Moves)  www.[email or web address] Info on the PWR! Virtual Experience:  You will have access to our expertise through self-assessment, guided plans that start with the PD-specific fundamentals, educational content, tips, Q&A with an expert, and a growing Art therapist of PD-specific pre-recorded and live exercise classes of varying types and intensity - both physical and cognitive! If that is not enough, we offer 1:1 wellness consultations (in-person or virtual) to personalize your PWR! Research scientist (medical).  [place] Fridays:  As part of the PD Health @ Home program, this free video series focuses each week on one aspect of fitness designed to support people living with Parkinson's.  These weekly videos highlight the [place] recent fitness guidelines for people with Parkinson's disease.  HollywoodSale.dk Dance for PD website is offering free, live-stream classes throughout the week, as well as links to [employer] of classes:  [email or web address]/ Dance for Parkinson's Class:  Mark.  Free offering for people with Parkinson's and care partners; virtual class.  For more information, contact [phone] or email Ruffin Frederick at [email or web address] Virtual dance and Pilates for Parkinson's classes: Click on the Community Tab> Parkinson's Movement Initiative Tab.  To register for classes and for more information, visit [email or web address] and click  the "community" tab.  [employer] Parkinson's Cycling Classes  [employer]: 1pm on Fridays-Live classes at [employer] ([employer] at [name] or [phone]) [employer]: Virtual Classes Mondays and Thursdays Jeanette Caprice classes Tuesday, Wednesday and Thursday (contact [place] at [email or web address]  or [phone])  [facility] Boxing Three levels of classes are offered Tuesdays and Thursdays:  10:30 am,  12 noon & 1:45 pm at [facility].  Active Stretching with Paula Compton Class starting in March, on Fridays To observe a class or for  more information, call [phone] or email [email or web address] [employer]: Online Caregiver Education Opportunities:  www.well-[email or web address]/caregiver-education/caregiver-support-group.  You may also contact Vickki Muff at [email or web address] or [phone].   Powerful Tools for Caregivers:  6-week program beginning Thursday, October 13th.  This six-week educational series designed to provide family caregivers with practical tools to care for themselves while caring for a loved one Unlocking Dementia through [facility], Humor, and Understanding:  5-week series beginning September 7th Well-Spring Navigator:  10-06-1999 program, a free service to help individuals and families through the journey of determining care for older adults.  The "Navigator" is a [employer], Education officer, museum, who will speak with a prospective client and/or loved ones to provide an assessment of the situation and a set of recommendations for a personalized care plan -- all free of charge, and whether [employer] offers the needed service or not. If the need is not a service we provide, we are well-connected with reputable programs in town that we can refer you to.  www.well-[email or web address] or to speak with the Navigator, call [phone].

## 2021-04-11 IMAGING — MR MR HEAD W/O CM
14 of 18 series · 30 of 48 positions shown · non-contrast
Comparison: Noncontrast head CT performed earlier the same day
03/30/2020. MRI/MRA head 05/10/2010.

CLINICAL DATA: Neuro deficit, acute, stroke suspected. Additional
history provided: Sudden onset blurred vision greater on the left.

EXAM:
MRI HEAD WITHOUT CONTRAST
MRA HEAD WITHOUT CONTRAST
TECHNIQUE: Multiplanar, multiecho pulse sequences of the brain and surrounding
structures were obtained without intravenous contrast. Angiographic
images of the head were obtained using MRA technique without
contrast.

[Series 5: DWI · axial · 3.0mm · 1.09mm/px · z∈[-39,+122]mm · 7 of 110 slices shown (1 of 4)]
[im 1/110]
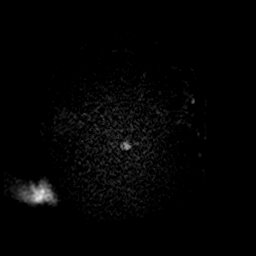
[im 19/110]
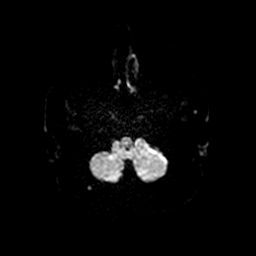
[im 37/110]
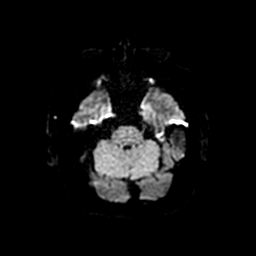
[im 55/110]
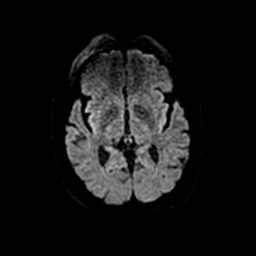
[im 73/110]
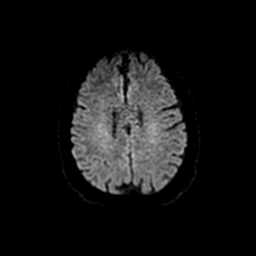
[im 91/110]
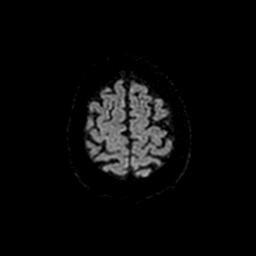
[im 110/110]
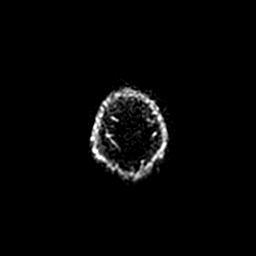

[Series 6: DWI · coronal · 5.0mm · 1.09mm/px · 6 of 82 slices shown (2 of 4)]
[im 1/82]
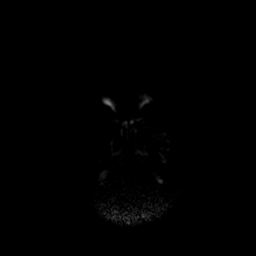
[im 17/82]
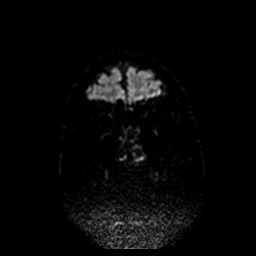
[im 33/82]
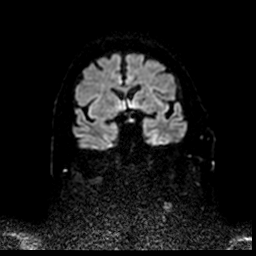
[im 49/82]
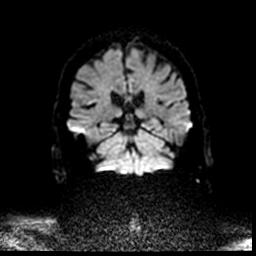
[im 65/82]
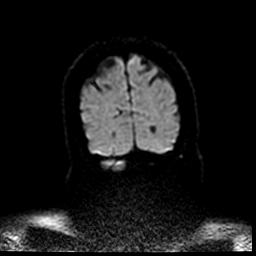
[im 82/82]
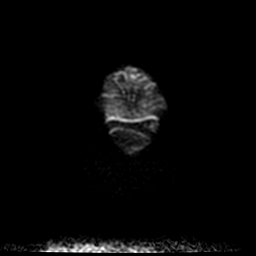

[Series 7: T1 · sagittal · 5.0mm · 0.47mm/px · 1 of 22 slices shown (1 of 3)]
[im 1/22]
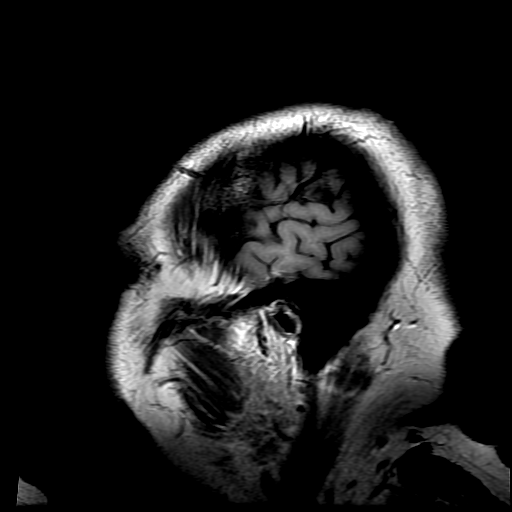

[Series 8: FLAIR · axial · 3.0mm · 0.45mm/px · 1 of 26 slices shown]
[im 1/26]
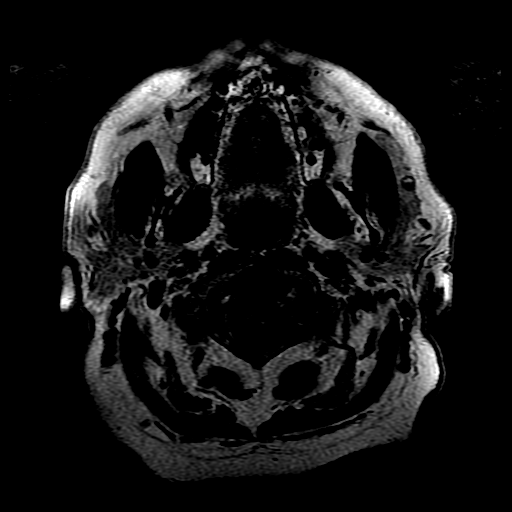

[Series 9: T1 · axial · 3.0mm · 0.35mm/px · 1 of 22 slices shown (2 of 3)]
[im 1/22]
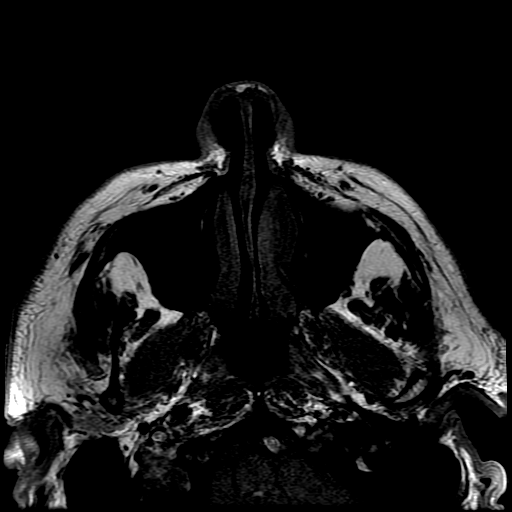

[Series 10: T2 fat-sat · axial · 3.0mm · 0.35mm/px · 1 of 22 slices shown (1 of 2)]
[im 1/22]
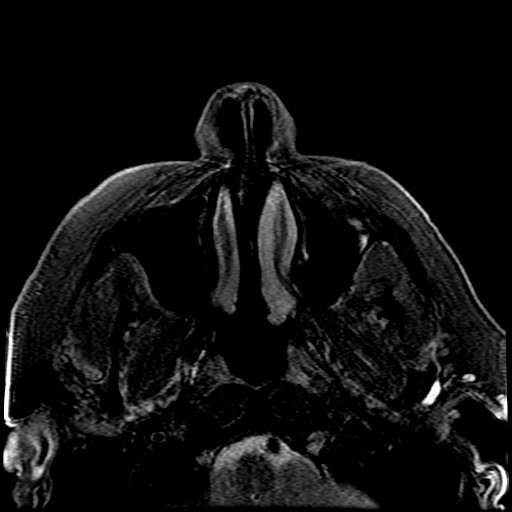

[Series 11: T1 · coronal · 3.0mm · 0.35mm/px · 1 of 41 slices shown (3 of 3)]
[im 1/41]
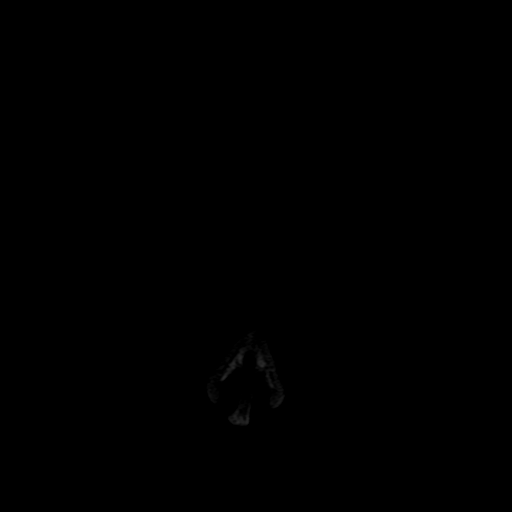

[Series 12: T2 fat-sat · coronal · 3.0mm · 0.35mm/px · 2 of 41 slices shown (2 of 2)]
[im 1/41]
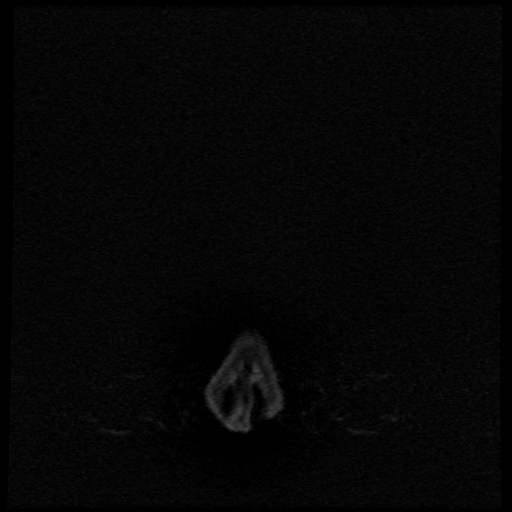
[im 41/41]
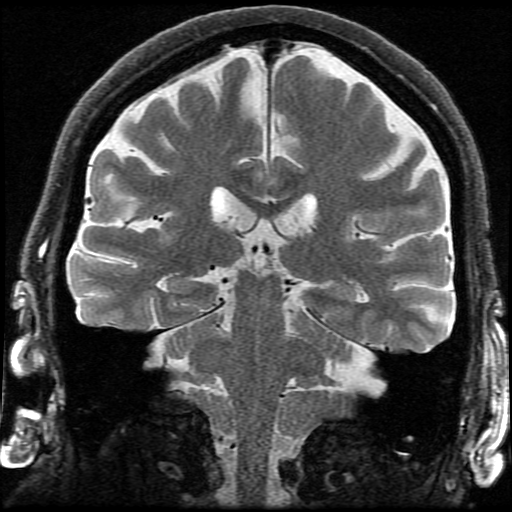

[Series 13: T2 · axial · 5.0mm · 0.45mm/px · 1 of 26 slices shown (1 of 2)]
[im 1/26]
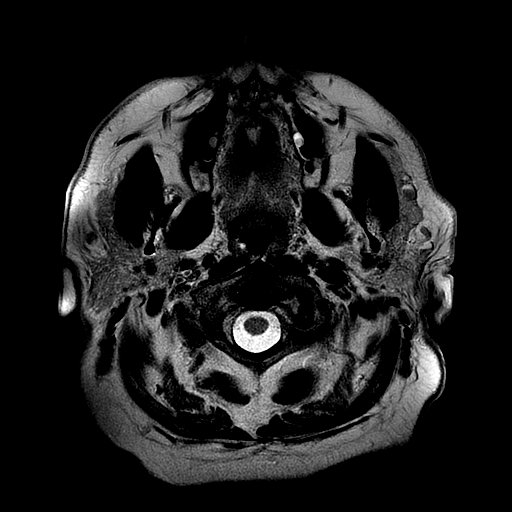

[Series 16: T2 · coronal · 5.0mm · 0.39mm/px · 1 of 25 slices shown (2 of 2)]
[im 1/25]
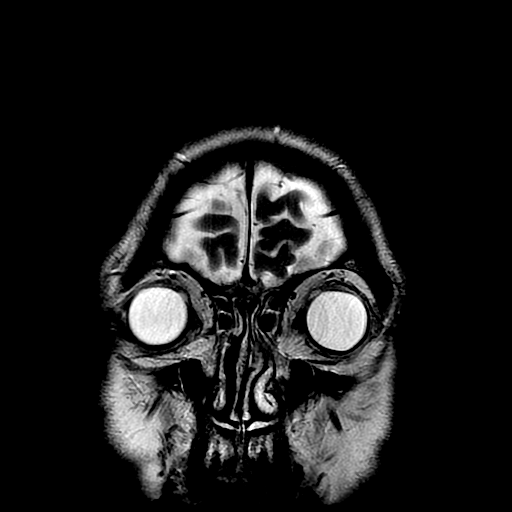

[Series 18: T1 post-contrast · axial · 3.0mm · 0.35mm/px · 1 of 22 slices shown (1 of 2)]
[im 1/22]
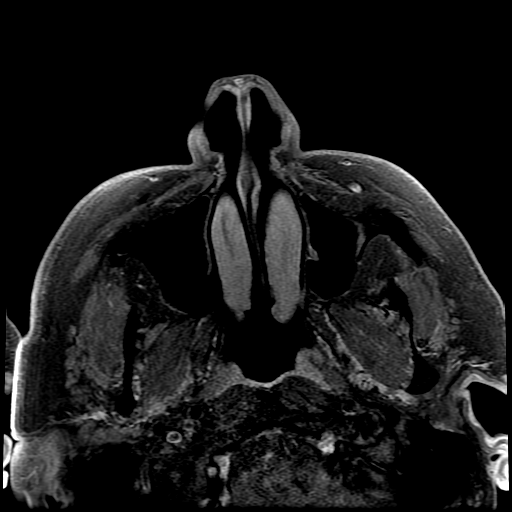

[Series 19: T1 post-contrast · coronal · 3.0mm · 0.35mm/px · 2 of 41 slices shown (2 of 2)]
[im 1/41]
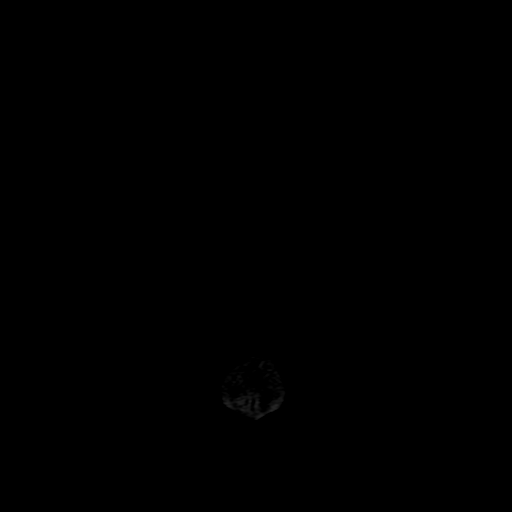
[im 41/41]
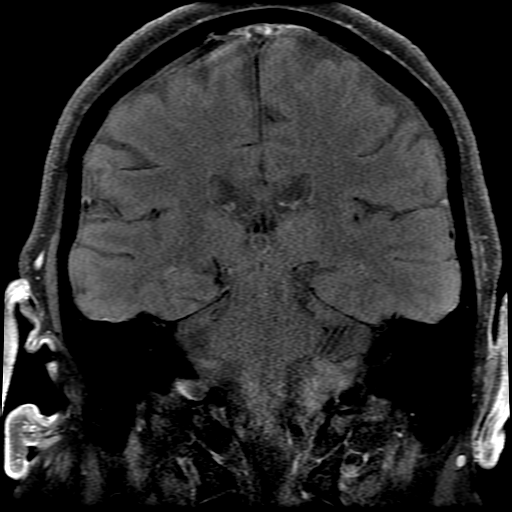

[Series 500: DWI · axial · 3.0mm · 1.09mm/px · z∈[-39,+122]mm · 3 of 55 slices shown (3 of 4)]
[im 1/55]
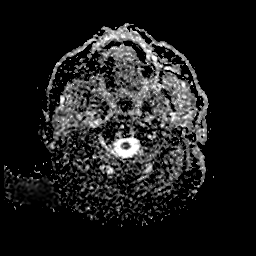
[im 28/55]
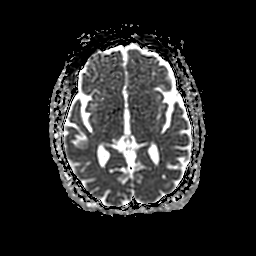
[im 55/55]
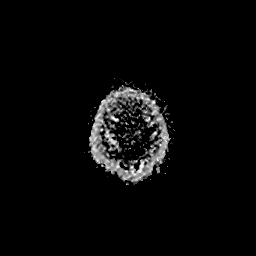

[Series 600: DWI · coronal · 5.0mm · 1.09mm/px · 2 of 41 slices shown (4 of 4)]
[im 1/41]
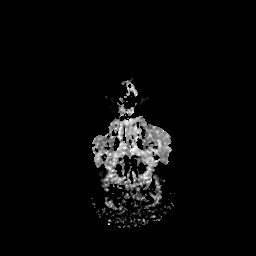
[im 41/41]
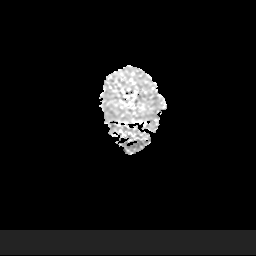

[30 of 48 positions shown; findings below may reference images not displayed]

FINDINGS: MRI HEAD FINDINGS

Brain:

Mild intermittent motion degradation.

Cerebral volume is normal for age.

Mild scattered T2/FLAIR hyperintensity within the cerebral white
matter is nonspecific, but consistent with chronic small vessel
ischemic disease. These findings have progressed as compared to the
MRI of 05/10/2010.

There is no acute infarct.

No evidence of intracranial mass.

No chronic intracranial blood products.

No extra-axial fluid collection.

No midline shift.

Vascular: Reported below.

Skull and upper cervical spine: No focal marrow lesion.

Sinuses/Orbits: Orbits reported separately. Mild ethmoid sinus
mucosal thickening. No significant mastoid effusion.

MRA HEAD FINDINGS

The intracranial internal carotid arteries are patent. As before,
there is atherosclerotic irregularity of both intracranial internal
carotid arteries with mild stenosis of the cavernous segment on the
right.

The M1 middle cerebral arteries are patent without significant
stenosis. No M2 proximal branch occlusion or high-grade proximal
stenosis is identified.

The anterior cerebral arteries are patent.

No intracranial aneurysm is identified.

The non dominant intracranial right vertebral artery is
developmentally diminutive, but patent, and terminates as the right
PICA. The dominant intracranial left vertebral artery is patent
without significant stenosis, as is the basilar artery. The
posterior cerebral arteries are patent proximally without
significant stenosis. Posterior communicating arteries are
hypoplastic or absent bilaterally.
IMPRESSION: MRI brain:

1. No evidence of acute intracranial abnormality, including acute
infarction.
2. Mild cerebral white matter chronic small vessel ischemic changes,
progressed as compared to the MRI of 05/10/2010.
[DATE]. Mild ethmoid sinus mucosal thickening.

MRA head:

1. No intracranial large vessel occlusion or proximal high-grade
arterial stenosis.
2. Mild atherosclerotic irregularity of the right greater than left
intracranial internal carotid arteries as described.

## 2021-04-11 IMAGING — MR MR ORBITS WO/W CM
15 of 18 series · 32 of 48 positions shown · IV contrast (gadavist)
Comparison: Concurrently performed non-contrast MRI of the brain
and MRA of the head. CT of the orbits 12/05/2015.

CLINICAL DATA: Vision loss, binocular, blurred vision.

EXAM:
MRI OF THE ORBITS WITHOUT AND WITH CONTRAST
TECHNIQUE: Multiplanar, multisequence MR imaging of the orbits was performed
both before and after the administration of intravenous contrast.
CONTRAST:  8mL GADAVIST GADOBUTROL 1 MMOL/ML IV SOLN

[Series 5: DWI · axial · 3.0mm · 1.09mm/px · z∈[-39,+122]mm · 7 of 110 slices shown (1 of 4)]
[im 1/110]
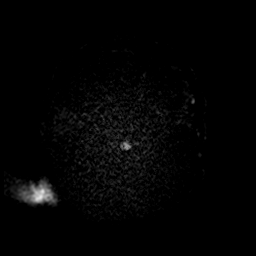
[im 19/110]
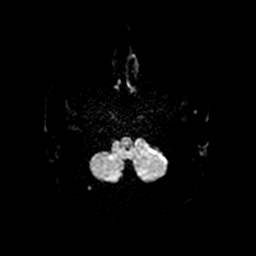
[im 37/110]
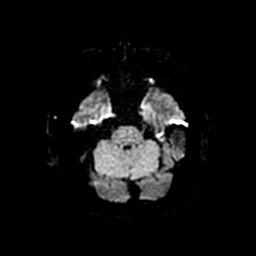
[im 55/110]
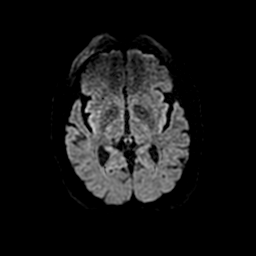
[im 73/110]
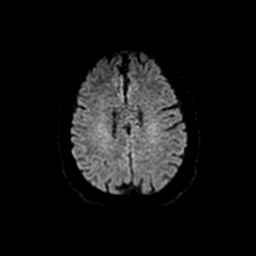
[im 91/110]
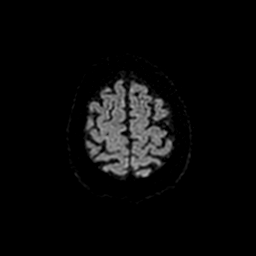
[im 110/110]
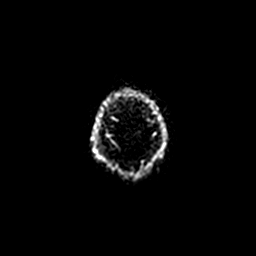

[Series 6: DWI · coronal · 5.0mm · 1.09mm/px · 6 of 82 slices shown (2 of 4)]
[im 1/82]
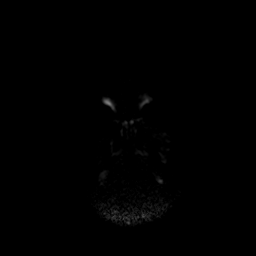
[im 17/82]
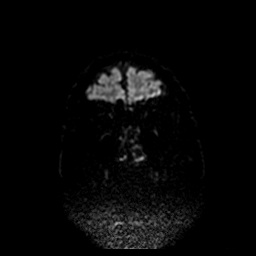
[im 33/82]
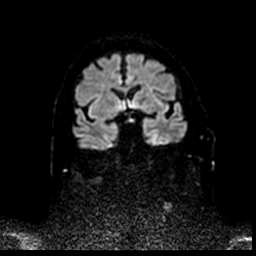
[im 49/82]
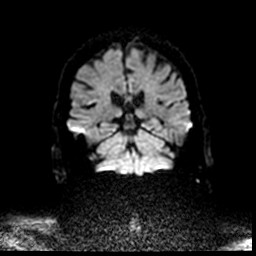
[im 65/82]
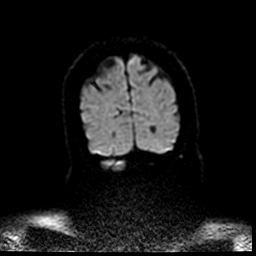
[im 82/82]
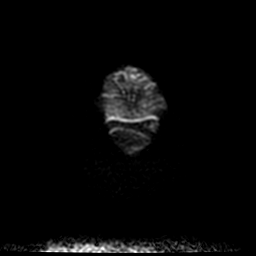

[Series 7: T1 · sagittal · 5.0mm · 0.47mm/px · 1 of 22 slices shown (1 of 4)]
[im 1/22]
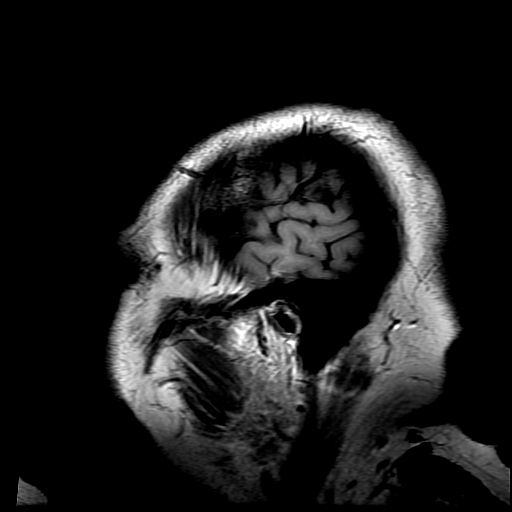

[Series 8: FLAIR · axial · 3.0mm · 0.45mm/px · 1 of 26 slices shown]
[im 1/26]
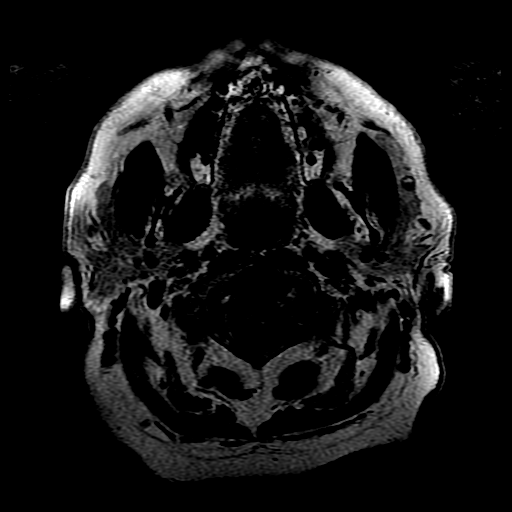

[Series 9: T1 · axial · 3.0mm · 0.35mm/px · 1 of 22 slices shown (2 of 4)]
[im 1/22]
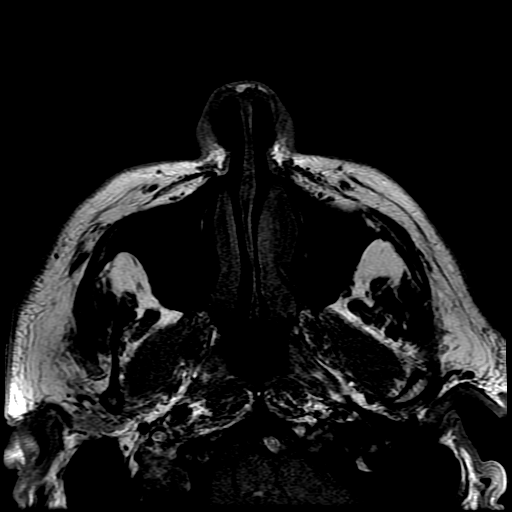

[Series 10: T2 fat-sat · axial · 3.0mm · 0.35mm/px · 1 of 22 slices shown (1 of 2)]
[im 1/22]
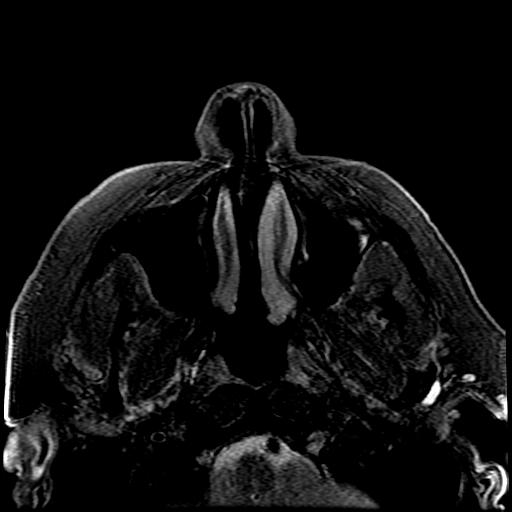

[Series 11: T1 · coronal · 3.0mm · 0.35mm/px · 2 of 41 slices shown (3 of 4)]
[im 1/41]
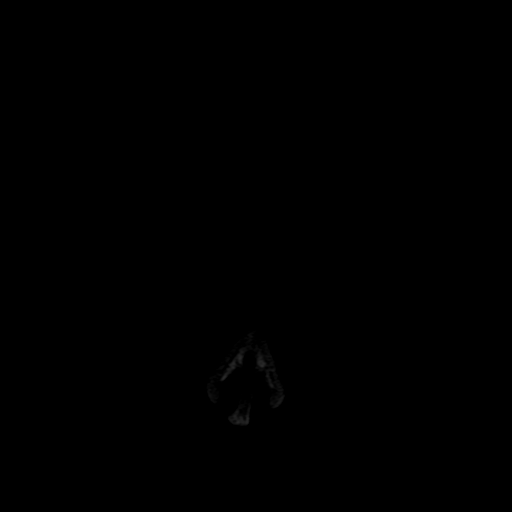
[im 41/41]
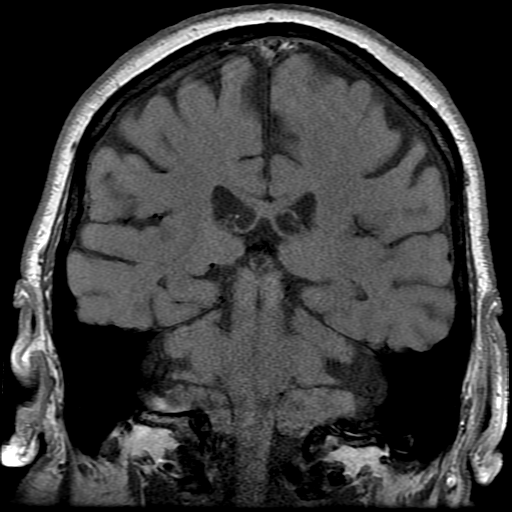

[Series 12: T2 fat-sat · coronal · 3.0mm · 0.35mm/px · 2 of 41 slices shown (2 of 2)]
[im 1/41]
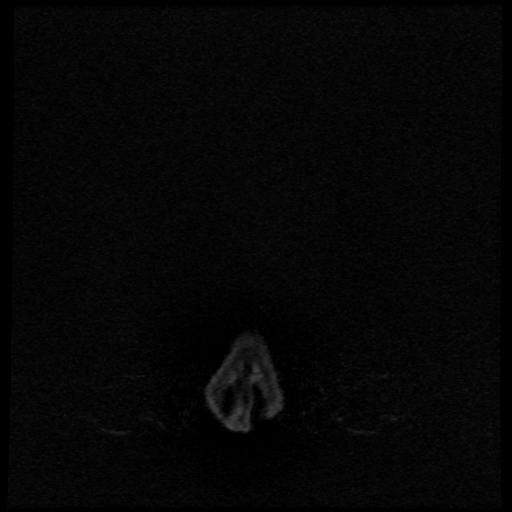
[im 41/41]
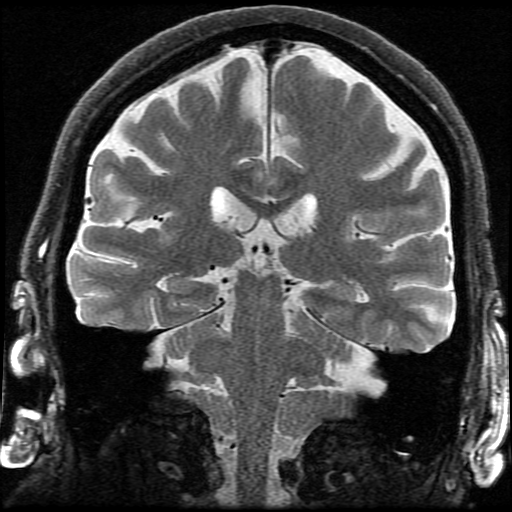

[Series 13: T2 · axial · 5.0mm · 0.45mm/px · 1 of 26 slices shown (1 of 2)]
[im 1/26]
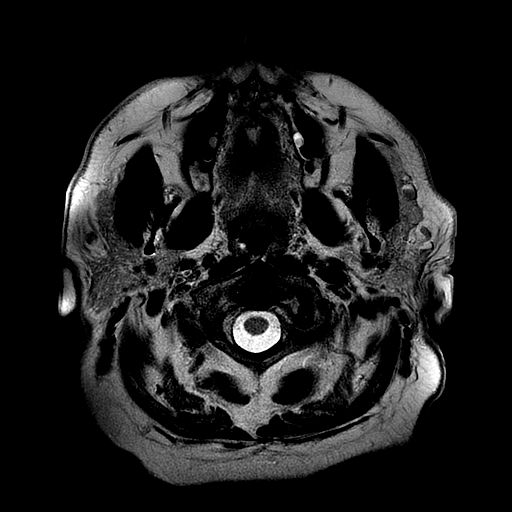

[Series 15: T1 · axial · 3.0mm · 0.45mm/px · 1 of 104 slices shown (4 of 4)]
[im 1/104]
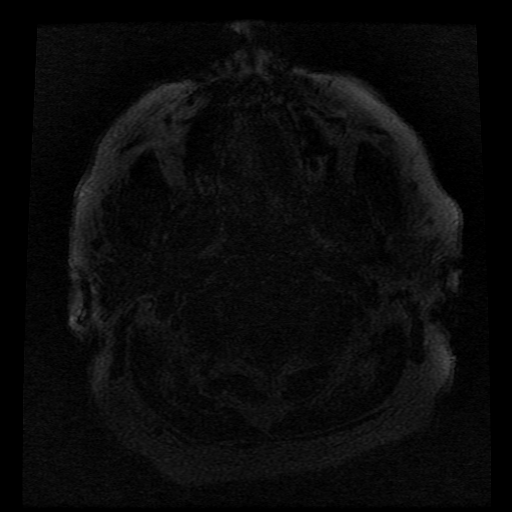

[Series 16: T2 · coronal · 5.0mm · 0.39mm/px · 1 of 25 slices shown (2 of 2)]
[im 1/25]
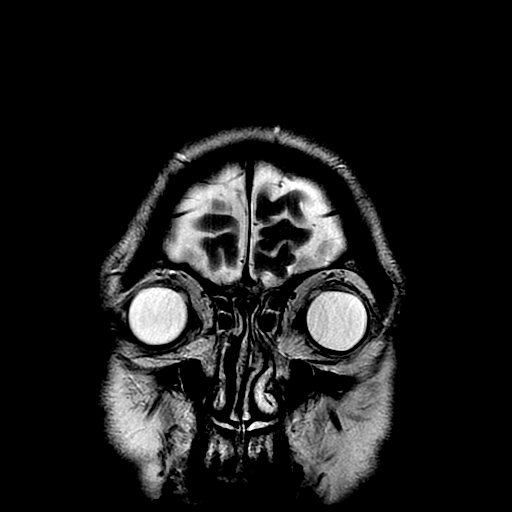

[Series 18: T1 post-contrast · axial · 3.0mm · 0.35mm/px · 1 of 22 slices shown (1 of 2)]
[im 1/22]
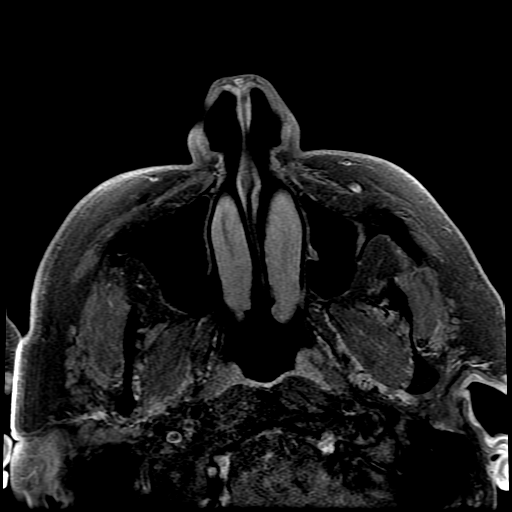

[Series 19: T1 post-contrast · coronal · 3.0mm · 0.35mm/px · 2 of 41 slices shown (2 of 2)]
[im 1/41]
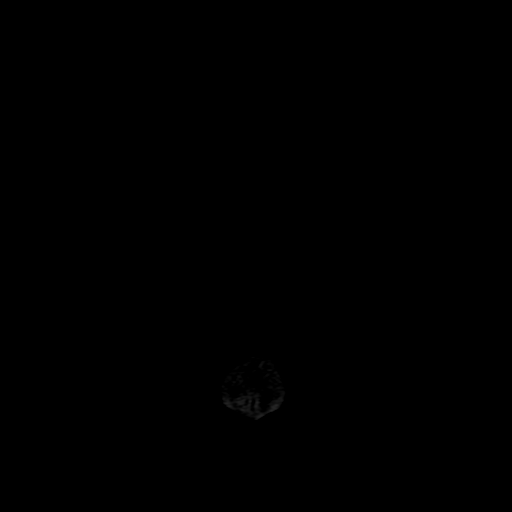
[im 41/41]
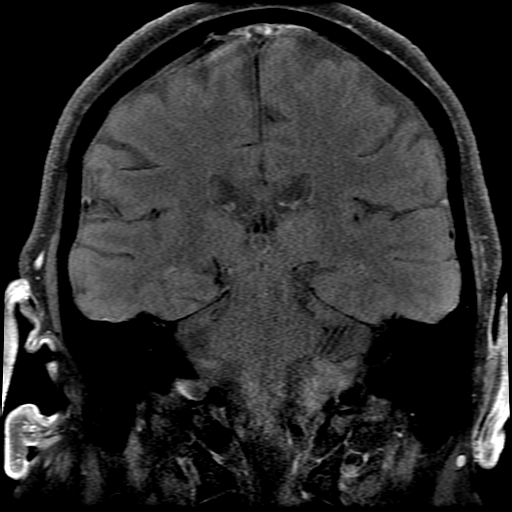

[Series 500: DWI · axial · 3.0mm · 1.09mm/px · z∈[-39,+122]mm · 3 of 55 slices shown (3 of 4)]
[im 1/55]
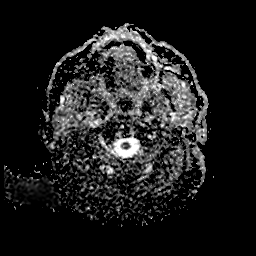
[im 28/55]
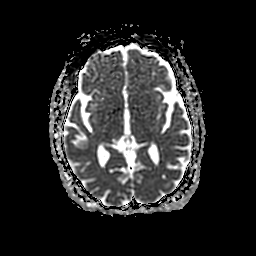
[im 55/55]
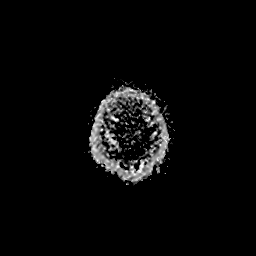

[Series 600: DWI · coronal · 5.0mm · 1.09mm/px · 2 of 41 slices shown (4 of 4)]
[im 1/41]
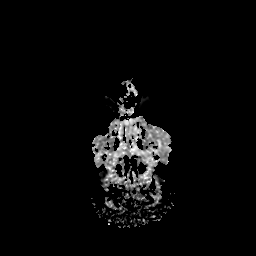
[im 41/41]
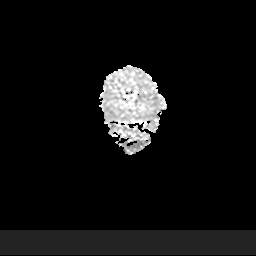

[32 of 48 positions shown; findings below may reference images not displayed]

FINDINGS: Mildly motion degraded examination.

The globes are normal in size and contour. The extraocular muscles
and optic nerve sheath complexes are symmetric and unremarkable. No
intraorbital mass or abnormal intraorbital enhancement is
identified. The suprasellar cistern is patent. There is no mass
effect upon the optic chiasm.

The visualized periorbital and maxillofacial soft tissues are
unremarkable.

Mild ethmoid sinus mucosal thickening.
IMPRESSION: Mildly motion degraded examination.

Unremarkable MRI appearance of the orbits with and without contrast.
No evidence of acute orbital abnormality.

Mild ethmoid sinus mucosal thickening.

## 2021-04-23 DIAGNOSIS — H905 Unspecified sensorineural hearing loss: Secondary | ICD-10-CM | POA: Diagnosis not present

## 2021-04-29 DIAGNOSIS — M47816 Spondylosis without myelopathy or radiculopathy, lumbar region: Secondary | ICD-10-CM | POA: Diagnosis not present

## 2021-05-28 ENCOUNTER — Other Ambulatory Visit: Payer: Self-pay | Admitting: Family Medicine

## 2021-05-28 DIAGNOSIS — F339 Major depressive disorder, recurrent, unspecified: Secondary | ICD-10-CM

## 2021-05-28 MED ORDER — VORTIOXETINE HBR 5 MG PO TABS
5.0000 mg | ORAL_TABLET | Freq: Every day | ORAL | 2 refills | Status: DC
Start: 2021-05-28 — End: 2022-03-01

## 2021-08-05 ENCOUNTER — Emergency Department (HOSPITAL_COMMUNITY)
Admission: EM | Admit: 2021-08-05 | Discharge: 2021-08-06 | Disposition: A | Discharge status: Home / Self Care | Payer: Medicare Other | Attending: Student | Admitting: Student

## 2021-08-05 DIAGNOSIS — M48061 Spinal stenosis, lumbar region without neurogenic claudication: Secondary | ICD-10-CM | POA: Diagnosis not present

## 2021-08-05 DIAGNOSIS — Z8669 Personal history of other diseases of the nervous system and sense organs: Secondary | ICD-10-CM | POA: Insufficient documentation

## 2021-08-05 DIAGNOSIS — Z7982 Long term (current) use of aspirin: Secondary | ICD-10-CM | POA: Diagnosis not present

## 2021-08-05 DIAGNOSIS — I110 Hypertensive heart disease with heart failure: Secondary | ICD-10-CM | POA: Insufficient documentation

## 2021-08-05 DIAGNOSIS — M5136 Other intervertebral disc degeneration, lumbar region: Secondary | ICD-10-CM | POA: Diagnosis not present

## 2021-08-05 DIAGNOSIS — Z79899 Other long term (current) drug therapy: Secondary | ICD-10-CM | POA: Insufficient documentation

## 2021-08-05 DIAGNOSIS — Z85038 Personal history of other malignant neoplasm of large intestine: Secondary | ICD-10-CM | POA: Insufficient documentation

## 2021-08-05 DIAGNOSIS — Z743 Need for continuous supervision: Secondary | ICD-10-CM | POA: Diagnosis not present

## 2021-08-05 DIAGNOSIS — Z951 Presence of aortocoronary bypass graft: Secondary | ICD-10-CM | POA: Diagnosis not present

## 2021-08-05 DIAGNOSIS — I2510 Atherosclerotic heart disease of native coronary artery without angina pectoris: Secondary | ICD-10-CM | POA: Insufficient documentation

## 2021-08-05 DIAGNOSIS — I5022 Chronic systolic (congestive) heart failure: Secondary | ICD-10-CM | POA: Diagnosis not present

## 2021-08-05 DIAGNOSIS — M5431 Sciatica, right side: Secondary | ICD-10-CM | POA: Diagnosis not present

## 2021-08-05 DIAGNOSIS — M5432 Sciatica, left side: Secondary | ICD-10-CM | POA: Diagnosis not present

## 2021-08-05 DIAGNOSIS — M549 Dorsalgia, unspecified: Secondary | ICD-10-CM | POA: Diagnosis not present

## 2021-08-05 DIAGNOSIS — M5030 Other cervical disc degeneration, unspecified cervical region: Secondary | ICD-10-CM | POA: Insufficient documentation

## 2021-08-05 DIAGNOSIS — M4316 Spondylolisthesis, lumbar region: Secondary | ICD-10-CM | POA: Diagnosis not present

## 2021-08-05 DIAGNOSIS — M545 Low back pain, unspecified: Secondary | ICD-10-CM | POA: Diagnosis present

## 2021-08-05 NOTE — ED Triage Notes (Signed)
Pt BIBEMS for right sided "sciatic" pain. For 3 days. Patient has hx of same, gets routine injections per patient.  VSS with EMS

## 2021-08-05 NOTE — ED Provider Triage Note (Signed)
Emergency Medicine Provider Triage Evaluation Note  MATTEO BANKE , a 76 y.o. male  was evaluated in triage.  Pt complains of right lumbar back pain.  Pain has been present for the last 3 days.  Constant over the last 3 days.  Pain radiates throughout right lower leg.  Patient denies any recent falls or injuries.  Patient states previous episodes of sciatica he has had.  Patient endorses history of colon cancer.  Review of Systems  Positive: Back pain Negative: Fever, chills, dysuria, hematuria, urinary urgency  Physical Exam  BP (!) 174/98 (BP Location: Right Arm)    Pulse (!) 105    Temp 98.3 F (36.8 C) (Oral)    Resp 17    SpO2 94%  Gen:   Awake, no distress   Resp:  Normal effort  MSK:   Moves extremities without difficulty  Other:  No midline tenderness or deformity to cervical, thoracic, lumbar spine.  Tenderness to right lumbar back.  Medical Decision Making  Medically screening exam initiated at 11:45 PM.  Appropriate orders placed.  PRATT BRESS was informed that the remainder of the evaluation will be completed by another provider, this initial triage assessment does not replace that evaluation, and the importance of remaining in the ED until their evaluation is complete.  Due to patient's history of cancer no recent imaging will obtain CT scan of lumbar back.   Loni Beckwith, [place] 08/05/21 2346

## 2021-08-06 ENCOUNTER — Emergency Department (HOSPITAL_COMMUNITY): Payer: Medicare Other

## 2021-08-06 DIAGNOSIS — M5431 Sciatica, right side: Secondary | ICD-10-CM | POA: Diagnosis not present

## 2021-08-06 DIAGNOSIS — M4316 Spondylolisthesis, lumbar region: Secondary | ICD-10-CM | POA: Diagnosis not present

## 2021-08-06 DIAGNOSIS — M48061 Spinal stenosis, lumbar region without neurogenic claudication: Secondary | ICD-10-CM | POA: Diagnosis not present

## 2021-08-06 MED ORDER — LIDOCAINE 5 % EX PTCH
1.0000 | MEDICATED_PATCH | CUTANEOUS | Status: DC
Start: 2021-08-06 — End: 2021-08-06
  Administered 2021-08-06: 1 via TRANSDERMAL
  Filled 2021-08-06: qty 1

## 2021-08-06 MED ORDER — ACETAMINOPHEN 500 MG PO TABS
1000.0000 mg | ORAL_TABLET | Freq: Once | ORAL | Status: AC
  Administered 2021-08-06: 1000 mg via ORAL
  Filled 2021-08-06: qty 2

## 2021-08-06 MED ORDER — PREDNISONE 20 MG PO TABS: 40.0000 mg | ORAL_TABLET | Freq: Every day | ORAL | 0 refills | Status: AC

## 2021-08-06 MED ORDER — LIDOCAINE 4 % EX PTCH: 1.0000 | MEDICATED_PATCH | Freq: Two times a day (BID) | CUTANEOUS | 0 refills | Status: DC | PRN

## 2021-08-06 NOTE — Discharge Instructions (Addendum)
You came to the emergency department today to be evaluated for your back and leg pain.  Your physical exam was reassuring.  The CT scan of your lumbar back showed no acute abnormalities.  It did show advanced multilevel degenerative spondylosis and facet arthrosis with moderate to severe bilateral foraminal stenosis.  Due to these findings please follow-up with [place] [facility] and your orthopedic provider.  I have given you prescription for lidocaine patches.  Please use these help with your pain.  Please take Tylenol (acetaminophen) to relieve your pain.  You make take tylenol, up to 1,000 mg (two extra strength pills) every 8 hours as needed.  Do not take more than 3,000 mg tylenol in a 24 hour period (not more than one dose every 8 hours.  Please check all medication labels as many medications such as pain and cold medications may contain tylenol.  Do not drink alcohol while taking these medications.  I have given you a prescription for steroids today.  Some common side effects include feelings of extra energy, feeling warm, increased appetite, and stomach upset.  If you are diabetic your sugars may run higher than usual.    Get help right away if: You are not able to control when you urinate or have bowel movements (incontinence). You have: Weakness in your lower back, pelvis, buttocks, or legs that gets worse. Redness or swelling of your back. A burning sensation when you urinate.

## 2021-08-06 NOTE — ED Provider Notes (Signed)
[facility] EMERGENCY DEPARTMENT Provider Note   CSN: [id number] Arrival date & time: 08/05/21  2306     History  Chief Complaint  Patient presents with   Leg Pain    Shawn Meza is a 76 y.o. male with a history of Parkinson's disease, lumbar spinal stenosis, CAD status post CABG, chronic systolic congestive heart failure, hypertension, colon cancer.  Presents to the emergency department with a chief complaint of right lumbar back pain.  Patient reports pain has been present over the last 3 days.  Pain has been constant and unchanged over this time.  Pain radiates throughout right lower extremity.  Patient reports taking ibuprofen on the evening of 08/05/2021 with improvement in his symptoms.  Denies any recent falls or injuries.  Patient denies any fever, chills, numbness, weakness, saddle anesthesia, bowel or bladder dysfunction, dysuria, hematuria, urinary urgency.   Leg Pain Associated symptoms: back pain   Associated symptoms: no fever and no neck pain       Home Medications Prior to Admission medications   Medication Sig Start Date End Date Taking? Authorizing Provider  aspirin 81 MG EC tablet Take 1 tablet (81 mg total) by mouth daily. 09/01/20   [name], DO  carbidopa-levodopa (SINEMET CR) 50-200 MG tablet Take 1 tablet by mouth at bedtime. 04/08/21   [name], Eustace Quail, DO  Carbidopa-Levodopa ER (SINEMET CR) 25-100 MG tablet controlled release 2 at 7am, 2 at 11am, 1 at 4pm 04/08/21   [name], Eustace Quail, DO  Cholecalciferol (D3-1000) 25 MCG (1000 UT) capsule Take 1,000 Units by mouth daily.    [provider]  esomeprazole (NEXIUM) 40 MG capsule Take 1 capsule (40 mg total) by mouth at bedtime. 08/28/20   Wendling, Crosby Oyster, DO  FLOVENT HFA 110 MCG/ACT inhaler Inhale 1 puff into the lungs daily as needed (wheezing, shortness of breath). 02/09/21   [provider]  fluocinonide-emollient (LIDEX-E) 0.05 % cream Apply 1 application  topically 2 (two) times daily. Patient taking differently: Apply 1 application topically 2 (two) times daily as needed (to affected areas). 10/08/18   Raylene Everts, MD  furosemide (LASIX) 40 MG tablet Take 1 tablet (40 mg total) by mouth 3 (three) times a week. 11/18/20   Richardo Priest, MD  gabapentin (NEURONTIN) 100 MG capsule Take 1 capsule (100 mg total) by mouth at bedtime. 08/28/20   [name], DO  metoprolol tartrate (LOPRESSOR) 25 MG tablet Take 1 tablet (25 mg total) by mouth daily. 11/18/20   Richardo Priest, MD  Multiple Vitamin (MULTI-VITAMIN) tablet Take 1 tablet by mouth daily.    [provider]  nitroGLYCERIN (NITROSTAT) 0.4 MG SL tablet Place 1 tablet (0.4 mg total) under the tongue every 5 (five) minutes x 3 doses as needed for chest pain. 08/28/20   [name], DO  pravastatin (PRAVACHOL) 20 MG tablet Take 1 tablet (20 mg total) by mouth daily. 03/08/21   [name], DO  prednisoLONE acetate (PRED FORTE) 1 % ophthalmic suspension Place 1 drop into both eyes 2 (two) times daily.    [provider]  sacubitril-valsartan (ENTRESTO) 24-26 MG Take 1 tablet by mouth daily. 11/18/20   Richardo Priest, MD  vortioxetine HBr (TRINTELLIX) 5 MG TABS tablet Take 1 tablet (5 mg total) by mouth daily. 05/28/21   [name], DO      Allergies    Testosterone, Fluoxetine, and Requip [ropinirole]    Review of Systems  Review of Systems  Constitutional:  Negative for chills and fever.  Eyes:  Negative for visual disturbance.  Respiratory:  Negative for shortness of breath.   Cardiovascular:  Negative for chest pain.  Gastrointestinal:  Negative for abdominal pain, nausea and vomiting.  Genitourinary:  Negative for difficulty urinating, dysuria, hematuria and urgency.  Musculoskeletal:  Positive for back pain and myalgias. Negative for neck pain.  Skin:  Negative for color change and rash.  Neurological:  Negative for  dizziness, syncope, weakness, light-headedness, numbness and headaches.  Psychiatric/Behavioral:  Negative for confusion.    Physical Exam Updated Vital Signs BP (!) 153/86 (BP Location: Right Arm)    Pulse 85    Temp 98.4 F (36.9 C) (Oral)    Resp 17    SpO2 95%  Physical Exam Vitals and nursing note reviewed.  Constitutional:      General: He is not in acute distress.    Appearance: He is not ill-appearing, toxic-appearing or diaphoretic.  HENT:     Head: Normocephalic.  Eyes:     General: No scleral icterus.       Right eye: No discharge.        Left eye: No discharge.  Cardiovascular:     Rate and Rhythm: Normal rate.  Pulmonary:     Effort: Pulmonary effort is normal.  Musculoskeletal:     Cervical back: Normal.     Thoracic back: No swelling, edema, deformity, signs of trauma, lacerations, spasms, tenderness or bony tenderness.     Lumbar back: No swelling, edema, deformity, signs of trauma, lacerations, spasms, tenderness or bony tenderness. Positive right straight leg raise test. Negative left straight leg raise test.     Comments: No midline tenderness or deformity to cervical, thoracic, or lumbar spine.  Tenderness to right lumbar back.  No tenderness, bony tenderness, or deformity to bilateral lower extremities.  Patient able to move all limbs equally without difficulty.  Patient able to stand and ambulate without difficulty.  Skin:    General: Skin is warm and dry.  Neurological:     General: No focal deficit present.     Mental Status: He is alert.  Psychiatric:        Behavior: Behavior is cooperative.    ED Results / Procedures / Treatments   Labs (all labs ordered are listed, but only abnormal results are displayed) Labs Reviewed - No data to display  EKG None  Radiology CT Lumbar Spine Wo Contrast  Result Date: 08/06/2021 CLINICAL DATA:  Initial evaluation for acute low back pain. Cancer suspected. EXAM: CT LUMBAR SPINE WITHOUT CONTRAST TECHNIQUE:  Multidetector CT imaging of the lumbar spine was performed without intravenous contrast administration. Multiplanar CT image reconstructions were also generated. COMPARISON:  MRI from 05/21/2019. FINDINGS: Segmentation: Standard. Lowest well-formed disc space labeled the L5-S1 level. Alignment: Mild sigmoid scoliotic curvature. 3 mm anterolisthesis of L5 on S1. Trace retrolisthesis of L2 on L3 and L3 on L4. Vertebrae: Vertebral body height maintained without acute or chronic fracture. Visualized sacrum and pelvis intact. SI joints symmetric and within normal limits. No visible discrete lytic or blastic osseous lesions by CT. Paraspinal and other soft tissues: Paraspinous soft tissues demonstrate no acute finding. Moderate aorto bi-iliac atherosclerotic disease. Punctate nonobstructive left renal nephrolithiasis noted. Few small cystic lesions noted about the visualized right kidney, not well characterized on this noncontrast examination, but likely benign. Disc levels: T12-L1: Disc desiccation with prominent endplate spurring. Mild facet hypertrophy. No canal or foraminal stenosis. L1-2: Advanced  degenerative intervertebral disc space narrowing with diffuse disc bulge and disc desiccation. Bulky endplate osteophytic spurring on the left. Moderate left greater than right facet hypertrophy. No significant spinal stenosis. Foramina remain patent. L2-3: Moderate degenerative intervertebral disc space narrowing with diffuse disc bulge. Left-sided bulky endplate osteophytic spurring. Moderate bilateral facet hypertrophy. No significant spinal stenosis. Foramina remain patent. L3-4: Advanced degenerative intervertebral disc space narrowing with diffuse disc bulge and disc desiccation. Associated reactive endplate spurring. Moderate to advanced facet hypertrophy. Resultant fairly severe canal with right greater than left lateral recess stenosis. Moderate to severe right worse than left L3 foraminal narrowing. L4-5:  Degenerative intervertebral disc space narrowing with diffuse disc bulge and disc desiccation. Associated reactive endplate spurring, greater on the right. Severe bilateral facet arthrosis. Probable superimposed small central disc protrusion indents the ventral thecal sac (series 6, image 82). Resultant moderate canal with moderate to severe bilateral subarticular stenosis. Mild to moderate bilateral foraminal narrowing. L5-S1: Anterolisthesis. Degenerative intervertebral disc space narrowing with diffuse disc bulge and disc desiccation. Severe bilateral facet arthrosis. Resultant moderate narrowing of the lateral recesses bilaterally. Central canal remains patent. Moderate to severe bilateral L5 foraminal stenosis. IMPRESSION: 1. No acute abnormality within the lumbar spine. No visible evidence for malignancy. If there remains clinical concern for a possible underlying neoplastic process, MRI could be performed for further evaluation as warranted. 2. 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 Resultant severe canal with right greater than left lateral recess stenosis at L3-4, with moderate to severe bilateral subarticular stenosis at L4-5. 3. Moderate to severe bilateral L3 and L5 foraminal stenosis related to disc bulge, reactive endplate spurring, and facet degeneration. 4. Punctate nonobstructive left renal nephrolithiasis. 5. Aortic Atherosclerosis (ICD10-I70.0). Electronically Signed   By: Jeannine Boga M.D.   On: 08/06/2021 01:00    Procedures Procedures    Medications Ordered in ED Medications  lidocaine (LIDODERM) 5 % 1 patch (1 patch Transdermal Patch Applied 08/06/21 0443)  acetaminophen (TYLENOL) tablet 1,000 mg (1,000 mg Oral Given 08/06/21 0444)    ED Course/ Medical Decision Making/ A&P                           Medical Decision Making  This patient presents to the ED for concern of lumbar back pain, this involves an extensive number of treatment  options, and is a complaint that carries with it a high risk of complications and morbidity.  The differential diagnosis includes  but is not limited to fracture, muscle strain, cauda equina, spinal stenosis. DDD, ankylosing spondylitis, acute ligamentous injury, disk herniation, spondylolisthesis, kidney stone, pyelonephritis, malignancy.   Co morbidities that complicate the patient evaluation  Parkinson's, CAD, hypertension,   Additional history obtained:  Additional history obtained from patient External records from outside source obtained and reviewed including notes from cardiology and neurology   Imaging Studies ordered:  I ordered imaging studies including noncontrast CT lumbar spine Which showed: No acute abnormality within the lumbar spine Advanced multilevel degenerative spondylosis and facet arthrosis.  With canal stenosis Moderate to severe bilateral foraminal stenosis related to disc bulge, reactive endplate spurring, and facet degeneration Punctate nonobstructive left renal nephrolithiasis   Medicines ordered and prescription drug management:  I ordered medication including lidocaine patch and Tylenol for pain management I have reviewed the patients home medicines and have made adjustments as needed   Test Considered:  Urinalysis   Problem List / ED Course:  Lumbar back pain Lumbar back  pain x3 days with radiation throughout right lower extremity. On physical exam patient has tenderness to right lumbar back and positive right straight leg raise test Patient has history of spinal stenosis per chart review. Due to patient's history of colon cancer CT imaging of lumbar spine was obtained to evaluate for possible malignancy CT imaging shows no acute osseous abnormality. Suspect that patient's pain is due to spinal stenosis versus sciatica versus muscle strain.  Will prescribe patient with short course of prednisone and lidocaine patch.  Patient advised to use  Tylenol for pain management.  Patient advised to follow-up with his orthopedic fire as well as [place] [facility] in outpatient setting.   Reevaluation:  After the interventions noted above, I reevaluated the patient and found that they have :stayed the same   Disposition:  After consideration of the diagnostic results and the patients response to treatment, I feel that the patent would benefit from discharge and follow-up with orthopedic provider and neurosurgery in outpatient setting.  Discussed results, findings, treatment and follow up. Patient advised of return precautions. Patient verbalized understanding and agreed with plan.           Final Clinical Impression(s) / ED Diagnoses Final diagnoses:  Sciatica of right side  Degenerative disc disease, lumbar    Rx / DC Orders ED Discharge Orders          Ordered    predniSONE (DELTASONE) 20 MG tablet  Daily        08/06/21 0411    Lidocaine (HM LIDOCAINE PATCH) 4 % PTCH  Every 12 hours PRN        08/06/21 0411              Loni Beckwith, PA-C 08/06/21 0855    Teressa Lower, MD 08/08/21 1556

## 2021-08-09 ENCOUNTER — Other Ambulatory Visit: Payer: Self-pay | Admitting: Family Medicine

## 2021-08-09 MED ORDER — FLOVENT HFA 110 MCG/ACT IN AERO: 1.0000 | INHALATION_SPRAY | Freq: Every day | RESPIRATORY_TRACT | 3 refills | Status: DC | PRN

## 2021-08-13 ENCOUNTER — Encounter: Payer: Self-pay | Admitting: Family Medicine

## 2021-08-13 ENCOUNTER — Telehealth (INDEPENDENT_AMBULATORY_CARE_PROVIDER_SITE_OTHER): Payer: Commercial Managed Care - HMO | Admitting: Family Medicine

## 2021-08-13 DIAGNOSIS — M5416 Radiculopathy, lumbar region: Secondary | ICD-10-CM

## 2021-08-13 MED ORDER — TIZANIDINE HCL 4 MG PO TABS: 4.0000 mg | ORAL_TABLET | Freq: Four times a day (QID) | ORAL | 0 refills | Status: DC | PRN

## 2021-08-13 MED ORDER — DICLOFENAC SODIUM 1 % EX GEL: 2.0000 g | Freq: Four times a day (QID) | CUTANEOUS | 0 refills | Status: DC

## 2021-08-13 NOTE — Progress Notes (Signed)
Musculoskeletal Exam  Patient: Shawn Meza DOB: 1945-10-31  DOS: 08/13/2021  SUBJECTIVE:  Chief Complaint:   CC: Low back pain   Shawn Meza is a 76 y.o.  male for evaluation and treatment of back pain. Due to COVID-19 pandemic, we are interacting via telephone. I verified patient's ID using 2 identifiers. Patient agreed to proceed with visit via this method. Patient is at home, I am at office. Patient and I are present for visit.   Onset:  2 weeks ago.  No inj or change in activity.  Location: R lower Character:   cramping, spasms   Progression of issue:  has worsened Associated symptoms: cannot walk, radiating down legs Denies bowel/bladder incontinence or weakness Treatment: to date has been acetaminophen, oral steroids, and topical lidocaine.  Went to ED and was given Tylenol and a pain patch, sent home. Imaging shows degeneration and disc bulging. He has had injections in his back before. He has received shots in the back before that has helped before.  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   Diarrhea 11/04/2020   Elevated PSA    Erectile dysfunction    Essential hypertension    Essential tremor    GERD (gastroesophageal reflux disease)    Headache(784.0)    Hiatal hernia    Hypercholesterolemia    Long-term use of aspirin therapy    Low back pain 10/28/2019   Major depression, chronic    Medial meniscus tear 2/87/6811   Metabolic syndrome    Morbid obesity ([place])    Myofascial pain 12/20/2019   Nephrolithiasis    hx of   NSTEMI (non-ST elevated myocardial infarction) ([place])    Parkinson's disease ([place])    S/P CABG (coronary artery bypass graft)    Transient ischemic attack    hx of   Trochanteric bursitis of right hip     Objective: No conversational dyspnea Age appropriate  judgment and insight Nml affect and mood  Assessment:  Lumbar radiculopathy - Plan: tiZANidine (ZANAFLEX) 4 MG tablet, diclofenac Sodium (VOLTAREN) 1 % GEL  Plan: Stretches/exercises, heat, ice, Tylenol F/u in 1 mo for CPE Total time: 12 min The patient voiced understanding and agreement to the plan.   [place], DO 08/13/21  12:17 PM

## 2021-08-13 NOTE — Patient Instructions (Signed)

## 2021-08-17 ENCOUNTER — Telehealth: Payer: Self-pay | Admitting: Neurology

## 2021-08-17 NOTE — Telephone Encounter (Signed)
Pt was given the number to Dr Vertell Limber office

## 2021-08-17 NOTE — Telephone Encounter (Signed)
Patient needs a call back to figure out who he needs to see about his leg pain. He doesn't remember, he said tat would know.

## 2021-08-23 ENCOUNTER — Telehealth (INDEPENDENT_AMBULATORY_CARE_PROVIDER_SITE_OTHER): Payer: Commercial Managed Care - HMO | Admitting: Licensed Clinical Social Worker

## 2021-08-23 ENCOUNTER — Encounter: Payer: Self-pay | Admitting: Licensed Clinical Social Worker

## 2021-08-23 DIAGNOSIS — F330 Major depressive disorder, recurrent, mild: Secondary | ICD-10-CM | POA: Diagnosis not present

## 2021-08-23 NOTE — Progress Notes (Signed)
Integrated Behavioral Health via Telemedicine Visit  08/23/2021 HODGES TREIBER [id number]  Number of [place] visits: 4 Session Start time: 2:00  Session End time: 2:50 Total time: 50   Referring Provider: Dr. Wells Guiles [name] Patient/Family location: Home [facility] Provider location: Office All persons participating in visit: Pt. Shawn Meza  Types of Service: Video visit  I connected with [name] and/or [place] patient via  Telephone or Video Enabled Telemedicine Application  (Video is Caregility application) and verified that I am speaking with the correct person using two identifiers. Discussed confidentiality: Yes   I discussed the limitations of telemedicine and the availability of in person appointments.  Discussed there is a possibility of technology failure and discussed alternative modes of communication if that failure occurs.  I discussed that engaging in this telemedicine visit, they consent to the provision of behavioral healthcare and the services will be billed under their insurance.  Patient and/or legal guardian expressed understanding and consented to Telemedicine visit: Yes   Presenting Concerns: Patient and/or family reports the following symptoms/concerns: Pt reports increase in depressive symptoms related to health changes   Duration of problem: Few works; Severity of problem: mild  Patient and/or Family's Strengths/Protective Factors: Concrete supports in place (healthy food, safe environments, etc.)  Goals Addressed: Patient will:  Reduce symptoms of: depression   Increase knowledge and/or ability of: coping skills   Demonstrate ability to: Increase healthy adjustment to current life circumstances  Progress towards Goals: Ongoing  Interventions: Interventions utilized:  Motivational Interviewing and Solution-Focused Strategies Standardized Assessments completed: Not Needed  Patient and/or Family Response: Pt open to  interventions related to depression and open to community resource for care management .   Assessment: Patient currently experiencing Pt reports increase in depressive symptoms due to change in medical issues and feeling isolated .   Patient may benefit from Increase psychotherapy to help with mood and care management with [place] for assistance with managing health issues .  Plan: Follow up with behavioral health clinician on : In three to four weeks  Behavioral recommendations: Follow up with recommendations of replacing depression promoting thoughts with mood elevating thoughts and keep track of daily mood.  In addition, decrease isolation if possible and follow up with community resource.    Referral(s): Community Resources:  [place] for care case management program   I discussed the assessment and treatment plan with the patient and/or parent/guardian. They were provided an opportunity to ask questions and all were answered. They agreed with the plan and demonstrated an understanding of the instructions.   They were advised to call back or seek an in-person evaluation if the symptoms worsen or if the condition fails to improve as anticipated.  Brysan Mcevoy A Taylor-Paladino, LCSW

## 2021-08-24 ENCOUNTER — Other Ambulatory Visit: Payer: Self-pay

## 2021-08-24 DIAGNOSIS — G20 Parkinson's disease: Secondary | ICD-10-CM

## 2021-08-24 DIAGNOSIS — G20A1 Parkinson's disease without dyskinesia, without mention of fluctuations: Secondary | ICD-10-CM

## 2021-08-24 NOTE — Patient Outreach (Signed)
[facility]) Care Management  08/24/2021  Shawn Meza 10-30-1945 [id number]   Received a faxed referral from Althea Charon, LCSW for Care management services for patient. Patient PCP is in am embedded practice sent [id number] and saved faxed document to media in Epic.  Thank you, [place] Care Management Assistant

## 2021-08-25 ENCOUNTER — Telehealth: Payer: Self-pay | Admitting: *Deleted

## 2021-08-25 ENCOUNTER — Other Ambulatory Visit: Payer: Self-pay | Admitting: Family Medicine

## 2021-08-25 DIAGNOSIS — G20 Parkinson's disease: Secondary | ICD-10-CM

## 2021-08-25 NOTE — Chronic Care Management (AMB) (Signed)
Chronic Care Management   Note  08/25/2021 Name: [name] MRN: [id number] DOB: Jan 02, 1946  ANIEL HUBBLE is a 76 y.o. year old male who is a primary care patient of [name], DO. I reached out to [name] by phone today in response to a referral sent by Mr. WASIL WOLKE PCP.  Mr. Weidinger was given information about Chronic Care Management services today including:  CCM service includes personalized support from designated clinical staff supervised by his physician, including individualized plan of care and coordination with other care providers 24/7 contact phone numbers for assistance for urgent and routine care needs. Service will only be billed when office clinical staff spend 20 minutes or more in a month to coordinate care. Only one practitioner may furnish and bill the service in a calendar month. The patient may stop CCM services at any time (effective at the end of the month) by phone call to the office staff. The patient is responsible for co-pay (up to 20% after annual deductible is met) if co-pay is required by the individual health plan.   Patient agreed to services and verbal consent obtained.   Follow up plan: Telephone appointment with care management team member scheduled for: 09/14/2021  Julian Hy, [place] Management  Direct Dial: [phone]

## 2021-08-25 NOTE — Chronic Care Management (AMB) (Signed)
°  Chronic Care Management   Outreach Note  08/25/2021 Name: KHAIR CHASTEEN MRN: [id number] DOB: Dec 19, 1945  TOMMASO CAVITT is a 76 y.o. year old male who is a primary care patient of [name], DO. I reached out to [name] by phone today in response to a referral sent by Mr. ROMIE TAY primary care provider.  An unsuccessful telephone outreach was attempted today. The patient was referred to the case management team for assistance with care management and care coordination.   Follow Up Plan: A HIPAA compliant phone message was left for the patient providing contact information and requesting a return call.  If patient returns call to provider office, please advise to call Embedded Care Management Care Guide Nyliah Nierenberg at [place], [place] [facility]  Direct Dial: [phone]

## 2021-08-30 ENCOUNTER — Other Ambulatory Visit: Payer: Self-pay | Admitting: Family Medicine

## 2021-08-30 MED ORDER — ASPIRIN 81 MG PO TBEC: 81.0000 mg | DELAYED_RELEASE_TABLET | Freq: Every day | ORAL | 3 refills | Status: AC

## 2021-08-30 MED ORDER — PRAVASTATIN SODIUM 20 MG PO TABS: 20.0000 mg | ORAL_TABLET | Freq: Every day | ORAL | 1 refills | Status: DC

## 2021-09-03 ENCOUNTER — Other Ambulatory Visit: Payer: Self-pay | Admitting: Family Medicine

## 2021-09-03 MED ORDER — ESOMEPRAZOLE MAGNESIUM 40 MG PO CPDR: 40.0000 mg | DELAYED_RELEASE_CAPSULE | Freq: Every day | ORAL | 3 refills | Status: DC

## 2021-09-08 ENCOUNTER — Telehealth: Payer: Self-pay | Admitting: Family Medicine

## 2021-09-08 ENCOUNTER — Other Ambulatory Visit: Payer: Self-pay | Admitting: Family Medicine

## 2021-09-08 ENCOUNTER — Ambulatory Visit: Payer: Medicare Other | Admitting: Family Medicine

## 2021-09-08 DIAGNOSIS — M5416 Radiculopathy, lumbar region: Secondary | ICD-10-CM

## 2021-09-08 NOTE — Telephone Encounter (Signed)
Pt called back. He was informed about his referral.

## 2021-09-08 NOTE — Telephone Encounter (Signed)
Patient would like a referral done for leg pain from prevs visit with Dr. [name] on 1/13. Patient stated mussel relaxer's and gel did not help with the pain.

## 2021-09-08 NOTE — Telephone Encounter (Signed)
Plz refer to sports med. Ty.

## 2021-09-08 NOTE — Telephone Encounter (Signed)
Referral done. Called the patient left msg. To call back.

## 2021-09-09 ENCOUNTER — Telehealth: Payer: Self-pay | Admitting: Family Medicine

## 2021-09-09 NOTE — Telephone Encounter (Signed)
Patient called and stated that he was advised that a referral would be sent to physical therapy

## 2021-09-09 NOTE — Telephone Encounter (Signed)
Pt referral was placed yesterday , referral has not been processed

## 2021-09-14 ENCOUNTER — Telehealth: Payer: Self-pay

## 2021-09-14 ENCOUNTER — Telehealth: Payer: Commercial Managed Care - HMO

## 2021-09-14 NOTE — Telephone Encounter (Signed)
[facility] - Client Client Site [facility] - Night Provider AA - PHYSICIAN, Verita Schneiders- MD Contact Type Call Who Is Calling Patient / Member / Family / Caregiver Caller Name Clive Parcel Caller Phone Number [phone] Patient Name Shawn Meza Patient DOB [birth year] Call Type Message Only Information Provided Reason for Call Request for General Office Information Initial Comment Caller states he is needing to verify appt. Additional Comment hrs prov Disp. Time Disposition Final User 09/14/2021 7:54:39 AM General Information Provided Yes Idolina Primer Call Closed By: Idolina Primer Transaction Date/Time: 09/14/2021 7:52:50 AM (ET).

## 2021-09-16 ENCOUNTER — Encounter: Payer: Self-pay | Admitting: Family Medicine

## 2021-09-16 ENCOUNTER — Ambulatory Visit: Payer: Self-pay

## 2021-09-16 ENCOUNTER — Ambulatory Visit (INDEPENDENT_AMBULATORY_CARE_PROVIDER_SITE_OTHER): Payer: Medicare Other | Admitting: Family Medicine

## 2021-09-16 ENCOUNTER — Telehealth: Payer: Self-pay | Admitting: Family Medicine

## 2021-09-16 VITALS — BP 120/72 | Ht 65.0 in | Wt 210.0 lb

## 2021-09-16 DIAGNOSIS — M47818 Spondylosis without myelopathy or radiculopathy, sacral and sacrococcygeal region: Secondary | ICD-10-CM

## 2021-09-16 DIAGNOSIS — M5416 Radiculopathy, lumbar region: Secondary | ICD-10-CM

## 2021-09-16 DIAGNOSIS — M461 Sacroiliitis, not elsewhere classified: Secondary | ICD-10-CM

## 2021-09-16 HISTORY — DX: Sacroiliitis, not elsewhere classified: M46.1

## 2021-09-16 MED ORDER — TRIAMCINOLONE ACETONIDE 40 MG/ML IJ SUSP
40.0000 mg | Freq: Once | INTRAMUSCULAR | Status: AC
  Administered 2021-09-16: 40 mg via INTRA_ARTICULAR

## 2021-09-16 NOTE — Progress Notes (Signed)
Shawn Meza - 76 y.o. male MRN [id number]  Date of birth: 1945-10-15  SUBJECTIVE:  Including CC & ROS.  No chief complaint on file.   Shawn Meza is a 76 y.o. male that is presenting with acute on chronic right-sided radicular pain.  He has had similar pain in the past.  Has received epidurals.  Has been having acute pain over the past 8 weeks.  No improvement with modalities to date..  Review of the emergency department note from 1/5 shows he was provided prednisone and lidocaine patch. Review of the note from 1/13 shows she was counseled on over-the-counter medications. Independent review of the CT lumbar spine from 1/6 shows severe degenerative disc changes with bridging osteophytes and facet arthrosis.  Review of Systems See HPI   HISTORY: Past Medical, Surgical, Social, and Family History Reviewed & Updated per EMR.   Pertinent Historical Findings include:  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   Diarrhea 11/04/2020   Elevated PSA    Erectile dysfunction    Essential hypertension    Essential tremor    GERD (gastroesophageal reflux disease)    Headache(784.0)    Hiatal hernia    Hypercholesterolemia    Long-term use of aspirin therapy    Low back pain 10/28/2019   Major depression, chronic    Medial meniscus tear 10/06/6576   Metabolic syndrome    Morbid obesity ([place])    Myofascial pain 12/20/2019   Nephrolithiasis    hx of   NSTEMI (non-ST elevated myocardial infarction) ([place])    Parkinson's disease ([place])    S/P CABG (coronary artery bypass graft)    Transient ischemic attack    hx of   Trochanteric bursitis of right hip     Past Surgical History:  Procedure Laterality Date   CARDIAC CATHETERIZATION  5/12,1/13   4 stents placed   COLON SURGERY     CORONARY ARTERY BYPASS GRAFT     KNEE ARTHROSCOPY   10/11/2011   Procedure: ARTHROSCOPY KNEE;  Surgeon: Lorn Junes, MD;  Location: [facility];  Service: Orthopedics;  Laterality: Left;  Left Knee Arthroscopy with Medial and Lateral Partial Menisectomy, Chondroplasty   LEFT HEART CATHETERIZATION WITH CORONARY ANGIOGRAM N/A 08/25/2011   Procedure: LEFT HEART CATHETERIZATION WITH CORONARY ANGIOGRAM;  Surgeon: Burnell Blanks, MD;  Location: [facility] CATH LAB;  Service: Cardiovascular;  Laterality: N/A;   LITHOTRIPSY     STERIOD INJECTION  10/11/2011   Procedure: STEROID INJECTION;  Surgeon: Lorn Junes, MD;  Location: [facility];  Service: Orthopedics;  Laterality: Right;  Steroid Injection Second Toe   TRANSURETHRAL RESECTION OF PROSTATE     URETHRAL DILATION       PHYSICAL EXAM:  VS: BP 120/72 (BP Location: Left Arm, Patient Position: Sitting)    Ht 5\' 5"  (1.651 m)    Wt 210 lb (95.3 kg)    BMI 34.95 kg/m  Physical Exam Gen: NAD, alert, cooperative with exam, well-appearing MSK:  Neurovascularly intact     Aspiration/Injection Procedure Note JAMIEON LANNEN [birth year]  Procedure: Injection Indications: Right SI joint pain  Procedure Details Consent: Risks of procedure as well as the alternatives and risks of each were explained to the (patient/caregiver).  Consent for procedure obtained. Time Out: Verified patient identification, verified procedure, site/side  was marked, verified correct patient position, special equipment/implants available, medications/allergies/relevent history reviewed, required imaging and test results available.  Performed.  The area was cleaned with iodine and alcohol swabs.    The right SI joint was injected using 3 cc 1% lidocaine on a 22-gauge 3-1/2 inch needle.  The syringe was switched and a mixture containing 1 cc's of 40 mg Kenalog and 4 cc's of 0.25% bupivacaine was injected.  Ultrasound was needed for guidance into the endpoint of the joint.Marland Kitchen  Ultrasound was used.  Images were obtained in long views showing the injection.     A sterile dressing was applied.  Patient did tolerate procedure well.     ASSESSMENT & PLAN:   Radiculopathy, lumbar region Acutely occurring.  Has significant degenerative changes and stenosis in the lumbar region.  Has received 2 epidurals this year with the most recent being about 4 months ago. -Counseled on home exercise therapy and supportive care. -Could consider repeat epidural.  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  Has degenerative changes which could be contributing to his current radicular symptoms. -Counseled on home exercise therapy and supportive care. -Injection today. -Could consider physical therapy.

## 2021-09-16 NOTE — Patient Instructions (Signed)
Nice to meet you  Please try heat  Please try the exercises  Please send me a message in MyChart with any questions or updates.  Please call me back in 5 days.   --Dr. Raeford Razor

## 2021-09-16 NOTE — Telephone Encounter (Signed)
Pt brought in DMV form to be filled out Placed into [employer]endling bin up front Patient would like form mailed to him at his address but would like a call to let know its being sent & its completed

## 2021-09-16 NOTE — Assessment & Plan Note (Signed)
Acute on chronic in nature.  Has degenerative changes which could be contributing to his current radicular symptoms. -Counseled on home exercise therapy and supportive care. -Injection today. -Could consider physical therapy.

## 2021-09-16 NOTE — Assessment & Plan Note (Signed)
Acutely occurring.  Has significant degenerative changes and stenosis in the lumbar region.  Has received 2 epidurals this year with the most recent being about 4 months ago. -Counseled on home exercise therapy and supportive care. -Could consider repeat epidural.

## 2021-09-17 ENCOUNTER — Telehealth: Payer: Self-pay

## 2021-09-17 ENCOUNTER — Telehealth: Payer: Medicare Other

## 2021-09-17 NOTE — Telephone Encounter (Signed)
°  Care Management   Follow Up Note   09/17/2021 Name: ADLEY MAZUROWSKI MRN: [id number] DOB: 03-04-1946   Referred by: [name], DO Reason for referral : No chief complaint on file.   Successful outreach was made to patient. However, was not a good time to talk and request to reschedule.  Follow Up Plan:  appointment rescheduled for next week.   Thea Silversmith, RN, MSN, BSN, CCM Care Management Coordinator [facility] [phone]

## 2021-09-17 NOTE — Telephone Encounter (Signed)
Completed Patient informed Mailed to him

## 2021-09-20 ENCOUNTER — Telehealth: Payer: Self-pay | Admitting: Family Medicine

## 2021-09-20 NOTE — Telephone Encounter (Signed)
Patient called to say he is 75% better than he was at Friday's appt(he was advised to do so by Dr. Raeford Razor).   Pt also says will consider PT .  -Forwarding message to med asst to share w/ provider.  --glh

## 2021-09-21 NOTE — Telephone Encounter (Signed)
Opened in error

## 2021-09-23 ENCOUNTER — Ambulatory Visit (INDEPENDENT_AMBULATORY_CARE_PROVIDER_SITE_OTHER): Payer: Medicare Other

## 2021-09-23 DIAGNOSIS — G20 Parkinson's disease: Secondary | ICD-10-CM

## 2021-09-23 DIAGNOSIS — G20A1 Parkinson's disease without dyskinesia, without mention of fluctuations: Secondary | ICD-10-CM

## 2021-09-23 DIAGNOSIS — F339 Major depressive disorder, recurrent, unspecified: Secondary | ICD-10-CM

## 2021-09-23 DIAGNOSIS — M5416 Radiculopathy, lumbar region: Secondary | ICD-10-CM

## 2021-09-23 NOTE — Chronic Care Management (AMB) (Signed)
Chronic Care Management   CCM RN Visit Note  09/23/2021 Name: Shawn Meza MRN: [id number] DOB: 1945-08-26  Subjective: Shawn Meza is a 76 y.o. year old male who is a primary care patient of [name], DO. The care management team was consulted for assistance with disease management and care coordination needs.    Engaged with patient by telephone for follow up visit in response to provider referral for case management and/or care coordination services.   Consent to Services:  The patient was given the following information about Chronic Care Management services today, agreed to services, and gave verbal consent: 1. CCM service includes personalized support from designated clinical staff supervised by the primary care provider, including individualized plan of care and coordination with other care providers 2. 24/7 contact phone numbers for assistance for urgent and routine care needs. 3. Service will only be billed when office clinical staff spend 20 minutes or more in a month to coordinate care. 4. Only one practitioner may furnish and bill the service in a calendar month. 5.The patient may stop CCM services at any time (effective at the end of the month) by phone call to the office staff. 6. The patient will be responsible for cost sharing (co-pay) of up to 20% of the service fee (after annual deductible is met). Patient agreed to services and consent obtained.  Patient agreed to services and verbal consent obtained.   Assessment: Review of patient past medical history, allergies, medications, health status, including review of consultants reports, laboratory and other test data, was performed as part of comprehensive evaluation and provision of chronic care management services.   SDOH (Social Determinants of Health) assessments and interventions performed:  SDOH Interventions    Flowsheet Row Most Recent Value  SDOH Interventions   Food Insecurity Interventions  Intervention Not Indicated  Transportation Interventions Other (Comment)  [referral to care guide]  Depression Interventions/Treatment  Medication  [reports had counseling once about 6 months ago]        CCM Care Plan  Allergies  Allergen Reactions   Testosterone Other (See Comments)    ABDOMINAL PAIN and cramping   Fluoxetine Other (See Comments)    Caused depression and aggression   Requip [Ropinirole] Nausea Only   Tizanidine Hcl Hives    Outpatient Encounter Medications as of 09/23/2021  Medication Sig   aspirin 81 MG EC tablet Take 1 tablet (81 mg total) by mouth daily.   carbidopa-levodopa (SINEMET CR) 50-200 MG tablet Take 1 tablet by mouth at bedtime.   Carbidopa-Levodopa ER (SINEMET CR) 25-100 MG tablet controlled release 2 at 7am, 2 at 11am, 1 at 4pm   Cholecalciferol (D3-1000) 25 MCG (1000 UT) capsule Take 1,000 Units by mouth daily.   diclofenac Sodium (VOLTAREN) 1 % GEL Apply 2 g topically 4 (four) times daily.   esomeprazole (NEXIUM) 40 MG capsule Take 1 capsule (40 mg total) by mouth at bedtime.   FLOVENT HFA 110 MCG/ACT inhaler Inhale 1 puff into the lungs daily as needed (wheezing, shortness of breath).   fluocinonide-emollient (LIDEX-E) 0.05 % cream Apply 1 application topically 2 (two) times daily. (Patient taking differently: Apply 1 application topically 2 (two) times daily as needed (to affected areas).)   furosemide (LASIX) 40 MG tablet Take 1 tablet (40 mg total) by mouth 3 (three) times a week.   gabapentin (NEURONTIN) 100 MG capsule Take 1 capsule (100 mg total) by mouth at bedtime.   Lidocaine (HM LIDOCAINE PATCH) 4 % PTCH Apply  1 patch topically every 12 (twelve) hours as needed.   metoprolol tartrate (LOPRESSOR) 25 MG tablet Take 1 tablet (25 mg total) by mouth daily.   Multiple Vitamin (MULTI-VITAMIN) tablet Take 1 tablet by mouth daily.   nitroGLYCERIN (NITROSTAT) 0.4 MG SL tablet Place 1 tablet (0.4 mg total) under the tongue every 5 (five) minutes x 3  doses as needed for chest pain.   pravastatin (PRAVACHOL) 20 MG tablet Take 1 tablet (20 mg total) by mouth daily.   prednisoLONE acetate (PRED FORTE) 1 % ophthalmic suspension Place 1 drop into both eyes 2 (two) times daily.   sacubitril-valsartan (ENTRESTO) 24-26 MG Take 1 tablet by mouth daily.   vortioxetine HBr (TRINTELLIX) 5 MG TABS tablet Take 1 tablet (5 mg total) by mouth daily.   tiZANidine (ZANAFLEX) 4 MG tablet Take 1 tablet (4 mg total) by mouth every 6 (six) hours as needed for muscle spasms. (Patient not taking: Reported on 09/16/2021)   No facility-administered encounter medications on file as of 09/23/2021.    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   Diarrhea 11/04/2020   Rectal bleeding 11/04/2020   Trochanteric bursitis of right hip    Transient ischemic attack    NSTEMI (non-ST elevated myocardial infarction) ([place])    Nephrolithiasis    Morbid obesity ([place])    Metabolic syndrome    Major depression, chronic    Long-term use of aspirin therapy    Hypercholesterolemia    Hiatal hernia    GERD (gastroesophageal reflux disease)    Essential tremor    Essential hypertension    Erectile dysfunction    Elevated PSA    Colon cancer ([place])    Chronic coronary artery disease    Arthritis    Myofascial pain 12/20/2019   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   Low back pain 10/28/2019   Radiculopathy, lumbar region 10/28/2019   Fatigue 42/68/3419   Chronic systolic (congestive) heart failure ([place]) 09/18/2018   Ischemic cardiomyopathy 09/16/2018   Aortic regurgitation 09/16/2018   Cardiomyopathy, unspecified ([place]) 06/13/2018   Abscess of right axilla 12/12/2017   BMI 33.0-33.9,adult 12/12/2017   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   Acute blood loss anemia 10/25/2017   Acute postoperative respiratory insufficiency 10/25/2017   Postoperative delirium 10/25/2017   Dyslipidemia 10/17/2017   SOB  (shortness of breath) 03/31/2017   Long term current use of aspirin 03/29/2017   Parkinson's disease ([place]) 01/02/2017   Morbid (severe) obesity due to excess calories ([place]) 10/27/2015   Memory change 07/06/2015   Depression, recurrent ([place]) 07/06/2015   Medial meniscus tear 10/11/2011   Neuroma of foot 10/11/2011   Coronary artery disease involving native coronary artery of native heart with angina pectoris ([place]) 12/28/2010   OTHER TESTICULAR HYPOFUNCTION 05/20/2010   Mixed hyperlipidemia 05/20/2010   Hypertensive heart disease with heart failure ([place]) 05/20/2010   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 05/20/2010   GERD 05/20/2010   TRANSIENT ISCHEMIC ATTACK, HX OF 05/20/2010   NEPHROLITHIASIS, HX OF 05/20/2010   BENIGN PROSTATIC HYPERTROPHY, HX OF, S/P TURP 05/20/2010    Conditions to be addressed/monitored: Chronic Pain, Parkinson's Disease and Depression  Care Plan : RN Case Manager Plan of Care  Updates made by Luretha Rued, RN since 09/23/2021 12:00 AM     Problem: Chronic Disease Managment Education and/or Care Coordination needs   Priority: High     Long-Range Goal: Development of plan of care for  Chronic Disease Management and/or care coordination needs   Start Date: 09/23/2021  Expected End Date: 03/23/2022  Priority: High  Note:   Current Barriers: Lives alone. Limited support. Mr. Rock reports he has a brother who is supportive, but his brother lives in [place]. Reports primary concern is sciatic pain to right leg.  reports "8" on the pain scale, but was a "12" prior to injection. He reports it is better, but the pain continues to have an affect on activities of daily living. Medications reviewed. Mr. Marzella reports he does not take Sinemet as prescribed due to side effects, "too foggy and sleepy". He acknowledges he is having some difficulty with managing medications and is receptive to clinical pharmacist referral. Knowledge Deficits related to plan of care for  management of Chronic Pain and Movement Disorder Parkinson's Disease  Care Coordination needs related to Transportation and Medication Reconciliation and Medication management  PHQ2 6 PHQ9 -18. Receptive to social work consult Limited social support  RNCM Clinical Goal(s):  Patient will verbalize understanding of plan for management of Chronic Pain, Movement Disorder Parkinson's Disease and Mental Health Concerns as evidenced by self report and/or chart notation take all medications exactly as prescribed and will call provider for medication related questions as evidenced by self report and/or chart notation    through collaboration with RN Care manager, provider, and care team.   Interventions: 1:1 collaboration with primary care provider regarding development and update of comprehensive plan of care as evidenced by provider attestation and co-signature Inter-disciplinary care team collaboration (see longitudinal plan of care) Evaluation of current treatment plan related to  self management and patient's adherence to plan as established by provider  Pain Interventions:  (Status:  New goal.) Long Term Goal Pain assessment performed Medications reviewed Reviewed provider established plan for pain management Discussed importance of adherence to all scheduled medical appointments Counseled on the importance of reporting any/all new or changed pain symptoms or management strategies to pain management provider Advised patient to contact provider to update regarding uncontrolled pain   Encouraged to discuss physical therapy with provider    Movement Disorder/Parkinson's Disease Interventions (Status:  New goal.)  Long Term Goal Evaluation of current treatment plan related to  Parkinson's Disease, Chronic Pain , Transportation self-management and patient's adherence to plan as established by provider. Care Guide referral for community resources, personal care service, local transportation  resources Social Work referral for  depression screen and personal care service Pharmacy referral for Medication Reconciliation and Medication Management Discussed plans with patient for ongoing care management follow up and provided patient with direct contact information for care management team  Patient Goals/Self-Care Activities: Contact your doctor to update regarding your pain, and discuss question about physical therapy Take medications as prescribed   Attend all scheduled provider appointments Call provider office for new concerns or questions  Work with the social worker to address care coordination needs and will continue to work with the clinical team to address health care and disease management related needs   Plan:Telephone follow up appointment with care management team member scheduled for:  10/14/21 The patient has been provided with contact information for the care management team and has been advised to call with any health related questions or concerns.   Thea Silversmith, RN, MSN, BSN, CCM Care Management Coordinator [facility] [phone]

## 2021-09-23 NOTE — Patient Instructions (Addendum)
Visit Information   Thank you for taking time to visit with me today. Please don't hesitate to contact me if I can be of assistance to you before our next scheduled telephone appointment.  Following are the goals we discussed today:  Patient Goals/Self-Care Activities: Contact your doctor to update regarding your pain, and discuss question about physical therapy Take medications as prescribed   Attend all scheduled provider appointments Call provider office for new concerns or questions  Work with the social worker to address care coordination needs and will continue to work with the clinical team to address health care and disease management related needs  Our next appointment is by telephone on 10/14/21 at 2:00 pm  Please call the care guide team at [phone] if you need to cancel or reschedule your appointment.   If you are experiencing a Mental Health or [place] or need someone to talk to, please call the Suicide and Crisis Lifeline: 988 call 1-800-273-TALK (toll free, 24 hour hotline)   Following is a copy of your full care plan:  Care Plan : RN Case Manager Plan of Care  Updates made by Shawn Rued, RN since 09/23/2021 12:00 AM     Problem: Chronic Disease Managment Education and/or Care Coordination needs   Priority: High     Long-Range Goal: Development of plan of care for Chronic Disease Management and/or care coordination needs   Start Date: 09/23/2021  Expected End Date: 03/23/2022  Priority: High  Note:   Current Barriers: Lives alone. Limited support. Shawn Meza reports he has a brother who is supportive, but his brother lives in [place]. Reports primary concern is sciatic pain to right leg.  reports "8" on the pain scale, but was a "12" prior to injection. He reports it is better, but the pain continues to have an affect on activities of daily living. Medications reviewed. Shawn Meza reports he does not take Sinemet as prescribed due to side  effects, "too foggy and sleepy". He acknowledges he is having some difficulty with managing medications and is receptive to clinical pharmacist referral. Knowledge Deficits related to plan of care for management of Chronic Pain and Movement Disorder Parkinson's Disease  Care Coordination needs related to Transportation and Medication Reconciliation and Medication management  PHQ2 6 PHQ9 -18. Receptive to social work consult Limited social support  RNCM Clinical Goal(s):  Patient will verbalize understanding of plan for management of Chronic Pain, Movement Disorder Parkinson's Disease and Mental Health Concerns as evidenced by self report and/or chart notation take all medications exactly as prescribed and will call provider for medication related questions as evidenced by self report and/or chart notation    through collaboration with RN Care manager, provider, and care team.   Interventions: 1:1 collaboration with primary care provider regarding development and update of comprehensive plan of care as evidenced by provider attestation and co-signature Inter-disciplinary care team collaboration (see longitudinal plan of care) Evaluation of current treatment plan related to  self management and patient's adherence to plan as established by provider  Pain Interventions:  (Status:  New goal.) Long Term Goal Pain assessment performed Medications reviewed Reviewed provider established plan for pain management Discussed importance of adherence to all scheduled medical appointments Counseled on the importance of reporting any/all new or changed pain symptoms or management strategies to pain management provider Advised patient to contact provider to update regarding uncontrolled pain   Encouraged to discuss physical therapy with provider    Movement Disorder/Parkinson's Disease Interventions (Status:  New goal.)  Long Term Goal Evaluation of current treatment plan related to  Parkinson's Disease,  Chronic Pain , Transportation self-management and patient's adherence to plan as established by provider. Care Guide referral for community resources, personal care service, local transportation resources Social Work referral for  depression screen and personal care service Pharmacy referral for Medication Reconciliation and Medication Management Discussed plans with patient for ongoing care management follow up and provided patient with direct contact information for care management team  Patient Goals/Self-Care Activities: Contact your doctor to update regarding your pain, and discuss question about physical therapy Take medications as prescribed   Attend all scheduled provider appointments Call provider office for new concerns or questions  Work with the social worker to address care coordination needs and will continue to work with the clinical team to address health care and disease management related needs     Consent to CCM Services: Shawn Meza was given information about Chronic Care Management services including:  CCM service includes personalized support from designated clinical staff supervised by his physician, including individualized plan of care and coordination with other care providers 24/7 contact phone numbers for assistance for urgent and routine care needs. Service will only be billed when office clinical staff spend 20 minutes or more in a month to coordinate care. Only one practitioner may furnish and bill the service in a calendar month. The patient may stop CCM services at any time (effective at the end of the month) by phone call to the office staff. The patient will be responsible for cost sharing (co-pay) of up to 20% of the service fee (after annual deductible is met).  Patient agreed to services and verbal consent obtained.   Per patient request to received instructions per mail. The patient verbalized understanding of instructions, educational materials, and  care plan provided today and agreed to receive a mailed copy of patient instructions, educational materials, and care plan.   The patient has been provided with contact information for the care management team and has been advised to call with any health related questions or concerns.   Thea Silversmith, RN, MSN, BSN, CCM Care Management Coordinator [facility] [phone]   Sciatica Sciatica is pain, weakness, tingling, or loss of feeling (numbness) along the sciatic nerve. The sciatic nerve starts in the lower back and goes down the back of each leg. Sciatica usually goes away on its own or with treatment. Sometimes, sciatica may come back (recur). What are the causes? This condition happens when the sciatic nerve is pinched or has pressure put on it. This may be the result of: A disk in between the bones of the spine bulging out too far (herniated disk). Changes in the spinal disks that occur with aging. A condition that affects a muscle in the butt. Extra bone growth near the sciatic nerve. A break (fracture) of the area between your hip bones (pelvis). Pregnancy. Tumor. This is rare. What increases the risk? You are more likely to develop this condition if you: Play sports that put pressure or stress on the spine. Have poor strength and ease of movement (flexibility). Have had a back injury in the past. Have had back surgery. Sit for long periods of time. Do activities that involve bending or lifting over and over again. Are very overweight (obese). What are the signs or symptoms? Symptoms can vary from mild to very bad. They may include: Any of these problems in the lower back, leg, hip, or butt: Mild  tingling, loss of feeling, or dull aches. Burning sensations. Sharp pains. Loss of feeling in the back of the calf or the sole of the foot. Leg weakness. Very bad back pain that makes it hard to move. These symptoms may get worse when you cough, sneeze, or laugh.  They may also get worse when you sit or stand for long periods of time. How is this treated? This condition often gets better without any treatment. However, treatment may include: Changing or cutting back on physical activity when you have pain. Doing exercises and stretching. Putting ice or heat on the affected area. Medicines that help: To relieve pain and swelling. To relax your muscles. Shots (injections) of medicines that help to relieve pain, irritation, and swelling. Surgery. Follow these instructions at home: Medicines Take over-the-counter and prescription medicines only as told by your doctor. Ask your doctor if the medicine prescribed to you: Requires you to avoid driving or using heavy machinery. Can cause trouble pooping (constipation). You may need to take these steps to prevent or treat trouble pooping: Drink enough fluids to keep your pee (urine) pale yellow. Take over-the-counter or prescription medicines. Eat foods that are high in fiber. These include beans, whole grains, and fresh fruits and vegetables. Limit foods that are high in fat and sugar. These include fried or sweet foods. Managing pain   If told, put ice on the affected area. Put ice in a plastic bag. Place a towel between your skin and the bag. Leave the ice on for 20 minutes, 2-3 times a day. If told, put heat on the affected area. Use the heat source that your doctor tells you to use, such as a moist heat pack or a heating pad. Place a towel between your skin and the heat source. Leave the heat on for 20-30 minutes. Remove the heat if your skin turns bright red. This is very important if you are unable to feel pain, heat, or cold. You may have a greater risk of getting burned. Activity  Return to your normal activities as told by your doctor. Ask your doctor what activities are safe for you. Avoid activities that make your symptoms worse. Take short rests during the day. When you rest for a long  time, do some physical activity or stretching between periods of rest. Avoid sitting for a long time without moving. Get up and move around at least one time each hour. Exercise and stretch regularly, as told by your doctor. Do not lift anything that is heavier than 10 lb (4.5 kg) while you have symptoms of sciatica. Avoid lifting heavy things even when you do not have symptoms. Avoid lifting heavy things over and over. When you lift objects, always lift in a way that is safe for your body. To do this, you should: Bend your knees. Keep the object close to your body. Avoid twisting. General instructions Stay at a healthy weight. Wear comfortable shoes that support your feet. Avoid wearing high heels. Avoid sleeping on a mattress that is too soft or too hard. You might have less pain if you sleep on a mattress that is firm enough to support your back. Keep all follow-up visits as told by your doctor. This is important. Contact a doctor if: You have pain that: Wakes you up when you are sleeping. Gets worse when you lie down. Is worse than the pain you have had in the past. Lasts longer than 4 weeks. You lose weight without trying. Get help right away  if: You cannot control when you pee (urinate) or poop (have a bowel movement). You have weakness in any of these areas and it gets worse: Lower back. The area between your hip bones. Butt. Legs. You have redness or swelling of your back. You have a burning feeling when you pee. Summary Sciatica is pain, weakness, tingling, or loss of feeling (numbness) along the sciatic nerve. This condition happens when the sciatic nerve is pinched or has pressure put on it. Sciatica can cause pain, tingling, or loss of feeling (numbness) in the lower back, legs, hips, and butt. Treatment often includes rest, exercise, medicines, and putting ice or heat on the affected area. This information is not intended to replace advice given to you by your health  care provider. Make sure you discuss any questions you have with your health care provider. Document Revised: 08/06/2018 Document Reviewed: 08/06/2018 Elsevier Patient Education  [place].

## 2021-09-24 ENCOUNTER — Telehealth: Payer: Self-pay | Admitting: *Deleted

## 2021-09-24 ENCOUNTER — Telehealth: Payer: Self-pay

## 2021-09-24 NOTE — Chronic Care Management (AMB) (Signed)
°  Chronic Care Management   Note  09/24/2021 Name: Shawn Meza MRN: [id number] DOB: Dec 26, 1945  Shawn Meza is a 76 y.o. year old male who is a primary care patient of [name], DO. Shawn Meza is currently enrolled in care management services. An additional referral for Licensed Clinical SW and Pharm D was placed.   Follow up plan: Telephone appointment with care management team member scheduled for: 10/04/2021 and 10/11/2021  Julian [place], [place] Management  Direct Dial: [phone]

## 2021-09-24 NOTE — Telephone Encounter (Signed)
° °  Telephone encounter was:  Successful.  09/24/2021 Name: Shawn Meza MRN: [id number] DOB: Nov 24, 1945  Shawn Meza is a 76 y.o. year old male who is a primary care patient of [name], DO . The community resource team was consulted for assistance with Transportation Needs   Care guide performed the following interventions: Patient provided with information about care guide support team and interviewed to confirm resource needs. Patient stated he would call his insurance transportation on his own. He has my contact information if he needs other transportation Follow Up Plan:  No further follow up planned at this time. The patient has been provided with needed resources.    [place], Care Management  [phone] [address], [place], [place] [address] Phone: [phone] Email: [email or web address]

## 2021-09-26 NOTE — Progress Notes (Signed)
Cardiology Office Note:    Date:  09/27/2021   ID:  Shawn Meza, DOB August 25, 1945, MRN [id number]  PCP:  [name], DO  Cardiologist:  Shirlee More, MD    Referring MD: [name]*    ASSESSMENT:    1. Coronary artery disease involving native coronary artery of native heart with angina pectoris ([place])   2. Chronic systolic (congestive) heart failure ([place])   3. Hypertensive heart disease with heart failure ([place])   4. Parkinson's disease ([place])   5. Mixed hyperlipidemia   6. Statin intolerance    PLAN:    In order of problems listed above:  Overall from a cardiology perspective Shawn Meza is done well having no angina after bypass surgery on current treatment including aspirin low-dose beta-blocker and a low intensity statin being careful to avoid muscle side effects. Stable continue current treatment he is on maximally tolerated dose of beta-blocker and Entresto with his neurologic disease.  Continue his current loop diuretic Continue his low intensity statin we will recheck a lipid profile and CMP today   Next appointment: 6 months   Medication Adjustments/Labs and Tests Ordered: Current medicines are reviewed at length with the patient today.  Concerns regarding medicines are outlined above.  No orders of the defined types were placed in this encounter.  No orders of the defined types were placed in this encounter.   Chief Complaint  Patient presents with   Follow-up   Coronary Artery Disease   Congestive Heart Failure    History of Present Illness:    Shawn Meza is a 76 y.o. male with a hx of CAD and CABG March 2019 hypertensive heart disease with EF 35 to 40% hyperlipidemia idiopathic Parkinson's disease and autonomic dysfunction with orthostatic hypotension.  He was last seen 03/25/2021.  Compliance with diet, lifestyle and medications: Yes  Is been troubled with sciatic pain but is improved and is taking his Sinemet as needed. From a  cardiology perspective doing well Cobie has had no shortness of breath edema chest pain palpitation or syncope. He tolerates a low intensity statin without muscle pain or weakness He takes his diuretic 3 days a week He has had no hypotension or lightheadedness. 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   Diarrhea 11/04/2020   Elevated PSA    Erectile dysfunction    Essential hypertension    Essential tremor    GERD (gastroesophageal reflux disease)    Headache(784.0)    Hiatal hernia    Hypercholesterolemia    Long-term use of aspirin therapy    Low back pain 10/28/2019   Major depression, chronic    Medial meniscus tear 11/07/1446   Metabolic syndrome    Morbid obesity ([place])    Myofascial pain 12/20/2019   Nephrolithiasis    hx of   NSTEMI (non-ST elevated myocardial infarction) ([place])    Parkinson's disease ([place])    S/P CABG (coronary artery bypass graft)    Transient ischemic attack    hx of   Trochanteric bursitis of right hip     Past Surgical History:  Procedure Laterality Date   CARDIAC CATHETERIZATION  5/12,1/13   4 stents placed   COLON SURGERY     CORONARY ARTERY BYPASS GRAFT     KNEE ARTHROSCOPY  10/11/2011   Procedure: ARTHROSCOPY KNEE;  Surgeon: Lorn Junes, MD;  Location: [facility];  Service: Orthopedics;  Laterality: Left;  Left Knee Arthroscopy with Medial and Lateral Partial Menisectomy, Chondroplasty   LEFT HEART CATHETERIZATION WITH CORONARY ANGIOGRAM N/A 08/25/2011   Procedure: LEFT HEART CATHETERIZATION WITH CORONARY ANGIOGRAM;  Surgeon: Burnell Blanks, MD;  Location: [facility] C[facility] LAB;  Service: Cardiovascular;  Laterality: N/A;   LITHOTRIPSY     STERIOD INJECTION  10/11/2011   Procedure: STEROID INJECTION;  Surgeon: Lorn Junes, MD;  Location: [facility];  Service:  Orthopedics;  Laterality: Right;  Steroid Injection Second Toe   TRANSURETHRAL RESECTION OF PROSTATE     URETHRAL DILATION      Current Medications: Current Meds  Medication Sig   aspirin 81 MG EC tablet Take 1 tablet (81 mg total) by mouth daily.   carbidopa-levodopa (SINEMET CR) 50-200 MG tablet Take 1 tablet by mouth at bedtime.   Carbidopa-Levodopa ER (SINEMET CR) 25-100 MG tablet controlled release 2 at 7am, 2 at 11am, 1 at 4pm   esomeprazole (NEXIUM) 40 MG capsule Take 1 capsule (40 mg total) by mouth at bedtime.   FLOVENT HFA 110 MCG/ACT inhaler Inhale 1 puff into the lungs daily as needed (wheezing, shortness of breath).   fluocinonide-emollient (LIDEX-E) 0.05 % cream Apply 1 application topically 2 (two) times daily. (Patient taking differently: Apply 1 application topically 2 (two) times daily as needed (to affected areas).)   furosemide (LASIX) 40 MG tablet Take 1 tablet (40 mg total) by mouth 3 (three) times a week.   gabapentin (NEURONTIN) 100 MG capsule Take 1 capsule (100 mg total) by mouth at bedtime.   Lidocaine (HM LIDOCAINE PATCH) 4 % PTCH Apply 1 patch topically every 12 (twelve) hours as needed.   metoprolol tartrate (LOPRESSOR) 25 MG tablet Take 1 tablet (25 mg total) by mouth daily.   Multiple Vitamin (MULTI-VITAMIN) tablet Take 1 tablet by mouth daily.   nitroGLYCERIN (NITROSTAT) 0.4 MG SL tablet Place 1 tablet (0.4 mg total) under the tongue every 5 (five) minutes x 3 doses as needed for chest pain.   pravastatin (PRAVACHOL) 20 MG tablet Take 1 tablet (20 mg total) by mouth daily.   prednisoLONE acetate (PRED FORTE) 1 % ophthalmic suspension Place 1 drop into both eyes 2 (two) times daily.   sacubitril-valsartan (ENTRESTO) 24-26 MG Take 1 tablet by mouth daily.   tiZANidine (ZANAFLEX) 4 MG tablet Take 1 tablet (4 mg total) by mouth every 6 (six) hours as needed for muscle spasms.   vortioxetine HBr (TRINTELLIX) 5 MG TABS tablet Take 1 tablet (5 mg total) by mouth  daily.     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   Caffeine use: none  Social Determinants of Health   Financial Resource Strain: Not on file  Food Insecurity: No Food Insecurity   Worried About Charity fundraiser in the Last Year: Never true   Ran Out of Food in the Last Year: Never true  Transportation Needs: Unmet Transportation Needs   Lack of Transportation (Medical): Yes   Lack of Transportation (Non-Medical): No  Physical Activity: Not on file  Stress: Not on file  Social Connections: Not on file     Family History: The patient's family history includes Arthritis in an other family member; Coronary artery disease in an other family member; Healthy in his daughter; Heart disease in his brother, father, and mother; Hyperlipidemia in his brother, father, and another family member; Hypertension in an other family member; Stroke in his father. There is no history of Dementia. ROS:   Please see the history of present illness.    All other systems reviewed and are negative.  EKGs/Labs/Other Studies Reviewed:    The following studies were reviewed today:    Recent Labs: 11/18/2020: NT-Pro BNP 320 03/25/2021: ALT 20; BUN 15; Creatinine, Ser 0.93; Potassium 4.5; Sodium 142  Recent Lipid Panel    Component Value Date/Time   CHOL 195  03/25/2021 1644   TRIG 174 (H) 03/25/2021 1644   TRIG 107 05/09/2010 0000   HDL 39 (L) 03/25/2021 1644   CHOLHDL 5.0 03/25/2021 1644   CHOLHDL 3.6 05/20/2015 1410   VLDL 27 05/20/2015 1410   LDLCALC 125 (H) 03/25/2021 1644    Physical Exam:    VS:  BP 120/60 (BP Location: Left Arm)    Pulse 92    Ht 5\' 5"  (1.651 m)    Wt 205 lb (93 kg)    SpO2 96%    BMI 34.11 kg/m     Wt Readings from Last 3 Encounters:  09/27/21 205 lb (93 kg)  09/16/21 210 lb (95.3 kg)  04/08/21 210 lb (95.3 kg)     GEN:  Well nourished, well developed in no acute distress he has a marked tremor from Parkinson's disease HEENT: Normal NECK: No JVD; No carotid bruits LYMPHATICS: No lymphadenopathy CARDIAC: RRR, no murmurs, rubs, gallops RESPIRATORY:  Clear to auscultation without rales, wheezing or rhonchi  ABDOMEN: Soft, non-tender, non-distended MUSCULOSKELETAL:  No edema; No deformity  SKIN: Warm and dry NEUROLOGIC:  Alert and oriented x 3 PSYCHIATRIC:  Normal affect    Signed, Shirlee More, MD  09/27/2021 2:30 PM    [place]

## 2021-09-27 ENCOUNTER — Other Ambulatory Visit: Payer: Self-pay

## 2021-09-27 ENCOUNTER — Ambulatory Visit (INDEPENDENT_AMBULATORY_CARE_PROVIDER_SITE_OTHER): Payer: Medicare Other | Admitting: Cardiology

## 2021-09-27 ENCOUNTER — Encounter: Payer: Self-pay | Admitting: Cardiology

## 2021-09-27 VITALS — BP 120/60 | HR 92 | Ht 65.0 in | Wt 205.0 lb

## 2021-09-27 DIAGNOSIS — I5022 Chronic systolic (congestive) heart failure: Secondary | ICD-10-CM

## 2021-09-27 DIAGNOSIS — Z789 Other specified health status: Secondary | ICD-10-CM

## 2021-09-27 DIAGNOSIS — E782 Mixed hyperlipidemia: Secondary | ICD-10-CM

## 2021-09-27 DIAGNOSIS — I25119 Atherosclerotic heart disease of native coronary artery with unspecified angina pectoris: Secondary | ICD-10-CM

## 2021-09-27 DIAGNOSIS — I110 Hypertensive heart disease with heart failure: Secondary | ICD-10-CM | POA: Diagnosis not present

## 2021-09-27 DIAGNOSIS — G20 Parkinson's disease: Secondary | ICD-10-CM | POA: Diagnosis not present

## 2021-09-27 NOTE — Patient Instructions (Signed)
Medication Instructions:  Your physician recommends that you continue on your current medications as directed. Please refer to the Current Medication list given to you today.  *If you need a refill on your cardiac medications before your next appointment, please call your pharmacy*   Lab Work: Your physician recommends that you return for lab work in: Today for [place] and Lipid Panel  If you have labs (blood work) drawn today and your tests are completely normal, you will receive your results only by: MyChart Message (if you have MyChart) OR A paper copy in the mail If you have any lab test that is abnormal or we need to change your treatment, we will call you to review the results.   Testing/Procedures: None   Follow-Up: At [facility], you and your health needs are our priority.  As part of our continuing mission to provide you with exceptional heart care, we have created designated Provider Care Teams.  These Care Teams include your primary Cardiologist (physician) and Advanced Practice Providers (APPs -  Physician Assistants and Nurse Practitioners) who all work together to provide you with the care you need, when you need it.  We recommend signing up for the patient portal called "MyChart".  Sign up information is provided on this After Visit Summary.  MyChart is used to connect with patients for Virtual Visits (Telemedicine).  Patients are able to view lab/test results, encounter notes, upcoming appointments, etc.  Non-urgent messages can be sent to your provider as well.   To learn more about what you can do with MyChart, go to [email or web address].    Your next appointment:   6 month(s)  The format for your next appointment:   In Person  Provider:   Shirlee More, MD    Other Instructions

## 2021-09-27 NOTE — Addendum Note (Signed)
Addended by: Anselm Pancoast on: 09/27/2021 02:35 PM   Modules accepted: Orders

## 2021-09-28 DIAGNOSIS — G20 Parkinson's disease: Secondary | ICD-10-CM | POA: Diagnosis not present

## 2021-09-28 DIAGNOSIS — F339 Major depressive disorder, recurrent, unspecified: Secondary | ICD-10-CM | POA: Diagnosis not present

## 2021-09-28 LAB — COMPREHENSIVE METABOLIC PANEL
ALT: 18 IU/L (ref 0–44)
AST: 20 IU/L (ref 0–40)
Albumin/Globulin Ratio: 1.9 (ref 1.2–2.2)
Albumin: 4.4 g/dL (ref 3.7–4.7)
Alkaline Phosphatase: 72 IU/L (ref 44–121)
BUN/Creatinine Ratio: 17 (ref 10–24)
BUN: 17 mg/dL (ref 8–27)
Bilirubin Total: 0.4 mg/dL (ref 0.0–1.2)
CO2: 21 mmol/L (ref 20–29)
Calcium: 9.2 mg/dL (ref 8.6–10.2)
Chloride: 106 mmol/L (ref 96–106)
Creatinine, Ser: 0.98 mg/dL (ref 0.76–1.27)
Globulin, Total: 2.3 g/dL (ref 1.5–4.5)
Glucose: 94 mg/dL (ref 70–99)
Potassium: 4.4 mmol/L (ref 3.5–5.2)
Sodium: 142 mmol/L (ref 134–144)
Total Protein: 6.7 g/dL (ref 6.0–8.5)
eGFR: 79 mL/min/{1.73_m2} (ref >59)

## 2021-09-28 LAB — LIPID PANEL
Chol/HDL Ratio: 3.8 ratio (ref 0.0–5.0)
Cholesterol, Total: 160 mg/dL (ref 100–199)
HDL: 42 mg/dL (ref >39)
LDL Chol Calc (NIH): 96 mg/dL (ref 0–99)
Triglycerides: 120 mg/dL (ref 0–149)
VLDL Cholesterol Cal: 22 mg/dL (ref 5–40)

## 2021-09-29 ENCOUNTER — Ambulatory Visit (INDEPENDENT_AMBULATORY_CARE_PROVIDER_SITE_OTHER): Payer: Medicare Other | Admitting: *Deleted

## 2021-09-29 DIAGNOSIS — D3613 Benign neoplasm of peripheral nerves and autonomic nervous system of lower limb, including hip: Secondary | ICD-10-CM

## 2021-09-29 DIAGNOSIS — I255 Ischemic cardiomyopathy: Secondary | ICD-10-CM

## 2021-09-29 DIAGNOSIS — I2583 Coronary atherosclerosis due to lipid rich plaque: Secondary | ICD-10-CM

## 2021-09-29 DIAGNOSIS — M5416 Radiculopathy, lumbar region: Secondary | ICD-10-CM

## 2021-09-29 DIAGNOSIS — R413 Other amnesia: Secondary | ICD-10-CM

## 2021-09-29 DIAGNOSIS — F339 Major depressive disorder, recurrent, unspecified: Secondary | ICD-10-CM

## 2021-09-29 DIAGNOSIS — G20 Parkinson's disease: Secondary | ICD-10-CM

## 2021-09-29 DIAGNOSIS — E785 Hyperlipidemia, unspecified: Secondary | ICD-10-CM

## 2021-09-29 DIAGNOSIS — Z87442 Personal history of urinary calculi: Secondary | ICD-10-CM

## 2021-09-29 DIAGNOSIS — I2510 Atherosclerotic heart disease of native coronary artery without angina pectoris: Secondary | ICD-10-CM

## 2021-10-01 ENCOUNTER — Telehealth: Payer: Self-pay | Admitting: Pharmacist

## 2021-10-01 ENCOUNTER — Encounter: Payer: Self-pay | Admitting: *Deleted

## 2021-10-01 NOTE — Chronic Care Management (AMB) (Signed)
? ? ?Chronic Care Management ?Pharmacy Assistant  ? ?Name: PHU RECORD  MRN: [id number] DOB: Oct 13, 1945 ? ?Shawn Meza is an 76 y.o. year old male who presents for his initial CCM visit with the clinical pharmacist. ? ?Recent office visits:  ?08/13/21-Nicholas Nolene Ebbs, DO (PCP, Video visit) Seen for back pain. Start on Voltaren 1% gel and Tizanidine 4 mg. Follow up in 1 month. ? ?Recent consult visits:  ?09/27/21-Brian Mora Bellman, MD (Cardiology) Cardiac follow up visit. Follow up in 6 months. ?09/16/21-Jeremy Hinton Dyer, MD (Sports medicine) Seen for acute on chronic ride sided radicular pain. ?08/23/21-Misty A. Taylor-Paladino, LCSW. (Neurology, Video visit) Seen for depression. Follow up in 4 weeks. ?04/29/21-Dave S. Eichman (Anesthesiology) Notes not available.  ?04/08/21-Rebecca S. [name], DO (Neurology) Seen for parkinson's diease. Follow up in 6 months. ? ?Hospital visits:  ?Admitted to the hospital on 08/05/21 due to Leg pain. Discharge date was 08/06/21. Discharged from [facility].   ? ?New?Medications Started at [facility] Discharge:?? ?-started  ?predniSONE (DELTASONE) 20 MG tablet  Daily      ?Lidocaine 4% patch every 12 hours prn ? ?Medication Changes at Hospital Discharge: ?-Changed None noted ? ?Medications Discontinued at Hospital Discharge: ?-Stopped None noted ? ?Medications that remain the same after Hospital Discharge:??  ?-All other medications will remain the same.   ? ?Medications: ?Outpatient Encounter Medications as of 10/01/2021  ?Medication Sig  ? aspirin 81 MG EC tablet Take 1 tablet (81 mg total) by mouth daily.  ? carbidopa-levodopa (SINEMET CR) 50-200 MG tablet Take 1 tablet by mouth at bedtime.  ? Carbidopa-Levodopa ER (SINEMET CR) 25-100 MG tablet controlled release 2 at 7am, 2 at 11am, 1 at 4pm  ? Cholecalciferol (D3-1000) 25 MCG (1000 UT) capsule Take 1,000 Units by mouth daily. (Patient not taking: Reported on 09/27/2021)  ? diclofenac Sodium (VOLTAREN) 1 % GEL  Apply 2 g topically 4 (four) times daily. (Patient not taking: Reported on 09/27/2021)  ? esomeprazole (NEXIUM) 40 MG capsule Take 1 capsule (40 mg total) by mouth at bedtime.  ? FLOVENT HFA 110 MCG/ACT inhaler Inhale 1 puff into the lungs daily as needed (wheezing, shortness of breath).  ? fluocinonide-emollient (LIDEX-E) 0.05 % cream Apply 1 application topically 2 (two) times daily. (Patient taking differently: Apply 1 application topically 2 (two) times daily as needed (to affected areas).)  ? furosemide (LASIX) 40 MG tablet Take 1 tablet (40 mg total) by mouth 3 (three) times a week.  ? gabapentin (NEURONTIN) 100 MG capsule Take 1 capsule (100 mg total) by mouth at bedtime.  ? Lidocaine (HM LIDOCAINE PATCH) 4 % PTCH Apply 1 patch topically every 12 (twelve) hours as needed.  ? metoprolol tartrate (LOPRESSOR) 25 MG tablet Take 1 tablet (25 mg total) by mouth daily.  ? Multiple Vitamin (MULTI-VITAMIN) tablet Take 1 tablet by mouth daily.  ? nitroGLYCERIN (NITROSTAT) 0.4 MG SL tablet Place 1 tablet (0.4 mg total) under the tongue every 5 (five) minutes x 3 doses as needed for chest pain.  ? pravastatin (PRAVACHOL) 20 MG tablet Take 1 tablet (20 mg total) by mouth daily.  ? prednisoLONE acetate (PRED FORTE) 1 % ophthalmic suspension Place 1 drop into both eyes 2 (two) times daily.  ? sacubitril-valsartan (ENTRESTO) 24-26 MG Take 1 tablet by mouth daily.  ? tiZANidine (ZANAFLEX) 4 MG tablet Take 1 tablet (4 mg total) by mouth every 6 (six) hours as needed for muscle spasms.  ? vortioxetine HBr (TRINTELLIX) 5 MG TABS tablet  Take 1 tablet (5 mg total) by mouth daily.  ? ?No facility-administered encounter medications on file as of 10/01/2021.  ? ?Aspirin 81 MG EC tablet Last filled:None noted ?Carbidopa-levodopa (SINEMET CR) 50-200 MG tablet Last filled:09/07/21 30 DS ?Carbidopa-Levodopa ER (SINEMET CR) 25-100 MG tablet controlled release Last filled:09/07/21 30 DS ?Cholecalciferol (D3-1000) 25 MCG (1000 UT) capsule Last  filled:None noted ?Diclofenac Sodium (VOLTAREN) 1 % GEL Last filled:08/13/21 25 DS ?Esomeprazole (NEXIUM) 40 MG capsule Last filled:09/07/21 30 DS ?FLOVENT HFA 110 MCG/ACT inhaler Last filled:None noted ?Fluocinonide-emollient (LIDEX-E) 0.05 % cream Last filled:03/04/19 0 DS ?Furosemide (LASIX) 40 MG tablet Last filled:09/07/21 30 DS ?Gabapentin (NEURONTIN) 100 MG capsule Last filled:03/08/21 30 DS ?Lidocaine (HM LIDOCAINE PATCH) 4 % PTCH Last filled:None noted ?Metoprolol tartrate (LOPRESSOR) 25 MG tablet Last filled:09/07/21 30 DS ?NitroGLYCERIN (NITROSTAT) 0.4 MG SL Last filled:02/09/21 30 DS ?Pravastatin (PRAVACHOL) 20 MG tablet Last filled:09/07/21 30 DS ?PrednisoLONE acetate (PRED FORTE) 1 % ophthalmic suspension Last filled:06/17/20 21 DS ?Sacubitril-valsartan (ENTRESTO) 24-26 MG Last filled:09/07/21 30 DS ?TiZANidine (ZANAFLEX) 4 MG tablet Last filled:08/13/20 8 DS ?Vortioxetine HBr (TRINTELLIX) 5 MG TABS tablet Last filled:09/07/21 30 DS ? ? ?Care Gaps: ?Hepatitis C Screening:Never done ?Zoster Vaccines- Shingrix:Never done ?HLKTG-25 Vaccine:Last completed: Oct 22, 2019 ?INFLUENZA VACCINE:Last completed: May 06, 2019 ? ?Star Rating Drugs: ?Pravastatin 20 mg Last filled:09/07/21 30 DS ?Sacubitril-valsartan 24-26 mg Last filled:09/07/21 30 DS ? ?Corrie Mckusick, RMA ?Health Concierge ? ?

## 2021-10-01 NOTE — Patient Instructions (Signed)
Visit Information  ? ?Thank you for taking time to visit with me today. Please don't hesitate to contact me if I can be of assistance to you before our next scheduled telephone appointment. ? ?Following are the goals we discussed today:  ? ?Patient Goals/Self-Care Activities: ?Begin personal counseling with LCSW, on a bi-weekly basis, to reduce and manage symptoms of Depression and Anxiety, until well-established with [place]. ?Accept all calls from representative with [facility], in an effort to establish ongoing mental health counseling and supportive services. ?Incorporate into daily practice - relaxation techniques, deep breathing exercises, and mindfulness meditation strategies. ?Review Statement in Support of Claim, Physician Statement for Aid and Attendance, How to Apply for Aid and Attendance Benefits, Authorization to [employer], and Aid and Attendance Benefits Application, and be prepared to complete with LCSW during next scheduled telephone outreach call. ?Continue with compliance of taking prescription medications, try to obtain adequate rest, stay well-hydrated and eat a healthy, well-balanced diet. ?Collaboration with Althea Charon, LCSW with [facility] [phone]), to confirm follow-up appointment for psychotherapeutic services. ? ~ HIPAA compliant message left on voicemail.  Awaiting return call. ?Contact LCSW directly (# [phone]), if you have questions, need assistance, or if additional social work needs are identified between now and our next scheduled telephone outreach call. ?Follow-Up Date:  10/11/2021 at 1:00 pm ? ?Please call the care guide team at [phone] if you need to cancel or reschedule your appointment.  ? ?If you are experiencing a Mental Health or [place] or need someone to talk to, please call the Suicide and Crisis Lifeline: 988 ?call the [place] National Suicide Prevention Lifeline: 8381077241 or TTY:  218-841-4135 TTY (701)836-1211) to talk to a trained counselor ?call 1-800-273-TALK (toll free, 24 hour hotline) ?go to [facility] [address] [place] [phone]) ?call the [facility]: [phone] ?call 911  ? ?Following is a copy of your full care plan:  ?Care Plan : [place]  ?Updates made by Francis Gaines, LCSW since 10/01/2021 12:00 AM  ?  ? ?Problem: Reduce and Manage My Symptoms of Anxiety and Depression.   ?Priority: High  ?  ? ?Goal: Reduce and Manage My Symptoms of Anxiety and Depression.   ?Start Date: 09/29/2021  ?Expected End Date: 12/30/2021  ?This Visit's Progress: On track  ?Priority: High  ?Note:   ?Current Barriers:   ?Patient with Acute Mental Health Needs related to Parkinson's Disease, Anxiety, and Depression, requires Support, Education, Resources, Referrals, Advocacy, and Care Coordination, in order to meet Unmet Acute Mental Health Needs. ?Patient lacks knowledge of available community counseling agencies and resources. ?Clinical Goal(s):  ?Patient will work with LCSW, to reduce and manage symptoms of Anxiety and Depression, until well-established with a community mental health provider.   ?Patient will increase knowledge and/or ability of:  ?      Coping Skills, Healthy Habits, Self-Management Skills, Stress Reduction, Home Safety and Utilizing [employer] and Resources.   ?Interventions:  ?Assessed patient's previous treatment, needs, coping skills, current treatment, support system and barriers to care. ?Collaboration with Primary Care Provider, Dr. Audelia Acton regarding development and update of comprehensive plan of care as evidenced by provider attestation and co-signature. ?Inter-disciplinary care team collaboration (see longitudinal plan of care). ?Clinical Interventions: ?PHQ-2 and PHQ-9 Depression Screening Tool performed and results reviewed with patient. ?Suicidal Ideation/Homicidal Ideation  Assessed - none present. ?      Solution-Focused Therapy Performed. ?Mindfulness Meditation Strategies,  Relaxation Techniques, and Deep Breathing Exercises Discussed, Taught, and Encouraged Daily. ?Active Listening/Reflection Utilized. ?Verbalization of Feelings Encouraged, and Emotional Support Provided. ?Problem Solving/Task-Centered Solutions Developed. ?Brief Cognitive Behavioral Therapy Initiated. ?Mental Health Medications Reviewed, and Compliance Emphasized.   ?Quality of Sleep Assessed and Sleep Hygiene Techniques Promoted.      ?Discussed plans with patient for ongoing care management follow-up and provided patient with direct contact information for care management team. ?Discussed several options for long-term counseling based on need and insurance through [employer], and verbal consent obtained to submit referral to [employer], for ongoing mental health counseling and supportive services.  ?Referral submitted to [facility], via [facility], for long-term mental health counseling and supportive services, as well as psychotropic medication management. ?E-mailed and mailed List of Male Therapists in [place], in the event that [place] is unable to match with a local mental health provider. ?E-mailed and mailed Statement in Music therapist, Hydrographic surveyor for Aid and Attendance, How to Apply for Aid and Attendance Benefits, Authorization to [employer], and Aid and Attendance Benefits Application. ?Patient Goals/Self-Care Activities: ?Begin personal counseling with LCSW, on a bi-weekly basis, to reduce and manage symptoms of Depression and Anxiety, until well-established with [place]. ?Accept all calls from representative with [facility], in an effort to establish ongoing mental health counseling and supportive services. ?Incorporate into daily practice - relaxation techniques, deep breathing exercises, and mindfulness meditation  strategies. ?Review Statement in Support of Claim, Physician Statement for Aid and Attendance, How to Apply for Aid and Attendance Benefits, Authorization to [employer], and Aid and Attendance Benefits Application, and be prepared to complete with LCSW during next scheduled telephone outreach call. ?Continue with compliance of taking prescription medications, try to obtain adequate rest, stay well-hydrated and eat a healthy, well-balanced diet. ?Collaboration with Althea Charon, LCSW with [facility] [phone]), to confirm follow-up appointment for psychotherapeutic services. ? ~ HIPAA compliant message left on voicemail.  Awaiting return call. ?Contact LCSW directly (# [phone]), if you have questions, need assistance, or if additional social work needs are identified between now and our next scheduled telephone outreach call. ?Follow-Up Date:  10/11/2021 at 1:00 pm ? ?  ? ? ?Consent to CCM Services: ?Mr. Rayford was given information about Chronic Care Management services including:  ?CCM service includes personalized support from designated clinical staff supervised by his physician, including individualized plan of care and coordination with other care providers ?24/7 contact phone numbers for assistance for urgent and routine care needs. ?Service will only be billed when office clinical staff spend 20 minutes or more in a month to coordinate care. ?Only one practitioner may furnish and bill the service in a calendar month. ?The patient may stop CCM services at any time (effective at the end of the month) by phone call to the office staff. ?The patient will be responsible for cost sharing (co-pay) of up to 20% of the service fee (after annual deductible is met). ? ?Patient agreed to services and verbal consent obtained.  ? ?Patient verbalizes understanding of instructions and care plan provided today and agrees to view in [place]. Active MyChart status confirmed with  patient.   ? ?Telephone follow up appointment with care management team member scheduled for:  10/11/2021 at 1:00 pm. ? ?Nat Christen LCSW ?Licensed Clinical Social Worker ?[place]

## 2021-10-01 NOTE — Chronic Care Management (AMB) (Signed)
Chronic Care Management    Clinical Social Work Note  10/01/2021 Name: Shawn Meza MRN: [id number] DOB: 1945/12/07  Shawn Meza is a 76 y.o. year old male who is a primary care patient of [name], DO. The CCM team was consulted to assist the patient with chronic disease management and/or care coordination needs related to: Appointment Scheduling Needs, [employer], Mental Health Counseling and Resources, and Caregiver Stress.   Engaged with patient by telephone for initial visit in response to provider referral for social work chronic care management and care coordination services.   Consent to Services:  The patient was given information about Chronic Care Management services, agreed to services, and gave verbal consent prior to initiation of services.  Please see initial visit note for detailed documentation.   Patient agreed to services and consent obtained.   Assessment: Review of patient past medical history, allergies, medications, and health status, including review of relevant consultants reports was performed today as part of a comprehensive evaluation and provision of chronic care management and care coordination services.     SDOH (Social Determinants of Health) assessments and interventions performed:  SDOH Interventions    Flowsheet Row Most Recent Value  SDOH Interventions   Food Insecurity Interventions Intervention Not Indicated  Financial Strain Interventions Intervention Not Indicated  Housing Interventions Intervention Not Indicated  Intimate Partner Violence Interventions Intervention Not Indicated  Physical Activity Interventions Patient Refused  Stress Interventions Offered Community Wellness Resources, Provide Counseling, Other (Comment)  [Referred to [employer] for Ongoing Mental Health Counseling and Supportive Services]  Social Connections Interventions Intervention Not Indicated  Transportation Interventions Intervention Not  Indicated, Financial planner, Other (Comment)  [[employer],  Referred to [facility] for Transportation Resources]  Depression Interventions/Treatment  Referral to Psychiatry, Medication, Counseling        Advanced Directives Status: See Care Plan for related entries.  CCM Care Plan  Allergies  Allergen Reactions   Testosterone Other (See Comments)    ABDOMINAL PAIN and cramping   Fluoxetine Other (See Comments)    Caused depression and aggression   Requip [Ropinirole] Nausea Only   Tizanidine Hcl Hives    Outpatient Encounter Medications as of 09/29/2021  Medication Sig   aspirin 81 MG EC tablet Take 1 tablet (81 mg total) by mouth daily.   carbidopa-levodopa (SINEMET CR) 50-200 MG tablet Take 1 tablet by mouth at bedtime.   Carbidopa-Levodopa ER (SINEMET CR) 25-100 MG tablet controlled release 2 at 7am, 2 at 11am, 1 at 4pm   Cholecalciferol (D3-1000) 25 MCG (1000 UT) capsule Take 1,000 Units by mouth daily. (Patient not taking: Reported on 09/27/2021)   diclofenac Sodium (VOLTAREN) 1 % GEL Apply 2 g topically 4 (four) times daily. (Patient not taking: Reported on 09/27/2021)   esomeprazole (NEXIUM) 40 MG capsule Take 1 capsule (40 mg total) by mouth at bedtime.   FLOVENT HFA 110 MCG/ACT inhaler Inhale 1 puff into the lungs daily as needed (wheezing, shortness of breath).   fluocinonide-emollient (LIDEX-E) 0.05 % cream Apply 1 application topically 2 (two) times daily. (Patient taking differently: Apply 1 application topically 2 (two) times daily as needed (to affected areas).)   furosemide (LASIX) 40 MG tablet Take 1 tablet (40 mg total) by mouth 3 (three) times a week.   gabapentin (NEURONTIN) 100 MG capsule Take 1 capsule (100 mg total) by mouth at bedtime.   Lidocaine (HM LIDOCAINE PATCH) 4 % PTCH Apply 1 patch topically every 12 (twelve) hours  as needed.   metoprolol tartrate (LOPRESSOR) 25 MG tablet Take 1 tablet (25 mg total) by mouth daily.    Multiple Vitamin (MULTI-VITAMIN) tablet Take 1 tablet by mouth daily.   nitroGLYCERIN (NITROSTAT) 0.4 MG SL tablet Place 1 tablet (0.4 mg total) under the tongue every 5 (five) minutes x 3 doses as needed for chest pain.   pravastatin (PRAVACHOL) 20 MG tablet Take 1 tablet (20 mg total) by mouth daily.   prednisoLONE acetate (PRED FORTE) 1 % ophthalmic suspension Place 1 drop into both eyes 2 (two) times daily.   sacubitril-valsartan (ENTRESTO) 24-26 MG Take 1 tablet by mouth daily.   tiZANidine (ZANAFLEX) 4 MG tablet Take 1 tablet (4 mg total) by mouth every 6 (six) hours as needed for muscle spasms.   vortioxetine HBr (TRINTELLIX) 5 MG TABS tablet Take 1 tablet (5 mg total) by mouth daily.   No facility-administered encounter medications on file as of 09/29/2021.    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   Diarrhea 11/04/2020   Rectal bleeding 11/04/2020   Trochanteric bursitis of right hip    Transient ischemic attack    NSTEMI (non-ST elevated myocardial infarction) ([place])    Nephrolithiasis    Morbid obesity ([place])    Metabolic syndrome    Major depression, chronic    Long-term use of aspirin therapy    Hypercholesterolemia    Hiatal hernia    GERD (gastroesophageal reflux disease)    Essential tremor    Essential hypertension    Erectile dysfunction    Elevated PSA    Colon cancer ([place])    Chronic coronary artery disease    Arthritis    Myofascial pain 12/20/2019   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   Low back pain 10/28/2019   Radiculopathy, lumbar region 10/28/2019   Fatigue 45/80/9983   Chronic systolic (congestive) heart failure ([place]) 09/18/2018   Ischemic cardiomyopathy 09/16/2018   Aortic regurgitation 09/16/2018   Cardiomyopathy, unspecified ([place]) 06/13/2018   Abscess of right axilla 12/12/2017   BMI 33.0-33.9,adult 12/12/2017   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   Acute blood loss anemia  10/25/2017   Acute postoperative respiratory insufficiency 10/25/2017   Postoperative delirium 10/25/2017   Dyslipidemia 10/17/2017   SOB (shortness of breath) 03/31/2017   Long term current use of aspirin 03/29/2017   Parkinson's disease ([place]) 01/02/2017   Morbid (severe) obesity due to excess calories ([place]) 10/27/2015   Memory change 07/06/2015   Depression, recurrent ([place]) 07/06/2015   Medial meniscus tear 10/11/2011   Neuroma of foot 10/11/2011   Coronary artery disease involving native coronary artery of native heart with angina pectoris ([place]) 12/28/2010   OTHER TESTICULAR HYPOFUNCTION 05/20/2010   Mixed hyperlipidemia 05/20/2010   Hypertensive heart disease with heart failure ([place]) 05/20/2010   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 05/20/2010   GERD 05/20/2010   TRANSIENT ISCHEMIC ATTACK, HX OF 05/20/2010   NEPHROLITHIASIS, HX OF 05/20/2010   BENIGN PROSTATIC HYPERTROPHY, HX OF, S/P TURP 05/20/2010    Conditions to be addressed/monitored: Anxiety and Depression.  Limited Social Support, Mental Health Concerns, Social Isolation, Limited Access to Caregiver, Cognitive Deficits, Memory Deficits, and Lacks Knowledge of [employer].  Care Plan : LCSW Plan of Care  Updates made by Francis Gaines, LCSW since 10/01/2021 12:00 AM     Problem: Reduce and Manage My Symptoms of Anxiety and Depression.   Priority: High     Goal: Reduce  and Manage My Symptoms of Anxiety and Depression.   Start Date: 09/29/2021  Expected End Date: 12/30/2021  This Visit's Progress: On track  Priority: High  Note:   Current Barriers:   Patient with Acute Mental Health Needs related to Parkinson's Disease, Anxiety, and Depression, requires Support, Education, Resources, Referrals, Advocacy, and Care Coordination, in order to meet Unmet Acute Mental Health Needs. Patient lacks knowledge of available community counseling agencies and resources. Clinical Goal(s):  Patient will work with LCSW, to  reduce and manage symptoms of Anxiety and Depression, until well-established with a community mental health provider.   Patient will increase knowledge and/or ability of:        Coping Skills, Healthy Habits, Self-Management Skills, Stress Reduction, Home Safety and Utilizing [employer] and Resources.   Interventions:  Assessed patient's previous treatment, needs, coping skills, current treatment, support system and barriers to care. Collaboration with Primary Care Provider, Dr. Audelia Acton regarding development and update of comprehensive plan of care as evidenced by provider attestation and co-signature. Inter-disciplinary care team collaboration (see longitudinal plan of care). Clinical Interventions: PHQ-2 and PHQ-9 Depression Screening Tool performed and results reviewed with patient. Suicidal Ideation/Homicidal Ideation Assessed - none present.       Solution-Focused Therapy Performed. Mindfulness Meditation Strategies, Relaxation Techniques, and Deep Breathing Exercises Discussed, Taught, and Encouraged Daily. Active Listening/Reflection Utilized. Verbalization of Feelings Encouraged, and Emotional Support Provided. Problem Solving/Task-Centered Solutions Developed. Brief Cognitive Behavioral Therapy Initiated. Mental Health Medications Reviewed, and Compliance Emphasized.   Quality of Sleep Assessed and Sleep Hygiene Techniques Promoted.      Discussed plans with patient for ongoing care management follow-up and provided patient with direct contact information for care management team. Discussed several options for long-term counseling based on need and insurance through [employer], and verbal consent obtained to submit referral to [facility], for ongoing mental health counseling and supportive services.  Referral submitted to [facility], via [facility], for long-term mental health counseling and supportive services, as well as psychotropic  medication management. E-mailed and mailed [facility] in [place], in the event that [place] is unable to match with a local mental health provider. E-mailed and mailed Statement in Music therapist, Hydrographic surveyor for Aid and Attendance, How to Apply for Aid and Attendance Benefits, Authorization to [employer], and Aid and Attendance Benefits Application. Patient Goals/Self-Care Activities: Begin personal counseling with LCSW, on a bi-weekly basis, to reduce and manage symptoms of Depression and Anxiety, until well-established with [facility]. Accept all calls from representative with [facility], in an effort to establish ongoing mental health counseling and supportive services. Incorporate into daily practice - relaxation techniques, deep breathing exercises, and mindfulness meditation strategies. Review Statement in Support of Claim, Physician Statement for Aid and Attendance, How to Apply for Aid and Attendance Benefits, Authorization to [employer], and Aid and Attendance Benefits Application, and be prepared to complete with LCSW during next scheduled telephone outreach call. Continue with compliance of taking prescription medications, try to obtain adequate rest, stay well-hydrated and eat a healthy, well-balanced diet. Collaboration with Althea Charon, LCSW with [facility] [phone]), to confirm follow-up appointment for psychotherapeutic services.  ~ HIPAA compliant message left on voicemail.  Awaiting return call. Contact LCSW directly (# [phone]), if you have questions, need assistance, or if additional social work needs are identified between now and our next scheduled telephone outreach call. Follow-Up Date:  10/11/2021 at 1:00 pm    Di Kindle  [place] representative [place] [phone]

## 2021-10-04 ENCOUNTER — Ambulatory Visit: Payer: Medicare Other | Admitting: Pharmacist

## 2021-10-04 DIAGNOSIS — I2583 Coronary atherosclerosis due to lipid rich plaque: Secondary | ICD-10-CM

## 2021-10-04 DIAGNOSIS — G20 Parkinson's disease: Secondary | ICD-10-CM

## 2021-10-04 DIAGNOSIS — I2510 Atherosclerotic heart disease of native coronary artery without angina pectoris: Secondary | ICD-10-CM

## 2021-10-04 DIAGNOSIS — I255 Ischemic cardiomyopathy: Secondary | ICD-10-CM

## 2021-10-04 DIAGNOSIS — E782 Mixed hyperlipidemia: Secondary | ICD-10-CM

## 2021-10-04 NOTE — Chronic Care Management (AMB) (Signed)
Chronic Care Management Pharmacy Note  10/04/2021 Name:  Shawn Meza MRN:  [id number] DOB:  Apr 25, 1946  Summary: Patient has not been taking medications to treat Parkinson's disease as recommended. He has medication / adherence packaging from his pharmacy but reports he removes carbidopa - levodopa doses or only takes half a tablet about 1/3 of the time. He is prescribed extend release tablets of carbidopa-levodopa.He reports that he feels that the carbidopa-levodopa causes him to feel "foggy and sleepy" Patient also requested physical therapy.  Patient requested follow up with ophthalmologist (he would like to see different ophthalmologist than in past, he could not remember who he has seen before though, only that they were on [place] in [place]) Recommended yearly skin checks with dermatologist due to PD disease / treatment. Patient is agreeable to dermatology referral.  Patient asked about prescription for gabapentin to take as needed. Last filled #30 03/08/2021 Mr Blankenship has not been able to access his MyChart account and was not sure who the appointments were with that he has received text notices about.   Plan: Sent message to Dr [name] regarding missed doses and side effects patient is reporting with carbidopa-levodopa. She will discuss with him at upcoming appointment 10/08/2021.  Regarding physical therapy - Dr [name] recommended either [facility] or [facility] but for convenience patient requested [facility]. Dr [name] sent referral Reminded patient not to split extended release tablets due to change in absorption / release of medication. This might be cause of some of his symptoms.  Referral to dermatology and ophthalmology if approved by PCP Requesting refill for gabapentin from PCP - Recommended to monitor to see if he feels sleepy or "foggy" the day following taking gabapentin at night.  Assisted patient with resetting MyChart Password and also supplied list  of upcoming appointment and who they are with to his email account at his request.   Subjective: ANDRE Meza is an 76 y.o. year old male who is a primary patient of [name], DO.  The CCM team was consulted for assistance with disease management and care coordination needs.    Engaged with patient by telephone for initial visit in response to provider referral for pharmacy case management and/or care coordination services.   Consent to Services:  The patient was given the following information about Chronic Care Management services today, agreed to services, and gave verbal consent: 1. CCM service includes personalized support from designated clinical staff supervised by the primary care provider, including individualized plan of care and coordination with other care providers 2. 24/7 contact phone numbers for assistance for urgent and routine care needs. 3. Service will only be billed when office clinical staff spend 20 minutes or more in a month to coordinate care. 4. Only one practitioner may furnish and bill the service in a calendar month. 5.The patient may stop CCM services at any time (effective at the end of the month) by phone call to the office staff. 6. The patient will be responsible for cost sharing (co-pay) of up to 20% of the service fee (after annual deductible is met). Patient agreed to services and consent obtained.  Patient Care Team: [name], DO as PCP - General (Family Medicine) Richardo Priest, MD as PCP - Cardiology (Cardiology) [name], MD (General Surgery) [name], MD as Consulting Physician (General Surgery) [name], Eustace Quail, DO as Consulting Physician (Neurology) Luretha Rued, RN as Case Manager Saporito, Maree Erie, LCSW as Social  Worker (Licensed Holiday representative) Antony Contras, Forensic psychologist, [employer] manager)  Recent office visits: 08/13/21-Nicholas Nolene Ebbs, DO (PCP, Video visit) Seen for back pain. Start Voltaren 1%  gel and Tizanidine 4 mg. Follow up in 1 month.   Recent consult visits:  09/27/21-Brian Mora Bellman, MD (Cardiology) Cardiac follow up visit. Follow up in 6 months. 09/16/21-Jeremy Hinton Dyer, MD (Sports medicine) Seen for acute on chronic ride sided radicular pain. 08/23/21-Misty A. Taylor-Paladino, LCSW. (Neurology, Video visit) Seen for depression. Follow up in 4 weeks. 04/29/21-Dave S. Eichman (Anesthesiology) Notes not available.  04/08/2022 - Neurology (Dr [name]) Seen for f/u Parkinson's Disease. Continue carbidopa/levodopa 25/100 CR, 2 tablets at 8 AM/2 tablets at noon/1 tablet at 4 PM. Continue carbidopa/levodopa 50/200 CR at bedtime.  Depression -On Trintellix.  Managed by primary care.  Declines psychiatry.  I still think that this is a big issue in patient's life. B12 deficiency -On supplementation; Lumbar spinal stenosis -Following with Dr. Vertell Limber.  Phone number was given to patient to contact for steroid injections;  Insomnia -Continue melatonin to 3 mg.   Hospital visits:  08/05/2021 - ED Visit at [facility] due to Leg pain.  New?Medications Started at [facility] Discharge:?? predniSONE (DELTASONE) 20 MG tablet  Daily      Lidocaine 4% patch every 12 hours prn Medication Changes at Hospital Discharge: None noted Medications Discontinued at Hospital Discharge: None noted   Objective:  Lab Results  Component Value Date   CREATININE 0.98 09/27/2021   CREATININE 0.93 03/25/2021   CREATININE 1.12 11/18/2020    Lab Results  Component Value Date   HGBA1C 5.7 (H) 12/19/2018   Last diabetic Eye exam: No results found for: HMDIABEYEEXA  Last diabetic Foot exam: No results found for: HMDIABFOOTEX      Component Value Date/Time   CHOL 160 09/27/2021 1440   TRIG 120 09/27/2021 1440   TRIG 107 05/09/2010 0000   HDL 42 09/27/2021 1440   CHOLHDL 3.8 09/27/2021 1440   CHOLHDL 3.6 05/20/2015 1410   VLDL 27 05/20/2015 1410   LDLCALC 96 09/27/2021 1440    Hepatic Function  Latest Ref Rng & Units 09/27/2021 03/25/2021 08/11/2020  Total Protein 6.0 - 8.5 g/dL 6.7 6.8 6.9  Albumin 3.7 - 4.7 g/dL 4.4 4.2 4.1  AST 0 - 40 IU/L '20 25 23  ' ALT 0 - 44 IU/L '18 20 31  ' Alk Phosphatase 44 - 121 IU/L 72 71 69  Total Bilirubin 0.0 - 1.2 mg/dL 0.4 0.3 0.5  Bilirubin, Direct 0.00 - 0.40 mg/dL - - -    Lab Results  Component Value Date/Time   TSH 1.73 01/22/2020 03:28 PM   TSH 2.010 12/19/2018 11:23 AM    CBC Latest Ref Rng & Units 03/30/2020 03/30/2020 02/13/2020  WBC 4.0 - 10.5 K/uL - 9.6 13.5(H)  Hemoglobin 13.0 - 17.0 g/dL 15.6 15.6 15.9  Hematocrit 39.0 - 52.0 % 46.0 48.3 48.1  Platelets 150 - 400 K/uL - 189 245    Lab Results  Component Value Date/Time   VD25OH 28.37 (L) 07/28/2020 01:34 PM   VD25OH 19.92 (L) 01/22/2020 03:28 PM    Clinical ASCVD: Yes  The ASCVD Risk score (Arnett DK, et al., 2019) failed to calculate for the following reasons:   The patient has a prior MI or stroke diagnosis      Social History   Tobacco Use  Smoking Status Never   Passive exposure: Never  Smokeless Tobacco Never   BP Readings from Last 3 Encounters:  09/27/21 120/60  09/16/21 120/72  08/06/21 (!) 153/86   Pulse Readings from Last 3 Encounters:  09/27/21 92  08/06/21 85  03/25/21 73   Wt Readings from Last 3 Encounters:  09/27/21 205 lb (93 kg)  09/16/21 210 lb (95.3 kg)  04/08/21 210 lb (95.3 kg)    Assessment: Review of patient past medical history, allergies, medications, health status, including review of consultants reports, laboratory and other test data, was performed as part of comprehensive evaluation and provision of chronic care management services.   SDOH:  (Social Determinants of Health) assessments and interventions performed:    CCM Care Plan  Allergies  Allergen Reactions   Testosterone Other (See Comments)    ABDOMINAL PAIN and cramping   Fluoxetine Other (See Comments)    Caused depression and aggression   Requip [Ropinirole]  Nausea Only   Tizanidine Hcl Hives    Medications Reviewed Today     Reviewed by Francis Gaines, LCSW (Social Worker) on 09/30/21 at 1532  Med List Status: <None>   Medication Order Taking? Sig Documenting Provider Last Dose Status Informant  aspirin 81 MG EC tablet [id number] No Take 1 tablet (81 mg total) by mouth daily. [name], DO Taking Active   carbidopa-levodopa (SINEMET CR) 50-200 MG tablet [id number] No Take 1 tablet by mouth at bedtime. Ludwig Clarks, DO Taking Active   Carbidopa-Levodopa ER (SINEMET CR) 25-100 MG tablet controlled release [id number] No 2 at 7am, 2 at 11am, 1 at 4pm [name], Eustace Quail, DO Taking Active   Cholecalciferol (D3-1000) 25 MCG (1000 UT) capsule [id number] No Take 1,000 Units by mouth daily.  Patient not taking: Reported on 09/27/2021   [provider] Not Taking Active   diclofenac Sodium (VOLTAREN) 1 % GEL [id number] No Apply 2 g topically 4 (four) times daily.  Patient not taking: Reported on 09/27/2021   [name], DO Not Taking Active   esomeprazole (NEXIUM) 40 MG capsule [id number] No Take 1 capsule (40 mg total) by mouth at bedtime. [name], DO Taking Active   FLOVENT HFA 110 MCG/ACT inhaler [id number] No Inhale 1 puff into the lungs daily as needed (wheezing, shortness of breath). [name], DO Taking Active   fluocinonide-emollient (LIDEX-E) 0.05 % cream [id number] No Apply 1 application topically 2 (two) times daily.  Patient taking differently: Apply 1 application topically 2 (two) times daily as needed (to affected areas).   Raylene Everts, MD Taking Active   furosemide (LASIX) 40 MG tablet [id number] No Take 1 tablet (40 mg total) by mouth 3 (three) times a week. Richardo Priest, MD Taking Active   gabapentin (NEURONTIN) 100 MG capsule [id number] No Take 1 capsule (100 mg total) by mouth at bedtime. [name], DO Taking Active   Lidocaine (HM LIDOCAINE PATCH) 4 % PTCH  [id number] No Apply 1 patch topically every 12 (twelve) hours as needed. Loni Beckwith, PA-C Taking Active   metoprolol tartrate (LOPRESSOR) 25 MG tablet [id number] No Take 1 tablet (25 mg total) by mouth daily. Richardo Priest, MD Taking Active   Multiple Vitamin (MULTI-VITAMIN) tablet [id number] No Take 1 tablet by mouth daily. [provider] Taking Active   nitroGLYCERIN (NITROSTAT) 0.4 MG SL tablet [id number] No Place 1 tablet (0.4 mg total) under the tongue every 5 (five) minutes x 3 doses as needed for chest pain. [name], DO Taking Active   pravastatin (PRAVACHOL) 20 MG tablet [id number] No Take 1 tablet (20  mg total) by mouth daily. [name], DO Taking Active   prednisoLONE acetate (PRED FORTE) 1 % ophthalmic suspension [id number] No Place 1 drop into both eyes 2 (two) times daily. [provider] Taking Active Self  sacubitril-valsartan (ENTRESTO) 24-26 MG [id number] No Take 1 tablet by mouth daily. Richardo Priest, MD Taking Active   tiZANidine (ZANAFLEX) 4 MG tablet [id number] No Take 1 tablet (4 mg total) by mouth every 6 (six) hours as needed for muscle spasms. [name], DO Taking Active   vortioxetine HBr (TRINTELLIX) 5 MG TABS tablet [id number] No Take 1 tablet (5 mg total) by mouth daily. 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   Diarrhea 11/04/2020   Rectal bleeding 11/04/2020   Trochanteric bursitis of right hip    Transient ischemic attack    NSTEMI (non-ST elevated myocardial infarction) ([place])    Nephrolithiasis    Morbid obesity ([place])    Metabolic syndrome    Major depression, chronic    Long-term use of aspirin therapy    Hypercholesterolemia    Hiatal hernia    GERD (gastroesophageal reflux disease)    Essential tremor    Essential hypertension    Erectile dysfunction    Elevated PSA    Colon cancer  ([place])    Chronic coronary artery disease    Arthritis    Myofascial pain 12/20/2019   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   Low back pain 10/28/2019   Radiculopathy, lumbar region 10/28/2019   Fatigue 48/88/9169   Chronic systolic (congestive) heart failure ([place]) 09/18/2018   Ischemic cardiomyopathy 09/16/2018   Aortic regurgitation 09/16/2018   Cardiomyopathy, unspecified ([place]) 06/13/2018   Abscess of right axilla 12/12/2017   BMI 33.0-33.9,adult 12/12/2017   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) 07/06/2015   Medial meniscus tear 10/11/2011   Neuroma of foot 10/11/2011   Coronary artery disease involving native coronary artery of native heart with angina pectoris ([place]) 12/28/2010   OTHER TESTICULAR HYPOFUNCTION 05/20/2010   Mixed hyperlipidemia 05/20/2010   Hypertensive heart disease with heart failure ([place]) 05/20/2010   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 05/20/2010   GERD 05/20/2010   TRANSIENT ISCHEMIC ATTACK, HX OF 05/20/2010   NEPHROLITHIASIS, HX OF 05/20/2010   BENIGN PROSTATIC HYPERTROPHY, HX OF, S/P TURP 05/20/2010    Immunization History  Administered Date(s) Administered   Fluad Quad(high Dose 65+) 05/06/2019   Influenza Split 04/18/2012   Influenza Whole 05/20/2010   Influenza,inj,Quad PF,6+ Mos 06/13/2013, 05/20/2015   Influenza-Unspecified 05/31/2015   PFIZER(Purple Top)SARS-COV-2 Vaccination 09/27/2019, 10/22/2019   Pneumococcal Conjugate-13 05/20/2015   Pneumococcal Polysaccharide-23 05/20/2010   Td 08/02/2007    Conditions  to be addressed/monitored: CHF, CAD, HTN, HLD, Depression, and Parkinson's disease; chronic back pain  Care Plan : General Pharmacy (Adult)  Updates made by [name], RPH-CPP since 10/04/2021 12:00 AM     Problem: Chronic Conditions: Parkinson's Disease; HTN; GERD; CAD; chronic pain;  Long-Range Goal: Provide education, support and care coordination for medication therapy and chronic conditions   Start Date: 10/04/2021  Priority: High  Note:   BP Readings from Last 3 Encounters:  09/27/21 120/60  09/16/21 120/72  08/06/21 (!) 153/86        Medication Assistance: None required.  Patient affirms current coverage meets needs.  Patient's preferred pharmacy is:  [facility] - [place], [place] - [address] [place] [address] Phone: [phone] Fax: [phone]  Uses pill box? No - uses adherence packaging Pt endorses 50% compliance  Follow Up:  Patient agrees to Care Plan and Follow-up.  Plan: Telephone follow up appointment with care management team member scheduled for:  10/22/2021  [name], PharmD Clinical Pharmacist [facility]

## 2021-10-04 NOTE — Patient Instructions (Addendum)
? ?Mr. Finelli.  ?Below are your upcoming appointments at [facility] facilities ? ?Friday, March 10th at 3:15pm - Dr Wells Guiles [name] at [facility] in [place] ? ?Monday, March 13th at 12:45 PM  - Telephone Call / Visit with Social Worker, Pieter Partridge with [place] at [facility] ? ?Thursday, March 16th at 1:45 PM  - Telephone Call / Visit with Care Manager, Thea Silversmith with [place] at [facility] ? ?Friday, March 24th at 11:00 AM - Call / Visit with Clinical Pharmacist, [name] with [place] at [facility] ? ? ?It was a pleasure speaking with you today.  ?I have attached a summary of our visit today and information about your health goals. (See below for full care plan)  ? ?Patient Goals/Self-Care Activities ?Focus on medication adherence by filling medications appropriately and using adherence packaging.  ?Take medications as prescribed - EVERY day.  ?Report any questions or concerns to PharmD and/or provider(s). Is it important for your medical team to be aware if you suspect you are having side effects or taking medications differently.  ?Weigh daily, and contact provider if weight gain of of more than 3 lbs in 24 hours or 5 lbs in 1 week ?Start physical therapy - Dr [name] to send in order.  ? ?Our next appointment is by telephone on October 22, 2021 at 11:15am ? ?Please call the care guide team at [phone] if you need to cancel or reschedule your appointment.  ? ? ?If you have any questions or concerns, please feel free to contact me either at the phone number below or with a MyChart message.  ? ?Keep up the good work! ? ?[name], PharmD ?Clinical Pharmacist ?[facility] ?[place] ?[phone] (direct line)  ?[phone] (main office number) ? ?  ?Chronic Care Management - Pharmacy Care Plan ? ?Parkinson's Disease:  ?Goal: decrease symptoms while minimizing medication  side effects ?Managed by Dr Wells Guiles [name]  ?Currently therapy:  ?Carbidopa - levodopa (Sinemet ER) 50/'200mg'$  - take 1 tablet at bedtime ?Carbidopa - levodopa (Sinemet ER) 25/'100mg'$  - take 2 tablets at 7am, 2 tablets at 11am and 1 tablet at 4pm ?Interventions:  ?Sent message to Dr [name] regarding low adherence and concerns of side effects. She will discuss further with patient at upcoming appointment ?Recommended patient NOT split extended release carbidopa - levodopa as this might effect absorption and cause quicker release and thus increase in side effects.  ?Recommended yearly skin checks with dermatology due to risk of melanoma with Parkinson's Diseases / treatment. Patient is agreeable to dermatology referral.  ?Also discussed physical therapy with Dr Carles Collet, she will send referral ? ? ?Hypertension / congestive heart failure: ?Goal: blood pressure < 140/90  ?Goal for congestive heart failure:  management of symptoms while preventing exacerbations / hospitalizations ?Current treatment: ?Furosemide '40mg'$  -  take 1 tablet 3 times per week ?Metoprolol tartrate '25mg'$  - take 1 tablet daily  ?Entresto 24/'26mg'$  - take 1 tablet daily   ?Interventions:  ?Signs and symptoms of congestive heart failure exacerbation - weight gain, shortness of breath, abdominal fullness, swelling in legs or abdomen, Fatigue and weakness, changes in ability to perform usual activities, persistent cough or wheezing with white or pink blood-tinged mucus, nausea and lack of appetite ?Recommended checking weight daily - report weight gain of more than 3 lbs in 24 hours or 5 lbs in 1 week. ?Continue current regimen for  hypertension and congestive heart failure ?Continue to follow up with Dr [name], cardiologist ? ?Hyperlipidemia / heart disease: ?LDL improved but not at goal of < 70; Triglycerides at goal of < 150 ? ?Lab Results  ?Component Value Date  ? [place] 96 09/27/2021  ? LDLCALC 125 (H) 03/25/2021  ? LDLCALC 125 (H) 11/18/2020  ? ?Lab Results   ?Component Value Date  ? TRIG 120 09/27/2021  ? TRIG 174 (H) 03/25/2021  ? TRIG 148 11/18/2020  ? ?Current treatment: ?Aspirin '81mg'$  daily  ?Pravastatin '20mg'$  daily  ?Interventions:  ?Make sure to take pravastatin and aspirin EVERY day.  ?Continue current regimen - if next LDL not at goal will consider additional therapy ? ?Depression ?Current treatment: ?Trintellix '5mg'$  once a day ?Connected with Chronic Care Management social worker for mental health support ?Patient has spoken with social worker at Dr Doristine Devoid office, [place] in past.  ?Interventions:  ?Reviewed importance of taking Trintellix every day.  ?Patient to have follow up with Chronic Care Management social worker 10/11/2021. He will discuss with Dr [name] about seeing social worker in her office if needed as well.  ? ?Medication management ?Pharmacist Clinical Goal(s): ?Over the next 90 days, patient will work with PharmD and providers to maintain optimal medication adherence ?Current pharmacy: [facility] ?Interventions ?Comprehensive medication review performed. ?Reviewed refill history and assessed adherence ?Coordinated with Dr [name] neurologist to make her aware that patient is not taking medications for Parkinson's Disease as prescribed due to concerns for side effects. Dr [name] to discuss further with patient at appointment 10/08/2021 ?Encouraged patient to take all medications as prescribed and to use his adherence packaging just as it is set up. He should communicate medication problems or suspected side effect to either the clinical pharmacist or a medical provider so that adjustments can be made.  ?Coordinating with PCP for refills on gabapentin. Patient reminded to monitor for drowsiness / sleepiness with gabapentin.  ? ?Patient Goals/Self-Care Activities ?Over the next 90 days, patient will:  ?Focus on medication adherence by filling medications appropriately and using adherence packaging.  ?Take medications as prescribed - EVERY day.  ?Report any  questions or concerns to PharmD and/or provider(s). Is it important for your medical team to be aware if you suspect you are having side effects or taking medications differently.  ?Weigh daily, and contact provider if weight gain of of more than 3 lbs in 24 hours or 5 lbs in 1 week ?Start physical therapy - Dr [name] to send in order.  ?Our office will work on referrals for dermatology and ophthalmologist.  ? ?The patient verbalized understanding of instructions, educational materials, and care plan provided today and agreed to receive a mailed copy of patient instructions, educational materials, and care plan.  ?

## 2021-10-06 DIAGNOSIS — W010XXA Fall on same level from slipping, tripping and stumbling without subsequent striking against object, initial encounter: Secondary | ICD-10-CM | POA: Diagnosis not present

## 2021-10-06 DIAGNOSIS — S60221A Contusion of right hand, initial encounter: Secondary | ICD-10-CM | POA: Diagnosis not present

## 2021-10-07 NOTE — Progress Notes (Signed)
? ? ?Assessment/Plan:  ? ?1.  Parkinsons Disease ? -I am not convinced that the levodopa is making him that sleepy, but it certainly could be.  We did discuss that fatigue is the #1 treatment resistant complaint of Parkinson's patients, independent of medications.  Patient also does state that all medications, including Tylenol and Advil make him sleepy. ? -Clinically, he looks much worse now that he has stopped his levodopa. ? -He was agreeable to trying Rytary, although I told him it would be very expensive for him.  Gave him samples of low-dose Rytary, 145 mg, and he will take that 3 times per day for now.  I told him he needed to call me in a few weeks and let me know how it is doing, long before the samples run out. ? ? ? ?2.  Depression ? -On Trintellix.  Managed by primary care.  Declines psychiatry.  I still think that this is a big issue in patient's life. ? -We talked about going to [facility] for counseling.  He was agreeable.  He thought he had an appointment with her on Monday, but it looks like he has an appointment for social work with primary care.  It does not matter where he goes, I just would like him to go.  The appointment with primary care looks like a phone visit out reach call as part of chronic care management, and not necessarily for counseling. ? ?3.  B12 deficiency ? -On supplementation. ? ?4.  Lumbar spinal stenosis ? -has followed with [place] [facility] in past ? -Has more recently seen Dr. Raeford Razor (sports med) ? -Requests referral to physical therapy in [place] for associated sciatica.  Referral sent today.  I am hopeful that they will work with Parkinson's therapy as well. ? ?5.  Insomnia ? -Continue melatonin to 3 mg. ?Subjective:  ? ?Shawn Meza was seen today in follow up for Parkinsons disease.  My previous records were reviewed prior to todays visit as well as outside records available to me.  No falls since last visit.  No lightheadedness or near syncope.  Received correspondence  from patient's Pharm.D. earlier this week.  During the recent chronic care management session, the patient reported poor compliance with levodopa.  He reported he was only taking daytime levodopa 1-2 times per day and took nighttime levodopa about 4 nights per week.  He admits today that he isn't really taking his meds at all and has stopped daytime levodopa altogether and does not want to go back to it.   He states that it made him too sleepy.  He states that even Tylenol and Advil will make him sleepy.  States that he is willing to try to split Advil into quarters.  He fell last week coming out of the vet office.  He was carrying his dog.  He went to put the dog down and the dog ran towards the history and it scared him and he ended up falling forward.  Onlookers were able to eventually get the dog, but the patient ended up fracturing his rib. ? ?Separate from the above, the patient is complaining about sciatica.  He has a history of lumbar spinal stenosis with similar issues in the past.  He apparently called his primary care/sports med about a month ago and he was seen by Dr. Raeford Razor and had an SI joint injection.  Patient reports that he did not think it was that helpful, but he is waiting for a referral to  physical therapy.  He asks me if I can put that in today. ? ?Current prescribed movement disorder medications: ?Carbidopa/levodopa 25/100 CR, 2 at 8 AM/2 at noon/1 at 4 PM  ?Carbidopa/levodopa 50/200 CR at bedtime (added last visit) ?B12 supplement ?Melatonin, 3 mg ? ?Prior medications: Carbidopa/levodopa 25/100 IR: Carbidopa/levodopa 25/100 CR: Carbidopa/levodopa 50/200 CR ? ? ?ALLERGIES:   ?Allergies  ?Allergen Reactions  ? Testosterone Other (See Comments)  ?  ABDOMINAL PAIN and cramping  ? Fluoxetine Other (See Comments)  ?  Caused depression and aggression  ? Requip [Ropinirole] Nausea Only  ? Tizanidine Hcl Hives  ? ? ?CURRENT MEDICATIONS:  ?Outpatient Encounter Medications as of 10/08/2021  ?Medication  Sig  ? aspirin 81 MG EC tablet Take 1 tablet (81 mg total) by mouth daily.  ? Carbidopa-Levodopa ER (SINEMET CR) 25-100 MG tablet controlled release 2 at 7am, 2 at 11am, 1 at 4pm  ? esomeprazole (NEXIUM) 40 MG capsule Take 1 capsule (40 mg total) by mouth at bedtime.  ? FLOVENT HFA 110 MCG/ACT inhaler Inhale 1 puff into the lungs daily as needed (wheezing, shortness of breath).  ? furosemide (LASIX) 40 MG tablet Take 1 tablet (40 mg total) by mouth 3 (three) times a week. (Patient taking differently: Take 20 mg by mouth 3 (three) times a week.)  ? metoprolol tartrate (LOPRESSOR) 25 MG tablet Take 1 tablet (25 mg total) by mouth daily.  ? nitroGLYCERIN (NITROSTAT) 0.4 MG SL tablet Place 1 tablet (0.4 mg total) under the tongue every 5 (five) minutes x 3 doses as needed for chest pain.  ? pravastatin (PRAVACHOL) 20 MG tablet Take 1 tablet (20 mg total) by mouth daily.  ? sacubitril-valsartan (ENTRESTO) 24-26 MG Take 1 tablet by mouth daily.  ? tiZANidine (ZANAFLEX) 4 MG tablet Take 1 tablet (4 mg total) by mouth every 6 (six) hours as needed for muscle spasms.  ? vortioxetine HBr (TRINTELLIX) 5 MG TABS tablet Take 1 tablet (5 mg total) by mouth daily.  ? carbidopa-levodopa (SINEMET CR) 50-200 MG tablet Take 1 tablet by mouth at bedtime. (Patient not taking: Reported on 10/08/2021)  ? diclofenac Sodium (VOLTAREN) 1 % GEL Apply 2 g topically 4 (four) times daily. (Patient not taking: Reported on 09/27/2021)  ? gabapentin (NEURONTIN) 100 MG capsule Take 1 capsule (100 mg total) by mouth at bedtime. (Patient not taking: Reported on 10/04/2021)  ? prednisoLONE acetate (PRED FORTE) 1 % ophthalmic suspension Place 1 drop into both eyes 2 (two) times daily. (Patient not taking: Reported on 10/04/2021)  ? ?No facility-administered encounter medications on file as of 10/08/2021.  ? ? ?Objective:  ? ?PHYSICAL EXAMINATION:   ? ?VITALS:   ?Vitals:  ? 10/08/21 1530  ?BP: 126/83  ?Pulse: 72  ?SpO2: 97%  ?Weight: 206 lb 9.6 oz (93.7 kg)   ?Height: '5\' 5"'$  (1.651 m)  ? ? ? ? ?GEN:  The patient appears stated age and is in NAD. ?HEENT:  Normocephalic, atraumatic.  The mucous membranes are moist.  ? ?Neurological examination: ? ?Orientation: The patient is alert and oriented x3. ?Cranial nerves: There is good facial symmetry with facial hypomimia. The speech is fluent and clear. Soft palate rises symmetrically and there is no tongue deviation. Hearing is intact to conversational tone. ?Sensation: Sensation is intact to light touch throughout ?Motor: Strength is at least antigravity x4. ? ? ? ?Movement examination: ?Tone: There is nl increased tone in the LUE/LLE ?Abnormal movements: there is LUE/LLE rest tremor ?Coordination:  There is decremation, with  any form of RAMS, including alternating supination and pronation of the forearm, hand opening and closing, finger taps, heel taps and toe taps on the L ?Gait and Station: The patient has mild difficulty arising out of a deep-seated chair without the use of the hands. The patient's stride length is markedly decreased.  He is dragging the L leg   ? ?I have reviewed and interpreted the following labs independently ? ?  Chemistry   ?   ?Component Value Date/Time  ? NA 142 09/27/2021 1440  ? K 4.4 09/27/2021 1440  ? CL 106 09/27/2021 1440  ? CO2 21 09/27/2021 1440  ? BUN 17 09/27/2021 1440  ? CREATININE 0.98 09/27/2021 1440  ? CREATININE 1.03 05/20/2015 1410  ?    ?Component Value Date/Time  ? CALCIUM 9.2 09/27/2021 1440  ? ALKPHOS 72 09/27/2021 1440  ? AST 20 09/27/2021 1440  ? ALT 18 09/27/2021 1440  ? BILITOT 0.4 09/27/2021 1440  ?  ? ? ? ?Lab Results  ?Component Value Date  ? WBC 9.6 03/30/2020  ? HGB 15.6 03/30/2020  ? HCT 46.0 03/30/2020  ? MCV 98.4 03/30/2020  ? PLT 189 03/30/2020  ? ? ?Lab Results  ?Component Value Date  ? TSH 1.73 01/22/2020  ? ? ? ?Total time spent on today's visit was 42 minutes, including both face-to-face time and nonface-to-face time.  Time included that spent on review of  records (prior notes available to me/labs/imaging if pertinent), discussing treatment and goals, answering patient's questions and coordinating care. ? ?Cc:  [name], DO ? ?

## 2021-10-08 ENCOUNTER — Ambulatory Visit (INDEPENDENT_AMBULATORY_CARE_PROVIDER_SITE_OTHER): Payer: Medicare Other | Admitting: Neurology

## 2021-10-08 ENCOUNTER — Encounter: Payer: Self-pay | Admitting: Neurology

## 2021-10-08 ENCOUNTER — Other Ambulatory Visit: Payer: Self-pay

## 2021-10-08 VITALS — BP 126/83 | HR 72 | Ht 65.0 in | Wt 206.6 lb

## 2021-10-08 DIAGNOSIS — G20 Parkinson's disease: Secondary | ICD-10-CM | POA: Diagnosis not present

## 2021-10-08 DIAGNOSIS — G20A1 Parkinson's disease without dyskinesia, without mention of fluctuations: Secondary | ICD-10-CM

## 2021-10-08 DIAGNOSIS — F330 Major depressive disorder, recurrent, mild: Secondary | ICD-10-CM | POA: Diagnosis not present

## 2021-10-08 DIAGNOSIS — M5430 Sciatica, unspecified side: Secondary | ICD-10-CM

## 2021-10-08 MED ORDER — RYTARY 36.25-145 MG PO CPCR: ORAL_CAPSULE | ORAL | 1 refills | Status: DC

## 2021-10-08 NOTE — Patient Instructions (Addendum)
Local and Online Resources for Power over Parkinson's Group March 2023  LOCAL [place] PARKINSON'S GROUPS  Power over Parkinson's Group :   Power Over Parkinson's Patient Education Group will be Wednesday, March 8th-*Hybrid meting*- in person at [place] location and via [facility] at 2:00 pm.   Upcoming Power over Parkinson's Meetings:  2nd Wednesdays of the month at 2 pm:  March 8th, April 12th Contact Amy Marriott at [email or web address] if interested in participating in this group Parkinson's Care Partners Group:    3rd Mondays, Contact Misty Paladino Atypical Parkinsonian Patient Group:   4th Wednesdays, [place] If you are interested in participating in these groups with Misty, please contact her directly for how to join those meetings.  Her contact information is [email or web address].    LOCAL EVENTS AND NEW OFFERINGS Parkinson's Wellness Event:  Pottery Night at [facility] [place].  Friday, March 10th 5:30-7:30 pm.  Sponsored by [employer].  FREE event for people with Parkinson's and care partners.  RSVP to [facility] at [email or web address] Dine out at [facility].  Celebrate Parkinson's disease Awareness Month and Support the Parkinson's Movement Disorder Fund.   Wednesday, April 19th 4-6 pm at [place], [employer].  (Give receipt to cashier and 20% will be donated) Parkinson's T-shirts for sale!  Designed by a local group member, with funds going to [place].  $20.00  [place] to purchase (see email above) New PWR! Moves Class offering at [employer]!  Fridays 1-2 pm in March (likely to switch days and times after March).  Come try it out and see if PWR! Moves is a good fit for your exercise routine!  Contact Amy Marriott for details:  [email or web address] Hamil-[place] Challenge Bike, Run, Walk for PD, PSP, MSA.    Saturday, April 8th at 8 am.  [place]   Proceeds go to offset costs of exercise programs locally.  To Register, visit website:  [email or web address]  [place]:  www.[email or web address] PD Health at Home continues:  Mindfulness Mondays, Expert Briefing Tuesdays, Wellness Wednesdays, Take Time Thursdays, Fitness Fridays  Upcoming Education:  Parkinson's and Medications:  [employer] Group.  Wednesday, March 8th at 1:00 pm. Freezing and Fall Prevention in Parkinson's.  Wednesday, April 12th at 1:00 pm Register for expert briefings Cytogeneticist) at WatchCalls.si [place] Chapter Parkinson's Symposium: Cognition Changes- a free in-person ([place], [place]) and online [employer]) event for people with Parkinson's and their loved ones. During this event we will explore new treatments and practical strategies for addressing these changes.  Saturday, April 1st, 10 am-1 pm.  Register at [email or web address]/ or contact [place] at [phone] or [email or web address].  Please check out their website to sign up for emails and see their full online offerings  [place]:  [email or web address]  Third Thursday Webinars:  On the third Thursday of every month at 12 p.m. ET, join our free live webinars to learn about various aspects of living with Parkinson's disease and our work to speed medical breakthroughs. Upcoming Webinar:  The Power of Women in Philanthropy.  Tues, March 28th at  12 pm. Check out additional information on their website to see their full online offerings  [employer]:  [email or web address] Upcoming Webinar:   Stay tuned Webinar Series:  Living with Parkinson's Meetup.   Third Thursdays of each month at 3 pm Care Partner Monthly Meetup.  With Robin Searing Phinney.  First Tuesday of each month, 2 pm Check out additional  information to Live Well Today on their  website  Parkinson and Movement Disorders (PMD) Alliance:  www.pmdalliance.org [employer] Online:  Online Education Events Sign up for emails, which are sent weekly to give you updates on programming and online offerings  Parkinson's Association of the [place]:  www.[email or web address] Information on online support groups, education events, and online exercises including Yoga, Parkinson's exercises and more-LOTS of information on links to PD resources and online events Virtual Support Group through Parkinson's Association of the [place]; next one is scheduled for Wednesday, *April 5th at 2 pm. *March meeting has been cancelled. (These are typically scheduled for the 1st Wednesday of the month at 2 pm).  Visit website for details. MOVEMENT AND EXERCISE OPPORTUNITIES Parkinson's DRUMMING Classes/Music Therapy with Doylene Canning:  This is a returning class and it's FREE!  2nd Mondays, continuing March 13th , 11:00 at the [place].  Contact *Misty Taylor-Paladino at [email or web address] or Doylene Canning at [phone] or [email or web address]  PWR! Moves Classes at [place].  Wednesdays 10 and 11 am.   NEW PWR! Moves Class offering at [employer].  Fridays 1-2 pm.  Contact Amy Marriott, PT [email or web address] if interested. Here is a link to the PWR!Moves classes on Zoom from [place] - Daily Mon-Sat at 10:00. Via Zoom, FREE and open to all.  There is also a link below via Facebook if you use that platform. [email or web address].si [email or web address]  Parkinson's Wellness Recovery (PWR! Moves)  www.[email or web address] Info on the PWR! Virtual Experience:  You will have access to our expertise through  self-assessment, guided plans that start with the PD-specific fundamentals, educational content, tips, Q&A with an expert, and a growing Art therapist of PD-specific pre-recorded and live exercise classes of varying types and intensity - both physical and cognitive! If that is not enough, we offer 1:1 wellness consultations (in-person or virtual) to personalize your PWR! Research scientist (medical).  [place] Fridays:  As part of the PD Health @ Home program, this free video series focuses each week on one aspect of fitness designed to support people living with Parkinson's.  These weekly videos highlight the [place] recent fitness guidelines for people with Parkinson's disease. ModemGamers.si Dance for PD website is offering free, live-stream classes throughout the week, as well as links to [employer] of classes:  [email or web address]/ Dance for Parkinson's in-person class.  February 1-April 26, Wednesdays 4-5 pm.  Free class for people with Parkinson's disease, at [address], [place], [place].  Contact [phone] or [email or web address] to register Virtual dance and Pilates for Parkinson's classes: Click on the Community Tab> Parkinson's Movement Initiative Tab.  To register for classes and for more information, visit www.[email or web address] and click the community tab.  [employer] Parkinson's Cycling Classes  [employer]:  Thursdays @ Noon-Live classes at [employer] ([employer] at [email or web address] or [phone]) [facility]: Virtual Classes Mondays and Thursdays Jeanette Caprice classes Tuesday, Wednesday and Thursday (contact [place] at [email or web address]  or [phone]) [place] Varied levels of classes are offered Mondays, Tuesdays and Thursdays at [employer].  Stretching with Verdis Frederickson weekly class is also offered for people with  Parkinson's To observe a class or for more information, call [phone] or email Hezzie Bump at [email or web address] ADDITIONAL SUPPORT AND RESOURCES Well-Spring [employer]:Online Caregiver Education Opportunities:  www.well-springsolutions.org/caregiver-education/caregiver-support-group.  You may also contact Vickki Muff at [email or web address] or [phone].    Well-Spring Navigator:  [phone] program, a free service to help individuals and families through the  journey of determining care for older adults.  The Navigator is a Education officer, museum, Arnell Asal, who will speak with a prospective client and/or loved ones to provide an assessment of the situation and a set of recommendations for a personalized care plan -- all free of charge, and whether Well-Spring [employer] offers the needed service or not. If the need is not a service we provide, we are well-connected with reputable programs in town that we can refer you to.  www.well-springsolutions.org or to speak with the Navigator, call [phone]

## 2021-10-11 ENCOUNTER — Telehealth: Payer: Self-pay

## 2021-10-11 ENCOUNTER — Ambulatory Visit: Payer: Medicare Other | Admitting: *Deleted

## 2021-10-11 DIAGNOSIS — M5416 Radiculopathy, lumbar region: Secondary | ICD-10-CM

## 2021-10-11 DIAGNOSIS — E782 Mixed hyperlipidemia: Secondary | ICD-10-CM

## 2021-10-11 DIAGNOSIS — I255 Ischemic cardiomyopathy: Secondary | ICD-10-CM

## 2021-10-11 DIAGNOSIS — Z87442 Personal history of urinary calculi: Secondary | ICD-10-CM

## 2021-10-11 DIAGNOSIS — E785 Hyperlipidemia, unspecified: Secondary | ICD-10-CM

## 2021-10-11 DIAGNOSIS — F339 Major depressive disorder, recurrent, unspecified: Secondary | ICD-10-CM

## 2021-10-11 NOTE — Telephone Encounter (Signed)
He reports the new medication that Dr. Carles Collet gave him Friday has made his stomach "hurt" and asked is that normal" .   ?

## 2021-10-11 NOTE — Patient Instructions (Signed)
Visit Information ? ?Thank you for taking time to visit with me today. Please don't hesitate to contact me if I can be of assistance to you before our next scheduled telephone appointment. ? ?Following are the goals we discussed today:  ?Patient Goals/Self-Care Activities:  ?Continue to incorporate into daily practice - relaxation techniques, deep breathing exercises, and mindfulness meditation strategies. ?Review Statement in Support of Claim, Physician Statement for Aid and Attendance, How to Apply for Aid and Attendance Benefits, Authorization to [employer], and Aid and Attendance Benefits Application, and be prepared to complete with LCSW during next scheduled telephone outreach call. ? ~ Not eligible for Aid and Attendance Benefits through [employer]. ?Collaboration with Althea Charon, LCSW with [facility] [phone]), to confirm follow-up appointment for psychotherapeutic services. ?~ Confirmed follow-up appointment with Althea Charon, LCSW with [facility], scheduled on 10/12/2021 at 3:00 pm, for ongoing mental health counseling and supportive services. ?Collaboration with Primary Care Provider, Dr. Audelia Acton to obtain order for reclining lift chair, per your request. ?[employer] [phone]), if you decide to install handrails in your home. ?Contact LCSW directly (# [phone]), if you have questions, need assistance, or if additional social work needs are identified in the near future.   ?No Follow-Up Required. ? ?Please call the care guide team at [phone] if you need to cancel or reschedule your appointment.  ? ?If you are experiencing a Mental Health or [place] or need someone to talk to, please call the Suicide and Crisis Lifeline: 988 ?call the [place] National Suicide Prevention Lifeline: 661-324-6604 or TTY: 254 732 3686 TTY 507-874-6458) to talk to a trained counselor ?call  1-800-273-TALK (toll free, 24 hour hotline) ?go to [facility] [address], [place] [id number]) ?call the [facility]: [phone] ?call 911  ? ?Patient verbalizes understanding of instructions and care plan provided today and agrees to view in [place]. Active MyChart status confirmed with patient.   ? ?Nat Christen LCSW ?Licensed Clinical Social Worker ?[place] ?[phone]  ?

## 2021-10-11 NOTE — Chronic Care Management (AMB) (Cosign Needed)
Chronic Care Management    Clinical Social Work Note  10/11/2021 Name: Shawn Meza  Shawn Meza is a 76 y.o. year old male who is a primary care patient of [name], DO. The CCM team was consulted to assist the patient with chronic disease management and/or care coordination needs related to: [employer] and [place] [employer].   Engaged with patient by telephone for follow up visit in response to provider referral for social work chronic care management and care coordination services.   Consent to Services:  The patient was given information about Chronic Care Management services, agreed to services, and gave verbal consent prior to initiation of services.  Please see initial visit note for detailed documentation.   Patient agreed to services and consent obtained.   Assessment: Review of patient past medical history, allergies, medications, and health status, including review of relevant consultants reports was performed today as part of a comprehensive evaluation and provision of chronic care management and care coordination services.     SDOH (Social Determinants of Health) assessments and interventions performed:    Advanced Directives Status: Not addressed in this encounter.  CCM Care Plan  Allergies  Allergen Reactions   Rosuvastatin Other (See Comments)    Whole body aches   Testosterone Other (See Comments)    ABDOMINAL PAIN and cramping   Fluoxetine Other (See Comments)    Caused depression and aggression   Requip [Ropinirole] Nausea Only   Tizanidine Hcl Hives    Outpatient Encounter Medications as of 10/11/2021  Medication Sig   aspirin 81 MG EC tablet Take 1 tablet (81 mg total) by mouth daily.   Carbidopa-Levodopa ER (RYTARY) 36.25-145 MG CPCR Samples of this drug were given to the patient, quantity 2, Lot Number [id number] A Exp 08/23   diclofenac Sodium (VOLTAREN) 1 % GEL Apply 2 g  topically 4 (four) times daily. (Patient not taking: Reported on 09/27/2021)   esomeprazole (NEXIUM) 40 MG capsule Take 1 capsule (40 mg total) by mouth at bedtime.   FLOVENT HFA 110 MCG/ACT inhaler Inhale 1 puff into the lungs daily as needed (wheezing, shortness of breath).   furosemide (LASIX) 40 MG tablet Take 1 tablet (40 mg total) by mouth 3 (three) times a week. (Patient taking differently: Take 20 mg by mouth 3 (three) times a week.)   gabapentin (NEURONTIN) 100 MG capsule Take 1 capsule (100 mg total) by mouth at bedtime. (Patient not taking: Reported on 10/04/2021)   metoprolol tartrate (LOPRESSOR) 25 MG tablet Take 1 tablet (25 mg total) by mouth daily.   nitroGLYCERIN (NITROSTAT) 0.4 MG SL tablet Place 1 tablet (0.4 mg total) under the tongue every 5 (five) minutes x 3 doses as needed for chest pain.   pravastatin (PRAVACHOL) 20 MG tablet Take 1 tablet (20 mg total) by mouth daily.   prednisoLONE acetate (PRED FORTE) 1 % ophthalmic suspension Place 1 drop into both eyes 2 (two) times daily. (Patient not taking: Reported on 10/04/2021)   sacubitril-valsartan (ENTRESTO) 24-26 MG Take 1 tablet by mouth daily.   tiZANidine (ZANAFLEX) 4 MG tablet Take 1 tablet (4 mg total) by mouth every 6 (six) hours as needed for muscle spasms.   vortioxetine HBr (TRINTELLIX) 5 MG TABS tablet Take 1 tablet (5 mg total) by mouth daily.   No facility-administered encounter medications on file as of 10/11/2021.    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   Diarrhea 11/04/2020   Rectal bleeding 11/04/2020   Trochanteric bursitis of right hip    Transient ischemic attack    NSTEMI (non-ST elevated myocardial infarction) ([place])    Nephrolithiasis    Morbid obesity ([place])    Metabolic syndrome    Major depression, chronic    Long-term use of aspirin therapy    Hypercholesterolemia    Hiatal hernia    GERD (gastroesophageal reflux disease)    Essential tremor     Essential hypertension    Erectile dysfunction    Elevated PSA    Colon cancer ([place])    Chronic coronary artery disease    Arthritis    Myofascial pain 12/20/2019   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   Low back pain 10/28/2019   Radiculopathy, lumbar region 10/28/2019   Fatigue 46/96/2952   Chronic systolic (congestive) heart failure ([place]) 09/18/2018   Ischemic cardiomyopathy 09/16/2018   Aortic regurgitation 09/16/2018   Cardiomyopathy, unspecified ([place]) 06/13/2018   Abscess of right axilla 12/12/2017   BMI 33.0-33.9,adult 12/12/2017   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   Acute blood loss anemia 10/25/2017   Acute postoperative respiratory insufficiency 10/25/2017   Postoperative delirium 10/25/2017   Dyslipidemia 10/17/2017   SOB (shortness of breath) 03/31/2017   Long term current use of aspirin 03/29/2017   Parkinson's disease ([place]) 01/02/2017   Morbid (severe) obesity due to excess calories ([place]) 10/27/2015   Memory change 07/06/2015   Depression, recurrent ([place]) 07/06/2015   Medial meniscus tear 10/11/2011   Neuroma of foot 10/11/2011   Coronary artery disease involving native coronary artery of native heart with angina pectoris ([place]) 12/28/2010   OTHER TESTICULAR HYPOFUNCTION 05/20/2010   Mixed hyperlipidemia 05/20/2010   Hypertensive heart disease with heart failure ([place]) 05/20/2010   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 05/20/2010   GERD 05/20/2010   TRANSIENT ISCHEMIC ATTACK, HX OF 05/20/2010   NEPHROLITHIASIS, HX OF 05/20/2010   BENIGN PROSTATIC HYPERTROPHY, HX OF, S/P TURP 05/20/2010    Conditions to be addressed/monitored:  Depression, Parkinson's Disease, and Hypertension.  Limited Social Support, Mental Health Concerns, Family and Relationship Dysfunction, Social Isolation, and Lacks Knowledge of [employer].  Care Plan : LCSW Plan of Care  Updates made by Francis Gaines, LCSW since 10/11/2021 12:00 AM      Problem: Reduce and Manage My Symptoms of Anxiety and Depression. Resolved 10/11/2021  Priority: High     Goal: Reduce and Manage My Symptoms of Anxiety and Depression. Completed 10/11/2021  Start Date: 09/29/2021  Expected End Date: 10/11/2021  This Visit's Progress: On track  Recent Progress: On track  Priority: High  Note:   Current Barriers:   Patient with Acute Mental Health Needs related to Parkinson's Disease, Anxiety, and Depression, requires Support, Education, Resources, Referrals, Advocacy, and Care Coordination, in order to meet Unmet Acute Mental Health Needs. Patient lacks knowledge of available community counseling agencies and resources. Clinical Goal(s):  Patient will work with LCSW, to reduce and manage symptoms of Anxiety and Depression, until well-established with a community mental health provider.   Patient will increase knowledge and/or ability of:        Coping Skills, Healthy Habits, Self-Management Skills, Stress Reduction, Home Safety, and Utilizing [employer] and Resources.   Interventions:  Collaboration with Primary Care Provider, Dr. Audelia Acton regarding development and update of comprehensive plan of care, as evidenced by provider attestation and co-signature. Inter-disciplinary care team collaboration (see longitudinal plan of care). Clinical Interventions:  Mindfulness Meditation Strategies, Relaxation Techniques, and Deep Breathing Exercises Reviewed, and Encouraged Daily. Active Listening/Reflection Utilized. Verbalization of Feelings Encouraged, and Emotional Support Provided. Problem Solving/Task-Centered Solutions Developed. Client-Centered Therapy Performed.     Patient Goals/Self-Care Activities:  Continue to incorporate into daily practice - relaxation techniques, deep breathing exercises, and mindfulness meditation strategies. Review Statement in Support of Claim, Physician Statement for Aid and Attendance, How to Apply for Aid and  Attendance Benefits, Authorization to [employer], and Aid and Attendance Benefits Application, and be prepared to complete with LCSW during next scheduled telephone outreach call.  ~ Not eligible for Aid and Attendance Benefits through [employer]. Collaboration with Althea Charon, LCSW with [facility] [phone]), to confirm follow-up appointment for psychotherapeutic services. ~ Confirmed follow-up appointment with Althea Charon, LCSW with [facility], scheduled on 10/12/2021 at 3:00 pm, for ongoing mental health counseling and supportive services. Collaboration with Primary Care Provider, Dr. Audelia Acton to obtain order for reclining lift chair, per your request. [employer] [phone]), if you decide to install handrails in your home. Contact LCSW directly (# [phone]), if you have questions, need assistance, or if additional social work needs are identified in the near future.   No Follow-Up Required.    L[place] Licensed Clinical Social Worker [facility] [phone]

## 2021-10-11 NOTE — Telephone Encounter (Signed)
Called patient back unable to reach left a voicemail detailing that he needs to stay away from Protein and take medication with a carb like applesauce or toast In the morning  ?

## 2021-10-12 ENCOUNTER — Ambulatory Visit: Payer: Medicare Other | Admitting: Licensed Clinical Social Worker

## 2021-10-12 ENCOUNTER — Other Ambulatory Visit: Payer: Self-pay

## 2021-10-12 ENCOUNTER — Telehealth: Payer: Self-pay | Admitting: Neurology

## 2021-10-12 DIAGNOSIS — F330 Major depressive disorder, recurrent, mild: Secondary | ICD-10-CM

## 2021-10-12 NOTE — BH Specialist Note (Signed)
Integrated Behavioral Health Initial In-Person Visit ? ?MRN: [id number] ?Name: Shawn Meza ? ?Number of [place] Clinician visits: No data recorded ?Session Start time: 1500 ?   ?Session End time: 6283 ? ?Total time in minutes: 50 ? ? ?Types of Service: Individual psychotherapy ? ?Interpretor:No. Interpretor Name and Language: NA ? ? ? Warm Hand Off Completed. ?  ? ?  ? ? ?Subjective: ?Shawn Meza is a 76 y.o. male accompanied by  Self ?Patient was referred by Dr. Wells Guiles [name] for Depression. ?Patient reports the following symptoms/concerns: Little interest, depressed mood, lack of interest ?Duration of problem: Several months; Severity of problem: moderate ? ?Objective: ?Mood: Depressed and Affect: Depressed ?Risk of harm to self or others: No plan to harm self or others ? ?Life Context: ?Family and Social: Pt resides by himself and is divorced, was trying to get back together with exwife  ?School/Work: Pt is retired  ?Self-Care: Pt does need some assistance with ADL's at times .   ?Life Changes: Pt does report some chronic pain with back issues and does not like to take medication  ? ?Patient and/or Family's Strengths/Protective Factors: ?Concrete supports in place (healthy food, safe environments, etc.) ? ?Goals Addressed: ?Patient will: ?Reduce symptoms of: depression ?Increase knowledge and/or ability of: coping skills  ?Demonstrate ability to: Increase healthy adjustment to current life circumstances ? ?Progress towards Goals: ?Ongoing ? ?Interventions: ?Interventions utilized: Veterinary surgeon, CBT Cognitive Behavioral Therapy, and Link to [employer]  ?Standardized Assessments completed: Not Needed ? ?Patient and/or Family Response: Pt open to therapy interventions  ? ?Patient Centered Plan: ?Patient is on the following Treatment Plan(s):  replace depressive promoting thoughts with mood elevating thoughts and increase activities that provide please and help with Parkinson  as well as decrease isolation . ? ?Assessment: ?Patient currently experiencing depressive symptoms of sadness, loss of interest, loss of energy, ?  ?Patient may benefit from social interaction, exercise program for Parkinson's . ? ?Plan: ?Follow up with behavioral health clinician on : three to four weeks  ?Behavioral recommendations: Patient may benefit from social interaction, exercise program for Parkinson's . ?Referral(s): Community Resources:  RSB, Dance with Parkinson's Support Group and engagement with friends  ?"From scale of 1-10, how likely are you to follow plan?": 7 ? ?Shawn Ehrhard A Taylor-Paladino, LCSW ? ? ? ? ? ? ? ? ?

## 2021-10-12 NOTE — Telephone Encounter (Signed)
Patient has some questions about his medication.  Please call. ?

## 2021-10-13 NOTE — Telephone Encounter (Signed)
Called both numbers and left messages.

## 2021-10-13 NOTE — Telephone Encounter (Signed)
Called patient and Rehab needed new form adding on the [place] to referral form I called left voicemail with [facility] and filled out new form and resent it . Also patient asking about taking medication and what he can eat  ?

## 2021-10-14 ENCOUNTER — Ambulatory Visit: Payer: Medicare Other

## 2021-10-14 NOTE — Chronic Care Management (AMB) (Signed)
?Chronic Care Management  ? ?CCM RN Visit Note ? ?10/14/2021 ?Name: Shawn Meza MRN: [id number] DOB: April 03, 1946 ? ?Subjective: ?Shawn Meza is a 76 y.o. year old male who is a primary care patient of [name], DO. The care management team was consulted for assistance with disease management and care coordination needs.   ? ?Engaged with patient by telephone for follow up visit in response to provider referral for case management and/or care coordination services.  ? ?Consent to Services:  ?The patient was given information about Chronic Care Management services, agreed to services, and gave verbal consent prior to initiation of services.  Please see initial visit note for detailed documentation.  ? ?Patient agreed to services and verbal consent obtained.  ? ?Assessment: Review of patient past medical history, allergies, medications, health status, including review of consultants reports, laboratory and other test data, was performed as part of comprehensive evaluation and provision of chronic care management services.  ? ?SDOH (Social Determinants of Health) assessments and interventions performed:   ? ?CCM Care Plan ? ?Allergies  ?Allergen Reactions  ? Rosuvastatin Other (See Comments)  ?  Whole body aches  ? Testosterone Other (See Comments)  ?  ABDOMINAL PAIN and cramping  ? Fluoxetine Other (See Comments)  ?  Caused depression and aggression  ? Requip [Ropinirole] Nausea Only  ? Tizanidine Hcl Hives  ? ? ?Outpatient Encounter Medications as of 10/14/2021  ?Medication Sig Note  ? aspirin 81 MG EC tablet Take 1 tablet (81 mg total) by mouth daily.   ? Carbidopa-Levodopa ER (RYTARY) 36.25-145 MG CPCR Samples of this drug were given to the patient, quantity 2, Lot Number [id number] A Exp 08/23   ? esomeprazole (NEXIUM) 40 MG capsule Take 1 capsule (40 mg total) by mouth at bedtime.   ? FLOVENT HFA 110 MCG/ACT inhaler Inhale 1 puff into the lungs daily as needed (wheezing, shortness of breath).   ?  furosemide (LASIX) 40 MG tablet Take 1 tablet (40 mg total) by mouth 3 (three) times a week. (Patient taking differently: Take 20 mg by mouth 3 (three) times a week.)   ? metoprolol tartrate (LOPRESSOR) 25 MG tablet Take 1 tablet (25 mg total) by mouth daily.   ? nitroGLYCERIN (NITROSTAT) 0.4 MG SL tablet Place 1 tablet (0.4 mg total) under the tongue every 5 (five) minutes x 3 doses as needed for chest pain.   ? sacubitril-valsartan (ENTRESTO) 24-26 MG Take 1 tablet by mouth daily.   ? tiZANidine (ZANAFLEX) 4 MG tablet Take 1 tablet (4 mg total) by mouth every 6 (six) hours as needed for muscle spasms.   ? vortioxetine HBr (TRINTELLIX) 5 MG TABS tablet Take 1 tablet (5 mg total) by mouth daily. 10/14/2021: Reports he is taking 2-3 times/week.  ? diclofenac Sodium (VOLTAREN) 1 % GEL Apply 2 g topically 4 (four) times daily. (Patient not taking: Reported on 09/27/2021)   ? gabapentin (NEURONTIN) 100 MG capsule Take 1 capsule (100 mg total) by mouth at bedtime. (Patient not taking: Reported on 10/04/2021)   ? pravastatin (PRAVACHOL) 20 MG tablet Take 1 tablet (20 mg total) by mouth daily. (Patient not taking: Reported on 10/14/2021)   ? prednisoLONE acetate (PRED FORTE) 1 % ophthalmic suspension Place 1 drop into both eyes 2 (two) times daily. (Patient not taking: Reported on 10/04/2021)   ? ?No facility-administered encounter medications on file as of 10/14/2021.  ? ? ?Patient Active Problem List  ? Diagnosis Date Noted  ? 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 Diarrhea 11/04/2020  ? Rectal bleeding 11/04/2020  ? Trochanteric bursitis of right hip   ? Transient ischemic attack   ? NSTEMI (non-ST elevated myocardial infarction) ([place])   ? Nephrolithiasis   ? Morbid obesity ([place])   ? Metabolic syndrome   ? Major depression, chronic   ? Long-term use of aspirin therapy   ? Hypercholesterolemia   ? Hiatal hernia   ? GERD (gastroesophageal reflux disease)   ? Essential tremor   ? Essential hypertension   ? Erectile dysfunction   ?  Elevated PSA   ? Colon cancer ([place])   ? Chronic coronary artery disease   ? Arthritis   ? Myofascial pain 12/20/2019  ? 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 Low back pain 10/28/2019  ? Radiculopathy, lumbar region 10/28/2019  ? Fatigue 01/15/2019  ? Chronic systolic (congestive) heart failure ([place]) 09/18/2018  ? Ischemic cardiomyopathy 09/16/2018  ? Aortic regurgitation 09/16/2018  ? Cardiomyopathy, unspecified ([place]) 06/13/2018  ? Abscess of right axilla 12/12/2017  ? BMI 33.0-33.9,adult 12/12/2017  ? 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 Acute blood loss anemia 10/25/2017  ? Acute postoperative respiratory insufficiency 10/25/2017  ? Postoperative delirium 10/25/2017  ? Dyslipidemia 10/17/2017  ? SOB (shortness of breath) 03/31/2017  ? Long term current use of aspirin 03/29/2017  ? Parkinson's disease ([place]) 01/02/2017  ? Morbid (severe) obesity due to excess calories ([place]) 10/27/2015  ? Memory change 07/06/2015  ? Depression, recurrent ([place]) 07/06/2015  ? Medial meniscus tear 10/11/2011  ? Neuroma of foot 10/11/2011  ? Coronary artery disease involving native coronary artery of native heart with angina pectoris ([place]) 12/28/2010  ? OTHER TESTICULAR HYPOFUNCTION 05/20/2010  ? Mixed hyperlipidemia 05/20/2010  ? Hypertensive heart disease with heart failure ([place]) 05/20/2010  ? 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 05/20/2010  ? GERD 05/20/2010  ? TRANSIENT ISCHEMIC ATTACK, HX OF 05/20/2010  ? NEPHROLITHIASIS, HX OF 05/20/2010  ? BENIGN PROSTATIC HYPERTROPHY, HX OF, S/P TURP 05/20/2010  ? ? ?Conditions to be addressed/monitored:Depression and Parkinson's disease, Chronic pain ? ?Care Plan : RN Case Manager Plan of Care  ?Updates made by Luretha Rued, RN since 10/14/2021 12:00 AM  ?  ? ?Problem: Chronic Disease Managment Education and/or Care Coordination needs   ?Priority: High  ?  ? ?Long-Range Goal: Development of plan of care for Chronic Disease Management and/or care coordination  needs   ?Start Date: 09/23/2021  ?Expected End Date: 03/23/2022  ?Priority: High  ?Note:   ?Current Barriers: Mr. Sainvil reports some improvement. He reports he has his medications and is taking as prescribed. Except he takes Trintellix 2-3x/week vs daily as prescribed, adding this works better for him. He states he will start physical therapy sessions on Monday. Mr. Toro reports he went out with a friend for the first time in 6 years and expressed his feelings (anxiety) related to getting out and socializing again. Encouraged Mr. Toral to continue to work with [employer] ?Knowledge Deficits related to plan of care for management of Chronic Pain and Movement Disorder Parkinson's Disease  ?Care Coordination needs related to Transportation and Medication Reconciliation and Medication management  ?PHQ2 6 PHQ9 -18. Currently being followed by LCSW Althea Charon for Psychotherapeutic services/neurology office  ?Limited social support ? ?RNCM Clinical Goal(s):  ?Patient will verbalize understanding of plan for management of Chronic Pain, Movement Disorder Parkinson's Disease and Mental Health Concerns as evidenced by self report and/or chart notation ?take all medications exactly as  prescribed and will call provider for medication related questions as evidenced by self report and/or chart notation    through collaboration with RN Care manager, provider, and care team.  ? ?Interventions: ?1:1 collaboration with primary care provider regarding development and update of comprehensive plan of care as evidenced by provider attestation and co-signature ?Inter-disciplinary care team collaboration (see longitudinal plan of care) ?Evaluation of current treatment plan related to  self management and patient's adherence to plan as established by provider ? ?Depression Interventions:  (Status:  Goal on track:  Yes.)  Long Term Goal ?Encouraged to continue to work with LCSW as recommended/scheduled.   ?RNCM allowed time for Mr.  Situ to discuss to express feelings related to getting out and socializing ?Encouraged to explore social activities at locations such as church, senior citizens center, [employer].  ?RNCM provided positive feedback r

## 2021-10-14 NOTE — Patient Instructions (Addendum)
Visit Information ? ?Thank you for taking time to visit with me today. Please don't hesitate to contact me if I can be of assistance to you before our next scheduled telephone appointment. ? ?Following are the goals we discussed today:  ?Patient Goals/Self-Care Activities: ?Participate in physical therapy sessions as recommended/scheduled ?Take medications as prescribed   ?Attend all scheduled provider appointments ?Call provider office for new concerns or questions  ?Work with the Education officer, museum to address care coordination needs and will continue to work with the clinical team to address health care and disease management related needs ?Contact your nurse case manager for care management and/or care coordination needs ?Continue to seek activities outside your home such as: church, senior citizens center, [employer]. ? ?Our next appointment is by telephone on 11/11/21 at 11:00 am ? ?Please call the care guide team at [phone] if you need to cancel or reschedule your appointment.  ? ?If you are experiencing a Mental Health or [place] or need someone to talk to, please call the Suicide and Crisis Lifeline: 988 ?call 1-800-273-TALK (toll free, 24 hour hotline)  ? ?Patient verbalizes understanding of instructions and care plan provided today and agrees to view in [place]. Active MyChart status confirmed with patient.   ? ?Thea Silversmith, RN, MSN, BSN, CCM ?Care Management Coordinator ?[facility] ?[phone]  ?

## 2021-10-18 DIAGNOSIS — R260 Ataxic gait: Secondary | ICD-10-CM | POA: Diagnosis not present

## 2021-10-18 DIAGNOSIS — M79604 Pain in right leg: Secondary | ICD-10-CM | POA: Diagnosis not present

## 2021-10-18 DIAGNOSIS — G20 Parkinson's disease: Secondary | ICD-10-CM | POA: Diagnosis not present

## 2021-10-18 DIAGNOSIS — M4726 Other spondylosis with radiculopathy, lumbar region: Secondary | ICD-10-CM | POA: Diagnosis not present

## 2021-10-19 ENCOUNTER — Telehealth: Payer: Self-pay | Admitting: Licensed Clinical Social Worker

## 2021-10-19 NOTE — Telephone Encounter (Signed)
Student Social Worker, Eartha Inch, called Pt to check in on how he was doing and if he had signed up for dance classes. Pt indicated that he started therapy yesterday and that seemed to help and he was doing better. He also noted that he had not yet signed up for dance classes because he was waiting to see how is schedule lined up with therapy and had lost his flyers. Student social worker sent him a follow up email with information about the Parkinson's dance classes, as well as a flyer with other community care options. Student Education officer, museum also sent contact information for a dentist near Pt for him to set up an appointment as he requested. Pt seemed in high spirits. ?

## 2021-10-22 ENCOUNTER — Other Ambulatory Visit: Payer: Self-pay

## 2021-10-22 ENCOUNTER — Emergency Department (HOSPITAL_COMMUNITY)
Admission: EM | Admit: 2021-10-22 | Discharge: 2021-10-23 | Disposition: A | Discharge status: Home / Self Care | Payer: Medicare Other | Attending: Emergency Medicine | Admitting: Emergency Medicine

## 2021-10-22 ENCOUNTER — Ambulatory Visit: Payer: Medicare Other | Admitting: Pharmacist

## 2021-10-22 ENCOUNTER — Encounter (HOSPITAL_COMMUNITY): Payer: Self-pay | Admitting: Emergency Medicine

## 2021-10-22 ENCOUNTER — Emergency Department (HOSPITAL_COMMUNITY): Payer: Medicare Other

## 2021-10-22 DIAGNOSIS — R0981 Nasal congestion: Secondary | ICD-10-CM | POA: Insufficient documentation

## 2021-10-22 DIAGNOSIS — R0602 Shortness of breath: Secondary | ICD-10-CM | POA: Insufficient documentation

## 2021-10-22 DIAGNOSIS — J302 Other seasonal allergic rhinitis: Secondary | ICD-10-CM | POA: Diagnosis not present

## 2021-10-22 DIAGNOSIS — Z79899 Other long term (current) drug therapy: Secondary | ICD-10-CM

## 2021-10-22 DIAGNOSIS — T7849XA Other allergy, initial encounter: Secondary | ICD-10-CM | POA: Insufficient documentation

## 2021-10-22 DIAGNOSIS — I447 Left bundle-branch block, unspecified: Secondary | ICD-10-CM | POA: Diagnosis not present

## 2021-10-22 DIAGNOSIS — I10 Essential (primary) hypertension: Secondary | ICD-10-CM | POA: Insufficient documentation

## 2021-10-22 DIAGNOSIS — Z20822 Contact with and (suspected) exposure to covid-19: Secondary | ICD-10-CM | POA: Insufficient documentation

## 2021-10-22 DIAGNOSIS — R251 Tremor, unspecified: Secondary | ICD-10-CM | POA: Insufficient documentation

## 2021-10-22 DIAGNOSIS — G20 Parkinson's disease: Secondary | ICD-10-CM

## 2021-10-22 DIAGNOSIS — R6889 Other general symptoms and signs: Secondary | ICD-10-CM | POA: Diagnosis not present

## 2021-10-22 DIAGNOSIS — Z7982 Long term (current) use of aspirin: Secondary | ICD-10-CM | POA: Diagnosis not present

## 2021-10-22 DIAGNOSIS — Z9109 Other allergy status, other than to drugs and biological substances: Secondary | ICD-10-CM

## 2021-10-22 DIAGNOSIS — Z951 Presence of aortocoronary bypass graft: Secondary | ICD-10-CM | POA: Diagnosis not present

## 2021-10-22 DIAGNOSIS — F339 Major depressive disorder, recurrent, unspecified: Secondary | ICD-10-CM

## 2021-10-22 DIAGNOSIS — R0789 Other chest pain: Secondary | ICD-10-CM | POA: Diagnosis not present

## 2021-10-22 DIAGNOSIS — E782 Mixed hyperlipidemia: Secondary | ICD-10-CM

## 2021-10-22 DIAGNOSIS — Z743 Need for continuous supervision: Secondary | ICD-10-CM | POA: Diagnosis not present

## 2021-10-22 DIAGNOSIS — I2510 Atherosclerotic heart disease of native coronary artery without angina pectoris: Secondary | ICD-10-CM | POA: Diagnosis not present

## 2021-10-22 DIAGNOSIS — R079 Chest pain, unspecified: Secondary | ICD-10-CM | POA: Diagnosis not present

## 2021-10-22 LAB — CBC WITH DIFFERENTIAL/PLATELET
Abs Immature Granulocytes: 0.02 10*3/uL (ref 0.00–0.07)
Basophils Absolute: 0 10*3/uL (ref 0.0–0.1)
Basophils Relative: 0 %
Eosinophils Absolute: 0.2 10*3/uL (ref 0.0–0.5)
Eosinophils Relative: 2 %
HCT: 48.4 % (ref 39.0–52.0)
Hemoglobin: 16.2 g/dL (ref 13.0–17.0)
Immature Granulocytes: 0 %
Lymphocytes Relative: 31 %
Lymphs Abs: 2.8 10*3/uL (ref 0.7–4.0)
MCH: 32.1 pg (ref 26.0–34.0)
MCHC: 33.5 g/dL (ref 30.0–36.0)
MCV: 95.8 fL (ref 80.0–100.0)
Monocytes Absolute: 0.8 10*3/uL (ref 0.1–1.0)
Monocytes Relative: 9 %
Neutro Abs: 5.2 10*3/uL (ref 1.7–7.7)
Neutrophils Relative %: 58 %
Platelets: 202 10*3/uL (ref 150–400)
RBC: 5.05 MIL/uL (ref 4.22–5.81)
RDW: 12.3 % (ref 11.5–15.5)
WBC: 9 10*3/uL (ref 4.0–10.5)
nRBC: 0 % (ref 0.0–0.2)

## 2021-10-22 LAB — URINALYSIS, ROUTINE W REFLEX MICROSCOPIC
Bilirubin Urine: NEGATIVE
Glucose, UA: NEGATIVE mg/dL
Hgb urine dipstick: NEGATIVE
Ketones, ur: NEGATIVE mg/dL
Leukocytes,Ua: NEGATIVE
Nitrite: NEGATIVE
Protein, ur: NEGATIVE mg/dL
Specific Gravity, Urine: 1.01 (ref 1.005–1.030)
pH: 6 (ref 5.0–8.0)

## 2021-10-22 LAB — TROPONIN I (HIGH SENSITIVITY): Troponin I (High Sensitivity): 16 ng/L (ref <18)

## 2021-10-22 LAB — BASIC METABOLIC PANEL
Anion gap: 8 (ref 5–15)
BUN: 13 mg/dL (ref 8–23)
CO2: 25 mmol/L (ref 22–32)
Calcium: 9.2 mg/dL (ref 8.9–10.3)
Chloride: 104 mmol/L (ref 98–111)
Creatinine, Ser: 0.95 mg/dL (ref 0.61–1.24)
GFR, Estimated: >60 mL/min (ref >60)
Glucose, Bld: 101 mg/dL — ABNORMAL HIGH (ref 70–99)
Potassium: 3.8 mmol/L (ref 3.5–5.1)
Sodium: 137 mmol/L (ref 135–145)

## 2021-10-22 LAB — BRAIN NATRIURETIC PEPTIDE: B Natriuretic Peptide: 253.8 pg/mL — ABNORMAL HIGH (ref 0.0–100.0)

## 2021-10-22 LAB — RESP PANEL BY RT-PCR (FLU A&B, COVID) ARPGX2
Influenza A by PCR: NEGATIVE
Influenza B by PCR: NEGATIVE
SARS Coronavirus 2 by RT PCR: NEGATIVE

## 2021-10-22 NOTE — Chronic Care Management (AMB) (Addendum)
?Patient Care Plan: General Pharmacy (Adult)  ?  ? ?Problem Identified: Chronic Conditions: Parkinson's Disease; HTN; GERD; CAD; chronic pain;   ?  ? ?Long-Range Goal: Provide education, support and care coordination for medication therapy and chronic conditions   ?Start Date: 10/04/2021  ?Priority: High  ?Note:   ?Current Barriers:  ?Unable to achieve control of Parkinson's Disease  ?Does not adhere to prescribed medication regimen ?Does not maintain contact with provider office ? ?Pharmacist Clinical Goal(s):  ?Over the next 90 days, patient will achieve adherence to monitoring guidelines and medication adherence to achieve therapeutic efficacy ?achieve control of Parkinson's Disease as evidenced by improved symptoms and decrease in suspected side effects ?contact provider office for questions/concerns as evidenced notation of same in electronic health record through collaboration with PharmD and provider.  ? ?Interventions: ?1:1 collaboration with [name], DO regarding development and update of comprehensive plan of care as evidenced by provider attestation and co-signature ?Inter-disciplinary care team collaboration (see longitudinal plan of care) ?Comprehensive medication review performed; medication list updated in electronic medical record ? ?Parkinson's Disease:  ?Goal: decrease symptoms while minimizing medication side effects ?Managed by Dr Wells Guiles [name] - next appt 10/08/2021 ?Currently therapy:  ?Carbidopa - levodopa (Sinemet ER) 50/'200mg'$  - take 1 tablet at bedtime ?Carbidopa - levodopa (Sinemet ER) 25/'100mg'$  - take 2 tablets at 7am, 2 tablets at 11am and 1 tablet at 4pm ?Patient receives medication from adherence packaging but admits to skipping doses of carbidopa - levodopa or halfing tablets due to concerns for side effects. He reports that he feels that carbidopa-levodopa causes drowsiness / fogginess.  ?Interventions:  ?Sent message to Dr [name] regarding low adherence and concerns of side  effects. She will discuss further with patient at upcoming appointment ?Recommended patient NOT split extended release carbidopa - levodopa as this might effect absorption and cause quicker release and thus increase in side effects.  ?Recommended yearly skin checks with dermatology due to risk of melanoma with Parkinson's Diseases / treatment. Patient is agreeable to dermatology referral.  ?Patient would like physical therapy - will ask PCP to make referral if agreeable.  ? ? ?Hypertension / CHF: ?Goal: blood pressure < 140/90 while minimizing orthostatic hypotension and management of CHF symptoms while preventing exacerbations / hospitalizations ?Controlled ?Current treatment: ?Furosemide '40mg'$  -  take 1 tablet 3 times per week ?Metoprolol tartrate '25mg'$  - take 1 tablet daily  ?Entresto 24/'26mg'$  - take 1 tablet daily  ?Entresto and Metoprolol tartrate dose not optimized but noted that dose was decreased by Dr [name] / cardiologist 11/18/2020 due to orthostatic hypotension.  ?Interventions:  ?Discussed signs and symptoms of CHF exacerbation - weight gain, SOB, abdominal fullness, swelling in legs or abdomen, Fatigue and weakness, changes in ability to perform usual activities, persistent cough or wheezing with white or pink blood-tinged mucus, nausea and lack of appetite ?Recommended he weigh daily - report weight gain of more than 3 lbs in 24 hours or 5 lbs in 1 week. ?Continue current regimen for hypertension and CHF ?Continue to follow up with Dr [name], cardiologist ? ?Hyperlipidemia / CAD: ?LDL improved but not at goal of < 70; Triglycerides at goal of < 150 ? ?Lab Results  ?Component Value Date  ? [place] 96 09/27/2021  ? LDLCALC 125 (H) 03/25/2021  ? LDLCALC 125 (H) 11/18/2020  ? ?Lab Results  ?Component Value Date  ? TRIG 120 09/27/2021  ? TRIG 174 (H) 03/25/2021  ? TRIG 148 11/18/2020  ?Current treatment: ?Aspirin '81mg'$  daily  ?Pravastatin  $'20mg'X$  daily  ?Medications previously tried: ezetimibe-simvastatin  (Vytorin) - unsure why stopped, rosuvastatin - stopped 04/24/2014 due to whole body aching, atorvastatin - stopped around 2029 but unsure why, possibly muscle aches ?Interventions:  ?LDL not at goal but improved. Recommend consider addition of ezetimibe if next LDL > 70 ?Continue current regimen  ? ?Depression ?Uncontrolled/controlled ?Current treatment: ?Trintellix '5mg'$  once a day ?Connected with Chronic Care Management social worker for mental health support ?Patient has spoken with social worker at Dr Doristine Devoid office, [place] in past.  ?Interventions:  ?Reviewed importance of taking Trintellix every day.  ?Patient to have follow up with Chronic Care Management social worker 10/11/2021. He will discuss with Dr [name] about seeing social worker in her office is needed as well.  ? ?Medication management ?Pharmacist Clinical Goal(s): ?Over the next 90 days, patient will work with PharmD and providers to maintain optimal medication adherence ?Current pharmacy: [facility] ?Uses adherence packaging but removes medications he feels are causing side effects, specifically carbidopa-levodopa ?Patient requests refills for gabapentin ?Interventions ?Comprehensive medication review performed. ?Reviewed refill history and assessed adherence ?Coordinated with Dr [name] neurologist to make her aware that patient is not taking medications for Parkinson's Disease as prescribed due to concerns for side effects. Dr [name] to discuss further with patient at appointment 10/08/2021 ?Encouraged patient to take all medications as prescribed and to use his adherence packaging just as it is set up. He should communicate medication problems or suspected side effect to either the clinical pharmacist or a medical provider so that adjustments can be made.  ?Coordinating with PCP for refills on gabapentin. Patient reminded to monitor for drowsiness / sleepiness with gabapentin.  ? ?Patient Goals/Self-Care Activities ?Over the next 90 days, patient will:   ?Focus on medication adherence by filling medications appropriately  ?Take medications as prescribed ?Report any questions or concerns to PharmD and/or provider(s). Is it important for your medical team to be aware if you suspect you are having side effects or taking medications differently.  ?Weigh daily, and contact provider if weight gain of of more than 3 lbs in 24 hours or 5 lbs in 1 week ? ? ?Follow Up Plan: Telephone follow up appointment with care management team member scheduled for:  10/22/2021  at 11:15am   ? ? ?  ? ? ?

## 2021-10-22 NOTE — ED Triage Notes (Signed)
Pt arrive by EMS from for c/o increase SOB that started at 3am today with some soreness on his chest. ?

## 2021-10-22 NOTE — ED Provider Triage Note (Signed)
Emergency Medicine Provider Triage Evaluation Note ? ?BOY DELAMATER , a 76 y.o. male  was evaluated in triage.  Pt complains of shortness of breath. States that he woke up and experienced increased work of breathing. Worse with exertion. He also has associated chest soreness across bilateral nipple lines. Hx of parkinsons and is with chronic left sided tremor. ? ?Review of Systems  ?Positive: Chest pain, shortness of breath ?Negative:  ? ?Physical Exam  ?BP (!) 140/98 (BP Location: Right Arm)   Pulse 86   Temp 98.4 ?F (36.9 ?C) (Oral)   Resp 18   Ht '5\' 5"'$  (1.651 m)   Wt 93.7 kg   SpO2 93%   BMI 34.38 kg/m?  ?Gen:   Awake, no distress   ?Resp:  Normal effort  ?MSK:   Moves extremities without difficulty  ?Other:  On RA. Lungs clear. Heart RRR, no murmurs ? ?Medical Decision Making  ?Medically screening exam initiated at 9:17 PM.  Appropriate orders placed.  ONDRA DEBOARD was informed that the remainder of the evaluation will be completed by another provider, this initial triage assessment does not replace that evaluation, and the importance of remaining in the ED until their evaluation is complete. ? ? ?  ?Adolphus Birchwood, PA-C ?10/22/21 2119 ? ?

## 2021-10-22 NOTE — Chronic Care Management (AMB) (Signed)
? ? ?Chronic Care Management ?Pharmacy Note ? ?10/24/2021 ?Name:  Shawn Meza MRN:  [id number] DOB:  August 26, 1945 ? ?Summary: ?Dr [name] changed parkinson's medication to Rytary 36.25/145mg three times a day (given samples). Patient took for 2 days but felt it was causing stomach pain and stopped. But he is planning to retry. He has started physical therapy.  ?Patient requested follow up with ophthalmologist (he would like to see different ophthalmologist than in past, he it looks like he saw [facility]) ?Recommended yearly skin checks with dermatologist due to PD disease / treatment. Patient is agreeable to dermatology referral.  ?Patient reported that he is not taking pravastatin, he thought that cardiologist had told him to stop.  Reviewed last cardio notes which mention continuing statin.  ?Patient mentioned having head / nasal congestion. He reports that he too dramamine yesterday but he doesn't like to take it because it makes him sleepy ? ?Plan: ?Discussed benefits of Rytary Encouraged patient to restart. Also discussed adherence. Suggested setting an alarm on his phone to help remember to take medications. Patient will also bring in medications with him to upcoming appointment, 10/25/21 with PCP.  ?Referral to dermatology and ophthalmology if approved by PCP - patient will see Dr [name].  ?Discussed benefits of statin therapy and that cardiologist's last noted recommended to continue statin therapy.  ?Recommended non drowsy antihistamine like loratadine / Claritin. Also can take Afrin NS if needed for nasal congestion but only 2 to 3 days or steroid NS like fluticasone / Flonase.  ? ? ?Subjective: ?Shawn Meza is an 76 y.o. year old male who is a primary patient of [name], DO.  The CCM team was consulted for assistance with disease management and care coordination needs.   ? ?Engaged with patient by telephone for follow up visit in response to provider referral for pharmacy  case management and/or care coordination services.  ? ?Consent to Services:  ?The patient was given information about Chronic Care Management services, agreed to services, and gave verbal consent prior to initiation of services.  Please see initial visit note for detailed documentation.  ? ?Patient Care Team: ?[name], DO as PCP - General (Family Medicine) ?Richardo Priest, MD as PCP - Cardiology (Cardiology) ?[name], MD (General Surgery) ?[name], MD as Consulting Physician (General Surgery) ?Ludwig Clarks, DO as Consulting Physician (Neurology) ?Luretha Rued, RN as Case Manager ?[name], RPH-CPP (Pharmacist) ? ?Recent office visits: ?08/13/21-Nicholas Nolene Ebbs, DO (PCP, Video visit) Seen for back pain. Start Voltaren 1% gel and Tizanidine 4 mg. Follow up in 1 month. ?  ?Recent consult visits:  ?10/19/2021 - Neuro / LCSW - phone call. Started physical therapy. Has not started recommended dance classes. Mood reported to have improved.  ?10/12/2021 - Neuro / LCSW [name]) Seen for counseling. ?10/08/2021 - Neuro (Dr [name]) F/U Parkinson's Disease. Given samples of Rytary 122m to take 3 times a day. [facility] Referred for physical therapy.  ?09/27/21-Brian JMora Bellman MD (Cardiology) Cardiac follow up visit. Follow up in 6 months. ?09/16/21-Jeremy EHinton Dyer MD (Sports medicine) Seen for acute on chronic ride sided radicular pain. ?08/23/21-Misty A. Taylor-Paladino, LCSW. (Neurology, Video visit) Seen for depression. Follow up in 4 weeks. ?04/29/21-Dave S. Eichman (Anesthesiology) Notes not available.  ?04/08/2022 - Neurology (Dr [name]) Seen for f/u Parkinson's Disease. Continue carbidopa/levodopa 25/100 CR, 2 tablets at 8 AM/2 tablets at noon/1 tablet at 4 PM. Continue carbidopa/levodopa 50/200 CR  at bedtime.  Depression -On Trintellix.  Managed by primary care.  Declines psychiatry.  I still think that this is a big issue in patient's  life. B12 deficiency -On supplementation; Lumbar spinal stenosis -Following with Dr. Vertell Limber.  Phone number was given to patient to contact for steroid injections;  Insomnia -Continue melatonin to 3 mg. ?  ?Hospital visits:  ?08/05/2021 - ED Visit at [facility] due to Leg pain.  ?New?Medications Started at [facility] Discharge:?? ?predniSONE (DELTASONE) 20 MG tablet  Daily      ?Lidocaine 4% patch every 12 hours prn ?Medication Changes at Hospital Discharge: ?None noted ?Medications Discontinued at Hospital Discharge: ?None noted ? ? ?Objective: ? ?Lab Results  ?Component Value Date  ? CREATININE 0.95 10/22/2021  ? CREATININE 0.98 09/27/2021  ? CREATININE 0.93 03/25/2021  ? ? ?Lab Results  ?Component Value Date  ? HGBA1C 5.7 (H) 12/19/2018  ? ?Last diabetic Eye exam: No results found for: HMDIABEYEEXA  ?Last diabetic Foot exam: No results found for: HMDIABFOOTEX  ? ?   ?Component Value Date/Time  ? CHOL 160 09/27/2021 1440  ? TRIG 120 09/27/2021 1440  ? TRIG 107 05/09/2010 0000  ? HDL 42 09/27/2021 1440  ? CHOLHDL 3.8 09/27/2021 1440  ? CHOLHDL 3.6 05/20/2015 1410  ? VLDL 27 05/20/2015 1410  ? LDLCALC 96 09/27/2021 1440  ? ? ? ?  Latest Ref Rng & Units 09/27/2021  ?  2:40 PM 03/25/2021  ?  4:44 PM 08/11/2020  ?  2:19 PM  ?Hepatic Function  ?Total Protein 6.0 - 8.5 g/dL 6.7   6.8   6.9    ?Albumin 3.7 - 4.7 g/dL 4.4   4.2   4.1    ?AST 0 - 40 IU/L '20   25   23    ' ?ALT 0 - 44 IU/L '18   20   31    ' ?Alk Phosphatase 44 - 121 IU/L 72   71   69    ?Total Bilirubin 0.0 - 1.2 mg/dL 0.4   0.3   0.5    ? ? ?Lab Results  ?Component Value Date/Time  ? TSH 1.73 01/22/2020 03:28 PM  ? TSH 2.010 12/19/2018 11:23 AM  ? ? ? ?  Latest Ref Rng & Units 10/22/2021  ?  9:23 PM 03/30/2020  ?  4:02 PM 03/30/2020  ?  3:54 PM  ?CBC  ?WBC 4.0 - 10.5 K/uL 9.0    9.6    ?Hemoglobin 13.0 - 17.0 g/dL 16.2   15.6   15.6    ?Hematocrit 39.0 - 52.0 % 48.4   46.0   48.3    ?Platelets 150 - 400 K/uL 202    189    ? ? ?Lab Results  ?Component Value  Date/Time  ? VD25OH 28.37 (L) 07/28/2020 01:34 PM  ? VD25OH 19.92 (L) 01/22/2020 03:28 PM  ? ? ?Clinical ASCVD: Yes  ?The ASCVD Risk score (Arnett DK, et al., 2019) failed to calculate for the following reasons: ?  The patient has a prior MI or stroke diagnosis   ? ? ? ?Social History  ? ?Tobacco Use  ?Smoking Status Never  ? Passive exposure: Never  ?Smokeless Tobacco Never  ? ?BP Readings from Last 3 Encounters:  ?10/23/21 (!) 157/100  ?10/08/21 126/83  ?09/27/21 120/60  ? ?Pulse Readings from Last 3 Encounters:  ?10/23/21 93  ?10/08/21 72  ?09/27/21 92  ? ?Wt Readings from Last 3 Encounters:  ?10/22/21 206 lb 9.1 oz (93.7  kg)  ?10/08/21 206 lb 9.6 oz (93.7 kg)  ?09/27/21 205 lb (93 kg)  ? ? ?Assessment: Review of patient past medical history, allergies, medications, health status, including review of consultants reports, laboratory and other test data, was performed as part of comprehensive evaluation and provision of chronic care management services.  ? ?SDOH:  (Social Determinants of Health) assessments and interventions performed:  ? ? ?CCM Care Plan ? ?Allergies  ?Allergen Reactions  ? Rosuvastatin Other (See Comments)  ?  Whole body aches  ? Testosterone Other (See Comments)  ?  ABDOMINAL PAIN and cramping  ? Fluoxetine Other (See Comments)  ?  Caused depression and aggression  ? Requip [Ropinirole] Nausea Only  ? Tizanidine Hcl Hives  ? ? ?Medications Reviewed Today   ? ? Reviewed by [name], RPH-CPP (Pharmacist) on 10/22/21 at 52  Med List Status: <None>  ? ?Medication Order Taking? Sig Documenting Provider Last Dose Status Informant  ?aspirin 81 MG EC tablet [id number] Yes Take 1 tablet (81 mg total) by mouth daily. [name], DO Taking Active   ?Carbidopa-Levodopa ER (RYTARY) 36.25-145 MG CPCR [id number] No Samples of this drug were given to the patient, quantity 2, Lot Number [id number] A Exp 08/23  ?Patient not taking: Reported on 10/22/2021  ? Ludwig Clarks, DO Not Taking Active    ?diclofenac Sodium (VOLTAREN) 1 % GEL [id number] No Apply 2 g topically 4 (four) times daily.  ?Patient not taking: Reported on 09/27/2021  ? [name], DO Not Taking Active   ?esomeprazole ([place])

## 2021-10-23 DIAGNOSIS — R0602 Shortness of breath: Secondary | ICD-10-CM | POA: Diagnosis not present

## 2021-10-23 LAB — TROPONIN I (HIGH SENSITIVITY): Troponin I (High Sensitivity): 16 ng/L (ref <18)

## 2021-10-23 MED ORDER — FLUTICASONE PROPIONATE 50 MCG/ACT NA SUSP: 1.0000 | Freq: Every day | NASAL | 0 refills | Status: DC

## 2021-10-23 MED ORDER — DEXAMETHASONE 4 MG PO TABS
4.0000 mg | ORAL_TABLET | Freq: Once | ORAL | Status: AC
  Administered 2021-10-23: 4 mg via ORAL
  Filled 2021-10-23: qty 1

## 2021-10-23 MED ORDER — CETIRIZINE HCL 5 MG PO TABS: 5.0000 mg | ORAL_TABLET | Freq: Every day | ORAL | 1 refills | Status: DC

## 2021-10-23 NOTE — ED Provider Notes (Signed)
?[facility] ?Provider Note ? ? ?CSN: [id number] ?Arrival date & time: 10/22/21  2106 ? ?  ? ?History ? ?Chief Complaint  ?Patient presents with  ? Shortness of Breath  ? ? ?Shawn Meza is a 76 y.o. male. ? ?Patient is a 76 year old male with a history of GERD, Parkinson's disease, depression, hypertension, CAD status post CABG who is presenting today with complaint of feeling short of breath.  Patient reports that he started feeling short of breath yesterday afternoon after he mowed his yard.  He called his doctor and they recommended he go to the pharmacy and get some allergy medication as he does suffer from allergies.  He reports getting some over-the-counter stuff but at 3 AM he woke up and felt like he could not breathe out of his nose at all.  He reports when he breathes out of his mouth it makes his stomach hurt and it is hard for him to breathe out of his mouth.  Because he could not breathe out of his nose it started making him feel very anxious and he decided to come to the emergency room.  He has not had much of a cough denies any chest pain.  Currently he feels like he is breathing better.  He does not use any inhalers at home and does not use nasal spray regularly.  He did try some over-the-counter medication last night but reports it just made more of a mess than it did help with his symptoms.  He has not had any nausea, vomiting, diarrhea.  He does report over the last 3 weeks things have been very difficult for him.  He had been dealing with sciatic issues which were starting to get better but he took his dog to the vet and when he was putting her back in the car he dropped her and he had to grab her and he fell which made his sciatic nerve problems worse again and then his sister's grandson died this week so he has been dealing with a lot emotionally as well.  He last saw his neurologist a few weeks ago and they did change his Parkinson's medication.  He  reports that it was hurting his stomach so he initially had discontinued it but he is trying to start taking it again with milk. ? ?The history is provided by the patient.  ?Shortness of Breath ? ?  ? ?Home Medications ?Prior to Admission medications   ?Medication Sig Start Date End Date Taking? Authorizing Provider  ?cetirizine (ZYRTEC) 5 MG tablet Take 1 tablet (5 mg total) by mouth daily. 10/23/21  Yes Blanchie Dessert, MD  ?fluticasone (FLONASE) 50 MCG/ACT nasal spray Place 1 spray into both nostrils daily. 10/23/21 01/21/22 Yes Blanchie Dessert, MD  ?aspirin 81 MG EC tablet Take 1 tablet (81 mg total) by mouth daily. 08/30/21   [name], DO  ?Carbidopa-Levodopa ER (RYTARY) 36.25-145 MG CPCR Samples of this drug were given to the patient, quantity 2, Lot Number [id number] A Exp 08/23 ?Patient not taking: Reported on 10/22/2021 10/08/21   [name], Eustace Quail, DO  ?diclofenac Sodium (VOLTAREN) 1 % GEL Apply 2 g topically 4 (four) times daily. ?Patient not taking: Reported on 09/27/2021 08/13/21   [name], DO  ?esomeprazole (NEXIUM) 40 MG capsule Take 1 capsule (40 mg total) by mouth at bedtime. 09/03/21   [name], DO  ?FLOVENT HFA 110 MCG/ACT inhaler Inhale 1 puff into the lungs daily as needed (wheezing,  shortness of breath). 08/09/21   [name], DO  ?furosemide (LASIX) 40 MG tablet Take 1 tablet (40 mg total) by mouth 3 (three) times a week. ?Patient taking differently: Take 20 mg by mouth 3 (three) times a week. 11/18/20   Richardo Priest, MD  ?gabapentin (NEURONTIN) 100 MG capsule Take 1 capsule (100 mg total) by mouth at bedtime. ?Patient not taking: Reported on 10/04/2021 08/28/20   [name], DO  ?metoprolol tartrate (LOPRESSOR) 25 MG tablet Take 1 tablet (25 mg total) by mouth daily. 11/18/20   Richardo Priest, MD  ?nitroGLYCERIN (NITROSTAT) 0.4 MG SL tablet Place 1 tablet (0.4 mg total) under the tongue every 5 (five) minutes x 3 doses as needed for  chest pain. 08/28/20   [name], DO  ?pravastatin (PRAVACHOL) 20 MG tablet Take 1 tablet (20 mg total) by mouth daily. ?Patient not taking: Reported on 10/14/2021 08/30/21   [name], DO  ?prednisoLONE acetate (PRED FORTE) 1 % ophthalmic suspension Place 1 drop into both eyes 2 (two) times daily. ?Patient not taking: Reported on 10/04/2021    [provider]  ?sacubitril-valsartan (ENTRESTO) 24-26 MG Take 1 tablet by mouth daily. 11/18/20   Richardo Priest, MD  ?tiZANidine (ZANAFLEX) 4 MG tablet Take 1 tablet (4 mg total) by mouth every 6 (six) hours as needed for muscle spasms. 08/13/21   [name], DO  ?vortioxetine HBr (TRINTELLIX) 5 MG TABS tablet Take 1 tablet (5 mg total) by mouth daily. ?Patient taking differently: Take 2.5 mg by mouth daily. 05/28/21   [name], DO  ?   ? ?Allergies    ?Rosuvastatin, Testosterone, Fluoxetine, Requip [ropinirole], and Tizanidine hcl   ? ?Review of Systems   ?Review of Systems  ?Respiratory:  Positive for shortness of breath.   ? ?Physical Exam ?Updated Vital Signs ?BP 128/63 (BP Location: Right Arm)   Pulse 81   Temp 98 ?F (36.7 ?C)   Resp 17   Ht '5\' 5"'$  (1.651 m)   Wt 93.7 kg   SpO2 97%   BMI 34.38 kg/m?  ?Physical Exam ?Vitals and nursing note reviewed.  ?Constitutional:   ?   General: He is not in acute distress. ?   Appearance: He is well-developed.  ?HENT:  ?   Head: Normocephalic and atraumatic.  ?   Nose: Congestion present.  ?   Mouth/Throat:  ?   Mouth: Mucous membranes are moist.  ?Eyes:  ?   Conjunctiva/sclera: Conjunctivae normal.  ?   Pupils: Pupils are equal, round, and reactive to light.  ?Cardiovascular:  ?   Rate and Rhythm: Normal rate and regular rhythm.  ?   Heart sounds: No murmur heard. ?Pulmonary:  ?   Effort: Pulmonary effort is normal. No respiratory distress.  ?   Breath sounds: Normal breath sounds. No wheezing or rales.  ?Abdominal:  ?   General: There is no distension.  ?    Palpations: Abdomen is soft.  ?   Tenderness: There is no abdominal tenderness. There is no guarding or rebound.  ?Musculoskeletal:     ?   General: No tenderness. Normal range of motion.  ?   Cervical back: Normal range of motion and neck supple.  ?   Right lower leg: No edema.  ?   Left lower leg: No edema.  ?Skin: ?   General: Skin is warm and dry.  ?   Capillary Refill: Capillary refill takes less than 2  seconds.  ?   Findings: No erythema or rash.  ?Neurological:  ?   Mental Status: He is alert and oriented to person, place, and time. Mental status is at baseline.  ?   Comments: Resting tremor worse in the left leg and arm  ?Psychiatric:     ?   Mood and Affect: Mood is depressed.     ?   Behavior: Behavior normal.     ?   Thought Content: Thought content normal.  ? ? ?ED Results / Procedures / Treatments   ?Labs ?(all labs ordered are listed, but only abnormal results are displayed) ?Labs Reviewed  ?BASIC METABOLIC PANEL - Abnormal; Notable for the following components:  ?    Result Value  ? Glucose, Bld 101 (*)   ? All other components within normal limits  ?BRAIN NATRIURETIC PEPTIDE - Abnormal; Notable for the following components:  ? B Natriuretic Peptide 253.8 (*)   ? All other components within normal limits  ?URINALYSIS, ROUTINE W REFLEX MICROSCOPIC - Abnormal; Notable for the following components:  ? Color, Urine STRAW (*)   ? All other components within normal limits  ?RESP PANEL BY RT-PCR (FLU A&B, COVID) ARPGX2  ?CBC WITH DIFFERENTIAL/PLATELET  ?TROPONIN I (HIGH SENSITIVITY)  ?TROPONIN I (HIGH SENSITIVITY)  ? ? ?EKG ?EKG Interpretation ? ?Date/Time:  Friday October 22 2021 21:03:51 EDT ?Ventricular Rate:  87 ?PR Interval:  182 ?QRS Duration: 106 ?QT Interval:  346 ?QTC Calculation: 416 ?R Axis:   101 ?Text Interpretation: Normal sinus rhythm Rightward axis Septal infarct , age undetermined ST & T wave abnormality, consider inferior ischemia Intraventricular conduction delay No significant change since  Dec 2019 When compared with ECG of 30-Mar-2020 17:38, PREVIOUS ECG IS PRESENT Confirmed by Blanchie Dessert [phone]) on 10/23/2021 7:21:51 AM ? ?Radiology ?DG Chest 2 View ? ?Result Date: 10/22/2021 ?CLINICAL DATA:  [place]

## 2021-10-23 NOTE — Discharge Instructions (Signed)
It was a pleasure to see you today.  The lab work today looks good and no sign of heart or lung problems.  It seems that allergies is the problem.  You were given a steroid here that will last 3 days and will help with the congestion but you were also given a prescription for allergy nasal spray and pills.  Do not take dramamine with these medications and make sure you are staying hydrated.  Folllow up with your doctor if it is not helping and try to stay away from the pollen when the counts are high. ?

## 2021-10-24 NOTE — Patient Instructions (Signed)
Mr. Brunsman ?It was a pleasure speaking with you  ?Below is a summary of your health goals and care plan ? ?Patient Goals/Self-Care Activities ?Focus on medication adherence by filling medications appropriately. Set alarm on phone to remind you to take medications.  ?Bring medications to upcoming appointment 10/25/2021.  ?Take medications as prescribed - EVERY day ?Report any questions or concerns to PharmD and/or provider(s). Is it important for your medical team to be aware if you suspect you are having side effects or taking medications differently.  ?Weigh daily, and contact provider if weight gain of of more than 3 lbs in 24 hours or 5 lbs in 1 week ? ?If you have any questions or concerns, please feel free to contact me either at the phone number below or with a MyChart message.  ? ?Keep up the good work! ? ?[name], PharmD ?Clinical Pharmacist ?[facility] ?[place] ?[phone] (direct line)  ?[phone] (main office number) ? ? ?Chronic Care Management Care Plan ? ?Parkinson's Disease:  ?Goal: decrease symptoms while minimizing medication side effects ?Managed by Dr Wells Guiles [name]  ?Currently therapy:  ?Rytary 36.25/'145mg'$  - take 1 tablet 3 times a day ?Interventions:  ?Recommended yearly skin checks with dermatology due to risk of melanoma with Parkinson's Diseases / treatment. Patient is agreeable to dermatology referral.  ?Continue physical thearpy ? ?Hypertension / congestive heart failure: ?Goal: blood pressure < 140/90  ?Goal for congestive heart failure:  management of symptoms while preventing exacerbations / hospitalizations ?Current treatment: ?Furosemide '40mg'$  -  take 1 tablet 3 times per week ?Metoprolol tartrate '25mg'$  - take 1 tablet daily  ?Entresto 24/'26mg'$  - take 1 tablet daily   ?Interventions:  ?Signs and symptoms of congestive heart failure exacerbation - weight gain, shortness of breath, abdominal fullness, swelling in legs or abdomen, Fatigue and weakness, changes  in ability to perform usual activities, persistent cough or wheezing with white or pink blood-tinged mucus, nausea and lack of appetite ?Recommend checking weight daily - report weight gain of more than 3 lbs in 24 hours or 5 lbs in 1 week. ?Continue current regimen for hypertension and congestive heart failure ?Continue to follow up with Dr [name], cardiologist ? ?Hyperlipidemia / heart disease: ?LDL improved but not at goal of < 70; Triglycerides at goal of < 150 ? ?Lab Results  ?Component Value Date  ? Aurelia 96 09/27/2021  ? LDLCALC 125 (H) 03/25/2021  ? LDLCALC 125 (H) 11/18/2020  ? ?Lab Results  ?Component Value Date  ? TRIG 120 09/27/2021  ? TRIG 174 (H) 03/25/2021  ? TRIG 148 11/18/2020  ? ?Current treatment: ?Aspirin '81mg'$  daily  ?Pravastatin '20mg'$  daily  ?Interventions:  ?Make sure to take pravastatin and aspirin EVERY day.  ?Restart pravastatin  ? ?Depression ?Current treatment: ?Trintellix '5mg'$  once a day ?Interventions:  ?Reviewed importance of taking Trintellix every day.  ?Continue to have regular visits at with Missy at Dr Doristine Devoid office ? ?Medication management ?Pharmacist Clinical Goal(s): ?Over the next 90 days, patient will work with PharmD and providers to maintain optimal medication adherence ?Current pharmacy: [facility] ?Interventions ?Comprehensive medication review performed. ?Reviewed refill history and assessed adherence ?Take all medications as prescribed and to use adherence packaging just as it is set up.  ?Communicate medication problems or suspected side effect to either the clinical pharmacist or a medical provider so that adjustments can be made.   ?  ? ?Follow Up Plan: Pharmacist will review medications in person when patient is in to see  PCP 10/25/2021  ? ?The patient verbalized understanding of instructions, educational materials, and care plan provided today and agreed to receive a mailed copy of patient instructions, educational materials, and care plan.   ?

## 2021-10-25 ENCOUNTER — Ambulatory Visit: Payer: Medicare Other | Admitting: Pharmacist

## 2021-10-25 ENCOUNTER — Encounter: Payer: Medicare Other | Admitting: Family Medicine

## 2021-10-25 DIAGNOSIS — G20 Parkinson's disease: Secondary | ICD-10-CM

## 2021-10-25 DIAGNOSIS — Z79899 Other long term (current) drug therapy: Secondary | ICD-10-CM

## 2021-10-25 DIAGNOSIS — E785 Hyperlipidemia, unspecified: Secondary | ICD-10-CM

## 2021-10-25 DIAGNOSIS — F339 Major depressive disorder, recurrent, unspecified: Secondary | ICD-10-CM

## 2021-10-25 DIAGNOSIS — I2510 Atherosclerotic heart disease of native coronary artery without angina pectoris: Secondary | ICD-10-CM

## 2021-10-25 DIAGNOSIS — E782 Mixed hyperlipidemia: Secondary | ICD-10-CM

## 2021-10-25 NOTE — Chronic Care Management (AMB) (Signed)
? ? ?Chronic Care Management ?Pharmacy Note ? ?10/25/2021 ?Name:  Shawn Meza MRN:  [id number] DOB:  1945/08/29 ? ?Summary: ?Patient requested follow up with ophthalmologist (he would like to see different ophthalmologist than in past, he it looks like he saw [facility]) ?Recommended yearly skin checks with dermatologist due to PD disease / treatment. Patient is agreeable to dermatology referral.  ?Patient reported that he has not received Rx from 10/23/2021 for cetirizine yet. Pharmacy in [place] did not have cetirizine 37m tablets only liquid which patient did not want.  ? ?Plan: ?Patient will bring in medications with him to upcoming appointment, 10/27/21 with PCP.  ?Referral to dermatology and ophthalmology if approved by PCP - patient will see Dr WNani Ravens  ?Recommended non drowsy antihistamine like loratadine / Claritin. Patient can get 165mloratadine for $2.20 #100 at [facility] ?Patient got time of PCP appointment mixed up today. Printed al appointments and reviewed with patient.  ? ? ?Subjective: ?HaNAFTOLI PENNYs an [age].o. year old male who is a primary patient of WeShelda PalDO.  The CCM team was consulted for assistance with disease management and care coordination needs.   ? ?Engaged with patient face to face for follow up visit in response to provider referral for pharmacy case management and/or care coordination services.  ? ?Consent to Services:  ?The patient was given information about Chronic Care Management services, agreed to services, and gave verbal consent prior to initiation of services.  Please see initial visit note for detailed documentation.  ? ?Patient Care Team: ?WeShelda PalDO as PCP - General (Family Medicine) ?MuRichardo PriestMD as PCP - Cardiology (Cardiology) ?GrMichael BostonMD (General Surgery) ?MoPollyann SamplesMD as Consulting Physician (General Surgery) ?TaLudwig ClarksDO as Consulting Physician (Neurology) ?WaLuretha RuedRN as Case Manager ?EcCherre RobinsRPH-CPP (Pharmacist) ? ?Recent office visits: ?08/13/21-Nicholas PaNolene EbbsDO (PCP, Video visit) Seen for back pain. Start Voltaren 1% gel and Tizanidine 4 mg. Follow up in 1 month. ?  ?Recent consult visits:  ?10/19/2021 - Neuro / LCSW - phone call. Started physical therapy. Has not started recommended dance classes. Mood reported to have improved.  ?10/12/2021 - Neuro / LCSW (TUlyses JarredSeen for counseling. ?10/08/2021 - Neuro (Dr [name]) F/U Parkinson's Disease. Given samples of Rytary 14564mo take 3 times a day. [facility]. [facility].  ?09/27/21-Brian J. Mora BellmanD (Cardiology) Cardiac follow up visit. Follow up in 6 months. ?09/16/21-Jeremy E. Hinton DyerD (Sports medicine) Seen for acute on chronic ride sided radicular pain. ?08/23/21-Misty A. Taylor-Paladino, LCSW. (Neurology, Video visit) Seen for depression. Follow up in 4 weeks. ?04/29/21-Dave S. Eichman (Anesthesiology) Notes not available.  ?04/08/2022 - Neurology (Dr [name]) Seen for f/u Parkinson's Disease. Continue carbidopa/levodopa 25/100 CR, 2 tablets at 8 AM/2 tablets at noon/1 tablet at 4 PM. Continue carbidopa/levodopa 50/200 CR at bedtime.  Depression -On Trintellix.  Managed by primary care.  Declines psychiatry.  I still think that this is a big issue in patient's life. B12 deficiency -On supplementation; Lumbar spinal stenosis -Following with Dr. SteVertell LimberPhone number was given to patient to contact for steroid injections;  Insomnia -Continue melatonin to 3 mg. ?  ?Hospital visits:  ?[facility]  ?[facility] ?predniSONE (DELTASONE) 20 MG tablet  Daily      ?Lidocaine 4% patch every 12 hours  prn ?Medication Changes at Hospital Discharge: ?None noted ?Medications Discontinued at Hospital Discharge: ?None noted ? ? ?Objective: ? ?Lab Results  ?Component Value Date   ? CREATININE 0.95 10/22/2021  ? CREATININE 0.98 09/27/2021  ? CREATININE 0.93 03/25/2021  ? ? ?Lab Results  ?Component Value Date  ? HGBA1C 5.7 (H) 12/19/2018  ? ?Last diabetic Eye exam: No results found for: HMDIABEYEEXA  ?Last diabetic Foot exam: No results found for: HMDIABFOOTEX  ? ?   ?Component Value Date/Time  ? CHOL 160 09/27/2021 1440  ? TRIG 120 09/27/2021 1440  ? TRIG 107 05/09/2010 0000  ? HDL 42 09/27/2021 1440  ? CHOLHDL 3.8 09/27/2021 1440  ? CHOLHDL 3.6 05/20/2015 1410  ? VLDL 27 05/20/2015 1410  ? LDLCALC 96 09/27/2021 1440  ? ? ? ?  Latest Ref Rng & Units 09/27/2021  ?  2:40 PM 03/25/2021  ?  4:44 PM 08/11/2020  ?  2:19 PM  ?Hepatic Function  ?Total Protein 6.0 - 8.5 g/dL 6.7   6.8   6.9    ?Albumin 3.7 - 4.7 g/dL 4.4   4.2   4.1    ?AST 0 - 40 IU/L _0 ?ALT 0 - 44 IU/L _1 ?Alk Phosphatase 44 - 121 IU/L 72   71   69    ?Total Bilirubin 0.0 - 1.2 mg/dL 0.4   0.3   0.5    ? ? ?Lab Results  ?Component Value Date/Time  ? TSH 1.73 01/22/2020 03:28 PM  ? TSH 2.010 12/19/2018 11:23 AM  ? ? ? ?  Latest Ref Rng & Units 10/22/2021  ?  9:23 PM 03/30/2020  ?  4:02 PM 03/30/2020  ?  3:54 PM  ?CBC  ?WBC 4.0 - 10.5 K/uL 9.0    9.6    ?Hemoglobin 13.0 - 17.0 g/dL 16.2   15.6   15.6    ?Hematocrit 39.0 - 52.0 % 48.4   46.0   48.3    ?Platelets 150 - 400 K/uL 202    189    ? ? ?Lab Results  ?Component Value Date/Time  ? VD25OH 28.37 (L) 07/28/2020 01:34 PM  ? VD25OH 19.92 (L) 01/22/2020 03:28 PM  ? ? ?Clinical ASCVD: Yes  ?The ASCVD Risk score (Arnett DK, et al., 2019) failed to calculate for the following reasons: ?  The patient has a prior MI or stroke diagnosis   ? ? ? ?Social History  ? ?Tobacco Use  ?Smoking Status Never  ? Passive exposure: Never  ?Smokeless Tobacco Never  ? ?BP Readings from Last 3 Encounters:  ?10/23/21 (!) 157/100  ?10/08/21 126/83  ?09/27/21 120/60  ? ?Pulse Readings from Last 3 Encounters:  ?10/23/21 93  ?10/08/21 72  ?09/27/21 92  ? ?Wt Readings from Last 3  Encounters:  ?10/22/21 206 lb 9.1 oz (93.7 kg)  ?10/08/21 206 lb 9.6 oz (93.7 kg)  ?09/27/21 205 lb (93 kg)  ? ? ?Assessment: Review of patient past medical history, allergies, medications, health status, including review of consultants reports, laboratory and other test data, was performed as part of comprehensive evaluation and provision of chronic care management services.  ? ?SDOH:  (Social Determinants of Health) assessments and interventions performed:  ? ? ?CCM Care Plan ? ?Allergies  ?Allergen Reactions  ? Rosuvastatin Other (See Comments)  ?  Whole body aches  ? Testosterone Other (See Comments)  ?  ABDOMINAL  PAIN and cramping  ? Fluoxetine Other (See Comments)  ?  Caused depression and aggression  ? Requip [Ropinirole] Nausea Only  ? Tizanidine Hcl Hives  ? ? ?Medications Reviewed Today   ? ? Reviewed by [name], RPH-CPP (Pharmacist) on 10/22/21 at 23  Med List Status: <None>  ? ?Medication Order Taking? Sig Documenting Provider Last Dose Status Informant  ?aspirin 81 MG EC tablet [id number] Yes Take 1 tablet (81 mg total) by mouth daily. [name], DO Taking Active   ?Carbidopa-Levodopa ER (RYTARY) 36.25-145 MG CPCR [id number] No Samples of this drug were given to the patient, quantity 2, Lot Number [id number] A Exp 08/23  ?Patient not taking: Reported on 10/22/2021  ? Ludwig Clarks, DO Not Taking Active   ?diclofenac Sodium (VOLTAREN) 1 % GEL [id number] No Apply 2 g topically 4 (four) times daily.  ?Patient not taking: Reported on 09/27/2021  ? [name], DO Not Taking Active   ?esomeprazole (NEXIUM) 40 MG capsule [id number] Yes Take 1 capsule (40 mg total) by mouth at bedtime. [name], DO Taking Active   ?FLOVENT HFA 110 MCG/ACT inhaler [id number] Yes Inhale 1 puff into the lungs daily as needed (wheezing, shortness of breath). [name], DO Taking Active   ?furosemide (LASIX) 40 MG tablet [id number] Yes Take 1 tablet (40 mg total) by mouth 3  (three) times a week.  ?Patient taking differently: Take 20 mg by mouth 3 (three) times a week.  ? Richardo Priest, MD Taking Active   ?gabapentin (NEURONTIN) 100 MG capsule [id number] No Take 1 capsule (100 mg total) b

## 2021-10-26 DIAGNOSIS — R260 Ataxic gait: Secondary | ICD-10-CM | POA: Diagnosis not present

## 2021-10-26 DIAGNOSIS — M4726 Other spondylosis with radiculopathy, lumbar region: Secondary | ICD-10-CM | POA: Diagnosis not present

## 2021-10-26 DIAGNOSIS — M79604 Pain in right leg: Secondary | ICD-10-CM | POA: Diagnosis not present

## 2021-10-26 DIAGNOSIS — G20 Parkinson's disease: Secondary | ICD-10-CM | POA: Diagnosis not present

## 2021-10-27 ENCOUNTER — Ambulatory Visit (INDEPENDENT_AMBULATORY_CARE_PROVIDER_SITE_OTHER): Payer: Medicare Other | Admitting: Family Medicine

## 2021-10-27 ENCOUNTER — Encounter: Payer: Self-pay | Admitting: Family Medicine

## 2021-10-27 VITALS — BP 122/80 | HR 79 | Temp 98.2°F | Ht 66.0 in | Wt 206.1 lb

## 2021-10-27 DIAGNOSIS — M7061 Trochanteric bursitis, right hip: Secondary | ICD-10-CM | POA: Diagnosis not present

## 2021-10-27 DIAGNOSIS — G20 Parkinson's disease: Secondary | ICD-10-CM

## 2021-10-27 DIAGNOSIS — H547 Unspecified visual loss: Secondary | ICD-10-CM | POA: Diagnosis not present

## 2021-10-27 DIAGNOSIS — E7800 Pure hypercholesterolemia, unspecified: Secondary | ICD-10-CM

## 2021-10-27 DIAGNOSIS — Z0000 Encounter for general adult medical examination without abnormal findings: Secondary | ICD-10-CM

## 2021-10-27 DIAGNOSIS — G20A1 Parkinson's disease without dyskinesia, without mention of fluctuations: Secondary | ICD-10-CM

## 2021-10-27 MED ORDER — METHYLPREDNISOLONE ACETATE 40 MG/ML IJ SUSP
40.0000 mg | Freq: Once | INTRAMUSCULAR | Status: AC
  Administered 2021-10-27: 40 mg via INTRA_ARTICULAR

## 2021-10-27 NOTE — Patient Instructions (Addendum)
If you do not hear anything about your referrals in the next 1-2 weeks, call our office and ask for an update. ? ?Keep the diet clean and stay active. ? ?Please get me a copy of your advanced directive form at your convenience.  ? ?Let us know if you need anything. ? ?Iliotibial Band Syndrome Rehab ?It is normal to feel mild stretching, pulling, tightness, or discomfort as you do these exercises, but you should stop right away if you feel sudden pain or your pain gets worse.  ?Stretching and range of motion exercises ?These exercises warm up your muscles and joints and improve the movement and flexibility of your hip and pelvis. ?Exercise A: Quadriceps, prone ? ?  ?Lie on your abdomen on a firm surface, such as a bed or padded floor. ?Bend your left / right knee and hold your ankle. If you cannot reach your ankle or pant leg, loop a belt around your foot and grab the belt instead. ?Gently pull your heel toward your buttocks. Your knee should not slide out to the side. You should feel a stretch in the front of your thigh and knee. ?Hold this position for 30 seconds. ?Repeat 2 times. Complete this stretch 3 times per week. ?Exercise B: Iliotibial band ? ?  ?Lie on your side with your left / right leg in the top position. ?Bend both of your knees and grab your left / right ankle. Stretch out your bottom arm to help you balance. ?Slowly bring your top knee back so your thigh goes behind your trunk. ?Slowly lower your top leg toward the floor until you feel a gentle stretch on the outside of your left / right hip and thigh. If you do not feel a stretch and your knee will not fall farther, place the heel of your other foot on top of your knee and pull your knee down toward the floor with your foot. ?Hold this position for 30 seconds. ?Repeat 2 times. Complete this stretch 3 times per week. ?Strengthening exercises ?These exercises build strength and endurance in your hip and pelvis. Endurance is the ability to use your  muscles for a long time, even after they get tired. ?Exercise C: Straight leg raises (hip abductors)  ? ?  ?Lie on your side with your left / right leg in the top position. Lie so your head, shoulder, knee, and hip line up. You may bend your bottom knee to help you balance. ?Roll your hips slightly forward so your hips are stacked directly over each other and your left / right knee is facing forward. ?Tense the muscles in your outer thigh and lift your top leg 4-6 inches (10-15 cm). ?Hold this position for 3 seconds. Repeat for a total of 10 reps. ?Slowly return to the starting position. Let your muscles relax completely before doing another repetition. ?Repeat 2 times. Complete this exercise 3 times per week. ?Exercise D: Straight leg raises (hip extensors) ?Lie on your abdomen on your bed or a firm surface. You can put a pillow under your hips if that is more comfortable. ?Bend your left / right knee so your foot is straight up in the air. ?Squeeze your buttock muscles and lift your left / right thigh off the bed. Do not let your back arch. ?Tense this muscle as hard as you can without increasing any knee pain. ?Hold this position for 2 seconds. Repeat for a total of 10 reps ?Slowly lower your leg to the starting position and  allow it to relax completely. ?Repeat 2 times. Complete this exercise 3 times per week. ?Exercise E: Hip hike ?Stand sideways on a bottom step. Stand on your left / right leg with your other foot unsupported next to the step. You can hold onto the railing or wall if needed for balance. ?Keep your knees straight and your torso square. Then, lift your left / right hip up toward the ceiling. ?Slowly let your left / right hip lower toward the floor, past the starting position. Your foot should get closer to the floor. Do not lean or bend your knees. ?Repeat 2 times. Complete this exercise 3 times per week. ? ?Document Released: 07/18/2005 Document Revised: 03/22/2016 Document Reviewed:  06/19/2015 ?Elsevier Interactive Patient Education ? 2018 [place]. ?  ? ?

## 2021-10-27 NOTE — Progress Notes (Signed)
Chief Complaint  ?Patient presents with  ? Annual Exam  ? ? ?Well Male ?Shawn Meza is here for a complete physical.   ?His last physical was >1 year ago.  ?Current diet: in general, a "healthy" diet.   ?Current exercise: some walking ?Weight trend: stable ?Fatigue out of ordinary? No. ?Seat belt? Yes.   ?Advanced directive?  Unsure ? ?Health maintenance ?Shingrix- No ?Tetanus- Due; ins won't cover ?Hep C- No ?Pneumonia vaccine- Yes ? ?R hip pain- worse over past 1 mo since falling after being pulled over by dog. Sharp/achy pain. Not improving. Seeing PT. No bruising, redness, swelling, catching/locking.  ? ?Past Medical History:  ?Diagnosis Date  ? Arthritis   ? BPH (benign prostatic hypertrophy)   ? Chronic coronary artery disease   ? Colon cancer ([place])   ? 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 Diarrhea 11/04/2020  ? Elevated PSA   ? Erectile dysfunction   ? Essential hypertension   ? Essential tremor   ? GERD (gastroesophageal reflux disease)   ? Headache(784.0)   ? Hiatal hernia   ? Hypercholesterolemia   ? Long-term use of aspirin therapy   ? Low back pain 10/28/2019  ? Major depression, chronic   ? Medial meniscus tear 10/11/2011  ? Metabolic syndrome   ? Morbid obesity ([place])   ? Myofascial pain 12/20/2019  ? Nephrolithiasis   ? hx of  ? NSTEMI (non-ST elevated myocardial infarction) ([place])   ? Parkinson's disease ([place])   ? S/P CABG (coronary artery bypass graft)   ? Transient ischemic attack   ? hx of  ? Trochanteric bursitis of right hip   ?  ? ?Past Surgical History:  ?Procedure Laterality Date  ? CARDIAC CATHETERIZATION  5/12,1/13  ? 4 stents placed  ? COLON SURGERY    ? CORONARY ARTERY BYPASS GRAFT    ? KNEE ARTHROSCOPY  10/11/2011  ? Procedure: ARTHROSCOPY KNEE;  Surgeon: Lorn Junes, MD;  Location: [facility];  Service: Orthopedics;  Laterality: Left;  Left Knee Arthroscopy with Medial and Lateral Partial Menisectomy, Chondroplasty  ? LEFT HEART CATHETERIZATION WITH CORONARY  ANGIOGRAM N/A 08/25/2011  ? Procedure: LEFT HEART CATHETERIZATION WITH CORONARY ANGIOGRAM;  Surgeon: Burnell Blanks, MD;  Location: [facility] CATH LAB;  Service: Cardiovascular;  Laterality: N/A;  ? LITHOTRIPSY    ? STERIOD INJECTION  10/11/2011  ? Procedure: STEROID INJECTION;  Surgeon: Lorn Junes, MD;  Location: [facility];  Service: Orthopedics;  Laterality: Right;  Steroid Injection Second Toe  ? TRANSURETHRAL RESECTION OF PROSTATE    ? URETHRAL DILATION    ? ? ?Medications  ?Current Outpatient Medications on File Prior to Visit  ?Medication Sig Dispense Refill  ? aspirin 81 MG EC tablet Take 1 tablet (81 mg total) by mouth daily. 90 tablet 3  ? esomeprazole (NEXIUM) 40 MG capsule Take 1 capsule (40 mg total) by mouth at bedtime. 90 capsule 3  ? FLOVENT HFA 110 MCG/ACT inhaler Inhale 1 puff into the lungs daily as needed (wheezing, shortness of breath). 1 each 3  ? fluticasone (FLONASE) 50 MCG/ACT nasal spray Place 1 spray into both nostrils daily. 1 g 0  ? furosemide (LASIX) 40 MG tablet Take 1 tablet (40 mg total) by mouth 3 (three) times a week. (Patient taking differently: Take 20 mg by mouth 3 (three) times a week.) 40 tablet 3  ? gabapentin (NEURONTIN) 100 MG capsule Take 1 capsule (100 mg total) by mouth at bedtime. (  Patient not taking: Reported on 10/04/2021) 180 capsule 1  ? metoprolol tartrate (LOPRESSOR) 25 MG tablet Take 1 tablet (25 mg total) by mouth daily. 90 tablet 3  ? nitroGLYCERIN (NITROSTAT) 0.4 MG SL tablet Place 1 tablet (0.4 mg total) under the tongue every 5 (five) minutes x 3 doses as needed for chest pain. 25 tablet 3  ? pravastatin (PRAVACHOL) 20 MG tablet Take 1 tablet (20 mg total) by mouth daily. (Patient not taking: Reported on 10/14/2021) 90 tablet 1  ? prednisoLONE acetate (PRED FORTE) 1 % ophthalmic suspension Place 1 drop into both eyes 2 (two) times daily. (Patient not taking: Reported on 10/04/2021)    ? sacubitril-valsartan (ENTRESTO) 24-26 MG Take 1 tablet by  mouth daily. 90 tablet 3  ? tiZANidine (ZANAFLEX) 4 MG tablet Take 1 tablet (4 mg total) by mouth every 6 (six) hours as needed for muscle spasms. 30 tablet 0  ? vortioxetine HBr (TRINTELLIX) 5 MG TABS tablet Take 1 tablet (5 mg total) by mouth daily. (Patient taking differently: Take 2.5 mg by mouth daily.) 90 tablet 2  ? ? ?Allergies ?Allergies  ?Allergen Reactions  ? Rosuvastatin Other (See Comments)  ?  Whole body aches  ? Testosterone Other (See Comments)  ?  ABDOMINAL PAIN and cramping  ? Fluoxetine Other (See Comments)  ?  Caused depression and aggression  ? Requip [Ropinirole] Nausea Only  ? Tizanidine Hcl Hives  ? ? ?Family History ?Family History  ?Problem Relation Age of Onset  ? Heart disease Mother   ? Heart disease Father   ? Hyperlipidemia Father   ? Stroke Father   ? Hyperlipidemia Brother   ? Heart disease Brother   ? Coronary artery disease Other   ?     family hx of male 1st degree relative ,80  ? Hyperlipidemia Other   ?     family hx of  ? Hypertension Other   ?     family hx of  ? Arthritis Other   ?     family hx of  ? Healthy Daughter   ? Dementia Neg Hx   ? ? ?Review of Systems: ?Constitutional:  no fevers ?Eye:  steadily worsening vision ?Ears:  No changes in hearing ?Nose/Mouth/Throat:  no complaints of nasal congestion, no sore throat ?Cardiovascular: no chest pain ?Respiratory:  No shortness of breath ?Gastrointestinal:  No change in bowel habits ?GU:  No frequency ?Integumentary:  no abnormal skin lesions reported ?Neurologic:  no headaches ?Endocrine:  denies unexplained weight changes ? ?Exam ?BP 122/80   Pulse 79   Temp 98.2 ?F (36.8 ?C) (Oral)   Ht '5\' 6"'$  (1.676 m)   Wt 206 lb 2 oz (93.5 kg)   SpO2 98%   BMI 33.27 kg/m?  ?General:  well developed, well nourished, in no apparent distress ?Skin:  no significant moles, warts, or growths ?Head:  no masses, lesions, or tenderness ?Eyes:  pupils equal and round, sclera anicteric without injection ?Ears:  canals without lesions, TMs  shiny without retraction, no obvious effusion, no erythema ?Nose:  nares patent, septum midline, mucosa normal ?Throat/Pharynx:  lips and gingiva without lesion; tongue and uvula midline; non-inflamed pharynx; no exudates or postnasal drainage ?Lungs:  clear to auscultation, breath sounds equal bilaterally, no respiratory distress ?Cardio:  regular rate and rhythm, no LE edema or bruits ?Rectal: Deferred ?GI: BS+, S, NT, ND, no masses or organomegaly ?Musculoskeletal: +ttp over R greater trochanter, no pain w resisted abduction symmetrical muscle groups noted  without atrophy or deformity ?Neuro:  +Resting tremor in UE's, worse on LUE; gait normal; deep tendon reflexes normal and symmetric ?Psych: well oriented with normal range of affect and appropriate judgment/insight ? ?Procedure note: Greater trochanteric bursa injection ?Verbal consent obtained. ?The area of interest was palpated and demarcated with an otoscope speculum. ?It was cleaned with an alcohol swab. ?Freeze spray was used. ?A 25 g needle was inserted at a perpendicular angle through the area of interested. The plunger was withdrawn to ensure our placement was not in a vessel. 2 mL of 1% lidocaine without epi and 40 mg of Depomedrol was injected. ?A bandaid was placed. ?The patient tolerated the procedure well.  ?There were no complications noted.  ? ?Assessment and Plan ? ?Well adult exam ? ?Hypercholesterolemia ? ?Parkinson's disease ([place]) - Plan: Ambulatory referral to Dermatology ? ?Worsening vision - Plan: Ambulatory referral to Ophthalmology  ? ?Well 76 y.o. male. ?Counseled on diet and exercise. ?Other orders as above. ?Advanced directive form provided today.  ?Will ck Hep C Ab w next lab work ?Bursitis: Injection today. IT band stretches/exercises. Ice, Tylenol, is going to PT already.  ?Follow up in 6 mo or prn.  ?The patient voiced understanding and agreement to the plan. ? ?[name], DO ?10/27/21 ?1:21 PM ? ?

## 2021-10-28 ENCOUNTER — Other Ambulatory Visit: Payer: Self-pay | Admitting: Neurology

## 2021-10-28 ENCOUNTER — Telehealth: Payer: Self-pay | Admitting: Family Medicine

## 2021-10-28 DIAGNOSIS — M79604 Pain in right leg: Secondary | ICD-10-CM | POA: Diagnosis not present

## 2021-10-28 DIAGNOSIS — G20 Parkinson's disease: Secondary | ICD-10-CM | POA: Diagnosis not present

## 2021-10-28 DIAGNOSIS — M4726 Other spondylosis with radiculopathy, lumbar region: Secondary | ICD-10-CM | POA: Diagnosis not present

## 2021-10-28 DIAGNOSIS — R260 Ataxic gait: Secondary | ICD-10-CM | POA: Diagnosis not present

## 2021-10-28 NOTE — Telephone Encounter (Signed)
[facility] wanted to let referral coordinator know that they do not take the patient's insurance so they will not be able to see him.  ?

## 2021-10-29 DIAGNOSIS — F339 Major depressive disorder, recurrent, unspecified: Secondary | ICD-10-CM

## 2021-10-29 DIAGNOSIS — I2510 Atherosclerotic heart disease of native coronary artery without angina pectoris: Secondary | ICD-10-CM

## 2021-10-29 DIAGNOSIS — E782 Mixed hyperlipidemia: Secondary | ICD-10-CM

## 2021-10-29 DIAGNOSIS — I255 Ischemic cardiomyopathy: Secondary | ICD-10-CM

## 2021-10-29 DIAGNOSIS — I2584 Coronary atherosclerosis due to calcified coronary lesion: Secondary | ICD-10-CM | POA: Diagnosis not present

## 2021-10-29 DIAGNOSIS — I2583 Coronary atherosclerosis due to lipid rich plaque: Secondary | ICD-10-CM

## 2021-10-29 DIAGNOSIS — E785 Hyperlipidemia, unspecified: Secondary | ICD-10-CM

## 2021-11-02 ENCOUNTER — Telehealth: Payer: Self-pay

## 2021-11-02 ENCOUNTER — Other Ambulatory Visit: Payer: Self-pay

## 2021-11-02 DIAGNOSIS — G20 Parkinson's disease: Secondary | ICD-10-CM | POA: Diagnosis not present

## 2021-11-02 DIAGNOSIS — M79604 Pain in right leg: Secondary | ICD-10-CM | POA: Diagnosis not present

## 2021-11-02 DIAGNOSIS — R260 Ataxic gait: Secondary | ICD-10-CM | POA: Diagnosis not present

## 2021-11-02 DIAGNOSIS — M4726 Other spondylosis with radiculopathy, lumbar region: Secondary | ICD-10-CM | POA: Diagnosis not present

## 2021-11-02 NOTE — Telephone Encounter (Signed)
[facility] called wanting prescription for this patient. I did let tem know we have given him samples hoping he could tolerate this medication . Call Mr. Guedes and ask how he is doing on this medication? ?

## 2021-11-03 ENCOUNTER — Ambulatory Visit: Payer: Medicare Other | Admitting: Licensed Clinical Social Worker

## 2021-11-03 ENCOUNTER — Other Ambulatory Visit: Payer: Self-pay

## 2021-11-03 DIAGNOSIS — F330 Major depressive disorder, recurrent, mild: Secondary | ICD-10-CM

## 2021-11-03 NOTE — Telephone Encounter (Signed)
Patient may not want to use [facility] he is thinking about it and going to let me know  ?

## 2021-11-03 NOTE — Telephone Encounter (Signed)
Called patient phone stopped ringing but no voicemail  ?

## 2021-11-04 ENCOUNTER — Telehealth: Payer: Self-pay | Admitting: Neurology

## 2021-11-04 DIAGNOSIS — R260 Ataxic gait: Secondary | ICD-10-CM | POA: Diagnosis not present

## 2021-11-04 DIAGNOSIS — M4726 Other spondylosis with radiculopathy, lumbar region: Secondary | ICD-10-CM | POA: Diagnosis not present

## 2021-11-04 DIAGNOSIS — G20 Parkinson's disease: Secondary | ICD-10-CM | POA: Diagnosis not present

## 2021-11-04 DIAGNOSIS — M79604 Pain in right leg: Secondary | ICD-10-CM | POA: Diagnosis not present

## 2021-11-04 NOTE — BH Specialist Note (Signed)
Integrated Behavioral Health Follow Up In-Person Visit ? ?MRN: [id number] ?Name: CEZAR MISIASZEK ? ?Number of [place] Clinician visits: 3- Third Visit ? ?Session Start time: 1500 ?  ?Session End time: 1600 ? ?Total time in minutes: 60 ? ? ?Types of Service: Individual psychotherapy ? ?Interpretor:No. Interpretor Name and Language: NA ? ? ?Subjective: ?HERSHY FLENNER is a 76 y.o. male accompanied by  SELF ?Patient was referred by Dr. Wells Guiles [name] for Depression . ?Patient reports the following symptoms/concerns: feelings of loss of energy, isolation, loss of interest hopeless ?Duration of problem: several months; Severity of problem: moderate ? ?Objective: ?Mood: Depressed and Affect: Appropriate ?Risk of harm to self or others: No plan to harm self or others ? ?Life Context: ?Family and Social: Pt resides by himself and is divorced  ?School/Work: Pt is retired and still able to drive  ?Self-Care: Pt is able to complete most ADL's but does have significant medical issues  ?Life Changes: Progress of Parkinson's and other medical issues occurring  ? ?Patient and/or Family's Strengths/Protective Factors: ?Social connections ? ?Goals Addressed: ?Patient will: ? Reduce symptoms of: depression  ? Increase knowledge and/or ability of: coping skills and healthy habits  ? Demonstrate ability to: Increase healthy adjustment to current life circumstances ? ?Progress towards Goals: ?Ongoing ? ?Interventions: ?Interventions utilized:  Motivational Interviewing, Solution-Focused Strategies, Behavioral Activation, and CBT Cognitive Behavioral Therapy ?Standardized Assessments completed: Not Needed ? ?Patient and/or Family Response: Pt was open to interventions provided and had used tools suggestions from last visit with LCSW  ? ?Patient Centered Plan: ?Patient is on the following Treatment Plan(s): increase feelings of zest, engage in more activities to reduce isolation , continue to increase support system and  ways of improving overall life function. ?Assessment: ?Patient currently experiencing Pt was currently having some unresolved issues with ex-wife, and some interpersonal relationships, and isolation that then led to depression  .  ? ?Patient may benefit from We looked at identifying factors that led to his depression and problem solving skills, some distorted thoughts to change those, and ways of decreasing isolation, and increase pleasurable activities. . ? ?Plan: ?Follow up with behavioral health clinician on : As needed  ?Behavioral recommendations: Engage in community activities, to reduce isolation, follow [facility] or [facility] or in home nurse help with medication management.  ?Referral(s): Community Resources:  Discussed above with Parkinson's resources and support as well as medical support and aging support services.  ?"From scale of 1-10, how likely are you to follow plan?": 8 ? ?Cline Draheim A Taylor-Paladino, LCSW ? ? ?

## 2021-11-04 NOTE — Telephone Encounter (Signed)
[facility] called and LM with AN. They need a call back to discuss a medication. State the pharmacy has already spoken to someone at the office previously  ?

## 2021-11-04 NOTE — Telephone Encounter (Signed)
Called pharmacy back and patient has not decided as of yet were he wants the prescription sent  ?

## 2021-11-11 ENCOUNTER — Ambulatory Visit (INDEPENDENT_AMBULATORY_CARE_PROVIDER_SITE_OTHER): Payer: Medicare Other

## 2021-11-11 DIAGNOSIS — G20 Parkinson's disease: Secondary | ICD-10-CM

## 2021-11-11 DIAGNOSIS — F339 Major depressive disorder, recurrent, unspecified: Secondary | ICD-10-CM

## 2021-11-11 NOTE — Chronic Care Management (AMB) (Signed)
?Chronic Care Management  ? ?CCM RN Visit Note ? ?11/11/2021 ?Name: Shawn Meza MRN: [id number] DOB: 1946-05-11 ? ?Subjective: ?Shawn Meza is a 76 y.o. year old male who is a primary care patient of [name], DO. The care management team was consulted for assistance with disease management and care coordination needs.   ? ?Engaged with patient by telephone for follow up visit in response to provider referral for case management and/or care coordination services.  ? ?Consent to Services:  ?The patient was given information about Chronic Care Management services, agreed to services, and gave verbal consent prior to initiation of services.  Please see initial visit note for detailed documentation.  ? ?Patient agreed to services and verbal consent obtained.  ? ?Assessment: Review of patient past medical history, allergies, medications, health status, including review of consultants reports, laboratory and other test data, was performed as part of comprehensive evaluation and provision of chronic care management services.  ? ?SDOH (Social Determinants of Health) assessments and interventions performed:   ? ?CCM Care Plan ? ?Allergies  ?Allergen Reactions  ? Rosuvastatin Other (See Comments)  ?  Whole body aches  ? Testosterone Other (See Comments)  ?  ABDOMINAL PAIN and cramping  ? Fluoxetine Other (See Comments)  ?  Caused depression and aggression  ? Requip [Ropinirole] Nausea Only  ? Tizanidine Hcl Hives  ? ? ?Outpatient Encounter Medications as of 11/11/2021  ?Medication Sig  ? aspirin 81 MG EC tablet Take 1 tablet (81 mg total) by mouth daily.  ? Carbidopa-Levodopa ER (RYTARY) 36.25-145 MG CPCR Samples of this drug were given to the patient, quantity 2, Lot Number [id number] A Exp 08/23  ? esomeprazole (NEXIUM) 40 MG capsule Take 1 capsule (40 mg total) by mouth at bedtime.  ? FLOVENT HFA 110 MCG/ACT inhaler Inhale 1 puff into the lungs daily as needed (wheezing, shortness of breath).  ? fluticasone  (FLONASE) 50 MCG/ACT nasal spray Place 1 spray into both nostrils daily.  ? furosemide (LASIX) 40 MG tablet Take 1 tablet (40 mg total) by mouth 3 (three) times a week. (Patient taking differently: Take 20 mg by mouth 3 (three) times a week.)  ? gabapentin (NEURONTIN) 100 MG capsule Take 1 capsule (100 mg total) by mouth at bedtime.  ? metoprolol tartrate (LOPRESSOR) 25 MG tablet Take 1 tablet (25 mg total) by mouth daily.  ? nitroGLYCERIN (NITROSTAT) 0.4 MG SL tablet Place 1 tablet (0.4 mg total) under the tongue every 5 (five) minutes x 3 doses as needed for chest pain.  ? pravastatin (PRAVACHOL) 20 MG tablet Take 1 tablet (20 mg total) by mouth daily.  ? prednisoLONE acetate (PRED FORTE) 1 % ophthalmic suspension Place 1 drop into both eyes 2 (two) times daily.  ? sacubitril-valsartan (ENTRESTO) 24-26 MG Take 1 tablet by mouth daily.  ? vortioxetine HBr (TRINTELLIX) 5 MG TABS tablet Take 1 tablet (5 mg total) by mouth daily. (Patient taking differently: Take 2.5 mg by mouth daily.)  ? tiZANidine (ZANAFLEX) 4 MG tablet Take 1 tablet (4 mg total) by mouth every 6 (six) hours as needed for muscle spasms. (Patient not taking: Reported on 11/11/2021)  ? ?No facility-administered encounter medications on file as of 11/11/2021.  ? ? ?Patient Active Problem List  ? Diagnosis Date Noted  ? 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 Diarrhea 11/04/2020  ? Rectal bleeding 11/04/2020  ? Trochanteric bursitis of right hip   ? Transient ischemic attack   ? NSTEMI (non-ST elevated myocardial  infarction) ([place])   ? Nephrolithiasis   ? Metabolic syndrome   ? Major depression, chronic   ? Long-term use of aspirin therapy   ? Hypercholesterolemia   ? Hiatal hernia   ? GERD (gastroesophageal reflux disease)   ? Essential tremor   ? Essential hypertension   ? Erectile dysfunction   ? Elevated PSA   ? Colon cancer ([place])   ? Chronic coronary artery disease   ? Arthritis   ? Myofascial pain 12/20/2019  ? Degenerative lumbar spinal stenosis  10/28/2019  ? Low back pain 10/28/2019  ? Radiculopathy, lumbar region 10/28/2019  ? Fatigue 01/15/2019  ? Chronic systolic (congestive) heart failure ([place]) 09/18/2018  ? Ischemic cardiomyopathy 09/16/2018  ? Aortic regurgitation 09/16/2018  ? Cardiomyopathy, unspecified ([place]) 06/13/2018  ? Abscess of right axilla 12/12/2017  ? BMI 33.0-33.9,adult 12/12/2017  ? 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 Acute blood loss anemia 10/25/2017  ? Acute postoperative respiratory insufficiency 10/25/2017  ? Postoperative delirium 10/25/2017  ? Dyslipidemia 10/17/2017  ? SOB (shortness of breath) 03/31/2017  ? Long term current use of aspirin 03/29/2017  ? Parkinson's disease ([place]) 01/02/2017  ? Memory change 07/06/2015  ? Depression, recurrent ([place]) 07/06/2015  ? Medial meniscus tear 10/11/2011  ? Neuroma of foot 10/11/2011  ? Coronary artery disease involving native coronary artery of native heart with angina pectoris ([place]) 12/28/2010  ? OTHER TESTICULAR HYPOFUNCTION 05/20/2010  ? Mixed hyperlipidemia 05/20/2010  ? 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 05/20/2010  ? GERD 05/20/2010  ? TRANSIENT ISCHEMIC ATTACK, HX OF 05/20/2010  ? NEPHROLITHIASIS, HX OF 05/20/2010  ? BENIGN PROSTATIC HYPERTROPHY, HX OF, S/P TURP 05/20/2010  ? ? ?Conditions to be addressed/monitored:Depression and Parkinson's disease, Chronic pain ? ?Care Plan : RN Case Manager Plan of Care  ?Updates made by Luretha Rued, RN since 11/11/2021 12:00 AM  ?  ? ?Problem: Chronic Disease Managment Education and/or Care Coordination needs   ?Priority: High  ?  ? ?Long-Range Goal: Development of plan of care for Chronic Disease Management and/or care coordination needs   ?Start Date: 09/23/2021  ?Expected End Date: 03/23/2022  ?Priority: High  ?Note:   ?Current Barriers:  ?Knowledge Deficits related to plan of care for management of Chronic Pain and Movement Disorder Parkinson's Disease   ?Care Coordination needs related to Transportation and Medication Reconciliation and Medication management  ?PHQ2 6 PHQ9 -18. Currently being followed by LCSW Althea Charon for Psychotherapeutic services/neurology office  ?Limited social support ? ?10/14/21 Mr. Valone reports some improvement. He reports he has his medications and is taking as prescribed. Except he takes Trintellix 2-3x/week vs daily as prescribed, adding this works better for him. He states he will start physical therapy sessions on Monday. Mr. Midgley reports he went out with a friend for the first time in 6 years and expressed his feelings (anxiety) related to getting out and socializing again. Encouraged Mr. Oxley to continue to work with [employer] ?11/11/21 Mr. Poplaski reports he is attending physical therapy sessions and states the sciatic nerve pain "is a lot better". Reports pain is typically sciatic/back pain and reports "5-6" on the pain scale He continues to attend counseling sessions through neurologist office. Mr. Gilkeson states he would like to have medications changed from packaging to bottles adding the packaging is hard to read; he also has questions regarding next steps for receiving Rytary. He is currently has samples now. He has concerns  regarding cost and if he will be able to afford the medication. ? ?RNCM Clinical Goal(s):  ?Patient will verbalize understanding of plan for management of Chronic Pain, Movement Disorder Parkinson's Disease and Mental Health Concerns as evidenced by self report and/or chart notation ?take all medications exactly as prescribed and will call provider for medication related questions as evidenced by self report and/or chart notation    through collaboration with RN Care manager, provider, and care team.  ? ?Interventions: ?1:1 collaboration with primary care provider regarding development and update of comprehensive plan of care as evidenced by provider attestation and  co-signature ?Inter-disciplinary care team collaboration (see longitudinal plan of care) ?Evaluation of current treatment plan related to  self management and patient's adherence to plan as established by provider ? ?Depression Intervent

## 2021-11-11 NOTE — Patient Instructions (Signed)
Visit Information ? ?Thank you for taking time to visit with me today. Please don't hesitate to contact me if I can be of assistance to you before our next scheduled telephone appointment. ? ?Following are the goals we discussed today:  ?Patient Goals/Self-Care Activities: ?Continue to participate in physical therapy sessions as recommended/scheduled ?Take medications as prescribed   ?Attend all scheduled provider appointments ?Call provider office for new concerns or questions  ?Continue to seek activities outside your home such as: church, senior citizens center, [employer].  ?Contact your nurse case manager for care management and/or care coordination needs ? ?Our next appointment is by telephone on 12/16/21 at 10:00 am ? ?Please call the care guide team at [phone] if you need to cancel or reschedule your appointment.  ? ?If you are experiencing a Mental Health or [place] or need someone to talk to, please call the Suicide and Crisis Lifeline: 988 ?call 1-800-273-TALK (toll free, 24 hour hotline)  ? ?Patient verbalizes understanding of instructions and care plan provided today and agrees to view in [place]. Active MyChart status confirmed with patient.   ? ?Thea Silversmith, RN, MSN, BSN, CCM ?Care Management Coordinator ?[place] ?[phone]  ?

## 2021-11-16 DIAGNOSIS — M4726 Other spondylosis with radiculopathy, lumbar region: Secondary | ICD-10-CM | POA: Diagnosis not present

## 2021-11-16 DIAGNOSIS — R260 Ataxic gait: Secondary | ICD-10-CM | POA: Diagnosis not present

## 2021-11-16 DIAGNOSIS — G20 Parkinson's disease: Secondary | ICD-10-CM | POA: Diagnosis not present

## 2021-11-16 DIAGNOSIS — M79604 Pain in right leg: Secondary | ICD-10-CM | POA: Diagnosis not present

## 2021-11-18 DIAGNOSIS — G20 Parkinson's disease: Secondary | ICD-10-CM | POA: Diagnosis not present

## 2021-11-18 DIAGNOSIS — R260 Ataxic gait: Secondary | ICD-10-CM | POA: Diagnosis not present

## 2021-11-18 DIAGNOSIS — M79604 Pain in right leg: Secondary | ICD-10-CM | POA: Diagnosis not present

## 2021-11-18 DIAGNOSIS — M4726 Other spondylosis with radiculopathy, lumbar region: Secondary | ICD-10-CM | POA: Diagnosis not present

## 2021-11-19 ENCOUNTER — Telehealth: Payer: Self-pay | Admitting: Pharmacist

## 2021-11-19 NOTE — Telephone Encounter (Signed)
Patient would like to get medications in bottles instead of packaging. He feels that it is too difficult to tell what medications he is taking, names and what they are for with the packaging.  ?Contacted [facility] regarding change. Patient will come in 12/06/2021 after his next refill to review medications and we plan to put indication on each bottle.  ?

## 2021-11-22 ENCOUNTER — Other Ambulatory Visit: Payer: Self-pay | Admitting: Cardiology

## 2021-11-22 ENCOUNTER — Other Ambulatory Visit: Payer: Self-pay | Admitting: Family Medicine

## 2021-11-22 MED ORDER — FLOVENT HFA 110 MCG/ACT IN AERO: 1.0000 | INHALATION_SPRAY | Freq: Every day | RESPIRATORY_TRACT | 3 refills | Status: DC | PRN

## 2021-11-25 ENCOUNTER — Ambulatory Visit: Payer: Medicare Other | Admitting: Licensed Clinical Social Worker

## 2021-11-25 DIAGNOSIS — F330 Major depressive disorder, recurrent, mild: Secondary | ICD-10-CM

## 2021-11-26 NOTE — BH Specialist Note (Signed)
Integrated Behavioral Health Follow Up In-Person Visit ? ?MRN: [id number] ?Name: Shawn Meza ? ?Number of [facility] Clinician visits: Additional Visit ? ?Session Start time: 8502 ?  ?Session End time: 7741 ? ?Total time in minutes: 20 ? ? ?Types of Service: Individual psychotherapy ? ?Interpretor:No. Interpretor Name and Language: NA ? ? ?Subjective: ?Shawn Meza is a 76 y.o. male accompanied by  Self ?Patient was referred by Dr. Wells Guiles [name] for Depression . 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 ?Objective: ?Mood: NA and Affect: Appropriate ?Risk of harm to self or others: No plan to harm self or others ? ?Life Context: ?Family and Social: Pt is divorced and off and on has been taking care of exwife ?School/Work: Pt is retired  ?Self-Care: Pt does need assistance at times with ADL's  ?Life Changes: Pt has significant medical issues and has had issues with his back and pain management  ? ?Patient and/or Family's Strengths/Protective Factors: ?Social connections ? ?Goals Addressed: ?Patient will: ? Reduce symptoms of: depression and stress  ? Increase knowledge and/or ability of: coping skills and stress reduction  ? Demonstrate ability to: Increase healthy adjustment to current life circumstances and Increase adequate support systems for patient/family ? ?Progress towards Goals: ?Ongoing ? ?Interventions: ?Interventions utilized:  Solution-Focused Strategies and Link to [employer] ?Standardized Assessments completed: Not Needed ? ?Patient and/or Family Response: Pt voices being open but at times has difficulty following through with resources provided, but does appear enjoy talking through feelings associated with his mood .   ? ?Patient Centered Plan: ?Patient is on the following Treatment Plan(s): Cope with the changes that are happening with Parkinson's and  changes with caregiver role. Increase support and increase knowledge of Alzheimer's  ?Assessment: ?Patient currently experiencing Pt reports feeling overwhelmed conflicted with loving his exwife and wanting to care for her but dealing with the challenges of caring for her   .  ?[name] with the changes that are happening with Parkinson's and changes with caregiver role. Increase support and increase knowledge of Alzheimer's , and find local support group as caregiver.  ? ?Plan: ?Follow up with behavioral health clinician on : In three to four weeks as needed  ?Behavioral recommendations: To follow up with local and online resources  ?Referral(s): Community Resources:  Online resources  ?"From scale of 1-10, how likely are you to follow plan?": 7 ? ? ?Yarielys Beed A Taylor-Paladino, LCSW ? ? ?

## 2021-11-28 DIAGNOSIS — F339 Major depressive disorder, recurrent, unspecified: Secondary | ICD-10-CM

## 2021-11-29 ENCOUNTER — Telehealth: Payer: Self-pay | Admitting: Licensed Clinical Social Worker

## 2021-11-29 NOTE — Telephone Encounter (Signed)
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 Pt said that things were going pretty decently and indicated that he was feeling less lonely now that he is back with his wife, and listed some activities they had been doing together that he was taking joy in. He indicated that being in this caregiver role motivated him to get up and do things, though also mentioned that he had to quit physical therapy because it was leaving him too sore, and the woman that was helping him with it was no longer working at the agency. He said that he may be interested in starting it back up again, but would need a different therapist than before because of how incapacitated it made him feel. Pt expressed frustration with the social security office and governmental system in general because of how difficult it is to follow up with them with Parkinson's, and the student social worker sent him a follow up email with a link to change banking information for social security. The Pt also asked for further information on getting his wife on disability benefits before her birthday in August. Student social worker will send more information on this later in the week. ?

## 2021-12-06 ENCOUNTER — Ambulatory Visit (INDEPENDENT_AMBULATORY_CARE_PROVIDER_SITE_OTHER): Payer: Medicare Other | Admitting: Pharmacist

## 2021-12-06 ENCOUNTER — Telehealth: Payer: Self-pay | Admitting: Neurology

## 2021-12-06 DIAGNOSIS — F339 Major depressive disorder, recurrent, unspecified: Secondary | ICD-10-CM

## 2021-12-06 DIAGNOSIS — G20A1 Parkinson's disease without dyskinesia, without mention of fluctuations: Secondary | ICD-10-CM

## 2021-12-06 DIAGNOSIS — E782 Mixed hyperlipidemia: Secondary | ICD-10-CM

## 2021-12-06 DIAGNOSIS — I2510 Atherosclerotic heart disease of native coronary artery without angina pectoris: Secondary | ICD-10-CM

## 2021-12-06 DIAGNOSIS — G20 Parkinson's disease: Secondary | ICD-10-CM

## 2021-12-06 DIAGNOSIS — I5022 Chronic systolic (congestive) heart failure: Secondary | ICD-10-CM

## 2021-12-06 NOTE — Patient Instructions (Addendum)
Mr. Grape ?It was a pleasure speaking with you  ?Below is a summary of your health goals and care plan ? ?If you have any questions or concerns, please feel free to contact me either at the phone number below or with a MyChart message.  ? ?Keep up the good work! ? ?[name], PharmD ?Clinical Pharmacist ?[facility] ?[place] ?[phone] (direct line)  ?[phone] (main office number) ? ? ?Chronic Care Management - Pharmacy Care Plan ? ?Parkinson's Disease:  ?Goal: decrease symptoms while minimizing medication side effects ?Managed by Dr Wells Guiles [name]  ?Currently therapy:  ?Rytary 36.25/'145mg'$  - take 1 tablet 3 times a day ?Interventions:  ?Recommended yearly skin checks with dermatology due to risk of melanoma with Parkinson's Diseases / treatment. Patient is agreeable to dermatology referral.  ?Consider restarting physical therapy ? ? ?Hypertension / congestive heart failure: ?Goal: blood pressure < 140/90  ?Goal for congestive heart failure:  management of symptoms while preventing exacerbations / hospitalizations ?BP Readings from Last 3 Encounters:  ?10/27/21 122/80  ?10/23/21 (!) 157/100  ?10/08/21 126/83  ? ?Current treatment: ?Furosemide '40mg'$  -  take 1 tablet 3 times per week ?Metoprolol tartrate '25mg'$  - take 1 tablet daily  ?Entresto 24/'26mg'$  - take 1 tablet daily   ?Interventions:  ?Signs and symptoms of congestive heart failure exacerbation - weight gain, shortness of breath, abdominal fullness, swelling in legs or abdomen, Fatigue and weakness, changes in ability to perform usual activities, persistent cough or wheezing with white or pink blood-tinged mucus, nausea and lack of appetite ?Recommend checking weight daily - report weight gain of more than 3 lbs in 24 hours or 5 lbs in 1 week. ?Continue current regimen for hypertension and congestive heart failure ?Continue to follow up with Dr [name], cardiologist ? ?Hyperlipidemia / heart disease: ?LDL improved but not at goal of  < 70; Triglycerides at goal of < 150 ? ?Lab Results  ?Component Value Date  ? [place] 96 09/27/2021  ? LDLCALC 125 (H) 03/25/2021  ? LDLCALC 125 (H) 11/18/2020  ? ?Lab Results  ?Component Value Date  ? TRIG 120 09/27/2021  ? TRIG 174 (H) 03/25/2021  ? TRIG 148 11/18/2020  ? ?Current treatment: ?Aspirin '81mg'$  daily  ?Pravastatin '20mg'$  daily  ?Interventions:  ?Make sure to take pravastatin and aspirin EVERY day.  ? ?Depression ?Current treatment: ?Trintellix '5mg'$  once a day ?Interventions:  ?Reviewed importance of taking Trintellix every day.  ?Continue to have regular visits at with [facility] at Dr Doristine Devoid office ? ?Medication management ?Pharmacist Clinical Goal(s): ?Over the next 90 days, patient will work with PharmD and providers to maintain optimal medication adherence ?Current pharmacy: [facility] ?Interventions ?Comprehensive medication review performed. ?Reviewed refill history and assessed adherence ?Take all medications as prescribed and to use adherence packaging just as it is set up.  ?Communicate medication problems or suspected side effect to either the clinical pharmacist or a medical provider so that adjustments can be made.   ?  ?Patient Goals/Self-Care Activities ?Over the next 90 days, patient will:  ?Focus on medication adherence by filling medications appropriately. Set alarm on phone to remind you to take medications.  ?Bring medications to upcoming appointment 01/03/2022 ?Take medications as prescribed - EVERY day ?Report any questions or concerns to PharmD and/or provider(s). Is it important for your medical team to be aware if you suspect you are having side effects or taking medications differently.  ?Weigh daily, and contact provider if weight gain of of more than 3 lbs  in 24 hours or 5 lbs in 1 week ?Continue to use fluticasone nasal spray ? ?Follow Up Plan: Face to face visit 01/03/2022  ? ?Print copy of patient instructions, educational materials, and care plan provided in person.   ?

## 2021-12-06 NOTE — Telephone Encounter (Signed)
Tammy from [facility] called to see if the [facility] could get more samples for the rytary or if he could get a prescription for it.  ?

## 2021-12-07 MED ORDER — RYTARY 36.25-145 MG PO CPCR: ORAL_CAPSULE | ORAL | 1 refills | Status: DC

## 2021-12-07 NOTE — Telephone Encounter (Signed)
Called patient and reminded him I was waiting to hear back from him on which pharmacy he wanted his Rytary prescription sent. He had previously told me he didn't want to use [facility] and would want to change to a different pharmacy. I told patient to let me know and I would send it. Called patient today and he said to go ahead and send to [facility]. Prescription has been sent  ?

## 2021-12-12 NOTE — Chronic Care Management (AMB) (Signed)
? ? ?Chronic Care Management ?Pharmacy Note ? ?12/12/2021 ?Name:  Shawn Meza MRN:  [id number] DOB:  05-07-1946 ? ?Summary: ?Patient requested change from adherence packaging to get medications in bottles again so that he can identify medications and put indications on the bottles. Coordinated with [facility] to transition back to bottle for the next refill (due next week). Assisted patient I identifying medications in his current packaging.  ?Patient is weighing daily and weight has been stable. He is taking furosemide 27m - 0.5 tablet = 228monly Mondays, Wednesdays and Fridays.  ?He mentioned that he only had about 2 weeks of Rytary left and dose not currently have a prescription. LM with Dr TaDoristine Devoidffice to request either more samples to last until next follow up or for a prescription to be called in.  ?Patient states that brand Nexium worked better, however it is not covered by his insurance. He does have [employer]-the-counter benefits of about $250 per quarter (per patient). Recommended he try over-the-counter Nexium 2034m take 1 or 2 capsules dail.  ? ? ?Subjective: ?Shawn Meza an [age]o. year old male who is a primary patient of WenShelda PalO.  The CCM team was consulted for assistance with disease management and care coordination needs.   ? ?Engaged with patient face to face for follow up visit in response to provider referral for pharmacy case management and/or care coordination services.  ? ?Consent to Services:  ?The patient was given information about Chronic Care Management services, agreed to services, and gave verbal consent prior to initiation of services.  Please see initial visit note for detailed documentation.  ? ?Patient Care Team: ?WenShelda PalO as PCP - General (Family Medicine) ?MunRichardo PriestD as PCP - Cardiology (Cardiology) ?GroMichael BostonD (General Surgery) ?MorPollyann SamplesD as Consulting Physician (General Surgery) ?TatLudwig ClarksO as Consulting Physician (Neurology) ?WalLuretha RuedN as Case Manager ?EckCherre RobinsPH-CPP (Pharmacist) ? ?Recent office visits: ?10/27/2021 - Fam Med (Dr WenNani Ravensell Adult Exam. Referred to dermatology and ophthalmology. Received injection in rigth hip for bursitis today. F/U 6 months.  ?08/13/21-Nicholas PauNolene EbbsO (PCP, Video visit) Seen for back pain. Start Voltaren 1% gel and Tizanidine 4 mg. Follow up in 1 month. ?  ?Recent consult visits:  ?[place]2021 [place] [name].  ?11/03/2021 - [facility] - LCSW, [name].  ?10/19/2021 - Neuro / LCSW - phone call. Started physical therapy. Has not started recommended dance classes. Mood reported to have improved.  ?10/12/2021 - Neuro / LCSW (TaUlyses Jarredeen for counseling. ?10/08/2021 - Neuro (Dr [name]) F/U Parkinson's Disease. Given samples of Rytary 145m74m take 3 times a day. [facility]erred for physical therapy.  ?09/27/21-Brian J. MMora Bellman (Cardiology) Cardiac follow up visit. Follow up in 6 months. ?09/16/21-Jeremy E. SHinton Dyer (Sports medicine) Seen for acute on chronic ride sided radicular pain. ?08/23/21-Misty A. Taylor-Paladino, LCSW. (Neurology, Video visit) Seen for depression. Follow up in 4 weeks.  ?04/08/2022 - Neurology (Dr [name]) Seen for f/u Parkinson's Disease. Continue carbidopa/levodopa 25/100 CR, 2 tablets at 8 AM/2 tablets at noon/1 tablet at 4 PM. Continue carbidopa/levodopa 50/200 CR at bedtime.  Depression -On Trintellix.  Managed by primary care.  Declines psychiatry.  I still think that this is a big issue in patient's life. B12 deficiency -On supplementation; Lumbar spinal stenosis -Following with Dr. SterVertell Limber  Phone number was given to patient to contact for steroid injections;  Insomnia -Continue melatonin to 3 mg. ?  ?Hospital visits:  ?08/05/2021 - ED Visit at [facility] due to Leg pain.   ?New?Medications Started at [facility] Discharge:?? ?predniSONE (DELTASONE) 20 MG tablet  Daily      ?Lidocaine 4% patch every 12 hours prn ?Medication Changes at Hospital Discharge: ?None noted ?Medications Discontinued at Hospital Discharge: ?None noted ? ? ?Objective: ? ?Lab Results  ?Component Value Date  ? CREATININE 0.95 10/22/2021  ? CREATININE 0.98 09/27/2021  ? CREATININE 0.93 03/25/2021  ? ? ?Lab Results  ?Component Value Date  ? HGBA1C 5.7 (H) 12/19/2018  ? ?Last diabetic Eye exam: No results found for: HMDIABEYEEXA  ?Last diabetic Foot exam: No results found for: HMDIABFOOTEX  ? ?   ?Component Value Date/Time  ? CHOL 160 09/27/2021 1440  ? TRIG 120 09/27/2021 1440  ? TRIG 107 05/09/2010 0000  ? HDL 42 09/27/2021 1440  ? CHOLHDL 3.8 09/27/2021 1440  ? CHOLHDL 3.6 05/20/2015 1410  ? VLDL 27 05/20/2015 1410  ? LDLCALC 96 09/27/2021 1440  ? ? ? ?  Latest Ref Rng & Units 09/27/2021  ?  2:40 PM 03/25/2021  ?  4:44 PM 08/11/2020  ?  2:19 PM  ?Hepatic Function  ?Total Protein 6.0 - 8.5 g/dL 6.7   6.8   6.9    ?Albumin 3.7 - 4.7 g/dL 4.4   4.2   4.1    ?AST 0 - 40 IU/L '20   25   23    ' ?ALT 0 - 44 IU/L '18   20   31    ' ?Alk Phosphatase 44 - 121 IU/L 72   71   69    ?Total Bilirubin 0.0 - 1.2 mg/dL 0.4   0.3   0.5    ? ? ?Lab Results  ?Component Value Date/Time  ? TSH 1.73 01/22/2020 03:28 PM  ? TSH 2.010 12/19/2018 11:23 AM  ? ? ? ?  Latest Ref Rng & Units 10/22/2021  ?  9:23 PM 03/30/2020  ?  4:02 PM 03/30/2020  ?  3:54 PM  ?CBC  ?WBC 4.0 - 10.5 K/uL 9.0    9.6    ?Hemoglobin 13.0 - 17.0 g/dL 16.2   15.6   15.6    ?Hematocrit 39.0 - 52.0 % 48.4   46.0   48.3    ?Platelets 150 - 400 K/uL 202    189    ? ? ?Lab Results  ?Component Value Date/Time  ? VD25OH 28.37 (L) 07/28/2020 01:34 PM  ? VD25OH 19.92 (L) 01/22/2020 03:28 PM  ? ? ?Clinical ASCVD: Yes  ?The ASCVD Risk score (Arnett DK, et al., 2019) failed to calculate for the following reasons: ?  The patient has a prior MI or stroke diagnosis   ? ? ? ?Social History   ? ?Tobacco Use  ?Smoking Status Never  ? Passive exposure: Never  ?Smokeless Tobacco Never  ? ?BP Readings from Last 3 Encounters:  ?10/27/21 122/80  ?10/23/21 (!) 157/100  ?10/08/21 126/83  ? ?Pulse Readings from Last 3 Encounters:  ?10/27/21 79  ?10/23/21 93  ?10/08/21 72  ? ?Wt Readings from Last 3 Encounters:  ?10/27/21 206 lb 2 oz (93.5 kg)  ?10/22/21 206 lb 9.1 oz (93.7 kg)  ?10/08/21 206 lb 9.6 oz (93.7 kg)  ? ? ?Assessment: Review of patient past medical history, allergies, medications, health status, including review of consultants reports, laboratory and other  test data, was performed as part of comprehensive evaluation and provision of chronic care management services.  ? ?SDOH:  (Social Determinants of Health) assessments and interventions performed:  ? ? ?CCM Care Plan ? ?Allergies  ?Allergen Reactions  ? Rosuvastatin Other (See Comments)  ?  Whole body aches  ? Testosterone Other (See Comments)  ?  ABDOMINAL PAIN and cramping  ? Fluoxetine Other (See Comments)  ?  Caused depression and aggression  ? Requip [Ropinirole] Nausea Only  ? Tizanidine Hcl Hives  ? ? ?Medications Reviewed Today   ? ? Reviewed by [name], RPH-CPP (Pharmacist) on 12/06/21 at 1446  Med List Status: <None>  ? ?Medication Order Taking? Sig Documenting Provider Last Dose Status Informant  ?acetaminophen (TYLENOL) 325 MG tablet [id number] Yes Take 162.5 mg by mouth every 6 (six) hours as needed. [provider] Taking Active   ?aspirin 81 MG EC tablet [id number] Yes Take 1 tablet (81 mg total) by mouth daily. [name], DO Taking Active   ?Carbidopa-Levodopa ER (RYTARY) 36.25-145 MG CPCR [id number] Yes Samples of this drug were given to the patient, quantity 2, Lot Number [id number] A Exp 08/23 [name], Eustace Quail, DO Taking Active   ?esomeprazole (NEXIUM) 40 MG capsule [id number] No Take 1 capsule (40 mg total) by mouth at bedtime.  ?Patient not taking: Reported on 12/06/2021  ? [name], DO Not Taking  Active   ?FLOVENT HFA 110 MCG/ACT inhaler [id number] No Inhale 1 puff into the lungs daily as needed (wheezing, shortness of breath).  ?Patient not taking: Reported on 12/06/2021  ? [name], DO Not

## 2021-12-16 ENCOUNTER — Ambulatory Visit (INDEPENDENT_AMBULATORY_CARE_PROVIDER_SITE_OTHER): Payer: Medicare Other | Admitting: Licensed Clinical Social Worker

## 2021-12-16 ENCOUNTER — Ambulatory Visit: Payer: Medicare Other

## 2021-12-16 DIAGNOSIS — F4321 Adjustment disorder with depressed mood: Secondary | ICD-10-CM

## 2021-12-16 DIAGNOSIS — F339 Major depressive disorder, recurrent, unspecified: Secondary | ICD-10-CM

## 2021-12-16 DIAGNOSIS — G20 Parkinson's disease: Secondary | ICD-10-CM

## 2021-12-16 NOTE — Patient Instructions (Addendum)
Visit Information  Thank you for taking time to visit with me today. Please don't hesitate to contact me if I can be of assistance to you before our next scheduled telephone appointment.  Following are the goals we discussed today:  Patient Goals/Self-Care Activities: Take medications as prescribed   Attend all scheduled provider appointments Call provider office for new concerns or questions  Continue to to explore other social activities or engage in activities you enjoys.  Contact your nurse case manager for care management and/or care coordination needs Review educational material provided re: sciatica and low back pain. Contact RNCM if you have any questions  Our next appointment is by telephone on 02/07/22 at 10:45 am  Please call the care guide team at [phone] if you need to cancel or reschedule your appointment.   If you are experiencing a Mental Health or [place] or need someone to talk to, please call the Suicide and Crisis Lifeline: 988 call 1-800-273-TALK (toll free, 24 hour hotline)   The patient verbalized understanding of instructions, educational materials, and care plan provided today and agreed to receive a mailed copy of patient instructions, educational materials, and care plan.   Thea Silversmith, RN, MSN, BSN, CCM Care Management Coordinator [facility] [phone]   Sciatica  Sciatica is pain, weakness, tingling, or loss of feeling (numbness) along the sciatic nerve. The sciatic nerve starts in the lower back and goes down the back of each leg. Sciatica usually goes away on its own or with treatment. Sometimes, sciatica may come back (recur). What are the causes? This condition happens when the sciatic nerve is pinched or has pressure put on it. This may be the result of: A disk in between the bones of the spine bulging out too far (herniated disk). Changes in the spinal disks that occur with aging. A condition that affects a  muscle in the butt. Extra bone growth near the sciatic nerve. A break (fracture) of the area between your hip bones (pelvis). Pregnancy. Tumor. This is rare. What increases the risk? You are more likely to develop this condition if you: Play sports that put pressure or stress on the spine. Have poor strength and ease of movement (flexibility). Have had a back injury in the past. Have had back surgery. Sit for long periods of time. Do activities that involve bending or lifting over and over again. Are very overweight (obese). What are the signs or symptoms? Symptoms can vary from mild to very bad. They may include: Any of these problems in the lower back, leg, hip, or butt: Mild tingling, loss of feeling, or dull aches. Burning sensations. Sharp pains. Loss of feeling in the back of the calf or the sole of the foot. Leg weakness. Very bad back pain that makes it hard to move. These symptoms may get worse when you cough, sneeze, or laugh. They may also get worse when you sit or stand for long periods of time. How is this treated? This condition often gets better without any treatment. However, treatment may include: Changing or cutting back on physical activity when you have pain. Doing exercises and stretching. Putting ice or heat on the affected area. Medicines that help: To relieve pain and swelling. To relax your muscles. Shots (injections) of medicines that help to relieve pain, irritation, and swelling. Surgery. Follow these instructions at home: Medicines Take over-the-counter and prescription medicines only as told by your doctor. Ask your doctor if the medicine prescribed to you: Requires  you to avoid driving or using heavy machinery. Can cause trouble pooping (constipation). You may need to take these steps to prevent or treat trouble pooping: Drink enough fluids to keep your pee (urine) pale yellow. Take over-the-counter or prescription medicines. Eat foods that are  high in fiber. These include beans, whole grains, and fresh fruits and vegetables. Limit foods that are high in fat and sugar. These include fried or sweet foods. Managing pain     If told, put ice on the affected area. Put ice in a plastic bag. Place a towel between your skin and the bag. Leave the ice on for 20 minutes, 2-3 times a day. If told, put heat on the affected area. Use the heat source that your doctor tells you to use, such as a moist heat pack or a heating pad. Place a towel between your skin and the heat source. Leave the heat on for 20-30 minutes. Remove the heat if your skin turns bright red. This is very important if you are unable to feel pain, heat, or cold. You may have a greater risk of getting burned. Activity  Return to your normal activities as told by your doctor. Ask your doctor what activities are safe for you. Avoid activities that make your symptoms worse. Take short rests during the day. When you rest for a long time, do some physical activity or stretching between periods of rest. Avoid sitting for a long time without moving. Get up and move around at least one time each hour. Exercise and stretch regularly, as told by your doctor. Do not lift anything that is heavier than 10 lb (4.5 kg) while you have symptoms of sciatica. Avoid lifting heavy things even when you do not have symptoms. Avoid lifting heavy things over and over. When you lift objects, always lift in a way that is safe for your body. To do this, you should: Bend your knees. Keep the object close to your body. Avoid twisting. General instructions Stay at a healthy weight. Wear comfortable shoes that support your feet. Avoid wearing high heels. Avoid sleeping on a mattress that is too soft or too hard. You might have less pain if you sleep on a mattress that is firm enough to support your back. Keep all follow-up visits as told by your doctor. This is important. Contact a doctor if: You  have pain that: Wakes you up when you are sleeping. Gets worse when you lie down. Is worse than the pain you have had in the past. Lasts longer than 4 weeks. You lose weight without trying. Get help right away if: You cannot control when you pee (urinate) or poop (have a bowel movement). You have weakness in any of these areas and it gets worse: Lower back. The area between your hip bones. Butt. Legs. You have redness or swelling of your back. You have a burning feeling when you pee. Summary Sciatica is pain, weakness, tingling, or loss of feeling (numbness) along the sciatic nerve. This condition happens when the sciatic nerve is pinched or has pressure put on it. Sciatica can cause pain, tingling, or loss of feeling (numbness) in the lower back, legs, hips, and butt. Treatment often includes rest, exercise, medicines, and putting ice or heat on the affected area. This information is not intended to replace advice given to you by your health care provider. Make sure you discuss any questions you have with your health care provider. Document Revised: 08/06/2018 Document Reviewed: 08/06/2018 Elsevier Patient Education  [place].  Hip Pain The hip is the joint between the upper legs and the lower pelvis. The bones, cartilage, tendons, and muscles of your hip joint support your body and allow you to move around. Hip pain can range from a minor ache to severe pain in one or both of your hips. The pain may be felt on the inside of the hip joint near the groin, or on the outside near the buttocks and upper thigh. You may also have swelling or stiffness in your hip area. Follow these instructions at home: Managing pain, stiffness, and swelling     If directed, put ice on the painful area. To do this: Put ice in a plastic bag. Place a towel between your skin and the bag. Leave the ice on for 20 minutes, 2-3 times a day. If directed, apply heat to the affected area as often as  told by your health care provider. Use the heat source that your health care provider recommends, such as a moist heat pack or a heating pad. Place a towel between your skin and the heat source. Leave the heat on for 20-30 minutes. Remove the heat if your skin turns bright red. This is especially important if you are unable to feel pain, heat, or cold. You may have a greater risk of getting burned. Activity Do exercises as told by your health care provider. Avoid activities that cause pain. General instructions  Take over-the-counter and prescription medicines only as told by your health care provider. Keep a journal of your symptoms. Write down: How often you have hip pain. The location of your pain. What the pain feels like. What makes the pain worse. Sleep with a pillow between your legs on your most comfortable side. Keep all follow-up visits as told by your health care provider. This is important. Contact a health care provider if: You cannot put weight on your leg. Your pain or swelling continues or gets worse after one week. It gets harder to walk. You have a fever. Get help right away if: You fall. You have a sudden increase in pain and swelling in your hip. Your hip is red or swollen or very tender to touch. Summary Hip pain can range from a minor ache to severe pain in one or both of your hips. The pain may be felt on the inside of the hip joint near the groin, or on the outside near the buttocks and upper thigh. Avoid activities that cause pain. Write down how often you have hip pain, the location of the pain, what makes it worse, and what it feels like. This information is not intended to replace advice given to you by your health care provider. Make sure you discuss any questions you have with your health care provider. Document Revised: 12/03/2018 Document Reviewed: 12/03/2018 Elsevier Patient Education  [place].   Chronic Back Pain When back pain lasts  longer than 3 months, it is called chronic back pain. Pain may get worse at certain times (flare-ups). There are things you can do at home to manage your pain. Follow these instructions at home: Pay attention to any changes in your symptoms. Take these actions to help with your pain: Managing pain and stiffness     If told, put ice on the painful area. Your doctor may tell you to use ice for 24-48 hours after the flare-up starts. To do this: Put ice in a plastic bag. Place a towel between your skin and  the bag. Leave the ice on for 20 minutes, 2-3 times a day. If told, put heat on the painful area. Do this as often as told by your doctor. Use the heat source that your doctor recommends, such as a moist heat pack or a heating pad. Place a towel between your skin and the heat source. Leave the heat on for 20-30 minutes. Take off the heat if your skin turns bright red. This is especially important if you are unable to feel pain, heat, or cold. You may have a greater risk of getting burned. Soak in a warm bath. This can help relieve pain. Activity  Avoid bending and other activities that make pain worse. When standing: Keep your upper back and neck straight. Keep your shoulders pulled back. Avoid slouching. When sitting: Keep your back straight. Relax your shoulders. Do not round your shoulders or pull them backward. Do not sit or stand in one place for long periods of time. Take short rest breaks during the day. Lying down or standing is usually better than sitting. Resting can help relieve pain. When sitting or lying down for a long time, do some mild activity or stretching. This will help to prevent stiffness and pain. Get regular exercise. Ask your doctor what activities are safe for you. Do not lift anything that is heavier than 10 lb (4.5 kg) or the limit that you are told, until your doctor says that it is safe. To prevent injury when you lift things: Bend your knees. Keep the  weight close to your body. Avoid twisting. Sleep on a firm mattress. Try lying on your side with your knees slightly bent. If you lie on your back, put a pillow under your knees. Medicines Treatment may include medicines for pain and swelling taken by mouth or put on the skin, prescription pain medicine, or muscle relaxants. Take over-the-counter and prescription medicines only as told by your doctor. Ask your doctor if the medicine prescribed to you: Requires you to avoid driving or using machinery. Can cause trouble pooping (constipation). You may need to take these actions to prevent or treat trouble pooping: Drink enough fluid to keep your pee (urine) pale yellow. Take over-the-counter or prescription medicines. Eat foods that are high in fiber. These include beans, whole grains, and fresh fruits and vegetables. Limit foods that are high in fat and sugars. These include fried or sweet foods. General instructions Do not use any products that contain nicotine or tobacco, such as cigarettes, e-cigarettes, and chewing tobacco. If you need help quitting, ask your doctor. Keep all follow-up visits as told by your doctor. This is important. Contact a doctor if: Your pain does not get better with rest or medicine. Your pain gets worse, or you have new pain. You have a high fever. You lose weight very quickly. You have trouble doing your normal activities. Get help right away if: One or both of your legs or feet feel weak. One or both of your legs or feet lose feeling (have numbness). You have trouble controlling when you poop (have a bowel movement) or pee (urinate). You have bad back pain and: You feel like you may vomit (nauseous), or you vomit. You have pain in your belly (abdomen). You have shortness of breath. You faint. Summary When back pain lasts longer than 3 months, it is called chronic back pain. Pain may get worse at certain times (flare-ups). Use ice and heat as told by  your doctor. Your doctor may tell you  to use ice after flare-ups. This information is not intended to replace advice given to you by your health care provider. Make sure you discuss any questions you have with your health care provider. Document Revised: 08/28/2019 Document Reviewed: 08/28/2019 Elsevier Patient Education  [place].

## 2021-12-16 NOTE — Chronic Care Management (AMB) (Signed)
Chronic Care Management   CCM RN Visit Note  12/16/2021 Name: Shawn Meza MRN: [id number] DOB: 1946/03/29  Subjective: Shawn Meza is a 76 y.o. year old male who is a primary care patient of [name], DO. The care management team was consulted for assistance with disease management and care coordination needs.    Engaged with patient by telephone for follow up visit in response to provider referral for case management and/or care coordination services.   Consent to Services:  The patient was given information about Chronic Care Management services, agreed to services, and gave verbal consent prior to initiation of services.  Please see initial visit note for detailed documentation.   Patient agreed to services and verbal consent obtained.   Assessment: Review of patient past medical history, allergies, medications, health status, including review of consultants reports, laboratory and other test data, was performed as part of comprehensive evaluation and provision of chronic care management services.   SDOH (Social Determinants of Health) assessments and interventions performed:    CCM Care Plan  Allergies  Allergen Reactions   Rosuvastatin Other (See Comments)    Whole body aches   Testosterone Other (See Comments)    ABDOMINAL PAIN and cramping   Fluoxetine Other (See Comments)    Caused depression and aggression   Requip [Ropinirole] Nausea Only   Tizanidine Hcl Hives    Outpatient Encounter Medications as of 12/16/2021  Medication Sig   acetaminophen (TYLENOL) 325 MG tablet Take 162.5 mg by mouth every 6 (six) hours as needed.   aspirin 81 MG EC tablet Take 1 tablet (81 mg total) by mouth daily.   Carbidopa-Levodopa ER (RYTARY) 36.25-145 MG CPCR Samples of this drug were given to the patient, quantity 2, Lot Number [id number] A Exp 08/23   Carbidopa-Levodopa ER (RYTARY) 36.25-145 MG CPCR Take 1 tablet 3 times a day   esomeprazole (NEXIUM) 40 MG capsule  Take 1 capsule (40 mg total) by mouth at bedtime.   FLOVENT HFA 110 MCG/ACT inhaler Inhale 1 puff into the lungs daily as needed (wheezing, shortness of breath).   fluticasone (FLONASE) 50 MCG/ACT nasal spray Place 1 spray into both nostrils daily.   furosemide (LASIX) 40 MG tablet Take 0.5 tablets (20 mg total) by mouth every Monday, Wednesday, and Friday.   metoprolol tartrate (LOPRESSOR) 25 MG tablet Take 1 tablet (25 mg total) by mouth daily.   nitroGLYCERIN (NITROSTAT) 0.4 MG SL tablet Place 1 tablet (0.4 mg total) under the tongue every 5 (five) minutes x 3 doses as needed for chest pain.   pravastatin (PRAVACHOL) 20 MG tablet Take 1 tablet (20 mg total) by mouth daily.   prednisoLONE acetate (PRED FORTE) 1 % ophthalmic suspension Place 1 drop into both eyes 2 (two) times daily.   sacubitril-valsartan (ENTRESTO) 24-26 MG Take 1 tablet by mouth daily.   vortioxetine HBr (TRINTELLIX) 5 MG TABS tablet Take 1 tablet (5 mg total) by mouth daily. (Patient taking differently: Take 2.5 mg by mouth daily.)   gabapentin (NEURONTIN) 100 MG capsule Take 1 capsule (100 mg total) by mouth at bedtime. (Patient not taking: Reported on 12/06/2021)   tiZANidine (ZANAFLEX) 4 MG tablet Take 1 tablet (4 mg total) by mouth every 6 (six) hours as needed for muscle spasms. (Patient not taking: Reported on 11/11/2021)   No facility-administered encounter medications on file as of 12/16/2021.    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   Diarrhea  11/04/2020   Rectal bleeding 11/04/2020   Trochanteric bursitis of right hip    Transient ischemic attack    NSTEMI (non-ST elevated myocardial infarction) ([place])    Nephrolithiasis    Metabolic syndrome    Major depression, chronic    Long-term use of aspirin therapy    Hypercholesterolemia    Hiatal hernia    GERD (gastroesophageal reflux disease)    Essential tremor    Essential hypertension    Erectile dysfunction     Elevated PSA    Colon cancer (HCC)    Chronic coronary artery disease    Arthritis    Myofascial pain 12/20/2019   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   Low back pain 10/28/2019   Radiculopathy, lumbar region 10/28/2019   Fatigue 16/05/9603   Chronic systolic (congestive) heart failure ([place]) 09/18/2018   Ischemic cardiomyopathy 09/16/2018   Aortic regurgitation 09/16/2018   Cardiomyopathy, unspecified ([place]) 06/13/2018   Abscess of right axilla 12/12/2017   BMI 33.0-33.9,adult 12/12/2017   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   Acute blood loss anemia 10/25/2017   Acute postoperative respiratory insufficiency 10/25/2017   Postoperative delirium 10/25/2017   Dyslipidemia 10/17/2017   SOB (shortness of breath) 03/31/2017   Long term current use of aspirin 03/29/2017   Parkinson's disease ([place]) 01/02/2017   Memory change 07/06/2015   Depression, recurrent ([place]) 07/06/2015   Medial meniscus tear 10/11/2011   Neuroma of foot 10/11/2011   Coronary artery disease involving native coronary artery of native heart with angina pectoris ([place]) 12/28/2010   OTHER TESTICULAR HYPOFUNCTION 05/20/2010   Mixed hyperlipidemia 05/20/2010   Hypertensive heart disease with heart failure ([place]) 05/20/2010   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 05/20/2010   GERD 05/20/2010   TRANSIENT ISCHEMIC ATTACK, HX OF 05/20/2010   NEPHROLITHIASIS, HX OF 05/20/2010   BENIGN PROSTATIC HYPERTROPHY, HX OF, S/P TURP 05/20/2010    Conditions to be addressed/monitored:Depression and Chronic pain, Parkinson's Disease  Care Plan : RN Case Manager Plan of Care  Updates made by Luretha Rued, RN since 12/16/2021 12:00 AM     Problem: Chronic Disease Managment Education and/or Care Coordination needs   Priority: High     Long-Range Goal: Development of plan of care for Chronic Disease Management and/or care coordination needs   Start Date: 09/23/2021  Expected End Date: 03/23/2022   Priority: High  Note:   Current Barriers:  Knowledge Deficits related to plan of care for management of Chronic Pain and Movement Disorder Parkinson's Disease  Care Coordination needs related to Transportation and Medication Reconciliation and Medication management  PHQ2 6 PHQ9 -18. Currently being followed by LCSW Althea Charon for Psychotherapeutic services/neurology office  Limited social support 11/11/21 Mr. Hagood reports he is attending physical therapy sessions and states the sciatic nerve pain "is a lot better". Reports pain is typically sciatic/back pain and reports "5-6" on the pain scale He continues to attend counseling sessions through neurologist office. Mr. Helming states he would like to have medications changed from packaging to bottles adding the packaging is hard to read; he also has questions regarding next steps for receiving Rytary. He is currently has samples now. He has concerns regarding cost and if he will be able to afford the medication 12/16/21 Mr. Cuffee reports he has stopped attending physical therapy sessions because it was hurting too bad. He reports pain symptoms improved once he ended therapy sessions and currently "5" on pain scale. His goal is a "3".  He reports he  has used WD 40 topically in the past and states it has helped. RNCM instructed patient that this method has not been proven to be safe: could cause skin irritation: redness; itching, burning and defatting and possible dermatitis with prolonged repeated contact. Patient verbalized understanding. He reports overall improved. He continues to see therapist for counseling. Request another [facility] Calendar which he states has found helpful in keeping up with appointments.   RNCM Clinical Goal(s):  Patient will verbalize understanding of plan for management of Chronic Pain, Movement Disorder Parkinson's Disease and Mental Health Concerns as evidenced by self report and/or chart notation take all medications  exactly as prescribed and will call provider for medication related questions as evidenced by self report and/or chart notation    through collaboration with RN Care manager, provider, and care team.   Interventions: 1:1 collaboration with primary care provider regarding development and update of comprehensive plan of care as evidenced by provider attestation and co-signature Inter-disciplinary care team collaboration (see longitudinal plan of care) Evaluation of current treatment plan related to  self management and patient's adherence to plan as established by provider  Depression Interventions:  (Status:  Goal on track:  Yes.)  Long Term Goal RNCM encouraged patient to continue to work with LCSW as scheduled and provided positive feedback regarding attending these sessions Encouraged to continue to explore other social activities or engage in activities he enjoys   Pain Interventions:  (Status:  Goal on track:  Yes.) Long Term Goal Medications reviewed and encouraged to take as prescribed Discussed importance of adherence to all scheduled medical appointments Counseled on the importance of reporting any/all new or changed pain symptoms or management strategies to pain management provider Discussed alternative pain relief strategies: such as Turmeric and Ginger Tea, epsom salt soak, warmth/heat packs, icy hot    Provided education re: sciatica and low back pain  Movement Disorder/Parkinson's Disease Interventions (Status:  Goal on track:  Yes.)  Long Term Goal Evaluation of current treatment plan related to  Parkinson's Disease, Chronic Pain , Transportation self-management and patient's adherence to plan as established by provider Encouraged to continue to take medications as prescribed Encouraged to remain active.   Patient Goals/Self-Care Activities: Take medications as prescribed   Attend all scheduled provider appointments Call provider office for new concerns or questions  Continue  to to explore other social activities or engage in activities you enjoys.  Contact your nurse case manager for care management and/or care coordination needs Review educational material provided re: sciatica and low back pain. Contact RNCM if you have any questions   Plan:Telephone follow up appointment with care management team member scheduled for:  02/07/22 The patient has been provided with contact information for the care management team and has been advised to call with any health related questions or concerns.   Thea Silversmith, RN, MSN, BSN, CCM Care Management Coordinator [facility] [phone]

## 2021-12-17 NOTE — BH Specialist Note (Signed)
Integrated Behavioral Health Follow Up In-Person Visit  MRN: [id number] Name: Shawn Meza  Number of [place] Clinician visits: Additional Visit  Session Start time: 1025   Session End time: 1540  Total time in minutes: 55   Types of Service: Individual psychotherapy  Interpretor:No. Interpretor Name and Language: NA   Subjective: Shawn Meza is a 76 y.o. male accompanied by  Self but also present was Pt's exwife  Patient was referred by Dr. Wells Guiles [name] for Depression . 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  Life Changes: Pt 's exwife has moved in with Pt who has cognitive impairment   Patient and/or Family's Strengths/Protective Factors: Concrete supports in place (healthy food, safe environments, etc.)  Goals Addressed: Patient will:  Reduce symptoms of: anxiety   Increase knowledge and/or ability of: coping skills and stress reduction   Demonstrate ability to: Increase healthy adjustment to current life circumstances and Increase adequate support systems for patient/family  Progress towards Goals: Ongoing  Interventions: Interventions utilized:  Solution-Focused Strategies, Psychoeducation and/or Health Education, and Link to [employer] Standardized Assessments completed: Not Needed  Patient and/or Family Response: Pt open to resources provided and develop and use coping skills for new role change   Patient Centered Plan: Patient is on the following Treatment Plan(s): Gain knowledge on Exwife's  condition, and have discission on long terms goals including financial and health care, increase informal and formal support system.    Assessment: Patient currently experiencing Pt reports happy about exwife being back in his life, however expressed concern and being anxious with life transition of being a possible caregiver with her cognitive impairments  .   Patient may benefit from Increase knowledge of Pt's condition and issues.Increase support with caregiver support group.  Talk with Trish Fountain for follow up with Health care and Financial planning.  Discussed ways of self care and utilizing effective coping skills for this transition.     Plan: Follow up with behavioral health clinician on : In three to four weeks  Behavioral recommendations: Follow up with Trish Fountain and Dementia resources in the community  Referral(s): Elder Law and Dementia resources in the community , [facility], [facility], other Adult Day Health , DSS and  caregiver support  "From scale of 1-10, how likely are you to follow plan?": 8  Kess Mcilwain A Taylor-Paladino, LCSW

## 2021-12-29 DIAGNOSIS — I2510 Atherosclerotic heart disease of native coronary artery without angina pectoris: Secondary | ICD-10-CM | POA: Diagnosis not present

## 2021-12-29 DIAGNOSIS — G20 Parkinson's disease: Secondary | ICD-10-CM

## 2021-12-29 DIAGNOSIS — I110 Hypertensive heart disease with heart failure: Secondary | ICD-10-CM | POA: Diagnosis not present

## 2021-12-29 DIAGNOSIS — I509 Heart failure, unspecified: Secondary | ICD-10-CM

## 2021-12-29 DIAGNOSIS — E785 Hyperlipidemia, unspecified: Secondary | ICD-10-CM | POA: Diagnosis not present

## 2021-12-29 DIAGNOSIS — F32A Depression, unspecified: Secondary | ICD-10-CM

## 2022-01-03 ENCOUNTER — Ambulatory Visit (INDEPENDENT_AMBULATORY_CARE_PROVIDER_SITE_OTHER): Payer: Medicare Other | Admitting: Pharmacist

## 2022-01-03 DIAGNOSIS — G20 Parkinson's disease: Secondary | ICD-10-CM

## 2022-01-03 DIAGNOSIS — F339 Major depressive disorder, recurrent, unspecified: Secondary | ICD-10-CM

## 2022-01-03 DIAGNOSIS — G20A1 Parkinson's disease without dyskinesia, without mention of fluctuations: Secondary | ICD-10-CM

## 2022-01-03 DIAGNOSIS — E782 Mixed hyperlipidemia: Secondary | ICD-10-CM

## 2022-01-03 DIAGNOSIS — I2510 Atherosclerotic heart disease of native coronary artery without angina pectoris: Secondary | ICD-10-CM

## 2022-01-03 DIAGNOSIS — I5022 Chronic systolic (congestive) heart failure: Secondary | ICD-10-CM

## 2022-01-06 ENCOUNTER — Institutional Professional Consult (permissible substitution): Payer: Medicare Other | Admitting: Licensed Clinical Social Worker

## 2022-01-06 NOTE — Chronic Care Management (AMB) (Signed)
Chronic Care Management Pharmacy Note  01/03/2022 Name:  Shawn Meza MRN:  [id number] DOB:  Jul 22, 1946  Summary: Patient has transitioned back to receiving medications in bottles instead of adherence packaging.  He reports that he has stopped Rytary for the last 3+ days due to suspected side effects of dry mouth, difficulty breathing at night and constipation. Encouraged him to continue to take Senna Plus 1 or 2 tablets daily as needed for constipation. Will notify Dr [name] that he has stopped Rytary. Next neurology appointment is 04/12/2022. Continue to follow up with [facility] in neuro office for counseling.  Patient is weighing daily and weight has been stable.  Patient reports that referral for ophthalmology was not for the office that the prefers. He was not able to provide me with the name or address of preferred ophthalmologist. I provided a few names and phone numbers of ophthalmologist in [place] that he can contact to see if he would like to see them. He will look to see if he has an paperwork from past visits with preferred ophthalmology office.    Subjective: Shawn Meza is an 76 y.o. year old male who is a primary patient of Shawn Pal, DO.  The CCM team was consulted for assistance with disease management and care coordination needs.    Engaged with patient face to face for follow up visit in response to provider referral for pharmacy case management and/or care coordination services.   Consent to Services:  The patient was given information about Chronic Care Management services, agreed to services, and gave verbal consent prior to initiation of services.  Please see initial visit note for detailed documentation.   Patient Care Team: Shawn Pal, DO as PCP - General (Family Medicine) Shawn Gavia Hilton Cork, MD as PCP - Cardiology (Cardiology) Shawn Boston, MD (General Surgery) Shawn Samples, MD as Consulting Physician (General Surgery) [name], Shawn Quail,  DO as Consulting Physician (Neurology) Shawn Rued, RN as Case Manager Shawn Meza, RPH-CPP (Pharmacist)  Recent office visits: 10/27/2021 - Fam Med (Dr [name]) Well Adult Exam. Referred to dermatology and ophthalmology. Received injection in rigth hip for bursitis today. F/U 6 months.  08/13/21-Shawn Nolene Ebbs, DO (PCP, Video visit) Seen for back pain. Start Voltaren 1% gel and Tizanidine 4 mg. Follow up in 1 month.   Recent consult visits:  11/25/2021 -  [facility] - LCSW, Shawn Taylor-Paladine.  11/03/2021 - [facility] - LCSW, Shawn Taylor-Paladine.  10/19/2021 - Neuro / LCSW - phone call. Started physical therapy. Has not started recommended dance classes. Mood reported to have improved.  10/12/2021 - Neuro / LCSW Shawn Meza) Seen for counseling. 10/08/2021 - Neuro (Dr [name]) F/U Parkinson's Disease. Given Meza of Rytary 167m to take 3 times a day. [facility] Referred for physical therapy.  09/27/21-Shawn JMora Bellman MD (Cardiology) Cardiac follow up visit. Follow up in 6 months. 09/16/21-Shawn EHinton Dyer MD (Sports medicine) Seen for acute on chronic ride sided radicular pain. 08/23/21-Shawn A. Taylor-Paladino, LCSW. (Neurology, Video visit) Seen for depression. Follow up in 4 weeks.  04/08/2022 - Neurology (Dr [name]) Seen for f/u Parkinson's Disease. Continue carbidopa/levodopa 25/100 CR, 2 tablets at 8 AM/2 tablets at noon/1 tablet at 4 PM. Continue carbidopa/levodopa 50/200 CR at bedtime.  Depression -On Trintellix.  Managed by primary care.  Declines psychiatry.  I still think that this is a big issue in patient's life. B12 deficiency -On supplementation; Lumbar spinal stenosis -Following  with Dr. Vertell Limber.  Phone number was given to patient to contact for steroid injections;  Insomnia -Continue melatonin to 3 mg.   Hospital visits:  08/05/2021 - ED Visit at [facility] due to Leg pain.   New?Medications Started at [facility] Discharge:?? predniSONE (DELTASONE) 20 MG tablet  Daily      Lidocaine 4% patch every 12 hours prn Medication Changes at Hospital Discharge: None noted Medications Discontinued at Hospital Discharge: None noted   Objective:  Lab Results  Component Value Date   CREATININE 0.95 10/22/2021   CREATININE 0.98 09/27/2021   CREATININE 0.93 03/25/2021    Lab Results  Component Value Date   HGBA1C 5.7 (H) 12/19/2018   Last diabetic Eye exam: No results found for: "HMDIABEYEEXA"  Last diabetic Foot exam: No results found for: "HMDIABFOOTEX"      Component Value Date/Time   CHOL 160 09/27/2021 1440   TRIG 120 09/27/2021 1440   TRIG 107 05/09/2010 0000   HDL 42 09/27/2021 1440   CHOLHDL 3.8 09/27/2021 1440   CHOLHDL 3.6 05/20/2015 1410   VLDL 27 05/20/2015 1410   LDLCALC 96 09/27/2021 1440       Latest Ref Rng & Units 09/27/2021    2:40 PM 03/25/2021    4:44 PM 08/11/2020    2:19 PM  Hepatic Function  Total Protein 6.0 - 8.5 g/dL 6.7  6.8  6.9   Albumin 3.7 - 4.7 g/dL 4.4  4.2  4.1   AST 0 - 40 IU/L '20  25  23   ' ALT 0 - 44 IU/L '18  20  31   ' Alk Phosphatase 44 - 121 IU/L 72  71  69   Total Bilirubin 0.0 - 1.2 mg/dL 0.4  0.3  0.5     Lab Results  Component Value Date/Time   TSH 1.73 01/22/2020 03:28 PM   TSH 2.010 12/19/2018 11:23 AM       Latest Ref Rng & Units 10/22/2021    9:23 PM 03/30/2020    4:02 PM 03/30/2020    3:54 PM  CBC  WBC 4.0 - 10.5 K/uL 9.0   9.6   Hemoglobin 13.0 - 17.0 g/dL 16.2  15.6  15.6   Hematocrit 39.0 - 52.0 % 48.4  46.0  48.3   Platelets 150 - 400 K/uL 202   189     Lab Results  Component Value Date/Time   VD25OH 28.37 (L) 07/28/2020 01:34 PM   VD25OH 19.92 (L) 01/22/2020 03:28 PM    Clinical ASCVD: Yes  The ASCVD Risk score (Arnett DK, et al., 2019) failed to calculate for the following reasons:   The patient has a prior MI or stroke diagnosis      Social History   Tobacco Use  Smoking  Status Never   Passive exposure: Never  Smokeless Tobacco Never   BP Readings from Last 3 Encounters:  10/27/21 122/80  10/23/21 (!) 157/100  10/08/21 126/83   Pulse Readings from Last 3 Encounters:  10/27/21 79  10/23/21 93  10/08/21 72   Wt Readings from Last 3 Encounters:  10/27/21 206 lb 2 oz (93.5 kg)  10/22/21 206 lb 9.1 oz (93.7 kg)  10/08/21 206 lb 9.6 oz (93.7 kg)    Assessment: Review of patient past medical history, allergies, medications, health status, including review of consultants reports, laboratory and other test data, was performed as part of comprehensive evaluation and provision of chronic care management services.   SDOH:  (Social Determinants of Health)  assessments and interventions performed:    CCM Care Plan  Allergies  Allergen Reactions   Rosuvastatin Other (See Comments)    Whole body aches   Testosterone Other (See Comments)    ABDOMINAL PAIN and cramping   Fluoxetine Other (See Comments)    Caused depression and aggression   Requip [Ropinirole] Nausea Only   Tizanidine Hcl Hives    Medications Reviewed Today     Reviewed by Shawn Meza, RPH-CPP (Pharmacist) on 01/03/22 at 1520  Med List Status: <None>   Medication Order Taking? Sig Documenting Provider Last Dose Status Informant  acetaminophen (TYLENOL) 325 MG tablet [id number] Yes Take 162.5 mg by mouth every 6 (six) hours as needed. [provider] Taking Active   aspirin 81 MG EC tablet [id number] Yes Take 1 tablet (81 mg total) by mouth daily. Shawn Pal, DO Taking Active   Carbidopa-Levodopa ER (RYTARY) 36.25-145 MG CPCR [id number] No Take 1 tablet 3 times a day  Patient not taking: Reported on 01/03/2022   [name], Shawn Quail, DO Not Taking Active   esomeprazole (NEXIUM) 40 MG capsule [id number] Yes Take 1 capsule (40 mg total) by mouth at bedtime. Shawn Pal, DO Taking Active   FLOVENT HFA 110 MCG/ACT inhaler [id number] Yes Inhale 1 puff into the lungs  daily as needed (wheezing, shortness of breath). Shawn Pal, DO Taking Active   fluticasone (FLONASE) 50 MCG/ACT nasal spray [id number] Yes Place 1 spray into both nostrils daily. Blanchie Dessert, MD Taking Active   furosemide (LASIX) 40 MG tablet [id number] Yes Take 0.5 tablets (20 mg total) by mouth every Monday, Wednesday, and Friday.  Patient taking differently: Take 20 mg by mouth every other day.   Richardo Priest, MD Taking Active   gabapentin (NEURONTIN) 100 MG capsule [id number] No Take 1 capsule (100 mg total) by mouth at bedtime.  Patient not taking: Reported on 12/06/2021   Shawn Pal, DO Not Taking Active   metoprolol tartrate (LOPRESSOR) 25 MG tablet [id number] Yes Take 1 tablet (25 mg total) by mouth daily. Richardo Priest, MD Taking Active   nitroGLYCERIN (NITROSTAT) 0.4 MG SL tablet [id number] Yes Place 1 tablet (0.4 mg total) under the tongue every 5 (five) minutes x 3 doses as needed for chest pain. Shawn Pal, DO Taking Active   pravastatin (PRAVACHOL) 20 MG tablet [id number] Yes Take 1 tablet (20 mg total) by mouth daily. Shawn Pal, DO Taking Active   prednisoLONE acetate (PRED FORTE) 1 % ophthalmic suspension [id number]  Place 1 drop into both eyes 2 (two) times daily. [provider]  Active Self  sacubitril-valsartan (ENTRESTO) 24-26 MG [id number] Yes Take 1 tablet by mouth daily. Richardo Priest, MD Taking Active   senna-docusate (SENNA PLUS) 8.6-50 MG tablet [id number] Yes Take 1 tablet by mouth daily as needed for mild constipation. [provider] Taking Active   tiZANidine (ZANAFLEX) 4 MG tablet [id number] No Take 1 tablet (4 mg total) by mouth every 6 (six) hours as needed for muscle spasms.  Patient not taking: Reported on 11/11/2021   Shawn Pal, DO Not Taking Active   vortioxetine HBr (TRINTELLIX) 5 MG TABS tablet [id number] Yes Take 1 tablet (5 mg total) by mouth daily.  Patient taking  differently: Take 2.5 mg by mouth daily.   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   Diarrhea 11/04/2020   Rectal bleeding 11/04/2020   Trochanteric bursitis of right hip    Transient ischemic attack    NSTEMI (non-ST elevated myocardial infarction) ([place])    Nephrolithiasis    Metabolic syndrome    Major depression, chronic    Long-term use of aspirin therapy    Hypercholesterolemia    Hiatal hernia    GERD (gastroesophageal reflux disease)    Essential tremor    Essential hypertension    Erectile dysfunction    Elevated PSA    Colon cancer ([place])    Chronic coronary artery disease    Arthritis    Myofascial pain 12/20/2019   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   Low back pain 10/28/2019   Radiculopathy, lumbar region 10/28/2019   Fatigue 03/47/4259   Chronic systolic (congestive) heart failure ([place]) 09/18/2018   Ischemic cardiomyopathy 09/16/2018   Aortic regurgitation 09/16/2018   Cardiomyopathy, unspecified ([place]) 06/13/2018   Abscess of right axilla 12/12/2017   BMI 33.0-33.9,adult 12/12/2017   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) 01/02/2017   Memory change 07/06/2015   Depression, recurrent ([place]) 07/06/2015   Medial meniscus tear 10/11/2011   Neuroma of foot 10/11/2011   Coronary artery disease involving native coronary artery of native heart with angina pectoris ([place]) 12/28/2010   OTHER TESTICULAR HYPOFUNCTION 05/20/2010   Mixed hyperlipidemia 05/20/2010   Hypertensive heart disease with heart failure  ([place]) 05/20/2010   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 05/20/2010   GERD 05/20/2010   TRANSIENT ISCHEMIC ATTACK, HX OF 05/20/2010   NEPHROLITHIASIS, HX OF 05/20/2010   BENIGN PROSTATIC HYPERTROPHY, HX OF, S/P TURP 05/20/2010    Immunization History  Administered Date(s) Administered   Fluad Quad(high Dose 65+) 05/06/2019   Influenza Split 04/18/2012   Influenza Whole 05/20/2010   Influenza,inj,Quad PF,6+ Mos 06/13/2013, 05/20/2015   Influenza-Unspecified 05/31/2015, 06/29/2021   PFIZER(Purple Top)SARS-COV-2 Vaccination 09/27/2019, 10/22/2019   Pneumococcal Conjugate-13 05/20/2015   Pneumococcal Polysaccharide-23 05/20/2010   Td 08/02/2007    Conditions to be addressed/monitored: CHF, CAD, HTN, HLD, Depression, and Parkinson's disease; chronic back pain  Care Plan : General Pharmacy (Adult)  Updates made by Shawn Meza, RPH-CPP since 01/06/2022 12:00 AM     Problem: Chronic Conditions: Parkinson's Disease; HTN; GERD; CAD; chronic pain;      Long-Range Goal: Provide education, support and care coordination for medication therapy and chronic conditions   Start Date: 10/04/2021  Priority: High  Note:   Current Barriers:  Unable to achieve control of Parkinson's Disease  Does not adhere to prescribed medication regimen Does not maintain contact with provider office  Pharmacist Clinical Goal(s):  Over the next 90 days, patient will achieve adherence to monitoring guidelines and medication adherence to achieve therapeutic efficacy achieve control of Parkinson's Disease as evidenced by improved symptoms and decrease in suspected side effects contact provider office for questions/concerns as evidenced notation of same in electronic health record through collaboration with PharmD and provider.   Interventions: 1:1 collaboration with Shawn Pal, DO regarding development and update of comprehensive plan of care as evidenced by provider attestation and  co-signature Inter-disciplinary care team collaboration (see longitudinal plan  of care) Comprehensive medication review performed; medication list updated in electronic medical record  Parkinson's Disease:  Goal: decrease symptoms while minimizing medication side effects Managed by Dr Wells Guiles [name]  Currently therapy:  Rytary 36.25 / 141m - take 1 tablet three times a day (patient reports he has not taken in last 3 or more days due to causing dry mouth, difficulty breathing and constipation Reviewed side effects patient is reporting. Dry mouth has been reported 1 to 2% with Rytary. Constipation is commonly seen in patients with parkinson's disease. Patient encouraged to continue to use Senna Plus 1 or 2 tablets daily as needed for constipation. Interventions:  Recommended yearly skin checks with dermatology due to risk of melanoma with Parkinson's Diseases / treatment. PCP has made referral Discussed benefits of Rytary Encouraged patient to reach out to neurology to discuss side effect and possible alternatives. I will send message to neurology to make sure Dr [name] is aware that patient is not taking. Next appointment is current set for 04/12/2022.   Hypertension / CHF: Goal: blood pressure < 140/90 while minimizing orthostatic hypotension and management of CHF symptoms while preventing exacerbations / hospitalizations Controlled Current treatment: Furosemide 415m-  take 1 tablet 3 times per week (patient reports he is only taking 0.5 tablet MWF) Metoprolol tartrate 2582m take 1 tablet daily  Entresto 24/71m1mtake 1 tablet daily  Entresto and Metoprolol tartrate dose not optimized but noted that dose was decreased by Dr MunlBettina Gaviaardiologist 11/18/2020 due to orthostatic hypotension.  Patient is checking weight daily. Reports weight has been stable.   Interventions:  Discussed signs and symptoms of CHF exacerbation - weight gain, SOB, abdominal fullness, swelling in legs or abdomen, Fatigue  and weakness, changes in ability to perform usual activities, persistent cough or wheezing with white or pink blood-tinged mucus, nausea and lack of appetite Recommended he weigh daily - report weight gain of more than 3 lbs in 24 hours or 5 lbs in 1 week. Continue current regimen for hypertension and CHF Continue to follow up with Dr MunlBettina Gaviardiologist  Hyperlipidemia / CAD: LDL improved but not at goal of < 70; Triglycerides at goal of < 150  Lab Results  Component Value Date   LDLCALC 96 09/27/2021   LDLCC[place] (H) 03/25/2021   LDLCB[place] (H) 11/18/2020   Lab Results  Component Value Date   TRIG 120 09/27/2021   TRIG 174 (H) 03/25/2021   TRIG 148 11/18/2020  Current treatment: Aspirin 81mg46mly  Pravastatin 20mg 77my  Medications previously tried: ezetimibe-simvastatin (Vytorin) - unsure why stopped, rosuvastatin - stopped 04/24/2014 due to whole body aching, atorvastatin - stopped around 2029 but unsure why, possibly muscle aches  Interventions:  Discussed benefits of statin therapy and that cardiologist's last notes recommended to continue statin therapy Recommended continue aspirin and pravastatin   Depression Improving Current treatment: Trintellix 5mg on56ma day  Connected with Chronic Care Management social worker for mental health support. He also has been meeting with social worker at Dr [name]'s [facility] [employer] recently. Interventions:  Reviewed importance of taking Trintellix every day.  Continue to follow up with clinical social worker in [name]'s [employer].    Medication management Pharmacist Clinical Goal(s): Over the next 90 days, patient will work with PharmD and providers to maintain optimal medication adherence Current pharmacy: [facility] Was using adherence packaging but patient requested change to receiving medications in bottle 10/2021 Interventions Comprehensive medication review performed. Reviewed refill history and assessed adherence Encouraged  patient to take all medications as prescribed. He should communicate medication problems or suspected side effect to either the clinical pharmacist or a medical provider so that adjustments can be made.    Patient Goals/Self-Care Activities Over the next 90 days, patient will:  Focus on medication adherence by filling medications appropriately. Set alarm on phone to remind you to take medications.  Discuss Rytary side effect concerns with Dr [name]. Report any questions or concerns to PharmD and/or provider(s). Is it important for your medical team to be aware if you suspect you are having side effects or taking medications differently.  Weigh daily, and contact provider if weight gain of of more than 3 lbs in 24 hours or 5 lbs in 1 week Constipation is commonly seen in patients with parkinson's disease. Patient encouraged to continue to use Senna Plus 1 or 2 tablets daily as needed for constipation.   Follow Up Plan: Telephone follow up appointment with care management team member scheduled for:  1 to 2 months          Medication Assistance: None required.  Patient affirms current coverage meets needs.  Patient's preferred pharmacy is:  [facility] - [place], [place] - [address] [place] [address] Phone: [phone] Fax: [phone]  Uses pill box? No -   Pt endorses 60% compliance  Follow Up:  Patient agrees to Care Plan and Follow-up.  Plan: Telephone follow up appointment with care management team member scheduled for:  1 to 2 months.   Shawn Meza, PharmD Clinical Pharmacist [facility]

## 2022-01-06 NOTE — Patient Instructions (Signed)
Mr. Guertin It was a pleasure speaking with you  Below is a summary of your health goals and care plan   Patient Goals/Self-Care Activitiesl:  Focus on medication adherence by filling medications appropriately. Set alarm on phone to remind you to take medications.  Discuss Rytary side effect concerns with Dr [name]. Report any questions or concerns to PharmD and/or provider(s). Is it important for your medical team to be aware if you suspect you are having side effects or taking medications differently.  Weigh daily, and contact provider if weight gain of of more than 3 lbs in 24 hours or 5 lbs in 1 week Constipation is commonly seen in patients with parkinson's disease. Patient encouraged to continue to use Senna Plus 1 or 2 tablets daily as needed for constipation.   Follow Up Plan: Telephone follow up appointment with care management team member scheduled for:  1 to 2 months    If you have any questions or concerns, please feel free to contact me either at the phone number below or with a MyChart message.   Keep up the good work!  [name], PharmD Clinical Pharmacist [facility] [place] [facility] [phone] (direct line)  [phone] (main office number)   The patient verbalized understanding of instructions, educational materials, and care plan provided today and DECLINED offer to receive copy of patient instructions, educational materials, and care plan.   Received copy of Chronic Care Management care plan just 2 weeks ago.

## 2022-01-10 ENCOUNTER — Ambulatory Visit
Admission: EM | Admit: 2022-01-10 | Discharge: 2022-01-10 | Disposition: A | Discharge status: Home / Self Care | Payer: Medicare Other | Attending: Internal Medicine | Admitting: Internal Medicine

## 2022-01-10 DIAGNOSIS — J018 Other acute sinusitis: Secondary | ICD-10-CM

## 2022-01-10 MED ORDER — AMOXICILLIN-POT CLAVULANATE 875-125 MG PO TABS
1.0000 | ORAL_TABLET | Freq: Two times a day (BID) | ORAL | 0 refills | Status: DC
Start: 2022-01-10 — End: 2022-03-01

## 2022-01-10 NOTE — Discharge Instructions (Signed)
It appears that you have a sinus infection which is being treated with antibiotic.  Please follow-up if symptoms persist or worsen.

## 2022-01-10 NOTE — ED Triage Notes (Signed)
Patient presents to Urgent Care with complaints of head congestion, decreased hearing, eye swelling and itchiness, dizzy since 3 days ago. Patient reports no otc medications due to pmh of parkinsons.

## 2022-01-10 NOTE — ED Provider Notes (Signed)
[facility]    CSN: [id number] Arrival date & time: 01/10/22  1431      History   Chief Complaint Chief Complaint  Patient presents with   head congestion    HPI Shawn Meza is a 76 y.o. male.   Patient presents with nasal congestion, sinus pressure, dizziness, bilateral ear pain that has been present for approximately 2 to 3 weeks.  Denies any known sick contacts or fever.  Denies any associated cough, chest pain, shortness of breath, sore throat, nausea, vomiting, diarrhea, abdominal pain.  Patient denies history of asthma or COPD.  Patient has not taken any medications to help alleviate symptoms.     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   Diarrhea 11/04/2020   Elevated PSA    Erectile dysfunction    Essential hypertension    Essential tremor    GERD (gastroesophageal reflux disease)    Headache(784.0)    Hiatal hernia    Hypercholesterolemia    Long-term use of aspirin therapy    Low back pain 10/28/2019   Major depression, chronic    Medial meniscus tear 08/06/2692   Metabolic syndrome    Morbid obesity ([place])    Myofascial pain 12/20/2019   Nephrolithiasis    hx of   NSTEMI (non-ST elevated myocardial infarction) ([place])    Parkinson's disease ([place])    S/P CABG (coronary artery bypass graft)    Transient ischemic attack    hx of   Trochanteric bursitis of right hip     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   Diarrhea 11/04/2020   Rectal bleeding 11/04/2020   Trochanteric bursitis of right hip    Transient ischemic attack    NSTEMI (non-ST elevated myocardial infarction) ([place])    Nephrolithiasis    Metabolic syndrome    Major depression, chronic    Long-term use of aspirin therapy    Hypercholesterolemia    Hiatal hernia    GERD  (gastroesophageal reflux disease)    Essential tremor    Essential hypertension    Erectile dysfunction    Elevated PSA    Colon cancer ([place])    Chronic coronary artery disease    Arthritis    Myofascial pain 12/20/2019   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   Low back pain 10/28/2019   Radiculopathy, lumbar region 10/28/2019   Fatigue 85/46/2703   Chronic systolic (congestive) heart failure ([place]) 09/18/2018   Ischemic cardiomyopathy 09/16/2018   Aortic regurgitation 09/16/2018   Cardiomyopathy, unspecified ([place]) 06/13/2018   Abscess of right axilla 12/12/2017   BMI 33.0-33.9,adult 12/12/2017   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   Acute blood loss anemia 10/25/2017   Acute postoperative respiratory insufficiency 10/25/2017   Postoperative delirium 10/25/2017   Dyslipidemia 10/17/2017   SOB (shortness of breath) 03/31/2017   Long term current use of aspirin 03/29/2017   Parkinson's disease ([place]) 01/02/2017   Memory change 07/06/2015   Depression, recurrent ([place]) 07/06/2015   Medial meniscus tear 10/11/2011   Neuroma of foot 10/11/2011   Coronary artery disease involving native coronary artery of native heart with angina pectoris ([place]) 12/28/2010   OTHER TESTICULAR HYPOFUNCTION 05/20/2010   Mixed hyperlipidemia 05/20/2010   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 05/20/2010   GERD 05/20/2010   TRANSIENT ISCHEMIC ATTACK, HX OF 05/20/2010   NEPHROLITHIASIS, HX OF 05/20/2010   BENIGN PROSTATIC HYPERTROPHY, HX OF, S/P TURP 05/20/2010    Past Surgical History:  Procedure Laterality Date   CARDIAC CATHETERIZATION  5/12,1/13   4 stents placed   COLON SURGERY     CORONARY ARTERY BYPASS GRAFT     KNEE ARTHROSCOPY  10/11/2011   Procedure: ARTHROSCOPY KNEE;  Surgeon: Lorn Junes, MD;  Location: [facility];  Service: Orthopedics;  Laterality: Left;  Left Knee Arthroscopy  with Medial and Lateral Partial Menisectomy, Chondroplasty   LEFT HEART CATHETERIZATION WITH CORONARY ANGIOGRAM N/A 08/25/2011   Procedure: LEFT HEART CATHETERIZATION WITH CORONARY ANGIOGRAM;  Surgeon: Burnell Blanks, MD;  Location: [facility] CATH LAB;  Service: Cardiovascular;  Laterality: N/A;   LITHOTRIPSY     STERIOD INJECTION  10/11/2011   Procedure: STEROID INJECTION;  Surgeon: Lorn Junes, MD;  Location: [facility];  Service: Orthopedics;  Laterality: Right;  Steroid Injection Second Toe   TRANSURETHRAL RESECTION OF PROSTATE     URETHRAL DILATION         Home Medications    Prior to Admission medications   Medication Sig Start Date End Date Taking? Authorizing Provider  amoxicillin-clavulanate (AUGMENTIN) 875-125 MG tablet Take 1 tablet by mouth every 12 (twelve) hours. 01/10/22  Yes , Hildred Alamin E, FNP  acetaminophen (TYLENOL) 325 MG tablet Take 162.5 mg by mouth every 6 (six) hours as needed.    [provider]  aspirin 81 MG EC tablet Take 1 tablet (81 mg total) by mouth daily. 08/30/21   [name], DO  Carbidopa-Levodopa ER (RYTARY) 36.25-145 MG CPCR Take 1 tablet 3 times a day Patient not taking: Reported on 01/03/2022 12/07/21   [name], Eustace Quail, DO  esomeprazole (NEXIUM) 40 MG capsule Take 1 capsule (40 mg total) by mouth at bedtime. 09/03/21   Wendling, Crosby Oyster, DO  FLOVENT HFA 110 MCG/ACT inhaler Inhale 1 puff into the lungs daily as needed (wheezing, shortness of breath). 11/22/21   [name], DO  fluticasone (FLONASE) 50 MCG/ACT nasal spray Place 1 spray into both nostrils daily. 10/23/21 01/21/22  Blanchie Dessert, MD  furosemide (LASIX) 40 MG tablet Take 0.5 tablets (20 mg total) by mouth every Monday, Wednesday, and Friday. Patient taking differently: Take 20 mg by mouth every other day. 11/22/21   Richardo Priest, MD  gabapentin (NEURONTIN) 100 MG capsule Take 1 capsule (100 mg total) by mouth at bedtime. Patient not  taking: Reported on 12/06/2021 08/28/20   [name], DO  metoprolol tartrate (LOPRESSOR) 25 MG tablet Take 1 tablet (25 mg total) by mouth daily. 11/22/21   Richardo Priest, MD  nitroGLYCERIN (NITROSTAT) 0.4 MG SL tablet Place 1 tablet (0.4 mg total) under the tongue every 5 (five) minutes x 3 doses as needed for chest pain. 08/28/20   [name], DO  pravastatin (PRAVACHOL) 20 MG tablet Take 1 tablet (20 mg total) by mouth daily. 08/30/21   [name], DO  prednisoLONE acetate (PRED FORTE) 1 % ophthalmic suspension Place 1 drop into both eyes 2 (two) times daily.    [provider]  sacubitril-valsartan (ENTRESTO) 24-26 MG Take 1 tablet by mouth daily. 11/22/21   Richardo Priest, MD  senna-docusate (SENNA PLUS) 8.6-50 MG tablet Take 1 tablet by mouth daily as needed for mild constipation.  [provider]  tiZANidine (ZANAFLEX) 4 MG tablet Take 1 tablet (4 mg total) by mouth every 6 (six) hours as needed for muscle spasms. Patient not taking: Reported on 11/11/2021 08/13/21   [name], DO  vortioxetine HBr (TRINTELLIX) 5 MG TABS tablet Take 1 tablet (5 mg total) by mouth daily. Patient taking differently: Take 2.5 mg by mouth daily. 05/28/21   [name], DO    Family History Family History  Problem Relation Age of Onset   Heart disease Mother    Heart disease Father    Hyperlipidemia Father    Stroke Father    Hyperlipidemia Brother    Heart disease Brother    Coronary artery disease Other        family hx of male 1st degree relative ,50   Hyperlipidemia Other        family hx of   Hypertension Other        family hx of   Arthritis Other        family hx of   Healthy Daughter    Dementia Neg Hx     Social History Social History   Tobacco Use   Smoking status: Never    Passive exposure: Never   Smokeless tobacco: Never  Vaping Use   Vaping Use: Never used  Substance Use Topics   Alcohol use: No    Drug use: No     Allergies   Rosuvastatin, Testosterone, Fluoxetine, Requip [ropinirole], and Tizanidine hcl   Review of Systems Review of Systems Per HPI  Physical Exam Triage Vital Signs ED Triage Vitals  Enc Vitals Group     BP 01/10/22 1555 (!) 113/57     Pulse Rate 01/10/22 1555 96     Resp 01/10/22 1555 18     Temp 01/10/22 1555 97.8 F (36.6 C)     Temp src --      SpO2 01/10/22 1555 96 %     Weight --      Height --      Head Circumference --      Peak Flow --      Pain Score 01/10/22 1554 0     Pain Loc --      Pain Edu? --      Excl. in [place]? --    No data found.  Updated Vital Signs BP (!) 113/57   Pulse 96   Temp 97.8 F (36.6 C)   Resp 18   SpO2 96%   Visual Acuity Right Eye Distance:   Left Eye Distance:   Bilateral Distance:    Right Eye Near:   Left Eye Near:    Bilateral Near:     Physical Exam Constitutional:      General: He is not in acute distress.    Appearance: Normal appearance. He is not toxic-appearing or diaphoretic.  HENT:     Head: Normocephalic and atraumatic.     Right Ear: Ear canal normal. A middle ear effusion is present. Tympanic membrane is not perforated, erythematous or bulging.     Left Ear: Ear canal normal. A middle ear effusion is present. Tympanic membrane is not perforated, erythematous or bulging.     Nose: Congestion present.     Mouth/Throat:     Mouth: Mucous membranes are moist.     Pharynx: No posterior oropharyngeal erythema.  Eyes:     Extraocular Movements: Extraocular movements intact.     Conjunctiva/sclera: Conjunctivae normal.  Pupils: Pupils are equal, round, and reactive to light.  Cardiovascular:     Rate and Rhythm: Normal rate and regular rhythm.     Pulses: Normal pulses.     Heart sounds: Normal heart sounds.  Pulmonary:     Effort: Pulmonary effort is normal. No respiratory distress.     Breath sounds: Normal breath sounds. No stridor. No wheezing, rhonchi or rales.   Abdominal:     General: Abdomen is flat. Bowel sounds are normal.     Palpations: Abdomen is soft.  Musculoskeletal:        General: Normal range of motion.     Cervical back: Normal range of motion.  Skin:    General: Skin is warm and dry.  Neurological:     General: No focal deficit present.     Mental Status: He is alert and oriented to person, place, and time. Mental status is at baseline.  Psychiatric:        Mood and Affect: Mood normal.        Behavior: Behavior normal.        Thought Content: Thought content normal.        Judgment: Judgment normal.      UC Treatments / Results  Labs (all labs ordered are listed, but only abnormal results are displayed) Labs Reviewed - No data to display  EKG   Radiology No results found.  Procedures Procedures (including critical care time)  Medications Ordered in UC Medications - No data to display  Initial Impression / Assessment and Plan / UC Course  I have reviewed the triage vital signs and the nursing notes.  Pertinent labs & imaging results that were available during my care of the patient were reviewed by me and considered in my medical decision making (see chart for details).     Physical exam is consistent with acute sinus infection.  Do not think that chest imaging is necessary given no obvious cough or adventitious lung sounds on exam.  Suspect dizziness is related to bilateral middle ear effusion.  Will treat all of this with Augmentin antibiotic.  Patient was given strict return precautions.  Do not think that viral testing is necessary given duration of symptoms.  Patient is nontoxic-appearing and does not appear to be in need of emergent evaluation at the hospital at this time.  Patient verbalized understanding and was agreeable with plan. Final Clinical Impressions(s) / UC Diagnoses   Final diagnoses:  Acute non-recurrent sinusitis of other sinus     Discharge Instructions      It appears that you  have a sinus infection which is being treated with antibiotic.  Please follow-up if symptoms persist or worsen.    ED Prescriptions     Medication Sig Dispense Auth. Provider   amoxicillin-clavulanate (AUGMENTIN) 875-125 MG tablet Take 1 tablet by mouth every 12 (twelve) hours. 14 tablet [place], Michele Rockers, [place]      PDMP not reviewed this encounter.   Teodora Medici, [place] 01/10/22 1700

## 2022-01-28 DIAGNOSIS — E785 Hyperlipidemia, unspecified: Secondary | ICD-10-CM

## 2022-01-28 DIAGNOSIS — G20 Parkinson's disease: Secondary | ICD-10-CM

## 2022-01-28 DIAGNOSIS — I509 Heart failure, unspecified: Secondary | ICD-10-CM

## 2022-01-28 DIAGNOSIS — I110 Hypertensive heart disease with heart failure: Secondary | ICD-10-CM

## 2022-01-28 DIAGNOSIS — I2510 Atherosclerotic heart disease of native coronary artery without angina pectoris: Secondary | ICD-10-CM

## 2022-01-28 DIAGNOSIS — F32A Depression, unspecified: Secondary | ICD-10-CM

## 2022-01-31 ENCOUNTER — Telehealth: Payer: Self-pay

## 2022-01-31 NOTE — Telephone Encounter (Signed)
  Care Management   Follow Up Note   01/31/2022 Name: Shawn Meza MRN: [id number] DOB: [birth year]   Referred by: [name], DO Reason for referral : No chief complaint on file.   An unsuccessful telephone outreach was attempted today. The patient was referred to the case management team for assistance with care management and care coordination.   Follow Up Plan: The care management team will reach out to the patient again over the next 30 days.   Thea Silversmith, RN, MSN, BSN, CCM Care Management Coordinator [facility] [phone]

## 2022-02-04 ENCOUNTER — Ambulatory Visit (INDEPENDENT_AMBULATORY_CARE_PROVIDER_SITE_OTHER): Payer: Medicare Other

## 2022-02-04 DIAGNOSIS — G20 Parkinson's disease: Secondary | ICD-10-CM

## 2022-02-04 DIAGNOSIS — F339 Major depressive disorder, recurrent, unspecified: Secondary | ICD-10-CM

## 2022-02-04 NOTE — Chronic Care Management (AMB) (Signed)
Chronic Care Management   CCM RN Visit Note  02/04/2022 Name: Shawn Meza MRN: [id number] DOB: August 18, 1945  Subjective: Shawn Meza is a 76 y.o. year old male who is a primary care patient of [name], DO. The care management team was consulted for assistance with disease management and care coordination needs.    Engaged with patient by telephone for follow up visit in response to provider referral for case management and/or care coordination services.   Consent to Services:  The patient was given information about Chronic Care Management services, agreed to services, and gave verbal consent prior to initiation of services.  Please see initial visit note for detailed documentation.   Patient agreed to services and verbal consent obtained.   Assessment: Review of patient past medical history, allergies, medications, health status, including review of consultants reports, laboratory and other test data, was performed as part of comprehensive evaluation and provision of chronic care management services.   SDOH (Social Determinants of Health) assessments and interventions performed:    CCM Care Plan  Allergies  Allergen Reactions   Rosuvastatin Other (See Comments)    Whole body aches   Testosterone Other (See Comments)    ABDOMINAL PAIN and cramping   Fluoxetine Other (See Comments)    Caused depression and aggression   Requip [Ropinirole] Nausea Only   Tizanidine Hcl Hives    Outpatient Encounter Medications as of 02/04/2022  Medication Sig   acetaminophen (TYLENOL) 325 MG tablet Take 162.5 mg by mouth every 6 (six) hours as needed.   aspirin 81 MG EC tablet Take 1 tablet (81 mg total) by mouth daily.   Carbidopa-Levodopa ER (RYTARY) 36.25-145 MG CPCR Take 1 tablet 3 times a day   esomeprazole (NEXIUM) 40 MG capsule Take 1 capsule (40 mg total) by mouth at bedtime.   FLOVENT HFA 110 MCG/ACT inhaler Inhale 1 puff into the lungs daily as needed (wheezing,  shortness of breath).   furosemide (LASIX) 40 MG tablet Take 0.5 tablets (20 mg total) by mouth every Monday, Wednesday, and Friday. (Patient taking differently: Take 20 mg by mouth every other day.)   metoprolol tartrate (LOPRESSOR) 25 MG tablet Take 1 tablet (25 mg total) by mouth daily.   nitroGLYCERIN (NITROSTAT) 0.4 MG SL tablet Place 1 tablet (0.4 mg total) under the tongue every 5 (five) minutes x 3 doses as needed for chest pain.   pravastatin (PRAVACHOL) 20 MG tablet Take 1 tablet (20 mg total) by mouth daily.   prednisoLONE acetate (PRED FORTE) 1 % ophthalmic suspension Place 1 drop into both eyes 2 (two) times daily.   sacubitril-valsartan (ENTRESTO) 24-26 MG Take 1 tablet by mouth daily.   senna-docusate (SENNA PLUS) 8.6-50 MG tablet Take 1 tablet by mouth daily as needed for mild constipation.   vortioxetine HBr (TRINTELLIX) 5 MG TABS tablet Take 1 tablet (5 mg total) by mouth daily. (Patient taking differently: Take 2.5 mg by mouth daily.)   amoxicillin-clavulanate (AUGMENTIN) 875-125 MG tablet Take 1 tablet by mouth every 12 (twelve) hours. (Patient not taking: Reported on 02/04/2022)   fluticasone (FLONASE) 50 MCG/ACT nasal spray Place 1 spray into both nostrils daily.   gabapentin (NEURONTIN) 100 MG capsule Take 1 capsule (100 mg total) by mouth at bedtime. (Patient not taking: Reported on 12/06/2021)   tiZANidine (ZANAFLEX) 4 MG tablet Take 1 tablet (4 mg total) by mouth every 6 (six) hours as needed for muscle spasms. (Patient not taking: Reported on 11/11/2021)   No facility-administered  encounter medications on file as of 02/04/2022.    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   Diarrhea 11/04/2020   Rectal bleeding 11/04/2020   Trochanteric bursitis of right hip    Transient ischemic attack    NSTEMI (non-ST elevated myocardial infarction) ([place])    Nephrolithiasis    Metabolic syndrome    Major depression, chronic    Long-term use  of aspirin therapy    Hypercholesterolemia    Hiatal hernia    GERD (gastroesophageal reflux disease)    Essential tremor    Essential hypertension    Erectile dysfunction    Elevated PSA    Colon cancer (HCC)    Chronic coronary artery disease    Arthritis    Myofascial pain 12/20/2019   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   Low back pain 10/28/2019   Radiculopathy, lumbar region 10/28/2019   Fatigue 05/17/5101   Chronic systolic (congestive) heart failure ([place]) 09/18/2018   Ischemic cardiomyopathy 09/16/2018   Aortic regurgitation 09/16/2018   Cardiomyopathy, unspecified ([place]) 06/13/2018   Abscess of right axilla 12/12/2017   BMI 33.0-33.9,adult 12/12/2017   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   Acute blood loss anemia 10/25/2017   Acute postoperative respiratory insufficiency 10/25/2017   Postoperative delirium 10/25/2017   Dyslipidemia 10/17/2017   SOB (shortness of breath) 03/31/2017   Long term current use of aspirin 03/29/2017   Parkinson's disease ([place]) 01/02/2017   Memory change 07/06/2015   Depression, recurrent ([place]) 07/06/2015   Medial meniscus tear 10/11/2011   Neuroma of foot 10/11/2011   Coronary artery disease involving native coronary artery of native heart with angina pectoris ([place]) 12/28/2010   OTHER TESTICULAR HYPOFUNCTION 05/20/2010   Mixed hyperlipidemia 05/20/2010   Hypertensive heart disease with heart failure ([place]) 05/20/2010   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 05/20/2010   GERD 05/20/2010   TRANSIENT ISCHEMIC ATTACK, HX OF 05/20/2010   NEPHROLITHIASIS, HX OF 05/20/2010   BENIGN PROSTATIC HYPERTROPHY, HX OF, S/P TURP 05/20/2010    Conditions to be addressed/monitored: Chronic Pain, Parkinson's disease, Depression  Care Plan : RN Case Manager Plan of Care  Updates made by Luretha Rued, RN since 02/04/2022 12:00 AM  Completed 02/04/2022   Problem: Chronic Disease Managment Education and/or Care Coordination  needs Resolved 02/04/2022  Priority: High     Long-Range Goal: Development of plan of care for Chronic Disease Management and/or care coordination needs Completed 02/04/2022  Start Date: 09/23/2021  Expected End Date: 03/23/2022  Priority: High  Note:   Current Barriers: 02/04/22 Mr. Skellenger reports reports ex-wife is now living in the home and "has been better in ways". He missed appointment with LCSW therapist and reports will call to reschedule. He reports a 5 on the pain scale today, but reports his pain will go down to "3" on occasion. He reports he has frequently gone to a chiropractor in the past and plans to return. He reports chronic pain, currently does not prevent activities of daily living, but limits amount of walking he can do. He reports plans to attend chiropractor care next week. Care Coordination goal met. Discussed Case disclosure. Patient in agreement. Knowledge Deficits related to plan of care for management of Chronic Pain and Movement Disorder Parkinson's Disease  Care Coordination needs related to Transportation and Medication Reconciliation and Medication management  PHQ2 6 PHQ9 -18. Currently being followed by LCSW Althea Charon for Psychotherapeutic services/neurology office  Limited social support  RNCM Clinical Goal(s):  Patient will verbalize understanding of plan for management of Chronic Pain, Movement Disorder Parkinson's Disease and Mental Health Concerns as evidenced by self report and/or chart notation take all medications exactly as prescribed and will call provider for medication related questions as evidenced by self report and/or chart notation    through collaboration with RN Care manager, provider, and care team.   Interventions: 1:1 collaboration with primary care provider regarding development and update of comprehensive plan of care as evidenced by provider attestation and co-signature Inter-disciplinary care team collaboration (see longitudinal plan  of care) Evaluation of current treatment plan related to  self management and patient's adherence to plan as established by provider  Depression Interventions:  (Status:  Goal Met.)  Long Term Goal Currently active with LCSW Misty at neurologist office. Patient missed last scheduled appointment. States he will call to reschedule appointment with her and will continue to work with her. RNCM encouraged patient to continue to work with LCSW Encouraged to continue to explore other social activities or engage in activities he enjoys   Pain Interventions:  (Status:  Goal Met.) Long Term Goal Pain better since stopped physical therapy. Reports chronic pain, currently does not prevent activities of daily living, but limits amount of walking he can do. He reports plans to attend chiropractor care next week.  Medications reviewed and encouraged to take as prescribed Discussed importance of adherence to all scheduled medical appointments Counseled on the importance of reporting any/all new or changed pain symptoms or management strategies to pain management provider Discussed alternative pain relief strategies: such as Turmeric and Ginger Tea, epsom salt soak, warmth/heat packs, icy hot     Movement Disorder/Parkinson's Disease Interventions (Status:  Goal Met.)  Long Term Goal Attend neurology visits as scheduled, takes medications as prescribed. Reports condition not limiting ADL's at this time.  Evaluation of current treatment plan related to  Parkinson's Disease, Chronic Pain , Transportation self-management and patient's adherence to plan as established by provider Encouraged to continue to take medications as prescribed Encouraged to remain active.   Patient Goals/Self-Care Activities: Take medications as prescribed   Attend all scheduled provider appointments Call provider office for new concerns or questions  Continue to to explore other social activities or engage in activities you enjoys.   Contact your nurse case manager for care management and/or care coordination needs Review educational material provided re: sciatica and low back pain. Contact RNCM if you have any questions   Plan:No further follow up required: Patient to contact Primary Care Provider is care management and/or care coordination needs in the future  Thea Silversmith, RN, MSN, BSN, CCM Care Management Coordinator [facility] [employer] [phone]

## 2022-02-04 NOTE — Patient Instructions (Signed)
Visit Information  Thank you for allowing me to share the care management and care coordination services that are available to you as part of your health plan and services through your primary care provider and medical home. Please reach out to your primary care provider or me at [phone] if the care management/care coordination team may be of assistance to you in the future.   Olamide Carattini, RN, MSN, BSN, CCM Care Management Coordinator [facility] [phone]  

## 2022-02-07 ENCOUNTER — Telehealth: Payer: Medicare Other

## 2022-02-18 DIAGNOSIS — H905 Unspecified sensorineural hearing loss: Secondary | ICD-10-CM | POA: Diagnosis not present

## 2022-02-21 ENCOUNTER — Other Ambulatory Visit: Payer: Self-pay | Admitting: Cardiology

## 2022-02-22 DIAGNOSIS — H2513 Age-related nuclear cataract, bilateral: Secondary | ICD-10-CM | POA: Diagnosis not present

## 2022-02-22 DIAGNOSIS — H04123 Dry eye syndrome of bilateral lacrimal glands: Secondary | ICD-10-CM | POA: Diagnosis not present

## 2022-02-22 DIAGNOSIS — H25013 Cortical age-related cataract, bilateral: Secondary | ICD-10-CM | POA: Diagnosis not present

## 2022-02-28 DIAGNOSIS — F339 Major depressive disorder, recurrent, unspecified: Secondary | ICD-10-CM

## 2022-03-01 ENCOUNTER — Other Ambulatory Visit: Payer: Self-pay | Admitting: Family Medicine

## 2022-03-01 ENCOUNTER — Ambulatory Visit (INDEPENDENT_AMBULATORY_CARE_PROVIDER_SITE_OTHER): Payer: Medicare Other | Admitting: Pharmacist

## 2022-03-01 DIAGNOSIS — G20A1 Parkinson's disease without dyskinesia, without mention of fluctuations: Secondary | ICD-10-CM

## 2022-03-01 DIAGNOSIS — E782 Mixed hyperlipidemia: Secondary | ICD-10-CM

## 2022-03-01 DIAGNOSIS — I5022 Chronic systolic (congestive) heart failure: Secondary | ICD-10-CM

## 2022-03-01 DIAGNOSIS — F339 Major depressive disorder, recurrent, unspecified: Secondary | ICD-10-CM

## 2022-03-01 DIAGNOSIS — I2510 Atherosclerotic heart disease of native coronary artery without angina pectoris: Secondary | ICD-10-CM

## 2022-03-01 DIAGNOSIS — G20 Parkinson's disease: Secondary | ICD-10-CM

## 2022-03-01 MED ORDER — PRAVASTATIN SODIUM 20 MG PO TABS: 20.0000 mg | ORAL_TABLET | Freq: Every day | ORAL | 1 refills | Status: DC

## 2022-03-01 NOTE — Chronic Care Management (AMB) (Signed)
Chronic Care Management Pharmacy Note  03/01/2022 Name:  Shawn Meza MRN:  [id number] DOB:  Jan 01, 1946  Summary: Medication Management: Patient is planning to transition to [place] which is closer to his home.  Called [facility] to make sure they had all medications transferred from previous pharmacy.  Parkinson's Disease: He reports that he has restarted Rytary but is only taking 2 times a day (prescribed for 3 times a day). He reports he has tolerated better over the last few week. Next neurology appointment is 04/12/2022. Depression: Patient is taking Trintellix 2.21m daily. He has been prescribed 56mdaily but reports that 58m2mose causes dizziness and drowsiness. However patient states today the he does not feel that Trintellix is working. His ex wife had returned for about 3 or 4 months but he states she has decided to leave again and patient reports increase depressive symptoms. He would like a referral to psychiatry or counselor. He also would like to consider alternative to Trintellix. He has taken escitalopram 58mg63m/2020 - unsure why stopped / noted as patient preference. Also tried fluoxetine but caused aggression / worsening depression. Will discuss referral and alternative therapy with PCP. Consider alternative therapy for depression - sertraline 258mg38mly or SNRI like -  duloxetine 20mg 51my or venlafaxine ER 37.58mg da104m.  Patient is weighing daily and weight has been stable.  Patient did see ophthalmologist. He has gotten new glasses and was prescribed pred-forte eye drops.   Subjective: Shawn Meza y.o.47ear old male who is a primary patient of WendlinShelda PalThe CCM team was consulted for assistance with disease management and care coordination needs.    Engaged with patient by telephone for follow up visit in response to provider referral for pharmacy case management and/or care coordination services.   Consent to Services:  The  patient was given information about Chronic Care Management services, agreed to services, and gave verbal consent prior to initiation of services.  Please see initial visit note for detailed documentation.   Patient Care Team: [name] PCP - General (Family Medicine) [name],[name] Cork PCP - Cardiology (Cardiology) Gross, Michael [facility] Surgery) Morgan,[name] Consulting Physician (General Surgery) [name], [name] Consulting Physician (Neurology) Acquanetta Cabanilla,Cherre RobinsPP (Pharmacist)  Recent office visits: 10/27/2021 - Fam Med (Dr WendlinNani RavensAdult Exam. Referred to dermatology and ophthalmology. Received injection in rigth hip for bursitis today. F/U 6 months.   Recent consult visits:  11/25/2021 -  [facility] - LCSW, [name].  11/03/2021 - [facility] - LCSW, [name].  10/19/2021 - Neuro / LCSW - phone call. Started physical therapy. Has not started recommended dance classes. Mood reported to have improved.  10/12/2021 - Neuro / LCSW (TaylorUlyses Jarredfor counseling. 10/08/2021 - Neuro (Dr [name]) F/U Parkinson's Disease. Given samples of Rytary 1458mg to74me 3 times a day. [facility]. Referred for physical therapy.  09/27/21-Brian J. MunleMora Bellmanrdiology) Cardiac follow up visit. Follow up in 6 months. 09/16/21-[name] medicine) Seen for acute on chronic ride sided radicular pain.    Hospital visits:  01/10/2022 - ED Visit for acute sinusitis. Prescribed Augmentin 8758mg eve51m2 hours.     Objective:  Lab Results  Component Value Date   CREATININE 0.95 10/22/2021   CREATININE 0.98 09/27/2021   CREATININE 0.93 03/25/2021  Lab Results  Component Value Date   HGBA1C 5.7 (H) 12/19/2018   Last diabetic Eye exam: No results found for: "HMDIABEYEEXA"  Last diabetic Foot exam: No results found for: "HMDIABFOOTEX"       Component Value Date/Time   CHOL 160 09/27/2021 1440   TRIG 120 09/27/2021 1440   TRIG 107 05/09/2010 0000   HDL 42 09/27/2021 1440   CHOLHDL 3.8 09/27/2021 1440   CHOLHDL 3.6 05/20/2015 1410   VLDL 27 05/20/2015 1410   LDLCALC 96 09/27/2021 1440       Latest Ref Rng & Units 09/27/2021    2:40 PM 03/25/2021    4:44 PM 08/11/2020    2:19 PM  Hepatic Function  Total Protein 6.0 - 8.5 g/dL 6.7  6.8  6.9   Albumin 3.7 - 4.7 g/dL 4.4  4.2  4.1   AST 0 - 40 IU/L _0 ALT 0 - 44 IU/L _1 Alk Phosphatase 44 - 121 IU/L 72  71  69   Total Bilirubin 0.0 - 1.2 mg/dL 0.4  0.3  0.5     Lab Results  Component Value Date/Time   TSH 1.73 01/22/2020 03:28 PM   TSH 2.010 12/19/2018 11:23 AM       Latest Ref Rng & Units 10/22/2021    9:23 PM 03/30/2020    4:02 PM 03/30/2020    3:54 PM  CBC  WBC 4.0 - 10.5 K/uL 9.0   9.6   Hemoglobin 13.0 - 17.0 g/dL 16.2  15.6  15.6   Hematocrit 39.0 - 52.0 % 48.4  46.0  48.3   Platelets 150 - 400 K/uL 202   189     Lab Results  Component Value Date/Time   VD25OH 28.37 (L) 07/28/2020 01:34 PM   VD25OH 19.92 (L) 01/22/2020 03:28 PM    Clinical ASCVD: Yes  The ASCVD Risk score (Arnett DK, et al., 2019) failed to calculate for the following reasons:   The patient has a prior MI or stroke diagnosis      Social History   Tobacco Use  Smoking Status Never   Passive exposure: Never  Smokeless Tobacco Never   BP Readings from Last 3 Encounters:  01/10/22 (!) 113/57  10/27/21 122/80  10/23/21 (!) 157/100   Pulse Readings from Last 3 Encounters:  01/10/22 96  10/27/21 79  10/23/21 93   Wt Readings from Last 3 Encounters:  10/27/21 206 lb 2 oz (93.5 kg)  10/22/21 206 lb 9.1 oz (93.7 kg)  10/08/21 206 lb 9.6 oz (93.7 kg)    Assessment: Review of patient past medical history, allergies, medications, health status, including review of consultants reports, laboratory and other test data, was performed as part of comprehensive  evaluation and provision of chronic care management services.   SDOH:  (Social Determinants of Health) assessments and interventions performed:  SDOH Interventions    Flowsheet Row Most Recent Value  SDOH Interventions   Depression Interventions/Treatment  Counseling, Referral to Psychiatry, Currently on Treatment       CCM Care Plan  Allergies  Allergen Reactions   Rosuvastatin Other (See Comments)    Whole body aches   Testosterone Other (See Comments)    ABDOMINAL PAIN and cramping   Fluoxetine Other (See Comments)    Caused depression and aggression   Requip [Ropinirole] Nausea Only   Tizanidine Hcl Hives    Medications Reviewed Today     Reviewed by  [name], RPH-CPP (Pharmacist) on 03/01/22 at [facility] List Status: <None>   Medication Order Taking? Sig Documenting Provider Last Dose Status Informant  acetaminophen (TYLENOL) 325 MG tablet [id number]  Take 162.5 mg by mouth every 6 (six) hours as needed. [provider]  Active   aspirin 81 MG EC tablet [id number] Yes Take 1 tablet (81 mg total) by mouth daily. [name], DO Taking Active   Carbidopa-Levodopa ER (RYTARY) 36.25-145 MG CPCR [id number] Yes Take 1 tablet 3 times a day  Patient taking differently: Take 1 tablet by mouth in the morning and at bedtime. Take 1 tablet 3 times a day   [name], Rebecca S, DO Taking Active   esomeprazole (NEXIUM) 40 MG capsule [id number] Yes Take 1 capsule (40 mg total) by mouth at bedtime. [name], DO Taking Active   FLOVENT HFA 110 MCG/ACT inhaler [id number]  Inhale 1 puff into the lungs daily as needed (wheezing, shortness of breath). [name], DO  Active   fluticasone [facility]) 50 MCG/ACT nasal spray [id number]  Place 1 spray into both nostrils daily. Blanchie Dessert, MD  Expired 01/21/22 2359   furosemide (LASIX) 40 MG tablet [id number] Yes TAKE 1/2 TABLET BY MOUTH weekly on monday, wednesday, and friday [name], Brian J, MD Taking  Active   gabapentin (NEURONTIN) 100 MG capsule [id number] No Take 1 capsule (100 mg total) by mouth at bedtime.  Patient not taking: Reported on 12/06/2021   [name], DO Not Taking Active   metoprolol tartrate (LOPRESSOR) 25 MG tablet [id number] Yes Take 1 tablet (25 mg total) by mouth daily. Richardo Priest, MD Taking Active   nitroGLYCERIN (NITROSTAT) 0.4 MG SL tablet [id number] Yes Place 1 tablet (0.4 mg total) under the tongue every 5 (five) minutes x 3 doses as needed for chest pain. [name], DO Taking Active   pravastatin (PRAVACHOL) 20 MG tablet [id number] Yes Take 1 tablet (20 mg total) by mouth daily. [name], DO Taking Active   prednisoLONE acetate (PRED FORTE) 1 % ophthalmic suspension [id number]  Place 1 drop into both eyes 2 (two) times daily. [provider]  Active Self  sacubitril-valsartan (ENTRESTO) 24-26 MG [id number] Yes Take 1 tablet by mouth daily. Richardo Priest, MD Taking Active   senna-docusate (SENNA PLUS) 8.6-50 MG tablet [id number] Yes Take 1 tablet by mouth daily as needed for mild constipation. [provider] Taking Active   tiZANidine (ZANAFLEX) 4 MG tablet [id number] No Take 1 tablet (4 mg total) by mouth every 6 (six) hours as needed for muscle spasms.  Patient not taking: Reported on 11/11/2021   [name], DO Not Taking Active   vortioxetine HBr (TRINTELLIX) 5 MG TABS tablet [id number] Yes Take 1 tablet (5 mg total) by mouth daily.  Patient taking differently: Take 2.5 mg by mouth daily.   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   Diarrhea 11/04/2020   Rectal bleeding 11/04/2020   Trochanteric bursitis of right hip    Transient ischemic attack    NSTEMI (non-ST elevated myocardial infarction) ([place])    Nephrolithiasis     Metabolic syndrome    Major depression, chronic  Long-term use of aspirin therapy    Hypercholesterolemia    Hiatal hernia    GERD (gastroesophageal reflux disease)    Essential tremor    Essential hypertension    Erectile dysfunction    Elevated PSA    Colon cancer (HCC)    Chronic coronary artery disease    Arthritis    Myofascial pain 12/20/2019   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   Low back pain 10/28/2019   Radiculopathy, lumbar region 10/28/2019   Fatigue 66/12/3014   Chronic systolic (congestive) heart failure ([place]) 09/18/2018   Ischemic cardiomyopathy 09/16/2018   Aortic regurgitation 09/16/2018   Cardiomyopathy, unspecified ([place]) 06/13/2018   Abscess of right axilla 12/12/2017   BMI 33.0-33.9,adult 12/12/2017   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   Acute blood loss anemia 10/25/2017   Acute postoperative respiratory insufficiency 10/25/2017   Postoperative delirium 10/25/2017   Dyslipidemia 10/17/2017   SOB (shortness of breath) 03/31/2017   Long term current use of aspirin 03/29/2017   Parkinson's disease ([place]) 01/02/2017   Memory change 07/06/2015   Depression, recurrent ([place]) 07/06/2015   Medial meniscus tear 10/11/2011   Neuroma of foot 10/11/2011   Coronary artery disease involving native coronary artery of native heart with angina pectoris ([place]) 12/28/2010   OTHER TESTICULAR HYPOFUNCTION 05/20/2010   Mixed hyperlipidemia 05/20/2010   Hypertensive heart disease with heart failure ([place]) 05/20/2010   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 05/20/2010   GERD 05/20/2010   TRANSIENT ISCHEMIC ATTACK, HX OF 05/20/2010   NEPHROLITHIASIS, HX OF 05/20/2010   BENIGN PROSTATIC HYPERTROPHY, HX OF, S/P TURP 05/20/2010    Immunization History  Administered Date(s) Administered   Fluad Quad(high Dose 65+) 05/06/2019   Influenza Split 04/18/2012   Influenza Whole 05/20/2010   Influenza,inj,Quad PF,6+ Mos 06/13/2013, 05/20/2015    Influenza-Unspecified 05/31/2015, 06/29/2021   PFIZER(Purple Top)SARS-COV-2 Vaccination 09/27/2019, 10/22/2019   Pneumococcal Conjugate-13 05/20/2015   Pneumococcal Polysaccharide-23 05/20/2010   Td 08/02/2007    Conditions to be addressed/monitored: CHF, CAD, HTN, HLD, Depression, and Parkinson's disease; chronic back pain  Care Plan : General Pharmacy (Adult)  Updates made by [name], RPH-CPP since 03/01/2022 12:00 AM     Problem: Chronic Conditions: Parkinson's Disease; HTN; GERD; CAD; chronic pain;      Long-Range Goal: Provide education, support and care coordination for medication therapy and chronic conditions   Start Date: 10/04/2021  Priority: High  Note:   Current Barriers:  Unable to achieve control of Parkinson's Disease  Does not adhere to prescribed medication regimen Does not maintain contact with provider office  Pharmacist Clinical Goal(s):  Over the next 90 days, patient will achieve adherence to monitoring guidelines and medication adherence to achieve therapeutic efficacy achieve control of Parkinson's Disease as evidenced by improved symptoms and decrease in suspected side effects contact provider office for questions/concerns as evidenced notation of same in electronic health record through collaboration with PharmD and provider.   Interventions: 1:1 collaboration with [name], DO regarding development and update of comprehensive plan of care as evidenced by provider attestation and co-signature Inter-disciplinary care team collaboration (see longitudinal plan of care) Comprehensive medication review performed; medication list updated in electronic medical record  Parkinson's Disease:  Goal: decrease symptoms while minimizing medication side effects Managed by Dr Wells Guiles [name]  Currently therapy:  Rytary 36.25 / $Remov'145mg'HsmAJV$  - take 1 tablet three times a day (restarted but is only taking 2 times a day) Reviewed side effects patient is reporting.  Dry mouth has  been reported 1 to 2% with Rytary. Constipation is commonly seen in patients with parkinson's disease. Patient encouraged to continue to use Senna Plus 1 or 2 tablets daily as needed for constipation. Interventions:  Recommended yearly skin checks with dermatology due to risk of melanoma with Parkinson's Diseases / treatment. PCP has made referral Discussed benefits of Rytary. Continue to take Rytary twice a day since is able to tolerate. Will see Dr Carles Collet 04/12/2022.    Hypertension / CHF: Goal: blood pressure < 140/90 while minimizing orthostatic hypotension and management of CHF symptoms while preventing exacerbations / hospitalizations Controlled Current treatment: Furosemide 40mg  -  take 1 tablet 3 times per week (patient reports he is only taking 0.5 tablet MWF) Metoprolol tartrate 25mg  - take 1 tablet daily  Entresto 24/26mg  - take 1 tablet daily  Entresto and Metoprolol tartrate dose not optimized but noted that dose was decreased by Dr [name] / cardiologist 11/18/2020 due to orthostatic hypotension.  Patient is checking weight daily. Reports weight has been stable.   Interventions:  Discussed signs and symptoms of CHF exacerbation - weight gain, SOB, abdominal fullness, swelling in legs or abdomen, Fatigue and weakness, changes in ability to perform usual activities, persistent cough or wheezing with white or pink blood-tinged mucus, nausea and lack of appetite Recommended he weigh daily - report weight gain of more than 3 lbs in 24 hours or 5 lbs in 1 week. Continue current regimen for hypertension and CHF Continue to follow up with Dr [name], cardiologist  Hyperlipidemia / CAD: LDL improved but not at goal of < 70; Triglycerides at goal of < 150  Lab Results  Component Value Date   LDLCALC 96 09/27/2021   [place] 125 (H) 03/25/2021   [place] 125 (H) 11/18/2020   Lab Results  Component Value Date   TRIG 120 09/27/2021   TRIG 174 (H) 03/25/2021   TRIG 148  11/18/2020  Current treatment: Aspirin 81mg  daily  Pravastatin 20mg  daily  Medications previously tried: ezetimibe-simvastatin (Vytorin) - unsure why stopped, rosuvastatin - stopped 04/24/2014 due to whole body aching, atorvastatin - stopped around 2029 but unsure why, possibly muscle aches  Interventions:  Discussed benefits of statin therapy and that cardiologist's last notes recommended to continue statin therapy Recommended continue aspirin and pravastatin   Depression Worsening due to recent home life changes.  Current treatment: Trintellix 5mg  once a day (patient is taking 0.5 tablet daily) Previous medications tried: escitalopram 5mg  - unsure why stopped, patient preference; fluoxetine - worsened depression and aggression.  Previously connected with Chronic Care Management social worker for mental health support but [facility] social worker has closed case. He has not seen Education officer, museum with neurology office in [place].  Interventions:  Consulting with PCP about change in therapy to possibly duloxetine or sertraline Referral to psychologist, psychiatrist or follow up with clinical social worker in Dr Doristine Devoid office.    Medication management Pharmacist Clinical Goal(s): Over the next 90 days, patient will work with PharmD and providers to maintain optimal medication adherence Current pharmacy: [facility] Was using adherence packaging but patient requested change to receiving medications in bottle  03/2022 - changing pharmacy from [facility] to [facility].  Interventions Comprehensive medication review performed. Reviewed refill history and assessed adherence Encouraged patient to take all medications as prescribed. He should communicate medication problems or suspected side effect to either the clinical pharmacist or a medical provider so that adjustments can be made.    Patient Goals/Self-Care Activities Over the next 90 days, patient  will:  Focus on medication adherence by filling  medications appropriately. Set alarm on phone to remind you to take medications.  Report any questions or concerns to PharmD and/or provider(s). Is it important for your medical team to be aware if you suspect you are having side effects or taking medications differently.  Weigh daily, and contact provider if weight gain of of more than 3 lbs in 24 hours or 5 lbs in 1 week Constipation is commonly seen in patients with parkinson's disease. Patient encouraged to continue to use Senna Plus 1 or 2 tablets daily as needed for constipation.   Follow Up Plan: Telephone follow up appointment with care management team member scheduled for:  1 to 2 months           Medication Assistance: None required.  Patient affirms current coverage meets needs.  Patient's preferred pharmacy is:  [facility] - [place], [place] - [address] [place] [address] Phone: [phone] Fax: [phone]  [place], [place] [address] Phone: [phone] Fax: [phone]  Uses pill box? No -   Pt endorses 75% compliance  Follow Up:  Patient agrees to Care Plan and Follow-up.  Plan: Telephone follow up appointment with care management team member scheduled for:  1 to 2 months.   [name], PharmD Clinical Pharmacist [facility]

## 2022-03-01 NOTE — Telephone Encounter (Signed)
Ok to refill this? 

## 2022-03-02 MED ORDER — VORTIOXETINE HBR 5 MG PO TABS: 2.5000 mg | ORAL_TABLET | Freq: Every day | ORAL | 1 refills | Status: DC

## 2022-03-06 NOTE — Patient Instructions (Addendum)
[name] It was a pleasure speaking with you today.  Below is a summary of your health goals from our recent visit. You can also view your updated Chronic Care Management Care plan through your MyChart account.   Patient Goals/Self-Care Activities Focus on medication adherence by filling medications appropriately. Set alarm on phone to remind you to take medications.  Take medications as prescribed - EVERY day Report any questions or concerns to PharmD and/or provider(s). Is it important for your medical team to be aware if you suspect you are having side effects or taking medications differently.  Weigh daily, and contact provider if weight gain of of more than 3 lbs in 24 hours or 5 lbs in 1 week Continue to use fluticasone nasal spray  Constipation is commonly seen in patients with parkinson's disease. Patient encouraged to continue to use Senna Plus 1 or 2 tablets daily as needed for constipation.   As always if you have any questions or concerns especially regarding medications, please feel free to contact me either at the phone number below or with a MyChart message.   Keep up the good work!  [name], PharmD Clinical Pharmacist [place] [phone] (direct line)  [phone] (main office number)   The patient verbalized understanding of instructions, educational materials, and care plan provided today and DECLINED offer to receive copy of patient instructions, educational materials, and care plan.

## 2022-03-23 ENCOUNTER — Telehealth: Payer: Self-pay | Admitting: Family Medicine

## 2022-03-23 NOTE — Telephone Encounter (Signed)
LVM for patient to call back and schedule AWV with our Health coach.

## 2022-03-24 ENCOUNTER — Ambulatory Visit (INDEPENDENT_AMBULATORY_CARE_PROVIDER_SITE_OTHER): Payer: Medicare Other

## 2022-03-24 DIAGNOSIS — Z0000 Encounter for general adult medical examination without abnormal findings: Secondary | ICD-10-CM

## 2022-03-24 DIAGNOSIS — F339 Major depressive disorder, recurrent, unspecified: Secondary | ICD-10-CM | POA: Diagnosis not present

## 2022-03-24 NOTE — Progress Notes (Signed)
Subjective:   Shawn Meza is a 76 y.o. male who presents for an Initial Medicare Annual Wellness Visit.  I connected with  Shawn Meza on 03/24/22 by a audio enabled telemedicine application and verified that I am speaking with the correct person using two identifiers.  Patient Location: Home  Provider Location: Office/Clinic  I discussed the limitations of evaluation and management by telemedicine. The patient expressed understanding and agreed to proceed.   Review of Systems     Cardiac Risk Factors include: advanced age (>96mn, >>75women);obesity (BMI >30kg/m2);dyslipidemia;male gender     Objective:    Today's Vitals   03/24/22 1507  PainSc: 5    There is no height or weight on file to calculate BMI.     03/24/2022    3:03 PM 10/08/2021    3:33 PM 09/30/2021    3:36 PM 08/05/2021   11:09 PM 04/08/2021    3:24 PM 11/02/2020    1:54 PM 09/10/2020    2:04 PM  Advanced Directives  Does Patient Have a Medical Advance Directive? No;Yes No Yes No No Yes Yes  Type of AAcademic librarianLiving will  H[place]Living will   HFairfax StationLiving will HPalmview SouthLiving will  Does patient want to make changes to medical advance directive? No - Patient declined  No - Patient declined      Copy of HAnthonyin Chart? No - copy requested  No - copy requested      Would patient like information on creating a medical advance directive? No - Patient declined          Current Medications (verified) Outpatient Encounter Medications as of 03/24/2022  Medication Sig   acetaminophen (TYLENOL) 325 MG tablet Take 162.5 mg by mouth every 6 (six) hours as needed.   aspirin 81 MG EC tablet Take 1 tablet (81 mg total) by mouth daily.   Carbidopa-Levodopa ER (RYTARY) 36.25-145 MG CPCR Take 1 tablet 3 times a day (Patient taking differently: Take 1 tablet by mouth in the morning and at bedtime. Take 1  tablet 3 times a day)   esomeprazole (NEXIUM) 40 MG capsule Take 1 capsule (40 mg total) by mouth at bedtime.   FLOVENT HFA 110 MCG/ACT inhaler Inhale 1 puff into the lungs daily as needed (wheezing, shortness of breath).   furosemide (LASIX) 40 MG tablet TAKE 1/2 TABLET BY MOUTH weekly on monday, wednesday, and friday   gabapentin (NEURONTIN) 100 MG capsule Take 1 capsule (100 mg total) by mouth at bedtime.   metoprolol tartrate (LOPRESSOR) 25 MG tablet Take 1 tablet (25 mg total) by mouth daily.   nitroGLYCERIN (NITROSTAT) 0.4 MG SL tablet Place 1 tablet (0.4 mg total) under the tongue every 5 (five) minutes x 3 doses as needed for chest pain.   pravastatin (PRAVACHOL) 20 MG tablet Take 1 tablet (20 mg total) by mouth daily.   prednisoLONE acetate (PRED FORTE) 1 % ophthalmic suspension Place 1 drop into both eyes 2 (two) times daily.   sacubitril-valsartan (ENTRESTO) 24-26 MG Take 1 tablet by mouth daily.   senna-docusate (SENNA PLUS) 8.6-50 MG tablet Take 1 tablet by mouth daily as needed for mild constipation.   tiZANidine (ZANAFLEX) 4 MG tablet Take 1 tablet (4 mg total) by mouth every 6 (six) hours as needed for muscle spasms.   vortioxetine HBr (TRINTELLIX) 5 MG TABS tablet Take 0.5 tablets (2.5 mg total) by mouth daily.  fluticasone (FLONASE) 50 MCG/ACT nasal spray Place 1 spray into both nostrils daily.   No facility-administered encounter medications on file as of 03/24/2022.    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   Diarrhea 11/04/2020   Elevated PSA    Erectile dysfunction    Essential hypertension    Essential tremor    GERD (gastroesophageal reflux disease)    Headache(784.0)    Hiatal hernia     Hypercholesterolemia    Long-term use of aspirin therapy    Low back pain 10/28/2019   Major depression, chronic    Medial meniscus tear 1/44/3154   Metabolic syndrome    Morbid obesity ([place])    Myofascial pain 12/20/2019   Nephrolithiasis    hx of   NSTEMI (non-ST elevated myocardial infarction) ([place])    Parkinson's disease ([place])    S/P CABG (coronary artery bypass graft)    Transient ischemic attack    hx of   Trochanteric bursitis of right hip    Past Surgical History:  Procedure Laterality Date   CARDIAC CATHETERIZATION  5/12,1/13   4 stents placed   COLON SURGERY     CORONARY ARTERY BYPASS GRAFT     KNEE ARTHROSCOPY  10/11/2011   Procedure: ARTHROSCOPY KNEE;  Surgeon: Lorn Junes, MD;  Location: [facility];  Service: Orthopedics;  Laterality: Left;  Left Knee Arthroscopy with Medial and Lateral Partial Menisectomy, Chondroplasty   LEFT HEART CATHETERIZATION WITH CORONARY ANGIOGRAM N/A 08/25/2011   Procedure: LEFT HEART CATHETERIZATION WITH CORONARY ANGIOGRAM;  Surgeon: Burnell Blanks, MD;  [facility] LAB;  Service: Cardiovascular;  Laterality: N/A;   LITHOTRIPSY     STERIOD INJECTION  10/11/2011   Procedure: STEROID INJECTION;  Surgeon: Lorn Junes, MD;  Location: [place] [place];  Service: Orthopedics;  Laterality: Right;  Steroid Injection Second Toe   TRANSURETHRAL RESECTION OF PROSTATE     URETHRAL DILATION     Family History  Problem Relation Age of Onset   Heart disease Mother    Heart disease Father    Hyperlipidemia Father    Stroke Father    Hyperlipidemia Brother    Heart disease Brother    Coronary artery disease Other        family hx of male 1st degree relative ,67   Hyperlipidemia Other        family hx of   Hypertension Other        family hx of   Arthritis Other        family hx of   Healthy Daughter    Dementia Neg Hx    Social History   Socioeconomic History   Marital status: Divorced    Spouse  name: Not on file   Number of children: 1   Years of education: 12   Highest education level: High school graduate  Occupational History   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  Tobacco Use   Smoking status: Never    Passive exposure: Never   Smokeless tobacco: Never  Vaping Use   Vaping Use: Never used  Substance and Sexual Activity   Alcohol use: No   Drug use: No   Sexual activity: Not Currently  Other  Topics Concern   Not on file  Social History Narrative   Divorced 2016   Caffeine use: none       Social Determinants of Health   Financial Resource Strain: Low Risk  (09/30/2021)   Overall Financial Resource Strain (CARDIA)    Difficulty of Paying Living Expenses: Not hard at all  Food Insecurity: No Food Insecurity (09/30/2021)   Hunger Vital Sign    Worried About Running Out of Food in the Last Year: Never true    Ran Out of Food in the Last Year: Never true  Transportation Needs: No Transportation Needs (09/30/2021)   PRAPARE - Hydrologist (Medical): No    Lack of Transportation (Non-Medical): No  Recent Concern: Transportation Needs - Unmet Transportation Needs (09/23/2021)   PRAPARE - Hydrologist (Medical): Yes    Lack of Transportation (Non-Medical): No  Physical Activity: Inactive (09/30/2021)   Exercise Vital Sign    Days of Exercise per Week: 0 days    Minutes of Exercise per Session: 0 min  Stress: Stress Concern Present (09/30/2021)   [place]    Feeling of Stress : Rather much  Social Connections: Moderately Isolated (09/30/2021)   Social Connection and Isolation Panel [NHANES]    Frequency of Communication with Friends and Family: More than three times a week    Frequency of Social Gatherings with Friends and Family: More than three times a week    Attends Religious Services: More than 4 times per year    Active Member of  [employer] or Organizations: No    Attends Music therapist: Never    Marital Status: Divorced    Tobacco Counseling Counseling given: Not Answered   Clinical Intake:  Pre-visit preparation completed: Yes  Pain : 0-10 Pain Score: 5  Pain Type: Acute pain Pain Location: Other (Comment) (stomach) Pain Descriptors / Indicators: Aching, Dull, Sore Pain Onset: Yesterday Pain Frequency: Constant     BMI - recorded: 33.27 Nutritional Status: BMI > 30  Obese Nutritional Risks: None Diabetes: No  How often do you need to have someone help you when you read instructions, pamphlets, or other written materials from your doctor or pharmacy?: 1 - Never  Diabetic?no  Interpreter Needed?: No  Information entered by :: Salem Mastrogiovanni   Activities of Daily Living    03/24/2022    3:10 PM 09/30/2021    3:33 PM  In your present state of health, do you have any difficulty performing the following activities:  Hearing? 1 0  Comment hearing aid   Vision? 0 0  Difficulty concentrating or making decisions? 0 1  Comment  Parkinson's  Walking or climbing stairs? 1 1  Comment  Unsteady Balance/Gait & Tremors  Dressing or bathing? 0 0  Doing errands, shopping? 0 0  Preparing Food and eating ? N N  Using the Toilet? N N  In the past six months, have you accidently leaked urine? Y N  Do you have problems with loss of bowel control? Y N  Managing your Medications? N N  Managing your Finances? N N  Housekeeping or managing your Housekeeping? N N    Patient Care Team: [name], DO as PCP - General (Family Medicine) [name] Hilton Cork, MD as PCP - Cardiology (Cardiology) [name], MD (General Surgery) [name], MD as Consulting Physician (General Surgery) [name], Eustace Quail, DO as Consulting Physician (  Neurology) [name], RPH-CPP (Pharmacist)  Indicate any recent Medical Services you may have received from other than [facility] providers in the past year  (date may be approximate).     Assessment:   This is a routine wellness examination for Shawn Meza.  Hearing/Vision screen No results found.  Dietary issues and exercise activities discussed: Current Exercise Habits: The patient does not participate in regular exercise at present, Exercise limited by: None identified   Goals Addressed   None    Depression Screen    03/24/2022    3:04 PM 03/01/2022    3:38 PM 09/30/2021    3:25 PM 09/23/2021    1:22 PM 12/17/2018    1:41 PM 07/15/2015   11:22 AM 06/17/2015   11:47 AM  PHQ 2/9 Scores  PHQ - 2 Score '4 6 6 6 6 6 4  '$ PHQ- 9 Score '15 15 17 18 15 19 14    '$ Fall Risk    03/24/2022    3:04 PM 10/08/2021    3:31 PM 09/30/2021    3:32 PM 09/23/2021    1:22 PM 04/08/2021    3:24 PM  Fall Risk   Falls in the past year? 0 1 0 0 0  Number falls in past yr: 0 0 0  0  Injury with Fall? 0 1 0  0  Risk for fall due to : No Fall Risks  Impaired balance/gait;Impaired mobility;Other (Comment)    Risk for fall due to: Comment   Tremors    Follow up Falls evaluation completed  Falls evaluation completed;Education provided;Falls prevention discussed      FALL RISK PREVENTION PERTAINING TO THE HOME:  Any stairs in or around the home? No  If so, are there any without handrails?  N/a Home free of loose throw rugs in walkways, pet beds, electrical cords, etc? Yes  Adequate lighting in your home to reduce risk of falls? Yes   ASSISTIVE DEVICES UTILIZED TO PREVENT FALLS:  Life alert? No  Use of a cane, walker or w/c? No  Grab bars in the bathroom? No  Shower chair or bench in shower? No  Elevated toilet seat or a handicapped toilet? No   TIMED UP AND GO:  Was the test performed? No .    Cognitive Function:      07/06/2015    1:23 PM  Montreal Cognitive Assessment   Visuospatial/ Executive (0/5) 5  Naming (0/3) 2  Attention: Read list of digits (0/2) 2  Attention: Read list of letters (0/1) 1  Attention: Serial 7 subtraction starting at 100  (0/3) 2  Language: Repeat phrase (0/2) 1  Language : Fluency (0/1) 0  Abstraction (0/2) 1  Delayed Recall (0/5) 3  Orientation (0/6) 6  Total 23  Adjusted Score (based on education) 24      03/24/2022    3:15 PM  6CIT Screen  What Year? 0 points  What month? 0 points  What time? 0 points  Count back from 20 0 points  Months in reverse 4 points  Repeat phrase 0 points  Total Score 4 points    Immunizations Immunization History  Administered Date(s) Administered   Fluad Quad(high Dose 65+) 05/06/2019   Influenza Split 04/18/2012   Influenza Whole 05/20/2010   Influenza,inj,Quad PF,6+ Mos 06/13/2013, 05/20/2015   Influenza-Unspecified 05/31/2015, 06/29/2021   PFIZER(Purple Top)SARS-COV-2 Vaccination 09/27/2019, 10/22/2019   Pneumococcal Conjugate-13 05/20/2015   Pneumococcal Polysaccharide-23 05/20/2010   Td 08/02/2007    TDAP status: Due, Education has been  provided regarding the importance of this vaccine. Advised may receive this vaccine at local pharmacy or Health Dept. Aware to provide a copy of the vaccination record if obtained from local pharmacy or Health Dept. Verbalized acceptance and understanding.  Flu Vaccine status: Up to date  Pneumococcal vaccine status: Up to date  Covid-19 vaccine status: Declined, Education has been provided regarding the importance of this vaccine but patient still declined. Advised may receive this vaccine at local pharmacy or Health Dept.or vaccine clinic. Aware to provide a copy of the vaccination record if obtained from local pharmacy or Health Dept. Verbalized acceptance and understanding.  Qualifies for Shingles Vaccine? Yes   Zostavax completed No   Shingrix Completed?: No.    Education has been provided regarding the importance of this vaccine. Patient has been advised to call insurance company to determine out of pocket expense if they have not yet received this vaccine. Advised may also receive vaccine at local pharmacy or  Health Dept. Verbalized acceptance and understanding.  Screening Tests Health Maintenance  Topic Date Due   Hepatitis C Screening  Never done   INFLUENZA VACCINE  03/01/2022   Zoster Vaccines- Shingrix (1 of 2) 04/29/2022 (Originally 03/29/1965)   Pneumonia Vaccine 38+ Years old  Completed   HPV VACCINES  Aged Out   COLONOSCOPY (Pts 45-24yr Insurance coverage will need to be confirmed)  Discontinued   TETANUS/TDAP  Discontinued   COVID-19 Vaccine  Discontinued    Health Maintenance  Health Maintenance Due  Topic Date Due   Hepatitis C Screening  Never done   INFLUENZA VACCINE  03/01/2022    Colorectal cancer screening: No longer required.   Lung Cancer Screening: (Low Dose CT Chest recommended if Age 78-80years, 30 pack-year currently smoking OR have quit w/in 15years.) does not qualify.   Lung Cancer Screening Referral: n/a  Additional Screening:  Hepatitis C Screening: does qualify; Completed not yet   Vision Screening: Recommended annual ophthalmology exams for early detection of glaucoma and other disorders of the eye. Is the patient up to date with their annual eye exam?  Yes  Who is the provider or what is the name of the office in which the patient attends annual eye exams? Dr, WDelcie RochIf pt is not established with a provider, would they like to be referred to a provider to establish care? No .   Dental Screening: Recommended annual dental exams for proper oral hygiene  Community Resource Referral / Chronic Care Management: CRR required this visit?  No   CCM required this visit?  No      Plan:     I have personally reviewed and noted the following in the patient's chart:   Medical and social history Use of alcohol, tobacco or illicit drugs  Current medications and supplements including opioid prescriptions. Patient is not currently taking opioid prescriptions. Functional ability and status Nutritional status Physical activity Advanced directives List  of other physicians Hospitalizations, surgeries, and ER visits in previous 12 months Vitals Screenings to include cognitive, depression, and falls Referrals and appointments  In addition, I have reviewed and discussed with patient certain preventive protocols, quality metrics, and best practice recommendations. A written personalized care plan for preventive services as well as general preventive health recommendations were provided to patient.  Due to this being a telephonic visit, the after visit summary with patients personalized plan was offered to patient via mail or my-chart.  Patient would like to access on my-chart.  Duard Brady Ledarrius Beauchaine, [place]   03/24/2022   Nurse Notes: none

## 2022-03-24 NOTE — Patient Instructions (Signed)
[name] , Thank you for taking time to come for your Medicare Wellness Visit. I appreciate your ongoing commitment to your health goals. Please review the following plan we discussed and let me know if I can assist you in the future.   Screening recommendations/referrals: Colonoscopy: no longer needed Recommended yearly ophthalmology/optometry visit for glaucoma screening and checkup Recommended yearly dental visit for hygiene and checkup  Vaccinations: Influenza vaccine: up to date Pneumococcal vaccine: up to date Tdap vaccine: Due-May obtain vaccine at  your local pharmacy.  Shingles vaccine: Due-May obtain vaccine at your local pharmacy.    Covid-19: declined  Advanced directives: yes, not on file  Conditions/risks identified: see problem list   Next appointment: Follow up in one year for your annual wellness visit. 03/28/23  Preventive Care 65 Years and Older, Male Preventive care refers to lifestyle choices and visits with your health care provider that can promote health and wellness. What does preventive care include? A yearly physical exam. This is also called an annual well check. Dental exams once or twice a year. Routine eye exams. Ask your health care provider how often you should have your eyes checked. Personal lifestyle choices, including: Daily care of your teeth and gums. Regular physical activity. Eating a healthy diet. Avoiding tobacco and drug use. Limiting alcohol use. Practicing safe sex. Taking low doses of aspirin every day. Taking vitamin and mineral supplements as recommended by your health care provider. What happens during an annual well check? The services and screenings done by your health care provider during your annual well check will depend on your age, overall health, lifestyle risk factors, and family history of disease. Counseling  Your health care provider may ask you questions about your: Alcohol use. Tobacco use. Drug use. Emotional  well-being. Home and relationship well-being. Sexual activity. Eating habits. History of falls. Memory and ability to understand (cognition). Work and work Statistician. Screening  You may have the following tests or measurements: Height, weight, and BMI. Blood pressure. Lipid and cholesterol levels. These may be checked every 5 years, or more frequently if you are over 69 years old. Skin check. Lung cancer screening. You may have this screening every year starting at age 78 if you have a 30-pack-year history of smoking and currently smoke or have quit within the past 15 years. Fecal occult blood test (FOBT) of the stool. You may have this test every year starting at age 15. Flexible sigmoidoscopy or colonoscopy. You may have a sigmoidoscopy every 5 years or a colonoscopy every 10 years starting at age 78. Prostate cancer screening. Recommendations will vary depending on your family history and other risks. Hepatitis C blood test. Hepatitis B blood test. Sexually transmitted disease (STD) testing. Diabetes screening. This is done by checking your blood sugar (glucose) after you have not eaten for a while (fasting). You may have this done every 1-3 years. Abdominal aortic aneurysm (AAA) screening. You may need this if you are a current or former smoker. Osteoporosis. You may be screened starting at age 56 if you are at high risk. Talk with your health care provider about your test results, treatment options, and if necessary, the need for more tests. Vaccines  Your health care provider may recommend certain vaccines, such as: Influenza vaccine. This is recommended every year. Tetanus, diphtheria, and acellular pertussis (Tdap, Td) vaccine. You may need a Td booster every 10 years. Zoster vaccine. You may need this after age 66. Pneumococcal 13-valent conjugate (PCV13) vaccine. One dose is  recommended after age 22. Pneumococcal polysaccharide (PPSV23) vaccine. One dose is recommended after  age 72. Talk to your health care provider about which screenings and vaccines you need and how often you need them. This information is not intended to replace advice given to you by your health care provider. Make sure you discuss any questions you have with your health care provider. Document Released: 08/14/2015 Document Revised: 04/06/2016 Document Reviewed: 05/19/2015 Elsevier Interactive Patient Education  2017 [place] Prevention in the Home Falls can cause injuries. They can happen to people of all ages. There are many things you can do to make your home safe and to help prevent falls. What can I do on the outside of my home? Regularly fix the edges of walkways and driveways and fix any cracks. Remove anything that might make you trip as you walk through a door, such as a raised step or threshold. Trim any bushes or trees on the path to your home. Use bright outdoor lighting. Clear any walking paths of anything that might make someone trip, such as rocks or tools. Regularly check to see if handrails are loose or broken. Make sure that both sides of any steps have handrails. Any raised decks and porches should have guardrails on the edges. Have any leaves, snow, or ice cleared regularly. Use sand or salt on walking paths during winter. Clean up any spills in your garage right away. This includes oil or grease spills. What can I do in the bathroom? Use night lights. Install grab bars by the toilet and in the tub and shower. Do not use towel bars as grab bars. Use non-skid mats or decals in the tub or shower. If you need to sit down in the shower, use a plastic, non-slip stool. Keep the floor dry. Clean up any water that spills on the floor as soon as it happens. Remove soap buildup in the tub or shower regularly. Attach bath mats securely with double-sided non-slip rug tape. Do not have throw rugs and other things on the floor that can make you trip. What can I do in the  bedroom? Use night lights. Make sure that you have a light by your bed that is easy to reach. Do not use any sheets or blankets that are too big for your bed. They should not hang down onto the floor. Have a firm chair that has side arms. You can use this for support while you get dressed. Do not have throw rugs and other things on the floor that can make you trip. What can I do in the kitchen? Clean up any spills right away. Avoid walking on wet floors. Keep items that you use a lot in easy-to-reach places. If you need to reach something above you, use a strong step stool that has a grab bar. Keep electrical cords out of the way. Do not use floor polish or wax that makes floors slippery. If you must use wax, use non-skid floor wax. Do not have throw rugs and other things on the floor that can make you trip. What can I do with my stairs? Do not leave any items on the stairs. Make sure that there are handrails on both sides of the stairs and use them. Fix handrails that are broken or loose. Make sure that handrails are as long as the stairways. Check any carpeting to make sure that it is firmly attached to the stairs. Fix any carpet that is loose or worn. Avoid having  throw rugs at the top or bottom of the stairs. If you do have throw rugs, attach them to the floor with carpet tape. Make sure that you have a light switch at the top of the stairs and the bottom of the stairs. If you do not have them, ask someone to add them for you. What else can I do to help prevent falls? Wear shoes that: Do not have high heels. Have rubber bottoms. Are comfortable and fit you well. Are closed at the toe. Do not wear sandals. If you use a stepladder: Make sure that it is fully opened. Do not climb a closed stepladder. Make sure that both sides of the stepladder are locked into place. Ask someone to hold it for you, if possible. Clearly mark and make sure that you can see: Any grab bars or  handrails. First and last steps. Where the edge of each step is. Use tools that help you move around (mobility aids) if they are needed. These include: Canes. Walkers. Scooters. Crutches. Turn on the lights when you go into a dark area. Replace any light bulbs as soon as they burn out. Set up your furniture so you have a clear path. Avoid moving your furniture around. If any of your floors are uneven, fix them. If there are any pets around you, be aware of where they are. Review your medicines with your doctor. Some medicines can make you feel dizzy. This can increase your chance of falling. Ask your doctor what other things that you can do to help prevent falls. This information is not intended to replace advice given to you by your health care provider. Make sure you discuss any questions you have with your health care provider. Document Released: 05/14/2009 Document Revised: 12/24/2015 Document Reviewed: 08/22/2014 Elsevier Interactive Patient Education  2017 [employer].

## 2022-03-30 DIAGNOSIS — H0288B Meibomian gland dysfunction left eye, upper and lower eyelids: Secondary | ICD-10-CM | POA: Diagnosis not present

## 2022-03-30 DIAGNOSIS — H0288A Meibomian gland dysfunction right eye, upper and lower eyelids: Secondary | ICD-10-CM | POA: Diagnosis not present

## 2022-03-30 DIAGNOSIS — H16223 Keratoconjunctivitis sicca, not specified as Sjogren's, bilateral: Secondary | ICD-10-CM | POA: Diagnosis not present

## 2022-03-31 ENCOUNTER — Encounter: Payer: Self-pay | Admitting: Cardiology

## 2022-03-31 ENCOUNTER — Ambulatory Visit: Payer: Medicare Other | Attending: Cardiology | Admitting: Cardiology

## 2022-03-31 VITALS — BP 134/58 | HR 82 | Ht 66.0 in | Wt 209.2 lb

## 2022-03-31 DIAGNOSIS — I25119 Atherosclerotic heart disease of native coronary artery with unspecified angina pectoris: Secondary | ICD-10-CM | POA: Diagnosis not present

## 2022-03-31 DIAGNOSIS — Z789 Other specified health status: Secondary | ICD-10-CM

## 2022-03-31 DIAGNOSIS — E782 Mixed hyperlipidemia: Secondary | ICD-10-CM

## 2022-03-31 DIAGNOSIS — G20 Parkinson's disease: Secondary | ICD-10-CM

## 2022-03-31 DIAGNOSIS — E785 Hyperlipidemia, unspecified: Secondary | ICD-10-CM | POA: Diagnosis not present

## 2022-03-31 DIAGNOSIS — I110 Hypertensive heart disease with heart failure: Secondary | ICD-10-CM

## 2022-03-31 DIAGNOSIS — F339 Major depressive disorder, recurrent, unspecified: Secondary | ICD-10-CM

## 2022-03-31 DIAGNOSIS — I951 Orthostatic hypotension: Secondary | ICD-10-CM

## 2022-03-31 DIAGNOSIS — I509 Heart failure, unspecified: Secondary | ICD-10-CM

## 2022-03-31 DIAGNOSIS — I2510 Atherosclerotic heart disease of native coronary artery without angina pectoris: Secondary | ICD-10-CM | POA: Diagnosis not present

## 2022-03-31 DIAGNOSIS — I5022 Chronic systolic (congestive) heart failure: Secondary | ICD-10-CM | POA: Diagnosis not present

## 2022-03-31 MED ORDER — FUROSEMIDE 20 MG PO TABS: 20.0000 mg | ORAL_TABLET | Freq: Every day | ORAL | 3 refills | Status: DC

## 2022-03-31 NOTE — Progress Notes (Signed)
Cardiology Office Note:    Date:  03/31/2022   ID:  Shawn Meza, DOB 01/15/1946, MRN [id number]  PCP:  Shawn Pal, DO  Cardiologist:  Shirlee More, MD    Referring MD: Shawn Meza*    ASSESSMENT:    1. Chronic systolic (congestive) heart failure ([place])   2. Hypertensive heart disease with heart failure ([place])   3. Coronary artery disease involving native coronary artery of native heart with angina pectoris ([place])   4. Mixed hyperlipidemia   5. Statin intolerance   6. Parkinson's disease ([place])   7. Orthostatic hypotension    PLAN:    In order of problems listed above:  Clinically he has worsened symptoms of heart failure he is edematous his weight is up 3 pounds on the scale he has been omitting his diuretic and have asked him to take his diuretic daily recheck his BMP as well as echocardiogram with his previous severe cardiomyopathy.  Unfortunate I do not think we can uptitrate his guideline directed therapy with his symptomatic hypotension from Parkinson's and he will continue his beta-blocker.  Consider MRA depending on renal function. Stable CAD continue medical treatment aspirin lipid-lowering beta-blocker and I think that at this point in his life he is a poor candidate for elective interventional cardiac procedures Continue his low potency statin check lipid profile Stable orthostatic hypotension   Next appointment: 3 months   Medication Adjustments/Labs and Tests Ordered: Current medicines are reviewed at length with the patient today.  Concerns regarding medicines are outlined above.  Orders Placed This Encounter  Procedures   Comp Met (CMET)   Pro b natriuretic peptide   Lipid Profile   CBC   TSH   ECHOCARDIOGRAM COMPLETE   Meds ordered this encounter  Medications   furosemide (LASIX) 20 MG tablet    Sig: Take 1 tablet (20 mg total) by mouth daily.    Dispense:  90 tablet    Refill:  3    Chief Complaint  Patient presents with    Follow-up   Congestive Heart Failure   Cardiomyopathy   Coronary Artery Disease   Hypotension    History of Present Illness:    Shawn Meza is a 76 y.o. male with a hx of  CAD and CABG March 2019 hypertensive heart disease with EF 35 to 40% hyperlipidemia idiopathic Parkinson's disease and autonomic dysfunction with orthostatic hypotension. last seen 09/27/2021.  Compliance with diet, lifestyle and medications: Yes although he frequently omits his diuretic  He is not doing as well he again is socially isolated his wife has left and he is living alone with 2 dogs.  He struggles more day today he finds himself fatigued and short of breath when he does activities outside of the home.  He is not having orthopnea but he has peripheral edema was unaware of it no angina palpitation or syncope. 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   Diarrhea 11/04/2020   Elevated PSA    Erectile dysfunction    Essential hypertension    Essential tremor    GERD (gastroesophageal reflux disease)    Headache(784.0)    Hiatal hernia    Hypercholesterolemia  Long-term use of aspirin therapy    Low back pain 10/28/2019   Major depression, chronic    Medial meniscus tear 0/98/1191   Metabolic syndrome    Morbid obesity (HCC)    Myofascial pain 12/20/2019   Nephrolithiasis    hx of   NSTEMI (non-ST elevated myocardial infarction) ([place])    Parkinson's disease ([place])    S/P CABG (coronary artery bypass graft)    Transient ischemic attack    hx of   Trochanteric bursitis of right hip     Past Surgical History:  Procedure Laterality Date   CARDIAC CATHETERIZATION  5/12,1/13   4 stents placed   COLON  SURGERY     CORONARY ARTERY BYPASS GRAFT     KNEE ARTHROSCOPY  10/11/2011   Procedure: ARTHROSCOPY KNEE;  Surgeon: Lorn Junes, MD;  Location: [facility];  Service: Orthopedics;  Laterality: Left;  Left Knee Arthroscopy with Medial and Lateral Partial Menisectomy, Chondroplasty   LEFT HEART CATHETERIZATION WITH CORONARY ANGIOGRAM N/A 08/25/2011   Procedure: LEFT HEART CATHETERIZATION WITH CORONARY ANGIOGRAM;  Surgeon: Burnell Blanks, MD;  Location[facility] C[facility] LAB;  Service: Cardiovascular;  Laterality: N/A;   LITHOTRIPSY     STERIOD INJECTION  10/11/2011   Procedure: STEROID INJECTION;  Surgeon: Lorn Junes, MD;  Location: [place];  Service: Orthopedics;  Laterality: Right;  Steroid Injection Second Toe   TRANSURETHRAL RESECTION OF PROSTATE     URETHRAL DILATION      Current Medications: Current Meds  Medication Sig   acetaminophen (TYLENOL) 325 MG tablet Take 162.5 mg by mouth every 6 (six) hours as needed.   aspirin 81 MG EC tablet Take 1 tablet (81 mg total) by mouth daily.   Carbidopa-Levodopa ER (RYTARY) 36.25-145 MG CPCR Take 1 tablet 3 times a day (Patient taking differently: Take 1 tablet by mouth in the morning and at bedtime. Take 1 tablet 3 times a day)   esomeprazole (NEXIUM) 40 MG capsule Take 1 capsule (40 mg total) by mouth at bedtime.   FLOVENT HFA 110 MCG/ACT inhaler Inhale 1 puff into the lungs daily as needed (wheezing, shortness of breath).   fluticasone (FLONASE) 50 MCG/ACT nasal spray Place 1 spray into both nostrils daily.   furosemide (LASIX) 20 MG tablet Take 1 tablet (20 mg total) by mouth daily.   gabapentin (NEURONTIN) 100 MG capsule Take 1 capsule (100 mg total) by mouth at bedtime.   metoprolol tartrate (LOPRESSOR) 25 MG tablet Take 1 tablet (25 mg total) by mouth daily.   nitroGLYCERIN (NITROSTAT) 0.4 MG SL tablet Place 1 tablet (0.4 mg total) under the tongue every 5 (five) minutes x 3 doses as needed for chest pain.    pravastatin (PRAVACHOL) 20 MG tablet Take 1 tablet (20 mg total) by mouth daily.   prednisoLONE acetate (PRED FORTE) 1 % ophthalmic suspension Place 1 drop into both eyes 2 (two) times daily.   sacubitril-valsartan (ENTRESTO) 24-26 MG Take 1 tablet by mouth daily.   senna-docusate (SENNA PLUS) 8.6-50 MG tablet Take 1 tablet by mouth daily as needed for mild constipation.   tiZANidine (ZANAFLEX) 4 MG tablet Take 1 tablet (4 mg total) by mouth every 6 (six) hours as needed for muscle spasms.   vortioxetine HBr (TRINTELLIX) 5 MG TABS tablet Take 0.5 tablets (2.5 mg total) by mouth daily.   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  Tobacco Use   Smoking status: Never    Passive exposure: Never   Smokeless tobacco: Never  Vaping Use   Vaping Use: Never used  Substance and Sexual Activity   Alcohol use: No   Drug use: No   Sexual activity: Not Currently  Other Topics Concern   Not on file  Social History Narrative   Divorced 2016   Caffeine use: none       Social Determinants of Health   Financial Resource Strain: Low Risk  (09/30/2021)   Overall Financial Resource Strain (CARDIA)    Difficulty of Paying Living Expenses: Not hard at all  Food Insecurity: No Food Insecurity (09/30/2021)   Hunger Vital Sign    Worried About Running Out of Food in the Last Year: Never true    [place] in the Last Year: Never true  Transportation Needs: No Transportation Needs (09/30/2021)   PRAPARE - Hydrologist (Medical): No     Lack of Transportation (Non-Medical): No  Recent Concern: Transportation Needs - Unmet Transportation Needs (09/23/2021)   PRAPARE - Hydrologist (Medical): Yes    Lack of Transportation (Non-Medical): No  Physical Activity: Inactive (09/30/2021)   Exercise Vital Sign    Days of Exercise per Week: 0 days    Minutes of Exercise per Session: 0 min  Stress: Stress Concern Present (09/30/2021)   [place]    Feeling of Stress : Rather much  Social Connections: Moderately Isolated (09/30/2021)   Social Connection and Isolation Panel [NHANES]    Frequency of Communication with Friends and Family: More than three times a week    Frequency of Social Gatherings with Friends and Family: More than three times a week    Attends Religious Services: More than 4 times per year    Active Member of [employer] or Organizations: No    Attends Music therapist: Never    Marital Status: Divorced     Family History: The patient's family history includes Arthritis in an other family member; Coronary artery disease in an other family member; Healthy in his daughter; Heart disease in his brother, father, and mother; Hyperlipidemia in his brother, father, and another family member; Hypertension in an other family member; Stroke in his father. There is no history of Dementia. ROS:   Please see the history of present illness.    All other systems reviewed and are negative.  EKGs/Labs/Other Studies Reviewed:    The following studies were reviewed today:   Recent Labs: 09/27/2021: ALT 18 10/22/2021: B Natriuretic Peptide 253.8; BUN 13; Creatinine, Ser 0.95; Hemoglobin 16.2; Platelets 202; Potassium 3.8; Sodium 137  Recent Lipid Panel    Component Value Date/Time   CHOL 160 09/27/2021 1440   TRIG 120 09/27/2021 1440   TRIG 107 05/09/2010 0000   HDL 42 09/27/2021 1440   CHOLHDL 3.8 09/27/2021 1440   CHOLHDL 3.6  05/20/2015 1410   VLDL 27 05/20/2015 1410   LDLCALC 96 09/27/2021 1440    Physical Exam:    VS:  BP (!) 134/58 (BP Location: Left Arm, Patient Position: Sitting)   Pulse 82   Ht 5' 6" (1.676 m)   Wt 209 lb 3.2 oz (94.9 kg)   SpO2 96%   BMI 33.77 kg/m     Wt Readings from Last 3 Encounters:  03/31/22 209 lb 3.2 oz (94.9 kg)  10/27/21 206 lb 2 oz (93.5 kg)  10/22/21 206 lb 9.1 oz (93.7 kg)     GEN:  Well nourished, well developed in no acute distress HEENT: Normal NECK: No JVD; No carotid bruits LYMPHATICS: No lymphadenopathy CARDIAC: RRR, no murmurs, rubs, gallops RESPIRATORY:  Clear to auscultation without rales, wheezing or rhonchi  ABDOMEN: Soft, non-tender, non-distended MUSCULOSKELETAL: 2+ bilateral lower extremity pitting edema; No deformity  SKIN: Warm and dry NEUROLOGIC:  Alert and oriented x 3 PSYCHIATRIC:  Normal affect    Signed, Shirlee More, MD  03/31/2022 5:57 PM    [place]

## 2022-03-31 NOTE — Patient Instructions (Signed)
Medication Instructions:  Your physician has recommended you make the following change in your medication:   START: Furosemide 20 mg daily   *If you need a refill on your cardiac medications before your next appointment, please call your pharmacy*   Lab Work: Your physician recommends that you return for lab work in:   Labs today: CMP, Pro BNP, Lipid, CBC, TSH  If you have labs (blood work) drawn today and your tests are completely normal, you will receive your results only by: [place] (if you have MyChart) OR A paper copy in the mail If you have any lab test that is abnormal or we need to change your treatment, we will call you to review the results.   Testing/Procedures: Your physician has requested that you have an echocardiogram. Echocardiography is a painless test that uses sound waves to create images of your heart. It provides your doctor with information about the size and shape of your heart and how well your heart's chambers and valves are working. This procedure takes approximately one hour. There are no restrictions for this procedure.    Follow-Up: At [facility], you and your health needs are our priority.  As part of our continuing mission to provide you with exceptional heart care, we have created designated Provider Care Teams.  These Care Teams include your primary Cardiologist (physician) and Advanced Practice Providers (APPs -  Physician Assistants and Nurse Practitioners) who all work together to provide you with the care you need, when you need it.  We recommend signing up for the patient portal called "MyChart".  Sign up information is provided on this After Visit Summary.  MyChart is used to connect with patients for Virtual Visits (Telemedicine).  Patients are able to view lab/test results, encounter notes, upcoming appointments, etc.  Non-urgent messages can be sent to your provider as well.   To learn more about what you can do with MyChart,  go to [email or web address].    Your next appointment:   3 month(s)  The format for your next appointment:   In Person  Provider:   Shirlee More, MD    Other Instructions None  Important Information About Sugar

## 2022-04-01 LAB — LIPID PANEL
Chol/HDL Ratio: 4.5 ratio (ref 0.0–5.0)
Cholesterol, Total: 163 mg/dL (ref 100–199)
HDL: 36 mg/dL — ABNORMAL LOW (ref >39)
LDL Chol Calc (NIH): 91 mg/dL (ref 0–99)
Triglycerides: 214 mg/dL — ABNORMAL HIGH (ref 0–149)
VLDL Cholesterol Cal: 36 mg/dL (ref 5–40)

## 2022-04-01 LAB — CBC
Hematocrit: 44.5 % (ref 37.5–51.0)
Hemoglobin: 15.3 g/dL (ref 13.0–17.7)
MCH: 31.6 pg (ref 26.6–33.0)
MCHC: 34.4 g/dL (ref 31.5–35.7)
MCV: 92 fL (ref 79–97)
Platelets: 199 10*3/uL (ref 150–450)
RBC: 4.84 x10E6/uL (ref 4.14–5.80)
RDW: 12.5 % (ref 11.6–15.4)
WBC: 8.2 10*3/uL (ref 3.4–10.8)

## 2022-04-01 LAB — COMPREHENSIVE METABOLIC PANEL
ALT: 16 IU/L (ref 0–44)
AST: 17 IU/L (ref 0–40)
Albumin/Globulin Ratio: 1.8 (ref 1.2–2.2)
Albumin: 4 g/dL (ref 3.8–4.8)
Alkaline Phosphatase: 85 IU/L (ref 44–121)
BUN/Creatinine Ratio: 11 (ref 10–24)
BUN: 13 mg/dL (ref 8–27)
Bilirubin Total: 0.3 mg/dL (ref 0.0–1.2)
CO2: 22 mmol/L (ref 20–29)
Calcium: 8.9 mg/dL (ref 8.6–10.2)
Chloride: 107 mmol/L — ABNORMAL HIGH (ref 96–106)
Creatinine, Ser: 1.18 mg/dL (ref 0.76–1.27)
Globulin, Total: 2.2 g/dL (ref 1.5–4.5)
Glucose: 100 mg/dL — ABNORMAL HIGH (ref 70–99)
Potassium: 4.5 mmol/L (ref 3.5–5.2)
Sodium: 142 mmol/L (ref 134–144)
Total Protein: 6.2 g/dL (ref 6.0–8.5)
eGFR: 63 mL/min/{1.73_m2} (ref >59)

## 2022-04-01 LAB — TSH: TSH: 1.91 u[IU]/mL (ref 0.450–4.500)

## 2022-04-01 LAB — PRO B NATRIURETIC PEPTIDE: NT-Pro BNP: 487 pg/mL — ABNORMAL HIGH (ref 0–486)

## 2022-04-05 ENCOUNTER — Telehealth: Payer: Self-pay | Admitting: Cardiology

## 2022-04-05 NOTE — Telephone Encounter (Signed)
Patient informed of results.  

## 2022-04-05 NOTE — Telephone Encounter (Signed)
Pt is requesting call back in regards to labs. Stated he will check MyChart.

## 2022-04-06 ENCOUNTER — Telehealth: Payer: Medicare Other

## 2022-04-11 NOTE — Progress Notes (Unsigned)
Assessment/Plan:   1.  Parkinsons Disease  -Clinically, he is always worse when he is off of levodopa.  However, he has tried nearly every form of levodopa on the market.  Some of the side effects that he reports are doubtful side effects of medication.  He has reported trouble breathing at night with Rytary (although cardiology noted that patient stopped his diuretic, which was the likely source).  He reported sleepiness with levodopa (and while that could be, he also reported that Tylenol and Advil make him sleepy).  Unfortunately, I am not sure that there is much more to offer.  Certainly, he may tolerate something like rasagiline, but I am not sure that clinically it is going to offer him much, and really think that he needs levodopa therapy.  I could try Dhivy, because I could split it all the way into 1/4 tablets but it is doubtful insurance will pay for it     2.  Depression  -On Trintellix.  Managed by primary care.  Declines psychiatry.  I still think that this is a big issue in patient's life.  -He saw our social worker a few times. 3.  B12 deficiency  -On supplementation.  4.  Lumbar spinal stenosis  -has followed with [facility] in past  -Has more recently seen Dr. Raeford Razor (sports med)  -Requests referral to physical therapy in  for associated sciatica.  Referral sent today.  I am hopeful that they will work with Parkinson's therapy as well.  5.  Insomnia  -Continue melatonin to 3 mg.  6.  SOB  `-Patient attributed this to Rytary, but cardiology visit note [place] clear that patient has severe cardiomyopathy and not taking his diuretic, which has resulted in more edema. Subjective:   Shawn Meza was seen today in follow up for Parkinsons disease.  My previous records were reviewed prior to todays visit as well as outside records available to me.  I received correspondence from the Pharm.D. helping to manage him since our last visit.  We started him on  Rytary.  Patient complained at to the Pharm.D. that he was having dry mouth, trouble breathing at night and constipation.  He was told that some of these could be symptoms of Parkinson's, but he stopped the medication anyway.  Records are reviewed from his cardiology visit from August 31.  He also stopped his diuretic and cardiology noted that patient was more edematous.  Current prescribed movement disorder medications: Carbidopa/levodopa 25/100 CR, 2 at 8 AM/2 at noon/1 at 4 PM  Carbidopa/levodopa 50/200 CR at bedtime (added last visit) B12 supplement Melatonin, 3 mg  Prior medications: Carbidopa/levodopa 25/100 IR: Carbidopa/levodopa 25/100 CR: Carbidopa/levodopa 50/200 CR; rytary (c/o dry mouth, trouble breathing at night and constipation)   ALLERGIES:   Allergies  Allergen Reactions   Rosuvastatin Other (See Comments)    Whole body aches   Testosterone Other (See Comments)    ABDOMINAL PAIN and cramping   Fluoxetine Other (See Comments)    Caused depression and aggression   Requip [Ropinirole] Nausea Only   Tizanidine Hcl Hives    CURRENT MEDICATIONS:  Outpatient Encounter Medications as of 04/12/2022  Medication Sig   acetaminophen (TYLENOL) 325 MG tablet Take 162.5 mg by mouth every 6 (six) hours as needed.   aspirin 81 MG EC tablet Take 1 tablet (81 mg total) by mouth daily.   Carbidopa-Levodopa ER (RYTARY) 36.25-145 MG CPCR Take 1 tablet 3 times a day (Patient taking differently: Take 1  tablet by mouth in the morning and at bedtime. Take 1 tablet 3 times a day)   esomeprazole (NEXIUM) 40 MG capsule Take 1 capsule (40 mg total) by mouth at bedtime.   FLOVENT HFA 110 MCG/ACT inhaler Inhale 1 puff into the lungs daily as needed (wheezing, shortness of breath).   fluticasone (FLONASE) 50 MCG/ACT nasal spray Place 1 spray into both nostrils daily.   furosemide (LASIX) 20 MG tablet Take 1 tablet (20 mg total) by mouth daily.   gabapentin (NEURONTIN) 100 MG capsule Take 1 capsule  (100 mg total) by mouth at bedtime.   metoprolol tartrate (LOPRESSOR) 25 MG tablet Take 1 tablet (25 mg total) by mouth daily.   nitroGLYCERIN (NITROSTAT) 0.4 MG SL tablet Place 1 tablet (0.4 mg total) under the tongue every 5 (five) minutes x 3 doses as needed for chest pain.   pravastatin (PRAVACHOL) 20 MG tablet Take 1 tablet (20 mg total) by mouth daily.   prednisoLONE acetate (PRED FORTE) 1 % ophthalmic suspension Place 1 drop into both eyes 2 (two) times daily.   sacubitril-valsartan (ENTRESTO) 24-26 MG Take 1 tablet by mouth daily.   senna-docusate (SENNA PLUS) 8.6-50 MG tablet Take 1 tablet by mouth daily as needed for mild constipation.   tiZANidine (ZANAFLEX) 4 MG tablet Take 1 tablet (4 mg total) by mouth every 6 (six) hours as needed for muscle spasms.   vortioxetine HBr (TRINTELLIX) 5 MG TABS tablet Take 0.5 tablets (2.5 mg total) by mouth daily.   No facility-administered encounter medications on file as of 04/12/2022.    Objective:   PHYSICAL EXAMINATION:    VITALS:   There were no vitals filed for this visit.     GEN:  The patient appears stated age and is in NAD. HEENT:  Normocephalic, atraumatic.  The mucous membranes are moist.   Neurological examination:  Orientation: The patient is alert and oriented x3. Cranial nerves: There is good facial symmetry with facial hypomimia. The speech is fluent and clear. Soft palate rises symmetrically and there is no tongue deviation. Hearing is intact to conversational tone. Sensation: Sensation is intact to light touch throughout Motor: Strength is at least antigravity x4.    Movement examination: Tone: There is nl increased tone in the LUE/LLE Abnormal movements: there is LUE/LLE rest tremor Coordination:  There is decremation, with any form of RAMS, including alternating supination and pronation of the forearm, hand opening and closing, finger taps, heel taps and toe taps on the L Gait and Station: The patient has mild  difficulty arising out of a deep-seated chair without the use of the hands. The patient's stride length is markedly decreased.  He is dragging the L leg    I have reviewed and interpreted the following labs independently    Chemistry      Component Value Date/Time   NA 142 03/31/2022 1708   K 4.5 03/31/2022 1708   CL 107 (H) 03/31/2022 1708   CO2 22 03/31/2022 1708   BUN 13 03/31/2022 1708   CREATININE 1.18 03/31/2022 1708   CREATININE 1.03 05/20/2015 1410      Component Value Date/Time   CALCIUM 8.9 03/31/2022 1708   ALKPHOS 85 03/31/2022 1708   AST 17 03/31/2022 1708   ALT 16 03/31/2022 1708   BILITOT 0.3 03/31/2022 1708       Lab Results  Component Value Date   WBC 8.2 03/31/2022   HGB 15.3 03/31/2022   HCT 44.5 03/31/2022   MCV 92 03/31/2022  PLT 199 03/31/2022    Lab Results  Component Value Date   TSH 1.910 03/31/2022     Total time spent on today's visit was *** minutes, including both face-to-face time and nonface-to-face time.  Time included that spent on review of records (prior notes available to me/labs/imaging if pertinent), discussing treatment and goals, answering patient's questions and coordinating care.  Cc:  [name], DO

## 2022-04-12 ENCOUNTER — Encounter: Payer: Self-pay | Admitting: Neurology

## 2022-04-12 ENCOUNTER — Ambulatory Visit (INDEPENDENT_AMBULATORY_CARE_PROVIDER_SITE_OTHER): Payer: Medicare Other | Admitting: Neurology

## 2022-04-12 VITALS — BP 106/62 | HR 72 | Ht 66.0 in | Wt 204.4 lb

## 2022-04-12 DIAGNOSIS — F330 Major depressive disorder, recurrent, mild: Secondary | ICD-10-CM | POA: Diagnosis not present

## 2022-04-12 DIAGNOSIS — G20 Parkinson's disease: Secondary | ICD-10-CM | POA: Diagnosis not present

## 2022-04-12 MED ORDER — RYTARY 48.75-195 MG PO CPCR: 1.0000 | ORAL_CAPSULE | Freq: Three times a day (TID) | ORAL | 1 refills | Status: DC

## 2022-04-12 NOTE — Patient Instructions (Addendum)
Increase rytary to 195 mg, 1 capsule at 7am/11am/4pm  Local and Online Resources for Power over Parkinson's Group September 2023  LOCAL  PARKINSON'S GROUPS  Power over Parkinson's Group:   Power Over Parkinson's Patient Education Group will be Wednesday, September 13th-*Hybrid meting*- in person at [facility] location and via [facility] at 2 pm.   Upcoming Power over [employer] Meetings:  2nd Wednesdays of the month at 2 pm:  September 13th, October 11th, November 8th Contact Amy Marriott at [email or web address] if interested in participating in this group Parkinson's Care Partners Group:    3rd Mondays, Contact Misty Paladino Atypical Parkinsonian Patient Group:   4th Wednesdays, [place] If you are interested in participating in these groups with Misty, please contact her directly for how to join those meetings.  Her contact information is [email or web address].    LOCAL EVENTS AND NEW OFFERINGS New PWR! Moves [employer] Instructor-Led Classes offering at [employer]!  Wednesdays 1-2 pm.   Contact Vonna Kotyk at  [email or web address] or Caron Presume at [place], [email or web address] Dance for Parkinson 's classes will be on Tuesdays 9:30am-10:30am starting October 3-December 12 with a break the week of November 21 . Located in the [employer]  which is in the first floor of the [employer] ([place] for Parkinson's will be held on 2nd and 4th Mondays at 11:00am . First class will start  September 25th.  Located at the [place] ([place].) Through support from the [place] and Drumming for Parkinson's classes are free for both patients and caregivers.  Contact Misty Taylor-Paladino for more details about registering.  [place]:  www.[email or web address] PD  Health at Home continues:  Mindfulness Mondays, Wellness Wednesdays, Fitness Fridays  Upcoming Education:   Navigating Nutrition with PD.  Wednesday, Sept. 6th 1:00-2:00 pm Understanding Mind and Memory.  Wednesday, Sept. 20th 1:00-2:00 pm  Expert Briefing:    Parkinson's Disease and the Bladder.  Wednesday, Sept. 13th 1:00-2:00 pm Parkinson's and the Gut-Brain Connection.  Wednesday, Oct. 11th 1:00-2:00 pm Register for expert briefings (webinars) at [email or web address] Please check out their website to sign up for emails and see their full online offerings   [place]:  [email or web address]  Third Thursday Webinars:  On the third Thursday of every month at 12 p.m. ET, join our free live webinars to learn about various aspects of living with Parkinson's disease and our work to speed medical breakthroughs. Upcoming Webinar:  Stay tuned Check out additional information on their website to see their full online offerings  [employer]:  [email or web address] Upcoming Webinar:   Stay tuned Webinar Series:  Living with Parkinson's Meetup.   Third Thursdays each month, 3 pm Care Partner Monthly Meetup.  With Robin Searing Phinney.  First Tuesday of each month, 2 pm Check out additional information to Live Well Today on their website  Parkinson and Movement Disorders (PMD) Alliance:  www.[email or web address] [employer] Online:  Online Education Events Sign up for emails, which are sent weekly to give you updates on programming and online offerings  Parkinson's Association of the [place]:  www.[email or web address] Information on online support groups, education events, and online exercises including Yoga, Parkinson's exercises and more-LOTS of information on links to PD resources and online events Virtual Support Group through Parkinson's Association of the [place]; next one is scheduled for  Wednesday, October 4th at 2 pm. (No September meeting due to the  symposium.  These are typically scheduled for the 1st Wednesday of the month at 2 pm).  Visit website for details. Register for "Caring for Parkinson's-Caring for You", 9th Annual Symposium.  In-person event in [place].  September 9th.  To register:  [email or web address]/symposium-[email or web address]/?blm_aid=[email or web address] MOVEMENT AND EXERCISE OPPORTUNITIES PWR! Moves Classes at [place].  Wednesdays 10 and 11 am.   Contact Amy Marriott, PT [email or web address] if interested. NEW PWR! Moves Class offerings at [employer].  Wednesdays 1-2 pm.  Contact Vonna Kotyk at  [email or web address] or Caron Presume at [place],  [email or web address] Parkinson's Wellness Recovery (PWR! Moves)  www.[email or web address] Info on the PWR! Virtual Experience:  You will have access to our expertise through self-assessment, guided plans that start with the PD-specific fundamentals, educational content, tips, Q&A with an expert, and a growing [employer] of PD-specific pre-recorded and live exercise classes of varying types and intensity - both physical and cognitive! If that is not enough, we offer 1:1 wellness consultations (in-person or virtual) to personalize your PWR! Art therapist.  [place] Fridays:  As part of the PD Health @ Home program, this free video series focuses each week on one aspect of fitness designed to support people living with Parkinson's.  These weekly videos highlight the [place] recent fitness guidelines for people with Parkinson's disease. [address] Dance for PD website is offering free, live-stream classes throughout the week, as well as links to [email or web address].si of classes:  [email or web address] Virtual dance and Pilates for Parkinson's classes: Click on the Community Tab> Parkinson's Movement  Initiative Tab.  To register for classes and for more information, visit www.[employer] and click the "community" tab.  [employer] Parkinson's Cycling Classes  [employer]:  Thursdays @ Noon-Live classes at 09-15-1987 ([employer] at [email or web address] or ([phone]) [employer]: Virtual Classes Mondays and Thursdays 09-15-1987 classes Tuesday, Wednesday and Thursday (contact [place] at [email or web address]  or [phone]) [place] Varied levels of classes are offered Tuesdays and Thursdays at [place].  Stretching with [facility] weekly class is also offered for people with Parkinson's To observe a class or for more information, call [phone] or email [phone] at [email or web address] ADDITIONAL SUPPORT AND RESOURCES Well[employer]:Online Caregiver Education Opportunities:  www.well-[email or web address]/caregiver-education/caregiver-support-group.  You may also contact Hezzie Bump at [email or web address] or [phone].    Coping with Difficult Caregiver Emotions.  Wednesday, September 20th, 10:30 am-12.  The [facility], [facility] Navigating the Maze of Senior Care Options.  Thursday, September 28th, 4-5:15 pm.  The [facility]. Well-Spring Navigator:  01-29-1999 program, a free service to help individuals and families through the journey of determining care for older adults.  The "Navigator" is a [facility], [employer], who will speak with a prospective client and/or loved ones to provide an assessment of the situation and a set of recommendations for a personalized care plan -- all free of charge, and whether Well[employer] offers the needed service or not. If the need is not a service we provide, we are well-connected with reputable programs in town that we can refer you to.  www.well-springsolutions.org or to speak with the Navigator, call  [phone].

## 2022-04-13 ENCOUNTER — Ambulatory Visit: Payer: Medicare Other | Attending: Cardiology

## 2022-04-13 DIAGNOSIS — I5022 Chronic systolic (congestive) heart failure: Secondary | ICD-10-CM

## 2022-04-13 LAB — ECHOCARDIOGRAM COMPLETE
Area-P 1/2: 6.17 cm2
S' Lateral: 4.9 cm

## 2022-04-13 MED ORDER — PERFLUTREN LIPID MICROSPHERE
1.0000 mL | INTRAVENOUS | Status: AC | PRN
  Administered 2022-04-13: 4 mL via INTRAVENOUS

## 2022-04-27 DIAGNOSIS — H16223 Keratoconjunctivitis sicca, not specified as Sjogren's, bilateral: Secondary | ICD-10-CM | POA: Diagnosis not present

## 2022-04-27 DIAGNOSIS — H0288B Meibomian gland dysfunction left eye, upper and lower eyelids: Secondary | ICD-10-CM | POA: Diagnosis not present

## 2022-04-27 DIAGNOSIS — H0288A Meibomian gland dysfunction right eye, upper and lower eyelids: Secondary | ICD-10-CM | POA: Diagnosis not present

## 2022-04-29 ENCOUNTER — Ambulatory Visit (INDEPENDENT_AMBULATORY_CARE_PROVIDER_SITE_OTHER): Payer: Medicare Other | Admitting: Family Medicine

## 2022-04-29 ENCOUNTER — Encounter: Payer: Self-pay | Admitting: Family Medicine

## 2022-04-29 VITALS — BP 112/68 | HR 88 | Temp 98.2°F | Ht 66.0 in | Wt 207.5 lb

## 2022-04-29 DIAGNOSIS — Z23 Encounter for immunization: Secondary | ICD-10-CM | POA: Diagnosis not present

## 2022-04-29 DIAGNOSIS — M5416 Radiculopathy, lumbar region: Secondary | ICD-10-CM | POA: Diagnosis not present

## 2022-04-29 DIAGNOSIS — F339 Major depressive disorder, recurrent, unspecified: Secondary | ICD-10-CM

## 2022-04-29 DIAGNOSIS — J309 Allergic rhinitis, unspecified: Secondary | ICD-10-CM | POA: Diagnosis not present

## 2022-04-29 DIAGNOSIS — E7800 Pure hypercholesterolemia, unspecified: Secondary | ICD-10-CM

## 2022-04-29 MED ORDER — BUPROPION HCL ER (XL) 150 MG PO TB24: 150.0000 mg | ORAL_TABLET | Freq: Every day | ORAL | 2 refills | Status: DC

## 2022-04-29 MED ORDER — LEVOCETIRIZINE DIHYDROCHLORIDE 5 MG PO TABS: 5.0000 mg | ORAL_TABLET | Freq: Every evening | ORAL | 2 refills | Status: DC

## 2022-04-29 MED ORDER — FLUTICASONE PROPIONATE 50 MCG/ACT NA SUSP: 2.0000 | Freq: Every day | NASAL | 6 refills | Status: DC

## 2022-04-29 NOTE — Progress Notes (Signed)
Chief Complaint  Patient presents with   Follow-up    6 month     Subjective: Hyperlipidemia Patient presents for Hyperlipidemia follow up. Currently taking pravastatin 20 mg/d and compliance with treatment thus far has been good. He denies myalgias. He is adhering to a healthy diet. Exercise: golfing, cardio, lifting wts The patient is not known to have coexisting coronary artery disease. No Cp or SOB.  Depression Currently treated w Trintellix 2.5 mg/d. Compliant w medication, it makes him sleepy and gives him strange dreams. No self medication. No HI or SI. Not following w a therapist/counselor.   He continues to have low back pain.  He was sent to a neurosurgeon who is doing injections but he never followed up.  He is interested in going back as his pain continues.  No recent injury or change in activity.  Gait is slow/cautious due to Parkinson's disease.  Patient has history of a runny/stuffy nose that opened up with Flonase but he had worse runny nose afterwards.  He does not take an oral antihistamine.  This is been going on for several years.  It causes him to clear his throat as drainage goes posteriorly as well.  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   Diarrhea 11/04/2020   Elevated PSA    Erectile dysfunction    Essential hypertension    Essential tremor    GERD (gastroesophageal reflux disease)    Headache(784.0)    Hiatal hernia    Hypercholesterolemia    Long-term use of aspirin therapy    Low back pain 10/28/2019   Major depression, chronic    Medial meniscus tear 5/57/3220   Metabolic syndrome    Morbid obesity ([place])    Myofascial pain 12/20/2019   Nephrolithiasis    hx of   NSTEMI (non-ST elevated myocardial infarction) ([place])    Parkinson's disease ([place])    S/P CABG (coronary artery bypass graft)     Transient ischemic attack    hx of   Trochanteric bursitis of right hip     Objective: BP 112/68 (BP Location: Left Arm, Patient Position: Sitting, Cuff Size: Normal)   Pulse 88   Temp 98.2 F (36.8 C) (Oral)   Ht '5\' 6"'$  (1.676 m)   Wt 207 lb 8 oz (94.1 kg)   SpO2 99%   BMI 33.49 kg/m  General: Awake, appears stated age HEENT: MMM, no pharyngeal exudate or erythema, nares are patent with clear rhinorrhea bilaterally, no sinus TTP, PERRLA without scleral injection Heart: RRR, no LE edema, no bruits Lungs: CTAB, no rales, wheezes or rhonchi. No accessory muscle use Neuro: Resting tremor noted, gait is slow and cautious Psych: Age appropriate judgment and insight, normal affect and mood  Assessment and Plan: Hypercholesterolemia  Depression, recurrent ([place]) - Plan: buPROPion (WELLBUTRIN XL) 150 MG 24 hr tablet  Lumbar radiculopathy - Plan: Ambulatory referral to Neurosurgery  Allergic rhinitis, unspecified seasonality, unspecified trigger - Plan: fluticasone (FLONASE) 50 MCG/ACT nasal spray, levocetirizine (XYZAL) 5 MG tablet  Need for influenza vaccination - Plan: Flu Vaccine QUAD High Dose(Fluad)  Chronic, stable.  Counseled on diet and exercise.  Continue pravastatin 20 mg daily. Chronic, uncontrolled.  Stop Trintellix due to adverse effects.  Start bupropion XL 150 mg daily.  Follow-up in 1 month to recheck this. Chronic, uncontrolled.  Refer  to neurosurgery again. Chronic, uncontrolled.  Continue intranasal corticosteroid.  Add Xyzal 5 mg daily.  Follow-up in 1 month to recheck this as well.  Could consider adding singular 10 mg daily if no improvement. High-dose flu shot today. The patient voiced understanding and agreement to the plan.  [place], DO 04/29/22  3:29 PM

## 2022-04-29 NOTE — Patient Instructions (Signed)
Keep the diet clean and stay active.  If you do not hear anything about your referral in the next 1-2 weeks, call our office and ask for an update.  Let us know if you need anything.  

## 2022-05-05 ENCOUNTER — Ambulatory Visit (INDEPENDENT_AMBULATORY_CARE_PROVIDER_SITE_OTHER): Payer: Medicare Other | Admitting: Psychologist

## 2022-05-05 DIAGNOSIS — F331 Major depressive disorder, recurrent, moderate: Secondary | ICD-10-CM | POA: Diagnosis not present

## 2022-05-05 NOTE — Progress Notes (Signed)
                Parnell Spieler, PsyD 

## 2022-05-05 NOTE — Progress Notes (Signed)
[facility] Counselor Initial Adult Exam  Name: Shawn Meza Date: 05/05/2022 MRN: [id number] DOB: 24-Apr-1946 PCP: [name], DO  Time spent: 11:02 am to 11:42 am; total time: 40 minutes  This session was held via in person. The patient consented to in-person therapy and was in the clinician's office. Limits of confidentiality were discussed with the patient.   Guardian/Payee:  NA    Paperwork requested: No   Reason for Visit /Presenting Problem: Depression  Mental Status Exam: Appearance:   Well Groomed     Behavior:  Appropriate  Motor:  Normal  Speech/Language:   Clear and Coherent  Affect:  Appropriate  Mood:  normal  Thought process:  normal  Thought content:    WNL  Sensory/Perceptual disturbances:    WNL  Orientation:  oriented to person, place, and time/date  Attention:  Good  Concentration:  Good  Memory:  WNL  Fund of knowledge:   Good  Insight:    Fair  Judgment:   Good  Impulse Control:  Good    Reported Symptoms:  The patient endorsed experiencing the following: feeling down, sad, rumination of negative thoughts, social isolation, avoiding pleasurable activities, low self-esteem, fatigue, and lack of motivation. He denied suicidal and homicidal ideation.   Risk Assessment: Danger to Self:  No Self-injurious Behavior: No Danger to Others: No Duty to Warn:no Physical Aggression / Violence:No  Access to Firearms a concern: No  Gang Involvement:No  Patient / guardian was educated about steps to take if suicide or homicide risk level increases between visits: n/a While future psychiatric events cannot be accurately predicted, the patient does not currently require acute inpatient psychiatric care and does not currently meet [facility] involuntary commitment criteria.  Substance Abuse History: Current substance abuse: No     Past Psychiatric History:   Previous psychological history is significant for  depression Outpatient Providers:Dr. Pace History of Psych Hospitalization: No  Psychological Testing:  NA    Abuse History:  Victim of: No.,  NA    Report needed: No. Victim of Neglect:No. Perpetrator of  NA   Witness / Exposure to Domestic Violence: No   Protective Services Involvement: No  Witness to [employer] Violence:  No   Family History:  Family History  Problem Relation Age of Onset   Heart disease Mother    Heart disease Father    Hyperlipidemia Father    Stroke Father    Hyperlipidemia Brother    Heart disease Brother    Coronary artery disease Other        family hx of male 1st degree relative ,81   Hyperlipidemia Other        family hx of   Hypertension Other        family hx of   Arthritis Other        family hx of   Healthy Daughter    Dementia Neg Hx     Living situation: the patient lives alone  Sexual Orientation: Straight  Relationship Status: divorced  Name of spouse / other:NA. Was married twice previously. The second marriage to Tammy lasted 39 years.  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   Diarrhea 11/04/2020   Elevated PSA    Erectile dysfunction    Essential hypertension    Essential tremor    GERD (gastroesophageal reflux disease)    Headache(784.0)    Hiatal hernia    Hypercholesterolemia    Long-term use of aspirin therapy    Low back pain 10/28/2019   Major depression, chronic    Medial meniscus tear 3/71/6967   Metabolic syndrome    Morbid obesity ([place])    Myofascial pain 12/20/2019   Nephrolithiasis    hx of   NSTEMI (non-ST elevated myocardial infarction) ([place])    Parkinson's disease ([place])    S/P CABG (coronary artery bypass graft)    Transient ischemic attack    hx of   Trochanteric bursitis of right hip     Past Surgical History:  Procedure Laterality Date   CARDIAC CATHETERIZATION  5/12,1/13   4 stents placed   COLON SURGERY     CORONARY ARTERY BYPASS GRAFT     KNEE ARTHROSCOPY  10/11/2011   Procedure: ARTHROSCOPY KNEE;  Surgeon: Lorn Junes, MD;  Location: [facility];  Service: Orthopedics;  Laterality: Left;  Left Knee Arthroscopy with Medial and Lateral Partial Menisectomy, Chondroplasty   LEFT HEART CATHETERIZATION WITH CORONARY ANGIOGRAM N/A 08/25/2011   Procedure: LEFT HEART CATHETERIZATION WITH CORONARY ANGIOGRAM;  Surgeon: Burnell Blanks, MD;  Location: [facility] CATH LAB;  Service: Cardiovascular;  Laterality: N/A;   LITHOTRIPSY     STERIOD INJECTION  10/11/2011   Procedure: STEROID INJECTION;  Surgeon: Lorn Junes, MD;  Location: [facility];  Service: Orthopedics;  Laterality: Right;  Steroid Injection Second Toe   TRANSURETHRAL RESECTION OF PROSTATE     URETHRAL DILATION      Medications: Current Outpatient Medications  Medication Sig Dispense Refill   acetaminophen (TYLENOL) 325 MG tablet Take 162.5 mg by mouth every 6 (six) hours as needed.     aspirin 81 MG EC tablet Take 1 tablet (81 mg total) by mouth daily. 90 tablet 3   buPROPion (WELLBUTRIN XL) 150 MG 24 hr tablet Take 1  tablet (150 mg total) by mouth daily. 30 tablet 2   Carbidopa-Levodopa ER (RYTARY) 48.75-195 MG CPCR Take 1 capsule by mouth 3 (three) times daily. 7am/11am/4pm 270 capsule 1   esomeprazole (NEXIUM) 40 MG capsule Take 1 capsule (40 mg total) by mouth at bedtime. 90 capsule 3   FLOVENT HFA 110 MCG/ACT inhaler Inhale 1 puff into the lungs daily as needed (wheezing, shortness of breath). 1 each 3   fluticasone (FLONASE) 50 MCG/ACT nasal spray Place 2 sprays into both nostrils daily. 16 g 6   furosemide (LASIX) 20 MG tablet Take 1 tablet (20 mg total) by mouth daily. 90 tablet 3   gabapentin (NEURONTIN) 100 MG capsule Take 1 capsule (100 mg total) by mouth at bedtime. 180 capsule 1   levocetirizine (XYZAL) 5 MG tablet Take 1 tablet (5 mg total) by mouth every evening. [place]  tablet 2   metoprolol tartrate (LOPRESSOR) 25 MG tablet Take 1 tablet (25 mg total) by mouth daily. 90 tablet 2   nitroGLYCERIN (NITROSTAT) 0.4 MG SL tablet Place 1 tablet (0.4 mg total) under the tongue every 5 (five) minutes x 3 doses as needed for chest pain. 25 tablet 3   pravastatin (PRAVACHOL) 20 MG tablet Take 1 tablet (20 mg total) by mouth daily. 90 tablet 1   prednisoLONE acetate (PRED FORTE) 1 % ophthalmic suspension Place 1 drop into both eyes 2 (two) times daily.     sacubitril-valsartan (ENTRESTO) 24-26 MG Take 1 tablet by mouth daily. 90 tablet 2   senna-docusate (SENNA PLUS) 8.6-50 MG tablet Take 1 tablet by mouth daily as needed for mild constipation.     No current facility-administered medications for this visit.    Allergies  Allergen Reactions   Rosuvastatin Other (See Comments)    Whole body aches   Testosterone Other (See Comments)    ABDOMINAL PAIN and cramping   Fluoxetine Other (See Comments)    Caused depression and aggression   Requip [Ropinirole] Nausea Only   Tizanidine Hcl Hives    Diagnoses:  F33.1 major depressive affective disorder, recurrent, moderate  Plan of Care: The patient is a 76  year old Caucasian male who was referred due to experiencing depression. The patient lives with his two dogs. The patient meets criteria for a diagnosis of F33.1 major depressive affective disorder, recurrent, moderate based off of the following: feeling down, sad, rumination of negative thoughts, social isolation, avoiding pleasurable activities, low self-esteem, fatigue, and lack of motivation. He denied suicidal and homicidal ideation.   The patient stated that he wants to feel happy again.  This psychologist makes the recommendation to participate in therapy bi-weekly if possible   Conception Chancy, PsyD

## 2022-05-05 NOTE — Plan of Care (Signed)

## 2022-05-11 DIAGNOSIS — M47816 Spondylosis without myelopathy or radiculopathy, lumbar region: Secondary | ICD-10-CM | POA: Diagnosis not present

## 2022-05-23 ENCOUNTER — Telehealth: Payer: Self-pay

## 2022-05-23 DIAGNOSIS — I5022 Chronic systolic (congestive) heart failure: Secondary | ICD-10-CM

## 2022-05-23 MED ORDER — TORSEMIDE 20 MG PO TABS: 20.0000 mg | ORAL_TABLET | Freq: Two times a day (BID) | ORAL | 3 refills | Status: DC

## 2022-05-23 MED ORDER — SPIRONOLACTONE 25 MG PO TABS: 25.0000 mg | ORAL_TABLET | Freq: Every day | ORAL | 3 refills | Status: DC

## 2022-05-23 NOTE — Telephone Encounter (Signed)
-----   Message from Truddie Hidden, RN sent at 04/14/2022  1:53 PM EDT -----  ----- Message ----- From: Richardo Priest, MD Sent: 04/14/2022   7:53 AM EDT To: [facility]y [facility]/Hp Triage  Heart muscle function has worsened since his previous echocardiogram, it is now severe.  To alleviate his shortness of breath and like him to stop furosemide start torsemide 20 mg daily as well as spironolactone 25 mg/day  Weigh daily record his weight 2 weeks check BMP and proBNP in our office bring a list of his weights with him that should improve his shortness of breath.

## 2022-05-25 DIAGNOSIS — M47816 Spondylosis without myelopathy or radiculopathy, lumbar region: Secondary | ICD-10-CM | POA: Diagnosis not present

## 2022-05-25 DIAGNOSIS — G20C Parkinsonism, unspecified: Secondary | ICD-10-CM | POA: Diagnosis not present

## 2022-05-27 ENCOUNTER — Encounter: Payer: Self-pay | Admitting: Family Medicine

## 2022-05-27 ENCOUNTER — Ambulatory Visit (INDEPENDENT_AMBULATORY_CARE_PROVIDER_SITE_OTHER): Payer: Medicare Other | Admitting: Family Medicine

## 2022-05-27 VITALS — BP 136/72 | HR 84 | Temp 99.0°F | Ht 66.0 in | Wt 203.4 lb

## 2022-05-27 DIAGNOSIS — F339 Major depressive disorder, recurrent, unspecified: Secondary | ICD-10-CM

## 2022-05-27 DIAGNOSIS — J309 Allergic rhinitis, unspecified: Secondary | ICD-10-CM

## 2022-05-27 DIAGNOSIS — I5022 Chronic systolic (congestive) heart failure: Secondary | ICD-10-CM

## 2022-05-27 MED ORDER — BUPROPION HCL 75 MG PO TABS: 75.0000 mg | ORAL_TABLET | Freq: Two times a day (BID) | ORAL | 5 refills | Status: DC

## 2022-05-27 NOTE — Progress Notes (Signed)
Chief Complaint  Patient presents with   Follow-up    Subjective Shawn Meza presents for f/u depression.  Pt is currently being treated with bupropion 75 mg/d.  Reports doing better since treatment. No thoughts of harming self or others. No self-medication with alcohol, prescription drugs or illicit drugs. Pt is not following with a counselor/psychologist.  Seasonal allergies Was taking Flonase daily. Started on Xyzal 5 mg/d. Reports compliance, no AE's. Feels that his sinuses had cleared up for the first time in years. No fevers.   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   Diarrhea 11/04/2020   Elevated PSA    Erectile dysfunction    Essential hypertension    Essential tremor    GERD (gastroesophageal reflux disease)    Headache(784.0)    Hiatal hernia    Hypercholesterolemia    Long-term use of aspirin therapy    Low back pain 10/28/2019   Major depression, chronic    Medial meniscus tear 6/96/7893   Metabolic syndrome    Morbid obesity ([place])    Myofascial pain 12/20/2019   Nephrolithiasis    hx of   NSTEMI (non-ST elevated myocardial infarction) ([place])    Parkinson's disease    S/P CABG (coronary artery bypass graft)    Transient ischemic attack    hx of   Trochanteric bursitis of right hip    Allergies as of 05/27/2022       Reactions   Rosuvastatin Other (See Comments)   Whole body aches   Testosterone Other (See Comments)   ABDOMINAL PAIN and cramping   Fluoxetine Other (See Comments)   Caused depression and aggression   Requip [ropinirole] Nausea Only   Tizanidine Hcl Hives        Medication List        Accurate as of May 27, 2022  2:17 PM. If you have any questions, ask your nurse or doctor.          STOP taking these medications    buPROPion 150 MG 24 hr tablet Commonly known  as: Wellbutrin XL Replaced by: buPROPion 75 MG tablet Stopped by: [name], DO       TAKE these medications    acetaminophen 325 MG tablet Commonly known as: TYLENOL Take 162.5 mg by mouth every 6 (six) hours as needed.   aspirin EC 81 MG tablet Take 1 tablet (81 mg total) by mouth daily.   buPROPion 75 MG tablet Commonly known as: WELLBUTRIN Take 1 tablet (75 mg total) by mouth 2 (two) times daily. Replaces: buPROPion 150 MG 24 hr tablet Started by: Crosby Oyster Nannette Zill, DO   Entresto 24-26 MG Generic drug: sacubitril-valsartan Take 1 tablet by mouth daily.   esomeprazole 40 MG capsule Commonly known as: NEXIUM Take 1 capsule (40 mg total) by mouth at bedtime.   Flovent HFA 110 MCG/ACT inhaler Generic drug: fluticasone Inhale 1 puff into the lungs daily as needed (wheezing, shortness of breath).   fluticasone 50 MCG/ACT nasal spray Commonly known as: FLONASE Place 2 sprays into both nostrils daily.   gabapentin 100 MG capsule Commonly known as: NEURONTIN Take 1 capsule (100 mg total) by mouth at bedtime.   levocetirizine 5 MG tablet Commonly known as: XYZAL Take 1 tablet (5 mg total) by mouth every evening.   metoprolol tartrate 25  MG tablet Commonly known as: LOPRESSOR Take 1 tablet (25 mg total) by mouth daily.   nitroGLYCERIN 0.4 MG SL tablet Commonly known as: NITROSTAT Place 1 tablet (0.4 mg total) under the tongue every 5 (five) minutes x 3 doses as needed for chest pain.   pravastatin 20 MG tablet Commonly known as: PRAVACHOL Take 1 tablet (20 mg total) by mouth daily.   prednisoLONE acetate 1 % ophthalmic suspension Commonly known as: PRED FORTE Place 1 drop into both eyes 2 (two) times daily.   Rytary 48.75-195 MG Cpcr Generic drug: Carbidopa-Levodopa ER Take 1 capsule by mouth 3 (three) times daily. 7am/11am/4pm   Senna Plus 8.6-50 MG tablet Generic drug: senna-docusate Take 1 tablet by mouth daily as needed for mild  constipation.   spironolactone 25 MG tablet Commonly known as: ALDACTONE Take 1 tablet (25 mg total) by mouth daily.   torsemide 20 MG tablet Commonly known as: DEMADEX Take 1 tablet (20 mg total) by mouth 2 (two) times daily.        Exam BP 136/72 (BP Location: Left Arm, Patient Position: Sitting, Cuff Size: Normal)   Pulse 84   Temp 99 F (37.2 C) (Oral)   Ht '5\' 6"'$  (1.676 m)   Wt 203 lb 6 oz (92.3 kg)   SpO2 96%   BMI 32.83 kg/m  General:  well developed, well nourished, in no apparent distress Heent: Canals are patent bilaterally, TMs negative bilaterally. Nares are patent without rhinorrhea.  MMM. Heart: RRR Lungs: Clear to auscultation bilaterally.  No respiratory distress Psych: well oriented with normal range of affect and age-appropriate judgement/insight, alert and oriented x4.  Assessment and Plan  Depression, recurrent ([place])  Allergic rhinitis, unspecified seasonality, unspecified trigger  Chronic systolic (congestive) heart failure ([place]) - Plan: Ambulatory referral to Cardiology  Chronic, stable.  Changed to Wellbutrin 75 mg twice daily. Chronic, stable.  Continue Flonase and Xyzal 5 mg daily. He is not pleased with his current cardiologist office due to poor communication and requests a second opinion.  We will place referral today. He also brought up me approving him for a medical recliner.  Given his heart failure, I do not have a problem doing this, but logistically unsure what he needs to have done so he will reach out to his insurance company to find out. Follow-up in 6 months or as needed. The patient voiced understanding and agreement to the plan.  [place], DO 05/27/22 2:17 PM

## 2022-05-27 NOTE — Patient Instructions (Signed)
Call your insurance company to see the process for getting a recliner covered. I am not opposed to doing this, but have never done it before.  If you do not hear anything about your referral in the next 1-2 weeks, call our office and ask for an update.  Let us know if you need anything.

## 2022-06-01 ENCOUNTER — Telehealth: Payer: Self-pay | Admitting: Cardiology

## 2022-06-01 NOTE — Telephone Encounter (Signed)
Patient requesting to switch from Dr. [name] to Dr. Agustin Cree.

## 2022-06-03 ENCOUNTER — Telehealth: Payer: Self-pay

## 2022-06-03 ENCOUNTER — Other Ambulatory Visit: Payer: Self-pay

## 2022-06-03 DIAGNOSIS — E782 Mixed hyperlipidemia: Secondary | ICD-10-CM

## 2022-06-03 NOTE — Patient Outreach (Signed)
  Care Coordination   Initial Visit Note   06/03/2022 Name: Shawn Meza MRN: [id number] DOB: [birth year]  Shawn Meza is a 76 y.o. year old male who sees [name], Crosby Oyster, DO for primary care. I spoke with  [name] by phone today.   What matters to the patients health and wellness today?  Patient returned call to RN. He states he is looking to find companies that supply a lift chair. He states he called his insurance provider and was informed that a lift chair is covered. Mr. Golla also request a [facility] calendar be sent if possible.   Goals Addressed             This Visit's Progress    Care Coordination: Community resource needs       Care Coordination Interventions: Provided [facility] Calendar via mail Care Guide referral for food resources Provided contact information for DME companies: [place] [phone]; [place] [phone] and [place] [phone]  Patient encouraged to contact insurance company to find out what DME companies are in the network and what exactly is covered and how much would be covered Reinforced with patient, that if covered by insurance, patient will need a prescription either written or faxed to the DME company of patient's choice and reinforced the the DME company would assist with any particulars that are needed to process the request.      SDOH assessments and interventions completed:  Yes  SDOH Interventions Today    Flowsheet Row Most Recent Value  SDOH Interventions   Food Insecurity Interventions Other (Comment)  [referral to care guide]  Housing Interventions Intervention Not Indicated  Transportation Interventions Intervention Not Indicated  Utilities Interventions Intervention Not Indicated     Care Coordination Interventions Activated:  Yes  Care Coordination Interventions:  Yes, provided   Follow up plan: Follow up call scheduled for 06/15/22    Encounter Outcome:  Pt.  Visit Completed   Thea Silversmith, RN, MSN, BSN, [place] Coordinator [phone]

## 2022-06-03 NOTE — Patient Outreach (Signed)
Patient called in to speak with LCSW. Sent message on patients behalf.   [place] Management Assistant [phone]

## 2022-06-03 NOTE — Patient Instructions (Addendum)
Visit Information  Thank you for taking time to visit with me today. Please don't hesitate to contact me if I can be of assistance to you.   Following are the goals we discussed today:   Goals Addressed             This Visit's Progress    Care Coordination: Community resource needs       Care Coordination Interventions: Provided [facility] Calendar via mail Care Guide referral for food resources Provided contact information for DME companies: [place] [phone]; [place] [phone] and [place] [phone]  Patient encouraged to contact insurance company to find out what DME companies are in the network and what exactly is covered and how much would be covered Reinforced with patient, that if covered by insurance, patient will need a prescription either written or faxed to the DME company of patient's choice and reinforced the the DME company would assist with any particulars that are needed to process the request.      Our next appointment is by telephone on 06/15/22 at 2:30pm  Please call the care guide team at [phone] if you need to cancel or reschedule your appointment.   If you are experiencing a Mental Health or [place] or need someone to talk to, please call the Suicide and Crisis Lifeline: 988  Patient verbalizes understanding of instructions and care plan provided today and agrees to view in [place]. Active MyChart status and patient understanding of how to access instructions and care plan via MyChart confirmed with patient.     Thea Silversmith, RN, MSN, BSN, [place] Coordinator [phone]

## 2022-06-03 NOTE — Patient Outreach (Signed)
  Care Coordination   06/03/2022 Name: Shawn Meza MRN: [id number] DOB: [birth year]   Care Coordination Outreach Attempts:  An unsuccessful telephone outreach was attempted today to offer the patient information about available care coordination services as a benefit of their health plan.  Care Coordinator received message that patient called inquiring about a chair lift.  Follow Up Plan:  Additional outreach attempts will be made to offer the patient care coordination information and services.   Encounter Outcome:  No Answer  Care Coordination Interventions Activated:  No   Care Coordination Interventions:  No, not indicated    Thea Silversmith, RN, MSN, BSN, [place] Coordinator [phone]

## 2022-06-06 ENCOUNTER — Telehealth: Payer: Self-pay | Admitting: *Deleted

## 2022-06-06 NOTE — Telephone Encounter (Signed)
   Telephone encounter was:  Successful.  06/06/2022 Name: Shawn Meza MRN: [id number] DOB: 03-15-1946  Shawn Meza is a 76 y.o. year old male who is a primary care patient of [name], DO . The community resource team was consulted for assistance with Food Insecurity patient would not talk to me said THn had been calling to much and he was sick and did not want to be bothered  Care guide performed the following interventions: Patient provided with information about care guide support team and interviewed to confirm resource needs.     Follow Up Plan:  No further follow up planned at this time. The patient has been provided with needed resources. [place] [phone] [address] , [place] [address] Email : [email or web address]

## 2022-06-06 NOTE — Telephone Encounter (Signed)
LVM informing patient switch was approved.

## 2022-06-15 ENCOUNTER — Telehealth: Payer: Self-pay

## 2022-06-15 NOTE — Patient Outreach (Signed)
  Care Coordination   06/15/2022 Name: Shawn Meza MRN: [id number] DOB: [birth year]   Care Coordination Outreach Attempts:  An unsuccessful telephone outreach was attempted for a scheduled appointment today.  Follow Up Plan:  Additional outreach attempts will be made to offer the patient care coordination information and services.   Encounter Outcome:  No Answer  Care Coordination Interventions Activated:  No   Care Coordination Interventions:  No, not indicated    Thea Silversmith, RN, MSN, BSN, [place] Coordinator [phone]

## 2022-06-16 ENCOUNTER — Ambulatory Visit: Payer: Medicare Other | Admitting: Psychologist

## 2022-06-22 ENCOUNTER — Ambulatory Visit (INDEPENDENT_AMBULATORY_CARE_PROVIDER_SITE_OTHER): Payer: Medicare Other | Admitting: Psychology

## 2022-06-22 DIAGNOSIS — F331 Major depressive disorder, recurrent, moderate: Secondary | ICD-10-CM
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
## 2022-06-28 DIAGNOSIS — M47816 Spondylosis without myelopathy or radiculopathy, lumbar region: Secondary | ICD-10-CM | POA: Diagnosis not present

## 2022-06-29 DIAGNOSIS — M47816 Spondylosis without myelopathy or radiculopathy, lumbar region: Secondary | ICD-10-CM

## 2022-06-29 HISTORY — DX: Spondylosis without myelopathy or radiculopathy, lumbar region: M47.816

## 2022-06-30 ENCOUNTER — Ambulatory Visit (INDEPENDENT_AMBULATORY_CARE_PROVIDER_SITE_OTHER): Payer: Medicare Other | Admitting: Psychology

## 2022-06-30 DIAGNOSIS — F331 Major depressive disorder, recurrent, moderate: Secondary | ICD-10-CM

## 2022-06-30 HISTORY — DX: Major depressive disorder, recurrent, moderate: F33.1
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
## 2022-07-01 ENCOUNTER — Ambulatory Visit: Payer: Medicare Other | Attending: Cardiology | Admitting: Cardiology

## 2022-07-01 ENCOUNTER — Encounter: Payer: Self-pay | Admitting: Cardiology

## 2022-07-01 VITALS — BP 136/74 | HR 89 | Ht 66.0 in | Wt 201.2 lb

## 2022-07-01 DIAGNOSIS — I5022 Chronic systolic (congestive) heart failure: Secondary | ICD-10-CM

## 2022-07-01 DIAGNOSIS — G20A1 Parkinson's disease without dyskinesia, without mention of fluctuations: Secondary | ICD-10-CM | POA: Diagnosis not present

## 2022-07-01 DIAGNOSIS — R0609 Other forms of dyspnea: Secondary | ICD-10-CM | POA: Diagnosis not present

## 2022-07-01 DIAGNOSIS — I10 Essential (primary) hypertension: Secondary | ICD-10-CM

## 2022-07-01 DIAGNOSIS — Z951 Presence of aortocoronary bypass graft: Secondary | ICD-10-CM | POA: Diagnosis not present

## 2022-07-01 DIAGNOSIS — I255 Ischemic cardiomyopathy: Secondary | ICD-10-CM

## 2022-07-01 NOTE — Patient Instructions (Signed)
Medication Instructions:  Your physician recommends that you continue on your current medications as directed. Please refer to the Current Medication list given to you today.  *If you need a refill on your cardiac medications before your next appointment, please call your pharmacy*   Lab Work: BMP, ProBNP- today If you have labs (blood work) drawn today and your tests are completely normal, you will receive your results only by: [place] (if you have MyChart) OR A paper copy in the mail If you have any lab test that is abnormal or we need to change your treatment, we will call you to review the results.   Testing/Procedures: None Ordered   Follow-Up: At [facility], you and your health needs are our priority.  As part of our continuing mission to provide you with exceptional heart care, we have created designated Provider Care Teams.  These Care Teams include your primary Cardiologist (physician) and Advanced Practice Providers (APPs -  Physician Assistants and Nurse Practitioners) who all work together to provide you with the care you need, when you need it.  We recommend signing up for the patient portal called "MyChart".  Sign up information is provided on this After Visit Summary.  MyChart is used to connect with patients for Virtual Visits (Telemedicine).  Patients are able to view lab/test results, encounter notes, upcoming appointments, etc.  Non-urgent messages can be sent to your provider as well.   To learn more about what you can do with MyChart, go to [email or web address].    Your next appointment:   3 month(s)  The format for your next appointment:   In Person  Provider:   [name], MD    Other Instructions NA

## 2022-07-01 NOTE — Progress Notes (Unsigned)
Cardiology Office Note:    Date:  07/01/2022   ID:  Shawn Meza, DOB September 26, 1945, MRN [id number]  PCP:  [name], DO  Cardiologist:  [name], MD    Referring MD: [name]*   Chief Complaint  Patient presents with   Establish Care    Previous patient of Dr. [name].     History of Present Illness:    Shawn Meza is a 76 y.o. male with a very sad story, medical history of coronary artery disease, status post coronary artery bypass graft done in March 2019, hypertensive heart disease, ejection fraction previously 35 to 40% now deterioration to 25%, hyperlipidemia, Parkinson disease, autonomic dysfunction with orthostatic hypotension, he lives alone with 2 dogs.  He is wife left him after being with him for 37 years.  He barely can manage.  He wanted to be established with me as a patient since his previous physician Dr. [name] is retiring.  He is doing poorly he still able to manage activities of daily living with no difficulties.  I could clearly see some issue with hygiene.  He got dirty close on him.  He denies having any chest pain tightness squeezing pressure mid chest biggest complaint is back issue.  Denies have any swelling of lower extremities.  Last time Dr. [name] gave him a little more diuretic and he did not like it.  He said he was urinating all day and next day he could not get off the bed.  He does not know what medication he is taking his tell me today he takes only 2 medication wants to improve function of the heart and second he is not sure what it is.  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   Diarrhea 11/04/2020   Elevated PSA    Erectile dysfunction     Essential hypertension    Essential tremor    GERD (gastroesophageal reflux disease)    Headache(784.0)    Hiatal hernia    Hypercholesterolemia    Long-term use of aspirin therapy    Low back pain 10/28/2019   Major depression, chronic    Medial meniscus tear 4/31/5400   Metabolic syndrome    Morbid obesity ([place])    Myofascial pain 12/20/2019   Nephrolithiasis    hx of   NSTEMI (non-ST elevated myocardial infarction) ([place])    Parkinson's disease    S/P CABG (coronary artery bypass graft)    Transient ischemic attack    hx of   Trochanteric bursitis of right hip     Past Surgical History:  Procedure Laterality Date   CARDIAC CATHETERIZATION  5/12,1/13   4 stents placed   COLON SURGERY     CORONARY ARTERY BYPASS GRAFT     KNEE ARTHROSCOPY  10/11/2011   Procedure: ARTHROSCOPY KNEE;  Surgeon: Lorn Junes, MD;  Location: [place];  Service: Orthopedics;  Laterality: Left;  Left Knee Arthroscopy with Medial and Lateral Partial Menisectomy, Chondroplasty   LEFT HEART CATHETERIZATION WITH CORONARY ANGIOGRAM N/A 08/25/2011   Procedure: LEFT HEART CATHETERIZATION WITH CORONARY ANGIOGRAM;  Surgeon: Burnell Blanks, MD;  Location: [facility] CATH LAB;  Service: Cardiovascular;  Laterality: N/A;   LITHOTRIPSY     STERIOD INJECTION  10/11/2011   Procedure: STEROID INJECTION;  Surgeon: Lorn Junes, MD;  Location: [place];  Service: Orthopedics;  Laterality: Right;  Steroid Injection Second Toe   TRANSURETHRAL RESECTION OF PROSTATE     URETHRAL DILATION      Current Medications: No outpatient medications have been marked as taking for the 07/01/22 encounter (Office Visit) with Park Liter, MD.     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  Tobacco Use   Smoking status: Never    Passive exposure: Never   Smokeless tobacco: Never  Vaping Use   Vaping Use: Never used  Substance and Sexual Activity   Alcohol use: No   Drug use: No   Sexual activity: Not Currently  Other Topics Concern   Not on file  Social History Narrative   Divorced 2016   Caffeine use: none       Social Determinants of Health   Financial Resource Strain: Low Risk  (09/30/2021)   Overall Financial Resource Strain (CARDIA)    Difficulty of Paying Living Expenses: Not hard at all  Food Insecurity: No Food Insecurity (06/03/2022)   Hunger Vital Sign    Worried About Running Out of Food in the Last Year: Never true    [place] in the Last Year: Never true  Transportation Needs: No Transportation Needs (06/03/2022)   PRAPARE - Hydrologist (Medical): No    Lack of Transportation (Non-Medical): No  Physical Activity: Inactive (09/30/2021)   Exercise Vital Sign    Days of Exercise per Week: 0 days    Minutes of Exercise per Session: 0 min  Stress: Stress Concern Present (09/30/2021)   [place]    Feeling of Stress : Rather much  Social Connections: Moderately Isolated (09/30/2021)   Social Connection and Isolation Panel [NHANES]    Frequency of Communication with Friends and Family: More than three times a week    Frequency of Social Gatherings with Friends and Family: More than three times a week    Attends Religious Services: More than 4 times per year    Active Member of [employer] or Organizations: No    Attends Music therapist: Never    Marital Status: Divorced     Family History: The patient's family history includes Arthritis in an other family member; Coronary artery disease in an other family member; Healthy in  his daughter; Heart disease in his brother, father, and mother; Hyperlipidemia in his brother, father, and another family member; Hypertension in an other family member; Stroke in his father. There is no history of Dementia. ROS:   Please see the history of present illness.    All 14 point review of systems negative except as described per history of present illness  EKGs/Labs/Other Studies Reviewed:      Recent Labs: 10/22/2021: B Natriuretic Peptide 253.8 03/31/2022: ALT 16; BUN 13; Creatinine, Ser 1.18; Hemoglobin 15.3; NT-Pro BNP 487; Platelets 199; Potassium 4.5; Sodium 142; TSH 1.910  Recent Lipid Panel  Component Value Date/Time   CHOL 163 03/31/2022 1708   TRIG 214 (H) 03/31/2022 1708   TRIG 107 05/09/2010 0000   HDL 36 (L) 03/31/2022 1708   CHOLHDL 4.5 03/31/2022 1708   CHOLHDL 3.6 05/20/2015 1410   VLDL 27 05/20/2015 1410   LDLCALC 91 03/31/2022 1708    Physical Exam:    VS:  BP 136/74 (BP Location: Left Arm, Patient Position: Sitting)   Pulse 89   Ht '5\' 6"'$  (1.676 m)   Wt 201 lb 3.2 oz (91.3 kg)   SpO2 93%   BMI 32.47 kg/m     Wt Readings from Last 3 Encounters:  07/01/22 201 lb 3.2 oz (91.3 kg)  05/27/22 203 lb 6 oz (92.3 kg)  04/29/22 207 lb 8 oz (94.1 kg)     GEN:  Well nourished, well developed in no acute distress HEENT: Normal NECK: No JVD; No carotid bruits LYMPHATICS: No lymphadenopathy CARDIAC: RRR, no murmurs, no rubs, no gallops RESPIRATORY:  Clear to auscultation without rales, wheezing or rhonchi  ABDOMEN: Soft, non-tender, non-distended MUSCULOSKELETAL:  No edema; No deformity  SKIN: Warm and dry LOWER EXTREMITIES: no swelling NEUROLOGIC:  Alert and oriented x 3 PSYCHIATRIC:  Normal affect   ASSESSMENT:    1. Dyspnea on exertion   2. Chronic systolic (congestive) heart failure ([place])   3. Ischemic cardiomyopathy   4. Essential hypertension   5. Parkinson's disease, unspecified whether dyskinesia present, unspecified whether  manifestations fluctuate   6. S/P CABG (coronary artery bypass graft)    PLAN:    In order of problems listed above:  Dyspnea on exertion looks compensated on the physical exam.  I will check his Chem-7 as well as proBNP today.  The biggest issue is the fact that I do not know what medication he is taking.  Therefore, I will call him on Monday and ask him to tell us list of medication he is taking right now. Ischemic cardiomyopathy difficulty tolerating medication with some noncompliance issues which we will try to resolve.  He is upset because nobody called him right away after echocardiogram there is diminishing ejection fraction.  But at the same time he is noncompliant and not taking medications reassured. Essential hypertension blood pressure well-controlled continue present management. Dyslipidemia I did review K PN I have data from August 2023 with LDL of 91 HDL 36.  Overall I am more about him.  He lives independently with advanced Parkinson.  He missed some medication he does have left bundle branch block on EKG he probably can benefit from BiV pacing however he does not want to have that done.  He is EKG has been read as atrial flutter, however careful analysis showed like this is probably sinus rhythm with tremor of Parkinson noted   Medication Adjustments/Labs and Tests Ordered: Current medicines are reviewed at length with the patient today.  Concerns regarding medicines are outlined above.  Orders Placed This Encounter  Procedures   Pro b natriuretic peptide (BNP)   Basic metabolic panel   Medication changes: No orders of the defined types were placed in this encounter.   Signed, Park Liter, MD, [facility] 07/01/2022 4:48 PM    [place]

## 2022-07-02 LAB — BASIC METABOLIC PANEL
BUN/Creatinine Ratio: 14 (ref 10–24)
BUN: 14 mg/dL (ref 8–27)
CO2: 20 mmol/L (ref 20–29)
Calcium: 9.3 mg/dL (ref 8.6–10.2)
Chloride: 105 mmol/L (ref 96–106)
Creatinine, Ser: 0.98 mg/dL (ref 0.76–1.27)
Glucose: 97 mg/dL (ref 70–99)
Potassium: 4.6 mmol/L (ref 3.5–5.2)
Sodium: 141 mmol/L (ref 134–144)
eGFR: 79 mL/min/{1.73_m2} (ref >59)

## 2022-07-02 LAB — PRO B NATRIURETIC PEPTIDE: NT-Pro BNP: 293 pg/mL (ref 0–486)

## 2022-07-04 ENCOUNTER — Ambulatory Visit: Payer: Medicare Other | Admitting: Cardiology

## 2022-07-04 NOTE — Addendum Note (Signed)
Addended by: Jerl Santos R on: 07/04/2022 12:10 PM   Modules accepted: Orders

## 2022-07-05 ENCOUNTER — Ambulatory Visit: Payer: Self-pay

## 2022-07-05 ENCOUNTER — Telehealth: Payer: Self-pay | Admitting: Cardiology

## 2022-07-05 DIAGNOSIS — I110 Hypertensive heart disease with heart failure: Secondary | ICD-10-CM

## 2022-07-05 NOTE — Telephone Encounter (Signed)
Spoke with pt. He is taking: Metoprolol '25mg'$  q d Entresto 24/26 1 BID Rytary '75mg'$  Furosemide '40mg'$  1/2 tablet weekly.

## 2022-07-05 NOTE — Telephone Encounter (Signed)
Patient called stating he saw Dr. Drue Dun on Friday, and was told that a nurse would call him to go over his medication.

## 2022-07-05 NOTE — Patient Instructions (Signed)
Visit Information  Thank you for taking time to visit with me today. Please don't hesitate to contact me if I can be of assistance to you.   Following are the goals we discussed today:   Goals Addressed             This Visit's Progress    Care Coordination: Community resource needs       Care Coordination Interventions: Care Guide referral for re-referral for community food resources Discussed DME companies: [place] [phone]; [place] [phone] and [place] [phone]. RNCM will contact to follow up  Collaboration with clinical pharmacist regarding medication reconciliation/medication adherence.      Our next appointment is by telephone on 07/15/22 at 11:30 am  Please call the care guide team at [phone] if you need to cancel or reschedule your appointment.   If you are experiencing a Mental Health or [place] or need someone to talk to, please call the Suicide and Crisis Lifeline: 988  Patient verbalizes understanding of instructions and care plan provided today and agrees to view in [place]. Active MyChart status and patient understanding of how to access instructions and care plan via MyChart confirmed with patient.     Thea Silversmith, RN, MSN, BSN, [place] Coordinator [phone]

## 2022-07-05 NOTE — Patient Outreach (Signed)
  Care Coordination   Follow Up Visit Note   07/05/2022 Name: DEMARQUS JOCSON MRN: [id number] DOB: 12-Jun-1946  ANDRA MATSUO is a 76 y.o. year old male who sees [name], Crosby Oyster, DO for primary care. I spoke with  [name] by phone today.  What matters to the patients health and wellness today?  Mr. Trefry expresses he is waiting on nurse from cardiologist office to call him to discuss his medications as well as other questions post office visit with cardiology on 07/01/22. He reports he called the DME company's previously provided, but has not moved forward in the process. He states he is still interested in obtaining a lift chair. He states he is also still interested in food resources. He reports the care guide probably caught him on a day when he was receiving a lot a spam call. He would like food resources. He is also agreeable to speaking with clinical pharmacist regarding medication reconciliation/medication adherence.  Goals Addressed             This Visit's Progress    Care Coordination: Community resource needs       Care Coordination Interventions: Care Guide referral for re-referral for community food resources Discussed DME companies: [place] [phone]; [place] [phone] and [place] [phone]. RNCM will contact to follow up  Collaboration with clinical pharmacist regarding medication reconciliation/medication adherence.      SDOH assessments and interventions completed:  Yes  Care Coordination Interventions:  Yes, provided   Follow up plan: Follow up call scheduled for 07/15/22    Encounter Outcome:  Pt. Visit Completed   Thea Silversmith, RN, MSN, BSN, [place] Coordinator [phone]

## 2022-07-06 NOTE — Telephone Encounter (Signed)
Spoke with pt about medications. Dr. Agustin Cree reviewed medications and advised to stay on current medications dose. Metoprolol 25 q d, Entresto 24/26 q d, Lasix '40mg'$  1/2 tab 1 x weekly. Pt agreed and verbalized understanding.

## 2022-07-07 ENCOUNTER — Ambulatory Visit: Payer: Medicare Other | Admitting: Pharmacist

## 2022-07-07 ENCOUNTER — Other Ambulatory Visit: Payer: Self-pay | Admitting: Family Medicine

## 2022-07-07 DIAGNOSIS — I2510 Atherosclerotic heart disease of native coronary artery without angina pectoris: Secondary | ICD-10-CM

## 2022-07-07 DIAGNOSIS — E7800 Pure hypercholesterolemia, unspecified: Secondary | ICD-10-CM

## 2022-07-07 DIAGNOSIS — Z79899 Other long term (current) drug therapy: Secondary | ICD-10-CM

## 2022-07-07 DIAGNOSIS — I5022 Chronic systolic (congestive) heart failure: Secondary | ICD-10-CM

## 2022-07-07 MED ORDER — GABAPENTIN 100 MG PO CAPS: 100.0000 mg | ORAL_CAPSULE | Freq: Every day | ORAL | 1 refills | Status: DC

## 2022-07-07 MED ORDER — NITROGLYCERIN 0.4 MG SL SUBL: 0.4000 mg | SUBLINGUAL_TABLET | SUBLINGUAL | 1 refills | Status: DC | PRN

## 2022-07-07 MED ORDER — FLOVENT HFA 110 MCG/ACT IN AERO: 1.0000 | INHALATION_SPRAY | Freq: Every day | RESPIRATORY_TRACT | 3 refills | Status: DC | PRN

## 2022-07-07 NOTE — Patient Instructions (Signed)
Shawn Meza It was a pleasure speaking with you today.  Below is a summary of your health goals and summary of our recent visit.  Heart Health Continue furosemide '40mg'$  - take 0.5 tablet = '20mg'$  as needed for swelling / fluid retention / increase in weight. Start checking weight daily. Report weight gain of more than 3 lbs in 24 hours or 5 lbs in 1 week to either cardiology office or our office.  Signs of worsening heart failure  - weight gain, feeling more short of breath, abdominal fullness, swelling in legs or abdomen, Fatigue and weakness, changes in ability to perform usual activities, persistent cough or wheezing with white or pink blood-tinged mucus, nausea and lack of appetite Recommended 2 liters or less of fluids per day.   Coordinated with [facility] to fill needed medications- make sure to pick up and take as directed.   Mailed updated medication list and list of upcoming appointments   Follow Up:  Face to Face appointment with Clinical Pharmacist Practitioner scheduled for: 1 month  As always if you have any questions or concerns especially regarding medications, please feel free to contact me either at the phone number below or with a MyChart message.   [name], PharmD Clinical Pharmacist [facility] [phone] (direct line)  [phone] (main office number)

## 2022-07-07 NOTE — Progress Notes (Signed)
Pharmacy Note  07/07/2022 Name: Shawn Meza MRN: [id number] DOB: Feb 25, 1946  Subjective: Shawn Meza is a 76 y.o. year old male who is a primary care patient of [name], DO. Clinical Pharmacist Practitioner referral was placed to assist with medication management.    Engaged with patient by telephone for follow up visit today.  Patient had been lost to follow up with Clinical Pharmacist Practitioner due to missed appointments  phone calls.  We received message from [place] that patient was having difficulty managing his medications. Dr Agustin Cree his cardiologist also was concerned about patient's adherence to medication therapy.   Patient had been prescribed spironolactone + torsemide in place of furosemide after an ECHO 05/2022 however patient only too 1 or 2 doses and then stopped because he felt he was urinating too much and felt he was getting dehydrated. He restart his previous diuretic furosemide '40mg'$  - takes 0.5 tablet = '20mg'$  1 or 2 times per week. Patient was seen by Dr Agustin Cree 07/01/2022 and BPN checked was WNL. Dr Agustin Cree recommended pt continue current regimen - furosemide '40mg'$  0.5 tablet weekly. He also recommended patient weigh daily but patient has not started doing this yet.   Patient is past due to refill several medications. He reports he stopped taking pravastatin - "I don't know why I stopped, I was not having any side effects. I just stopped it"   Depression - patient is see a Social worker. He feels this has been helpful. He also has bupropion on his med list but he reports he has not taken in several week. He said they tried bupropion ER '150mg'$  daily but he did not tolerate the dose. Dr [name] changed back to '75mg'$  twice a day but per patient he never received from his pharmacy.   Objective: Review of patient status, including review of consultants reports, laboratory and other test data, was performed as part of  comprehensive.  Lab Results  Component Value Date   CREATININE 0.98 07/01/2022   CREATININE 1.18 03/31/2022   CREATININE 0.95 10/22/2021    Lab Results  Component Value Date   HGBA1C 5.7 (H) 12/19/2018       Component Value Date/Time   CHOL 163 03/31/2022 1708   TRIG 214 (H) 03/31/2022 1708   TRIG 107 05/09/2010 0000   HDL 36 (L) 03/31/2022 1708   CHOLHDL 4.5 03/31/2022 1708   CHOLHDL 3.6 05/20/2015 1410   VLDL 27 05/20/2015 1410   LDLCALC 91 03/31/2022 1708     Clinical ASCVD: Yes  The ASCVD Risk score (Arnett DK, et al., 2019) failed to calculate for the following reasons:   The patient has a prior MI or stroke diagnosis    BP Readings from Last 3 Encounters:  07/01/22 136/74  05/27/22 136/72  04/29/22 112/68     Allergies  Allergen Reactions   Tizanidine Hcl Hives   Fluoxetine Other (See Comments)    Caused depression and aggression   Rosuvastatin Other (See Comments)    Whole body aches   Testosterone Other (See Comments)    ABDOMINAL PAIN and cramping   Requip [Ropinirole] Nausea Only    Medications Reviewed Today     Reviewed by [name], RPH-CPP (Pharmacist) on 07/07/22 at 1145  Med List Status: <None>   Medication Order Taking? Sig Documenting Provider Last Dose Status Informant  acetaminophen (TYLENOL) 325 MG tablet [id number] No Take 162.5 mg by mouth every 6 (six) hours as needed for mild pain or  moderate pain.  Patient not taking: Reported on 07/07/2022   [provider] Not Taking Active   aspirin 81 MG EC tablet [id number] Yes Take 1 tablet (81 mg total) by mouth daily. [name], DO Taking Active   buPROPion [facility]) 75 MG tablet [id number] No Take 1 tablet (75 mg total) by mouth 2 (two) times daily.  Patient not taking: Reported on 07/07/2022   [name], DO Not Taking Active   Carbidopa-Levodopa ER [facility]) 48.75-195 MG CPCR [id number] Yes Take 1 capsule by mouth 3 (three) times daily. 7am/11am/4pm   Patient taking differently: Take 1 capsule by mouth in the morning and at bedtime. 7am/11am/4pm   [name], Eustace Quail, DO Taking Active   esomeprazole (NEXIUM) 40 MG capsule [id number] Yes Take 1 capsule (40 mg total) by mouth at bedtime. [name], DO Taking Active   FLOVENT HFA 110 MCG/ACT inhaler [id number] Yes Inhale 1 puff into the lungs daily as needed (wheezing, shortness of breath). [name], DO Taking Active   fluticasone (FLONASE) 50 MCG/ACT nasal spray [id number] Yes Place 2 sprays into both nostrils daily. [name], DO Taking Active   furosemide (LASIX) 40 MG tablet [id number] Yes Take 20 mg by mouth as needed for fluid or edema (shortness of breath). [provider] Taking Active            Med Note Antony Contras, Lynelle Smoke B   Thu Jul 07, 2022 11:19 AM) Patient reports he is taking 1 or 2 times per week.  gabapentin (NEURONTIN) 100 MG capsule [id number] No Take 1 capsule (100 mg total) by mouth at bedtime.  Patient not taking: Reported on 07/07/2022   [name], DO Not Taking Active   levocetirizine (XYZAL) 5 MG tablet [id number] Yes Take 1 tablet (5 mg total) by mouth every evening. [name], DO Taking Active   metoprolol tartrate (LOPRESSOR) 25 MG tablet [id number] Yes Take 1 tablet (25 mg total) by mouth daily. Richardo Priest, MD Taking Active   nitroGLYCERIN (NITROSTAT) 0.4 MG SL tablet [id number] Yes Place 1 tablet (0.4 mg total) under the tongue every 5 (five) minutes x 3 doses as needed for chest pain. [name], DO Taking Active   pravastatin (PRAVACHOL) 20 MG tablet [id number] No Take 1 tablet (20 mg total) by mouth daily.  Patient not taking: Reported on 07/07/2022   [name], DO Not Taking Active   prednisoLONE acetate (PRED FORTE) 1 % ophthalmic suspension [id number] No Place 1 drop into both eyes 2 (two) times daily.  Patient not taking: Reported on 07/07/2022   [provider]  Not Taking Active Self  sacubitril-valsartan (ENTRESTO) 24-26 MG [id number] Yes Take 1 tablet by mouth daily. Richardo Priest, MD Taking Active   senna-docusate (SENNA PLUS) 8.6-50 MG tablet [id number] Yes Take 1 tablet by mouth daily as needed for mild constipation. [provider] Taking Active             Patient Active Problem List   Diagnosis Date Noted   Major depressive disorder, recurrent episode, moderate ([place]) 06/30/2022   Lumbar spondylosis 06/29/2022   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   Diarrhea 11/04/2020   Rectal bleeding 11/04/2020   Trochanteric bursitis of right hip    Transient ischemic attack    NSTEMI (non-ST elevated myocardial infarction) ([place])    Nephrolithiasis    Metabolic syndrome    Major depression, chronic    Long-term use of aspirin therapy  Hypercholesterolemia    Hiatal hernia    GERD (gastroesophageal reflux disease)    Essential tremor    Essential hypertension    Erectile dysfunction    Elevated PSA    Colon cancer (HCC)    Chronic coronary artery disease    Arthritis    Myofascial pain 12/20/2019   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   Low back pain 10/28/2019   Radiculopathy, lumbar region 10/28/2019   Fatigue 19/62/2297   Chronic systolic (congestive) heart failure ([place]) 09/18/2018   Ischemic cardiomyopathy 09/16/2018   Aortic regurgitation 09/16/2018   Cardiomyopathy, unspecified ([place]) 06/13/2018   Abscess of right axilla 12/12/2017   BMI 33.0-33.9,adult 12/12/2017   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   Acute blood loss anemia 10/25/2017   Acute postoperative respiratory insufficiency 10/25/2017   Postoperative delirium 10/25/2017   Dyslipidemia 10/17/2017   SOB (shortness of breath) 03/31/2017   Long term current use of aspirin 03/29/2017   Parkinson's disease 01/02/2017   Memory change 07/06/2015   Depression, recurrent ([place]) 07/06/2015   Medial meniscus tear 10/11/2011   Neuroma of foot  10/11/2011   Coronary artery disease involving native coronary artery of native heart with angina pectoris ([place]) 12/28/2010   OTHER TESTICULAR HYPOFUNCTION 05/20/2010   Mixed hyperlipidemia 05/20/2010   Hypertensive heart disease with heart failure ([place]) 05/20/2010   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 05/20/2010   GERD 05/20/2010   TRANSIENT ISCHEMIC ATTACK, HX OF 05/20/2010   NEPHROLITHIASIS, HX OF 05/20/2010   BENIGN PROSTATIC HYPERTROPHY, HX OF, S/P TURP 05/20/2010     Medication Assistance:  None required.  Patient affirms current coverage meets needs.   Assessment / Plan: CHF: Controlled per last BNP and patient symptoms Answered patient's questions about recent ECHO and BNP Updated medication list. Patient will continue furosemide '40mg'$  - take 0.5 tablet = '20mg'$  as needed.  Recommended patient start checking weight daily. Report weight gain of more than 3 lbs in 24 hours or 5 lbs in 1 week. Discussed signs and symptoms of CHF exacerbation - weight gain, SOB, abdominal fullness, swelling in legs or abdomen, Fatigue and weakness, changes in ability to perform usual activities, persistent cough or wheezing with white or pink blood-tinged mucus, nausea and lack of appetite Recommended 2 liters of less of fluids per day.   Medication management:  Reviewed and updated medication list Reviewed refill history and adherence. Coordinated with [facility] to fill maintenance and other needed medications.  Will send Dr [name] request for gabapentin refill.   Meds ordered this encounter  Medications   nitroGLYCERIN (NITROSTAT) 0.4 MG SL tablet    Sig: Place 1 tablet (0.4 mg total) under the tongue every 5 (five) minutes x 3 doses as needed for chest pain. If chest pain is not relieved after 2nd dose - call 911.    Dispense:  25 tablet    Refill:  1   FLOVENT HFA 110 MCG/ACT inhaler    Sig: Inhale 1 puff into the lungs daily as needed (wheezing, shortness of breath).    Dispense:   1 each    Refill:  3    Mailed updated medication list and list of upcoming appointments because patient states he has difficulty seeing appointments in [place].   Follow Up:  Face to Face appointment with Clinical Pharmacist Practitioner scheduled for: 1 month   [name], PharmD Clinical Pharmacist [place] [phone]

## 2022-07-08 ENCOUNTER — Ambulatory Visit (INDEPENDENT_AMBULATORY_CARE_PROVIDER_SITE_OTHER): Payer: Medicare Other | Admitting: Psychology

## 2022-07-08 DIAGNOSIS — F331 Major depressive disorder, recurrent, moderate: Secondary | ICD-10-CM | POA: Diagnosis not present
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
## 2022-07-13 ENCOUNTER — Telehealth: Payer: Self-pay | Admitting: *Deleted

## 2022-07-13 NOTE — Telephone Encounter (Signed)
   Telephone encounter was:  Successful.  07/13/2022 Name: Shawn Meza MRN: [id number] DOB: Sep 27, 1945  Shawn Meza is a 76 y.o. year old male who is a primary care patient of [name], DO . The community resource team was consulted for assistance with [place] guide performed the following interventions: Patient provided with information about care guide support team and interviewed to confirm resource needs.MOW wait list and provided Moms meals until MOW kicks in for him.   Follow Up Plan:  No further follow up planned at this time. The patient has been provided with needed resources.  [facility] [phone] [address] , [place] [address] Email : [email or web address]

## 2022-07-14 ENCOUNTER — Telehealth: Payer: Self-pay | Admitting: *Deleted

## 2022-07-14 NOTE — Telephone Encounter (Signed)
   Telephone encounter was:  Unsuccessful.  07/14/2022 Name: Shawn Meza MRN: [id number] DOB: Feb 06, 1946  Unsuccessful outbound call made today to assist with:  Food Insecurity  Outreach Attempt:  Patient asked I leave a message when mom meals started  A HIPAA compliant voice message was left requesting a return call.  Instructed patient to call back at [phone]. [place] [phone] [address] , [place] [address] Email : [email or web address]

## 2022-07-15 ENCOUNTER — Telehealth: Payer: Self-pay

## 2022-07-15 ENCOUNTER — Ambulatory Visit (INDEPENDENT_AMBULATORY_CARE_PROVIDER_SITE_OTHER): Payer: Medicare Other | Admitting: Psychology

## 2022-07-15 ENCOUNTER — Telehealth: Payer: Self-pay | Admitting: Family Medicine

## 2022-07-15 DIAGNOSIS — G20A1 Parkinson's disease without dyskinesia, without mention of fluctuations: Secondary | ICD-10-CM

## 2022-07-15 DIAGNOSIS — G250 Essential tremor: Secondary | ICD-10-CM

## 2022-07-15 DIAGNOSIS — M47818 Spondylosis without myelopathy or radiculopathy, sacral and sacrococcygeal region: Secondary | ICD-10-CM

## 2022-07-15 DIAGNOSIS — F331 Major depressive disorder, recurrent, moderate: Secondary | ICD-10-CM

## 2022-07-15 DIAGNOSIS — M7061 Trochanteric bursitis, right hip: Secondary | ICD-10-CM

## 2022-07-15 DIAGNOSIS — Z951 Presence of aortocoronary bypass graft: Secondary | ICD-10-CM

## 2022-07-15 DIAGNOSIS — M461 Sacroiliitis, not elsewhere classified: Secondary | ICD-10-CM

## 2022-07-15 NOTE — Patient Outreach (Signed)
  Care Coordination   07/15/2022 Name: LISTER BRIZZI MRN: [id number] DOB: 03/30/1946   Care Coordination Outreach Attempts:  An unsuccessful telephone outreach was attempted for a scheduled appointment today.  Follow Up Plan:  Additional outreach attempts will be made to offer the patient care coordination information and services.   Encounter Outcome:  No Answer   Care Coordination Interventions:  No, not indicated    Thea Silversmith, RN, MSN, BSN, [facility] Coordinator [phone]
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
## 2022-07-15 NOTE — Telephone Encounter (Signed)
PCP completed form Printed order for Lift Chair. Called the patient to inform has been completed and will mail to him Made a copy of form for scan

## 2022-07-15 NOTE — Telephone Encounter (Signed)
Patient states that his insurance approved a lift chair. Patient states that he had found a place to get lift chair.  Patient was told by store that he needs prescription from physician. Looks like Medicare requires prescription and certificate of Medical necessity. Printed need certificate of med Necessity and will forward to Dr Irene Limbo CMA.   Once completed patient would like mailed to him if possible.

## 2022-07-15 NOTE — Telephone Encounter (Signed)
Patient would like a call back to discuss medications.

## 2022-07-18 ENCOUNTER — Telehealth: Payer: Self-pay | Admitting: *Deleted

## 2022-07-18 NOTE — Telephone Encounter (Signed)
Amyka with [employer] called to follow up to see if prior authorization has been completed. Advised of CMA's note that it was completed and mailed to patient. Amyka understands

## 2022-08-03 ENCOUNTER — Telehealth: Payer: Self-pay

## 2022-08-03 ENCOUNTER — Ambulatory Visit (INDEPENDENT_AMBULATORY_CARE_PROVIDER_SITE_OTHER): Payer: Medicare Other | Admitting: Psychology

## 2022-08-03 DIAGNOSIS — F331 Major depressive disorder, recurrent, moderate: Secondary | ICD-10-CM | POA: Diagnosis not present

## 2022-08-03 NOTE — Patient Outreach (Addendum)
  Care Coordination   Follow Up Visit Note   08/03/2022 Name: Shawn Meza MRN: [id number] DOB: Dec 04, 1945  Shawn Meza is a 77 y.o. year old male who sees [name], Crosby Oyster, DO for primary care. I spoke with  [name] by phone today.  What matters to the patients health and wellness today?  Shawn Meza called re: lift chair. He reports he has found a lift chair from a private source. However they do not file with insurance. Shawn Meza states provider has completed paperwork, but he is unclear who to send the paperwork. He states he has left the paperwork at provider office with Shirlean Mylar, and is trying to find out where to send the paperwork. He reports he called his insurance company to find out, but did not get an answer.   Goals Addressed             This Visit's Progress    Care Coordination: Community resource needs       Care Coordination Interventions: Collaborated with care guide regarding chair lift. Per Penni Bombard She assisted patient with calling to his insurance provider, but has no knowledge regarding paperwork Message to provider CMA, Robin Ewing, CMA to follow up on paperwork       SDOH assessments and interventions completed:  No  Care Coordination Interventions:  Yes, provided   Follow up plan:  RNCM will continue to follow    Encounter Outcome:  Pt. Visit Completed   Thea Silversmith, RN, MSN, BSN, CCM [facility] Care Coordinator [phone]   Addendum: Received message from Shirlean Mylar that clinical pharmacist to assist patient with paperwork at appointment scheduled for tomorrow. RNCM contacted patient to notify. Patient verbalized understanding.
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
## 2022-08-03 NOTE — Patient Outreach (Signed)
  Care Coordination   08/03/2022 Name: SARON TWEED MRN: [id number] DOB: 23-Oct-1945   Care Coordination Outreach Attempts:  An unsuccessful telephone outreach was attempted today to offer the patient information about available care coordination services as a benefit of their health plan. RNCM recived voice message from patient requesting a call back. RNCM attempted outreach. No answer. HIPAA compliant voice message left.  Follow Up Plan: Await return call. Continue to follow. If no return call RNCM will follow up at next scheduled call 08/11/22.  Encounter Outcome:  No Answer   Care Coordination Interventions:  No, not indicated    Thea Silversmith, RN, MSN, BSN, [place] Coordinator [phone]

## 2022-08-04 ENCOUNTER — Ambulatory Visit (INDEPENDENT_AMBULATORY_CARE_PROVIDER_SITE_OTHER): Payer: Medicare Other | Admitting: Pharmacist

## 2022-08-04 VITALS — BP 122/72 | HR 74 | Wt 210.4 lb

## 2022-08-04 DIAGNOSIS — I5022 Chronic systolic (congestive) heart failure: Secondary | ICD-10-CM

## 2022-08-04 DIAGNOSIS — G20A1 Parkinson's disease without dyskinesia, without mention of fluctuations: Secondary | ICD-10-CM

## 2022-08-04 DIAGNOSIS — F339 Major depressive disorder, recurrent, unspecified: Secondary | ICD-10-CM

## 2022-08-04 DIAGNOSIS — Z79899 Other long term (current) drug therapy: Secondary | ICD-10-CM

## 2022-08-04 NOTE — Progress Notes (Signed)
Pharmacy Note  08/04/2022 Name: Shawn Meza MRN: [id number] DOB: August 03, 1945  Subjective: Shawn Meza is a 77 y.o. year old male who is a primary care patient of [name], DO. Clinical Pharmacist Practitioner referral was placed to assist with medication management.    Engaged with patient face to face for follow up visit today.  Medication Management:  Patient has been noted to take medications inconsistently. He often adjusts therapy himself due to suspected side effects. In past he has noted that several medications seem to cause drowsiness and fatigue.   As noted by his cardiologist recently this makes adjusting therapy difficult.    Today he brings in his medications.    Depression - patient is seeing a Social worker. He feels this has been helpful. He also has bupropion '75mg'$  twice a day but he reports he is only taking 0.5 tablet daily.   Patient also dropped off CMS paperwork for Lift chair last week to our office along with handwritten information from a furniture company about the model and cost of a lift chair. He would like assistance with getting lift chair and determining if insurance will cover part or all of the cost.    Objective: Review of patient status, including review of consultants reports, laboratory and other test data, was performed as part of comprehensive.  Lab Results  Component Value Date   CREATININE 0.98 07/01/2022   CREATININE 1.18 03/31/2022   CREATININE 0.95 10/22/2021    Lab Results  Component Value Date   HGBA1C 5.7 (H) 12/19/2018       Component Value Date/Time   CHOL 163 03/31/2022 1708   TRIG 214 (H) 03/31/2022 1708   TRIG 107 05/09/2010 0000   HDL 36 (L) 03/31/2022 1708   CHOLHDL 4.5 03/31/2022 1708   CHOLHDL 3.6 05/20/2015 1410   VLDL 27 05/20/2015 1410   LDLCALC 91 03/31/2022 1708     Clinical ASCVD: Yes  The ASCVD Risk score (Arnett DK, et al., 2019) failed to calculate for the following reasons:   The  patient has a prior MI or stroke diagnosis    BP Readings from Last 3 Encounters:  08/04/22 122/72  07/01/22 136/74  05/27/22 136/72     Allergies  Allergen Reactions   Tizanidine Hcl Hives   Fluoxetine Other (See Comments)    Caused depression and aggression   Rosuvastatin Other (See Comments)    Whole body aches   Testosterone Other (See Comments)    ABDOMINAL PAIN and cramping   Requip [Ropinirole] Nausea Only    Medications Reviewed Today     Reviewed by [name], RPH-CPP (Pharmacist) on 08/04/22 at 59  Med List Status: <None>   Medication Order Taking? Sig Documenting Provider Last Dose Status Informant  acetaminophen (TYLENOL) 325 MG tablet [id number] Yes Take 162.5 mg by mouth every 6 (six) hours as needed for mild pain or moderate pain. [provider] Taking Active   aspirin 81 MG EC tablet [id number] Yes Take 1 tablet (81 mg total) by mouth daily. [name], DO Taking Active   buPROPion [facility]) 75 MG tablet [id number] Yes Take 1 tablet (75 mg total) by mouth 2 (two) times daily.  Patient taking differently: Take 37.5 mg by mouth daily.   [name], DO Taking Active   Carbidopa-Levodopa ER [place]) 60.10-932 MG CPCR [id number] Yes Take 1 capsule by mouth 3 (three) times daily. 7am/11am/4pm  Patient taking differently: Take 1 capsule by mouth in the morning  and at bedtime. 7am/11am/4pm   [name], Eustace Quail, DO Taking Active   esomeprazole (NEXIUM) 40 MG capsule [id number] Yes Take 1 capsule (40 mg total) by mouth at bedtime.  Patient taking differently: Take 40 mg by mouth at bedtime as needed.   [name], DO Taking Active   FLOVENT HFA 110 MCG/ACT inhaler [id number] Yes INHALE 1 PUFF BY MOUTH INTO LUNGS DAILY AS NEEDED FOR WHEEZING, OR FOR SHORTNESS OF BREATH. [name], DO Taking Active   fluticasone (FLONASE) 50 MCG/ACT nasal spray [id number] Yes Place 2 sprays into both nostrils daily. [name], DO Taking Active   furosemide (LASIX) 40 MG tablet [id number] Yes Take 20 mg by mouth as needed for fluid or edema (shortness of breath). [provider] Taking Active            Med Note Antony Contras, Lynelle Smoke B   Thu Jul 07, 2022 11:19 AM) Patient reports he is taking 1 or 2 times per week.  gabapentin (NEURONTIN) 100 MG capsule [id number] No Take 1 capsule (100 mg total) by mouth at bedtime.  Patient not taking: Reported on 08/04/2022   [name], DO Not Taking Active   levocetirizine (XYZAL) 5 MG tablet [id number] Yes Take 1 tablet (5 mg total) by mouth every evening. [name], DO Taking Active   metoprolol tartrate (LOPRESSOR) 25 MG tablet [id number] Yes Take 1 tablet (25 mg total) by mouth daily. Richardo Priest, MD Taking Active   nitroGLYCERIN (NITROSTAT) 0.4 MG SL tablet [id number]  Place 1 tablet (0.4 mg total) under the tongue every 5 (five) minutes x 3 doses as needed for chest pain. If chest pain is not relieved after 2nd dose - call 911. [name], DO  Active   pravastatin (PRAVACHOL) 20 MG tablet [id number] Yes Take 1 tablet (20 mg total) by mouth daily. [name], DO Taking Active   prednisoLONE acetate (PRED FORTE) 1 % ophthalmic suspension [id number] No Place 1 drop into both eyes 2 (two) times daily.  Patient not taking: Reported on 07/07/2022   [provider] Not Taking Active Self  sacubitril-valsartan (ENTRESTO) 24-26 MG [id number] Yes Take 1 tablet by mouth daily. Richardo Priest, MD Taking Active   senna-docusate (SENNA PLUS) 8.6-50 MG tablet [id number]  Take 1 tablet by mouth daily as needed for mild constipation. [provider]  Active             Patient Active Problem List   Diagnosis Date Noted   Major depressive disorder, recurrent episode, moderate ([place]) 06/30/2022   Lumbar spondylosis 06/29/2022   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   Diarrhea 11/04/2020   Rectal bleeding 11/04/2020    Trochanteric bursitis of right hip    Transient ischemic attack    NSTEMI (non-ST elevated myocardial infarction) ([place])    Nephrolithiasis    Metabolic syndrome    Major depression, chronic    Long-term use of aspirin therapy    Hypercholesterolemia    Hiatal hernia    GERD (gastroesophageal reflux disease)    Essential tremor    Essential hypertension    Erectile dysfunction    Elevated PSA    Colon cancer ([place])    Chronic coronary artery disease    Arthritis    Myofascial pain 12/20/2019   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   Low back pain 10/28/2019   Radiculopathy, lumbar region 10/28/2019   Fatigue 62/69/4854   Chronic systolic (congestive) heart failure ([place]) 09/18/2018  Ischemic cardiomyopathy 09/16/2018   Aortic regurgitation 09/16/2018   Cardiomyopathy, unspecified ([place]) 06/13/2018   Abscess of right axilla 12/12/2017   BMI 33.0-33.9,adult 12/12/2017   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   Acute blood loss anemia 10/25/2017   Acute postoperative respiratory insufficiency 10/25/2017   Postoperative delirium 10/25/2017   Dyslipidemia 10/17/2017   SOB (shortness of breath) 03/31/2017   Long term current use of aspirin 03/29/2017   Parkinson's disease 01/02/2017   Memory change 07/06/2015   Depression, recurrent ([place]) 07/06/2015   Medial meniscus tear 10/11/2011   Neuroma of foot 10/11/2011   Coronary artery disease involving native coronary artery of native heart with angina pectoris ([place]) 12/28/2010   OTHER TESTICULAR HYPOFUNCTION 05/20/2010   Mixed hyperlipidemia 05/20/2010   Hypertensive heart disease with heart failure ([place]) 05/20/2010   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 05/20/2010   GERD 05/20/2010   TRANSIENT ISCHEMIC ATTACK, HX OF 05/20/2010   NEPHROLITHIASIS, HX OF 05/20/2010   BENIGN PROSTATIC HYPERTROPHY, HX OF, S/P TURP 05/20/2010     Medication Assistance:  None required.  Patient affirms current coverage meets  needs. (Has [facility] Medicare and Medicaid coverage   Assessment / Plan: CHF / hypertension:  Controlled per last BNP and patient symptoms Continue current medication therapy - metoprolol '25mg'$  once a day, Entresto once a day and furosemide once weekly as needed.   Parkinson's: Continue [place] [facility] per Dr Doristine Devoid directions Assisted patient in calling medical supply company ([place] [facility] [employer] in [place]) to inquire about coverage of recliner type lift chair and Medicare / Medicaid coverage. Both companies told us that Medicare only covers the lift mechanism in the chair which is about $200 of the total cost. The chair portion is not reimbursable by Medicare or Medicaid so patient would be responsible for the remaining cost. Patient states he believes that [facility] will cover cost at 100% and he would like to get from his friend's furniture store. I assisted patient in [place] to check cost of lift chair. We were cut off from the first person we spoke with who seemed to think that the chair would be covered but the call dropped before they could confirm. The second representative we spoke with from [facility] told us the same that the medical supply stores did - that only the lift mechanism is covered.  Instructed patient the he should think about what he would like to do. He has prescription from Dr [name] and paperwork to file for reimbursement from [place] / medicare. He will need to purchase from a supplier that is contracted to do so with Medicare as the paperwork requires the supplier's Medicare number and will also need a certified receipt / bill of sale for the chair. If patient needs help submitting claim he should get supplier to assist. Our office has supplied our info on claim (diagnosis, reasoning behind need for chair) and Rx for the chair.   Medication management:  Reviewed and updated medication list Reviewed refill history and  adherence.  Assisted patient in filling weekly medication continue to help with adherence. We only use bupropion '75mg'$  0.5 tablet each morning to start because that is how patient has been taking but will reassess next week and increase to 1 tablet daily if tolerating with plans to eventually get to '75mg'$  bid as tolerated.    Follow Up:  Face to Face appointment with Clinical Pharmacist Practitioner scheduled for: 1 week  [name], PharmD Clinical Pharmacist [place] [phone]

## 2022-08-08 ENCOUNTER — Telehealth: Payer: Self-pay | Admitting: Pharmacist

## 2022-08-08 NOTE — Telephone Encounter (Signed)
Patient called this morning to report that he called [facility] on Friday. He spoke to several representatives and "finally" spoke to someone that told him that he was covered 100%. He states that he was told that both the chair and the lift motor was covered 100%.  Patient is asking me to call [facility] again to verify if the chair will be covered.  We have called already last week and we were not able to get a direct answer.  Also spoke with several representatives from medical supply stores and they instructed that only the lift mechanism / motor was covered.   I did call [facility] again. Initially we were told that he could get a seat lift at 100% coverage but this is just a mechanism that is place on a chair to assist with getting out of the chair. It is not what patient is looking for which is a chair / recliner that fully lifts. We then spoke to a Freight forwarder at [facility]. The manager reviewed the notes from Mr. Kimberlin' discussion with Eliza a [place] representative on Friday night.  Per the member Risk analyst, in [place] notes she references that patient is covered 100% for a Animator. This actually is not a recliner type lift chair. The [employer] lift is actually a transfer chair. I appears that the information provided to patient Friday 1/5 was not accurate for the product he is looking for.  I again explained to patient that I feel that the medical supply stores are corrent in that only the motorizes lift portion of the chair will be covered (80% by [facility] and 20% by Medicaid) however the chair portion will not be covered. I encouraged him to contact [facility] to see what their price will be for the chair after Medicare/ Medicaid coverage of the Lift mechanism.  Unfortunately this has been a frustrating process for the patient due to the difficulty with explaining exactly what he is trying to get covered.   Patient has paperwork with provider information and also a prescription from Dr [name] that our office completed in December 2023.  He will need the medical supply store to complete the rest of paperwork and submit to [facility].

## 2022-08-10 ENCOUNTER — Ambulatory Visit (INDEPENDENT_AMBULATORY_CARE_PROVIDER_SITE_OTHER): Payer: 59 | Admitting: Psychology

## 2022-08-10 DIAGNOSIS — F331 Major depressive disorder, recurrent, moderate: Secondary | ICD-10-CM | POA: Diagnosis not present
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
## 2022-08-11 ENCOUNTER — Telehealth: Payer: Self-pay

## 2022-08-11 NOTE — Patient Outreach (Signed)
  Care Coordination   08/11/2022 Name: Shawn Meza MRN: [id number] DOB: 1945-12-18   Care Coordination Outreach Attempts:  An unsuccessful telephone outreach was attempted for a scheduled appointment today.  Follow Up Plan:  Additional outreach attempts will be made to offer the patient care coordination information and services.   Encounter Outcome:  No Answer   Care Coordination Interventions:  No, not indicated    Thea Silversmith, RN, MSN, BSN, [place] Coordinator [phone]

## 2022-08-12 ENCOUNTER — Telehealth: Payer: Self-pay | Admitting: Pharmacist

## 2022-08-12 ENCOUNTER — Telehealth: Payer: 59

## 2022-08-12 NOTE — Telephone Encounter (Signed)
Patient was due to have phone visit with Clinical Pharmacist Practitioner to follow up medication management today. Unable to reach patient by phone.  LM on VM with CB# [phone] or [phone].  Will continue to outreach patient over the next 30 days.

## 2022-08-16 ENCOUNTER — Telehealth: Payer: Self-pay | Admitting: *Deleted

## 2022-08-16 NOTE — Progress Notes (Signed)
  Care Coordination Note  08/16/2022 Name: Shawn Meza MRN: [id number] DOB: Jan 06, 1946  Shawn Meza is a 77 y.o. year old male who is a primary care patient of [name], DO and is actively engaged with the care management team. I reached out to [name] by phone today to assist with re-scheduling a follow up visit with the RN Case Manager  Follow up plan: Patient declines further follow up and engagement by the care management team. Appropriate care team members and provider have been notified via electronic communication.   Julian Hy, [place] Direct Dial: [phone]

## 2022-08-24 ENCOUNTER — Ambulatory Visit (INDEPENDENT_AMBULATORY_CARE_PROVIDER_SITE_OTHER): Payer: 59 | Admitting: Psychology

## 2022-08-24 DIAGNOSIS — F331 Major depressive disorder, recurrent, moderate: Secondary | ICD-10-CM
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
## 2022-08-26 ENCOUNTER — Ambulatory Visit (INDEPENDENT_AMBULATORY_CARE_PROVIDER_SITE_OTHER): Payer: 59 | Admitting: Pharmacist

## 2022-08-26 DIAGNOSIS — G20A1 Parkinson's disease without dyskinesia, without mention of fluctuations: Secondary | ICD-10-CM

## 2022-08-26 DIAGNOSIS — F339 Major depressive disorder, recurrent, unspecified: Secondary | ICD-10-CM

## 2022-08-26 DIAGNOSIS — I5022 Chronic systolic (congestive) heart failure: Secondary | ICD-10-CM

## 2022-08-26 DIAGNOSIS — Z79899 Other long term (current) drug therapy: Secondary | ICD-10-CM

## 2022-08-26 NOTE — Progress Notes (Signed)
Pharmacy Note  08/26/2022 Name: Shawn Meza MRN: [id number] DOB: Mar 11, 1946  Subjective: Shawn Meza is a 77 y.o. year old male who is a primary care patient of [name], DO. Clinical Pharmacist Practitioner referral was placed to assist with medication management.    Engaged with patient by telephone for follow up visit today.  Medication Management:  Patient has been noted to take medications inconsistently. He often adjusts therapy himself due to suspected side effects. In past he has noted that several medications seem to cause drowsiness and fatigue.   As noted by his cardiologist recently this makes adjusting therapy difficult.  Two weeks ago I met with patient face to face to review medication regimen. Provided a weekly pill container to help with remembering to take medication. Assisted patient in filling weekly container. Also provided list of medications with directions and indications.    Depression - patient  reports he continues to talk to a counselor and this has been very beneficial. He also has bupropion 75mg  twice a day but he reports he is only taking 0.5 tablet daily. Patient reports that some night he has trouble sleeping due to having a lot on his mind. He also reports vivid dreams. He also reports some days he feel drowsy during the day.  Patient goes to bed at 11pm but usually does not fall asleep until around 1pm. He does not have a TV in his bedroom. Usually sleeps in his bedroom but sometimes sleeps in recliner in den. He does get up around 3 or 4 am to let his dogs out. He will take 0.5 tablet of Ibuprofen and will then go back to sleep until around 7am.  He tries to avoid napping during the day.   CHF: Patient reports that he has not noticed any swelling in his legs and his weight has been stable but he noted some redness in his lower extremities. He states skin is dry and he has used lotion without improved results. Patient is taking furosemide  40mg  2 times per week, metoprolol tartrate f25mg  daily ad Entresto once a day (usually twice daily but dose of lowered in past by cardiologist due to drowsiness/fatigue/dizziness.   Parkinson's Disease: We had been working with patient to get lift chair. The process had been a little frustrating for patient due to Medicare not covering the full chair, only the lift mechanism of the chair. Patient reports today that he has gotten a lift chair - ordered from [facility]. He did not get thru his Medicare or medicaid benefits.     Objective: Review of patient status, including review of consultants reports, laboratory and other test data, was performed as part of comprehensive.  Lab Results  Component Value Date   CREATININE 0.98 07/01/2022   CREATININE 1.18 03/31/2022   CREATININE 0.95 10/22/2021    Lab Results  Component Value Date   HGBA1C 5.7 (H) 12/19/2018       Component Value Date/Time   CHOL 163 03/31/2022 1708   TRIG 214 (H) 03/31/2022 1708   TRIG 107 05/09/2010 0000   HDL 36 (L) 03/31/2022 1708   CHOLHDL 4.5 03/31/2022 1708   CHOLHDL 3.6 05/20/2015 1410   VLDL 27 05/20/2015 1410   LDLCALC 91 03/31/2022 1708     Clinical ASCVD: Yes  The ASCVD Risk score (Arnett DK, et al., 2019) failed to calculate for the following reasons:   The patient has a prior MI or stroke diagnosis    BP Readings  from Last 3 Encounters:  08/04/22 122/72  07/01/22 136/74  05/27/22 136/72     Allergies  Allergen Reactions   Tizanidine Hcl Hives   Fluoxetine Other (See Comments)    Caused depression and aggression   Rosuvastatin Other (See Comments)    Whole body aches   Testosterone Other (See Comments)    ABDOMINAL PAIN and cramping   Requip [Ropinirole] Nausea Only    Medications Reviewed Today     Reviewed by [name], RPH-CPP (Pharmacist) on 08/04/22 at 1553  Med List Status: <None>   Medication Order Taking? Sig Documenting Provider Last Dose Status Informant   acetaminophen (TYLENOL) 325 MG tablet [id number] Yes Take 162.5 mg by mouth every 6 (six) hours as needed for mild pain or moderate pain. [provider] Taking Active   aspirin 81 MG EC tablet [id number] Yes Take 1 tablet (81 mg total) by mouth daily. [name], DO Taking Active   buPROPion [facility]) 75 MG tablet [id number] Yes Take 1 tablet (75 mg total) by mouth 2 (two) times daily.  Patient taking differently: Take 37.5 mg by mouth daily.   [name], DO Taking Active   Carbidopa-Levodopa ER Blenda Peals) 48.75-195 MG CPCR [id number] Yes Take 1 capsule by mouth 3 (three) times daily. 7am/11am/4pm  Patient taking differently: Take 1 capsule by mouth in the morning and at bedtime. 7am/11am/4pm   [name], Octaviano Batty, DO Taking Active   esomeprazole (NEXIUM) 40 MG capsule [id number] Yes Take 1 capsule (40 mg total) by mouth at bedtime.  Patient taking differently: Take 40 mg by mouth at bedtime as needed.   [name], DO Taking Active   FLOVENT HFA 110 MCG/ACT inhaler [id number] Yes INHALE 1 PUFF BY MOUTH INTO LUNGS DAILY AS NEEDED FOR WHEEZING, OR FOR SHORTNESS OF BREATH. [name], DO Taking Active   fluticasone (FLONASE) 50 MCG/ACT nasal spray [id number] Yes Place 2 sprays into both nostrils daily. [name], DO Taking Active   furosemide (LASIX) 40 MG tablet [id number] Yes Take 20 mg by mouth as needed for fluid or edema (shortness of breath). [provider] Taking Active            Med Note Clydie Braun, Babette Relic B   Thu Jul 07, 2022 11:19 AM) Patient reports he is taking 1 or 2 times per week.  gabapentin (NEURONTIN) 100 MG capsule [id number] No Take 1 capsule (100 mg total) by mouth at bedtime.  Patient not taking: Reported on 08/04/2022   [name], DO Not Taking Active   levocetirizine (XYZAL) 5 MG tablet [id number] Yes Take 1 tablet (5 mg total) by mouth every evening. [name], DO Taking Active    metoprolol tartrate (LOPRESSOR) 25 MG tablet [id number] Yes Take 1 tablet (25 mg total) by mouth daily. Baldo Daub, MD Taking Active   nitroGLYCERIN (NITROSTAT) 0.4 MG SL tablet [id number]  Place 1 tablet (0.4 mg total) under the tongue every 5 (five) minutes x 3 doses as needed for chest pain. If chest pain is not relieved after 2nd dose - call 911. [name], DO  Active   pravastatin (PRAVACHOL) 20 MG tablet [id number] Yes Take 1 tablet (20 mg total) by mouth daily. [name], DO Taking Active   prednisoLONE acetate (PRED FORTE) 1 % ophthalmic suspension [id number] No Place 1 drop into both eyes 2 (two) times daily.  Patient not taking: Reported on 07/07/2022   [provider] Not Taking Active Self  sacubitril-valsartan (ENTRESTO) 24-26 MG [id number] Yes Take 1 tablet by mouth daily. Baldo Daub, MD Taking Active   senna-docusate (SENNA PLUS) 8.6-50 MG tablet [id number]  Take 1 tablet by mouth daily as needed for mild constipation. [provider]  Active             Patient Active Problem List   Diagnosis Date Noted   Major depressive disorder, recurrent episode, moderate (HCC) 06/30/2022   Lumbar spondylosis 06/29/2022   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   Diarrhea 11/04/2020   Rectal bleeding 11/04/2020   Trochanteric bursitis of right hip    Transient ischemic attack    NSTEMI (non-ST elevated myocardial infarction) (HCC)    Nephrolithiasis    Metabolic syndrome    Major depression, chronic    Long-term use of aspirin therapy    Hypercholesterolemia    Hiatal hernia    GERD (gastroesophageal reflux disease)    Essential tremor    Essential hypertension    Erectile dysfunction    Elevated PSA    Colon cancer (HCC)    Chronic coronary artery disease    Arthritis    Myofascial pain 12/20/2019   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   Low back pain 10/28/2019   Radiculopathy, lumbar region 10/28/2019   Fatigue  01/15/2019   Chronic systolic (congestive) heart failure (HCC) 09/18/2018   Ischemic cardiomyopathy 09/16/2018   Aortic regurgitation 09/16/2018   Cardiomyopathy, unspecified (HCC) 06/13/2018   Abscess of right axilla 12/12/2017   BMI 33.0-33.9,adult 12/12/2017   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   Acute blood loss anemia 10/25/2017   Acute postoperative respiratory insufficiency 10/25/2017   Postoperative delirium 10/25/2017   Dyslipidemia 10/17/2017   SOB (shortness of breath) 03/31/2017   Long term current use of aspirin 03/29/2017   Parkinson's disease 01/02/2017   Memory change 07/06/2015   Depression, recurrent (HCC) 07/06/2015   Medial meniscus tear 10/11/2011   Neuroma of foot 10/11/2011   Coronary artery disease involving native coronary artery of native heart with angina pectoris (HCC) 12/28/2010   OTHER TESTICULAR HYPOFUNCTION 05/20/2010   Mixed hyperlipidemia 05/20/2010   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/20/2010   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 05/20/2010   GERD 05/20/2010   TRANSIENT ISCHEMIC ATTACK, HX OF 05/20/2010   NEPHROLITHIASIS, HX OF 05/20/2010   BENIGN PROSTATIC HYPERTROPHY, HX OF, S/P TURP 05/20/2010     Medication Assistance:  None required.  Patient affirms current coverage meets needs. (Has [facility] Medicare and Medicaid coverage   Assessment / Plan: CHF / hypertension:  Controlled per last BNP and patient symptoms Continue current medication therapy - metoprolol 25mg  once a day, Entresto once a day and furosemide once weekly as needed.  Appointment made to see PCP to evaluate lower extremity erythema/ redness.   Parkinson's: Continue Rytary ER per Dr Don Perking directions.  Difficulty Sleeping: Discussed that Rytary could cause vivid dreams and possibly some insomnia. Recommended he discuss with Dr [name] at next appointment.  Patient has prescription for gabapentin that he has not taken recently.  Recommended he could try gabapentin at night to ses if this would help with both leg pain and sleep. He also could consider taking the 0.5 tablet of ibuprofen earlier in the night since it seems to help with sleep when it takes it around 3 or 4am.   Depression:  Suggested he try increasing bupropion to 75mg  each morning. (Could work up to twice a day but recommended 2nd  dose be taken before 4pm and monitor for worsening sleep disturbances)   Medication management:  Reviewed and updated medication list Reviewed refill history and adherence.  Encouraged patient to continue to use weekly pill container to help with medication adherance.    Follow Up:  3 to 4 weeks.    [name], PharmD Clinical Pharmacist [facility]  - [facility] [phone]

## 2022-08-29 ENCOUNTER — Ambulatory Visit (INDEPENDENT_AMBULATORY_CARE_PROVIDER_SITE_OTHER): Payer: 59 | Admitting: Psychology

## 2022-08-29 DIAGNOSIS — F331 Major depressive disorder, recurrent, moderate: Secondary | ICD-10-CM | POA: Diagnosis not present
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
## 2022-08-31 ENCOUNTER — Ambulatory Visit (INDEPENDENT_AMBULATORY_CARE_PROVIDER_SITE_OTHER): Payer: 59 | Admitting: Family Medicine

## 2022-08-31 ENCOUNTER — Ambulatory Visit: Payer: Medicare Other | Admitting: Psychology

## 2022-08-31 ENCOUNTER — Encounter: Payer: Self-pay | Admitting: Family Medicine

## 2022-08-31 VITALS — BP 138/80 | HR 102 | Temp 98.4°F | Ht 66.0 in | Wt 205.1 lb

## 2022-08-31 DIAGNOSIS — L989 Disorder of the skin and subcutaneous tissue, unspecified: Secondary | ICD-10-CM

## 2022-08-31 DIAGNOSIS — L853 Xerosis cutis: Secondary | ICD-10-CM | POA: Diagnosis not present

## 2022-08-31 NOTE — Progress Notes (Signed)
Chief Complaint  Patient presents with   legs have redness/cold    Shawn Meza is a 77 y.o. male here for a skin complaint.  Duration: 1 month Location: legs Pruritic? No Painful? No Drainage? No New soaps/lotions/topicals/detergents? No Sick contacts? No Other associated symptoms: feels cold to touch Therapies tried thus far: none  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   Diarrhea 11/04/2020   Elevated PSA    Erectile dysfunction    Essential hypertension    Essential tremor    GERD (gastroesophageal reflux disease)    Headache(784.0)    Hiatal hernia    Hypercholesterolemia    Long-term use of aspirin therapy    Low back pain 10/28/2019   Major depression, chronic    Medial meniscus tear 8/54/6270   Metabolic syndrome    Morbid obesity ([place])    Myofascial pain 12/20/2019   Nephrolithiasis    hx of   NSTEMI (non-ST elevated myocardial infarction) ([place])    Parkinson's disease    S/P CABG (coronary artery bypass graft)    Transient ischemic attack    hx of   Trochanteric bursitis of right hip     BP 138/80 (BP Location: Left Arm, Patient Position: Sitting, Cuff Size: Normal)   Pulse (!) 102   Temp 98.4 F (36.9 C) (Oral)   Ht '5\' 6"'$  (1.676 m)   Wt 205 lb 2 oz (93 kg)   SpO2 98%   BMI 33.11 kg/m  Gen: awake, alert, appearing stated age Lungs: No accessory muscle use Skin: purplish skin with blanching and slow cap refill. There is scaling over the anterior legs b/l.  No drainage, erythema, TTP, fluctuance, excoriation Psych: Age appropriate judgment and insight  Skin lesion  Dry skin dermatitis  Reassurance for now. Needs to use topical emollient, hydrocortisone cream prn.  F/u prn. The patient voiced understanding and agreement to the plan.  [place], DO 08/31/22 3:13 PM

## 2022-08-31 NOTE — Patient Instructions (Signed)
Use a non-scented lotion daily on your legs.  The other lesions are not concerning, let me know if anything changes.  Let us know if you need anything.

## 2022-09-09 ENCOUNTER — Telehealth: Payer: Self-pay

## 2022-09-09 NOTE — Patient Outreach (Signed)
  Care Coordination   Case closed  Visit Note   09/09/2022 Name: Shawn Meza MRN: [id number] DOB: 1945/08/16  Shawn Meza is a 77 y.o. year old male who sees [name], Crosby Oyster, DO for primary care. No patient contact was made during this encounter.   What matters to the patients health and wellness today?  Per care guide, Patient declines further participation.    Goals Addressed             This Visit's Progress    COMPLETED: Care Coordination: Community resource needs       patient declines further participation-goals completed Care Coordination Interventions:  Collaborated with care guide regarding chair lift. Per Penni Bombard She assisted patient with calling to his insurance provider, but has no knowledge regarding paperwork Message to provider CMA, Robin Ewing, CMA to follow up on paperwork       SDOH assessments and interventions completed:  No  Care Coordination Interventions:  No, not indicated   Follow up plan: No further intervention required.   Encounter Outcome:  Pt. Visit Completed   Thea Silversmith, RN, MSN, BSN, [place] Coordinator [phone]

## 2022-09-14 ENCOUNTER — Ambulatory Visit: Payer: 59 | Admitting: Psychology

## 2022-09-20 ENCOUNTER — Telehealth: Payer: Self-pay | Admitting: Family Medicine

## 2022-09-20 NOTE — Telephone Encounter (Signed)
Patient informed of OTC response.

## 2022-09-20 NOTE — Telephone Encounter (Signed)
Pt states he has congestion and he would like something called in. He stated pcp told him he did not need another appt and did not have to take a covid test since he doesn't have a fever.    [place], [place], [place] [address] Phone: [phone]  Fax: [phone]

## 2022-09-21 ENCOUNTER — Ambulatory Visit (INDEPENDENT_AMBULATORY_CARE_PROVIDER_SITE_OTHER): Payer: 59 | Admitting: Psychology

## 2022-09-21 DIAGNOSIS — F331 Major depressive disorder, recurrent, moderate: Secondary | ICD-10-CM | POA: Diagnosis not present
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
## 2022-09-22 ENCOUNTER — Telehealth: Payer: Self-pay | Admitting: Family Medicine

## 2022-09-22 NOTE — Telephone Encounter (Signed)
Received call advising that the manfacture is no longer going to be making flovent inhaler

## 2022-09-22 NOTE — Telephone Encounter (Signed)
Continued from previous note:    Since Flovent inhaler is no longer manufactured, we could send in either Qvar or Arnuity as an alternative. Please send alternative to [facility]

## 2022-09-23 ENCOUNTER — Other Ambulatory Visit: Payer: Self-pay | Admitting: Family Medicine

## 2022-09-23 MED ORDER — QVAR REDIHALER 40 MCG/ACT IN AERB: 2.0000 | INHALATION_SPRAY | Freq: Two times a day (BID) | RESPIRATORY_TRACT | 2 refills | Status: DC

## 2022-09-28 ENCOUNTER — Ambulatory Visit (INDEPENDENT_AMBULATORY_CARE_PROVIDER_SITE_OTHER): Payer: 59 | Admitting: Pharmacist

## 2022-09-28 ENCOUNTER — Ambulatory Visit (INDEPENDENT_AMBULATORY_CARE_PROVIDER_SITE_OTHER): Payer: 59 | Admitting: Psychology

## 2022-09-28 DIAGNOSIS — F329 Major depressive disorder, single episode, unspecified: Secondary | ICD-10-CM

## 2022-09-28 DIAGNOSIS — F331 Major depressive disorder, recurrent, moderate: Secondary | ICD-10-CM | POA: Diagnosis not present

## 2022-09-28 DIAGNOSIS — Z79899 Other long term (current) drug therapy: Secondary | ICD-10-CM

## 2022-09-28 DIAGNOSIS — I255 Ischemic cardiomyopathy: Secondary | ICD-10-CM

## 2022-09-28 DIAGNOSIS — I110 Hypertensive heart disease with heart failure: Secondary | ICD-10-CM

## 2022-09-28 NOTE — Progress Notes (Signed)
Pharmacy Note  09/28/2022 Name: Shawn Meza MRN: [id number] DOB: [birth year]  Subjective: Shawn Meza is a 77 y.o. year old male who is a primary care patient of Shawn Pal, DO. Clinical Pharmacist Practitioner referral was placed to assist with medication management.    Engaged with patient by telephone for follow up visit today.  Medication Management:  Patient has been noted to take medications inconsistently. He often adjusts therapy himself due to suspected side effects. In past he has noted that several medications seem to cause drowsiness and fatigue.   As noted by his cardiologist recently this makes adjusting therapy difficult.  Patient is still using weekly pill container to help with remembering to take medication.   Depression -  Patient's sister in [place] had surgery today for a  brain tumor. Patient is a little stressed today. He did see his counselor today.  Current therapy is bupropion '75mg'$  twice a day but he reports he is only taking 1 tablet "a few days per week"; He noted that earlier this week he took bupropion and felt much better. Patient is trying to get outside more and walking a little which he states helps with joints and mood.  He has taken gabapentin at bedtime if needed and this has helped with sleep and nerve pain.   CHF:  Current therapy: taking furosemide '40mg'$  2 times per week, metoprolol tartrate '25mg'$  daily and Entresto once a day (usually twice daily but dose of lowered in past by cardiologist due to drowsiness/fatigue/dizziness.  Shawn Meza swelling in his legs. Weight has been stable.   Parkinson's Disease:  Current therapy - Rytary 3 times a day (patient only taking twice a day)     Objective: Review of patient status, including review of consultants reports, laboratory and other test data, was performed as part of comprehensive.  Lab Results  Component Value Date   CREATININE 0.98 07/01/2022   CREATININE 1.18 03/31/2022    CREATININE 0.95 10/22/2021    Lab Results  Component Value Date   HGBA1C 5.7 (H) 12/19/2018       Component Value Date/Time   CHOL 163 03/31/2022 1708   TRIG 214 (H) 03/31/2022 1708   TRIG 107 05/09/2010 0000   HDL 36 (L) 03/31/2022 1708   CHOLHDL 4.5 03/31/2022 1708   CHOLHDL 3.6 05/20/2015 1410   VLDL 27 05/20/2015 1410   LDLCALC 91 03/31/2022 1708     Clinical ASCVD: Yes  The ASCVD Risk score (Arnett DK, et al., 2019) failed to calculate for the following reasons:   The patient has a prior MI or stroke diagnosis    BP Readings from Last 3 Encounters:  08/31/22 138/80  08/04/22 122/72  07/01/22 136/74     Allergies  Allergen Reactions   Tizanidine Hcl Hives   Fluoxetine Other (See Comments)    Caused depression and aggression   Rosuvastatin Other (See Comments)    Whole body aches   Testosterone Other (See Comments)    ABDOMINAL PAIN and cramping   Requip [Ropinirole] Nausea Only    Medications Reviewed Today     Reviewed by Shawn Pal, DO (Physician) on 08/31/22 at 1513  Med List Status: <None>   Medication Order Taking? Sig Documenting Provider Last Dose Status Informant  acetaminophen (TYLENOL) 325 MG tablet [id number] No Take 162.5 mg by mouth every 6 (six) hours as needed for mild pain or moderate pain. [provider] Taking Active   aspirin 81 MG EC tablet [id number]  No Take 1 tablet (81 mg total) by mouth daily. Shawn Pal, DO Taking Active   buPROPion [facility]) 75 MG tablet [id number] No Take 1 tablet (75 mg total) by mouth 2 (two) times daily.  Patient taking differently: Take 37.5 mg by mouth daily.   Shawn Pal, DO Taking Active   Carbidopa-Levodopa ER (RYTARY) 48.75-195 MG CPCR [id number] No Take 1 capsule by mouth 3 (three) times daily. 7am/11am/4pm  Patient taking differently: Take 1 capsule by mouth in the morning and at bedtime. 7am/11am/4pm   [name], Eustace Quail, DO Taking Active   esomeprazole  (NEXIUM) 40 MG capsule [id number] No Take 1 capsule (40 mg total) by mouth at bedtime.  Patient taking differently: Take 40 mg by mouth at bedtime as needed.   Shawn Pal, DO Taking Active   FLOVENT HFA 110 MCG/ACT inhaler [id number] No INHALE 1 PUFF BY MOUTH INTO LUNGS DAILY AS NEEDED FOR WHEEZING, OR FOR SHORTNESS OF BREATH. Shawn Pal, DO Taking Active   fluticasone (FLONASE) 50 MCG/ACT nasal spray [id number] No Place 2 sprays into both nostrils daily. Shawn Pal, DO Taking Active   furosemide (LASIX) 40 MG tablet [id number] No Take 20 mg by mouth as needed for fluid or edema (shortness of breath). [provider] Taking Active            Med Note Antony Contras, Lynelle Smoke B   Thu Jul 07, 2022 11:19 AM) Patient reports he is taking 1 or 2 times per week.  gabapentin (NEURONTIN) 100 MG capsule [id number] No Take 1 capsule (100 mg total) by mouth at bedtime. Shawn Pal, DO Taking Active   levocetirizine (XYZAL) 5 MG tablet [id number] No Take 1 tablet (5 mg total) by mouth every evening. Shawn Pal, DO Taking Active   metoprolol tartrate (LOPRESSOR) 25 MG tablet [id number] No Take 1 tablet (25 mg total) by mouth daily. Richardo Priest, MD Taking Active   nitroGLYCERIN (NITROSTAT) 0.4 MG SL tablet [id number] No Place 1 tablet (0.4 mg total) under the tongue every 5 (five) minutes x 3 doses as needed for chest pain. If chest pain is not relieved after 2nd dose - call 911. Shawn Pal, DO Taking Active   pravastatin (PRAVACHOL) 20 MG tablet [id number] No Take 1 tablet (20 mg total) by mouth daily. Shawn Pal, DO Taking Active   sacubitril-valsartan (ENTRESTO) 24-26 MG [id number] No Take 1 tablet by mouth daily. Richardo Priest, MD Taking Active             Patient Active Problem List   Diagnosis Date Noted   Major depressive disorder, recurrent episode, moderate ([place]) 06/30/2022   Lumbar spondylosis 06/29/2022   SI  joint arthritis 09/16/2021   Diarrhea 11/04/2020   Rectal bleeding 11/04/2020   Trochanteric bursitis of right hip    Transient ischemic attack    NSTEMI (non-ST elevated myocardial infarction) ([place])    Nephrolithiasis    Metabolic syndrome    Major depression, chronic    Long-term use of aspirin therapy    Hypercholesterolemia    Hiatal hernia    GERD (gastroesophageal reflux disease)    Essential tremor    Essential hypertension    Erectile dysfunction    Elevated PSA    Colon cancer ([place])    Chronic coronary artery disease    Arthritis    Myofascial pain 12/20/2019   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   Low back pain 10/28/2019   Radiculopathy, lumbar region 10/28/2019  Fatigue [id number]   Chronic systolic (congestive) heart failure (HCC) 09/18/2018   Ischemic cardiomyopathy 09/16/2018   Aortic regurgitation 09/16/2018   Cardiomyopathy, unspecified ([place]) 06/13/2018   Abscess of right axilla 12/12/2017   BMI 33.0-33.9,adult 12/12/2017   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   Acute blood loss anemia 10/25/2017   Acute postoperative respiratory insufficiency 10/25/2017   Postoperative delirium 10/25/2017   Dyslipidemia 10/17/2017   SOB (shortness of breath) 03/31/2017   Long term current use of aspirin 03/29/2017   Parkinson's disease 01/02/2017   Memory change 07/06/2015   Depression, recurrent ([place]) 07/06/2015   Medial meniscus tear 10/11/2011   Neuroma of foot 10/11/2011   Coronary artery disease involving native coronary artery of native heart with angina pectoris ([place]) 12/28/2010   OTHER TESTICULAR HYPOFUNCTION 05/20/2010   Mixed hyperlipidemia 05/20/2010   Hypertensive heart disease with heart failure ([place]) 05/20/2010   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 05/20/2010   GERD 05/20/2010   TRANSIENT ISCHEMIC ATTACK, HX OF 05/20/2010   NEPHROLITHIASIS, HX OF 05/20/2010   BENIGN PROSTATIC HYPERTROPHY, HX OF, S/P TURP 05/20/2010      Medication Assistance:  None required.  Patient affirms current coverage meets needs. (Has [facility] Medicare and Medicaid coverage   Assessment / Plan: CHF / hypertension:  Controlled per last BNP and patient symptoms Continue current medication therapy - metoprolol '25mg'$  once a day, Entresto once a day and furosemide once weekly as needed.   Parkinson's: Continue Rytary ER per Dr Doristine Devoid directions.   Difficulty Sleeping: Continue gabapentin '100mg'$  at night for nerve pain.    Depression:  Suggested he try increasing bupropion to '75mg'$  each morning. (Could work up to twice a day but recommended 2nd dose be taken before 4pm and monitor for worsening sleep disturbances)   Medication management:  Reviewed and updated medication list Reviewed refill history and adherence.  Encouraged patient to continue to use weekly pill container to help with medication adherance.  Verified that Qvar was covered by his insurance. Reviewed administration instructions and reminded patient to rinse mouth after each use.    Follow Up:  2 to 3 months   [name], PharmD Clinical Pharmacist [place] [phone]
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
## 2022-10-05 ENCOUNTER — Ambulatory Visit: Payer: 59 | Admitting: Psychology

## 2022-10-12 ENCOUNTER — Ambulatory Visit (INDEPENDENT_AMBULATORY_CARE_PROVIDER_SITE_OTHER): Payer: 59 | Admitting: Psychology

## 2022-10-12 DIAGNOSIS — F331 Major depressive disorder, recurrent, moderate: Secondary | ICD-10-CM
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
## 2022-10-17 ENCOUNTER — Other Ambulatory Visit: Payer: Self-pay

## 2022-10-17 NOTE — Progress Notes (Unsigned)
Assessment/Plan:   1.  Parkinsons Disease  -Clinically, he is always worse when he is off of levodopa.  He has tried nearly every form of levodopa on the market. However, he often doesn't take med or doesn't take it as directed.  He is taking rytary max bid (but hasn't taken it yet today and seen at 3:15 today and the same was true last visit).  Will try to increase to rytary 245 mg tid, but I do think that if he would take it regularly, we likely would not have to increase it.    Samples were provided.  Long discussion on compliance.  2.  Depression  -Supposed to be on bupropion, 75 mg twice per day, but is only taking half a tablet daily  -He is following with Apolonio Schneiders and likes him.  Glad to see he has been compliant with this  3.  B12 deficiency  -On supplementation.  4.  Lumbar spinal stenosis  -has followed with Dr. Vertell Limber and [place]  -Has seen Dr. Raeford Razor   5.  Insomnia  -Continue melatonin to 3 mg.  6.  SOB  `-Patient previously attributed this to [place], but cardiology visit note clearly that patient has severe cardiomyopathy and not taking his diuretic, which has resulted in more edema.  He has restarted rytary and is tolerating it well Subjective:   Shawn Meza was seen today in follow up for Parkinsons disease.  My previous records were reviewed prior to todays visit as well as outside records available to me.  Last visit, I gave the patient samples of Rytary and increased it to 195 mg 3 times per day and encouraged him to take it 3 times per day.  Records report he is taking bid but pt reports he is trying to taking tid.  He is following with Dr. Cheryln Manly regularly and I am happy to see that.  Records indicate that patient continues to take his other medications fairly inconsistently as well.  He has been taking half a tablet of his bupropion instead of 1 tablet twice per day.  Does c/o continued back pain.  States that he saw Dr. Reatha Armour.  He referred him for  injections, but it sounds like there was some misunderstanding and injections did not happen.  Current prescribed movement disorder medications: Rytary, 195 mg, 1 po tid (he reports taking it twice daily) B12 supplement Melatonin, 3 mg  Prior medications: Carbidopa/levodopa 25/100 IR: Carbidopa/levodopa 25/100 CR: Carbidopa/levodopa 50/200 CR; rytary (c/o dry mouth, trouble breathing at night and constipation); Trintellix (he thought it caused dreams, even though we told him that it was the Parkinsons that was causing the dreaming)   ALLERGIES:   Allergies  Allergen Reactions   Tizanidine Hcl Hives   Fluoxetine Other (See Comments)    Caused depression and aggression   Rosuvastatin Other (See Comments)    Whole body aches   Testosterone Other (See Comments)    ABDOMINAL PAIN and cramping   Requip [Ropinirole] Nausea Only    CURRENT MEDICATIONS:  Outpatient Encounter Medications as of 10/18/2022  Medication Sig   acetaminophen (TYLENOL) 325 MG tablet Take 162.5 mg by mouth every 6 (six) hours as needed for mild pain or moderate pain.   amiodarone (PACERONE) 200 MG tablet Take 200 mg by mouth daily.   aspirin 81 MG EC tablet Take 1 tablet (81 mg total) by mouth daily.   atorvastatin (LIPITOR) 20 MG tablet Take 20 mg by mouth daily.  beclomethasone (QVAR REDIHALER) 40 MCG/ACT inhaler Inhale 2 puffs into the lungs 2 (two) times daily.   buPROPion (WELLBUTRIN) 75 MG tablet Take 1 tablet (75 mg total) by mouth 2 (two) times daily. (Patient taking differently: Take 37.5 mg by mouth daily.)   Carbidopa-Levodopa ER (RYTARY) 48.75-195 MG CPCR Take 1 capsule by mouth 3 (three) times daily. 7am/11am/4pm (Patient taking differently: Take 1 capsule by mouth in the morning and at bedtime. 7am/11am/4pm)   clopidogrel (PLAVIX) 75 MG tablet Take 75 mg by mouth daily.   erythromycin ophthalmic ointment Place 1 Application into both eyes at bedtime.   escitalopram (LEXAPRO) 5 MG tablet Take 5 mg by  mouth daily as needed (anxiety).   esomeprazole (NEXIUM) 40 MG capsule Take 1 capsule (40 mg total) by mouth at bedtime. (Patient taking differently: Take 40 mg by mouth at bedtime as needed.)   fluticasone (FLONASE) 50 MCG/ACT nasal spray Place 2 sprays into both nostrils daily.   furosemide (LASIX) 40 MG tablet Take 20 mg by mouth as needed for fluid or edema (shortness of breath).   gabapentin (NEURONTIN) 100 MG capsule Take 1 capsule (100 mg total) by mouth at bedtime.   levocetirizine (XYZAL) 5 MG tablet Take 1 tablet (5 mg total) by mouth every evening.   metoprolol tartrate (LOPRESSOR) 25 MG tablet Take 1 tablet (25 mg total) by mouth daily.   nitroGLYCERIN (NITROSTAT) 0.4 MG SL tablet Place 1 tablet (0.4 mg total) under the tongue every 5 (five) minutes x 3 doses as needed for chest pain. If chest pain is not relieved after 2nd dose - call 911.   pravastatin (PRAVACHOL) 20 MG tablet Take 1 tablet (20 mg total) by mouth daily.   sacubitril-valsartan (ENTRESTO) 24-26 MG Take 1 tablet by mouth daily.   No facility-administered encounter medications on file as of 10/18/2022.    Objective:   PHYSICAL EXAMINATION:    VITALS:   Vitals:   10/18/22 1440  BP: 122/84  Pulse: 68  SpO2: 99%  Weight: 203 lb 6.4 oz (92.3 kg)  Height: 5\' 6"  (1.676 m)   GEN:  The patient appears stated age and is in NAD. HEENT:  Normocephalic, atraumatic.  The mucous membranes are moist.   Neurological examination:  Orientation: The patient is alert and oriented x3. Cranial nerves: There is good facial symmetry with facial hypomimia. The speech is fluent and clear. Soft palate rises symmetrically and there is no tongue deviation. Hearing is intact to conversational tone. Sensation: Sensation is intact to light touch throughout Motor: Strength is at least antigravity x4.  Pt seen at 3:15 but hasn't taken medication today:  Movement examination: Tone: There is mild increased tone in the LUE Abnormal  movements: there is LUE/LLE rest tremor Coordination:  There is decremation, with any form of RAMS, including alternating supination and pronation of the forearm, hand opening and closing, finger taps, heel taps and toe taps on the L Gait and Station: The patient is a bit slow to arise.  The patient's stride length is actually quite good today, especially considering it has been a long time since he has taken medication.  He just slightly drags the left leg.  This is similar to last visit.  I have reviewed and interpreted the following labs independently    Chemistry      Component Value Date/Time   NA 141 07/01/2022 1654   K 4.6 07/01/2022 1654   CL 105 07/01/2022 1654   CO2 20 07/01/2022 1654  BUN 14 07/01/2022 1654   CREATININE 0.98 07/01/2022 1654   CREATININE 1.03 05/20/2015 1410      Component Value Date/Time   CALCIUM 9.3 07/01/2022 1654   ALKPHOS 85 03/31/2022 1708   AST 17 03/31/2022 1708   ALT 16 03/31/2022 1708   BILITOT 0.3 03/31/2022 1708       Lab Results  Component Value Date   WBC 8.2 03/31/2022   HGB 15.3 03/31/2022   HCT 44.5 03/31/2022   MCV 92 03/31/2022   PLT 199 03/31/2022    Lab Results  Component Value Date   TSH 1.910 03/31/2022   Total time spent on today's visit was 31 minutes, including both face-to-face time and nonface-to-face time.  Time included that spent on review of records (prior notes available to me/labs/imaging if pertinent), discussing treatment and goals, answering patient's questions and coordinating care.   Cc:  [name], DO

## 2022-10-18 ENCOUNTER — Encounter: Payer: Self-pay | Admitting: Neurology

## 2022-10-18 ENCOUNTER — Ambulatory Visit (INDEPENDENT_AMBULATORY_CARE_PROVIDER_SITE_OTHER): Payer: 59 | Admitting: Neurology

## 2022-10-18 VITALS — BP 122/84 | HR 68 | Ht 66.0 in | Wt 203.4 lb

## 2022-10-18 DIAGNOSIS — G20A1 Parkinson's disease without dyskinesia, without mention of fluctuations: Secondary | ICD-10-CM

## 2022-10-18 DIAGNOSIS — Z91199 Patient's noncompliance with other medical treatment and regimen due to unspecified reason: Secondary | ICD-10-CM

## 2022-10-18 MED ORDER — RYTARY 61.25-245 MG PO CPCR: 1.0000 | ORAL_CAPSULE | Freq: Three times a day (TID) | ORAL | 1 refills | Status: DC

## 2022-10-18 NOTE — Patient Instructions (Signed)
Increase rytary 245 mg, 1 capsule three times per day  Local and Online Resources for Power over Parkinson's Group  March 2024   LOCAL [place] PARKINSON'S GROUPS   Power over Parkinson's Group:    Power Over Parkinson's Patient Education Group will be Wednesday, March 13th-*Hybrid meting*- in person at [facility] location and via [facility], 2:00-3:00 pm.   Starting in November 2023, Power over [employer] and Care Partner Groups will meet together, with plans for separate break out session for caregivers (*this will be evolving over the next few months) Upcoming Power over Parkinson's Meetings/Care Partner Support:  2nd Wednesdays of the month at 2 pm:  March 13th, April 10th [place] at [email or web address] if interested in participating in this group    LOCAL EVENTS AND NEW OFFERINGS  Let's Try Pickleball-$25 for 6 weeks of Pickleball, starting February 2nd.  Contact Corwin Levins for more details.  [email or web address] NEW:  Parkinson's Social Game Night.  First Thursday of each month, 2:00-4:00 pm.  *Next date is MARCH 7th*.  [place], [employer].  Contact [email or web address] if interested. Parkinson's CarePartner Group for Men is in the works, if interested email [email or web address] ACT FITNESS Chair Yoga classes "Train and Gain", Fridays 10 am, [employer].  Contact Gina at [phone].  PWR! Moves [employer] Instructor-Led Classes offering at [employer]!  TUESDAYS and Wednesdays 1-2 pm.   Contact Vonna Kotyk at  [email or web address]  or [phone] (Tuesday classes are modified for chair and standing only) Drumming for Parkinson's will be held on 2nd and 4th Mondays at 11:00 am.   Located at the [employer] ([place]  Contact Doylene Canning at [email or web address] or [phone]  Dance for Parkinson 's classes will be on  Tuesdays 10-11 am starting in February. Located in the [employer] , in the first floor of the [employer] ([place] To register:  [email or web address] or [phone] [facility] [place] Class, Mondays at 11 am.  Call [phone] for details Moving [place].  Saturday, May 4th, 10 am start.  Register at [employer].[place]:  [email or web address]  PD Health at Home continues:  Mindfulness Mondays, Wellness Wednesdays, Fitness Fridays  (PWR! Moves as part of Fitness Fridays March 22nd, 1-1:45 pm) Upcoming Education:   Managing "Off" Periods:  Return of Parkinson's Symptoms.  Wednesday, March 20th, 1-2 pm Parkinson's 101.  Wednesday, April 3rd, 1-2 pm Expert Briefing:  Understanding Pain in Parkinson's.   Wednesday, March 13th, 1-2 pm  Research Update:  Working to [employer] PD.  Wednesday, April 10th, 1-2 pm Register for virtual education and Patent attorney (webinars) at [email or web address] Please check out their website to sign up for emails and see their full online offerings     [place]:  [email or web address]   Third Thursday Webinars:  On the third Thursday of every month at 12 p.m. ET, join our free live webinars to learn about various aspects of living with Parkinson's disease and our work to speed medical breakthroughs.  Upcoming Webinar:  Everyday Exposure to Parkinson's:  Environmental Connections to the Disease.  Thursday, March 21st at 12 noon. Check out additional information on their website to see their full online offerings    [facility]:  [email or web address]  Upcoming Webinar:   Nutrition and Parkinson's.  Wednesday, March 6th, 12 noon Webinar Series:  Living with Parkinson's Meetup.  Third Thursdays each month, 3 pm  Care Partner Monthly Meetup.  With Robin Searing Phinney.  First  Tuesday of each month, 2 pm  Check out additional information to Live Well Today on their website    Parkinson and Movement Disorders (PMD) Alliance:  www.[email or web address]  NeuroLife Online:  Online Education Events  Sign up for emails, which are sent weekly to give you updates on programming and online offerings    Parkinson's Association of the [place]:  www.[email or web address]  Information on online support groups, education events, and online exercises including Yoga, Parkinson's exercises and more-LOTS of information on links to PD resources and online events  Virtual Support Group through Parkinson's Association of the [place]; next one is scheduled for Wednesday, March 6th  MOVEMENT AND EXERCISE OPPORTUNITIES  PWR! Moves Classes at [place].  Wednesdays 10 and 11 am.   Contact Amy Gerrit Friends, PT [email or web address] if interested.  PWR! Moves Class offerings at [employer]. *TUESDAYS* and Wednesdays 1-2 pm.    Contact Vonna Kotyk at  [email or web address]    Parkinson's Wellness Recovery (PWR! Moves)  [email or web address].[email or web address]  Info on the PWR! Virtual Experience:  You will have access to our expertise?through self-assessment, guided plans that start with the PD-specific fundamentals, educational content, tips, Q&A with an expert, and a growing Art therapist of PD-specific pre-recorded and live exercise classes of varying types and intensity - both physical and cognitive! If that is not enough, we offer 1:1 wellness consultations (in-person or virtual) to personalize your PWR! Research scientist (medical).   [place] Fridays:   As part of the PD Health @ Home program, this free video series focuses each week on one aspect of fitness designed to support people living with Parkinson's.? These weekly videos highlight the [place] fitness guidelines for people with Parkinson's disease.   [email or web address]  Dance for PD website is offering free, live-stream classes throughout the week, as well as links to [employer] of classes:  [email or web address]  Virtual dance and Pilates for Parkinson's classes: Click on the Community Tab> Parkinson's Movement Initiative Tab.  To register for classes and for more information, visit [email or web address] and click the "community" tab.   [employer] Parkinson's Cycling Classes   [employer]:  Thursdays @ Noon-Live classes at [employer] ([employer] at [email or web address]?or [phone])  [facility] [employer] [employer]: Virtual Classes Mondays and Thursdays Jeanette Caprice classes Tuesday, Wednesday and Thursday (contact [place] at [email or web address] ?or [phone])  [place]  Varied levels of classes are offered Tuesdays and Thursdays at [employer].   Stretching with Verdis Frederickson weekly class is also offered for people with Parkinson's  To observe a class or for more information, call [phone] or email Hezzie Bump at [email or web address]   ADDITIONAL SUPPORT AND RESOURCES  [employer]:Online Caregiver Education Opportunities:  www.well-[email or web address]/caregiver-education/caregiver-support-group.  You may also contact Vickki Muff at [email or web address] or [phone].     Family Caregiver [phone].  Thursday, March 7th, 10:15-1:45 at [facility].  Register with [facility] (see above) Well-Spring Navigator:  Just1Navigator program, a?free service to help individuals and families through the journey of determining care for older adults.  The "Navigator" is a Vickki Muff, Education officer, museum, who will speak with a prospective client and/or loved ones to provide an assessment of the situation and a set of recommendations for a personalized care plan -- all free of charge, and whether?[employer]  offers the needed service or not. If the need is not a service we provide, we are well-connected with reputable programs in  town that we can refer you to.  www.well-springsolutions.org or to speak with the Navigator, call [phone].

## 2022-10-20 ENCOUNTER — Ambulatory Visit: Payer: 59 | Attending: Cardiology | Admitting: Cardiology

## 2022-10-20 ENCOUNTER — Encounter: Payer: Self-pay | Admitting: Cardiology

## 2022-10-20 VITALS — BP 110/60 | HR 85 | Ht 66.0 in | Wt 205.0 lb

## 2022-10-20 DIAGNOSIS — I5022 Chronic systolic (congestive) heart failure: Secondary | ICD-10-CM | POA: Diagnosis not present

## 2022-10-20 DIAGNOSIS — G20A1 Parkinson's disease without dyskinesia, without mention of fluctuations: Secondary | ICD-10-CM | POA: Diagnosis not present

## 2022-10-20 DIAGNOSIS — I25119 Atherosclerotic heart disease of native coronary artery with unspecified angina pectoris: Secondary | ICD-10-CM

## 2022-10-20 DIAGNOSIS — Z951 Presence of aortocoronary bypass graft: Secondary | ICD-10-CM

## 2022-10-20 DIAGNOSIS — I2510 Atherosclerotic heart disease of native coronary artery without angina pectoris: Secondary | ICD-10-CM | POA: Diagnosis not present

## 2022-10-20 NOTE — Progress Notes (Signed)
Cardiology Office Note:    Date:  10/20/2022   ID:  Shawn Meza, DOB 1945/12/02, MRN [id number]  PCP:  [name], DO  Cardiologist:  [name], MD    Referring MD: [name]*   Chief Complaint  Patient presents with   Follow-up  Weak tired  History of Present Illness:    Shawn Meza is a 77 y.o. 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  He is coming today to months for follow-up.  He said he is doing fair.  Weak tired exhausted.  However he tells me also that yesterday he walks his car and after that he had a lot of muscle aches.  No chest pain tightness squeezing pressure burning chest shortness of breath is there is sometimes he said he have to wake up in the middle of the night because of choking sensation and have to sit up.  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   Diarrhea 11/04/2020   Elevated PSA    Erectile dysfunction    Essential hypertension    Essential tremor    GERD (gastroesophageal reflux disease)    Headache(784.0)    Hiatal hernia    Hypercholesterolemia    Long-term use of aspirin therapy    Low back pain 10/28/2019   Major depression, chronic    Medial meniscus tear 123456   Metabolic syndrome    Morbid obesity ([place])    Myofascial pain 12/20/2019   Nephrolithiasis    hx of   NSTEMI (non-ST elevated myocardial infarction) ([place])    Parkinson's disease    S/P CABG (coronary artery bypass graft)    Transient ischemic attack    hx of   Trochanteric bursitis of right hip     Past Surgical  History:  Procedure Laterality Date   CARDIAC CATHETERIZATION  5/12,1/13   4 stents placed   COLON SURGERY     CORONARY ARTERY BYPASS GRAFT     KNEE ARTHROSCOPY  10/11/2011   Procedure: ARTHROSCOPY KNEE;  Surgeon: Lorn Junes, MD;  Location: [facility];  Service: Orthopedics;  Laterality: Left;  Left Knee Arthroscopy with Medial and Lateral Partial Menisectomy, Chondroplasty   LEFT HEART CATHETERIZATION WITH CORONARY ANGIOGRAM N/A 08/25/2011   Procedure: LEFT HEART CATHETERIZATION WITH CORONARY ANGIOGRAM;  Surgeon: Burnell Blanks, MD;  Location: [facility] CATH LAB;  Service: Cardiovascular;  Laterality: N/A;   LITHOTRIPSY     STERIOD INJECTION  10/11/2011   Procedure: STEROID INJECTION;  Surgeon: Lorn Junes, MD;  Location: [facility];  Service: Orthopedics;  Laterality: Right;  Steroid Injection Second Toe   TRANSURETHRAL RESECTION OF PROSTATE     URETHRAL DILATION      Current Medications: Current Meds  Medication Sig   acetaminophen (TYLENOL) 325 MG tablet Take 162.5 mg by mouth every 6 (six) hours as needed for mild pain or moderate pain.   amiodarone (PACERONE) 200 MG tablet Take 200 mg by mouth daily.   aspirin 81 MG EC tablet Take 1 tablet (81 mg total) by mouth daily.   atorvastatin (LIPITOR) 20 MG tablet Take  20 mg by mouth daily.   beclomethasone (QVAR REDIHALER) 40 MCG/ACT inhaler Inhale 2 puffs into the lungs 2 (two) times daily.   buPROPion (WELLBUTRIN) 75 MG tablet Take 37.5 mg by mouth daily.   Carbidopa-Levodopa ER (RYTARY) 61.25-245 MG CPCR Take 1 capsule by mouth 3 (three) times daily.   clopidogrel (PLAVIX) 75 MG tablet Take 75 mg by mouth daily.   erythromycin ophthalmic ointment Place 1 Application into both eyes at bedtime.   escitalopram (LEXAPRO) 5 MG tablet Take 5 mg by mouth daily as needed (anxiety).   esomeprazole (NEXIUM) 40 MG capsule Take 1 capsule (40 mg total) by mouth at bedtime. (Patient taking differently: Take 40  mg by mouth at bedtime as needed.)   fluticasone (FLONASE) 50 MCG/ACT nasal spray Place 2 sprays into both nostrils daily.   furosemide (LASIX) 40 MG tablet Take 20 mg by mouth as needed for fluid or edema (shortness of breath).   gabapentin (NEURONTIN) 100 MG capsule Take 1 capsule (100 mg total) by mouth at bedtime.   levocetirizine (XYZAL) 5 MG tablet Take 1 tablet (5 mg total) by mouth every evening.   metoprolol tartrate (LOPRESSOR) 25 MG tablet Take 1 tablet (25 mg total) by mouth daily.   nitroGLYCERIN (NITROSTAT) 0.4 MG SL tablet Place 1 tablet (0.4 mg total) under the tongue every 5 (five) minutes x 3 doses as needed for chest pain. If chest pain is not relieved after 2nd dose - call 911.   pravastatin (PRAVACHOL) 20 MG tablet Take 1 tablet (20 mg total) by mouth daily.   sacubitril-valsartan (ENTRESTO) 24-26 MG Take 1 tablet by mouth daily.     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  Tobacco Use   Smoking status: Never    Passive exposure: Never   Smokeless tobacco: Never  Vaping Use   Vaping Use: Never used  Substance and Sexual Activity   Alcohol use: No   Drug use: No   Sexual activity: Not Currently  Other Topics Concern   Not on file  Social History Narrative   Divorced 2016   Caffeine use: none       Social Determinants of Health   Financial Resource Strain: Low Risk  (09/30/2021)   Overall Financial Resource Strain (CARDIA)    Difficulty of Paying Living Expenses: Not hard at all  Food Insecurity: [place] Present (07/13/2022)   Hunger Vital Sign    Worried About [place] in the Last Year: Sometimes true    Ran Out of  Food in the Last Year: Sometimes true  Transportation Needs: No Transportation Needs (06/03/2022)   PRAPARE - Hydrologist (Medical): No    Lack of Transportation (Non-Medical): No  Physical Activity: Inactive (09/30/2021)   Exercise Vital Sign    Days of Exercise per Week: 0 days    Minutes of Exercise per Session: 0 min  Stress: Stress Concern Present (09/30/2021)   [place]    Feeling of Stress : Rather much  Social Connections: Moderately Isolated (09/30/2021)   Social Connection and Isolation Panel [NHANES]  Frequency of Communication with Friends and Family: More than three times a week    Frequency of Social Gatherings with Friends and Family: More than three times a week    Attends Religious Services: More than 4 times per year    Active Member of [employer] or Organizations: No    Attends Music therapist: Never    Marital Status: Divorced     Family History: The patient's family history includes Arthritis in an other family member; Coronary artery disease in an other family member; Healthy in his daughter; Heart disease in his brother, father, and mother; Hyperlipidemia in his brother, father, and another family member; Hypertension in an other family member; Stroke in his father. There is no history of Dementia. ROS:   Please see the history of present illness.    All 14 point review of systems negative except as described per history of present illness  EKGs/Labs/Other Studies Reviewed:      Recent Labs: 10/22/2021: B Natriuretic Peptide 253.8 03/31/2022: ALT 16; Hemoglobin 15.3; Platelets 199; TSH 1.910 07/01/2022: BUN 14; Creatinine, Ser 0.98; NT-Pro BNP 293; Potassium 4.6; Sodium 141  Recent Lipid Panel    Component Value Date/Time   CHOL 163 03/31/2022 1708   TRIG 214 (H) 03/31/2022 1708   TRIG 107 05/09/2010 0000   HDL 36 (L) 03/31/2022 1708   CHOLHDL 4.5  03/31/2022 1708   CHOLHDL 3.6 05/20/2015 1410   VLDL 27 05/20/2015 1410   LDLCALC 91 03/31/2022 1708    Physical Exam:    VS:  BP 110/60 (BP Location: Right Arm, Patient Position: Sitting, Cuff Size: Normal)   Pulse 85   Ht 5\' 6"  (1.676 m)   Wt 205 lb (93 kg)   SpO2 98%   BMI 33.09 kg/m     Wt Readings from Last 3 Encounters:  10/20/22 205 lb (93 kg)  10/18/22 203 lb 6.4 oz (92.3 kg)  08/31/22 205 lb 2 oz (93 kg)     GEN:  Well nourished, well developed in no acute distress HEENT: Normal NECK: No JVD; No carotid bruits LYMPHATICS: No lymphadenopathy CARDIAC: RRR, no murmurs, no rubs, no gallops RESPIRATORY:  Clear to auscultation without rales, wheezing or rhonchi  ABDOMEN: Soft, non-tender, non-distended MUSCULOSKELETAL:  No edema; No deformity  SKIN: Warm and dry LOWER EXTREMITIES: no swelling NEUROLOGIC:  Alert and oriented x 3 PSYCHIATRIC:  Normal affect   ASSESSMENT:    1. Coronary artery disease involving native coronary artery of native heart with angina pectoris ([place])   2. Chronic coronary artery disease   3. Chronic systolic (congestive) heart failure (HCC)   4. Parkinson's disease, unspecified whether dyskinesia present, unspecified whether manifestations fluctuate   5. 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 Chronic coronary disease stable from that point review denies have any chest pain tightness squeezing pressure burning chest. Status post coronary artery bypass graft.  Noted. Parking son noted follow-up by antimedicine team and neurology   Medication Adjustments/Labs and Tests Ordered: Current medicines are reviewed at length with the patient today.  Concerns regarding medicines are outlined above.  No orders of the defined types were  placed in this encounter.  Medication changes: No orders of the defined types were placed in this encounter.   Signed, Park Liter,  MD, [facility] 10/20/2022 3:07 PM    [facility]

## 2022-10-20 NOTE — Patient Instructions (Signed)
Medication Instructions:  Your physician recommends that you continue on your current medications as directed. Please refer to the Current Medication list given to you today.  *If you need a refill on your cardiac medications before your next appointment, please call your pharmacy*   Lab Work: BMP today If you have labs (blood work) drawn today and your tests are completely normal, you will receive your results only by: MyChart Message (if you have MyChart) OR A paper copy in the mail If you have any lab test that is abnormal or we need to change your treatment, we will call you to review the results.   Testing/Procedures: None Ordered   Follow-Up: At [facility], you and your health needs are our priority.  As part of our continuing mission to provide you with exceptional heart care, we have created designated Provider Care Teams.  These Care Teams include your primary Cardiologist (physician) and Advanced Practice Providers (APPs -  Physician Assistants and Nurse Practitioners) who all work together to provide you with the care you need, when you need it.  We recommend signing up for the patient portal called "MyChart".  Sign up information is provided on this After Visit Summary.  MyChart is used to connect with patients for Virtual Visits (Telemedicine).  Patients are able to view lab/test results, encounter notes, upcoming appointments, etc.  Non-urgent messages can be sent to your provider as well.   To learn more about what you can do with MyChart, go to [email or web address].    Your next appointment:   3 month(s)  The format for your next appointment:   In Person  Provider:   Robert Krasowski, MD    Other Instructions NA  

## 2022-10-21 ENCOUNTER — Telehealth: Payer: Self-pay

## 2022-10-21 LAB — BASIC METABOLIC PANEL
BUN/Creatinine Ratio: 16 (ref 10–24)
BUN: 15 mg/dL (ref 8–27)
CO2: 22 mmol/L (ref 20–29)
Calcium: 9.4 mg/dL (ref 8.6–10.2)
Chloride: 104 mmol/L (ref 96–106)
Creatinine, Ser: 0.96 mg/dL (ref 0.76–1.27)
Glucose: 107 mg/dL — ABNORMAL HIGH (ref 70–99)
Potassium: 4.1 mmol/L (ref 3.5–5.2)
Sodium: 142 mmol/L (ref 134–144)
eGFR: 81 mL/min/{1.73_m2} (ref >59)

## 2022-10-21 MED ORDER — ENTRESTO 24-26 MG PO TABS: 1.0000 | ORAL_TABLET | Freq: Two times a day (BID) | ORAL | 6 refills | Status: DC

## 2022-10-21 NOTE — Telephone Encounter (Signed)
Patient is calling back with questions about the medication. Please advise

## 2022-10-21 NOTE — Telephone Encounter (Signed)
Spoke with pt about lab results. He will start Entresto 24-26 1 twice daily and follow up with BMP in 1 week. He verbalized understanding and had no further questions.

## 2022-10-24 ENCOUNTER — Telehealth: Payer: Self-pay | Admitting: Cardiology

## 2022-10-24 NOTE — Telephone Encounter (Signed)
Pt c/o medication issue:  1. Name of Medication: sacubitril-valsartan (ENTRESTO) 24-26 MG   2. How are you currently taking this medication (dosage and times per day)?  Take 1 tablet by mouth 2 (two) times daily.   3. Are you having a reaction (difficulty breathing--STAT)? no  4. What is your medication issue? Patient called stating this medication was suppose to change to something different.  He states the only thing that changed was instead of taking one tablet daily it went to two tablet daily.  He wants to make sure this is correct.

## 2022-10-24 NOTE — Telephone Encounter (Signed)
Spoke with pt who states that when he came in and saw Dr. Agustin Cree he thought that he was going to change his medication but after the labs her was told to take his Entresto 24-26 mg twice daily rather that once daily. Pt states he wants to make sure that is correct because it would have been more clear if he had been told his dose or frequently was changing as he thought it was a new medication. Please clarify the he was supposed to increase his Entresto 24-26 mg to BID.

## 2022-10-25 NOTE — Telephone Encounter (Signed)
Recommendations reviewed with pt as per Dr. Krasowski's note.  Pt verbalized understanding and had no additional questions.   

## 2022-10-26 ENCOUNTER — Ambulatory Visit (INDEPENDENT_AMBULATORY_CARE_PROVIDER_SITE_OTHER): Payer: 59 | Admitting: Psychology

## 2022-10-26 DIAGNOSIS — F331 Major depressive disorder, recurrent, moderate: Secondary | ICD-10-CM
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
## 2022-11-02 ENCOUNTER — Telehealth: Payer: Self-pay | Admitting: Pharmacist

## 2022-11-02 DIAGNOSIS — J309 Allergic rhinitis, unspecified: Secondary | ICD-10-CM

## 2022-11-02 MED ORDER — PRAVASTATIN SODIUM 20 MG PO TABS: 20.0000 mg | ORAL_TABLET | Freq: Every day | ORAL | 1 refills | Status: DC

## 2022-11-02 MED ORDER — LEVOCETIRIZINE DIHYDROCHLORIDE 5 MG PO TABS: 5.0000 mg | ORAL_TABLET | Freq: Every evening | ORAL | 1 refills | Status: DC

## 2022-11-02 NOTE — Telephone Encounter (Signed)
Following patient adherence for 2024.  Noted that he was past due to refill pravastatin and metoprolol.  Reviewed and updated medication list.   Patient asked about medications that could help with runny nose. Looks like he is past due to refill levocetirizine.  Patient asked for updated Rx for levocetirizine 90 day supply. Sent to [facility].   Also requested RF for pravastatin and metoprolol on patient's approval.   Discussed recent change in Entresto direction to take 1 tablet twice a day. Patient states he just started this new dose about 1 week ago.  Reminded him that Dr Agustin Cree recommended recheck BMP in 1 week after dose change.

## 2022-11-09 ENCOUNTER — Ambulatory Visit (INDEPENDENT_AMBULATORY_CARE_PROVIDER_SITE_OTHER): Payer: 59 | Admitting: Psychology

## 2022-11-09 DIAGNOSIS — F331 Major depressive disorder, recurrent, moderate: Secondary | ICD-10-CM | POA: Diagnosis not present
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
## 2022-11-11 ENCOUNTER — Other Ambulatory Visit: Payer: Self-pay

## 2022-11-11 DIAGNOSIS — I110 Hypertensive heart disease with heart failure: Secondary | ICD-10-CM

## 2022-11-12 LAB — BASIC METABOLIC PANEL
BUN/Creatinine Ratio: 14 (ref 10–24)
BUN: 16 mg/dL (ref 8–27)
CO2: 19 mmol/L — ABNORMAL LOW (ref 20–29)
Calcium: 9.5 mg/dL (ref 8.6–10.2)
Chloride: 104 mmol/L (ref 96–106)
Creatinine, Ser: 1.12 mg/dL (ref 0.76–1.27)
Glucose: 120 mg/dL — ABNORMAL HIGH (ref 70–99)
Potassium: 4.2 mmol/L (ref 3.5–5.2)
Sodium: 142 mmol/L (ref 134–144)
eGFR: 67 mL/min/{1.73_m2} (ref >59)

## 2022-11-14 ENCOUNTER — Encounter: Payer: Self-pay | Admitting: *Deleted

## 2022-11-16 ENCOUNTER — Telehealth: Payer: Self-pay

## 2022-11-16 NOTE — Telephone Encounter (Signed)
Patient notified through my chart. I also asked if he was on his Entresto, awaiting response

## 2022-11-16 NOTE — Telephone Encounter (Signed)
-----   Message from Georgeanna Lea, MD sent at 11/16/2022  8:46 AM EDT ----- Chem-7 looks good, continue present management make sure he is taking Entresto 24-26

## 2022-11-23 ENCOUNTER — Ambulatory Visit (INDEPENDENT_AMBULATORY_CARE_PROVIDER_SITE_OTHER): Payer: 59 | Admitting: Psychology

## 2022-11-23 DIAGNOSIS — F331 Major depressive disorder, recurrent, moderate: Secondary | ICD-10-CM
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
## 2022-11-28 ENCOUNTER — Encounter: Payer: Self-pay | Admitting: Family Medicine

## 2022-11-28 ENCOUNTER — Ambulatory Visit (INDEPENDENT_AMBULATORY_CARE_PROVIDER_SITE_OTHER): Payer: 59 | Admitting: Family Medicine

## 2022-11-28 VITALS — BP 130/64 | HR 84 | Temp 98.1°F | Ht 66.0 in | Wt 203.1 lb

## 2022-11-28 DIAGNOSIS — Z0001 Encounter for general adult medical examination with abnormal findings: Secondary | ICD-10-CM | POA: Diagnosis not present

## 2022-11-28 DIAGNOSIS — R739 Hyperglycemia, unspecified: Secondary | ICD-10-CM

## 2022-11-28 DIAGNOSIS — I10 Essential (primary) hypertension: Secondary | ICD-10-CM | POA: Diagnosis not present

## 2022-11-28 DIAGNOSIS — F411 Generalized anxiety disorder: Secondary | ICD-10-CM | POA: Diagnosis not present

## 2022-11-28 DIAGNOSIS — Z1159 Encounter for screening for other viral diseases: Secondary | ICD-10-CM

## 2022-11-28 DIAGNOSIS — J3489 Other specified disorders of nose and nasal sinuses: Secondary | ICD-10-CM

## 2022-11-28 DIAGNOSIS — H547 Unspecified visual loss: Secondary | ICD-10-CM | POA: Diagnosis not present

## 2022-11-28 DIAGNOSIS — Z0000 Encounter for general adult medical examination without abnormal findings: Secondary | ICD-10-CM

## 2022-11-28 MED ORDER — MONTELUKAST SODIUM 10 MG PO TABS
10.0000 mg | ORAL_TABLET | Freq: Every day | ORAL | 3 refills | Status: DC
Start: 2022-11-28 — End: 2023-07-10

## 2022-11-28 MED ORDER — CITALOPRAM HYDROBROMIDE 10 MG PO TABS
10.0000 mg | ORAL_TABLET | Freq: Every day | ORAL | 3 refills | Status: DC
Start: 2022-11-28 — End: 2022-12-28

## 2022-11-28 NOTE — Progress Notes (Signed)
Chief Complaint  Patient presents with   Annual Exam    Patient feels like his health has gone down hill over the last couple months    Well Male Shawn Meza is here for a complete physical.   His last physical was >1 year ago.  Current diet: in general, a "pretty good" diet.   Current exercise: walking, active in yard Weight trend: stable Fatigue out of ordinary? Yes, particularly with exertion Seat belt? Not always.   Advanced directive? No  Health maintenance Shingrix- No Tetanus- Due Hep C- No Pneumonia vaccine- Yes  Over the last 2 months, the patient has had increased drainage from his nose and gurgling in his chest.  He is not having any coughing, swelling, weight gain, and he reports compliance with his medications from the cardiology team.  He denies any wheezing, shortness of breath.  Diet is largely unchanged.  He is compliant with his steroid nasal spray and oral antihistamine.  Patient has a history of anxiety.  He follows with a counselor routinely.  His main issue is trouble sleeping.  No homicidal or suicidal ideation.  No self-medication.  He did not do well with fluoxetine historically.  He is not on anything currently.  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   Diarrhea 11/04/2020   Elevated PSA    Erectile dysfunction    Essential hypertension    Essential tremor    GERD (gastroesophageal reflux disease)    Headache(784.0)    Hiatal hernia    Hypercholesterolemia    Long-term use of aspirin therapy    Low back pain 10/28/2019   Major depression, chronic    Medial meniscus tear 10/11/2011   Metabolic syndrome    Morbid obesity (HCC)    Myofascial pain 12/20/2019   Nephrolithiasis    hx of   NSTEMI (non-ST elevated myocardial infarction) (HCC)    Parkinson's disease    S/P CABG (coronary artery  bypass graft)    Transient ischemic attack    hx of   Trochanteric bursitis of right hip      Past Surgical History:  Procedure Laterality Date   CARDIAC CATHETERIZATION  5/12,1/13   4 stents placed   COLON SURGERY     CORONARY ARTERY BYPASS GRAFT     KNEE ARTHROSCOPY  10/11/2011   Procedure: ARTHROSCOPY KNEE;  Surgeon: Nilda Simmer, MD;  Location: [facility];  Service: Orthopedics;  Laterality: Left;  Left Knee Arthroscopy with Medial and Lateral Partial Menisectomy, Chondroplasty   LEFT HEART CATHETERIZATION WITH CORONARY ANGIOGRAM N/A 08/25/2011   Procedure: LEFT HEART CATHETERIZATION WITH CORONARY ANGIOGRAM;  Surgeon: Kathleene Hazel, MD;  Location: [facility];  Service: Cardiovascular;  Laterality: N/A;   LITHOTRIPSY     STERIOD INJECTION  10/11/2011   Procedure: STEROID INJECTION;  Surgeon: Nilda Simmer, MD;  Location: [facility];  Service: Orthopedics;  Laterality: Right;  Steroid Injection Second Toe   TRANSURETHRAL RESECTION OF PROSTATE     URETHRAL DILATION      Medications  Current Outpatient Medications on File Prior to Visit  Medication Sig Dispense Refill   acetaminophen (TYLENOL) 325 MG tablet Take 162.5 mg by mouth every 6 (six) hours as needed for mild pain or moderate pain.     aspirin 81 MG  EC tablet Take 1 tablet (81 mg total) by mouth daily. 90 tablet 3   beclomethasone (QVAR REDIHALER) 40 MCG/ACT inhaler Inhale 2 puffs into the lungs 2 (two) times daily. 1 each 2   buPROPion (WELLBUTRIN) 75 MG tablet Take 37.5 mg by mouth daily.     Carbidopa-Levodopa ER (RYTARY) 61.25-245 MG CPCR Take 1 capsule by mouth 3 (three) times daily. 270 capsule 1   esomeprazole (NEXIUM) 40 MG capsule Take 1 capsule (40 mg total) by mouth at bedtime. (Patient taking differently: Take 40 mg by mouth at bedtime as needed.) 90 capsule 3   fluticasone (FLONASE) 50 MCG/ACT nasal spray Place 2 sprays into both nostrils daily. 16 g 6   furosemide  (LASIX) 40 MG tablet Take 20 mg by mouth as needed for fluid or edema (shortness of breath).     gabapentin (NEURONTIN) 100 MG capsule Take 1 capsule (100 mg total) by mouth at bedtime. 180 capsule 1   levocetirizine (XYZAL) 5 MG tablet Take 1 tablet (5 mg total) by mouth every evening. For allergies 100 tablet 1   metoprolol tartrate (LOPRESSOR) 25 MG tablet Take 1 tablet (25 mg total) by mouth daily. 90 tablet 2   nitroGLYCERIN (NITROSTAT) 0.4 MG SL tablet Place 1 tablet (0.4 mg total) under the tongue every 5 (five) minutes x 3 doses as needed for chest pain. If chest pain is not relieved after 2nd dose - call 911. 25 tablet 1   pravastatin (PRAVACHOL) 20 MG tablet Take 1 tablet (20 mg total) by mouth daily. 90 tablet 1   sacubitril-valsartan (ENTRESTO) 24-26 MG Take 1 tablet by mouth 2 (two) times daily. 60 tablet 6   Allergies Allergies  Allergen Reactions   Tizanidine Hcl Hives   Fluoxetine Other (See Comments)    Caused depression and aggression   Rosuvastatin Other (See Comments)    Whole body aches   Testosterone Other (See Comments)    ABDOMINAL PAIN and cramping   Requip [Ropinirole] Nausea Only    Family History Family History  Problem Relation Age of Onset   Heart disease Mother    Heart disease Father    Hyperlipidemia Father    Stroke Father    Hyperlipidemia Brother    Heart disease Brother    Coronary artery disease Other        family hx of male 1st degree relative ,64   Hyperlipidemia Other        family hx of   Hypertension Other        family hx of   Arthritis Other        family hx of   Healthy Daughter    Dementia Neg Hx     Review of Systems: Constitutional:  no fevers Eye: Steadily worsening vision Ears:  No changes in hearing Nose/Mouth/Throat:  + Nasal drainage Cardiovascular: no chest pain Respiratory:  No shortness of breath Gastrointestinal:  No change in bowel habits GU:  No frequency Integumentary:  no abnormal skin lesions  reported Neurologic:  no headaches Endocrine:  denies unexplained weight changes  Exam BP 130/64 (BP Location: Left Arm, Cuff Size: Normal)   Pulse 84   Temp 98.1 F (36.7 C) (Oral)   Ht 5\' 6"  (1.676 m)   Wt 203 lb 2 oz (92.1 kg)   SpO2 93%   BMI 32.79 kg/m  General:  well developed, well nourished, in no apparent distress Skin:  no significant moles, warts, or growths Head:  no masses, lesions, or  tenderness Eyes:  pupils equal and round, sclera anicteric without injection Ears:  canals without lesions, TMs shiny without retraction, no obvious effusion, no erythema Nose:  nares patent, mucosa normal Throat/Pharynx:  lips and gingiva without lesion; tongue and uvula midline; non-inflamed pharynx; no exudates or postnasal drainage Lungs:  clear to auscultation, breath sounds equal bilaterally, no respiratory distress Cardio:  regular rate and rhythm, no LE edema or bruits Rectal: Deferred GI: BS+, S, NT, ND, no masses or organomegaly Musculoskeletal:  symmetrical muscle groups noted without atrophy or deformity Neuro:  gait slow and cautious; resting tremor both upper extremities noted; deep tendon reflexes normal and symmetric Psych: well oriented with normal range of affect and appropriate judgment/insight  Assessment and Plan  Well adult exam  Essential hypertension - Plan: CBC, Comprehensive metabolic panel, Lipid panel  Hyperglycemia - Plan: Hemoglobin A1c  Encounter for hepatitis C screening test for low risk patient - Plan: Hepatitis C antibody  GAD (generalized anxiety disorder) - Plan: citalopram (CELEXA) 10 MG tablet  Drainage from nose - Plan: montelukast (SINGULAIR) 10 MG tablet  Worsening vision - Plan: Ambulatory referral to Optometry   Well 77 y.o. male. Counseled on diet and exercise. Advanced directive form provided today.  Shingrix and Tdap rec'd.  Hep c screening today.  Drainage: Continue Xyzal 5 mg daily, Flonase daily, add montelukast 10 mg  daily.  Follow-up in 1 month.  Doubt it is related to his heart given lack of weight change, swelling, and clear lungs today. GAD: Add Celexa 10 mg/d. Cont w counseling.  Fatigue likely be much better once he starts sleeping more.  Follow up in 1 mo.  The patient voiced understanding and agreement to the plan.  Jilda Roche Bridger, DO 11/28/22 4:01 PM

## 2022-11-28 NOTE — Patient Instructions (Addendum)
Give [place] 2-3 business days to get the results of your labs back.   Keep the diet clean and stay active.  Wear your seatbelt.  Please get me a copy of your advanced directive form at your convenience.   The Shingrix vaccine (for shingles) is a 2 shot series spaced 2-6 months apart. It can make people feel low energy, achy and almost like they have the flu for 48 hours after injection. 1/5 people can have nausea and/or vomiting. Please plan accordingly when deciding on when to get this shot. Call your pharmacy to get this. The second shot of the series is less severe regarding the side effects, but it still lasts 48 hours.   Please consider getting your tetanus booster at the pharmacy.   Let us know if you need anything.

## 2022-11-29 LAB — COMPREHENSIVE METABOLIC PANEL
ALT: 11 U/L (ref 0–53)
AST: 18 U/L (ref 0–37)
Albumin: 4.2 g/dL (ref 3.5–5.2)
Alkaline Phosphatase: 62 U/L (ref 39–117)
BUN: 20 mg/dL (ref 6–23)
CO2: 25 mEq/L (ref 19–32)
Calcium: 8.9 mg/dL (ref 8.4–10.5)
Chloride: 104 mEq/L (ref 96–112)
Creatinine, Ser: 1.13 mg/dL (ref 0.40–1.50)
GFR: 62.18 mL/min (ref >60.00)
Glucose, Bld: 106 mg/dL — ABNORMAL HIGH (ref 70–99)
Potassium: 4.2 mEq/L (ref 3.5–5.1)
Sodium: 138 mEq/L (ref 135–145)
Total Bilirubin: 0.7 mg/dL (ref 0.2–1.2)
Total Protein: 6.8 g/dL (ref 6.0–8.3)

## 2022-11-29 LAB — CBC
HCT: 47.9 % (ref 39.0–52.0)
Hemoglobin: 16.3 g/dL (ref 13.0–17.0)
MCHC: 34.1 g/dL (ref 30.0–36.0)
MCV: 95.1 fl (ref 78.0–100.0)
Platelets: 206 10*3/uL (ref 150.0–400.0)
RBC: 5.03 Mil/uL (ref 4.22–5.81)
RDW: 13.3 % (ref 11.5–15.5)
WBC: 9.3 10*3/uL (ref 4.0–10.5)

## 2022-11-29 LAB — LIPID PANEL
Cholesterol: 166 mg/dL (ref 0–200)
HDL: 36.7 mg/dL — ABNORMAL LOW (ref >39.00)
LDL Cholesterol: 100 mg/dL — ABNORMAL HIGH (ref 0–99)
NonHDL: 129.22
Total CHOL/HDL Ratio: 5
Triglycerides: 144 mg/dL (ref 0.0–149.0)
VLDL: 28.8 mg/dL (ref 0.0–40.0)

## 2022-11-29 LAB — HEMOGLOBIN A1C: Hgb A1c MFr Bld: 5.8 % (ref 4.6–6.5)

## 2022-11-29 LAB — HEPATITIS C ANTIBODY: Hepatitis C Ab: NONREACTIVE

## 2022-12-07 ENCOUNTER — Ambulatory Visit (INDEPENDENT_AMBULATORY_CARE_PROVIDER_SITE_OTHER): Payer: 59 | Admitting: Psychology

## 2022-12-07 DIAGNOSIS — F331 Major depressive disorder, recurrent, moderate: Secondary | ICD-10-CM
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
## 2022-12-21 ENCOUNTER — Ambulatory Visit: Payer: 59 | Admitting: Psychology

## 2022-12-21 ENCOUNTER — Ambulatory Visit (INDEPENDENT_AMBULATORY_CARE_PROVIDER_SITE_OTHER): Payer: 59 | Admitting: Psychology

## 2022-12-21 DIAGNOSIS — F331 Major depressive disorder, recurrent, moderate: Secondary | ICD-10-CM | POA: Diagnosis not present
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
## 2022-12-22 ENCOUNTER — Ambulatory Visit: Payer: 59 | Admitting: Psychology

## 2022-12-28 ENCOUNTER — Ambulatory Visit (INDEPENDENT_AMBULATORY_CARE_PROVIDER_SITE_OTHER): Payer: 59 | Admitting: Family Medicine

## 2022-12-28 ENCOUNTER — Encounter: Payer: Self-pay | Admitting: Family Medicine

## 2022-12-28 ENCOUNTER — Ambulatory Visit (INDEPENDENT_AMBULATORY_CARE_PROVIDER_SITE_OTHER): Payer: 59 | Admitting: Pharmacist

## 2022-12-28 VITALS — BP 136/80 | HR 84 | Temp 98.6°F | Ht 66.0 in | Wt 201.2 lb

## 2022-12-28 DIAGNOSIS — J3489 Other specified disorders of nose and nasal sinuses: Secondary | ICD-10-CM | POA: Diagnosis not present

## 2022-12-28 DIAGNOSIS — F411 Generalized anxiety disorder: Secondary | ICD-10-CM

## 2022-12-28 DIAGNOSIS — R5381 Other malaise: Secondary | ICD-10-CM | POA: Diagnosis not present

## 2022-12-28 DIAGNOSIS — I255 Ischemic cardiomyopathy: Secondary | ICD-10-CM

## 2022-12-28 DIAGNOSIS — Z79899 Other long term (current) drug therapy: Secondary | ICD-10-CM

## 2022-12-28 MED ORDER — CITALOPRAM HYDROBROMIDE 20 MG PO TABS
20.0000 mg | ORAL_TABLET | Freq: Every day | ORAL | 3 refills | Status: DC
Start: 2022-12-28 — End: 2023-02-06

## 2022-12-28 NOTE — Patient Instructions (Addendum)
Keep the diet clean and stay active.  Eye doc: [phone]   Let us know if you need anything.

## 2022-12-28 NOTE — Progress Notes (Signed)
Chief Complaint  Patient presents with   Follow-up   Asthma    Subjective: Patient is a 77 y.o. male here for f/u.  Pt was tx'd for allergies w Singulair 10 mg/d 1 mo ago adding it to his INCS and Xyzal. Helped quite well with the drainage. No AE's other than insomnia which he has fixed due to rearranging the times he takes his medications.   GAD- started Celexa 10 mg/d. Reports some positive improvement. No SI or HI. No self medication. Is following with a Veterinary surgeon.   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   Diarrhea 11/04/2020   Elevated PSA    Erectile dysfunction    Essential hypertension    Essential tremor    GERD (gastroesophageal reflux disease)    Headache(784.0)    Hiatal hernia    Hypercholesterolemia    Long-term use of aspirin therapy    Low back pain 10/28/2019   Major depression, chronic    Medial meniscus tear 10/11/2011   Metabolic syndrome    Morbid obesity (HCC)    Myofascial pain 12/20/2019   Nephrolithiasis    hx of   NSTEMI (non-ST elevated myocardial infarction) (HCC)    Parkinson's disease    S/P CABG (coronary artery bypass graft)    Transient ischemic attack    hx of   Trochanteric bursitis of right hip     Objective: BP 136/80 (BP Location: Left Arm, Patient Position: Sitting, Cuff Size: Normal)   Pulse 84   Temp 98.6 F (37 C) (Oral)   Ht 5\' 6"  (1.676 m)   Wt 201 lb 4 oz (91.3 kg)   SpO2 93%   BMI 32.48 kg/m  General: Awake, appears stated age Nose: Nares are patent without rhinorrhea Heart: RRR, no LE edema Lungs: CTAB, no rales, wheezes or rhonchi. No accessory muscle use Psych: Age appropriate judgment and insight, normal affect and mood  Assessment and Plan: GAD (generalized anxiety disorder) - Plan: citalopram (CELEXA) 20 MG tablet  Drainage from nose  Chronic, not  fully controlled.  Increase Celexa from 10 mg daily to 20 mg daily.  Follow-up in 1 month to recheck this. Chronic, now stable.  Continue INCS, Xyzal 5 mg daily, Singulair 10 mg daily. The patient voiced understanding and agreement to the plan.  Jilda Roche Savoy, DO 12/28/22  3:16 PM

## 2022-12-28 NOTE — Progress Notes (Signed)
Pharmacy Note  12/28/2022 Name: Shawn Meza MRN: [id number] DOB: 07/04/1946  Subjective: Shawn Meza is a 77 y.o. year old male who is a primary care patient of [name], DO. Clinical Pharmacist Practitioner referral was placed to assist with medication management.    Engaged with patient by telephone for follow up visit today.  Medication Management:  Patient has been noted to take medications inconsistently and adjusts therapy himself due to suspected side effects. However adherence has improved over the last 6 months.  Patient is using weekly pill container to help with remembering to take medication.  He also asks for pharmacy to print what medication is for on the label which has been helpful.  He does report that he is only taking Entresto once a day - he tried twice a day and felt that it affected his sleep.  Depression / GAD:  Current therapy: citalopram 10mg  daily - Dr [name] has increased dose to 20mg  today in office.  Patient has not taken bupropion in a few weeks - removed from med list.   He has taken gabapentin at bedtime if needed and this has helped with sleep and nerve pain.   CHF:  Current therapy: taking furosemide 40mg  2 times per week, metoprolol tartrate 25mg  daily and Entresto once a day (tried twice a day but states it cause issues with sleep) No edema noted. Weight has been stable.   Parkinson's Disease:  Current therapy - Rytary 3 times a day (patient only taking twice a day)     Objective: Review of patient status, including review of consultants reports, laboratory and other test data, was performed as part of comprehensive.  Lab Results  Component Value Date   CREATININE 1.13 11/28/2022   CREATININE 1.12 11/11/2022   CREATININE 0.96 10/20/2022    Lab Results  Component Value Date   HGBA1C 5.8 11/28/2022       Component Value Date/Time   CHOL 166 11/28/2022 1424   CHOL 163 03/31/2022 1708   TRIG 144.0 11/28/2022 1424    TRIG 107 05/09/2010 0000   HDL 36.70 (L) 11/28/2022 1424   HDL 36 (L) 03/31/2022 1708   CHOLHDL 5 11/28/2022 1424   VLDL 28.8 11/28/2022 1424   LDLCALC 100 (H) 11/28/2022 1424   LDLCALC 91 03/31/2022 1708     Clinical ASCVD: Yes  The ASCVD Risk score (Arnett DK, et al., 2019) failed to calculate for the following reasons:   The patient has a prior MI or stroke diagnosis    BP Readings from Last 3 Encounters:  12/28/22 136/80  11/28/22 130/64  10/20/22 110/60     Allergies  Allergen Reactions   Tizanidine Hcl Hives   Fluoxetine Other (See Comments)    Caused depression and aggression   Rosuvastatin Other (See Comments)    Whole body aches   Testosterone Other (See Comments)    ABDOMINAL PAIN and cramping   Requip [Ropinirole] Nausea Only    Medications Reviewed Today     Reviewed by [name], DO (Physician) on 12/28/22 at 1517  Med List Status: <None>   Medication Order Taking? Sig Documenting Provider Last Dose Status Informant  acetaminophen (TYLENOL) 325 MG tablet [id number] Yes Take 162.5 mg by mouth every 6 (six) hours as needed for mild pain or moderate pain. [provider] Taking Active   aspirin 81 MG EC tablet [id number] Yes Take 1 tablet (81 mg total) by mouth daily. [name], DO Taking Active  beclomethasone (QVAR REDIHALER) 40 MCG/ACT inhaler [id number] Yes Inhale 2 puffs into the lungs 2 (two) times daily. [name], DO Taking Active   buPROPion [facility]) 75 MG tablet [id number] Yes Take 37.5 mg by mouth daily. [provider] Taking Active   Carbidopa-Levodopa ER (RYTARY) 61.25-245 MG CPCR [id number] Yes Take 1 capsule by mouth 3 (three) times daily. [name], DO Taking Active   citalopram (CELEXA) 20 MG tablet [id number] Yes Take 1 tablet (20 mg total) by mouth daily. [name], DO  Active   esomeprazole (NEXIUM) 40 MG capsule [id number] Yes Take 1 capsule (40 mg total) by  mouth at bedtime.  Patient taking differently: Take 40 mg by mouth at bedtime as needed.   [name], DO Taking Active   fluticasone (FLONASE) 50 MCG/ACT nasal spray [id number] Yes Place 2 sprays into both nostrils daily. [name], DO Taking Active   furosemide (LASIX) 40 MG tablet [id number] Yes Take 20 mg by mouth as needed for fluid or edema (shortness of breath). [provider] Taking Active            Med Note Tiburcio Pea, ABBIE R   Thu Oct 20, 2022  2:21 PM)    gabapentin (NEURONTIN) 100 MG capsule [id number] Yes Take 1 capsule (100 mg total) by mouth at bedtime. [name], DO Taking Active   levocetirizine (XYZAL) 5 MG tablet [id number] Yes Take 1 tablet (5 mg total) by mouth every evening. For allergies [name], DO Taking Active   metoprolol tartrate (LOPRESSOR) 25 MG tablet [id number] Yes Take 1 tablet (25 mg total) by mouth daily. Baldo Daub, MD Taking Active   montelukast (SINGULAIR) 10 MG tablet [id number] Yes Take 1 tablet (10 mg total) by mouth at bedtime. [name], DO Taking Active   nitroGLYCERIN (NITROSTAT) 0.4 MG SL tablet [id number] Yes Place 1 tablet (0.4 mg total) under the tongue every 5 (five) minutes x 3 doses as needed for chest pain. If chest pain is not relieved after 2nd dose - call 911. [name], DO Taking Active   pravastatin (PRAVACHOL) 20 MG tablet [id number] Yes Take 1 tablet (20 mg total) by mouth daily. [name], DO Taking Active   sacubitril-valsartan (ENTRESTO) 24-26 [place] [id number] Yes Take 1 tablet by mouth 2 (two) times daily. Georgeanna Lea, MD Taking Active             Patient Active Problem List   Diagnosis Date Noted   Major depressive disorder, recurrent episode, moderate (HCC) 06/30/2022   Lumbar spondylosis 06/29/2022   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   Diarrhea 11/04/2020   Rectal bleeding 11/04/2020   Trochanteric bursitis of right hip     Transient ischemic attack    NSTEMI (non-ST elevated myocardial infarction) (HCC)    Nephrolithiasis    Metabolic syndrome    Major depression, chronic    Long-term use of aspirin therapy    Hypercholesterolemia    Hiatal hernia    GERD (gastroesophageal reflux disease)    Essential tremor    Essential hypertension    Erectile dysfunction    Elevated PSA    Colon cancer (HCC)    Chronic coronary artery disease    Arthritis    Myofascial pain 12/20/2019   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   Low back pain 10/28/2019   Radiculopathy, lumbar region 10/28/2019   Fatigue 01/15/2019   Chronic systolic (congestive) heart failure (HCC) 09/18/2018   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   Aortic regurgitation 09/16/2018   Cardiomyopathy, unspecified (HCC) 06/13/2018   Abscess of right axilla 12/12/2017   BMI 33.0-33.9,adult 12/12/2017   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   Acute blood loss anemia 10/25/2017   Acute postoperative respiratory insufficiency 10/25/2017   Postoperative delirium 10/25/2017   Dyslipidemia 10/17/2017   Chest pain in adult 10/11/2017   SOB (shortness of breath) 03/31/2017   Long term current use of aspirin 03/29/2017   Parkinson's disease 01/02/2017   Memory change 07/06/2015   Depression, recurrent (HCC) 07/06/2015   Medial meniscus tear 10/11/2011   Neuroma of foot 10/11/2011   Coronary artery disease involving native coronary artery of native heart with angina pectoris (HCC) 12/28/2010   OTHER TESTICULAR HYPOFUNCTION 05/20/2010   Mixed hyperlipidemia 05/20/2010   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/20/2010   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 05/20/2010   GERD 05/20/2010   TRANSIENT ISCHEMIC ATTACK, HX OF 05/20/2010   NEPHROLITHIASIS, HX OF 05/20/2010   BENIGN PROSTATIC HYPERTROPHY, HX OF, S/P TURP 05/20/2010     Medication Assistance:  None required.  Patient affirms current coverage meets  needs. (Has [facility] and Medicaid coverage)   Assessment / Plan: CHF / hypertension:  Controlled per last BNP and patient symptoms Continue current medication therapy - metoprolol 25mg  once a day, Entresto once a day and furosemide once weekly as needed.  Requested refill for Entresto for patient  Parkinson's: Continue Rytary ER per Dr Don Perking directions.   Difficulty Sleeping: Continue gabapentin 100mg  at night for nerve pain.    Depression:  Continue with plan to increase citalopram to 20mg  daily  Medication management:  Reviewed and updated medication list Reviewed refill history and adherence.  Encouraged patient to continue to use weekly pill container to help with medication adherance.  Requested needed Rxs with patient permission - Entresto, fluticasone, QVAR, montelukast, citalopram.   Follow Up:  2 to 3 months   [name], PharmD Clinical Pharmacist [facility]  - [facility] [phone]

## 2023-01-04 ENCOUNTER — Ambulatory Visit (INDEPENDENT_AMBULATORY_CARE_PROVIDER_SITE_OTHER): Payer: 59 | Admitting: Psychology

## 2023-01-04 DIAGNOSIS — F331 Major depressive disorder, recurrent, moderate: Secondary | ICD-10-CM | POA: Diagnosis not present
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
## 2023-01-17 ENCOUNTER — Other Ambulatory Visit: Payer: Self-pay

## 2023-01-17 ENCOUNTER — Ambulatory Visit: Payer: 59 | Attending: Family Medicine

## 2023-01-17 DIAGNOSIS — Z79620 Long term (current) use of immunosuppressive biologic: Secondary | ICD-10-CM | POA: Diagnosis not present

## 2023-01-17 DIAGNOSIS — M6281 Muscle weakness (generalized): Secondary | ICD-10-CM | POA: Diagnosis not present

## 2023-01-17 DIAGNOSIS — R5381 Other malaise: Secondary | ICD-10-CM | POA: Insufficient documentation

## 2023-01-17 DIAGNOSIS — G20A1 Parkinson's disease without dyskinesia, without mention of fluctuations: Secondary | ICD-10-CM | POA: Insufficient documentation

## 2023-01-17 NOTE — Therapy (Signed)
OUTPATIENT PHYSICAL THERAPY LOWER EXTREMITY EVALUATION   Patient Name: Shawn Meza MRN: [id number] DOB:[birth year], 77 y.o., male Today's Date: 01/17/2023  END OF SESSION:  PT End of Session - 01/17/23 1233     Visit Number 1    Number of Visits 8    Date for PT Re-Evaluation 03/14/23    Authorization Type U[employer] MCR    Progress Note Due on Visit 10    PT Start Time 1130    PT Stop Time 1230    PT Time Calculation (min) 60 min    Activity Tolerance Patient tolerated treatment well    Behavior During Therapy [facility] for tasks assessed/performed             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   Diarrhea 11/04/2020   Elevated PSA    Erectile dysfunction    Essential hypertension    Essential tremor    GERD (gastroesophageal reflux disease)    Headache(784.0)    Hiatal hernia    Hypercholesterolemia    Long-term use of aspirin therapy    Low back pain 10/28/2019   Major depression, chronic    Medial meniscus tear 10/11/2011   Metabolic syndrome    Morbid obesity (HCC)    Myofascial pain 12/20/2019   Nephrolithiasis    hx of   NSTEMI (non-ST elevated myocardial infarction) (HCC)    Parkinson's disease    S/P CABG (coronary artery bypass graft)    Transient ischemic attack    hx of   Trochanteric bursitis of right hip    Past Surgical History:  Procedure Laterality Date   CARDIAC CATHETERIZATION  5/12,1/13   4 stents placed   COLON SURGERY     CORONARY ARTERY BYPASS GRAFT     KNEE ARTHROSCOPY  10/11/2011   Procedure: ARTHROSCOPY KNEE;  Surgeon: Nilda Simmer, MD;  Location: [facility];  Service: Orthopedics;  Laterality: Left;  Left Knee Arthroscopy with Medial and Lateral Partial Menisectomy, Chondroplasty   LEFT HEART CATHETERIZATION WITH CORONARY ANGIOGRAM N/A 08/25/2011   Procedure: LEFT  HEART CATHETERIZATION WITH CORONARY ANGIOGRAM;  Surgeon: Kathleene Hazel, MD;  Location: [facility] CATH LAB;  Service: Cardiovascular;  Laterality: N/A;   LITHOTRIPSY     STERIOD INJECTION  10/11/2011   Procedure: STEROID INJECTION;  Surgeon: Nilda Simmer, MD;  Location: [facility];  Service: Orthopedics;  Laterality: Right;  Steroid Injection Second Toe   TRANSURETHRAL RESECTION OF PROSTATE     URETHRAL DILATION     Patient Active Problem List   Diagnosis Date Noted   Major depressive disorder, recurrent episode, moderate (HCC) 06/30/2022   Lumbar spondylosis 06/29/2022   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   Diarrhea 11/04/2020   Rectal bleeding 11/04/2020   Trochanteric bursitis of right hip    Transient ischemic attack    NSTEMI (non-ST elevated myocardial infarction) (HCC)    Nephrolithiasis    Metabolic syndrome    Major depression, chronic    Long-term use of aspirin therapy    Hypercholesterolemia    Hiatal hernia    GERD (gastroesophageal reflux disease)    Essential tremor    Essential hypertension    Erectile dysfunction    Elevated PSA    Colon cancer (HCC)    Chronic coronary artery disease  Arthritis    Myofascial pain 12/20/2019   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   Low back pain 10/28/2019   Radiculopathy, lumbar region 10/28/2019   Fatigue 01/15/2019   Chronic systolic (congestive) heart failure (HCC) 09/18/2018   Ischemic cardiomyopathy 09/16/2018   Aortic regurgitation 09/16/2018   Cardiomyopathy, unspecified (HCC) 06/13/2018   Abscess of right axilla 12/12/2017   BMI 33.0-33.9,adult 12/12/2017   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   Acute blood loss anemia 10/25/2017   Acute postoperative respiratory insufficiency 10/25/2017   Postoperative delirium 10/25/2017   Dyslipidemia 10/17/2017   Chest pain in adult 10/11/2017   SOB (shortness of breath) 03/31/2017   Long term current use of aspirin 03/29/2017    Parkinson's disease 01/02/2017   Memory change 07/06/2015   Depression, recurrent (HCC) 07/06/2015   Medial meniscus tear 10/11/2011   Neuroma of foot 10/11/2011   Coronary artery disease involving native coronary artery of native heart with angina pectoris (HCC) 12/28/2010   OTHER TESTICULAR HYPOFUNCTION 05/20/2010   Mixed hyperlipidemia 05/20/2010   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/20/2010   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 05/20/2010   GERD 05/20/2010   TRANSIENT ISCHEMIC ATTACK, HX OF 05/20/2010   NEPHROLITHIASIS, HX OF 05/20/2010   BENIGN PROSTATIC HYPERTROPHY, HX OF, S/P TURP 05/20/2010    PCP: [name], DO   REFERRING PROVIDER:  [name], DO REFERRING DIAG: [phone] (ICD-10-CM) - Physical deconditioning  THERAPY DIAG:  Muscle weakness (generalized) - Plan: PT plan of care cert/re-cert  Physical deconditioning - Plan: PT plan of care cert/re-cert  Parkinson's disease, unspecified whether dyskinesia present, unspecified whether manifestations fluctuate  Rationale for Evaluation and Treatment: Rehabilitation  ONSET DATE: chronic  SUBJECTIVE:   SUBJECTIVE STATEMENT: Describes a history of decreased activity tolerance related to a combination of Parkinson's, general deconditioning and depression and anxiety.  He admits he has not been compliant with previous PT and HEPs.  He relies on his lift chair for transfers and lives alone with little family support.  PERTINENT HISTORY: parkinsons PAIN:  Are you having pain? Yes: NPRS scale: 6/10 Pain location: low back Pain description: ache Aggravating factors: position changes Relieving factors: position changes  PRECAUTIONS: Fall  WEIGHT BEARING RESTRICTIONS: No  FALLS:  Has patient fallen in last 6 months? No  OCCUPATION: retired  PLOF: Independent  PATIENT GOALS: To establish a HEP and walk 174ft to my trash cans  NEXT MD VISIT: PRN  OBJECTIVE:    DIAGNOSTIC FINDINGS: none  PATIENT SURVEYS:  FOTO 30(40 predicted)  MUSCLE LENGTH: Hamstrings: Right 60 deg; Left 60 deg  POSTURE:  flexed posture  LOWER EXTREMITY ROM: WFL for gait, transfers and bed mobility  Active ROM Right eval Left eval  Hip flexion    Hip extension    Hip abduction    Hip adduction    Hip internal rotation    Hip external rotation    Knee flexion    Knee extension    Ankle dorsiflexion    Ankle plantarflexion    Ankle inversion    Ankle eversion     (Blank rows = not tested)  LOWER EXTREMITY MMT:  MMT Right eval Left eval  Hip flexion 4- 3+  Hip extension 4- 3+  Hip abduction 4- 3+  Hip adduction    Hip internal rotation    Hip external rotation    Knee flexion 4- 3+  Knee extension 4- 3+  Ankle dorsiflexion    Ankle plantarflexion  4- 3+  Ankle inversion    Ankle eversion     (Blank rows = not tested)    FUNCTIONAL TESTS:  5 times sit to stand: N/T 30 seconds chair stand test 7 reps with UE assist  GAIT: Distance walked: 16ft x2 Assistive device utilized: None Level of assistance: Complete Independence Comments: festinating gait with slow cadence   TODAY'S TREATMENT:                                                                                                                              DATE: 01/17/23 Eval and HEP   PATIENT EDUCATION:  Education details: Discussed eval findings, rehab rationale and POC and patient is in agreement  Person educated: Patient Education method: Explanation Education comprehension: verbalized understanding and needs further education  HOME EXERCISE PROGRAM: Access Code: [email or web address] URL: [email or web address] Date: 01/17/2023 Prepared by: Gustavus Bryant  Exercises - Sit to Stand with Arms Crossed  - 2 x daily - 5 x weekly - 1 sets - 5 reps  ASSESSMENT:  CLINICAL IMPRESSION: Patient is a 77 y.o. male who was seen today for physical therapy evaluation and treatment for  general deconditioning combined with a history of Parkinson's. He relates a history of chronic low back pain which also limits activity tolerance.  He is a relatively poor historian regarding previous PT and PMHx and does not have a good understanding of his Parkinson's and cardiac disease processes.  LE weakness noted a well as global stiffness resultant of PD.  30s chair stand test and FOTO demonstrate actual and perceived functional deficits.  OBJECTIVE IMPAIRMENTS: cardiopulmonary status limiting activity, decreased activity tolerance, decreased coordination, decreased endurance, decreased knowledge of condition, decreased mobility, difficulty walking, decreased strength, impaired flexibility, postural dysfunction, pain, and Parkinsonian symptoms .   ACTIVITY LIMITATIONS: carrying, lifting, bending, squatting, stairs, and reach over head  PERSONAL FACTORS: Age, Fitness, Past/current experiences, Time since onset of injury/illness/exacerbation, and 1-2 comorbidities: Parkinsons Disease  are also affecting patient's functional outcome.   REHAB POTENTIAL: Good  CLINICAL DECISION MAKING: Evolving/moderate complexity  EVALUATION COMPLEXITY: Low   GOALS: Goals reviewed with patient? No  SHORT TERM GOALS: Target date: 03/19/23 Patient to demonstrate independence in HEP  Baseline: [id number] Goal status: INITIAL  2.  262ft ambulation w/o need of AD Baseline: 18ft with slow cadence and festinating gait Goal status: INITIAL  3.  Patient to negotiate 4 steps with handrail assist and most appropriate pattern Baseline: TBD Goal status: INITIAL  4.  Increase FOTO score to 40 Baseline: 30 Goal status: INITIAL  5.  Increase B LE strength to 4/5 Baseline:  MMT Right eval Left eval  Hip flexion 4- 3+  Hip extension 4- 3+  Hip abduction 4- 3+  Hip adduction    Hip internal rotation    Hip external rotation    Knee flexion 4- 3+  Knee extension 4- 3+  Ankle dorsiflexion    Ankle  plantarflexion 4- 3+  Goal status: INITIAL    PLAN:  PT FREQUENCY: 1-2x/week  PT DURATION: 4 weeks  PLANNED INTERVENTIONS: Therapeutic exercises, Therapeutic activity, Neuromuscular re-education, Balance training, Gait training, Patient/Family education, Self Care, Joint mobilization, Stair training, DME instructions, Dry Needling, Manual therapy, and Re-evaluation  PLAN FOR NEXT SESSION: HEP review and update, manual techniques as appropriate, aerobic tasks, ROM and flexibility activities, strengthening and PREs, TPDN, gait and balance training as needed     Hildred Laser, PT 01/17/2023, 12:34 PM

## 2023-01-18 ENCOUNTER — Ambulatory Visit (INDEPENDENT_AMBULATORY_CARE_PROVIDER_SITE_OTHER): Payer: 59 | Admitting: Psychology

## 2023-01-18 DIAGNOSIS — F331 Major depressive disorder, recurrent, moderate: Secondary | ICD-10-CM | POA: Diagnosis not present
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
## 2023-01-20 NOTE — Therapy (Unsigned)
OUTPATIENT PHYSICAL THERAPY TREATMENT NOTE   Patient Name: Shawn Meza MRN: [id number] DOB:[birth year], 77 y.o., male Today's Date: 01/23/2023  PCP: [name], DO  REFERRING PROVIDER: [name], DO   END OF SESSION:   PT End of Session - 01/23/23 1356     Visit Number 2    Number of Visits 8    Date for PT Re-Evaluation 03/14/23    Authorization Type UHC MCR    Progress Note Due on Visit 10    PT Start Time 1400    PT Stop Time 1440    PT Time Calculation (min) 40 min    Activity Tolerance Patient tolerated treatment well    Behavior During Therapy [facility] for tasks assessed/performed             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   Diarrhea 11/04/2020   Elevated PSA    Erectile dysfunction    Essential hypertension    Essential tremor    GERD (gastroesophageal reflux disease)    Headache(784.0)    Hiatal hernia    Hypercholesterolemia    Long-term use of aspirin therapy    Low back pain 10/28/2019   Major depression, chronic    Medial meniscus tear 10/11/2011   Metabolic syndrome    Morbid obesity (HCC)    Myofascial pain 12/20/2019   Nephrolithiasis    hx of   NSTEMI (non-ST elevated myocardial infarction) (HCC)    Parkinson's disease    S/P CABG (coronary artery bypass graft)    Transient ischemic attack    hx of   Trochanteric bursitis of right hip    Past Surgical History:  Procedure Laterality Date   CARDIAC CATHETERIZATION  5/12,1/13   4 stents placed   COLON SURGERY     CORONARY ARTERY BYPASS GRAFT     KNEE ARTHROSCOPY  10/11/2011   Procedure: ARTHROSCOPY KNEE;  Surgeon: Nilda Simmer, MD;  Location: [facility];  Service: Orthopedics;  Laterality: Left;  Left Knee Arthroscopy with Medial and Lateral Partial Menisectomy, Chondroplasty   LEFT HEART  CATHETERIZATION WITH CORONARY ANGIOGRAM N/A 08/25/2011   Procedure: LEFT HEART CATHETERIZATION WITH CORONARY ANGIOGRAM;  Surgeon: Kathleene Hazel, MD;  Location: [facility] CATH LAB;  Service: Cardiovascular;  Laterality: N/A;   LITHOTRIPSY     STERIOD INJECTION  10/11/2011   Procedure: STEROID INJECTION;  Surgeon: Nilda Simmer, MD;  Location: [facility];  Service: Orthopedics;  Laterality: Right;  Steroid Injection Second Toe   TRANSURETHRAL RESECTION OF PROSTATE     URETHRAL DILATION     Patient Active Problem List   Diagnosis Date Noted   Major depressive disorder, recurrent episode, moderate (HCC) 06/30/2022   Lumbar spondylosis 06/29/2022   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   Diarrhea 11/04/2020   Rectal bleeding 11/04/2020   Trochanteric bursitis of right hip    Transient ischemic attack    NSTEMI (non-ST elevated myocardial infarction) (HCC)    Nephrolithiasis    Metabolic syndrome    Major depression, chronic    Long-term use of aspirin therapy    Hypercholesterolemia    Hiatal hernia    GERD (gastroesophageal reflux disease)    Essential tremor    Essential hypertension    Erectile dysfunction    Elevated PSA  Colon cancer (HCC)    Chronic coronary artery disease    Arthritis    Myofascial pain 12/20/2019   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   Low back pain 10/28/2019   Radiculopathy, lumbar region 10/28/2019   Fatigue 01/15/2019   Chronic systolic (congestive) heart failure (HCC) 09/18/2018   Ischemic cardiomyopathy 09/16/2018   Aortic regurgitation 09/16/2018   Cardiomyopathy, unspecified (HCC) 06/13/2018   Abscess of right axilla 12/12/2017   BMI 33.0-33.9,adult 12/12/2017   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   Acute blood loss anemia 10/25/2017   Acute postoperative respiratory insufficiency 10/25/2017   Postoperative delirium 10/25/2017   Dyslipidemia 10/17/2017   Chest pain in adult 10/11/2017   SOB (shortness of  breath) 03/31/2017   Long term current use of aspirin 03/29/2017   Parkinson's disease 01/02/2017   Memory change 07/06/2015   Depression, recurrent (HCC) 07/06/2015   Medial meniscus tear 10/11/2011   Neuroma of foot 10/11/2011   Coronary artery disease involving native coronary artery of native heart with angina pectoris (HCC) 12/28/2010   OTHER TESTICULAR HYPOFUNCTION 05/20/2010   Mixed hyperlipidemia 05/20/2010   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/20/2010   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 05/20/2010   GERD 05/20/2010   TRANSIENT ISCHEMIC ATTACK, HX OF 05/20/2010   NEPHROLITHIASIS, HX OF 05/20/2010   BENIGN PROSTATIC HYPERTROPHY, HX OF, S/P TURP 05/20/2010    REFERRING DIAG: R53.81 (ICD-10-CM) - Physical deconditioning   THERAPY DIAG:  Muscle weakness (generalized)  Physical deconditioning  Parkinson's disease, unspecified whether dyskinesia present, unspecified whether manifestations fluctuate  Rationale for Evaluation and Treatment Rehabilitation  PERTINENT HISTORY: parkinsons   PRECAUTIONS: Fall   SUBJECTIVE:                                                                                                                                                                                      SUBJECTIVE STATEMENT:  Reports global soreness, worse following a trip to [place] where he was very active with walking   PAIN:  Are you having pain? No   OBJECTIVE: (objective measures completed at initial evaluation unless otherwise dated)   DIAGNOSTIC FINDINGS: none   PATIENT SURVEYS:  FOTO 30(40 predicted)   MUSCLE LENGTH: Hamstrings: Right 60 deg; Left 60 deg   POSTURE:  flexed posture   LOWER EXTREMITY ROM: WFL for gait, transfers and bed mobility   Active ROM Right eval Left eval  Hip flexion      Hip extension      Hip abduction      Hip adduction      Hip internal rotation      Hip external rotation  Knee flexion       Knee extension      Ankle dorsiflexion      Ankle plantarflexion      Ankle inversion      Ankle eversion       (Blank rows = not tested)   LOWER EXTREMITY MMT:   MMT Right eval Left eval  Hip flexion 4- 3+  Hip extension 4- 3+  Hip abduction 4- 3+  Hip adduction      Hip internal rotation      Hip external rotation      Knee flexion 4- 3+  Knee extension 4- 3+  Ankle dorsiflexion      Ankle plantarflexion 4- 3+  Ankle inversion      Ankle eversion       (Blank rows = not tested)       FUNCTIONAL TESTS:  5 times sit to stand: N/T 30 seconds chair stand test 7 reps with UE assist   GAIT: Distance walked: 44ft x2 Assistive device utilized: None Level of assistance: Complete Independence Comments: festinating gait with slow cadence     TODAY'S TREATMENT:         [facility] Adult PT Treatment:                                                DATE: 01/23/23 Therapeutic Exercise: Seated hamstring stretch on footstool 30s x2 B  Neuromuscular re-ed:   01/23/23 0001  Berg Balance Test  Sit to Stand 4  Standing Unsupported 4  Sitting with Back Unsupported but Feet Supported on Floor or Stool 4  Stand to Sit 4  Transfers 4  Standing Unsupported with Eyes Closed 4  Standing Unsupported with Feet Together 4  From Standing, Reach Forward with Outstretched Arm 4  From Standing Position, Pick up Object from Floor 4  From Standing Position, Turn to Look Behind Over each Shoulder 2  Turn 360 Degrees 2  Standing Unsupported, Alternately Place Feet on Step/Stool 4  Standing Unsupported, One Foot in Front 3  Standing on One Leg 1  Total Score 48                                                                                                                          DATE: 01/17/23 Eval and HEP     PATIENT EDUCATION:  Education details: Discussed eval findings, rehab rationale and POC and patient is in agreement  Person educated: Patient Education method:  Explanation Education comprehension: verbalized understanding and needs further education   HOME EXERCISE PROGRAM: Access Code: [email or web address] URL: [email or web address] Date: 01/17/2023 Prepared by: Gustavus Bryant   Exercises - Sit to Stand with Arms Crossed  - 2 x daily - 5 x weekly - 1 sets - 5 reps   ASSESSMENT:   CLINICAL IMPRESSION: Today's session assessed 2 MWT,  BERG balance test.  Patient scores passing marks on BBT indicating functional static balance.  Updated HEP to address stiffness and loss of motion.  No LOB during 2 MWT but some apprehension noted during turns and directional changes.  Patient is a 77 y.o. male who was seen today for physical therapy evaluation and treatment for general deconditioning combined with a history of Parkinson's. He relates a history of chronic low back pain which also limits activity tolerance.  He is a relatively poor historian regarding previous PT and PMHx and does not have a good understanding of his Parkinson's and cardiac disease processes.  LE weakness noted a well as global stiffness resultant of PD.  30s chair stand test and FOTO demonstrate actual and perceived functional deficits.   OBJECTIVE IMPAIRMENTS: cardiopulmonary status limiting activity, decreased activity tolerance, decreased coordination, decreased endurance, decreased knowledge of condition, decreased mobility, difficulty walking, decreased strength, impaired flexibility, postural dysfunction, pain, and Parkinsonian symptoms .    ACTIVITY LIMITATIONS: carrying, lifting, bending, squatting, stairs, and reach over head   PERSONAL FACTORS: Age, Fitness, Past/current experiences, Time since onset of injury/illness/exacerbation, and 1-2 comorbidities: Parkinsons Disease  are also affecting patient's functional outcome.    REHAB POTENTIAL: Good   CLINICAL DECISION MAKING: Evolving/moderate complexity   EVALUATION COMPLEXITY: Low     GOALS: Goals reviewed with  patient? No   SHORT TERM GOALS: Target date: 03/19/23 Patient to demonstrate independence in HEP  Baseline: [id number] Goal status: INITIAL   2.  258ft ambulation w/o need of AD Baseline: 65ft with slow cadence and festinating gait; 01/23/23 247ft during 2 MWT Goal status: MET   3.  Patient to negotiate 4 steps with handrail assist and most appropriate pattern Baseline: TBD Goal status: INITIAL   4.  Increase FOTO score to 40 Baseline: 30 Goal status: INITIAL   5.  Increase B LE strength to 4/5 Baseline:  MMT Right eval Left eval  Hip flexion 4- 3+  Hip extension 4- 3+  Hip abduction 4- 3+  Hip adduction      Hip internal rotation      Hip external rotation      Knee flexion 4- 3+  Knee extension 4- 3+  Ankle dorsiflexion      Ankle plantarflexion 4- 3+    Goal status: INITIAL       PLAN:   PT FREQUENCY: 1-2x/week   PT DURATION: 4 weeks   PLANNED INTERVENTIONS: Therapeutic exercises, Therapeutic activity, Neuromuscular re-education, Balance training, Gait training, Patient/Family education, Self Care, Joint mobilization, Stair training, DME instructions, Dry Needling, Manual therapy, and Re-evaluation   PLAN FOR NEXT SESSION: HEP review and update, manual techniques as appropriate, aerobic tasks, ROM and flexibility activities, strengthening and PREs, TPDN, gait and balance training as needed     Hildred Laser, PT 01/23/2023, 2:47 PM

## 2023-01-23 ENCOUNTER — Ambulatory Visit: Payer: 59

## 2023-01-23 DIAGNOSIS — G20A1 Parkinson's disease without dyskinesia, without mention of fluctuations: Secondary | ICD-10-CM

## 2023-01-23 DIAGNOSIS — R5381 Other malaise: Secondary | ICD-10-CM | POA: Diagnosis not present

## 2023-01-23 DIAGNOSIS — Z79620 Long term (current) use of immunosuppressive biologic: Secondary | ICD-10-CM | POA: Diagnosis not present

## 2023-01-23 DIAGNOSIS — M6281 Muscle weakness (generalized): Secondary | ICD-10-CM | POA: Diagnosis not present

## 2023-01-24 NOTE — Therapy (Unsigned)
OUTPATIENT PHYSICAL THERAPY TREATMENT NOTE   Patient Name: Shawn Meza MRN: [id number] DOB:03/19/1946, 77 y.o., male Today's Date: 01/23/2023  PCP: Wendling, Nicholas Paul, DO  REFERRING PROVIDER: Wendling, Nicholas Paul, DO   END OF SESSION:   PT End of Session - 01/23/23 1356     Visit Number 2    Number of Visits 8    Date for PT Re-Evaluation 03/14/23    Authorization Type [employer] MCR    Progress Note Due on Visit 10    PT Start Time 1400    PT Stop Time 1440    PT Time Calculation (min) 40 min    Activity Tolerance Patient tolerated treatment well    Behavior During Therapy WFL for tasks assessed/performed             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   Diarrhea 11/04/2020   Elevated PSA    Erectile dysfunction    Essential hypertension    Essential tremor    GERD (gastroesophageal reflux disease)    Headache(784.0)    Hiatal hernia    Hypercholesterolemia    Long-term use of aspirin therapy    Low back pain 10/28/2019   Major depression, chronic    Medial meniscus tear 10/11/2011   Metabolic syndrome    Morbid obesity (HCC)    Myofascial pain 12/20/2019   Nephrolithiasis    hx of   NSTEMI (non-ST elevated myocardial infarction) (HCC)    Parkinson's disease    S/P CABG (coronary artery bypass graft)    Transient ischemic attack    hx of   Trochanteric bursitis of right hip    Past Surgical History:  Procedure Laterality Date   CARDIAC CATHETERIZATION  5/12,1/13   4 stents placed   COLON SURGERY     CORONARY ARTERY BYPASS GRAFT     KNEE ARTHROSCOPY  10/11/2011   Procedure: ARTHROSCOPY KNEE;  Surgeon: Robert A Wainer, MD;  Location: [facility];  Service: Orthopedics;  Laterality: Left;  Left Knee Arthroscopy with Medial and Lateral Partial Menisectomy, Chondroplasty   LEFT HEART  CATHETERIZATION WITH CORONARY ANGIOGRAM N/A 08/25/2011   Procedure: LEFT HEART CATHETERIZATION WITH CORONARY ANGIOGRAM;  Surgeon: Christopher D McAlhany, MD;  Location: [facility];  Service: Cardiovascular;  Laterality: N/A;   LITHOTRIPSY     STERIOD INJECTION  10/11/2011   Procedure: STEROID INJECTION;  Surgeon: Robert A Wainer, MD;  Location: [facility];  Service: Orthopedics;  Laterality: Right;  Steroid Injection Second Toe   TRANSURETHRAL RESECTION OF PROSTATE     URETHRAL DILATION     Patient Active Problem List   Diagnosis Date Noted   Major depressive disorder, recurrent episode, moderate (HCC) 06/30/2022   Lumbar spondylosis 06/29/2022   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   Diarrhea 11/04/2020   Rectal bleeding 11/04/2020   Trochanteric bursitis of right hip    Transient ischemic attack    NSTEMI (non-ST elevated myocardial infarction) (HCC)    Nephrolithiasis    Metabolic syndrome    Major depression, chronic    Long-term use of aspirin therapy    Hypercholesterolemia    Hiatal hernia    GERD (gastroesophageal reflux disease)    Essential tremor    Essential hypertension    Erectile dysfunction    Elevated PSA      Colon cancer (HCC)    Chronic coronary artery disease    Arthritis    Myofascial pain 12/20/2019   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   Low back pain 10/28/2019   Radiculopathy, lumbar region 10/28/2019   Fatigue 01/15/2019   Chronic systolic (congestive) heart failure (HCC) 09/18/2018   Ischemic cardiomyopathy 09/16/2018   Aortic regurgitation 09/16/2018   Cardiomyopathy, unspecified (HCC) 06/13/2018   Abscess of right axilla 12/12/2017   BMI 33.0-33.9,adult 12/12/2017   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   Acute blood loss anemia 10/25/2017   Acute postoperative respiratory insufficiency 10/25/2017   Postoperative delirium 10/25/2017   Dyslipidemia 10/17/2017   Chest pain in adult 10/11/2017   SOB (shortness of  breath) 03/31/2017   Long term current use of aspirin 03/29/2017   Parkinson's disease 01/02/2017   Memory change 07/06/2015   Depression, recurrent (HCC) 07/06/2015   Medial meniscus tear 10/11/2011   Neuroma of foot 10/11/2011   Coronary artery disease involving native coronary artery of native heart with angina pectoris (HCC) 12/28/2010   OTHER TESTICULAR HYPOFUNCTION 05/20/2010   Mixed hyperlipidemia 05/20/2010   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/20/2010   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 05/20/2010   GERD 05/20/2010   TRANSIENT ISCHEMIC ATTACK, HX OF 05/20/2010   NEPHROLITHIASIS, HX OF 05/20/2010   BENIGN PROSTATIC HYPERTROPHY, HX OF, S/P TURP 05/20/2010    REFERRING DIAG: R53.81 (ICD-10-CM) - Physical deconditioning   THERAPY DIAG:  Muscle weakness (generalized)  Physical deconditioning  Parkinson's disease, unspecified whether dyskinesia present, unspecified whether manifestations fluctuate  Rationale for Evaluation and Treatment Rehabilitation  PERTINENT HISTORY: parkinsons   PRECAUTIONS: Fall   SUBJECTIVE:                                                                                                                                                                                      SUBJECTIVE STATEMENT:  Reports global soreness, worse following a trip to [place] where he was very active with walking   PAIN:  Are you having pain? No   OBJECTIVE: (objective measures completed at initial evaluation unless otherwise dated)   DIAGNOSTIC FINDINGS: none   PATIENT SURVEYS:  FOTO 30(40 predicted)   MUSCLE LENGTH: Hamstrings: Right 60 deg; Left 60 deg   POSTURE:  flexed posture   LOWER EXTREMITY ROM: WFL for gait, transfers and bed mobility   Active ROM Right eval Left eval  Hip flexion      Hip extension      Hip abduction      Hip adduction      Hip internal rotation      Hip external rotation        Knee flexion       Knee extension      Ankle dorsiflexion      Ankle plantarflexion      Ankle inversion      Ankle eversion       (Blank rows = not tested)   LOWER EXTREMITY MMT:   MMT Right eval Left eval  Hip flexion 4- 3+  Hip extension 4- 3+  Hip abduction 4- 3+  Hip adduction      Hip internal rotation      Hip external rotation      Knee flexion 4- 3+  Knee extension 4- 3+  Ankle dorsiflexion      Ankle plantarflexion 4- 3+  Ankle inversion      Ankle eversion       (Blank rows = not tested)       FUNCTIONAL TESTS:  5 times sit to stand: N/T 30 seconds chair stand test 7 reps with UE assist   GAIT: Distance walked: 75ft x2 Assistive device utilized: None Level of assistance: Complete Independence Comments: festinating gait with slow cadence     TODAY'S TREATMENT:         [facility] Adult PT Treatment:                                                DATE: 01/23/23 Therapeutic Exercise: Seated hamstring stretch on footstool 30s x2 B  Neuromuscular re-ed:   01/23/23 0001  Berg Balance Test  Sit to Stand 4  Standing Unsupported 4  Sitting with Back Unsupported but Feet Supported on Floor or Stool 4  Stand to Sit 4  Transfers 4  Standing Unsupported with Eyes Closed 4  Standing Unsupported with Feet Together 4  From Standing, Reach Forward with Outstretched Arm 4  From Standing Position, Pick up Object from Floor 4  From Standing Position, Turn to Look Behind Over each Shoulder 2  Turn 360 Degrees 2  Standing Unsupported, Alternately Place Feet on Step/Stool 4  Standing Unsupported, One Foot in Front 3  Standing on One Leg 1  Total Score 48                                                                                                                          DATE: 01/17/23 Eval and HEP     PATIENT EDUCATION:  Education details: Discussed eval findings, rehab rationale and POC and patient is in agreement  Person educated: Patient Education method:  Explanation Education comprehension: verbalized understanding and needs further education   HOME EXERCISE PROGRAM: Access Code: [email or web address] URL: [email or web address] Date: 01/17/2023 Prepared by: Tiffannie Sloss   Exercises - Sit to Stand with Arms Crossed  - 2 x daily - 5 x weekly - 1 sets - 5 reps   ASSESSMENT:   CLINICAL IMPRESSION: Today's session assessed 2 MWT,   BERG balance test.  Patient scores passing marks on BBT indicating functional static balance.  Updated HEP to address stiffness and loss of motion.  No LOB during 2 MWT but some apprehension noted during turns and directional changes.  Patient is a 76 y.o. male who was seen today for physical therapy evaluation and treatment for general deconditioning combined with a history of Parkinson's. He relates a history of chronic low back pain which also limits activity tolerance.  He is a relatively poor historian regarding previous PT and PMHx and does not have a good understanding of his Parkinson's and cardiac disease processes.  LE weakness noted a well as global stiffness resultant of PD.  30s chair stand test and FOTO demonstrate actual and perceived functional deficits.   OBJECTIVE IMPAIRMENTS: cardiopulmonary status limiting activity, decreased activity tolerance, decreased coordination, decreased endurance, decreased knowledge of condition, decreased mobility, difficulty walking, decreased strength, impaired flexibility, postural dysfunction, pain, and Parkinsonian symptoms .    ACTIVITY LIMITATIONS: carrying, lifting, bending, squatting, stairs, and reach over head   PERSONAL FACTORS: Age, Fitness, Past/current experiences, Time since onset of injury/illness/exacerbation, and 1-2 comorbidities: Parkinsons Disease  are also affecting patient's functional outcome.    REHAB POTENTIAL: Good   CLINICAL DECISION MAKING: Evolving/moderate complexity   EVALUATION COMPLEXITY: Low     GOALS: Goals reviewed with  patient? No   SHORT TERM GOALS: Target date: 03/19/23 Patient to demonstrate independence in HEP  Baseline: [email or web address] Goal status: INITIAL   2.  200ft ambulation w/o need of AD Baseline: 75ft with slow cadence and festinating gait; 01/23/23 280ft during 2 MWT Goal status: MET   3.  Patient to negotiate 4 steps with handrail assist and most appropriate pattern Baseline: TBD Goal status: INITIAL   4.  Increase FOTO score to 40 Baseline: 30 Goal status: INITIAL   5.  Increase B LE strength to 4/5 Baseline:  MMT Right eval Left eval  Hip flexion 4- 3+  Hip extension 4- 3+  Hip abduction 4- 3+  Hip adduction      Hip internal rotation      Hip external rotation      Knee flexion 4- 3+  Knee extension 4- 3+  Ankle dorsiflexion      Ankle plantarflexion 4- 3+    Goal status: INITIAL       PLAN:   PT FREQUENCY: 1-2x/week   PT DURATION: 4 weeks   PLANNED INTERVENTIONS: Therapeutic exercises, Therapeutic activity, Neuromuscular re-education, Balance training, Gait training, Patient/Family education, Self Care, Joint mobilization, Stair training, DME instructions, Dry Needling, Manual therapy, and Re-evaluation   PLAN FOR NEXT SESSION: HEP review and update, manual techniques as appropriate, aerobic tasks, ROM and flexibility activities, strengthening and PREs, TPDN, gait and balance training as needed     Carlon Davidson M Lavergne Hiltunen, PT 01/23/2023, 2:47 PM       Hildred Laser, PT 01/26/2023, 5:41 PM

## 2023-01-26 ENCOUNTER — Encounter: Payer: Self-pay | Admitting: Cardiology

## 2023-01-26 ENCOUNTER — Ambulatory Visit: Payer: 59

## 2023-01-26 ENCOUNTER — Ambulatory Visit: Payer: 59 | Attending: Cardiology | Admitting: Cardiology

## 2023-01-26 VITALS — BP 128/72 | HR 83 | Ht 67.0 in | Wt 203.6 lb

## 2023-01-26 DIAGNOSIS — Z79620 Long term (current) use of immunosuppressive biologic: Secondary | ICD-10-CM | POA: Diagnosis not present

## 2023-01-26 DIAGNOSIS — R5381 Other malaise: Secondary | ICD-10-CM | POA: Diagnosis not present

## 2023-01-26 DIAGNOSIS — I255 Ischemic cardiomyopathy: Secondary | ICD-10-CM

## 2023-01-26 DIAGNOSIS — I351 Nonrheumatic aortic (valve) insufficiency: Secondary | ICD-10-CM | POA: Diagnosis not present

## 2023-01-26 DIAGNOSIS — G20A1 Parkinson's disease without dyskinesia, without mention of fluctuations: Secondary | ICD-10-CM

## 2023-01-26 DIAGNOSIS — M6281 Muscle weakness (generalized): Secondary | ICD-10-CM

## 2023-01-26 DIAGNOSIS — I25119 Atherosclerotic heart disease of native coronary artery with unspecified angina pectoris: Secondary | ICD-10-CM | POA: Diagnosis not present

## 2023-01-26 NOTE — Patient Instructions (Signed)
Medication Instructions:  Your physician recommends that you continue on your current medications as directed. Please refer to the Current Medication list given to you today.  *If you need a refill on your cardiac medications before your next appointment, please call your pharmacy*   Lab Work: None ordered If you have labs (blood work) drawn today and your tests are completely normal, you will receive your results only by: MyChart Message (if you have MyChart) OR A paper copy in the mail If you have any lab test that is abnormal or we need to change your treatment, we will call you to review the results.   Testing/Procedures: Your physician has requested that you have an echocardiogram. Echocardiography is a painless test that uses sound waves to create images of your heart. It provides your doctor with information about the size and shape of your heart and how well your heart's chambers and valves are working. This procedure takes approximately one hour. There are no restrictions for this procedure. Please do NOT wear cologne, perfume, aftershave, or lotions (deodorant is allowed). Please arrive 15 minutes prior to your appointment time.     Follow-Up: At [facility], you and your health needs are our priority.  As part of our continuing mission to provide you with exceptional heart care, we have created designated Provider Care Teams.  These Care Teams include your primary Cardiologist (physician) and Advanced Practice Providers (APPs -  Physician Assistants and Nurse Practitioners) who all work together to provide you with the care you need, when you need it.  We recommend signing up for the patient portal called "MyChart".  Sign up information is provided on this After Visit Summary.  MyChart is used to connect with patients for Virtual Visits (Telemedicine).  Patients are able to view lab/test results, encounter notes, upcoming appointments, etc.  Non-urgent messages can be sent to  your provider as well.   To learn more about what you can do with MyChart, go to [email or web address].    Your next appointment:   6 month(s)  The format for your next appointment:   In Person  Provider:   Robert Krasowski, MD   Other Instructions Echocardiogram An echocardiogram is a test that uses sound waves (ultrasound) to produce images of the heart. Images from an echocardiogram can provide important information about: Heart size and shape. The size and thickness and movement of your heart's walls. Heart muscle function and strength. Heart valve function or if you have stenosis. Stenosis is when the heart valves are too narrow. If blood is flowing backward through the heart valves (regurgitation). A tumor or infectious growth around the heart valves. Areas of heart muscle that are not working well because of poor blood flow or injury from a heart attack. Aneurysm detection. An aneurysm is a weak or damaged part of an artery wall. The wall bulges out from the normal force of blood pumping through the body. Tell a health care provider about: Any allergies you have. All medicines you are taking, including vitamins, herbs, eye drops, creams, and over-the-counter medicines. Any blood disorders you have. Any surgeries you have had. Any medical conditions you have. Whether you are pregnant or may be pregnant. What are the risks? Generally, this is a safe test. However, problems may occur, including an allergic reaction to dye (contrast) that may be used during the test. What happens before the test? No specific preparation is needed. You may eat and drink normally. What happens during the test? You will   take off your clothes from the waist up and put on a hospital gown. Electrodes or electrocardiogram (ECG)patches may be placed on your chest. The electrodes or patches are then connected to a device that monitors your heart rate and rhythm. You will lie down on a table for an  ultrasound exam. A gel will be applied to your chest to help sound waves pass through your skin. A handheld device, called a transducer, will be pressed against your chest and moved over your heart. The transducer produces sound waves that travel to your heart and bounce back (or "echo" back) to the transducer. These sound waves will be captured in real-time and changed into images of your heart that can be viewed on a video monitor. The images will be recorded on a computer and reviewed by your health care provider. You may be asked to change positions or hold your breath for a short time. This makes it easier to get different views or better views of your heart. In some cases, you may receive contrast through an IV in one of your veins. This can improve the quality of the pictures from your heart. The procedure may vary among health care providers and hospitals.   What can I expect after the test? You may return to your normal, everyday life, including diet, activities, and medicines, unless your health care provider tells you not to do that. Follow these instructions at home: It is up to you to get the results of your test. Ask your health care provider, or the department that is doing the test, when your results will be ready. Keep all follow-up visits. This is important. Summary An echocardiogram is a test that uses sound waves (ultrasound) to produce images of the heart. Images from an echocardiogram can provide important information about the size and shape of your heart, heart muscle function, heart valve function, and other possible heart problems. You do not need to do anything to prepare before this test. You may eat and drink normally. After the echocardiogram is completed, you may return to your normal, everyday life, unless your health care provider tells you not to do that. This information is not intended to replace advice given to you by your health care provider. Make sure you  discuss any questions you have with your health care provider. Document Revised: 03/10/2020 Document Reviewed: 03/10/2020 Elsevier Patient Education  2021 [employer]   Important Information About Sugar        

## 2023-01-26 NOTE — Progress Notes (Signed)
Cardiology Office Note:    Date:  01/26/2023   ID:  Shawn Meza, DOB [birth year], MRN [id number]  PCP:  Shawn Dory, DO  Cardiologist:  Shawn Balsam, MD    Referring MD: Shawn Meza*   Chief Complaint  Patient presents with   Follow-up    History of Present Illness:    Shawn Meza is a 77 y.o. 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  Comes today to months for follow-up.  Overall seems to be doing well.  Look like Parkinson's getting LV worse.  But he does participate in physical therapy very happy and satisfied with it.  No chest pain tightness squeezing pressure burning chest no dizziness no passing out.  He complained however having some heaviness in the chest when he lays flat at night  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   Diarrhea 11/04/2020   Elevated PSA    Erectile dysfunction    Essential hypertension    Essential tremor    GERD (gastroesophageal reflux disease)    Headache(784.0)    Hiatal hernia    Hypercholesterolemia    Long-term use of aspirin therapy    Low back pain 10/28/2019   Major depression, chronic    Medial meniscus tear 10/11/2011   Metabolic syndrome    Morbid obesity (HCC)    Myofascial pain 12/20/2019   Nephrolithiasis    hx of   NSTEMI (non-ST elevated myocardial infarction) (HCC)    Parkinson's disease    S/P CABG (coronary artery bypass graft)    Transient ischemic attack    hx of   Trochanteric bursitis of right hip     Past Surgical History:  Procedure Laterality Date   CARDIAC  CATHETERIZATION  5/12,1/13   4 stents placed   COLON SURGERY     CORONARY ARTERY BYPASS GRAFT     KNEE ARTHROSCOPY  10/11/2011   Procedure: ARTHROSCOPY KNEE;  Surgeon: Nilda Simmer, MD;  Location: [facility];  Service: Orthopedics;  Laterality: Left;  Left Knee Arthroscopy with Medial and Lateral Partial Menisectomy, Chondroplasty   LEFT HEART CATHETERIZATION WITH CORONARY ANGIOGRAM N/A 08/25/2011   Procedure: LEFT HEART CATHETERIZATION WITH CORONARY ANGIOGRAM;  Surgeon: Kathleene Hazel, MD;  Location: [facility] LAB;  Service: Cardiovascular;  Laterality: N/A;   LITHOTRIPSY     STERIOD INJECTION  10/11/2011   Procedure: STEROID INJECTION;  Surgeon: Nilda Simmer, MD;  Location: [facility];  Service: Orthopedics;  Laterality: Right;  Steroid Injection Second Toe   TRANSURETHRAL RESECTION OF PROSTATE     URETHRAL DILATION      Current Medications: Current Meds  Medication Sig   acetaminophen (TYLENOL) 325 MG tablet Take 162.5 mg by mouth every 6 (six) hours as needed for mild pain or moderate pain.   aspirin 81 MG EC tablet Take 1 tablet (81 mg total) by mouth daily.   beclomethasone (QVAR REDIHALER) 40 MCG/ACT inhaler Inhale 2 puffs into the lungs 2 (two) times daily.   Carbidopa-Levodopa ER (RYTARY) 61.25-245 MG CPCR Take 1 capsule by mouth 3 (three) times daily.  citalopram (CELEXA) 20 MG tablet Take 1 tablet (20 mg total) by mouth daily.   esomeprazole (NEXIUM) 40 MG capsule Take 1 capsule (40 mg total) by mouth at bedtime. (Patient taking differently: Take 40 mg by mouth at bedtime as needed (indigestion).)   fluticasone (FLONASE) 50 MCG/ACT nasal spray Place 2 sprays into both nostrils daily.   furosemide (LASIX) 40 MG tablet Take 20 mg by mouth as needed for fluid or edema (shortness of breath).   gabapentin (NEURONTIN) 100 MG capsule Take 1 capsule (100 mg total) by mouth at bedtime. (Patient taking differently: Take 300 mg by mouth at bedtime.)    levocetirizine (XYZAL) 5 MG tablet Take 1 tablet (5 mg total) by mouth every evening. For allergies   metoprolol tartrate (LOPRESSOR) 25 MG tablet Take 1 tablet (25 mg total) by mouth daily.   montelukast (SINGULAIR) 10 MG tablet Take 1 tablet (10 mg total) by mouth at bedtime.   nitroGLYCERIN (NITROSTAT) 0.4 MG SL tablet Place 1 tablet (0.4 mg total) under the tongue every 5 (five) minutes x 3 doses as needed for chest pain. If chest pain is not relieved after 2nd dose - call 911.   pravastatin (PRAVACHOL) 20 MG tablet Take 1 tablet (20 mg total) by mouth daily.   sacubitril-valsartan (ENTRESTO) 24-26 MG Take 1 tablet by mouth 2 (two) times daily. 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  Tobacco Use   Smoking status: Never    Passive exposure: Never   Smokeless tobacco: Never  Vaping Use   Vaping Use: Never used  Substance and Sexual Activity   Alcohol use: No   Drug use: No   Sexual activity: Not Currently  Other Topics Concern   Not on file  Social History Narrative   Divorced 2016   Caffeine use: none       Social Determinants of Health   Financial Resource Strain: Low Risk  (09/30/2021)   Overall Financial Resource Strain (CARDIA)    Difficulty of Paying Living Expenses: Not hard at all  Food Insecurity: Food Insecurity Present (07/13/2022)   Hunger Vital Sign    Worried About Running Out of Food in the Last Year: Sometimes true    Ran Out of Food in the Last Year: Sometimes true  Transportation Needs: No Transportation Needs (06/03/2022)   PRAPARE - Therapist, art (Medical): No    Lack of Transportation (Non-Medical): No  Physical Activity: Inactive (09/30/2021)   Exercise Vital Sign    Days of Exercise per Week: 0 days    Minutes of Exercise per Session: 0 min  Stress: Stress Concern Present (09/30/2021)   [employer] of Occupational Health - Occupational Stress Questionnaire    Feeling of Stress : Rather much  Social Connections: Moderately Isolated (09/30/2021)   Social Connection and Isolation Panel [NHANES]    Frequency of Communication with Friends and Family: More than three times a week    Frequency of Social Gatherings with Friends and Family: More than three times a week    Attends Religious Services:  More than 4 times per year    Active Member of Clubs or Organizations: No    Attends Banker Meetings: Never    Marital Status: Divorced     Family History: The patient's family history includes Arthritis in an other family member; Coronary artery disease in an other family member; Healthy in his daughter; Heart disease in his brother, father, and mother; Hyperlipidemia in his brother, father, and another family member; Hypertension in an other family member; Stroke in his father. There is no history of Dementia. ROS:   Please see the history of present illness.    All 14 point review of systems negative except as described per history of present illness  EKGs/Labs/Other Studies Reviewed:         Recent Labs: 03/31/2022: TSH 1.910 07/01/2022: NT-Pro BNP 293 11/28/2022: ALT 11; BUN 20; Creatinine, Ser 1.13; Hemoglobin 16.3; Platelets 206.0; Potassium 4.2; Sodium 138  Recent Lipid Panel    Component Value Date/Time   CHOL 166 11/28/2022 1424   CHOL 163 03/31/2022 1708   TRIG 144.0 11/28/2022 1424   TRIG 107 05/09/2010 0000   HDL 36.70 (L) 11/28/2022 1424   HDL 36 (L) 03/31/2022 1708   CHOLHDL 5 11/28/2022 1424   VLDL 28.8 11/28/2022 1424   LDLCALC 100 (H) 11/28/2022 1424   LDLCALC 91  03/31/2022 1708    Physical Exam:    VS:  BP 128/72 (BP Location: Left Arm, Patient Position: Sitting)   Pulse 83   Ht 5\' 7"  (1.702 m)   Wt 203 lb 9.6 oz (92.4 kg)   SpO2 97%   BMI 31.89 kg/m     Wt Readings from Last 3 Encounters:  01/26/23 203 lb 9.6 oz (92.4 kg)  12/28/22 201 lb 4 oz (91.3 kg)  11/28/22 203 lb 2 oz (92.1 kg)     GEN:  Well nourished, well developed in no acute distress HEENT: Normal NECK: No JVD; No carotid bruits LYMPHATICS: No lymphadenopathy CARDIAC: RRR, no murmurs, no rubs, no gallops RESPIRATORY:  Clear to auscultation without rales, wheezing or rhonchi  ABDOMEN: Soft, non-tender, non-distended MUSCULOSKELETAL:  No edema; No deformity  SKIN: Warm and dry LOWER EXTREMITIES: no swelling NEUROLOGIC:  Alert and oriented x 3 PSYCHIATRIC:  Normal affect   ASSESSMENT:    1. Coronary artery disease involving native coronary artery of native heart with angina pectoris (HCC)   2. Ischemic cardiomyopathy   3. Nonrheumatic aortic valve insufficiency    PLAN:    In order of problems listed above:  Coronary disease stable from that point to an appropriate medication continue present management. Ischemic cardiomyopathy.  Guideline directed medical therapy, on appropriate medications I will check Chem-7 today if Chem-7 is fine we will increase dose of Entresto. Heaviness in the chest that feels when he lay flat.  Will get echocardiogram make sure his ejection fraction is preserved   Medication Adjustments/Labs and Tests Ordered: Current medicines are reviewed at length with the patient today.  Concerns regarding medicines are outlined above.  No orders of the defined types were placed in this encounter.  Medication changes: No orders of the defined types were placed in this encounter.   Signed, Georgeanna Lea, MD, [facility] 01/26/2023 2:26 PM    [facility]

## 2023-01-26 NOTE — Addendum Note (Signed)
Addended by: Eleonore Chiquito on: 01/26/2023 02:31 PM   Modules accepted: Orders

## 2023-01-27 ENCOUNTER — Other Ambulatory Visit: Payer: Self-pay | Admitting: Family Medicine

## 2023-01-27 ENCOUNTER — Ambulatory Visit: Payer: 59 | Admitting: Family Medicine

## 2023-01-27 DIAGNOSIS — G20A1 Parkinson's disease without dyskinesia, without mention of fluctuations: Secondary | ICD-10-CM

## 2023-01-29 NOTE — Therapy (Unsigned)
OUTPATIENT PHYSICAL THERAPY TREATMENT NOTE   Patient Name: Shawn Meza MRN: [id number] DOB:1946-01-08, 77 y.o., male Today's Date: 01/30/2023  PCP: [name], DO  REFERRING PROVIDER: [name], DO   END OF SESSION:   PT End of Session - 01/30/23 1700     Visit Number 4    Number of Visits 8    Date for PT Re-Evaluation 03/14/23    Authorization Type UHC MCR    Progress Note Due on Visit 10    PT Start Time 1700    PT Stop Time 1738    PT Time Calculation (min) 38 min    Activity Tolerance Patient tolerated treatment well    Behavior During Therapy WFL for tasks assessed/performed             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   Diarrhea 11/04/2020   Elevated PSA    Erectile dysfunction    Essential hypertension    Essential tremor    GERD (gastroesophageal reflux disease)    Headache(784.0)    Hiatal hernia    Hypercholesterolemia    Long-term use of aspirin therapy    Low back pain 10/28/2019   Major depression, chronic    Medial meniscus tear 10/11/2011   Metabolic syndrome    Morbid obesity (HCC)    Myofascial pain 12/20/2019   Nephrolithiasis    hx of   NSTEMI (non-ST elevated myocardial infarction) (HCC)    Parkinson's disease    S/P CABG (coronary artery bypass graft)    Transient ischemic attack    hx of   Trochanteric bursitis of right hip    Past Surgical History:  Procedure Laterality Date   CARDIAC CATHETERIZATION  5/12,1/13   4 stents placed   COLON SURGERY     CORONARY ARTERY BYPASS GRAFT     KNEE ARTHROSCOPY  10/11/2011   Procedure: ARTHROSCOPY KNEE;  Surgeon: Nilda Simmer, MD;  Location: [facility];  Service: Orthopedics;  Laterality: Left;  Left Knee Arthroscopy with Medial and Lateral Partial Menisectomy, Chondroplasty   LEFT HEART  CATHETERIZATION WITH CORONARY ANGIOGRAM N/A 08/25/2011   Procedure: LEFT HEART CATHETERIZATION WITH CORONARY ANGIOGRAM;  Surgeon: Kathleene Hazel, MD;  Location: [facility] C[facility] LAB;  Service: Cardiovascular;  Laterality: N/A;   LITHOTRIPSY     STERIOD INJECTION  10/11/2011   Procedure: STEROID INJECTION;  Surgeon: Nilda Simmer, MD;  Location: [facility];  Service: Orthopedics;  Laterality: Right;  Steroid Injection Second Toe   TRANSURETHRAL RESECTION OF PROSTATE     URETHRAL DILATION     Patient Active Problem List   Diagnosis Date Noted   Major depressive disorder, recurrent episode, moderate (HCC) 06/30/2022   Lumbar spondylosis 06/29/2022   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   Diarrhea 11/04/2020   Rectal bleeding 11/04/2020   Trochanteric bursitis of right hip    Transient ischemic attack    NSTEMI (non-ST elevated myocardial infarction) (HCC)    Nephrolithiasis    Metabolic syndrome    Major depression, chronic    Long-term use of aspirin therapy    Hypercholesterolemia    Hiatal hernia    GERD (gastroesophageal reflux disease)    Essential tremor    Essential hypertension    Erectile dysfunction    Elevated PSA  Colon cancer (HCC)    Chronic coronary artery disease    Arthritis    Myofascial pain 12/20/2019   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   Low back pain 10/28/2019   Radiculopathy, lumbar region 10/28/2019   Fatigue 01/15/2019   Chronic systolic (congestive) heart failure (HCC) 09/18/2018   Ischemic cardiomyopathy 09/16/2018   Aortic regurgitation 09/16/2018   Cardiomyopathy, unspecified (HCC) 06/13/2018   Abscess of right axilla 12/12/2017   BMI 33.0-33.9,adult 12/12/2017   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   Acute blood loss anemia 10/25/2017   Acute postoperative respiratory insufficiency 10/25/2017   Postoperative delirium 10/25/2017   Dyslipidemia 10/17/2017   Chest pain in adult 10/11/2017   SOB (shortness of  breath) 03/31/2017   Long term current use of aspirin 03/29/2017   Parkinson's disease 01/02/2017   Memory change 07/06/2015   Depression, recurrent (HCC) 07/06/2015   Medial meniscus tear 10/11/2011   Neuroma of foot 10/11/2011   Coronary artery disease involving native coronary artery of native heart with angina pectoris (HCC) 12/28/2010   OTHER TESTICULAR HYPOFUNCTION 05/20/2010   Mixed hyperlipidemia 05/20/2010   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/20/2010   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 05/20/2010   GERD 05/20/2010   TRANSIENT ISCHEMIC ATTACK, HX OF 05/20/2010   NEPHROLITHIASIS, HX OF 05/20/2010   BENIGN PROSTATIC HYPERTROPHY, HX OF, S/P TURP 05/20/2010    REFERRING DIAG: R53.81 (ICD-10-CM) - Physical deconditioning   THERAPY DIAG:  Muscle weakness (generalized)  Physical deconditioning  Parkinson's disease, unspecified whether dyskinesia present, unspecified whether manifestations fluctuate  Rationale for Evaluation and Treatment Rehabilitation  PERTINENT HISTORY: parkinsons   PRECAUTIONS: Fall   SUBJECTIVE:                                                                                                                                                                                      SUBJECTIVE STATEMENT:  Continues to report post exercises soreness, dissipates after a few days, no worse in intensity.   PAIN:  Are you having pain? No   OBJECTIVE: (objective measures completed at initial evaluation unless otherwise dated)   DIAGNOSTIC FINDINGS: none   PATIENT SURVEYS:  FOTO 30(40 predicted)   MUSCLE LENGTH: Hamstrings: Right 60 deg; Left 60 deg   POSTURE:  flexed posture   LOWER EXTREMITY ROM: WFL for gait, transfers and bed mobility   Active ROM Right eval Left eval  Hip flexion      Hip extension      Hip abduction      Hip adduction      Hip internal rotation      Hip external rotation  Knee flexion       Knee extension      Ankle dorsiflexion      Ankle plantarflexion      Ankle inversion      Ankle eversion       (Blank rows = not tested)   LOWER EXTREMITY MMT:   MMT Right eval Left eval  Hip flexion 4- 3+  Hip extension 4- 3+  Hip abduction 4- 3+  Hip adduction      Hip internal rotation      Hip external rotation      Knee flexion 4- 3+  Knee extension 4- 3+  Ankle dorsiflexion      Ankle plantarflexion 4- 3+  Ankle inversion      Ankle eversion       (Blank rows = not tested)       FUNCTIONAL TESTS:  5 times sit to stand: N/T 30 seconds chair stand test 7 reps with UE assist   GAIT: Distance walked: 59ft x2 Assistive device utilized: None Level of assistance: Complete Independence Comments: festinating gait with slow cadence     TODAY'S TREATMENT:     [facility] Adult PT Treatment:                                                DATE: 01/30/23 Therapeutic Exercise: Nustep L2 6 min Seated hamstring stretch on footstool 30s x2 B Supine QL stretch 30s x2 B SKTC 30s x2 B Supine march 15/15 2# 257ft ambulation holding stick behind back to facilitate upright posture   [facility] Adult PT Treatment:                                                DATE: 01/26/23 Therapeutic Exercise: Nustep L2 6 min Seated hamstring stretch on footstool 30s x2 B Supine QL stretch 30s x2 B Supine march 15/15 261ft ambulation holding stick behind back to facilitate upright posture     [facility] Adult PT Treatment:                                                DATE: 01/23/23 Therapeutic Exercise: Seated hamstring stretch on footstool 30s x2 B Supine QL stretch 30s x2 B STS arms crossed 5x  Neuromuscular re-ed:   01/23/23 0001  Berg Balance Test  Sit to Stand 4  Standing Unsupported 4  Sitting with Back Unsupported but Feet Supported on Floor or Stool 4  Stand to Sit 4  Transfers 4  Standing Unsupported with Eyes Closed 4  Standing Unsupported with Feet Together 4  From Standing, Reach  Forward with Outstretched Arm 4  From Standing Position, Pick up Object from Floor 4  From Standing Position, Turn to Look Behind Over each Shoulder 2  Turn 360 Degrees 2  Standing Unsupported, Alternately Place Feet on Step/Stool 4  Standing Unsupported, One Foot in Front 3  Standing on One Leg 1  Total Score 48   2 MWT 279ft w/o AD  DATE: 01/17/23 Eval and HEP     PATIENT EDUCATION:  Education details: Discussed eval findings, rehab rationale and POC and patient is in agreement  Person educated: Patient Education method: Explanation Education comprehension: verbalized understanding and needs further education   HOME EXERCISE PROGRAM: Access Code: [email or web address] URL: [email or web address] Date: 01/26/2023 Prepared by: Gustavus Bryant  Exercises - Sit to Stand with Arms Crossed  - 2 x daily - 5 x weekly - 1 sets - 5 reps - Seated Hamstring Stretch  - 2 x daily - 5 x weekly - 1 sets - 2 reps - 30s hold - Supine Quadratus Lumborum Stretch  - 2 x daily - 5 x weekly - 1 sets - 2 reps - 30s hold   ASSESSMENT:   CLINICAL IMPRESSION: Continued to focus on aerobic work, LE strengthening, core stabilization and flexibility.  Advanced to weighted marching and added SKTC for mobility prior to gait training.  Patient is a 77 y.o. male who was seen today for physical therapy evaluation and treatment for general deconditioning combined with a history of Parkinson's. He relates a history of chronic low back pain which also limits activity tolerance.  He is a relatively poor historian regarding previous PT and PMHx and does not have a good understanding of his Parkinson's and cardiac disease processes.  LE weakness noted a well as global stiffness resultant of PD.  30s chair stand test and FOTO demonstrate actual and perceived functional deficits.   OBJECTIVE IMPAIRMENTS:  cardiopulmonary status limiting activity, decreased activity tolerance, decreased coordination, decreased endurance, decreased knowledge of condition, decreased mobility, difficulty walking, decreased strength, impaired flexibility, postural dysfunction, pain, and Parkinsonian symptoms .    ACTIVITY LIMITATIONS: carrying, lifting, bending, squatting, stairs, and reach over head   PERSONAL FACTORS: Age, Fitness, Past/current experiences, Time since onset of injury/illness/exacerbation, and 1-2 comorbidities: Parkinsons Disease  are also affecting patient's functional outcome.    REHAB POTENTIAL: Good   CLINICAL DECISION MAKING: Evolving/moderate complexity   EVALUATION COMPLEXITY: Low     GOALS: Goals reviewed with patient? No   SHORT TERM GOALS: Target date: 03/19/23 Patient to demonstrate independence in HEP  Baseline: [id number] Goal status: INITIAL   2.  222ft ambulation w/o need of AD Baseline: 102ft with slow cadence and festinating gait; 01/23/23 256ft during 2 MWT Goal status: MET   3.  Patient to negotiate 4 steps with handrail assist and most appropriate pattern Baseline: TBD Goal status: INITIAL   4.  Increase FOTO score to 40 Baseline: 30 Goal status: INITIAL   5.  Increase B LE strength to 4/5 Baseline:  MMT Right eval Left eval  Hip flexion 4- 3+  Hip extension 4- 3+  Hip abduction 4- 3+  Hip adduction      Hip internal rotation      Hip external rotation      Knee flexion 4- 3+  Knee extension 4- 3+  Ankle dorsiflexion      Ankle plantarflexion 4- 3+    Goal status: INITIAL       PLAN:   PT FREQUENCY: 1-2x/week   PT DURATION: 4 weeks   PLANNED INTERVENTIONS: Therapeutic exercises, Therapeutic activity, Neuromuscular re-education, Balance training, Gait training, Patient/Family education, Self Care, Joint mobilization, Stair training, DME instructions, Dry Needling, Manual therapy, and Re-evaluation   PLAN FOR NEXT SESSION: HEP review and update,  manual techniques as appropriate, aerobic tasks, ROM and flexibility activities, strengthening and PREs, TPDN, gait and balance training as needed     [facility]  Rondel Baton, PT 01/30/2023, 5:32 PM

## 2023-01-30 ENCOUNTER — Ambulatory Visit: Payer: 59 | Attending: Family Medicine

## 2023-01-30 DIAGNOSIS — G20A1 Parkinson's disease without dyskinesia, without mention of fluctuations: Secondary | ICD-10-CM | POA: Diagnosis not present

## 2023-01-30 DIAGNOSIS — R2689 Other abnormalities of gait and mobility: Secondary | ICD-10-CM | POA: Diagnosis not present

## 2023-01-30 DIAGNOSIS — R5381 Other malaise: Secondary | ICD-10-CM | POA: Insufficient documentation

## 2023-01-30 DIAGNOSIS — R2681 Unsteadiness on feet: Secondary | ICD-10-CM | POA: Insufficient documentation

## 2023-01-30 DIAGNOSIS — R29818 Other symptoms and signs involving the nervous system: Secondary | ICD-10-CM | POA: Diagnosis not present

## 2023-01-30 DIAGNOSIS — M6281 Muscle weakness (generalized): Secondary | ICD-10-CM | POA: Insufficient documentation

## 2023-01-31 NOTE — Therapy (Signed)
OUTPATIENT PHYSICAL THERAPY TREATMENT NOTE   Patient Name: Shawn Meza MRN: [id number] DOB:Jan 10, 1946, 77 y.o., male Today's Date: 02/01/2023  PCP: [name], DO  REFERRING PROVIDER: [name], DO   END OF SESSION:   PT End of Session - 02/01/23 1729     Visit Number 5    Number of Visits 8    Date for PT Re-Evaluation 03/14/23    Authorization Type UHC MCR    Progress Note Due on Visit 10    PT Start Time 1725    PT Stop Time 1805    PT Time Calculation (min) 40 min    Activity Tolerance Patient tolerated treatment well    Behavior During Therapy [facility] for tasks assessed/performed              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   Diarrhea 11/04/2020   Elevated PSA    Erectile dysfunction    Essential hypertension    Essential tremor    GERD (gastroesophageal reflux disease)    Headache(784.0)    Hiatal hernia    Hypercholesterolemia    Long-term use of aspirin therapy    Low back pain 10/28/2019   Major depression, chronic    Medial meniscus tear 10/11/2011   Metabolic syndrome    Morbid obesity (HCC)    Myofascial pain 12/20/2019   Nephrolithiasis    hx of   NSTEMI (non-ST elevated myocardial infarction) (HCC)    Parkinson's disease    S/P CABG (coronary artery bypass graft)    Transient ischemic attack    hx of   Trochanteric bursitis of right hip    Past Surgical History:  Procedure Laterality Date   CARDIAC CATHETERIZATION  5/12,1/13   4 stents placed   COLON SURGERY     CORONARY ARTERY BYPASS GRAFT     KNEE ARTHROSCOPY  10/11/2011   Procedure: ARTHROSCOPY KNEE;  Surgeon: Nilda Simmer, MD;  Location: [facility];  Service: Orthopedics;  Laterality: Left;  Left Knee Arthroscopy with Medial and Lateral Partial Menisectomy, Chondroplasty   LEFT HEART  CATHETERIZATION WITH CORONARY ANGIOGRAM N/A 08/25/2011   Procedure: LEFT HEART CATHETERIZATION WITH CORONARY ANGIOGRAM;  Surgeon: Kathleene Hazel, MD;  Location: [facility] CATH LAB;  Service: Cardiovascular;  Laterality: N/A;   LITHOTRIPSY     STERIOD INJECTION  10/11/2011   Procedure: STEROID INJECTION;  Surgeon: Nilda Simmer, MD;  Location: [facility];  Service: Orthopedics;  Laterality: Right;  Steroid Injection Second Toe   TRANSURETHRAL RESECTION OF PROSTATE     URETHRAL DILATION     Patient Active Problem List   Diagnosis Date Noted   Major depressive disorder, recurrent episode, moderate (HCC) 06/30/2022   Lumbar spondylosis 06/29/2022   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   Diarrhea 11/04/2020   Rectal bleeding 11/04/2020   Trochanteric bursitis of right hip    Transient ischemic attack    NSTEMI (non-ST elevated myocardial infarction) (HCC)    Nephrolithiasis    Metabolic syndrome    Major depression, chronic    Long-term use of aspirin therapy    Hypercholesterolemia    Hiatal hernia    GERD (gastroesophageal reflux disease)    Essential tremor    Essential hypertension    Erectile dysfunction    Elevated PSA  Colon cancer (HCC)    Chronic coronary artery disease    Arthritis    Myofascial pain 12/20/2019   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   Low back pain 10/28/2019   Radiculopathy, lumbar region 10/28/2019   Fatigue 01/15/2019   Chronic systolic (congestive) heart failure (HCC) 09/18/2018   Ischemic cardiomyopathy 09/16/2018   Aortic regurgitation 09/16/2018   Cardiomyopathy, unspecified (HCC) 06/13/2018   Abscess of right axilla 12/12/2017   BMI 33.0-33.9,adult 12/12/2017   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   Acute blood loss anemia 10/25/2017   Acute postoperative respiratory insufficiency 10/25/2017   Postoperative delirium 10/25/2017   Dyslipidemia 10/17/2017   Chest pain in adult 10/11/2017   SOB (shortness of  breath) 03/31/2017   Long term current use of aspirin 03/29/2017   Parkinson's disease 01/02/2017   Memory change 07/06/2015   Depression, recurrent (HCC) 07/06/2015   Medial meniscus tear 10/11/2011   Neuroma of foot 10/11/2011   Coronary artery disease involving native coronary artery of native heart with angina pectoris (HCC) 12/28/2010   OTHER TESTICULAR HYPOFUNCTION 05/20/2010   Mixed hyperlipidemia 05/20/2010   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/20/2010   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 05/20/2010   GERD 05/20/2010   TRANSIENT ISCHEMIC ATTACK, HX OF 05/20/2010   NEPHROLITHIASIS, HX OF 05/20/2010   BENIGN PROSTATIC HYPERTROPHY, HX OF, S/P TURP 05/20/2010    REFERRING DIAG: R53.81 (ICD-10-CM) - Physical deconditioning   THERAPY DIAG:  Muscle weakness (generalized)  Physical deconditioning  Parkinson's disease, unspecified whether dyskinesia present, unspecified whether manifestations fluctuate  Rationale for Evaluation and Treatment Rehabilitation  PERTINENT HISTORY: parkinsons   PRECAUTIONS: Fall   SUBJECTIVE:                                                                                                                                                                                      SUBJECTIVE STATEMENT:  Feels he is not as sore post treatments    PAIN:  Are you having pain? No   OBJECTIVE: (objective measures completed at initial evaluation unless otherwise dated)   DIAGNOSTIC FINDINGS: none   PATIENT SURVEYS:  FOTO 30(40 predicted)   MUSCLE LENGTH: Hamstrings: Right 60 deg; Left 60 deg   POSTURE:  flexed posture   LOWER EXTREMITY ROM: WFL for gait, transfers and bed mobility   Active ROM Right eval Left eval  Hip flexion      Hip extension      Hip abduction      Hip adduction      Hip internal rotation      Hip external rotation      Knee flexion  Knee extension      Ankle dorsiflexion      Ankle  plantarflexion      Ankle inversion      Ankle eversion       (Blank rows = not tested)   LOWER EXTREMITY MMT:   MMT Right eval Left eval  Hip flexion 4- 3+  Hip extension 4- 3+  Hip abduction 4- 3+  Hip adduction      Hip internal rotation      Hip external rotation      Knee flexion 4- 3+  Knee extension 4- 3+  Ankle dorsiflexion      Ankle plantarflexion 4- 3+  Ankle inversion      Ankle eversion       (Blank rows = not tested)       FUNCTIONAL TESTS:  5 times sit to stand: N/T 30 seconds chair stand test 7 reps with UE assist   GAIT: Distance walked: 72ft x2 Assistive device utilized: None Level of assistance: Complete Independence Comments: festinating gait with slow cadence     TODAY'S TREATMENT:     [facility] Adult PT Treatment:                                                DATE: 02/01/23 Therapeutic Exercise: Nustep L3 6 min Seated hamstring stretch on footstool 30s x2 B Supine QL stretch 30s x2 B Hip flexor stretch 30s x2 Supine march 15/15 3# Step taps to 6 in block  270ft ambulation holding stick behind back to facilitate upright posture  [facility] Adult PT Treatment:                                                DATE: 01/30/23 Therapeutic Exercise: Nustep L2 6 min Seated hamstring stretch on footstool 30s x2 B Supine QL stretch 30s x2 B SKTC 30s x2 B Supine march 15/15 2# 213ft ambulation holding stick behind back to facilitate upright posture   [facility] Adult PT Treatment:                                                DATE: 01/26/23 Therapeutic Exercise: Nustep L2 6 min Seated hamstring stretch on footstool 30s x2 B Supine QL stretch 30s x2 B Supine march 15/15 254ft ambulation holding stick behind back to facilitate upright posture     [facility] Adult PT Treatment:                                                DATE: 01/23/23 Therapeutic Exercise: Seated hamstring stretch on footstool 30s x2 B Supine QL stretch 30s x2 B STS arms crossed  5x  Neuromuscular re-ed:   01/23/23 0001  Berg Balance Test  Sit to Stand 4  Standing Unsupported 4  Sitting with Back Unsupported but Feet Supported on Floor or Stool 4  Stand to Sit 4  Transfers 4  Standing Unsupported with Eyes Closed 4  Standing Unsupported  with Feet Together 4  From Standing, Reach Forward with Outstretched Arm 4  From Standing Position, Pick up Object from Floor 4  From Standing Position, Turn to Look Behind Over each Shoulder 2  Turn 360 Degrees 2  Standing Unsupported, Alternately Place Feet on Step/Stool 4  Standing Unsupported, One Foot in Front 3  Standing on One Leg 1  Total Score 48   2 MWT 272ft w/o AD                                                                                                                       DATE: 01/17/23 Eval and HEP     PATIENT EDUCATION:  Education details: Discussed eval findings, rehab rationale and POC and patient is in agreement  Person educated: Patient Education method: Explanation Education comprehension: verbalized understanding and needs further education   HOME EXERCISE PROGRAM: Access Code: [email or web address] URL: [email or web address] Date: 01/26/2023 Prepared by: Gustavus Bryant  Exercises - Sit to Stand with Arms Crossed  - 2 x daily - 5 x weekly - 1 sets - 5 reps - Seated Hamstring Stretch  - 2 x daily - 5 x weekly - 1 sets - 2 reps - 30s hold - Supine Quadratus Lumborum Stretch  - 2 x daily - 5 x weekly - 1 sets - 2 reps - 30s hold   ASSESSMENT:   CLINICAL IMPRESSION: Added resistance to exercises and aerobic work today.  Continued stretching tasks to alleviate back pain.  Added hip flexor stretch as well as stepping tasks to incorporate alternating LE and SLS tasks to promote proprioception and balance.  Patient is a 77 y.o. male who was seen today for physical therapy evaluation and treatment for general deconditioning combined with a history of Parkinson's. He relates a history of  chronic low back pain which also limits activity tolerance.  He is a relatively poor historian regarding previous PT and PMHx and does not have a good understanding of his Parkinson's and cardiac disease processes.  LE weakness noted a well as global stiffness resultant of PD.  30s chair stand test and FOTO demonstrate actual and perceived functional deficits.   OBJECTIVE IMPAIRMENTS: cardiopulmonary status limiting activity, decreased activity tolerance, decreased coordination, decreased endurance, decreased knowledge of condition, decreased mobility, difficulty walking, decreased strength, impaired flexibility, postural dysfunction, pain, and Parkinsonian symptoms .    ACTIVITY LIMITATIONS: carrying, lifting, bending, squatting, stairs, and reach over head   PERSONAL FACTORS: Age, Fitness, Past/current experiences, Time since onset of injury/illness/exacerbation, and 1-2 comorbidities: Parkinsons Disease  are also affecting patient's functional outcome.    REHAB POTENTIAL: Good   CLINICAL DECISION MAKING: Evolving/moderate complexity   EVALUATION COMPLEXITY: Low     GOALS: Goals reviewed with patient? No   SHORT TERM GOALS: Target date: 03/19/23 Patient to demonstrate independence in HEP  Baseline: [id number] Goal status: INITIAL   2.  250ft ambulation w/o need of AD Baseline: 45ft with slow cadence and festinating gait; 01/23/23 269ft during 2 MWT Goal  status: MET   3.  Patient to negotiate 4 steps with handrail assist and most appropriate pattern Baseline: TBD Goal status: INITIAL   4.  Increase FOTO score to 40 Baseline: 30 Goal status: INITIAL   5.  Increase B LE strength to 4/5 Baseline:  MMT Right eval Left eval  Hip flexion 4- 3+  Hip extension 4- 3+  Hip abduction 4- 3+  Hip adduction      Hip internal rotation      Hip external rotation      Knee flexion 4- 3+  Knee extension 4- 3+  Ankle dorsiflexion      Ankle plantarflexion 4- 3+    Goal status: INITIAL        PLAN:   PT FREQUENCY: 1-2x/week   PT DURATION: 4 weeks   PLANNED INTERVENTIONS: Therapeutic exercises, Therapeutic activity, Neuromuscular re-education, Balance training, Gait training, Patient/Family education, Self Care, Joint mobilization, Stair training, DME instructions, Dry Needling, Manual therapy, and Re-evaluation   PLAN FOR NEXT SESSION: HEP review and update, manual techniques as appropriate, aerobic tasks, ROM and flexibility activities, strengthening and PREs, TPDN, gait and balance training as needed     Hildred Laser, PT 02/01/2023, 5:30 PM

## 2023-02-01 ENCOUNTER — Ambulatory Visit (INDEPENDENT_AMBULATORY_CARE_PROVIDER_SITE_OTHER): Payer: 59 | Admitting: Psychology

## 2023-02-01 ENCOUNTER — Ambulatory Visit: Payer: 59

## 2023-02-01 DIAGNOSIS — R5381 Other malaise: Secondary | ICD-10-CM

## 2023-02-01 DIAGNOSIS — M6281 Muscle weakness (generalized): Secondary | ICD-10-CM

## 2023-02-01 DIAGNOSIS — R2681 Unsteadiness on feet: Secondary | ICD-10-CM | POA: Diagnosis not present

## 2023-02-01 DIAGNOSIS — F331 Major depressive disorder, recurrent, moderate: Secondary | ICD-10-CM | POA: Diagnosis not present

## 2023-02-01 DIAGNOSIS — R29818 Other symptoms and signs involving the nervous system: Secondary | ICD-10-CM | POA: Diagnosis not present

## 2023-02-01 DIAGNOSIS — G20A1 Parkinson's disease without dyskinesia, without mention of fluctuations: Secondary | ICD-10-CM

## 2023-02-01 DIAGNOSIS — R2689 Other abnormalities of gait and mobility: Secondary | ICD-10-CM | POA: Diagnosis not present
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
## 2023-02-05 NOTE — Therapy (Signed)
OUTPATIENT PHYSICAL THERAPY TREATMENT NOTE   Patient Name: CASYN TORRENCE MRN: [id number] DOB:1946/04/16, 77 y.o., male Today's Date: 02/01/2023  PCP: [name], DO  REFERRING PROVIDER: [name], DO   END OF SESSION:   PT End of Session - 02/01/23 1729     Visit Number 5    Number of Visits 8    Date for PT Re-Evaluation 03/14/23    Authorization Type U[employer] MCR    Progress Note Due on Visit 10    PT Start Time 1725    PT Stop Time 1805    PT Time Calculation (min) 40 min    Activity Tolerance Patient tolerated treatment well    Behavior During Therapy [facility] for tasks assessed/performed              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   Diarrhea 11/04/2020   Elevated PSA    Erectile dysfunction    Essential hypertension    Essential tremor    GERD (gastroesophageal reflux disease)    Headache(784.0)    Hiatal hernia    Hypercholesterolemia    Long-term use of aspirin therapy    Low back pain 10/28/2019   Major depression, chronic    Medial meniscus tear 10/11/2011   Metabolic syndrome    Morbid obesity (HCC)    Myofascial pain 12/20/2019   Nephrolithiasis    hx of   NSTEMI (non-ST elevated myocardial infarction) (HCC)    Parkinson's disease    S/P CABG (coronary artery bypass graft)    Transient ischemic attack    hx of   Trochanteric bursitis of right hip    Past Surgical History:  Procedure Laterality Date   CARDIAC CATHETERIZATION  5/12,1/13   4 stents placed   COLON SURGERY     CORONARY ARTERY BYPASS GRAFT     KNEE ARTHROSCOPY  10/11/2011   Procedure: ARTHROSCOPY KNEE;  Surgeon: Nilda Simmer, MD;  Location: [facility];  Service: Orthopedics;  Laterality: Left;  Left Knee Arthroscopy with Medial and Lateral Partial Menisectomy, Chondroplasty   LEFT HEART  CATHETERIZATION WITH CORONARY ANGIOGRAM N/A 08/25/2011   Procedure: LEFT HEART CATHETERIZATION WITH CORONARY ANGIOGRAM;  Surgeon: Kathleene Hazel, MD;  Location: [facility] CATH LAB;  Service: Cardiovascular;  Laterality: N/A;   LITHOTRIPSY     STERIOD INJECTION  10/11/2011   Procedure: STEROID INJECTION;  Surgeon: Nilda Simmer, MD;  Location: [facility];  Service: Orthopedics;  Laterality: Right;  Steroid Injection Second Toe   TRANSURETHRAL RESECTION OF PROSTATE     URETHRAL DILATION     Patient Active Problem List   Diagnosis Date Noted   Major depressive disorder, recurrent episode, moderate (HCC) 06/30/2022   Lumbar spondylosis 06/29/2022   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   Diarrhea 11/04/2020   Rectal bleeding 11/04/2020   Trochanteric bursitis of right hip    Transient ischemic attack    NSTEMI (non-ST elevated myocardial infarction) (HCC)    Nephrolithiasis    Metabolic syndrome    Major depression, chronic    Long-term use of aspirin therapy    Hypercholesterolemia    Hiatal hernia    GERD (gastroesophageal reflux disease)    Essential tremor    Essential hypertension    Erectile dysfunction    Elevated PSA  Colon cancer (HCC)    Chronic coronary artery disease    Arthritis    Myofascial pain 12/20/2019   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   Low back pain 10/28/2019   Radiculopathy, lumbar region 10/28/2019   Fatigue 01/15/2019   Chronic systolic (congestive) heart failure (HCC) 09/18/2018   Ischemic cardiomyopathy 09/16/2018   Aortic regurgitation 09/16/2018   Cardiomyopathy, unspecified (HCC) 06/13/2018   Abscess of right axilla 12/12/2017   BMI 33.0-33.9,adult 12/12/2017   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   Acute blood loss anemia 10/25/2017   Acute postoperative respiratory insufficiency 10/25/2017   Postoperative delirium 10/25/2017   Dyslipidemia 10/17/2017   Chest pain in adult 10/11/2017   SOB (shortness of  breath) 03/31/2017   Long term current use of aspirin 03/29/2017   Parkinson's disease 01/02/2017   Memory change 07/06/2015   Depression, recurrent (HCC) 07/06/2015   Medial meniscus tear 10/11/2011   Neuroma of foot 10/11/2011   Coronary artery disease involving native coronary artery of native heart with angina pectoris (HCC) 12/28/2010   OTHER TESTICULAR HYPOFUNCTION 05/20/2010   Mixed hyperlipidemia 05/20/2010   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/20/2010   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 05/20/2010   GERD 05/20/2010   TRANSIENT ISCHEMIC ATTACK, HX OF 05/20/2010   NEPHROLITHIASIS, HX OF 05/20/2010   BENIGN PROSTATIC HYPERTROPHY, HX OF, S/P TURP 05/20/2010    REFERRING DIAG: R53.81 (ICD-10-CM) - Physical deconditioning   THERAPY DIAG:  Muscle weakness (generalized)  Physical deconditioning  Parkinson's disease, unspecified whether dyskinesia present, unspecified whether manifestations fluctuate  Rationale for Evaluation and Treatment Rehabilitation  PERTINENT HISTORY: parkinsons   PRECAUTIONS: Fall   SUBJECTIVE:                                                                                                                                                                                      SUBJECTIVE STATEMENT:  No changes to note.  Has not yet heard back on Parkinson's referral.   PAIN:  Are you having pain? No   OBJECTIVE: (objective measures completed at initial evaluation unless otherwise dated)   DIAGNOSTIC FINDINGS: none   PATIENT SURVEYS:  FOTO 30(40 predicted)   MUSCLE LENGTH: Hamstrings: Right 60 deg; Left 60 deg   POSTURE:  flexed posture   LOWER EXTREMITY ROM: WFL for gait, transfers and bed mobility   Active ROM Right eval Left eval  Hip flexion      Hip extension      Hip abduction      Hip adduction      Hip internal rotation      Hip external rotation  Knee flexion      Knee extension      Ankle  dorsiflexion      Ankle plantarflexion      Ankle inversion      Ankle eversion       (Blank rows = not tested)   LOWER EXTREMITY MMT:   MMT Right eval Left eval  Hip flexion 4- 3+  Hip extension 4- 3+  Hip abduction 4- 3+  Hip adduction      Hip internal rotation      Hip external rotation      Knee flexion 4- 3+  Knee extension 4- 3+  Ankle dorsiflexion      Ankle plantarflexion 4- 3+  Ankle inversion      Ankle eversion       (Blank rows = not tested)       FUNCTIONAL TESTS:  5 times sit to stand: N/T 30 seconds chair stand test 7 reps with UE assist   GAIT: Distance walked: 24ft x2 Assistive device utilized: None Level of assistance: Complete Independence Comments: festinating gait with slow cadence     TODAY'S TREATMENT:     [facility] Adult PT Treatment:                                                DATE: 02/06/23 Therapeutic Exercise: Nustep L4 8 min Seated hamstring stretch on footstool 30s x2 B Supine QL stretch 30s x2 B Hip flexor stretch 30s x2 Supine march 15/15 3# SLR 3# 15/15 219ft ambulation holding hands behind back to facilitate upright posture  [facility] Adult PT Treatment:                                                DATE: 02/01/23 Therapeutic Exercise: Nustep L3 6 min Seated hamstring stretch on footstool 30s x2 B Supine QL stretch 30s x2 B Hip flexor stretch 30s x2 Supine march 15/15 3# Step taps to 6 in block  217ft ambulation holding stick behind back to facilitate upright posture  [facility] Adult PT Treatment:                                                DATE: 01/30/23 Therapeutic Exercise: Nustep L2 6 min Seated hamstring stretch on footstool 30s x2 B Supine QL stretch 30s x2 B SKTC 30s x2 B Supine march 15/15 2# 237ft ambulation holding stick behind back to facilitate upright posture   [facility] Adult PT Treatment:                                                DATE: 01/26/23 Therapeutic Exercise: Nustep L2 6 min Seated hamstring stretch on  footstool 30s x2 B Supine QL stretch 30s x2 B Supine march 15/15 276ft ambulation holding stick behind back to facilitate upright posture     [facility] Adult PT Treatment:  DATE: 01/23/23 Therapeutic Exercise: Seated hamstring stretch on footstool 30s x2 B Supine QL stretch 30s x2 B STS arms crossed 5x  Neuromuscular re-ed:   01/23/23 0001  Berg Balance Test  Sit to Stand 4  Standing Unsupported 4  Sitting with Back Unsupported but Feet Supported on Floor or Stool 4  Stand to Sit 4  Transfers 4  Standing Unsupported with Eyes Closed 4  Standing Unsupported with Feet Together 4  From Standing, Reach Forward with Outstretched Arm 4  From Standing Position, Pick up Object from Floor 4  From Standing Position, Turn to Look Behind Over each Shoulder 2  Turn 360 Degrees 2  Standing Unsupported, Alternately Place Feet on Step/Stool 4  Standing Unsupported, One Foot in Front 3  Standing on One Leg 1  Total Score 48   2 MWT 293ft w/o AD                                                                                                                       DATE: 01/17/23 Eval and HEP     PATIENT EDUCATION:  Education details: Discussed eval findings, rehab rationale and POC and patient is in agreement  Person educated: Patient Education method: Explanation Education comprehension: verbalized understanding and needs further education   HOME EXERCISE PROGRAM: Access Code: [email or web address] URL: [email or web address] Date: 01/26/2023 Prepared by: Gustavus Bryant  Exercises - Sit to Stand with Arms Crossed  - 2 x daily - 5 x weekly - 1 sets - 5 reps - Seated Hamstring Stretch  - 2 x daily - 5 x weekly - 1 sets - 2 reps - 30s hold - Supine Quadratus Lumborum Stretch  - 2 x daily - 5 x weekly - 1 sets - 2 reps - 30s hold   ASSESSMENT:   CLINICAL IMPRESSION: Continued to focus on stretching tasks followed by hip and core strengthening.   Also continued proprioceptive and stepping tasks.  Able to demo a more upright posture when walking with occasional festinations observed  Patient is a 77 y.o. male who was seen today for physical therapy evaluation and treatment for general deconditioning combined with a history of Parkinson's. He relates a history of chronic low back pain which also limits activity tolerance.  He is a relatively poor historian regarding previous PT and PMHx and does not have a good understanding of his Parkinson's and cardiac disease processes.  LE weakness noted a well as global stiffness resultant of PD.  30s chair stand test and FOTO demonstrate actual and perceived functional deficits.   OBJECTIVE IMPAIRMENTS: cardiopulmonary status limiting activity, decreased activity tolerance, decreased coordination, decreased endurance, decreased knowledge of condition, decreased mobility, difficulty walking, decreased strength, impaired flexibility, postural dysfunction, pain, and Parkinsonian symptoms .    ACTIVITY LIMITATIONS: carrying, lifting, bending, squatting, stairs, and reach over head   PERSONAL FACTORS: Age, Fitness, Past/current experiences, Time since onset of injury/illness/exacerbation, and 1-2 comorbidities: Parkinsons Disease  are also affecting patient's functional outcome.    REHAB  POTENTIAL: Good   CLINICAL DECISION MAKING: Evolving/moderate complexity   EVALUATION COMPLEXITY: Low     GOALS: Goals reviewed with patient? No   SHORT TERM GOALS: Target date: 03/19/23 Patient to demonstrate independence in HEP  Baseline: [id number] Goal status: INITIAL   2.  226ft ambulation w/o need of AD Baseline: 23ft with slow cadence and festinating gait; 01/23/23 237ft during 2 MWT Goal status: MET   3.  Patient to negotiate 4 steps with handrail assist and most appropriate pattern Baseline: TBD Goal status: INITIAL   4.  Increase FOTO score to 40 Baseline: 30 Goal status: INITIAL   5.  Increase B LE  strength to 4/5 Baseline:  MMT Right eval Left eval  Hip flexion 4- 3+  Hip extension 4- 3+  Hip abduction 4- 3+  Hip adduction      Hip internal rotation      Hip external rotation      Knee flexion 4- 3+  Knee extension 4- 3+  Ankle dorsiflexion      Ankle plantarflexion 4- 3+    Goal status: INITIAL       PLAN:   PT FREQUENCY: 1-2x/week   PT DURATION: 4 weeks   PLANNED INTERVENTIONS: Therapeutic exercises, Therapeutic activity, Neuromuscular re-education, Balance training, Gait training, Patient/Family education, Self Care, Joint mobilization, Stair training, DME instructions, Dry Needling, Manual therapy, and Re-evaluation   PLAN FOR NEXT SESSION: HEP review and update, manual techniques as appropriate, aerobic tasks, ROM and flexibility activities, strengthening and PREs, TPDN, gait and balance training as needed     Hildred Laser, PT 02/01/2023, 5:30 PM

## 2023-02-06 ENCOUNTER — Ambulatory Visit: Payer: 59

## 2023-02-06 ENCOUNTER — Ambulatory Visit (INDEPENDENT_AMBULATORY_CARE_PROVIDER_SITE_OTHER): Payer: 59 | Admitting: Pharmacist

## 2023-02-06 DIAGNOSIS — R5381 Other malaise: Secondary | ICD-10-CM

## 2023-02-06 DIAGNOSIS — G20A1 Parkinson's disease without dyskinesia, without mention of fluctuations: Secondary | ICD-10-CM

## 2023-02-06 DIAGNOSIS — M6281 Muscle weakness (generalized): Secondary | ICD-10-CM

## 2023-02-06 DIAGNOSIS — F411 Generalized anxiety disorder: Secondary | ICD-10-CM

## 2023-02-06 MED ORDER — CITALOPRAM HYDROBROMIDE 20 MG PO TABS
20.0000 mg | ORAL_TABLET | Freq: Every day | ORAL | 2 refills | Status: DC
Start: 2023-02-06 — End: 2023-09-20

## 2023-02-06 NOTE — Progress Notes (Signed)
Pharmacy Note  02/06/2023 Name: Shawn Meza MRN: [id number] DOB: [birth year]  Subjective: Shawn Meza is a 77 y.o. year old male who is a primary care patient of [name], DO. Clinical Pharmacist Practitioner referral was placed to assist with medication management.    Engaged with patient by telephone for follow up visit today.  Medication Management:  Patient has been noted to take medications inconsistently and adjusts therapy himself due to suspected side effects. Adherence has improved over the last 9 months.  Patient is using weekly pill container to help with remembering to take medication.  He also asks for pharmacy to print what medication is for on the label which has been helpful.  He reports that he is now taking Entresto twice a day after last cardiologist visit.  Depression / GAD:  Current therapy: citalopram 20mg  daily Patient report fewer moments of getting upset and angry since starting citalopram  CHF:  Current therapy: taking furosemide 40mg  2 times per week, metoprolol tartrate 25mg  daily and Entresto twice a day  Scheduled to have ECHO 02/25/23 Patient denies shortness of breath, edema or increase in weight.   Parkinson's Disease:  Current therapy - Rytary 3 times a day (patient only taking twice a day)     Objective: Review of patient status, including review of consultants reports, laboratory and other test data, was performed as part of comprehensive.  Lab Results  Component Value Date   CREATININE 1.13 11/28/2022   CREATININE 1.12 11/11/2022   CREATININE 0.96 10/20/2022    Lab Results  Component Value Date   HGBA1C 5.8 11/28/2022       Component Value Date/Time   CHOL 166 11/28/2022 1424   CHOL 163 03/31/2022 1708   TRIG 144.0 11/28/2022 1424   TRIG 107 05/09/2010 0000   HDL 36.70 (L) 11/28/2022 1424   HDL 36 (L) 03/31/2022 1708   CHOLHDL 5 11/28/2022 1424   VLDL 28.8 11/28/2022 1424   LDLCALC 100 (H) 11/28/2022 1424    LDLCALC 91 03/31/2022 1708     Clinical ASCVD: Yes  The ASCVD Risk score (Arnett DK, et al., 2019) failed to calculate for the following reasons:   The patient has a prior MI or stroke diagnosis    BP Readings from Last 3 Encounters:  01/26/23 128/72  12/28/22 136/80  11/28/22 130/64     Allergies  Allergen Reactions   Tizanidine Hcl Hives   Fluoxetine Other (See Comments)    Caused depression and aggression   Rosuvastatin Other (See Comments)    Whole body aches   Testosterone Other (See Comments)    ABDOMINAL PAIN and cramping   Ropinirole Hcl Nausea Only   Requip [Ropinirole] Nausea Only    Medications Reviewed Today     Reviewed by Shawn Meza, RPH-CPP (Pharmacist) on 02/06/23 at 1513  Med List Status: <None>   Medication Order Taking? Sig Documenting Provider Last Dose Status Informant  acetaminophen (TYLENOL) 325 MG tablet [id number] Yes Take 162.5 mg by mouth every 6 (six) hours as needed for mild pain or moderate pain. [provider] Taking Active   aspirin 81 MG EC tablet [id number] No Take 1 tablet (81 mg total) by mouth daily.  Patient not taking: Reported on 02/06/2023   [name], DO Not Taking Active   beclomethasone (QVAR REDIHALER) 40 MCG/ACT inhaler [id number] Yes Inhale 2 puffs into the lungs 2 (two) times daily. [name], DO Taking Active   Carbidopa-Levodopa ER Blenda Peals) 61.25-245  MG CPCR [id number] Yes Take 1 capsule by mouth 3 (three) times daily. [name], DO Taking Active   citalopram (CELEXA) 20 MG tablet [id number] Yes Take 1 tablet (20 mg total) by mouth daily. [name], DO Taking Active   esomeprazole (NEXIUM) 40 MG capsule [id number] Yes Take 1 capsule (40 mg total) by mouth at bedtime.  Patient taking differently: Take 40 mg by mouth at bedtime as needed (indigestion).   [name], DO Taking Active   fluticasone (FLONASE) 50 MCG/ACT nasal spray [id number] Yes Place 2 sprays  into both nostrils daily. [name], DO Taking Active   furosemide (LASIX) 40 MG tablet [id number] Yes Take 20 mg by mouth as needed for fluid or edema (shortness of breath). [provider] Taking Active            Med Note Tiburcio Pea, ABBIE R   Thu Oct 20, 2022  2:21 PM)    gabapentin (NEURONTIN) 100 MG capsule [id number] No Take 1 capsule (100 mg total) by mouth at bedtime.  Patient not taking: Reported on 02/06/2023   [name], DO Not Taking Active   levocetirizine (XYZAL) 5 MG tablet [id number] Yes Take 1 tablet (5 mg total) by mouth every evening. For allergies [name], DO Taking Active   metoprolol tartrate (LOPRESSOR) 25 MG tablet [id number] Yes Take 1 tablet (25 mg total) by mouth daily. Baldo Daub, MD Taking Active   montelukast (SINGULAIR) 10 MG tablet [id number] Yes Take 1 tablet (10 mg total) by mouth at bedtime. [name], DO Taking Active   nitroGLYCERIN (NITROSTAT) 0.4 MG SL tablet [id number] No Place 1 tablet (0.4 mg total) under the tongue every 5 (five) minutes x 3 doses as needed for chest pain. If chest pain is not relieved after 2nd dose - call 911.  Patient not taking: Reported on 02/06/2023   [name], DO Not Taking Active   pravastatin (PRAVACHOL) 20 MG tablet [id number] Yes Take 1 tablet (20 mg total) by mouth daily. [name], DO Taking Active   sacubitril-valsartan (ENTRESTO) 24-26 [place] [id number] Yes Take 1 tablet by mouth 2 (two) times daily. Georgeanna Lea, MD Taking Active             Patient Active Problem List   Diagnosis Date Noted   Major depressive disorder, recurrent episode, moderate (HCC) 06/30/2022   Lumbar spondylosis 06/29/2022   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   Diarrhea 11/04/2020   Rectal bleeding 11/04/2020   Trochanteric bursitis of right hip    Transient ischemic attack    NSTEMI (non-ST elevated myocardial infarction) (HCC)    Nephrolithiasis     Metabolic syndrome    Major depression, chronic    Long-term use of aspirin therapy    Hypercholesterolemia    Hiatal hernia    GERD (gastroesophageal reflux disease)    Essential tremor    Essential hypertension    Erectile dysfunction    Elevated PSA    Colon cancer (HCC)    Chronic coronary artery disease    Arthritis    Myofascial pain 12/20/2019   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   Low back pain 10/28/2019   Radiculopathy, lumbar region 10/28/2019   Fatigue 01/15/2019   Chronic systolic (congestive) heart failure (HCC) 09/18/2018   Ischemic cardiomyopathy 09/16/2018   Aortic regurgitation 09/16/2018   Cardiomyopathy, unspecified (HCC) 06/13/2018   Abscess of right axilla 12/12/2017   BMI 33.0-33.9,adult 12/12/2017   S/P CABG (  coronary artery bypass graft) 11/23/2017   Acute blood loss anemia 10/25/2017   Acute postoperative respiratory insufficiency 10/25/2017   Postoperative delirium 10/25/2017   Dyslipidemia 10/17/2017   Chest pain in adult 10/11/2017   SOB (shortness of breath) 03/31/2017   Long term current use of aspirin 03/29/2017   Parkinson's disease 01/02/2017   Memory change 07/06/2015   Depression, recurrent (HCC) 07/06/2015   Medial meniscus tear 10/11/2011   Neuroma of foot 10/11/2011   Coronary artery disease involving native coronary artery of native heart with angina pectoris (HCC) 12/28/2010   OTHER TESTICULAR HYPOFUNCTION 05/20/2010   Mixed hyperlipidemia 05/20/2010   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/20/2010   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 05/20/2010   GERD 05/20/2010   TRANSIENT ISCHEMIC ATTACK, HX OF 05/20/2010   NEPHROLITHIASIS, HX OF 05/20/2010   BENIGN PROSTATIC HYPERTROPHY, HX OF, S/P TURP 05/20/2010     Medication Assistance:  None required.  Patient affirms current coverage meets needs. (Has [facility] and Medicaid coverage)   Assessment / Plan: CHF / hypertension:   Controlled - will have ECHO later in July 2024.  Continue current medication therapy - metoprolol 25mg  once a day, Entresto twice a day and furosemide once weekly as needed.   Parkinson's: Continue Rytary ER per Dr Don Perking directions.   Depression:  Continue citalopram to 20mg  daily  Medication management:  Reviewed and updated medication list Reviewed refill history and adherence.  Encouraged patient to continue to use weekly pill container to help with medication adherance.  Requested needed Rxs with patient permission - Flonase nasal spray, citalopram, montelukast. Patient also states he was out of aspirin 81mg  - asked pharmacy to put a bottle of aspirin with his other meds.    Follow Up:  2 to 3 months   Shawn Meza, PharmD Clinical Pharmacist [facility]  - [facility] [phone]

## 2023-02-09 ENCOUNTER — Ambulatory Visit: Payer: 59

## 2023-02-09 DIAGNOSIS — M6281 Muscle weakness (generalized): Secondary | ICD-10-CM | POA: Diagnosis not present

## 2023-02-09 DIAGNOSIS — G20A1 Parkinson's disease without dyskinesia, without mention of fluctuations: Secondary | ICD-10-CM

## 2023-02-09 DIAGNOSIS — R5381 Other malaise: Secondary | ICD-10-CM | POA: Diagnosis not present

## 2023-02-09 DIAGNOSIS — R2689 Other abnormalities of gait and mobility: Secondary | ICD-10-CM | POA: Diagnosis not present

## 2023-02-09 DIAGNOSIS — R2681 Unsteadiness on feet: Secondary | ICD-10-CM | POA: Diagnosis not present

## 2023-02-09 DIAGNOSIS — R29818 Other symptoms and signs involving the nervous system: Secondary | ICD-10-CM | POA: Diagnosis not present

## 2023-02-09 NOTE — Therapy (Signed)
OUTPATIENT PHYSICAL THERAPY TREATMENT NOTE/DC SUMMARY   Patient Name: Shawn Meza MRN: [id number] DOB:1945/08/03, 77 y.o., male Today's Date: 02/16/2023  PCP: [name], DO  REFERRING PROVIDER: [name], DO  PHYSICAL THERAPY DISCHARGE SUMMARY  Visits from Start of Care: 7  Current functional level related to goals / functional outcomes: Goals partially met   Remaining deficits: Parkinsonism   Education / Equipment: HEP   Patient agrees to discharge. Patient goals were partially met. Patient is being discharged due to  transferring to a different clinic to address Parkinson's.  END OF SESSION:      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   Diarrhea 11/04/2020   Elevated PSA    Erectile dysfunction    Essential hypertension    Essential tremor    GERD (gastroesophageal reflux disease)    Headache(784.0)    Hiatal hernia    Hypercholesterolemia    Long-term use of aspirin therapy    Low back pain 10/28/2019   Major depression, chronic    Medial meniscus tear 10/11/2011   Metabolic syndrome    Morbid obesity (HCC)    Myofascial pain 12/20/2019   Nephrolithiasis    hx of   NSTEMI (non-ST elevated myocardial infarction) (HCC)    Parkinson's disease    S/P CABG (coronary artery bypass graft)    Transient ischemic attack    hx of   Trochanteric bursitis of right hip    Past Surgical History:  Procedure Laterality Date   CARDIAC CATHETERIZATION  5/12,1/13   4 stents placed   COLON SURGERY     CORONARY ARTERY BYPASS GRAFT     KNEE ARTHROSCOPY  10/11/2011   Procedure: ARTHROSCOPY KNEE;  Surgeon: Nilda Simmer, MD;  Location: [facility];  Service: Orthopedics;  Laterality: Left;  Left Knee Arthroscopy with Medial and Lateral Partial Menisectomy, Chondroplasty   LEFT  HEART CATHETERIZATION WITH CORONARY ANGIOGRAM N/A 08/25/2011   Procedure: LEFT HEART CATHETERIZATION WITH CORONARY ANGIOGRAM;  Surgeon: Kathleene Hazel, MD;  Location: [facility];  Service: Cardiovascular;  Laterality: N/A;   LITHOTRIPSY     STERIOD INJECTION  10/11/2011   Procedure: STEROID INJECTION;  Surgeon: Nilda Simmer, MD;  Location: [facility];  Service: Orthopedics;  Laterality: Right;  Steroid Injection Second Toe   TRANSURETHRAL RESECTION OF PROSTATE     URETHRAL DILATION     Patient Active Problem List   Diagnosis Date Noted   Major depressive disorder, recurrent episode, moderate (HCC) 06/30/2022   Lumbar spondylosis 06/29/2022   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   Diarrhea 11/04/2020   Rectal bleeding 11/04/2020   Trochanteric bursitis of right hip    Transient ischemic attack    NSTEMI (non-ST elevated myocardial infarction) (HCC)    Nephrolithiasis    Metabolic syndrome    Major depression, chronic    Long-term use of aspirin therapy    Hypercholesterolemia    Hiatal hernia    GERD (gastroesophageal reflux disease)    Essential tremor    Essential hypertension    Erectile dysfunction    Elevated PSA    Colon cancer (HCC)    Chronic coronary artery disease    Arthritis    Myofascial pain 12/20/2019   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   Low back pain 10/28/2019  Radiculopathy, lumbar region 10/28/2019   Fatigue 01/15/2019   Chronic systolic (congestive) heart failure (HCC) 09/18/2018   Ischemic cardiomyopathy 09/16/2018   Aortic regurgitation 09/16/2018   Cardiomyopathy, unspecified (HCC) 06/13/2018   Abscess of right axilla 12/12/2017   BMI 33.0-33.9,adult 12/12/2017   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   Acute blood loss anemia 10/25/2017   Acute postoperative respiratory insufficiency 10/25/2017   Postoperative delirium 10/25/2017   Dyslipidemia 10/17/2017   Chest pain in adult 10/11/2017   SOB  (shortness of breath) 03/31/2017   Long term current use of aspirin 03/29/2017   Parkinson's disease 01/02/2017   Memory change 07/06/2015   Depression, recurrent (HCC) 07/06/2015   Medial meniscus tear 10/11/2011   Neuroma of foot 10/11/2011   Coronary artery disease involving native coronary artery of native heart with angina pectoris (HCC) 12/28/2010   OTHER TESTICULAR HYPOFUNCTION 05/20/2010   Mixed hyperlipidemia 05/20/2010   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/20/2010   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 05/20/2010   GERD 05/20/2010   TRANSIENT ISCHEMIC ATTACK, HX OF 05/20/2010   NEPHROLITHIASIS, HX OF 05/20/2010   BENIGN PROSTATIC HYPERTROPHY, HX OF, S/P TURP 05/20/2010    REFERRING DIAG: R53.81 (ICD-10-CM) - Physical deconditioning   THERAPY DIAG:  Muscle weakness (generalized)  Physical deconditioning  Parkinson's disease, unspecified whether dyskinesia present, unspecified whether manifestations fluctuate  Rationale for Evaluation and Treatment Rehabilitation  PERTINENT HISTORY: parkinsons   PRECAUTIONS: Fall   SUBJECTIVE:                                                                                                                                                                                      SUBJECTIVE STATEMENT:  No changes to note.  Has not yet heard back on Parkinson's referral.   PAIN:  Are you having pain? No   OBJECTIVE: (objective measures completed at initial evaluation unless otherwise dated)   DIAGNOSTIC FINDINGS: none   PATIENT SURVEYS:  FOTO 30(40 predicted)   MUSCLE LENGTH: Hamstrings: Right 60 deg; Left 60 deg   POSTURE:  flexed posture   LOWER EXTREMITY ROM: WFL for gait, transfers and bed mobility   Active ROM Right eval Left eval  Hip flexion      Hip extension      Hip abduction      Hip adduction      Hip internal rotation      Hip external rotation      Knee flexion      Knee extension       Ankle dorsiflexion      Ankle plantarflexion      Ankle inversion  Ankle eversion       (Blank rows = not tested)   LOWER EXTREMITY MMT:   MMT Right eval Left eval  Hip flexion 4- 3+  Hip extension 4- 3+  Hip abduction 4- 3+  Hip adduction      Hip internal rotation      Hip external rotation      Knee flexion 4- 3+  Knee extension 4- 3+  Ankle dorsiflexion      Ankle plantarflexion 4- 3+  Ankle inversion      Ankle eversion       (Blank rows = not tested)       FUNCTIONAL TESTS:  5 times sit to stand: N/T 30 seconds chair stand test 7 reps with UE assist   GAIT: Distance walked: 71ft x2 Assistive device utilized: None Level of assistance: Complete Independence Comments: festinating gait with slow cadence     TODAY'S TREATMENT:     [facility] Adult PT Treatment:                                                DATE: 02/06/23 Therapeutic Exercise: Nustep L4 8 min Seated hamstring stretch on footstool 30s x2 B Supine QL stretch 30s x2 B Hip flexor stretch 30s x2 Supine march 15/15 3# SLR 3# 15/15 251ft ambulation holding hands behind back to facilitate upright posture  [facility] Adult PT Treatment:                                                DATE: 02/01/23 Therapeutic Exercise: Nustep L3 6 min Seated hamstring stretch on footstool 30s x2 B Supine QL stretch 30s x2 B Hip flexor stretch 30s x2 Supine march 15/15 3# Step taps to 6 in block  248ft ambulation holding stick behind back to facilitate upright posture  [facility] Adult PT Treatment:                                                DATE: 01/30/23 Therapeutic Exercise: Nustep L2 6 min Seated hamstring stretch on footstool 30s x2 B Supine QL stretch 30s x2 B SKTC 30s x2 B Supine march 15/15 2# 242ft ambulation holding stick behind back to facilitate upright posture   [facility] Adult PT Treatment:                                                DATE: 01/26/23 Therapeutic Exercise: Nustep L2 6 min Seated hamstring  stretch on footstool 30s x2 B Supine QL stretch 30s x2 B Supine march 15/15 233ft ambulation holding stick behind back to facilitate upright posture     [facility] Adult PT Treatment:                                                DATE: 01/23/23 Therapeutic Exercise: Seated hamstring  stretch on footstool 30s x2 B Supine QL stretch 30s x2 B STS arms crossed 5x  Neuromuscular re-ed:   01/23/23 0001  Berg Balance Test  Sit to Stand 4  Standing Unsupported 4  Sitting with Back Unsupported but Feet Supported on Floor or Stool 4  Stand to Sit 4  Transfers 4  Standing Unsupported with Eyes Closed 4  Standing Unsupported with Feet Together 4  From Standing, Reach Forward with Outstretched Arm 4  From Standing Position, Pick up Object from Floor 4  From Standing Position, Turn to Look Behind Over each Shoulder 2  Turn 360 Degrees 2  Standing Unsupported, Alternately Place Feet on Step/Stool 4  Standing Unsupported, One Foot in Front 3  Standing on One Leg 1  Total Score 48   2 MWT 246ft w/o AD                                                                                                                       DATE: 01/17/23 Eval and HEP     PATIENT EDUCATION:  Education details: Discussed eval findings, rehab rationale and POC and patient is in agreement  Person educated: Patient Education method: Explanation Education comprehension: verbalized understanding and needs further education   HOME EXERCISE PROGRAM: Access Code: [email or web address] URL: [email or web address] Date: 01/26/2023 Prepared by: Gustavus Bryant  Exercises - Sit to Stand with Arms Crossed  - 2 x daily - 5 x weekly - 1 sets - 5 reps - Seated Hamstring Stretch  - 2 x daily - 5 x weekly - 1 sets - 2 reps - 30s hold - Supine Quadratus Lumborum Stretch  - 2 x daily - 5 x weekly - 1 sets - 2 reps - 30s hold   ASSESSMENT:   CLINICAL IMPRESSION: Continued to focus on stretching tasks followed by hip and core  strengthening.  Also continued proprioceptive and stepping tasks.  Able to demo a more upright posture when walking with occasional festinations observed  Patient is a 77 y.o. male who was seen today for physical therapy evaluation and treatment for general deconditioning combined with a history of Parkinson's. He relates a history of chronic low back pain which also limits activity tolerance.  He is a relatively poor historian regarding previous PT and PMHx and does not have a good understanding of his Parkinson's and cardiac disease processes.  LE weakness noted a well as global stiffness resultant of PD.  30s chair stand test and FOTO demonstrate actual and perceived functional deficits.   OBJECTIVE IMPAIRMENTS: cardiopulmonary status limiting activity, decreased activity tolerance, decreased coordination, decreased endurance, decreased knowledge of condition, decreased mobility, difficulty walking, decreased strength, impaired flexibility, postural dysfunction, pain, and Parkinsonian symptoms .    ACTIVITY LIMITATIONS: carrying, lifting, bending, squatting, stairs, and reach over head   PERSONAL FACTORS: Age, Fitness, Past/current experiences, Time since onset of injury/illness/exacerbation, and 1-2 comorbidities: Parkinsons Disease  are also affecting patient's functional outcome.    REHAB POTENTIAL: Good   CLINICAL DECISION  MAKING: Evolving/moderate complexity   EVALUATION COMPLEXITY: Low     GOALS: Goals reviewed with patient? No   SHORT TERM GOALS: Target date: 03/19/23 Patient to demonstrate independence in HEP  Baseline: [id number] Goal status: Met   2.  222ft ambulation w/o need of AD Baseline: 2ft with slow cadence and festinating gait; 01/23/23 269ft during 2 MWT Goal status: MET   3.  Patient to negotiate 4 steps with handrail assist and most appropriate pattern Baseline: TBD Goal status: INITIAL   4.  Increase FOTO score to 40 Baseline: 30 Goal status: INITIAL   5.   Increase B LE strength to 4/5 Baseline:  MMT Right eval Left eval  Hip flexion 4- 3+  Hip extension 4- 3+  Hip abduction 4- 3+  Hip adduction      Hip internal rotation      Hip external rotation      Knee flexion 4- 3+  Knee extension 4- 3+  Ankle dorsiflexion      Ankle plantarflexion 4- 3+    Goal status: INITIAL       PLAN:   PT FREQUENCY: 1-2x/week   PT DURATION: 4 weeks   PLANNED INTERVENTIONS: Therapeutic exercises, Therapeutic activity, Neuromuscular re-education, Balance training, Gait training, Patient/Family education, Self Care, Joint mobilization, Stair training, DME instructions, Dry Needling, Manual therapy, and Re-evaluation   PLAN FOR NEXT SESSION: HEP review and update, manual techniques as appropriate, aerobic tasks, ROM and flexibility activities, strengthening and PREs, TPDN, gait and balance training as needed     Hildred Laser, PT 02/16/2023, 3:51 PM

## 2023-02-13 ENCOUNTER — Ambulatory Visit: Payer: 59

## 2023-02-14 ENCOUNTER — Encounter: Payer: Self-pay | Admitting: Physical Therapy

## 2023-02-14 ENCOUNTER — Ambulatory Visit: Payer: 59 | Admitting: Physical Therapy

## 2023-02-14 ENCOUNTER — Other Ambulatory Visit: Payer: Self-pay

## 2023-02-14 DIAGNOSIS — R2689 Other abnormalities of gait and mobility: Secondary | ICD-10-CM

## 2023-02-14 DIAGNOSIS — M6281 Muscle weakness (generalized): Secondary | ICD-10-CM | POA: Diagnosis not present

## 2023-02-14 DIAGNOSIS — R5381 Other malaise: Secondary | ICD-10-CM | POA: Diagnosis not present

## 2023-02-14 DIAGNOSIS — R2681 Unsteadiness on feet: Secondary | ICD-10-CM | POA: Diagnosis not present

## 2023-02-14 DIAGNOSIS — R29818 Other symptoms and signs involving the nervous system: Secondary | ICD-10-CM

## 2023-02-14 DIAGNOSIS — G20A1 Parkinson's disease without dyskinesia, without mention of fluctuations: Secondary | ICD-10-CM | POA: Diagnosis not present

## 2023-02-14 NOTE — Therapy (Signed)
OUTPATIENT PHYSICAL THERAPY NEURO EVALUATION   Patient Name: Shawn Meza MRN: [id number] DOB:10-Dec-1945, 77 y.o., male Today's Date: 02/14/2023   PCP: [name], DO REFERRING PROVIDER: [name], DO   END OF SESSION:  PT End of Session - 02/14/23 1237     Visit Number 1    Number of Visits 13    Date for PT Re-Evaluation 03/31/23    Authorization Type [employer]HC MCR/Medicaid    Progress Note Due on Visit 10    PT Start Time 1234    PT Stop Time 1317    PT Time Calculation (min) 43 min    Activity Tolerance Patient tolerated treatment well    Behavior During Therapy WFL for tasks assessed/performed             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   Diarrhea 11/04/2020   Elevated PSA    Erectile dysfunction    Essential hypertension    Essential tremor    GERD (gastroesophageal reflux disease)    Headache(784.0)    Hiatal hernia    Hypercholesterolemia    Long-term use of aspirin therapy    Low back pain 10/28/2019   Major depression, chronic    Medial meniscus tear 10/11/2011   Metabolic syndrome    Morbid obesity (HCC)    Myofascial pain 12/20/2019   Nephrolithiasis    hx of   NSTEMI (non-ST elevated myocardial infarction) (HCC)    Parkinson's disease    S/P CABG (coronary artery bypass graft)    Transient ischemic attack    hx of   Trochanteric bursitis of right hip    Past Surgical History:  Procedure Laterality Date   CARDIAC CATHETERIZATION  5/12,1/13   4 stents placed   COLON SURGERY     CORONARY ARTERY BYPASS GRAFT     KNEE ARTHROSCOPY  10/11/2011   Procedure: ARTHROSCOPY KNEE;  Surgeon: Nilda Simmer, MD;  Location: [facility];  Service: Orthopedics;  Laterality: Left;  Left Knee Arthroscopy with Medial and Lateral Partial Menisectomy, Chondroplasty    LEFT HEART CATHETERIZATION WITH CORONARY ANGIOGRAM N/A 08/25/2011   Procedure: LEFT HEART CATHETERIZATION WITH CORONARY ANGIOGRAM;  Surgeon: Kathleene Hazel, MD;  Location: [facility] CATH LAB;  Service: Cardiovascular;  Laterality: N/A;   LITHOTRIPSY     STERIOD INJECTION  10/11/2011   Procedure: STEROID INJECTION;  Surgeon: Nilda Simmer, MD;  Location: [facility];  Service: Orthopedics;  Laterality: Right;  Steroid Injection Second Toe   TRANSURETHRAL RESECTION OF PROSTATE     URETHRAL DILATION     Patient Active Problem List   Diagnosis Date Noted   Major depressive disorder, recurrent episode, moderate (HCC) 06/30/2022   Lumbar spondylosis 06/29/2022   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   Diarrhea 11/04/2020   Rectal bleeding 11/04/2020   Trochanteric bursitis of right hip    Transient ischemic attack    NSTEMI (non-ST elevated myocardial infarction) (HCC)    Nephrolithiasis    Metabolic syndrome    Major depression, chronic    Long-term use of aspirin therapy    Hypercholesterolemia    Hiatal hernia    GERD (gastroesophageal reflux disease)    Essential tremor    Essential hypertension    Erectile dysfunction    Elevated PSA  Colon cancer (HCC)    Chronic coronary artery disease    Arthritis    Myofascial pain 12/20/2019   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   Low back pain 10/28/2019   Radiculopathy, lumbar region 10/28/2019   Fatigue 01/15/2019   Chronic systolic (congestive) heart failure (HCC) 09/18/2018   Ischemic cardiomyopathy 09/16/2018   Aortic regurgitation 09/16/2018   Cardiomyopathy, unspecified (HCC) 06/13/2018   Abscess of right axilla 12/12/2017   BMI 33.0-33.9,adult 12/12/2017   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   Acute blood loss anemia 10/25/2017   Acute postoperative respiratory insufficiency 10/25/2017   Postoperative delirium 10/25/2017   Dyslipidemia 10/17/2017   Chest pain in adult 10/11/2017   SOB  (shortness of breath) 03/31/2017   Long term current use of aspirin 03/29/2017   Parkinson's disease 01/02/2017   Memory change 07/06/2015   Depression, recurrent (HCC) 07/06/2015   Medial meniscus tear 10/11/2011   Neuroma of foot 10/11/2011   Coronary artery disease involving native coronary artery of native heart with angina pectoris (HCC) 12/28/2010   OTHER TESTICULAR HYPOFUNCTION 05/20/2010   Mixed hyperlipidemia 05/20/2010   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/20/2010   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 05/20/2010   GERD 05/20/2010   TRANSIENT ISCHEMIC ATTACK, HX OF 05/20/2010   NEPHROLITHIASIS, HX OF 05/20/2010   BENIGN PROSTATIC HYPERTROPHY, HX OF, S/P TURP 05/20/2010    ONSET DATE: 01/27/2023 (MD referral)  REFERRING DIAG: G20.A1 (ICD-10-CM) - Parkinson's disease, unspecified whether dyskinesia present, unspecified whether manifestations fluctuate   THERAPY DIAG:  Other abnormalities of gait and mobility  Unsteadiness on feet  Muscle weakness (generalized)  Other symptoms and signs involving the nervous system  Rationale for Evaluation and Treatment: Rehabilitation  SUBJECTIVE:                                                                                                                                                                                             SUBJECTIVE STATEMENT: Feel like over the past few months, things are getting worse-tremors on R side and L.  Just finished round of therapy at ortho clinic for basic strengthening.  Biggest thing I've been noticing is my balance being off. Pt accompanied by: self  PERTINENT HISTORY: Hx of Parkinson's, CABG x 5, hx of hernias, hx of back pain, sciatica  PAIN:  Are you having pain? Yes: NPRS scale: 8/10 Pain location: L knee Pain description: sore Aggravating factors: unsure/pain switches from one to the other Relieving factors: icy hot  PRECAUTIONS: Fall  RED  FLAGS: None   WEIGHT BEARING RESTRICTIONS: No  FALLS: Has patient fallen  in last 6 months? No  LIVING ENVIRONMENT: Lives with: lives alone Lives in: House/apartment Stairs: No Has following equipment at home: Single point cane  PLOF: Independent, enjoys hitting golf balls  PATIENT GOALS:  To get back into normal activities  play a little golf  OBJECTIVE:   DIAGNOSTIC FINDINGS: NA for this episode  COGNITION: Overall cognitive status: Within functional limits for tasks assessed and hx of depression   MUSCLE TONE: LLE: Mild  POSTURE: No Significant postural limitations, rounded shoulders, and tremors LUE and LLE  LOWER EXTREMITY ROM:     Active  Right Eval Left Eval  Hip flexion    Hip extension    Hip abduction    Hip adduction    Hip internal rotation    Hip external rotation    Knee flexion    Knee extension neutral -5  Ankle dorsiflexion 20 +5  Ankle plantarflexion    Ankle inversion    Ankle eversion     (Blank rows = not tested)  LOWER EXTREMITY MMT:    MMT Right Eval Left Eval  Hip flexion 4 3+  Hip extension    Hip abduction    Hip adduction    Hip internal rotation    Hip external rotation    Knee flexion    Knee extension 4 3+  Ankle dorsiflexion    Ankle plantarflexion    Ankle inversion    Ankle eversion    (Blank rows = not tested)  TRANSFERS: Assistive device utilized: None  Sit to stand: Modified independence Stand to sit: Modified independence   GAIT: Gait pattern: step through pattern, decreased arm swing- Right, decreased step length- Right, decreased trunk rotation, and trunk flexed Distance walked: 50 ft Assistive device utilized: None Level of assistance: SBA Comments: able to improve arm swing with increased speed  FUNCTIONAL TESTS:  5 times sit to stand: 13.03 sec arms crossed at chest Timed up and go (TUG): 17.60 sec 10 meter walk test: 12.28 sec = 2.67 ft/sec MiniBESTEST :  15/28 TUG cognitive:  17.81 sec  delayed start with counting   [facility] PT Assessment - 02/14/23 0001       Standardized Balance Assessment   Standardized Balance Assessment Mini-BESTest      Mini-BESTest   Sit To Stand Normal: Comes to stand without use of hands and stabilizes independently.    Rise to Toes < 3 s.    Stand on one leg (left) Severe: Unable   <1 sec   Stand on one leg (right) Moderate: < 20 s   1-3 sec   Stand on one leg - lowest score 0    Compensatory Stepping Correction - Forward Normal: Recovers independently with a single, large step (second realignement is allowed).    Compensatory Stepping Correction - Backward No step, OR would fall if not caught, OR falls spontaneously.    Compensatory Stepping Correction - Left Lateral Moderate: Several steps to recover equilibrium    Compensatory Stepping Correction - Right Lateral Moderate: Several steps to recover equilibrium    Stepping Corredtion Lateral - lowest score 1    Stance - Feet together, eyes open, firm surface  Normal: 30s    Stance - Feet together, eyes closed, foam surface  Moderate: < 30s   22 sec   Incline - Eyes Closed Normal: Stands independently 30s and aligns with gravity    Change in Gait Speed Moderate: Unable to change walking speed or signs of imbalance    Walk with  head turns - Horizontal Moderate: performs head turns with reduction in gait speed.    Walk with pivot turns Moderate:Turns with feet close SLOW (>4 steps) with good balance.    Step over obstacles Moderate: Steps over box but touches box OR displays cautious behavior by slowing gait.    Timed UP & GO with Dual Task Moderate: Dual Task affects either counting OR walking (>10%) when compared to the TUG without Dual Task.    Mini-BEST total score 15              TODAY'S TREATMENT:                                                                                                                              DATE: 02/14/2023    PATIENT EDUCATION: Education details: Eval  results, POC to address large amplitude movement patterns to address bradykinesia, tremor, Parkinson's symptoms Person educated: Patient Education method: Explanation Education comprehension: verbalized understanding  HOME EXERCISE PROGRAM: Not yet initiated  GOALS: Goals reviewed with patient? Yes  SHORT TERM GOALS: Target date: 03/17/2023  Pt will be independent with Parkinson's specific HEP for improved balance, gait, transfers. Baseline: Goal status: INITIAL  2. Pt will improve TUG score to less than or equal to 13.5 sec for decreased fall risk. Baseline: 117.6 sec Goal status: INITIAL  3.  Pt will improve 5x sit<>stand to less than or equal to 12 sec to demonstrate improved functional strength and transfer efficiency. Baseline: 13.03 sec Goal status: INITIAL  LONG TERM GOALS: Target date: 03/31/2023  Pt will verbalize understanding of local Parkinson's disease community resources, including exercise options. Baseline:  Goal status: INITIAL  2.  Pt will improve TUG cognitive score to less than or equal to 15 sec for decreased fall risk. Baseline: 17.8 sec Goal status: INITIAL  3.  Pt will improve MiniBESTest score to at least 20/28 to decrease fall risk.  Baseline: 15/28 Goal status: INITIAL  4.  Pt will ambulate at least 1000 ft indoor and outdoor surfaces, independently, for improved gait in community. Baseline:  Goal status: INITIAL    ASSESSMENT:  CLINICAL IMPRESSION: Patient is a 77 y.o. male who was seen today for physical therapy evaluation and treatment for Parkinson's disease.  He has just completed bout of therapy to address deconditioning; therapist there recommended more Parkinson's specific therapy to address more Parkinson's deficits.  Pt presents to [facility] with abnormal posture, tremors, bradykinesia, decreased functional strength, decreased balance, decreased timing and coordination of gait.  He is at fall risk per MiniBESTest and TUG measures.  He  reports prolonged time of inactivity, and he is interested in return to community activities and golf.  He will benefit from skilled PT to address the above stated deficits to decrease fall risk and improve functional mobility.  OBJECTIVE IMPAIRMENTS: Abnormal gait, decreased balance, decreased knowledge of use of DME, decreased mobility, difficulty walking, decreased ROM, decreased strength, impaired flexibility, impaired tone, and  postural dysfunction.   ACTIVITY LIMITATIONS: bending, sitting, standing, transfers, reach over head, hygiene/grooming, and locomotion level  PARTICIPATION LIMITATIONS: meal prep, cleaning, laundry, shopping, and community activity  PERSONAL FACTORS: 3+ comorbidities: see above  are also affecting patient's functional outcome.   REHAB POTENTIAL: Good  CLINICAL DECISION MAKING: Evolving/moderate complexity  EVALUATION COMPLEXITY: Moderate  PLAN:  PT FREQUENCY: 2x/week  PT DURATION: 6 weeks plus eval  PLANNED INTERVENTIONS: Therapeutic exercises, Therapeutic activity, Neuromuscular re-education, Balance training, Gait training, Patient/Family education, Self Care, and Manual therapy  PLAN FOR NEXT SESSION: Initiate HEP-PWR! Moves in sitting and standing; work on arm swing and step length with gait-step strategies and compliant surfaces   Bralyn Folkert W., PT 02/14/2023, 3:40 PM  [facility] at [facility] [address] [place], [address] [place], [place] [address] Phone # [phone] Fax # [phone]

## 2023-02-15 ENCOUNTER — Ambulatory Visit: Payer: 59 | Admitting: Psychology

## 2023-02-15 DIAGNOSIS — F331 Major depressive disorder, recurrent, moderate: Secondary | ICD-10-CM
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
## 2023-02-16 ENCOUNTER — Ambulatory Visit: Payer: 59

## 2023-02-20 ENCOUNTER — Ambulatory Visit: Payer: 59 | Admitting: Physical Therapy

## 2023-02-20 ENCOUNTER — Encounter: Payer: Self-pay | Admitting: Physical Therapy

## 2023-02-20 ENCOUNTER — Telehealth: Payer: Self-pay | Admitting: Physical Therapy

## 2023-02-20 DIAGNOSIS — M6281 Muscle weakness (generalized): Secondary | ICD-10-CM

## 2023-02-20 DIAGNOSIS — G20A1 Parkinson's disease without dyskinesia, without mention of fluctuations: Secondary | ICD-10-CM | POA: Diagnosis not present

## 2023-02-20 DIAGNOSIS — R29818 Other symptoms and signs involving the nervous system: Secondary | ICD-10-CM | POA: Diagnosis not present

## 2023-02-20 DIAGNOSIS — R2689 Other abnormalities of gait and mobility: Secondary | ICD-10-CM

## 2023-02-20 DIAGNOSIS — R5381 Other malaise: Secondary | ICD-10-CM | POA: Diagnosis not present

## 2023-02-20 DIAGNOSIS — R2681 Unsteadiness on feet: Secondary | ICD-10-CM

## 2023-02-20 NOTE — Therapy (Signed)
OUTPATIENT PHYSICAL THERAPY NEURO TREATMENT NOTE   Patient Name: Shawn Meza MRN: [id number] DOB:05-Jun-1946, 77 y.o., male Today's Date: 02/20/2023   PCP: [name], DO REFERRING PROVIDER: [name], DO   END OF SESSION:  PT End of Session - 02/20/23 1531     Visit Number 2    Number of Visits 13    Date for PT Re-Evaluation 03/31/23    Authorization Type UHC MCR/Medicaid    Progress Note Due on Visit 10    PT Start Time 1533    PT Stop Time 1615    PT Time Calculation (min) 42 min    Equipment Utilized During Treatment Gait belt    Activity Tolerance Patient tolerated treatment well    Behavior During Therapy WFL for tasks assessed/performed              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   Diarrhea 11/04/2020   Elevated PSA    Erectile dysfunction    Essential hypertension    Essential tremor    GERD (gastroesophageal reflux disease)    Headache(784.0)    Hiatal hernia    Hypercholesterolemia    Long-term use of aspirin therapy    Low back pain 10/28/2019   Major depression, chronic    Medial meniscus tear 10/11/2011   Metabolic syndrome    Morbid obesity (HCC)    Myofascial pain 12/20/2019   Nephrolithiasis    hx of   NSTEMI (non-ST elevated myocardial infarction) (HCC)    Parkinson's disease    S/P CABG (coronary artery bypass graft)    Transient ischemic attack    hx of   Trochanteric bursitis of right hip    Past Surgical History:  Procedure Laterality Date   CARDIAC CATHETERIZATION  5/12,1/13   4 stents placed   COLON SURGERY     CORONARY ARTERY BYPASS GRAFT     KNEE ARTHROSCOPY  10/11/2011   Procedure: ARTHROSCOPY KNEE;  Surgeon: Nilda Simmer, MD;  Location: [facility];  Service: Orthopedics;  Laterality: Left;  Left Knee Arthroscopy with  Medial and Lateral Partial Menisectomy, Chondroplasty   LEFT HEART CATHETERIZATION WITH CORONARY ANGIOGRAM N/A 08/25/2011   Procedure: LEFT HEART CATHETERIZATION WITH CORONARY ANGIOGRAM;  Surgeon: Kathleene Hazel, MD;  Location: [facility] CATH LAB;  Service: Cardiovascular;  Laterality: N/A;   LITHOTRIPSY     STERIOD INJECTION  10/11/2011   Procedure: STEROID INJECTION;  Surgeon: Nilda Simmer, MD;  Location: [facility];  Service: Orthopedics;  Laterality: Right;  Steroid Injection Second Toe   TRANSURETHRAL RESECTION OF PROSTATE     URETHRAL DILATION     Patient Active Problem List   Diagnosis Date Noted   Major depressive disorder, recurrent episode, moderate (HCC) 06/30/2022   Lumbar spondylosis 06/29/2022   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   Diarrhea 11/04/2020   Rectal bleeding 11/04/2020   Trochanteric bursitis of right hip    Transient ischemic attack    NSTEMI (non-ST elevated myocardial infarction) (HCC)    Nephrolithiasis    Metabolic syndrome    Major depression, chronic    Long-term use of aspirin therapy    Hypercholesterolemia    Hiatal hernia    GERD (gastroesophageal reflux disease)    Essential tremor    Essential hypertension  Erectile dysfunction    Elevated PSA    Colon cancer (HCC)    Chronic coronary artery disease    Arthritis    Myofascial pain 12/20/2019   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   Low back pain 10/28/2019   Radiculopathy, lumbar region 10/28/2019   Fatigue 01/15/2019   Chronic systolic (congestive) heart failure (HCC) 09/18/2018   Ischemic cardiomyopathy 09/16/2018   Aortic regurgitation 09/16/2018   Cardiomyopathy, unspecified (HCC) 06/13/2018   Abscess of right axilla 12/12/2017   BMI 33.0-33.9,adult 12/12/2017   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   Acute blood loss anemia 10/25/2017   Acute postoperative respiratory insufficiency 10/25/2017   Postoperative delirium 10/25/2017    Dyslipidemia 10/17/2017   Chest pain in adult 10/11/2017   SOB (shortness of breath) 03/31/2017   Long term current use of aspirin 03/29/2017   Parkinson's disease 01/02/2017   Memory change 07/06/2015   Depression, recurrent (HCC) 07/06/2015   Medial meniscus tear 10/11/2011   Neuroma of foot 10/11/2011   Coronary artery disease involving native coronary artery of native heart with angina pectoris (HCC) 12/28/2010   OTHER TESTICULAR HYPOFUNCTION 05/20/2010   Mixed hyperlipidemia 05/20/2010   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/20/2010   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 05/20/2010   GERD 05/20/2010   TRANSIENT ISCHEMIC ATTACK, HX OF 05/20/2010   NEPHROLITHIASIS, HX OF 05/20/2010   BENIGN PROSTATIC HYPERTROPHY, HX OF, S/P TURP 05/20/2010    ONSET DATE: 01/27/2023 (MD referral)  REFERRING DIAG: G20.A1 (ICD-10-CM) - Parkinson's disease, unspecified whether dyskinesia present, unspecified whether manifestations fluctuate   THERAPY DIAG:  Unsteadiness on feet  Muscle weakness (generalized)  Other abnormalities of gait and mobility  Rationale for Evaluation and Treatment: Rehabilitation  SUBJECTIVE:                                                                                                                                                                                             SUBJECTIVE STATEMENT: Didn't really sleep too much over the weekend.  Never really sleep well. Pt accompanied by: self  PERTINENT HISTORY: Hx of Parkinson's, CABG x 5, hx of hernias, hx of back pain, sciatica  PAIN:  Are you having pain? Yes: NPRS scale: 6/10 Pain location: L knee/varied joints-L shoulder Pain description: sore Aggravating factors: unsure/pain switches from one to the other Relieving factors: icy hot  PRECAUTIONS: Fall  RED FLAGS: None   WEIGHT BEARING RESTRICTIONS: No  FALLS: Has patient fallen in last 6 months? No  LIVING ENVIRONMENT: Lives  with: lives alone Lives in: House/apartment Stairs: No Has following equipment at home: Single  point cane  PLOF: Independent, enjoys hitting golf balls  PATIENT GOALS:  To get back into normal activities  play a little golf  OBJECTIVE:   TODAY'S TREATMENT: 02/20/2023 Activity Comments  Sit<>stand x 5 reps From mat surface, cues for upright posture  Forward/back walking in parallel bars, 4 reps Narrow BOS, decreased LLE clearance  Sidestepping in parallel bars, 4 reps  UE support, cues for increased LLE clearance  Side step over hurdle, 10 reps  BUE support  Forward step over hurdle, 10 reps BUE support  Gait x 3 minutes Cues for increased LLE step length  Seated PWR! Hands:  flick to open 5 reps Decreased elbow extension LUE      Pt performs PWR! Moves in seated position x 8 reps   PWR! Up for improved posture  PWR! Rock for improved weighshifting  PWR! Twist for improved trunk rotation  (modified position for turning to look over shoulder)  PWR! Step for improved step initiation   Cues provided for technique, slow/sustained technique  HOME EXERCISE PROGRAM:  Access Code: [email or web address] URL: [email or web address] Date: 02/20/2023 Prepared by: [facility] - Outpatient  Rehab - [facility]  Program Notes Walk at home 3 minutes, 3 x/day.  Make sure to pay attention to your posture, long step length and relaxed arm swing.  Exercises - Sit to Stand with Hands on Knees  - 1-2 x daily - 7 x weekly - 3 sets - 5 reps  PATIENT EDUCATION: Education details: HEP initiated-see above Person educated: Patient Education method: Programmer, multimedia, Demonstration, Verbal cues, and Handouts Education comprehension: verbalized understanding, returned demonstration, verbal cues required, and needs further education  ----------------------------------------------------------- Objective measures below taken at initial evaluation:  DIAGNOSTIC FINDINGS: NA for this  episode  COGNITION: Overall cognitive status: Within functional limits for tasks assessed and hx of depression   MUSCLE TONE: LLE: Mild  POSTURE: No Significant postural limitations, rounded shoulders, and tremors LUE and LLE  LOWER EXTREMITY ROM:     Active  Right Eval Left Eval  Hip flexion    Hip extension    Hip abduction    Hip adduction    Hip internal rotation    Hip external rotation    Knee flexion    Knee extension neutral -5  Ankle dorsiflexion 20 +5  Ankle plantarflexion    Ankle inversion    Ankle eversion     (Blank rows = not tested)  LOWER EXTREMITY MMT:    MMT Right Eval Left Eval  Hip flexion 4 3+  Hip extension    Hip abduction    Hip adduction    Hip internal rotation    Hip external rotation    Knee flexion    Knee extension 4 3+  Ankle dorsiflexion    Ankle plantarflexion    Ankle inversion    Ankle eversion    (Blank rows = not tested)  TRANSFERS: Assistive device utilized: None  Sit to stand: Modified independence Stand to sit: Modified independence   GAIT: Gait pattern: step through pattern, decreased arm swing- Right, decreased step length- Right, decreased trunk rotation, and trunk flexed Distance walked: 50 ft Assistive device utilized: None Level of assistance: SBA Comments: able to improve arm swing with increased speed  FUNCTIONAL TESTS:  5 times sit to stand: 13.03 sec arms crossed at chest Timed up and go (TUG): 17.60 sec 10 meter walk test: 12.28 sec = 2.67 ft/sec MiniBESTEST :  15/28 TUG cognitive:  17.81 sec delayed start with counting  TODAY'S TREATMENT:                                                                                                                              DATE: 02/14/2023    PATIENT EDUCATION: Education details: Eval results, POC to address large amplitude movement patterns to address bradykinesia, tremor, Parkinson's symptoms Person educated: Patient Education method:  Explanation Education comprehension: verbalized understanding  HOME EXERCISE PROGRAM: Not yet initiated  GOALS: Goals reviewed with patient? Yes  SHORT TERM GOALS: Target date: 03/17/2023  Pt will be independent with Parkinson's specific HEP for improved balance, gait, transfers. Baseline: Goal status: IN PROGRESS  2. Pt will improve TUG score to less than or equal to 13.5 sec for decreased fall risk. Baseline: 117.6 sec Goal status: IN PROGRESS  3.  Pt will improve 5x sit<>stand to less than or equal to 12 sec to demonstrate improved functional strength and transfer efficiency. Baseline: 13.03 sec Goal status: IN PROGRESS  LONG TERM GOALS: Target date: 03/31/2023  Pt will verbalize understanding of local Parkinson's disease community resources, including exercise options. Baseline:  Goal status: IN PROGRESS  2.  Pt will improve TUG cognitive score to less than or equal to 15 sec for decreased fall risk. Baseline: 17.8 sec Goal status: IN PROGRESS  3.  Pt will improve MiniBESTest score to at least 20/28 to decrease fall risk.  Baseline: 15/28 Goal status: IN PROGRESS  4.  Pt will ambulate at least 1000 ft indoor and outdoor surfaces, independently, for improved gait in community. Baseline:  Goal status: IN PROGRESS    ASSESSMENT:  CLINICAL IMPRESSION: Pt presents today for first visit following PT eval; he reports he was somewhat sore from evaluation, but it has subsided.  Initiated session with large amplitude movement patterns to address bradykinesia and LUE/LLE stiffness with seated PWR! Moves.  Pt c/o some shoulder pain in L shoulder, but overall pt able to perform exercises well without c/o.  Worked in standing with stepping and dynamic exercises to promote increased LLE step length and foot clearance.  With cues and repetition, he improves; however, with distraction/dual tasking and conversation, he demo decreased amplitude of LLE movement.  He will continue to  benefit from PT to further address posture, balance, gait and funcitonal strengthening.  *Based on pt's reports of shoulder pain, stiffness, difficulty with fine motor skills and LUE tremor, he would benefit from (and is agreeable to) OT evaluation.    OBJECTIVE IMPAIRMENTS: Abnormal gait, decreased balance, decreased knowledge of use of DME, decreased mobility, difficulty walking, decreased ROM, decreased strength, impaired flexibility, impaired tone, and postural dysfunction.   ACTIVITY LIMITATIONS: bending, sitting, standing, transfers, reach over head, hygiene/grooming, and locomotion level  PARTICIPATION LIMITATIONS: meal prep, cleaning, laundry, shopping, and community activity  PERSONAL FACTORS: 3+ comorbidities: see above  are also affecting patient's functional outcome.   REHAB POTENTIAL: Good  CLINICAL DECISION MAKING: Evolving/moderate complexity  EVALUATION COMPLEXITY: Moderate  PLAN:  PT  FREQUENCY: 2x/week  PT DURATION: 6 weeks plus eval  PLANNED INTERVENTIONS: Therapeutic exercises, Therapeutic activity, Neuromuscular re-education, Balance training, Gait training, Patient/Family education, Self Care, and Manual therapy  PLAN FOR NEXT SESSION: PT to reach out to MD to request OT eval and treat order; Try seated PWR! Moves again and then standing-initiate as appropriate for HEP; work on arm swing and step length with gait-step strategies and compliant surfaces   Elic Vencill W., PT 02/20/2023, 5:13 PM  [facility] at [facility] [address], [address] [place], [place] [address] Phone # [phone] Fax # [phone]

## 2023-02-20 NOTE — Telephone Encounter (Signed)
[name] was evaluated for PT last week to address Parkinson's specific deficits, including posture, balance, and gait.  He demonstrates stiffness and bradykinesia and tremors in Left upper extremity and describes difficulty with L shoulder stiffness and fine motor/coordination activities for ADLs.  He would benefit from occupational therapy to address this.  If you agree, could you please write order for OT eval and treat?  Thank you?  Lonia Blood, PT 02/20/23 5:23 PM Phone: [phone] Fax: [phone]  [facility] at [facility] [address] [place], [address] [place], [place] [address] Phone # [phone] Fax # [phone]

## 2023-02-21 ENCOUNTER — Other Ambulatory Visit: Payer: Self-pay | Admitting: Family Medicine

## 2023-02-21 DIAGNOSIS — G20A1 Parkinson's disease without dyskinesia, without mention of fluctuations: Secondary | ICD-10-CM

## 2023-02-21 NOTE — Telephone Encounter (Signed)
Referral done

## 2023-02-23 ENCOUNTER — Ambulatory Visit: Payer: 59 | Admitting: Physical Therapy

## 2023-02-24 ENCOUNTER — Ambulatory Visit: Payer: 59 | Attending: Cardiology

## 2023-02-24 DIAGNOSIS — I255 Ischemic cardiomyopathy: Secondary | ICD-10-CM | POA: Diagnosis not present

## 2023-02-24 DIAGNOSIS — I25119 Atherosclerotic heart disease of native coronary artery with unspecified angina pectoris: Secondary | ICD-10-CM | POA: Diagnosis not present

## 2023-02-24 LAB — ECHOCARDIOGRAM COMPLETE
Area-P 1/2: 4.29 cm2
Est EF: 30
S' Lateral: 4.55 cm

## 2023-02-24 MED ORDER — PERFLUTREN LIPID MICROSPHERE
1.0000 mL | INTRAVENOUS | Status: AC | PRN
Start: 2023-02-24 — End: 2023-02-24
  Administered 2023-02-24: 6 mL via INTRAVENOUS

## 2023-02-27 NOTE — Therapy (Signed)
OUTPATIENT PHYSICAL THERAPY NEURO TREATMENT NOTE   Patient Name: Shawn Meza MRN: [id number] DOB:Oct 28, 1945, 77 y.o., male Today's Date: 02/28/2023   PCP: [name], DO REFERRING PROVIDER: [name], DO   END OF SESSION:  PT End of Session - 02/28/23 1659     Visit Number 3    Number of Visits 13    Date for PT Re-Evaluation 03/31/23    Authorization Type UHC MCR/Medicaid    Progress Note Due on Visit 10    PT Start Time 1617    PT Stop Time 1702    PT Time Calculation (min) 45 min    Equipment Utilized During Treatment --    Activity Tolerance Patient tolerated treatment well    Behavior During Therapy WFL for tasks assessed/performed               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   Diarrhea 11/04/2020   Elevated PSA    Erectile dysfunction    Essential hypertension    Essential tremor    GERD (gastroesophageal reflux disease)    Headache(784.0)    Hiatal hernia    Hypercholesterolemia    Long-term use of aspirin therapy    Low back pain 10/28/2019   Major depression, chronic    Medial meniscus tear 10/11/2011   Metabolic syndrome    Morbid obesity (HCC)    Myofascial pain 12/20/2019   Nephrolithiasis    hx of   NSTEMI (non-ST elevated myocardial infarction) (HCC)    Parkinson's disease    S/P CABG (coronary artery bypass graft)    Transient ischemic attack    hx of   Trochanteric bursitis of right hip    Past Surgical History:  Procedure Laterality Date   CARDIAC CATHETERIZATION  5/12,1/13   4 stents placed   COLON SURGERY     CORONARY ARTERY BYPASS GRAFT     KNEE ARTHROSCOPY  10/11/2011   Procedure: ARTHROSCOPY KNEE;  Surgeon: Nilda Simmer, MD;  Location: [facility];  Service: Orthopedics;  Laterality: Left;  Left Knee Arthroscopy with  Medial and Lateral Partial Menisectomy, Chondroplasty   LEFT HEART CATHETERIZATION WITH CORONARY ANGIOGRAM N/A 08/25/2011   Procedure: LEFT HEART CATHETERIZATION WITH CORONARY ANGIOGRAM;  Surgeon: Kathleene Hazel, MD;  Location: [facility] CATH LAB;  Service: Cardiovascular;  Laterality: N/A;   LITHOTRIPSY     STERIOD INJECTION  10/11/2011   Procedure: STEROID INJECTION;  Surgeon: Nilda Simmer, MD;  Location: [facility];  Service: Orthopedics;  Laterality: Right;  Steroid Injection Second Toe   TRANSURETHRAL RESECTION OF PROSTATE     URETHRAL DILATION     Patient Active Problem List   Diagnosis Date Noted   Major depressive disorder, recurrent episode, moderate (HCC) 06/30/2022   Lumbar spondylosis 06/29/2022   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   Diarrhea 11/04/2020   Rectal bleeding 11/04/2020   Trochanteric bursitis of right hip    Transient ischemic attack    NSTEMI (non-ST elevated myocardial infarction) (HCC)    Nephrolithiasis    Metabolic syndrome    Major depression, chronic    Long-term use of aspirin therapy    Hypercholesterolemia    Hiatal hernia    GERD (gastroesophageal reflux disease)    Essential tremor    Essential hypertension  Erectile dysfunction    Elevated PSA    Colon cancer (HCC)    Chronic coronary artery disease    Arthritis    Myofascial pain 12/20/2019   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   Low back pain 10/28/2019   Radiculopathy, lumbar region 10/28/2019   Fatigue 01/15/2019   Chronic systolic (congestive) heart failure (HCC) 09/18/2018   Ischemic cardiomyopathy 09/16/2018   Aortic regurgitation 09/16/2018   Cardiomyopathy, unspecified (HCC) 06/13/2018   Abscess of right axilla 12/12/2017   BMI 33.0-33.9,adult 12/12/2017   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   Acute blood loss anemia 10/25/2017   Acute postoperative respiratory insufficiency 10/25/2017   Postoperative delirium 10/25/2017    Dyslipidemia 10/17/2017   Chest pain in adult 10/11/2017   SOB (shortness of breath) 03/31/2017   Long term current use of aspirin 03/29/2017   Parkinson's disease 01/02/2017   Memory change 07/06/2015   Depression, recurrent (HCC) 07/06/2015   Medial meniscus tear 10/11/2011   Neuroma of foot 10/11/2011   Coronary artery disease involving native coronary artery of native heart with angina pectoris (HCC) 12/28/2010   OTHER TESTICULAR HYPOFUNCTION 05/20/2010   Mixed hyperlipidemia 05/20/2010   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/20/2010   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 05/20/2010   GERD 05/20/2010   TRANSIENT ISCHEMIC ATTACK, HX OF 05/20/2010   NEPHROLITHIASIS, HX OF 05/20/2010   BENIGN PROSTATIC HYPERTROPHY, HX OF, S/P TURP 05/20/2010    ONSET DATE: 01/27/2023 (MD referral)  REFERRING DIAG: G20.A1 (ICD-10-CM) - Parkinson's disease, unspecified whether dyskinesia present, unspecified whether manifestations fluctuate   THERAPY DIAG:  Unsteadiness on feet  Muscle weakness (generalized)  Other abnormalities of gait and mobility  Other symptoms and signs involving the nervous system  Rationale for Evaluation and Treatment: Rehabilitation  SUBJECTIVE:                                                                                                                                                                                             SUBJECTIVE STATEMENT: "I'm sore. I think it's for Parkinson's." Reports that in the past he has been worked so hard in PT that he can't come back d/t pain. Requests decreased intensity of session today.   Pt accompanied by: self  PERTINENT HISTORY: Hx of Parkinson's, CABG x 5, hx of hernias, hx of back pain, sciatica  PAIN:  Are you having pain? Yes: NPRS scale: 6/10 Pain location: LB Pain description: sore Aggravating factors: unsure/pain switches from one to the other Relieving factors: icy hot  PRECAUTIONS:  Fall  RED FLAGS: None   WEIGHT BEARING RESTRICTIONS: No  FALLS: Has patient fallen in last 6 months? No  LIVING ENVIRONMENT: Lives with: lives alone Lives in: House/apartment Stairs: No Has following equipment at home: Single point cane  PLOF: Independent, enjoys hitting golf balls  PATIENT GOALS:  To get back into normal activities  play a little golf  OBJECTIVE:     TODAY'S TREATMENT: 02/28/23 Activity Comments  Nustep L4 x 7 min UEs/LEs  Maintaining ~70 SPM  Sitting scapular retraction 10x3" Good tolerance   Sitting pelvic tilts 10x  Cueing to avoid pushing into pain; very rigid spine  Sitting trunk rotation with arms crossed over chest 10x each side  Cueing to stop before point of pain; tolerated well   STS 10x Without UE support; cueing for proper set up     HOME EXERCISE PROGRAM Last updated: 02/28/23 Access Code: [email or web address] URL: [email or web address] Date: 02/28/2023 Prepared by: [facility] - Outpatient  Rehab - [facility]  Program Notes Walk at home 3 minutes, 3 x/day.  Make sure to pay attention to your posture, long step length and relaxed arm swing.  Exercises - Sit to Stand with Hands on Knees  - 1-2 x daily - 7 x weekly - 3 sets - 5 reps - Seated Scapular Retraction  - 1 x daily - 5 x weekly - 2 sets - 10 reps - 3 sec hold - Seated Pelvic Tilt  - 1 x daily - 5 x weekly - 2 sets - 10 reps - Seated Trunk Rotation - Arms Crossed  - 1 x daily - 5 x weekly - 2 sets - 10 reps   PATIENT EDUCATION: Education details: HEP update and review, edu on importance of changing positions through the day, performing HEP in supportive chair rather than recliner  Person educated: Patient Education method: Explanation, Demonstration, Tactile cues, Verbal cues, and Handouts Education comprehension: verbalized understanding and returned demonstration    HOME EXERCISE PROGRAM:  Access Code: [email or web address] URL: [email or web address] Date:  02/20/2023 Prepared by: [facility] - Outpatient  Rehab - [facility]  Program Notes Walk at home 3 minutes, 3 x/day.  Make sure to pay attention to your posture, long step length and relaxed arm swing.  Exercises - Sit to Stand with Hands on Knees  - 1-2 x daily - 7 x weekly - 3 sets - 5 reps   ----------------------------------------------------------- Objective measures below taken at initial evaluation:  DIAGNOSTIC FINDINGS: NA for this episode  COGNITION: Overall cognitive status: Within functional limits for tasks assessed and hx of depression   MUSCLE TONE: LLE: Mild  POSTURE: No Significant postural limitations, rounded shoulders, and tremors LUE and LLE  LOWER EXTREMITY ROM:     Active  Right Eval Left Eval  Hip flexion    Hip extension    Hip abduction    Hip adduction    Hip internal rotation    Hip external rotation    Knee flexion    Knee extension neutral -5  Ankle dorsiflexion 20 +5  Ankle plantarflexion    Ankle inversion    Ankle eversion     (Blank rows = not tested)  LOWER EXTREMITY MMT:    MMT Right Eval Left Eval  Hip flexion 4 3+  Hip extension    Hip abduction    Hip adduction    Hip internal rotation    Hip external rotation    Knee flexion    Knee extension 4 3+  Ankle dorsiflexion    Ankle plantarflexion    Ankle inversion  Ankle eversion    (Blank rows = not tested)  TRANSFERS: Assistive device utilized: None  Sit to stand: Modified independence Stand to sit: Modified independence   GAIT: Gait pattern: step through pattern, decreased arm swing- Right, decreased step length- Right, decreased trunk rotation, and trunk flexed Distance walked: 50 ft Assistive device utilized: None Level of assistance: SBA Comments: able to improve arm swing with increased speed  FUNCTIONAL TESTS:  5 times sit to stand: 13.03 sec arms crossed at chest Timed up and go (TUG): 17.60 sec 10 meter walk test: 12.28 sec = 2.67  ft/sec MiniBESTEST :  15/28 TUG cognitive:  17.81 sec delayed start with counting      TODAY'S TREATMENT:                                                                                                                              DATE: 02/14/2023    PATIENT EDUCATION: Education details: Eval results, POC to address large amplitude movement patterns to address bradykinesia, tremor, Parkinson's symptoms Person educated: Patient Education method: Explanation Education comprehension: verbalized understanding  HOME EXERCISE PROGRAM: Not yet initiated  GOALS: Goals reviewed with patient? Yes  SHORT TERM GOALS: Target date: 03/17/2023  Pt will be independent with Parkinson's specific HEP for improved balance, gait, transfers. Baseline: Goal status: IN PROGRESS  2. Pt will improve TUG score to less than or equal to 13.5 sec for decreased fall risk. Baseline: 117.6 sec Goal status: IN PROGRESS  3.  Pt will improve 5x sit<>stand to less than or equal to 12 sec to demonstrate improved functional strength and transfer efficiency. Baseline: 13.03 sec Goal status: IN PROGRESS  LONG TERM GOALS: Target date: 03/31/2023  Pt will verbalize understanding of local Parkinson's disease community resources, including exercise options. Baseline:  Goal status: IN PROGRESS  2.  Pt will improve TUG cognitive score to less than or equal to 15 sec for decreased fall risk. Baseline: 17.8 sec Goal status: IN PROGRESS  3.  Pt will improve MiniBESTest score to at least 20/28 to decrease fall risk.  Baseline: 15/28 Goal status: IN PROGRESS  4.  Pt will ambulate at least 1000 ft indoor and outdoor surfaces, independently, for improved gait in community. Baseline:  Goal status: IN PROGRESS    ASSESSMENT:  CLINICAL IMPRESSION: Patient arrived to session with report of LBP and requesting decreased intensity of session today. Did not review PWR! Moves d/t pt's report of pain from multiple hernias.  Worked on gentle mobility activities with cueing for form and careful focus on avoiding pushing into pain. Patient tolerated session well. Provided education on importance of gentle activity throughout the day. Patient reported understanding of edu provided and reported no increase in pain upon leaving.   OBJECTIVE IMPAIRMENTS: Abnormal gait, decreased balance, decreased knowledge of use of DME, decreased mobility, difficulty walking, decreased ROM, decreased strength, impaired flexibility, impaired tone, and postural dysfunction.   ACTIVITY LIMITATIONS: bending, sitting, standing, transfers,  reach over head, hygiene/grooming, and locomotion level  PARTICIPATION LIMITATIONS: meal prep, cleaning, laundry, shopping, and community activity  PERSONAL FACTORS: 3+ comorbidities: see above  are also affecting patient's functional outcome.   REHAB POTENTIAL: Good  CLINICAL DECISION MAKING: Evolving/moderate complexity  EVALUATION COMPLEXITY: Moderate  PLAN:  PT FREQUENCY: 2x/week  PT DURATION: 6 weeks plus eval  PLANNED INTERVENTIONS: Therapeutic exercises, Therapeutic activity, Neuromuscular re-education, Balance training, Gait training, Patient/Family education, Self Care, and Manual therapy  PLAN FOR NEXT SESSION: review HEP and assess for tolerance; Try seated PWR! Moves again and then standing-initiate as appropriate for HEP; work on arm swing and step length with gait-step strategies and compliant surfaces    Anette Guarneri, PT, DPT 02/28/23 5:05 PM  [facility] at [facility] [address] [place], [place] [address] Phone # [phone] Fax # [phone]

## 2023-02-28 ENCOUNTER — Ambulatory Visit: Payer: 59 | Attending: Family Medicine | Admitting: Physical Therapy

## 2023-02-28 ENCOUNTER — Encounter: Payer: Self-pay | Admitting: Physical Therapy

## 2023-02-28 DIAGNOSIS — M6281 Muscle weakness (generalized): Secondary | ICD-10-CM | POA: Insufficient documentation

## 2023-02-28 DIAGNOSIS — R2689 Other abnormalities of gait and mobility: Secondary | ICD-10-CM | POA: Insufficient documentation

## 2023-02-28 DIAGNOSIS — R29818 Other symptoms and signs involving the nervous system: Secondary | ICD-10-CM | POA: Insufficient documentation

## 2023-02-28 DIAGNOSIS — R2681 Unsteadiness on feet: Secondary | ICD-10-CM | POA: Diagnosis not present

## 2023-03-01 ENCOUNTER — Ambulatory Visit (INDEPENDENT_AMBULATORY_CARE_PROVIDER_SITE_OTHER): Payer: 59 | Admitting: Psychology

## 2023-03-01 ENCOUNTER — Encounter (INDEPENDENT_AMBULATORY_CARE_PROVIDER_SITE_OTHER): Payer: Self-pay

## 2023-03-01 DIAGNOSIS — F331 Major depressive disorder, recurrent, moderate: Secondary | ICD-10-CM

## 2023-03-01 DIAGNOSIS — F419 Anxiety disorder, unspecified: Secondary | ICD-10-CM | POA: Diagnosis not present
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
## 2023-03-03 ENCOUNTER — Telehealth: Payer: Self-pay

## 2023-03-03 DIAGNOSIS — I25119 Atherosclerotic heart disease of native coronary artery with unspecified angina pectoris: Secondary | ICD-10-CM

## 2023-03-03 MED ORDER — ENTRESTO 49-51 MG PO TABS: 1.0000 | ORAL_TABLET | Freq: Two times a day (BID) | ORAL | 2 refills | Status: DC

## 2023-03-03 NOTE — Telephone Encounter (Signed)
Patient notified of results and recommendations and agreed with plan, medication sent, lab order on file.   

## 2023-03-03 NOTE — Telephone Encounter (Signed)
-----   Message from Gypsy Balsam sent at 03/03/2023  8:41 AM EDT ----- Echocardiogram showed improvement left ventricle ejection fraction though still diminished 30%, please double the dose of Entresto and then Chem-7 need to be done in about a week he is supposed to be on 2426 of Entresto twice daily if that is the case lets go up to 4951 twice daily

## 2023-03-06 ENCOUNTER — Ambulatory Visit: Payer: 59 | Admitting: Physical Therapy

## 2023-03-06 ENCOUNTER — Ambulatory Visit: Payer: 59 | Admitting: Occupational Therapy

## 2023-03-09 ENCOUNTER — Encounter: Payer: Self-pay | Admitting: Physical Therapy

## 2023-03-09 ENCOUNTER — Ambulatory Visit: Payer: 59 | Attending: Family Medicine | Admitting: Physical Therapy

## 2023-03-09 DIAGNOSIS — M6281 Muscle weakness (generalized): Secondary | ICD-10-CM | POA: Insufficient documentation

## 2023-03-09 DIAGNOSIS — R2689 Other abnormalities of gait and mobility: Secondary | ICD-10-CM | POA: Diagnosis not present

## 2023-03-09 DIAGNOSIS — R2681 Unsteadiness on feet: Secondary | ICD-10-CM | POA: Diagnosis not present

## 2023-03-09 NOTE — Therapy (Signed)
OUTPATIENT PHYSICAL THERAPY NEURO TREATMENT NOTE   Patient Name: Shawn Meza MRN: [id number] DOB:09/08/1945, 77 y.o., male Today's Date: 03/09/2023   PCP: [name], DO REFERRING PROVIDER: [name], DO   END OF SESSION:  PT End of Session - 03/09/23 1618     Visit Number 4    Number of Visits 13    Date for PT Re-Evaluation 03/31/23    Authorization Type UHC MCR/Medicaid    Progress Note Due on Visit 10    PT Start Time 1620    PT Stop Time 1700    PT Time Calculation (min) 40 min    Activity Tolerance Patient tolerated treatment well    Behavior During Therapy [facility] for tasks assessed/performed                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   Diarrhea 11/04/2020   Elevated PSA    Erectile dysfunction    Essential hypertension    Essential tremor    GERD (gastroesophageal reflux disease)    Headache(784.0)    Hiatal hernia    Hypercholesterolemia    Long-term use of aspirin therapy    Low back pain 10/28/2019   Major depression, chronic    Medial meniscus tear 10/11/2011   Metabolic syndrome    Morbid obesity (HCC)    Myofascial pain 12/20/2019   Nephrolithiasis    hx of   NSTEMI (non-ST elevated myocardial infarction) (HCC)    Parkinson's disease    S/P CABG (coronary artery bypass graft)    Transient ischemic attack    hx of   Trochanteric bursitis of right hip    Past Surgical History:  Procedure Laterality Date   CARDIAC CATHETERIZATION  5/12,1/13   4 stents placed   COLON SURGERY     CORONARY ARTERY BYPASS GRAFT     KNEE ARTHROSCOPY  10/11/2011   Procedure: ARTHROSCOPY KNEE;  Surgeon: Nilda Simmer, MD;  Location: [facility];  Service: Orthopedics;  Laterality: Left;  Left Knee Arthroscopy with Medial and Lateral Partial Menisectomy,  Chondroplasty   LEFT HEART CATHETERIZATION WITH CORONARY ANGIOGRAM N/A 08/25/2011   Procedure: LEFT HEART CATHETERIZATION WITH CORONARY ANGIOGRAM;  Surgeon: Kathleene Hazel, MD;  Location: [facility] CATH LAB;  Service: Cardiovascular;  Laterality: N/A;   LITHOTRIPSY     STERIOD INJECTION  10/11/2011   Procedure: STEROID INJECTION;  Surgeon: Nilda Simmer, MD;  Location: [facility];  Service: Orthopedics;  Laterality: Right;  Steroid Injection Second Toe   TRANSURETHRAL RESECTION OF PROSTATE     URETHRAL DILATION     Patient Active Problem List   Diagnosis Date Noted   Major depressive disorder, recurrent episode, moderate (HCC) 06/30/2022   Lumbar spondylosis 06/29/2022   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   Diarrhea 11/04/2020   Rectal bleeding 11/04/2020   Trochanteric bursitis of right hip    Transient ischemic attack    NSTEMI (non-ST elevated myocardial infarction) (HCC)    Nephrolithiasis    Metabolic syndrome    Major depression, chronic    Long-term use of aspirin therapy    Hypercholesterolemia    Hiatal hernia    GERD (gastroesophageal reflux disease)    Essential tremor    Essential hypertension    Erectile dysfunction    Elevated  PSA    Colon cancer (HCC)    Chronic coronary artery disease    Arthritis    Myofascial pain 12/20/2019   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   Low back pain 10/28/2019   Radiculopathy, lumbar region 10/28/2019   Fatigue 01/15/2019   Chronic systolic (congestive) heart failure (HCC) 09/18/2018   Ischemic cardiomyopathy 09/16/2018   Aortic regurgitation 09/16/2018   Cardiomyopathy, unspecified (HCC) 06/13/2018   Abscess of right axilla 12/12/2017   BMI 33.0-33.9,adult 12/12/2017   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   Acute blood loss anemia 10/25/2017   Acute postoperative respiratory insufficiency 10/25/2017   Postoperative delirium 10/25/2017   Dyslipidemia 10/17/2017   Chest pain in adult  10/11/2017   SOB (shortness of breath) 03/31/2017   Long term current use of aspirin 03/29/2017   Parkinson's disease 01/02/2017   Memory change 07/06/2015   Depression, recurrent (HCC) 07/06/2015   Medial meniscus tear 10/11/2011   Neuroma of foot 10/11/2011   Coronary artery disease involving native coronary artery of native heart with angina pectoris (HCC) 12/28/2010   OTHER TESTICULAR HYPOFUNCTION 05/20/2010   Mixed hyperlipidemia 05/20/2010   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/20/2010   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 05/20/2010   GERD 05/20/2010   TRANSIENT ISCHEMIC ATTACK, HX OF 05/20/2010   NEPHROLITHIASIS, HX OF 05/20/2010   BENIGN PROSTATIC HYPERTROPHY, HX OF, S/P TURP 05/20/2010    ONSET DATE: 01/27/2023 (MD referral)  REFERRING DIAG: G20.A1 (ICD-10-CM) - Parkinson's disease, unspecified whether dyskinesia present, unspecified whether manifestations fluctuate   THERAPY DIAG:  Unsteadiness on feet  Muscle weakness (generalized)  Other abnormalities of gait and mobility  Rationale for Evaluation and Treatment: Rehabilitation  SUBJECTIVE:                                                                                                                                                                                             SUBJECTIVE STATEMENT: Still  having some soreness in chest; got it checked out, but doctor didn't think they saw anything.   Pt accompanied by: self  PERTINENT HISTORY: Hx of Parkinson's, CABG x 5, hx of hernias, hx of back pain, sciatica  PAIN:  Are you having pain? Yes: NPRS scale: 5/10 Pain location: chest above hernia Pain description: sore Aggravating factors: unsure/pain switches from one to the other Relieving factors: icy hot  PRECAUTIONS: Fall  RED FLAGS: None   WEIGHT BEARING RESTRICTIONS: No  FALLS: Has patient fallen in last 6 months? No  LIVING ENVIRONMENT: Lives with: lives alone Lives in:  House/apartment Stairs: No Has following equipment at home: Single  point cane  PLOF: Independent, enjoys hitting golf balls  PATIENT GOALS:  To get back into normal activities  play a little golf  OBJECTIVE:    TODAY'S TREATMENT: 03/09/2023 Activity Comments  NuStep, Level 4, 4 extremities x 4 min, then BLEs only x 4 min 60-70 SPM  Vitals:  BP  124/85 HR 60   Seated pelvic tilts 10 x 3" Good tolerance  Seated scapular retraction, 10 reps Good tolerance, c/o some tightness at anterior chest  Seated trunk rotation, arms crossed at chest, 5 reps Cues to avoid going into pain-pt reports spasm in low back  STS x 10 reps from mat No UE support  Forward/back walking at counter, 5 reps 1 UE support and cues for widened BOS  Sidestepping, R and L at counter, 3 reps   Gait 85 ft x 2 reps with cues for increased L step length Pt tends to increase pace with gait      HOME EXERCISE PROGRAM Last updated: 02/28/23 Access Code: [email or web address] URL: [email or web address] Date: 02/28/2023 Prepared by: [facility] - Outpatient  Rehab - [facility]  Program Notes Walk at home 3 minutes, 3 x/day.  Make sure to pay attention to your posture, long step length and relaxed arm swing.  Exercises - Sit to Stand with Hands on Knees  - 1-2 x daily - 7 x weekly - 3 sets - 5 reps - Seated Scapular Retraction  - 1 x daily - 5 x weekly - 2 sets - 10 reps - 3 sec hold - Seated Pelvic Tilt  - 1 x daily - 5 x weekly - 2 sets - 10 reps - Seated Trunk Rotation - Arms Crossed  - 1 x daily - 5 x weekly - 2 sets - 10 reps   PATIENT EDUCATION: Education details: HEP update and review, edu on importance of changing positions through the day, performing HEP in supportive chair rather than recliner  Person educated: Patient Education method: Explanation, Demonstration, Tactile cues, Verbal cues, and Handouts Education comprehension: verbalized understanding and returned  demonstration      ----------------------------------------------------------- Objective measures below taken at initial evaluation:  DIAGNOSTIC FINDINGS: NA for this episode  COGNITION: Overall cognitive status: Within functional limits for tasks assessed and hx of depression   MUSCLE TONE: LLE: Mild  POSTURE: No Significant postural limitations, rounded shoulders, and tremors LUE and LLE  LOWER EXTREMITY ROM:     Active  Right Eval Left Eval  Hip flexion    Hip extension    Hip abduction    Hip adduction    Hip internal rotation    Hip external rotation    Knee flexion    Knee extension neutral -5  Ankle dorsiflexion 20 +5  Ankle plantarflexion    Ankle inversion    Ankle eversion     (Blank rows = not tested)  LOWER EXTREMITY MMT:    MMT Right Eval Left Eval  Hip flexion 4 3+  Hip extension    Hip abduction    Hip adduction    Hip internal rotation    Hip external rotation    Knee flexion    Knee extension 4 3+  Ankle dorsiflexion    Ankle plantarflexion    Ankle inversion    Ankle eversion    (Blank rows = not tested)  TRANSFERS: Assistive device utilized: None  Sit to stand: Modified independence Stand to sit: Modified independence   GAIT: Gait pattern: step through pattern, decreased arm swing- Right, decreased  step length- Right, decreased trunk rotation, and trunk flexed Distance walked: 50 ft Assistive device utilized: None Level of assistance: SBA Comments: able to improve arm swing with increased speed  FUNCTIONAL TESTS:  5 times sit to stand: 13.03 sec arms crossed at chest Timed up and go (TUG): 17.60 sec 10 meter walk test: 12.28 sec = 2.67 ft/sec MiniBESTEST :  15/28 TUG cognitive:  17.81 sec delayed start with counting      TODAY'S TREATMENT:                                                                                                                              DATE: 02/14/2023    PATIENT EDUCATION: Education  details: Eval results, POC to address large amplitude movement patterns to address bradykinesia, tremor, Parkinson's symptoms Person educated: Patient Education method: Explanation Education comprehension: verbalized understanding  HOME EXERCISE PROGRAM: Not yet initiated  GOALS: Goals reviewed with patient? Yes  SHORT TERM GOALS: Target date: 03/17/2023  Pt will be independent with Parkinson's specific HEP for improved balance, gait, transfers. Baseline: Goal status: IN PROGRESS  2. Pt will improve TUG score to less than or equal to 13.5 sec for decreased fall risk. Baseline: 117.6 sec Goal status: IN PROGRESS  3.  Pt will improve 5x sit<>stand to less than or equal to 12 sec to demonstrate improved functional strength and transfer efficiency. Baseline: 13.03 sec Goal status: IN PROGRESS  LONG TERM GOALS: Target date: 03/31/2023  Pt will verbalize understanding of local Parkinson's disease community resources, including exercise options. Baseline:  Goal status: IN PROGRESS  2.  Pt will improve TUG cognitive score to less than or equal to 15 sec for decreased fall risk. Baseline: 17.8 sec Goal status: IN PROGRESS  3.  Pt will improve MiniBESTest score to at least 20/28 to decrease fall risk.  Baseline: 15/28 Goal status: IN PROGRESS  4.  Pt will ambulate at least 1000 ft indoor and outdoor surfaces, independently, for improved gait in community. Baseline:  Goal status: IN PROGRESS    ASSESSMENT:  CLINICAL IMPRESSION: Skilled PT session continues to focus on exercises for flexibility, strengthening.  Pt feels pleased about being able to perform sit<>stand without UE support.  He reports that he tried the exercises at home and feels that he may have overdone it.  Reviewed HEP and continued to reinforce not overdoing exercises beyond point of pain or discomfort.  Pt does report that he feels better overall at end of session, no complaints. OBJECTIVE IMPAIRMENTS: Abnormal  gait, decreased balance, decreased knowledge of use of DME, decreased mobility, difficulty walking, decreased ROM, decreased strength, impaired flexibility, impaired tone, and postural dysfunction.   ACTIVITY LIMITATIONS: bending, sitting, standing, transfers, reach over head, hygiene/grooming, and locomotion level  PARTICIPATION LIMITATIONS: meal prep, cleaning, laundry, shopping, and community activity  PERSONAL FACTORS: 3+ comorbidities: see above  are also affecting patient's functional outcome.   REHAB POTENTIAL: Good  CLINICAL DECISION MAKING: Evolving/moderate  complexity  EVALUATION COMPLEXITY: Moderate  PLAN:  PT FREQUENCY: 2x/week  PT DURATION: 6 weeks plus eval  PLANNED INTERVENTIONS: Therapeutic exercises, Therapeutic activity, Neuromuscular re-education, Balance training, Gait training, Patient/Family education, Self Care, and Manual therapy  PLAN FOR NEXT SESSION: Seated and standing strengthening, balance exercises; step strategies, compliant surfaces    Lonia Blood, PT 03/09/23 5:05 PM Phone: [phone] Fax: [phone]   [facility] at [facility] [address], [address] [place], [place] [address] Phone # [phone] Fax # [phone]

## 2023-03-13 ENCOUNTER — Ambulatory Visit: Payer: 59 | Admitting: Physical Therapy

## 2023-03-15 ENCOUNTER — Ambulatory Visit: Payer: 59 | Admitting: Physical Therapy

## 2023-03-15 ENCOUNTER — Ambulatory Visit: Payer: Medicare Other | Admitting: Psychology

## 2023-03-15 DIAGNOSIS — R2689 Other abnormalities of gait and mobility: Secondary | ICD-10-CM | POA: Diagnosis not present

## 2023-03-15 DIAGNOSIS — R2681 Unsteadiness on feet: Secondary | ICD-10-CM

## 2023-03-15 DIAGNOSIS — M6281 Muscle weakness (generalized): Secondary | ICD-10-CM

## 2023-03-15 NOTE — Therapy (Signed)
OUTPATIENT PHYSICAL THERAPY NEURO TREATMENT NOTE   Patient Name: Shawn Meza MRN: [id number] DOB:April 22, 1946, 77 y.o., male Today's Date: 03/16/2023   PCP: [name], DO REFERRING PROVIDER: [name], DO   END OF SESSION:  PT End of Session - 03/15/23 1705     Visit Number 5    Number of Visits 13    Date for PT Re-Evaluation 03/31/23    Authorization Type UHC MCR/Medicaid    Progress Note Due on Visit 10    PT Start Time 1627    PT Stop Time 1706    PT Time Calculation (min) 39 min    Activity Tolerance Patient tolerated treatment well    Behavior During Therapy WFL for tasks assessed/performed                 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   Diarrhea 11/04/2020   Elevated PSA    Erectile dysfunction    Essential hypertension    Essential tremor    GERD (gastroesophageal reflux disease)    Headache(784.0)    Hiatal hernia    Hypercholesterolemia    Long-term use of aspirin therapy    Low back pain 10/28/2019   Major depression, chronic    Medial meniscus tear 10/11/2011   Metabolic syndrome    Morbid obesity (HCC)    Myofascial pain 12/20/2019   Nephrolithiasis    hx of   NSTEMI (non-ST elevated myocardial infarction) (HCC)    Parkinson's disease    S/P CABG (coronary artery bypass graft)    Transient ischemic attack    hx of   Trochanteric bursitis of right hip    Past Surgical History:  Procedure Laterality Date   CARDIAC CATHETERIZATION  5/12,1/13   4 stents placed   COLON SURGERY     CORONARY ARTERY BYPASS GRAFT     KNEE ARTHROSCOPY  10/11/2011   Procedure: ARTHROSCOPY KNEE;  Surgeon: Nilda Simmer, MD;  Location: [facility];  Service: Orthopedics;  Laterality: Left;  Left Knee Arthroscopy with Medial and Lateral Partial Menisectomy,  Chondroplasty   LEFT HEART CATHETERIZATION WITH CORONARY ANGIOGRAM N/A 08/25/2011   Procedure: LEFT HEART CATHETERIZATION WITH CORONARY ANGIOGRAM;  Surgeon: Kathleene Hazel, MD;  Location: [facility] CATH LAB;  Service: Cardiovascular;  Laterality: N/A;   LITHOTRIPSY     STERIOD INJECTION  10/11/2011   Procedure: STEROID INJECTION;  Surgeon: Nilda Simmer, MD;  Location: [facility];  Service: Orthopedics;  Laterality: Right;  Steroid Injection Second Toe   TRANSURETHRAL RESECTION OF PROSTATE     URETHRAL DILATION     Patient Active Problem List   Diagnosis Date Noted   Major depressive disorder, recurrent episode, moderate (HCC) 06/30/2022   Lumbar spondylosis 06/29/2022   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   Diarrhea 11/04/2020   Rectal bleeding 11/04/2020   Trochanteric bursitis of right hip    Transient ischemic attack    NSTEMI (non-ST elevated myocardial infarction) (HCC)    Nephrolithiasis    Metabolic syndrome    Major depression, chronic    Long-term use of aspirin therapy    Hypercholesterolemia    Hiatal hernia    GERD (gastroesophageal reflux disease)    Essential tremor    Essential hypertension    Erectile dysfunction  Elevated PSA    Colon cancer (HCC)    Chronic coronary artery disease    Arthritis    Myofascial pain 12/20/2019   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   Low back pain 10/28/2019   Radiculopathy, lumbar region 10/28/2019   Fatigue 01/15/2019   Chronic systolic (congestive) heart failure (HCC) 09/18/2018   Ischemic cardiomyopathy 09/16/2018   Aortic regurgitation 09/16/2018   Cardiomyopathy, unspecified (HCC) 06/13/2018   Abscess of right axilla 12/12/2017   BMI 33.0-33.9,adult 12/12/2017   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   Acute blood loss anemia 10/25/2017   Acute postoperative respiratory insufficiency 10/25/2017   Postoperative delirium 10/25/2017   Dyslipidemia 10/17/2017   Chest pain in adult  10/11/2017   SOB (shortness of breath) 03/31/2017   Long term current use of aspirin 03/29/2017   Parkinson's disease 01/02/2017   Memory change 07/06/2015   Depression, recurrent (HCC) 07/06/2015   Medial meniscus tear 10/11/2011   Neuroma of foot 10/11/2011   Coronary artery disease involving native coronary artery of native heart with angina pectoris (HCC) 12/28/2010   OTHER TESTICULAR HYPOFUNCTION 05/20/2010   Mixed hyperlipidemia 05/20/2010   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/20/2010   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 05/20/2010   GERD 05/20/2010   TRANSIENT ISCHEMIC ATTACK, HX OF 05/20/2010   NEPHROLITHIASIS, HX OF 05/20/2010   BENIGN PROSTATIC HYPERTROPHY, HX OF, S/P TURP 05/20/2010    ONSET DATE: 01/27/2023 (MD referral)  REFERRING DIAG: G20.A1 (ICD-10-CM) - Parkinson's disease, unspecified whether dyskinesia present, unspecified whether manifestations fluctuate   THERAPY DIAG:  Unsteadiness on feet  Muscle weakness (generalized)  Other abnormalities of gait and mobility  Rationale for Evaluation and Treatment: Rehabilitation  SUBJECTIVE:                                                                                                                                                                                             SUBJECTIVE STATEMENT: Reports feeling somewhat better.  Feel jittery today.   Pt accompanied by: self  PERTINENT HISTORY: Hx of Parkinson's, CABG x 5, hx of hernias, hx of back pain, sciatica  PAIN:  Are you having pain? Yes: NPRS scale: 5/10 Pain location: chest above hernia Pain description: sore Aggravating factors: unsure/pain switches from one to the other Relieving factors: icy hot  PRECAUTIONS: Fall  RED FLAGS: None   WEIGHT BEARING RESTRICTIONS: No  FALLS: Has patient fallen in last 6 months? No  LIVING ENVIRONMENT: Lives with: lives alone Lives in: House/apartment Stairs: No Has following  equipment at home: Single point cane  PLOF: Independent, enjoys hitting golf balls  PATIENT GOALS:  To get back into normal activities  play a little golf  OBJECTIVE:    TODAY'S TREATMENT: 03/15/2023 Activity Comments  Vitals 141/72   NuStep, Level 4, 4 extremities x 10 minutes SPM >75, 30 sec bouts of >90-100  5x sit<>Stand:  14.03 sec, 13.38 sec   TUG:15.09 Compared to 17.6 sec  Reviewed HEP and pt return demo understanding Verbally said to d/c trunk rotation ex  Standing balance activities to increase step height and foot clearance: -side step and weightshift x 10 reps -forward step and weightshift x 10 reps -alt step taps to low obstacle Step over obstacle, BUE support  Forward/back walking at parallel bars 5 reps   Sidestepping at parallel bars 3 reps R and L   Gait x 3 laps in gym (85 ft x 3) with supervision Cues for increased LLE step length, but not increased speed of gait, cues for relaxed arm swing      HOME EXERCISE PROGRAM Last updated: 02/28/23 Access Code: [email or web address] URL: [email or web address] Date: 02/28/2023 Prepared by: [facility]  Program Notes Walk at home 3 minutes, 3 x/day.  Make sure to pay attention to your posture, long step length and relaxed arm swing.  Exercises - Sit to Stand with Hands on Knees  - 1-2 x daily - 7 x weekly - 3 sets - 5 reps - Seated Scapular Retraction  - 1 x daily - 5 x weekly - 2 sets - 10 reps - 3 sec hold - Seated Pelvic Tilt  - 1 x daily - 5 x weekly - 2 sets - 10 reps - Seated Trunk Rotation - Arms Crossed  - 1 x daily - 5 x weekly - 2 sets - 10 reps   PATIENT EDUCATION: Education details: HEP update and review, edu on importance of changing positions through the day, performing HEP in supportive chair rather than recliner  Person educated: Patient Education method: Explanation, Demonstration, Tactile cues, Verbal cues, and Handouts Education comprehension: verbalized  understanding and returned demonstration      ----------------------------------------------------------- Objective measures below taken at initial evaluation:  DIAGNOSTIC FINDINGS: NA for this episode  COGNITION: Overall cognitive status: Within functional limits for tasks assessed and hx of depression   MUSCLE TONE: LLE: Mild  POSTURE: No Significant postural limitations, rounded shoulders, and tremors LUE and LLE  LOWER EXTREMITY ROM:     Active  Right Eval Left Eval  Hip flexion    Hip extension    Hip abduction    Hip adduction    Hip internal rotation    Hip external rotation    Knee flexion    Knee extension neutral -5  Ankle dorsiflexion 20 +5  Ankle plantarflexion    Ankle inversion    Ankle eversion     (Blank rows = not tested)  LOWER EXTREMITY MMT:    MMT Right Eval Left Eval  Hip flexion 4 3+  Hip extension    Hip abduction    Hip adduction    Hip internal rotation    Hip external rotation    Knee flexion    Knee extension 4 3+  Ankle dorsiflexion    Ankle plantarflexion    Ankle inversion    Ankle eversion    (Blank rows = not tested)  TRANSFERS: Assistive device utilized: None  Sit to stand: Modified independence Stand to sit: Modified independence   GAIT: Gait pattern: step through pattern, decreased arm swing- Right, decreased  step length- Right, decreased trunk rotation, and trunk flexed Distance walked: 50 ft Assistive device utilized: None Level of assistance: SBA Comments: able to improve arm swing with increased speed  FUNCTIONAL TESTS:  5 times sit to stand: 13.03 sec arms crossed at chest Timed up and go (TUG): 17.60 sec 10 meter walk test: 12.28 sec = 2.67 ft/sec MiniBESTEST :  15/28 TUG cognitive:  17.81 sec delayed start with counting      TODAY'S TREATMENT:                                                                                                                              DATE: 02/14/2023    PATIENT  EDUCATION: Education details: Eval results, POC to address large amplitude movement patterns to address bradykinesia, tremor, Parkinson's symptoms Person educated: Patient Education method: Explanation Education comprehension: verbalized understanding  HOME EXERCISE PROGRAM: Not yet initiated  GOALS: Goals reviewed with patient? Yes  SHORT TERM GOALS: Target date: 03/17/2023  Pt will be independent with Parkinson's specific HEP for improved balance, gait, transfers. Baseline: Goal status: GOAL MET, 03/15/2023  2. Pt will improve TUG score to less than or equal to 13.5 sec for decreased fall risk. Baseline: 17.6 sec>15.09 sec Goal status: GOAL PARTIALLY MET 03/15/2023  3.  Pt will improve 5x sit<>stand to less than or equal to 12 sec to demonstrate improved functional strength and transfer efficiency. Baseline: 13.03 sec Goal status: NOT MET, 03/15/2023  LONG TERM GOALS: Target date: 03/31/2023  Pt will verbalize understanding of local Parkinson's disease community resources, including exercise options. Baseline:  Goal status: IN PROGRESS  2.  Pt will improve TUG cognitive score to less than or equal to 15 sec for decreased fall risk. Baseline: 17.8 sec Goal status: IN PROGRESS  3.  Pt will improve MiniBESTest score to at least 20/28 to decrease fall risk.  Baseline: 15/28 Goal status: IN PROGRESS  4.  Pt will ambulate at least 1000 ft indoor and outdoor surfaces, independently, for improved gait in community. Baseline:  Goal status: IN PROGRESS    ASSESSMENT:  CLINICAL IMPRESSION: Assessed pt's STGs, with pt meeting 1 of 3 STGs.  STG 1 met, STG 2 partially met, STG 3 not met.  Pt has demo improved score on TUG, just not to goal level.  Pt performs FTSTS test well, with improved eccentric control, but time has not improved since eval.  He has missed several appointments in POC thus far, due to medical issues and not feeling well, and this may be contributing to less robust  progress towards STGs.  He is agreeable to continuing per POC to further upgrade HEP (has been difficult, as traditional and modified PWR! Moves exercises have caused a great deal of pain for hernias), and to improve functional strength and balance towards LTGs for improved overall functional mobility.   OBJECTIVE IMPAIRMENTS: Abnormal gait, decreased balance, decreased knowledge of use of DME, decreased mobility, difficulty walking, decreased  ROM, decreased strength, impaired flexibility, impaired tone, and postural dysfunction.   ACTIVITY LIMITATIONS: bending, sitting, standing, transfers, reach over head, hygiene/grooming, and locomotion level  PARTICIPATION LIMITATIONS: meal prep, cleaning, laundry, shopping, and community activity  PERSONAL FACTORS: 3+ comorbidities: see above  are also affecting patient's functional outcome.   REHAB POTENTIAL: Good  CLINICAL DECISION MAKING: Evolving/moderate complexity  EVALUATION COMPLEXITY: Moderate  PLAN:  PT FREQUENCY: 2x/week  PT DURATION: 6 weeks plus eval  PLANNED INTERVENTIONS: Therapeutic exercises, Therapeutic activity, Neuromuscular re-education, Balance training, Gait training, Patient/Family education, Self Care, and Manual therapy  PLAN FOR NEXT SESSION: Progress HEP for strength and balance; continue seated and standing strengthening, balance exercises; step strategies, compliant surfaces, towards Britta Mccreedy, PT 03/16/23 7:49 AM Phone: [phone] Fax: [phone]   [facility] at [facility] [address], [address] [place], [place] [address] Phone # [phone] Fax # [phone]

## 2023-03-16 ENCOUNTER — Encounter (INDEPENDENT_AMBULATORY_CARE_PROVIDER_SITE_OTHER): Payer: Self-pay

## 2023-03-16 ENCOUNTER — Ambulatory Visit: Payer: 59 | Admitting: Physical Therapy

## 2023-03-16 ENCOUNTER — Encounter: Payer: Self-pay | Admitting: Physical Therapy

## 2023-03-20 ENCOUNTER — Ambulatory Visit: Payer: 59 | Admitting: Physical Therapy

## 2023-03-23 ENCOUNTER — Ambulatory Visit: Payer: 59 | Admitting: Physical Therapy

## 2023-03-29 ENCOUNTER — Ambulatory Visit: Payer: Medicare Other | Admitting: Psychology

## 2023-04-04 ENCOUNTER — Ambulatory Visit: Payer: 59 | Admitting: Occupational Therapy

## 2023-04-04 ENCOUNTER — Ambulatory Visit: Payer: 59 | Admitting: Physical Therapy

## 2023-04-05 ENCOUNTER — Ambulatory Visit (HOSPITAL_BASED_OUTPATIENT_CLINIC_OR_DEPARTMENT_OTHER)
Admission: RE | Admit: 2023-04-05 | Discharge: 2023-04-05 | Disposition: A | Discharge status: Home / Self Care | Payer: 59 | Source: Ambulatory Visit | Attending: Physician Assistant | Admitting: Physician Assistant

## 2023-04-05 ENCOUNTER — Encounter: Payer: 59 | Admitting: *Deleted

## 2023-04-05 ENCOUNTER — Ambulatory Visit (INDEPENDENT_AMBULATORY_CARE_PROVIDER_SITE_OTHER): Payer: 59 | Admitting: Physician Assistant

## 2023-04-05 ENCOUNTER — Encounter: Payer: Self-pay | Admitting: Physician Assistant

## 2023-04-05 VITALS — BP 123/68 | HR 52 | Temp 97.7°F | Ht 67.0 in | Wt 203.4 lb

## 2023-04-05 DIAGNOSIS — R5383 Other fatigue: Secondary | ICD-10-CM | POA: Diagnosis not present

## 2023-04-05 DIAGNOSIS — I517 Cardiomegaly: Secondary | ICD-10-CM | POA: Diagnosis not present

## 2023-04-05 DIAGNOSIS — R0689 Other abnormalities of breathing: Secondary | ICD-10-CM | POA: Insufficient documentation

## 2023-04-05 DIAGNOSIS — R059 Cough, unspecified: Secondary | ICD-10-CM | POA: Diagnosis not present

## 2023-04-05 NOTE — Progress Notes (Signed)
Pt in for wellness today but is sick so we canceled appt. This encounter was created in error - please disregard.

## 2023-04-05 NOTE — Progress Notes (Signed)
Established patient visit   Patient: Shawn Meza   DOB: [birth year]   77 y.o. Male  MRN: [id number] Visit Date: 04/05/2023  Today's healthcare provider: Alfredia Ferguson, PA-C   Cc. Weakness, fatigue  Subjective    HPI  Pt with a pmh of CHF, HTN, parkinsons, reports with about one week of fatigue, cough, weakness. Reports [place] which was [place]ing, recalls getting a speeding ticket which was stressful.  Since he has been back, overall fatigue, cough -- which is acute on chronic is slightly productive. Denies other URI symptoms. Reports chronic chest pain 2/2 hernia and chronic b/l arm numbness. He is never sure when these symptoms are exacerbated.   He reports feeling dehydrated. He also states he never started the new entresto medication. Medications: Outpatient Medications Prior to Visit  Medication Sig   acetaminophen (TYLENOL) 325 MG tablet Take 162.5 mg by mouth every 6 (six) hours as needed for mild pain or moderate pain.   aspirin 81 MG EC tablet Take 1 tablet (81 mg total) by mouth daily.   beclomethasone (QVAR REDIHALER) 40 MCG/ACT inhaler Inhale 2 puffs into the lungs 2 (two) times daily.   Carbidopa-Levodopa ER (RYTARY) 61.25-245 MG CPCR Take 1 capsule by mouth 3 (three) times daily.   citalopram (CELEXA) 20 MG tablet Take 1 tablet (20 mg total) by mouth daily.   esomeprazole (NEXIUM) 40 MG capsule Take 1 capsule (40 mg total) by mouth at bedtime. (Patient taking differently: Take 40 mg by mouth at bedtime as needed (indigestion).)   fluticasone (FLONASE) 50 MCG/ACT nasal spray Place 2 sprays into both nostrils daily.   furosemide (LASIX) 40 MG tablet Take 20 mg by mouth as needed for fluid or edema (shortness of breath).   gabapentin (NEURONTIN) 100 MG capsule Take 1 capsule (100 mg total) by mouth at bedtime.   levocetirizine (XYZAL) 5 MG tablet Take 1 tablet (5 mg total) by mouth every evening. For allergies   metoprolol  tartrate (LOPRESSOR) 25 MG tablet Take 1 tablet (25 mg total) by mouth daily.   montelukast (SINGULAIR) 10 MG tablet Take 1 tablet (10 mg total) by mouth at bedtime.   nitroGLYCERIN (NITROSTAT) 0.4 MG SL tablet Place 1 tablet (0.4 mg total) under the tongue every 5 (five) minutes x 3 doses as needed for chest pain. If chest pain is not relieved after 2nd dose - call 911.   pravastatin (PRAVACHOL) 20 MG tablet Take 1 tablet (20 mg total) by mouth daily.   sacubitril-valsartan (ENTRESTO) 49-51 MG Take 1 tablet by mouth 2 (two) times daily.   No facility-administered medications prior to visit.    Review of Systems  Constitutional:  Positive for fatigue. Negative for fever.  Respiratory:  Positive for cough. Negative for shortness of breath.   Cardiovascular:  Negative for chest pain, palpitations and leg swelling.  Neurological:  Positive for weakness. Negative for dizziness and headaches.      Objective    BP 123/68   Pulse (!) 52   Temp 97.7 F (36.5 C)   Ht 5\' 7"  (1.702 m)   Wt 203 lb 6.4 oz (92.3 kg)   SpO2 95%   BMI 31.86 kg/m   Physical Exam Constitutional:      General: He is awake.     Appearance: He is well-developed.  HENT:     Head: Normocephalic.  Eyes:     Conjunctiva/sclera: Conjunctivae normal.  Cardiovascular:  Rate and Rhythm: Normal rate and regular rhythm.     Heart sounds: Normal heart sounds.  Pulmonary:     Effort: Pulmonary effort is normal.     Comments: Crackles bilaterally Skin:    General: Skin is warm.  Neurological:     Mental Status: He is alert and oriented to person, place, and time.  Psychiatric:        Attention and Perception: Attention normal.        Mood and Affect: Mood normal.        Speech: Speech normal.        Behavior: Behavior is cooperative.      No results found for any visits on 04/05/23.  Assessment & Plan     1. Other fatigue vitals are wnl, pt is slightly brady today which is not his usual  EKG today  appears stable from 3/24   - Comp Met (CMET) -BNP - EKG 12-Lead  2. Abnormal breath sounds Crackles b/l stat cxr ordered r/o CHF exacerbation vs pneumonia  -BNP - DG Chest 2 View; Future   Encouraged hydration-- adding salt, gatorade, pedialyte.  Strict ED guidelines regarding symptoms- advised if he develops fever, weakness progresses to call 911/go to ED. He does not wish to go today from the office.  Return if symptoms worsen or fail to improve.      I, Alfredia Ferguson, PA-C have reviewed all documentation for this visit. The documentation on  04/05/23   for the exam, diagnosis, procedures, and orders are all accurate and complete.    Alfredia Ferguson, PA-C  [facility] at [facility] [phone] (phone) [phone] (fax)  [facility]

## 2023-04-06 LAB — COMPREHENSIVE METABOLIC PANEL
ALT: 20 U/L (ref 0–53)
AST: 20 U/L (ref 0–37)
Albumin: 4.2 g/dL (ref 3.5–5.2)
Alkaline Phosphatase: 65 U/L (ref 39–117)
BUN: 19 mg/dL (ref 6–23)
CO2: 22 meq/L (ref 19–32)
Calcium: 9.3 mg/dL (ref 8.4–10.5)
Chloride: 105 meq/L (ref 96–112)
Creatinine, Ser: 1.05 mg/dL (ref 0.40–1.50)
GFR: 67.74 mL/min (ref >60.00)
Glucose, Bld: 95 mg/dL (ref 70–99)
Potassium: 4.3 meq/L (ref 3.5–5.1)
Sodium: 138 meq/L (ref 135–145)
Total Bilirubin: 0.7 mg/dL (ref 0.2–1.2)
Total Protein: 7.3 g/dL (ref 6.0–8.3)

## 2023-04-10 ENCOUNTER — Ambulatory Visit (INDEPENDENT_AMBULATORY_CARE_PROVIDER_SITE_OTHER): Payer: 59 | Admitting: Pharmacist

## 2023-04-10 DIAGNOSIS — I255 Ischemic cardiomyopathy: Secondary | ICD-10-CM

## 2023-04-10 DIAGNOSIS — Z79899 Other long term (current) drug therapy: Secondary | ICD-10-CM

## 2023-04-10 DIAGNOSIS — F411 Generalized anxiety disorder: Secondary | ICD-10-CM

## 2023-04-10 DIAGNOSIS — G20A1 Parkinson's disease without dyskinesia, without mention of fluctuations: Secondary | ICD-10-CM

## 2023-04-10 NOTE — Progress Notes (Signed)
Pharmacy Note  04/10/2023 Name: Shawn Meza MRN: [id number] DOB: February 21, 1946  Subjective: Shawn Meza is a 77 y.o. year old male who is a primary care patient of [name], DO. Clinical Pharmacist Practitioner referral was placed to assist with medication management.    Engaged with patient by telephone for follow up visit today.  Medication Management:  Patient has been noted to take medications inconsistently and adjusts therapy himself due to suspected side effects. Adherence has improved a little over the last few months but patient still not 100% compliant Patient is using weekly pill container to help with remembering to take medication.  He also asks for pharmacy to print what medication is for on the label.  He reports he has run out of esomeprazole. He takes as needed. Looks like he need an updated prescription. He has been taking Maloxx instead.  Reviewed refill history and med list with patient.  beclomethasone inhaler Last refill 11/2022 - reports he just restarted taking daily. Does not need fill yet.  Rytary Last refilled  10/18/2022 - per patient not needed.  citalopram - last refill 01/2023 for 30 DS - patient requested refill gabapentin - last refill12/2023 #100 - refill needed.   Levocetirizine 5mg  daily  - patient requested refill.  metoprolol - last refill 12/27/2022 - 100 DS - Per patient refill not needed.  montelukast - last refill 02/06/2023 - 30 DS - patient requests refill  Pravastatin - received refills for 100 day supply 11/02/2022 and 12/28/2022 - not needed at this time; looks like next RF should be due around 05/21/2023  Depression / GAD:  Current therapy: citalopram 20mg  daily.  Patient reports fewer mood swings and anger episodes.  He is seeing counselor - he really likes his current counselor and finds he has helped a lot.    CHF:  ECHO 02/24/2023 - EF improved but still low at 30%. Dr Bing Matter recommended doubling dose of  Entresto to 49/51mg  twice a day.  Current therapy: taking furosemide 40mg  - take 0.5 tablet if needed, metoprolol tartrate 25mg  daily and Entresto - he is still taking 24/26 twice a day but he has plans to pick up new dose 49/51mg  twice a day tomorrow.   Scheduled to have ECHO 02/25/23 Patient denies shortness of breath or increase in weight.  He is checking weight 2 or 3 times per week. Patient report weight has been stable.  Denies edema in lower extremities but he does feel that he retains fluid from time to time in his abdominal / chest area.   Parkinson's Disease:  Current therapy - Rytary 3 times a day (patient usually taking twice a day)   He has bee doing PT / OT recently and he feels that this has been very helpful.  He reports today that he went to hit a few golf balls this weekend which he has not felt he could do prior.   04/05/2023 patient was in office for acute visit with Daneil Dan, PAC for weakness, fatigue. Checked EKG which was stable. Chest xray also did not show any abnormalities. CMP was normal. Looks like BMP was ordered by not completed. She encouraged hydration, adding salt, Gatorade or Pedialyte.     Objective: Review of patient status, including review of consultants reports, laboratory and other test data, was performed as part of comprehensive.  Lab Results  Component Value Date   CREATININE 1.05 04/05/2023   CREATININE 1.13 11/28/2022   CREATININE 1.12 11/11/2022  Lab Results  Component Value Date   HGBA1C 5.8 11/28/2022       Component Value Date/Time   CHOL 166 11/28/2022 1424   CHOL 163 03/31/2022 1708   TRIG 144.0 11/28/2022 1424   TRIG 107 05/09/2010 0000   HDL 36.70 (L) 11/28/2022 1424   HDL 36 (L) 03/31/2022 1708   CHOLHDL 5 11/28/2022 1424   VLDL 28.8 11/28/2022 1424   LDLCALC 100 (H) 11/28/2022 1424   LDLCALC 91 03/31/2022 1708     Clinical ASCVD: Yes  The ASCVD Risk score (Arnett DK, et al., 2019) failed to calculate for  the following reasons:   The patient has a prior MI or stroke diagnosis    BP Readings from Last 3 Encounters:  04/05/23 123/68  01/26/23 128/72  12/28/22 136/80     Allergies  Allergen Reactions   Tizanidine Hcl Hives   Fluoxetine Other (See Comments)    Caused depression and aggression   Rosuvastatin Other (See Comments)    Whole body aches   Testosterone Other (See Comments)    ABDOMINAL PAIN and cramping   Ropinirole Hcl Nausea Only   Requip [Ropinirole] Nausea Only    Medications Reviewed Today   Medications were not reviewed in this encounter     Patient Active Problem List   Diagnosis Date Noted   Major depressive disorder, recurrent episode, moderate (HCC) 06/30/2022   Lumbar spondylosis 06/29/2022   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   Diarrhea 11/04/2020   Rectal bleeding 11/04/2020   Trochanteric bursitis of right hip    Transient ischemic attack    NSTEMI (non-ST elevated myocardial infarction) (HCC)    Nephrolithiasis    Metabolic syndrome    Major depression, chronic    Long-term use of aspirin therapy    Hypercholesterolemia    Hiatal hernia    GERD (gastroesophageal reflux disease)    Essential tremor    Essential hypertension    Erectile dysfunction    Elevated PSA    Colon cancer (HCC)    Chronic coronary artery disease    Arthritis    Myofascial pain 12/20/2019   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   Low back pain 10/28/2019   Radiculopathy, lumbar region 10/28/2019   Fatigue 01/15/2019   Chronic systolic (congestive) heart failure (HCC) 09/18/2018   Ischemic cardiomyopathy 09/16/2018   Aortic regurgitation 09/16/2018   Cardiomyopathy, unspecified (HCC) 06/13/2018   Abscess of right axilla 12/12/2017   BMI 33.0-33.9,adult 12/12/2017   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   Acute blood loss anemia 10/25/2017   Acute postoperative respiratory insufficiency 10/25/2017   Postoperative delirium 10/25/2017    Dyslipidemia 10/17/2017   Chest pain in adult 10/11/2017   SOB (shortness of breath) 03/31/2017   Long term current use of aspirin 03/29/2017   Parkinson's disease 01/02/2017   Memory change 07/06/2015   Depression, recurrent (HCC) 07/06/2015   Medial meniscus tear 10/11/2011   Neuroma of foot 10/11/2011   Coronary artery disease involving native coronary artery of native heart with angina pectoris (HCC) 12/28/2010   OTHER TESTICULAR HYPOFUNCTION 05/20/2010   Mixed hyperlipidemia 05/20/2010   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/20/2010   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 05/20/2010   GERD 05/20/2010   TRANSIENT ISCHEMIC ATTACK, HX OF 05/20/2010   NEPHROLITHIASIS, HX OF 05/20/2010   BENIGN PROSTATIC HYPERTROPHY, HX OF, S/P TURP 05/20/2010     Medication Assistance:  None required.  Patient affirms current coverage meets needs. (Has [employer]  Care Medicare and Medicaid coverage)   Assessment / Plan: CHF / hypertension:  Controlled - will have ECHO later in July 2024.  Continue current medication therapy - metoprolol 25mg  once a day and furosemide 40mg  0.5 tablet as needed.  Encouraged patient to start new dose of Entresto ASAP - 49/51mg  twice a day. Monitor blood pressure with new dose and have labs drawn in 1 week per Dr Bing Matter.   Parkinson's: Continue Rytary ER per Dr Don Perking directions.   Depression:  Continue citalopram to 20mg  daily  Medication management:  Reviewed and updated medication list Reviewed refill history and adherence.  Encouraged patient to use weekly pill container to help with medication adherance.  Requested needed Rxs with patient permission - gabapentin, citalopram,  Levocetirizine and montelukast.    Follow Up:  2 to 3 months   [name], PharmD Clinical Pharmacist [facility]  - [facility] [phone]

## 2023-04-12 ENCOUNTER — Ambulatory Visit (INDEPENDENT_AMBULATORY_CARE_PROVIDER_SITE_OTHER): Payer: 59 | Admitting: Psychology

## 2023-04-12 DIAGNOSIS — F331 Major depressive disorder, recurrent, moderate: Secondary | ICD-10-CM | POA: Diagnosis not present
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
## 2023-04-17 ENCOUNTER — Ambulatory Visit: Payer: 59 | Admitting: Occupational Therapy

## 2023-04-17 ENCOUNTER — Ambulatory Visit: Payer: 59 | Attending: Family Medicine | Admitting: Physical Therapy

## 2023-04-18 ENCOUNTER — Telehealth: Payer: Self-pay | Admitting: Physical Therapy

## 2023-04-18 NOTE — Telephone Encounter (Signed)
Phoned patient; no answer, left message regarding pt's missed PT treatment and OT eval yesterday.  These were pt's last scheduled appointments; requested that patient call back to reschedule.  If we haven't heard from him by 04/21/23, we will plan to discharge his chart.  Lonia Blood, PT 04/18/23 8:57 AM Phone: [phone] Fax: [phone]  [facility] at [facility] [address] [place], [address] [place], [place] [address] Phone # [phone] Fax # [phone]

## 2023-04-19 NOTE — Progress Notes (Unsigned)
Assessment/Plan:   1.  Parkinsons Disease  -Clinically, he is always worse when he is off of levodopa.  He has tried nearly every form of levodopa on the market. However, he often doesn't take med or doesn't take it as directed.  This continues with his current medication, which we have increased, but ultimately the real issue is that he does not take it as directed.  He has not filled his Rytary since October 18, 2022 (6 months ago).  2.  Depression  -Supposed to be on bupropion, 75 mg twice per day, but is only taking half a tablet daily  -Continues to follow with Dr. Dellia Cloud.  He was last seen on September 11.  3.  B12 deficiency  -On supplementation.  4.  Lumbar spinal stenosis  -has followed with Dr. Venetia Maxon and [place]  -Has seen Dr. Jordan Likes   5.  Insomnia  -Continue melatonin to 3 mg.  6.  SOB  `-Patient previously attributed this to Rytary, but cardiology visit note clearly that patient has severe cardiomyopathy and not taking his diuretic, which has resulted in more edema.  He has restarted rytary and is tolerating it well Subjective:   Shawn Meza was seen today in follow up for Parkinsons disease.  My previous records were reviewed prior to todays visit as well as outside records available to me.  Last visit, I increased the patient's Rytary to 245 mg, but ultimately told him that I likely would not have to increase it if he would take it as directed.  Unfortunately, he continues to not take it as directed and pharmacist notes from September 9 indicate that he has not filled the medication since March 19 (90-day supply)  Current prescribed movement disorder medications: Rytary, 245 mg, 1 po tid  B12 supplement Melatonin, 3 mg  Prior medications: Carbidopa/levodopa 25/100 IR: Carbidopa/levodopa 25/100 CR: Carbidopa/levodopa 50/200 CR; rytary (c/o dry mouth, trouble breathing at night and constipation); Trintellix (he thought it caused dreams, even though we told him that  it was the Parkinsons that was causing the dreaming)   ALLERGIES:   Allergies  Allergen Reactions   Tizanidine Hcl Hives   Fluoxetine Other (See Comments)    Caused depression and aggression   Rosuvastatin Other (See Comments)    Whole body aches   Testosterone Other (See Comments)    ABDOMINAL PAIN and cramping   Ropinirole Hcl Nausea Only   Requip [Ropinirole] Nausea Only    CURRENT MEDICATIONS:  Outpatient Encounter Medications as of 04/20/2023  Medication Sig   acetaminophen (TYLENOL) 325 MG tablet Take 162.5 mg by mouth every 6 (six) hours as needed for mild pain or moderate pain.   aspirin 81 MG EC tablet Take 1 tablet (81 mg total) by mouth daily.   beclomethasone (QVAR REDIHALER) 40 MCG/ACT inhaler Inhale 2 puffs into the lungs 2 (two) times daily.   Carbidopa-Levodopa ER (RYTARY) 61.25-245 MG CPCR Take 1 capsule by mouth 3 (three) times daily.   citalopram (CELEXA) 20 MG tablet Take 1 tablet (20 mg total) by mouth daily.   esomeprazole (NEXIUM) 40 MG capsule Take 1 capsule (40 mg total) by mouth at bedtime. (Patient not taking: Reported on 04/10/2023)   fluticasone (FLONASE) 50 MCG/ACT nasal spray Place 2 sprays into both nostrils daily.   furosemide (LASIX) 40 MG tablet Take 20 mg by mouth as needed for fluid or edema (shortness of breath).   gabapentin (NEURONTIN) 100 MG capsule Take 1 capsule (100 mg total) by  mouth at bedtime.   levocetirizine (XYZAL) 5 MG tablet Take 1 tablet (5 mg total) by mouth every evening. For allergies   metoprolol tartrate (LOPRESSOR) 25 MG tablet Take 1 tablet (25 mg total) by mouth daily.   montelukast (SINGULAIR) 10 MG tablet Take 1 tablet (10 mg total) by mouth at bedtime.   nitroGLYCERIN (NITROSTAT) 0.4 MG SL tablet Place 1 tablet (0.4 mg total) under the tongue every 5 (five) minutes x 3 doses as needed for chest pain. If chest pain is not relieved after 2nd dose - call 911.   pravastatin (PRAVACHOL) 20 MG tablet Take 1 tablet (20 mg total)  by mouth daily.   sacubitril-valsartan (ENTRESTO) 49-51 MG Take 1 tablet by mouth 2 (two) times daily.   No facility-administered encounter medications on file as of 04/20/2023.    Objective:   PHYSICAL EXAMINATION:    VITALS:   There were no vitals filed for this visit.  GEN:  The patient appears stated age and is in NAD. HEENT:  Normocephalic, atraumatic.  The mucous membranes are moist.   Neurological examination:  Orientation: The patient is alert and oriented x3. Cranial nerves: There is good facial symmetry with facial hypomimia. The speech is fluent and clear. Soft palate rises symmetrically and there is no tongue deviation. Hearing is intact to conversational tone. Sensation: Sensation is intact to light touch throughout Motor: Strength is at least antigravity x4.  Pt seen at 3:15 but hasn't taken medication today:  Movement examination: Tone: There is mild increased tone in the LUE Abnormal movements: there is LUE/LLE rest tremor Coordination:  There is decremation, with any form of RAMS, including alternating supination and pronation of the forearm, hand opening and closing, finger taps, heel taps and toe taps on the L Gait and Station: The patient is a bit slow to arise.  The patient's stride length is actually quite good today, especially considering it has been a long time since he has taken medication.  He just slightly drags the left leg.  This is similar to last visit.  I have reviewed and interpreted the following labs independently    Chemistry      Component Value Date/Time   NA 138 04/05/2023 1559   NA 142 11/11/2022 1508   K 4.3 04/05/2023 1559   CL 105 04/05/2023 1559   CO2 22 04/05/2023 1559   BUN 19 04/05/2023 1559   BUN 16 11/11/2022 1508   CREATININE 1.05 04/05/2023 1559   CREATININE 1.03 05/20/2015 1410      Component Value Date/Time   CALCIUM 9.3 04/05/2023 1559   ALKPHOS 65 04/05/2023 1559   AST 20 04/05/2023 1559   ALT 20 04/05/2023 1559    BILITOT 0.7 04/05/2023 1559   BILITOT 0.3 03/31/2022 1708       Lab Results  Component Value Date   WBC 9.3 11/28/2022   HGB 16.3 11/28/2022   HCT 47.9 11/28/2022   MCV 95.1 11/28/2022   PLT 206.0 11/28/2022    Lab Results  Component Value Date   TSH 1.910 03/31/2022   Total time spent on today's visit was *** minutes, including both face-to-face time and nonface-to-face time.  Time included that spent on review of records (prior notes available to me/labs/imaging if pertinent), discussing treatment and goals, answering patient's questions and coordinating care.   Cc:  [name], DO

## 2023-04-20 ENCOUNTER — Encounter: Payer: Self-pay | Admitting: Neurology

## 2023-04-20 ENCOUNTER — Ambulatory Visit (INDEPENDENT_AMBULATORY_CARE_PROVIDER_SITE_OTHER): Payer: 59 | Admitting: Neurology

## 2023-04-20 VITALS — BP 124/86 | HR 83 | Ht 66.0 in | Wt 204.0 lb

## 2023-04-20 DIAGNOSIS — Z91199 Patient's noncompliance with other medical treatment and regimen due to unspecified reason: Secondary | ICD-10-CM | POA: Diagnosis not present

## 2023-04-20 DIAGNOSIS — G20A1 Parkinson's disease without dyskinesia, without mention of fluctuations: Secondary | ICD-10-CM | POA: Diagnosis not present

## 2023-04-20 NOTE — Patient Instructions (Signed)
Fill your rytary and take it THREE times per day  SAVE THE DATE!  We are planning a Parkinsons Disease educational symposium at [facility] in [place] on October 11.  More details to come!  If you would like to be added to our email list to get further information, email [email or web address].  To sign up, you can email [email or web address].  We hope to see you there!

## 2023-04-21 ENCOUNTER — Encounter: Payer: Self-pay | Admitting: Physical Therapy

## 2023-04-21 NOTE — Therapy (Signed)
[place] [facility] [address], [address] [place], [place], [address] Phone: [phone]   Fax:  [phone]  Patient Details  Name: Shawn Meza MRN: [id number] Date of Birth: June 10, 1946 Referring Provider:  No ref. provider found  Encounter Date: 04/21/2023  PHYSICAL THERAPY DISCHARGE SUMMARY  Visits from Start of Care: 5 (last visit 03/15/2023)  Current functional level related to goals / functional outcomes: Pt has met 1 of 3 STGs; LTGs not fully addressed, as pt has not returned since 03/15/23 visit. SHORT TERM GOALS: Target date: 03/17/2023   Pt will be independent with Parkinson's specific HEP for improved balance, gait, transfers. Baseline: Goal status: GOAL MET, 03/15/2023   2. Pt will improve TUG score to less than or equal to 13.5 sec for decreased fall risk. Baseline: 17.6 sec>15.09 sec Goal status: GOAL PARTIALLY MET 03/15/2023   3.  Pt will improve 5x sit<>stand to less than or equal to 12 sec to demonstrate improved functional strength and transfer efficiency. Baseline: 13.03 sec Goal status: NOT MET, 03/15/2023   Remaining deficits: Balance, gait   Education / Equipment: HEP initiated.   Patient agrees to discharge. Patient goals were not met. Patient is being discharged due to not returning since the last visit.   Derrall Hicks W., PT 04/21/2023, 8:52 AM  [place] [address] [place], [place], [address] Phone: [phone]   Fax:  [phone]

## 2023-04-26 ENCOUNTER — Ambulatory Visit (INDEPENDENT_AMBULATORY_CARE_PROVIDER_SITE_OTHER): Payer: 59 | Admitting: Psychology

## 2023-04-26 DIAGNOSIS — F331 Major depressive disorder, recurrent, moderate: Secondary | ICD-10-CM | POA: Diagnosis not present
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
## 2023-05-10 ENCOUNTER — Ambulatory Visit (INDEPENDENT_AMBULATORY_CARE_PROVIDER_SITE_OTHER): Payer: 59 | Admitting: Psychology

## 2023-05-10 DIAGNOSIS — F331 Major depressive disorder, recurrent, moderate: Secondary | ICD-10-CM

## 2023-05-10 DIAGNOSIS — F419 Anxiety disorder, unspecified: Secondary | ICD-10-CM | POA: Diagnosis not present
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
## 2023-05-17 ENCOUNTER — Telehealth: Payer: Self-pay | Admitting: Family Medicine

## 2023-05-17 NOTE — Telephone Encounter (Signed)
Copied from CRM [id number]. Topic: Medicare AWV >> May 17, 2023 10:27 AM Payton Doughty wrote: Reason for CRM: Called LVM 05/17/2023 to schedule Annual Wellness Visit  Verlee Rossetti; Care Guide Ambulatory Clinical Support [place] [facility] Direct Dial: [phone]

## 2023-05-24 ENCOUNTER — Ambulatory Visit (INDEPENDENT_AMBULATORY_CARE_PROVIDER_SITE_OTHER): Payer: 59 | Admitting: Psychology

## 2023-05-24 DIAGNOSIS — F339 Major depressive disorder, recurrent, unspecified: Secondary | ICD-10-CM | POA: Diagnosis not present

## 2023-05-24 DIAGNOSIS — F419 Anxiety disorder, unspecified: Secondary | ICD-10-CM | POA: Diagnosis not present

## 2023-05-24 DIAGNOSIS — F331 Major depressive disorder, recurrent, moderate: Secondary | ICD-10-CM
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
## 2023-06-07 ENCOUNTER — Ambulatory Visit: Payer: 59 | Admitting: Psychology

## 2023-06-07 DIAGNOSIS — F329 Major depressive disorder, single episode, unspecified: Secondary | ICD-10-CM

## 2023-06-07 DIAGNOSIS — F419 Anxiety disorder, unspecified: Secondary | ICD-10-CM

## 2023-06-07 DIAGNOSIS — F331 Major depressive disorder, recurrent, moderate: Secondary | ICD-10-CM
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
## 2023-06-21 ENCOUNTER — Ambulatory Visit: Payer: 59 | Admitting: Psychology

## 2023-07-05 ENCOUNTER — Ambulatory Visit: Payer: 59 | Admitting: Psychology

## 2023-07-05 DIAGNOSIS — F411 Generalized anxiety disorder: Secondary | ICD-10-CM

## 2023-07-05 DIAGNOSIS — F329 Major depressive disorder, single episode, unspecified: Secondary | ICD-10-CM

## 2023-07-05 DIAGNOSIS — F331 Major depressive disorder, recurrent, moderate: Secondary | ICD-10-CM
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
## 2023-07-10 ENCOUNTER — Other Ambulatory Visit: Payer: Self-pay | Admitting: Family Medicine

## 2023-07-10 ENCOUNTER — Ambulatory Visit (INDEPENDENT_AMBULATORY_CARE_PROVIDER_SITE_OTHER): Payer: 59 | Admitting: Pharmacist

## 2023-07-10 DIAGNOSIS — J3489 Other specified disorders of nose and nasal sinuses: Secondary | ICD-10-CM

## 2023-07-10 DIAGNOSIS — J309 Allergic rhinitis, unspecified: Secondary | ICD-10-CM

## 2023-07-10 DIAGNOSIS — I2510 Atherosclerotic heart disease of native coronary artery without angina pectoris: Secondary | ICD-10-CM

## 2023-07-10 MED ORDER — NITROGLYCERIN 0.4 MG SL SUBL: 0.4000 mg | SUBLINGUAL_TABLET | SUBLINGUAL | 0 refills | Status: DC | PRN

## 2023-07-10 MED ORDER — MONTELUKAST SODIUM 10 MG PO TABS: 10.0000 mg | ORAL_TABLET | Freq: Every day | ORAL | 0 refills | Status: DC

## 2023-07-10 MED ORDER — FLUTICASONE PROPIONATE 50 MCG/ACT NA SUSP: 2.0000 | Freq: Every day | NASAL | 2 refills | Status: DC

## 2023-07-10 MED ORDER — LEVOCETIRIZINE DIHYDROCHLORIDE 5 MG PO TABS: 5.0000 mg | ORAL_TABLET | Freq: Every evening | ORAL | 0 refills | Status: DC

## 2023-07-10 MED ORDER — FUROSEMIDE 20 MG PO TABS: 20.0000 mg | ORAL_TABLET | ORAL | 0 refills | Status: DC | PRN

## 2023-07-10 NOTE — Progress Notes (Signed)
Pharmacy Note  07/10/2023 Name: Shawn Meza MRN: [id number] DOB: 06/29/1946  Subjective: Shawn Meza is a 77 y.o. year old male who is a primary care patient of [name], DO. Clinical Pharmacist Practitioner referral was placed to assist with medication management.    Engaged with patient by telephone for follow up visit today.  Upcoming appointments:  Cardio - Dr Bing Matter 09/20/2023  Neuro - Dr [name] 10/20/2023  Patient reports he missed his AWV and would like to reschedule as well as get a flu vaccine.   Medication Management:  Patient has been noted in the past to take medications inconsistently. Over the last 6 months adherence has improved for most medications.   Patient is using weekly pill container to help with remembering to take medication.  He also asks for pharmacy to print what medication is for on the label.   Reviewed refill history and med list with patient.  beclomethasone inhaler Last refill 11/2022 - reports he just restarted taking daily. States he has had more congestion recently. Recommended he take 2 inhalations twice a day for the next few weeks to see if improves.  Rytary Last refilled 04/21/2023 for 90 days supply - per patient not needed yet citalopram - last refill 04/10/2023 for 30 DS - patient requested refill gabapentin - last refill 04/10/2023  #100 - refill not needed per patient  Levocetirizine 5mg  daily  - patient requested refill.  metoprolol - last refill 12/27/2022 - 100 DS (also had 100 DS filled 11/02/2022 - Per patient refill not needed.  montelukast - last refill 04/10/2023 - 30 DS - patient requests refill  Pravastatin - received refills for 100 day supply 05/02/2023 Entresto - last refill was for 30 DS 04/10/2023 - patient requests refill.  He also requests the following as needed medications - Nitroglycerin, fluticasone nasal spray and furosemide.    Depression / GAD:  Current therapy: citalopram 20mg  daily.   Patient reports fewer mood swings and anger episodes. He is taking at night because he feels the citalopram makes him sleepy.  He is seeing counselor regularly.    CHF:  ECHO 02/24/2023 - EF improved but still low at 30%. Dr Bing Matter recommended doubling dose of Entresto to 49/51mg  twice a day.  Current therapy: taking furosemide 40mg  - take 0.5 tablet if needed, metoprolol tartrate 25mg  daily and Entresto - 49/51mg   twice a day.   Patient denies shortness of breath or increase in weight.  He is checking weight 2 or 3 times per week. Patient report weight has been stable.  Reports occsional edema in lower extremities that is relieved with as needed furosemide  Parkinson's Disease:  Current therapy - Rytary 3 times a day (taking at 11am, 4pm and 7pm)     Objective: Review of patient status, including review of consultants reports, laboratory and other test data, was performed as part of comprehensive.  Lab Results  Component Value Date   CREATININE 1.05 04/05/2023   CREATININE 1.13 11/28/2022   CREATININE 1.12 11/11/2022    Lab Results  Component Value Date   HGBA1C 5.8 11/28/2022       Component Value Date/Time   CHOL 166 11/28/2022 1424   CHOL 163 03/31/2022 1708   TRIG 144.0 11/28/2022 1424   TRIG 107 05/09/2010 0000   HDL 36.70 (L) 11/28/2022 1424   HDL 36 (L) 03/31/2022 1708   CHOLHDL 5 11/28/2022 1424   VLDL 28.8 11/28/2022 1424   LDLCALC 100 (H) 11/28/2022 1424  LDLCALC 91 03/31/2022 1708     Clinical ASCVD: Yes  The ASCVD Risk score (Arnett DK, et al., 2019) failed to calculate for the following reasons:   The patient has a prior MI or stroke diagnosis    BP Readings from Last 3 Encounters:  04/20/23 124/86  04/05/23 123/68  01/26/23 128/72     Allergies  Allergen Reactions   Tizanidine Hcl Hives   Fluoxetine Other (See Comments)    Caused depression and aggression   Rosuvastatin Other (See Comments)    Whole body aches   Testosterone Other  (See Comments)    ABDOMINAL PAIN and cramping   Ropinirole Hcl Nausea Only   Requip [Ropinirole] Nausea Only    Medications Reviewed Today     Reviewed by [name], RPH-CPP (Pharmacist) on 07/10/23 at 1427  Med List Status: <None>   Medication Order Taking? Sig Documenting Provider Last Dose Status Informant  acetaminophen (TYLENOL) 325 MG tablet [id number]  Take 162.5 mg by mouth every 6 (six) hours as needed for mild pain or moderate pain. [provider]  Active   aspirin 81 MG EC tablet [id number]  Take 1 tablet (81 mg total) by mouth daily. [name], DO  Active   beclomethasone (QVAR REDIHALER) 40 MCG/ACT inhaler [id number] Yes Inhale 2 puffs into the lungs 2 (two) times daily. [name], DO Taking Active   Carbidopa-Levodopa ER Blenda Peals) 61.25-245 MG CPCR [id number] Yes Take 1 capsule by mouth 3 (three) times daily. [name], DO Taking Active   citalopram (CELEXA) 20 MG tablet [id number] Yes Take 1 tablet (20 mg total) by mouth daily. [name], DO Taking Active            Med Note [name] B   Mon Jul 10, 2023  2:18 PM) Takes at night  fluticasone [facility]) 50 MCG/ACT nasal spray [id number] Yes Place 2 sprays into both nostrils daily. [name], DO Taking Active   furosemide (LASIX) 40 MG tablet [id number] Yes Take 20 mg by mouth as needed for fluid or edema (shortness of breath). [provider] Taking Active            Med Note Tiburcio Pea, ABBIE R   Thu Oct 20, 2022  2:21 PM)    gabapentin (NEURONTIN) 100 MG capsule [id number] Yes Take 1 capsule (100 mg total) by mouth at bedtime. [name], DO Taking Active   levocetirizine (XYZAL) 5 MG tablet [id number] Yes Take 1 tablet (5 mg total) by mouth every evening. For allergies [name], DO Taking Active   metoprolol tartrate (LOPRESSOR) 25 MG tablet [id number] Yes Take 1 tablet (25 mg total) by mouth daily. Baldo Daub, MD Taking  Active   montelukast (SINGULAIR) 10 MG tablet [id number] Yes Take 1 tablet (10 mg total) by mouth at bedtime. [name], DO Taking Active   nitroGLYCERIN (NITROSTAT) 0.4 MG SL tablet [id number]  Place 1 tablet (0.4 mg total) under the tongue every 5 (five) minutes x 3 doses as needed for chest pain. If chest pain is not relieved after 2nd dose - call 911. [name], DO  Active   pravastatin (PRAVACHOL) 20 MG tablet [id number] Yes Take 1 tablet (20 mg total) by mouth daily. [name], DO Taking Active   sacubitril-valsartan (ENTRESTO) 49-51 MG [id number] Yes Take 1 tablet by mouth 2 (two) times daily. Georgeanna Lea, MD Taking Active  Patient Active Problem List   Diagnosis Date Noted   Major depressive disorder, recurrent episode, moderate (HCC) 06/30/2022   Lumbar spondylosis 06/29/2022   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   Diarrhea 11/04/2020   Rectal bleeding 11/04/2020   Trochanteric bursitis of right hip    Transient ischemic attack    NSTEMI (non-ST elevated myocardial infarction) (HCC)    Nephrolithiasis    Metabolic syndrome    Major depression, chronic    Long-term use of aspirin therapy    Hypercholesterolemia    Hiatal hernia    GERD (gastroesophageal reflux disease)    Essential tremor    Essential hypertension    Erectile dysfunction    Elevated PSA    Colon cancer (HCC)    Chronic coronary artery disease    Arthritis    Myofascial pain 12/20/2019   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   Low back pain 10/28/2019   Radiculopathy, lumbar region 10/28/2019   Fatigue 01/15/2019   Chronic systolic (congestive) heart failure (HCC) 09/18/2018   Ischemic cardiomyopathy 09/16/2018   Aortic regurgitation 09/16/2018   Cardiomyopathy, unspecified (HCC) 06/13/2018   Abscess of right axilla 12/12/2017   BMI 33.0-33.9,adult 12/12/2017   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   Acute blood loss  anemia 10/25/2017   Acute postoperative respiratory insufficiency 10/25/2017   Postoperative delirium 10/25/2017   Dyslipidemia 10/17/2017   Chest pain in adult 10/11/2017   SOB (shortness of breath) 03/31/2017   Long term current use of aspirin 03/29/2017   Parkinson's disease (HCC) 01/02/2017   Memory change 07/06/2015   Depression, recurrent (HCC) 07/06/2015   Medial meniscus tear 10/11/2011   Neuroma of foot 10/11/2011   Coronary artery disease involving native coronary artery of native heart with angina pectoris (HCC) 12/28/2010   OTHER TESTICULAR HYPOFUNCTION 05/20/2010   Mixed hyperlipidemia 05/20/2010   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/20/2010   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 05/20/2010   GERD 05/20/2010   History of cardiovascular disorder 05/20/2010   NEPHROLITHIASIS, HX OF 05/20/2010   BENIGN PROSTATIC HYPERTROPHY, HX OF, S/P TURP 05/20/2010     Medication Assistance:  None required.  Patient affirms current coverage meets needs. (Has [facility] and Medicaid coverage)   Assessment / Plan: CHF / hypertension:  Controlled  Continue current medication therapy - Entresto, metoprolol 25mg  once a day; changed furosemide from 40mg  to 20mg  so pt does not need to cut in half - still 20mg  daily as needed.   Parkinson's: Continue Rytary ER per Dr Don Perking directions.   Depression:  Continue citalopram to 20mg  daily  Medication management:  Reviewed and updated medication list Reviewed refill history and adherence.  Encouraged patient to use weekly pill container to help with medication adherance.  Requested needed meds from [facility] at patient requeset; updated Rx's below. (Citalopram, Qvar and Entresto had refills)  Meds ordered this encounter  Medications   fluticasone (FLONASE) 50 MCG/ACT nasal spray    Sig: Place 2 sprays into both nostrils daily.    Dispense:  16 g    Refill:  2   levocetirizine (XYZAL) 5 MG  tablet    Sig: Take 1 tablet (5 mg total) by mouth every evening. For allergies    Dispense:  90 tablet    Refill:  0   nitroGLYCERIN (NITROSTAT) 0.4 MG SL tablet    Sig: Place 1 tablet (0.4 mg total) under the tongue every 5 (five) minutes x 3 doses as  needed for chest pain. If chest pain is not relieved after 2nd dose - call 911.    Dispense:  25 tablet    Refill:  0   montelukast (SINGULAIR) 10 MG tablet    Sig: Take 1 tablet (10 mg total) by mouth at bedtime.    Dispense:  90 tablet    Refill:  0   furosemide (LASIX) 20 MG tablet    Sig: Take 1 tablet (20 mg total) by mouth as needed for fluid or edema (shortness of breath).    Dispense:  30 tablet    Refill:  0   Health Maintenance:  Reviewed vaccination history and discussed benefits of flu vaccine. Patient also needed AWV rescheduled. Scheduled for 07/14/2023 - pt will also get flu vaccine while in office.   Follow Up:  2 to 3 months   [name], PharmD Clinical Pharmacist [facility]  - [facility] [phone]

## 2023-07-12 NOTE — Progress Notes (Signed)
Pt did not answer phone for AWV.   This encounter was created in error - please disregard.

## 2023-07-19 ENCOUNTER — Ambulatory Visit: Payer: 59 | Admitting: Psychology

## 2023-07-19 DIAGNOSIS — F331 Major depressive disorder, recurrent, moderate: Secondary | ICD-10-CM

## 2023-07-19 DIAGNOSIS — F329 Major depressive disorder, single episode, unspecified: Secondary | ICD-10-CM | POA: Diagnosis not present

## 2023-07-19 DIAGNOSIS — F419 Anxiety disorder, unspecified: Secondary | ICD-10-CM | POA: Diagnosis not present
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
## 2023-08-14 DIAGNOSIS — H25813 Combined forms of age-related cataract, bilateral: Secondary | ICD-10-CM | POA: Diagnosis not present

## 2023-08-16 ENCOUNTER — Ambulatory Visit: Payer: 59 | Admitting: Psychology

## 2023-08-16 DIAGNOSIS — F419 Anxiety disorder, unspecified: Secondary | ICD-10-CM

## 2023-08-16 DIAGNOSIS — F331 Major depressive disorder, recurrent, moderate: Secondary | ICD-10-CM
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
## 2023-08-30 ENCOUNTER — Ambulatory Visit: Payer: 59 | Admitting: Psychology

## 2023-08-30 DIAGNOSIS — F331 Major depressive disorder, recurrent, moderate: Secondary | ICD-10-CM

## 2023-08-30 DIAGNOSIS — F329 Major depressive disorder, single episode, unspecified: Secondary | ICD-10-CM | POA: Diagnosis not present
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
## 2023-09-13 ENCOUNTER — Ambulatory Visit: Payer: 59 | Admitting: Psychology

## 2023-09-13 DIAGNOSIS — F419 Anxiety disorder, unspecified: Secondary | ICD-10-CM | POA: Diagnosis not present

## 2023-09-13 DIAGNOSIS — F331 Major depressive disorder, recurrent, moderate: Secondary | ICD-10-CM

## 2023-09-13 DIAGNOSIS — F329 Major depressive disorder, single episode, unspecified: Secondary | ICD-10-CM | POA: Diagnosis not present
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
## 2023-09-18 ENCOUNTER — Ambulatory Visit (INDEPENDENT_AMBULATORY_CARE_PROVIDER_SITE_OTHER): Payer: 59

## 2023-09-18 DIAGNOSIS — F411 Generalized anxiety disorder: Secondary | ICD-10-CM

## 2023-09-18 DIAGNOSIS — I2583 Coronary atherosclerosis due to lipid rich plaque: Secondary | ICD-10-CM

## 2023-09-18 DIAGNOSIS — Z79899 Other long term (current) drug therapy: Secondary | ICD-10-CM

## 2023-09-18 DIAGNOSIS — I2510 Atherosclerotic heart disease of native coronary artery without angina pectoris: Secondary | ICD-10-CM

## 2023-09-18 MED ORDER — ESOMEPRAZOLE MAGNESIUM 40 MG PO CPDR: 40.0000 mg | DELAYED_RELEASE_CAPSULE | Freq: Every day | ORAL | 0 refills | Status: DC

## 2023-09-18 NOTE — Progress Notes (Unsigned)
Pharmacy Note  09/20/2023 Name: Shawn Meza MRN: [id number] DOB: [birth year]  Subjective: Shawn Meza is a 78 y.o. year old male who is a primary care patient of [name], DO. Clinical Pharmacist Practitioner referral was placed to assist with medication management.    Engaged with patient by telephone for follow up visit today.  Upcoming appointments:  Cardio - Dr Bing Matter 09/20/2023  Neuro - Dr [name] 10/20/2023  PCP / [name] and [facility] - 11/29/2023  Medication Management:  Patient has been noted in the past to take medications inconsistently. He reports he is taking medications as prescribed.   Patient is using weekly pill container to help with remembering to take medication.  He also asks for pharmacy to print what medication is for on the label.   Reviewed refill history and med list with patient.  beclomethasone inhaler Last refill 07/11/2023 - 30 days  - refill needed Fluticasone nasel spray - 07/10/2023 - 30 days - refill needed.  Rytary Last refilled 04/21/2023 for 90 days supply - states he doesn't need at this time citalopram - last refill 07/10/2023 for 30 DS  - refill needed gabapentin - last refill 04/10/2023  #100 - refill not needed per patient  Levocetirizine 5mg  daily  -last refilled 07/10/2023 for 90 days metoprolol - last refill 12/27/2022 - 100 DS (also had 100 DS filled 11/02/2022 - Per patient refill needed.  montelukast - last refill 07/09/2024 - 90 day supply - refill not needed Pravastatin - received refills for 100 day supply 05/02/2023 -  per patient refill needed Entresto - last refill was for 30 DS 07/10/2023  - per patient refill needed  Patient reports he needs a refill for Nexium - this was taken off his medication list in past with notation that he stopped taking on his own.   He states that he often has dry mouth - especially at night.  He has not tried anything for dry mouth.   Depression / GAD:  Current therapy: citalopram  20mg  daily.  Patient reports fewer mood swings and anger episodes. He is taking at night because he feels the citalopram makes him sleepy.  He is seeing counselor regularly.   CHF:  ECHO 02/24/2023 - EF improved but still low at 30%. Dr Bing Matter recommended doubling dose of Entresto to 49/51mg  twice a day.  Current therapy: taking furosemide 20mg  daily if needed; metoprolol tartrate 25mg  daily and Entresto - 49/51mg   twice a day. At last phone visit we changed from 0.5 tablet of 40mg  dose at last visit so patient wouldn't need to half tablet. However per patient recently he doesn't feel that when he takes 1 tablet of furosemide 20mg  is not making him go to the bathroom as much. He denies edema in lower extremities. He feels like his left breast is retaining fluid.  He reports that he sometimes has shortness of breath with activity.   Patient denies shortness of breath or increase in weight.  He is checking weight 2 or 3 times per week. Patient report weight has been stable.  Reports occsional edema in lower extremities that is relieved with as needed furosemide  Parkinson's Disease:  Current therapy - Rytary 3 times a day (taking at 11am, 4pm and 7pm)     Objective: Review of patient status, including review of consultants reports, laboratory and other test data, was performed as part of comprehensive.  Lab Results  Component Value Date   CREATININE 1.05 04/05/2023   CREATININE  1.13 11/28/2022   CREATININE 1.12 11/11/2022    Lab Results  Component Value Date   HGBA1C 5.8 11/28/2022       Component Value Date/Time   CHOL 166 11/28/2022 1424   CHOL 163 03/31/2022 1708   TRIG 144.0 11/28/2022 1424   TRIG 107 05/09/2010 0000   HDL 36.70 (L) 11/28/2022 1424   HDL 36 (L) 03/31/2022 1708   CHOLHDL 5 11/28/2022 1424   VLDL 28.8 11/28/2022 1424   LDLCALC 100 (H) 11/28/2022 1424   LDLCALC 91 03/31/2022 1708     Clinical ASCVD: Yes  The ASCVD Risk score (Arnett DK, et al.,  2019) failed to calculate for the following reasons:   Risk score cannot be calculated because patient has a medical history suggesting prior/existing ASCVD    BP Readings from Last 3 Encounters:  04/20/23 124/86  04/05/23 123/68  01/26/23 128/72     Allergies  Allergen Reactions   Tizanidine Hcl Hives   Fluoxetine Other (See Comments)    Caused depression and aggression   Rosuvastatin Other (See Comments)    Whole body aches   Testosterone Other (See Comments)    ABDOMINAL PAIN and cramping   Ropinirole Hcl Nausea Only   Requip [Ropinirole] Nausea Only    Medications Reviewed Today     Reviewed by [name], RPH-CPP (Pharmacist) on 09/20/23 at 0959  Med List Status: <None>   Medication Order Taking? Sig Documenting Provider Last Dose Status Informant  acetaminophen (TYLENOL) 325 MG tablet [id number]  Take 162.5 mg by mouth every 6 (six) hours as needed for mild pain or moderate pain. [provider]  Active   aspirin 81 MG EC tablet [id number] No Take 1 tablet (81 mg total) by mouth daily.  Patient not taking: Reported on 09/18/2023   [name], DO Not Taking Active   beclomethasone (QVAR REDIHALER) 40 MCG/ACT inhaler [id number] Yes Inhale 2 puffs into the lungs 2 (two) times daily. [name], DO Taking Active   Carbidopa-Levodopa ER Blenda Peals) 61.25-245 MG CPCR [id number] Yes Take 1 capsule by mouth 3 (three) times daily. [name], DO Taking Active   citalopram (CELEXA) 20 MG tablet [id number] Yes Take 1 tablet (20 mg total) by mouth daily. [name], DO Taking Active            Med Note [name] B   Mon Jul 10, 2023  2:18 PM) Takes at night  esomeprazole (NEXIUM) 40 MG capsule [id number] Yes Take 1 capsule (40 mg total) by mouth daily. [name], DO  Active   fluticasone [facility]) 50 MCG/ACT nasal spray [id number] Yes Place 2 sprays into both nostrils daily. [name], DO Taking Active    furosemide (LASIX) 20 MG tablet [id number] Yes Take 1 tablet (20 mg total) by mouth as needed for fluid or edema (shortness of breath). [name], DO Taking Active   gabapentin (NEURONTIN) 100 MG capsule [id number] Yes Take 1 capsule (100 mg total) by mouth at bedtime. [name], DO Taking Active   levocetirizine (XYZAL) 5 MG tablet [id number] Yes Take 1 tablet (5 mg total) by mouth every evening. For allergies [name], DO Taking Active   metoprolol tartrate (LOPRESSOR) 25 MG tablet [id number] Yes Take 1 tablet (25 mg total) by mouth daily. Baldo Daub, MD Taking Active   montelukast (SINGULAIR) 10 MG tablet [id number] Yes Take 1 tablet (10 mg total) by mouth at bedtime. [name], DO Taking Active  nitroGLYCERIN (NITROSTAT) 0.4 MG SL tablet [id number]  Place 1 tablet (0.4 mg total) under the tongue every 5 (five) minutes x 3 doses as needed for chest pain. If chest pain is not relieved after 2nd dose - call 911. [name], DO  Active   pravastatin (PRAVACHOL) 20 MG tablet [id number] Yes Take 1 tablet (20 mg total) by mouth daily. [name], DO Taking Active   sacubitril-valsartan (ENTRESTO) 49-51 MG [id number] Yes Take 1 tablet by mouth 2 (two) times daily. Georgeanna Lea, MD Taking Active             Patient Active Problem List   Diagnosis Date Noted   Major depressive disorder, recurrent episode, moderate (HCC) 06/30/2022   Lumbar spondylosis 06/29/2022   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   Diarrhea 11/04/2020   Rectal bleeding 11/04/2020   Trochanteric bursitis of right hip    Transient ischemic attack    NSTEMI (non-ST elevated myocardial infarction) (HCC)    Nephrolithiasis    Metabolic syndrome    Major depression, chronic    Long-term use of aspirin therapy    Hypercholesterolemia    Hiatal hernia    GERD (gastroesophageal reflux disease)    Essential tremor    Essential hypertension     Erectile dysfunction    Elevated PSA    Colon cancer (HCC)    Chronic coronary artery disease    Arthritis    Myofascial pain 12/20/2019   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   Low back pain 10/28/2019   Radiculopathy, lumbar region 10/28/2019   Fatigue 01/15/2019   Chronic systolic (congestive) heart failure (HCC) 09/18/2018   Ischemic cardiomyopathy 09/16/2018   Aortic regurgitation 09/16/2018   Cardiomyopathy, unspecified (HCC) 06/13/2018   Abscess of right axilla 12/12/2017   BMI 33.0-33.9,adult 12/12/2017   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   Acute blood loss anemia 10/25/2017   Acute postoperative respiratory insufficiency 10/25/2017   Postoperative delirium 10/25/2017   Dyslipidemia 10/17/2017   Chest pain in adult 10/11/2017   SOB (shortness of breath) 03/31/2017   Long term current use of aspirin 03/29/2017   Parkinson's disease (HCC) 01/02/2017   Memory change 07/06/2015   Depression, recurrent (HCC) 07/06/2015   Medial meniscus tear 10/11/2011   Neuroma of foot 10/11/2011   Coronary artery disease involving native coronary artery of native heart with angina pectoris (HCC) 12/28/2010   OTHER TESTICULAR HYPOFUNCTION 05/20/2010   Mixed hyperlipidemia 05/20/2010   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/20/2010   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 05/20/2010   GERD 05/20/2010   History of cardiovascular disorder 05/20/2010   NEPHROLITHIASIS, HX OF 05/20/2010   BENIGN PROSTATIC HYPERTROPHY, HX OF, S/P TURP 05/20/2010     Medication Assistance:  None required.  Patient affirms current coverage meets needs. (Has [facility] Medicare and Medicaid coverage)   Assessment / Plan: CHF / hypertension:  Controlled  Continue current medication therapy - Entresto, metoprolol 25mg  once a day and furosemide 20mg  daily as needed.    Parkinson's: Continue Rytary ER per Dr Don Perking directions.   Depression:  Continue  citalopram to 20mg  daily  Medication management:  Reviewed and updated medication list Reviewed refill history and adherence.  Encouraged patient to use weekly pill container to help with medication adherance.  Requested needed meds from [facility] at patient requeset; updated Rx's below. (Metoprolol, Qvar and Entresto had refills)  Meds ordered this encounter  Medications   esomeprazole (NEXIUM) 40  MG capsule    Sig: Take 1 capsule (40 mg total) by mouth daily.    Dispense:  90 capsule    Refill:  0   citalopram (CELEXA) 20 MG tablet    Sig: Take 1 tablet (20 mg total) by mouth daily.    Dispense:  30 tablet    Refill:  1   furosemide (LASIX) 20 MG tablet    Sig: Take 1 tablet (20 mg total) by mouth as needed for fluid or edema (shortness of breath).    Dispense:  30 tablet    Refill:  1   pravastatin (PRAVACHOL) 20 MG tablet    Sig: Take 1 tablet (20 mg total) by mouth daily.    Dispense:  100 tablet    Refill:  3    Follow Up:  4 to 6 weeks; Follow up with PCP April 2025   [name], PharmD Clinical Pharmacist [facility]  - [facility] [phone]

## 2023-09-20 ENCOUNTER — Other Ambulatory Visit: Payer: Self-pay | Admitting: Family Medicine

## 2023-09-20 ENCOUNTER — Ambulatory Visit: Payer: 59 | Admitting: Cardiology

## 2023-09-20 MED ORDER — CITALOPRAM HYDROBROMIDE 20 MG PO TABS: 20.0000 mg | ORAL_TABLET | Freq: Every day | ORAL | 1 refills | Status: DC

## 2023-09-20 MED ORDER — PRAVASTATIN SODIUM 20 MG PO TABS: 20.0000 mg | ORAL_TABLET | Freq: Every day | ORAL | 3 refills | Status: DC

## 2023-09-20 MED ORDER — FUROSEMIDE 20 MG PO TABS: 20.0000 mg | ORAL_TABLET | ORAL | 1 refills | Status: DC | PRN

## 2023-09-27 ENCOUNTER — Encounter: Payer: Self-pay | Admitting: Cardiology

## 2023-09-27 ENCOUNTER — Ambulatory Visit: Payer: 59 | Admitting: Psychology

## 2023-09-27 DIAGNOSIS — F329 Major depressive disorder, single episode, unspecified: Secondary | ICD-10-CM | POA: Diagnosis not present

## 2023-09-27 DIAGNOSIS — F331 Major depressive disorder, recurrent, moderate: Secondary | ICD-10-CM

## 2023-09-27 DIAGNOSIS — F419 Anxiety disorder, unspecified: Secondary | ICD-10-CM | POA: Diagnosis not present

## 2023-09-27 NOTE — Progress Notes (Unsigned)
 Cardiology Office Note:  .   Date:  09/28/2023  ID:  [name], DOB 1946/06/08, MRN [id number] PCP: [name], DO  [facility] Providers Cardiologist:  Norman Herrlich, MD    History of Present Illness: .   Shawn Meza is a 78 y.o. male with a past medical history of HFrEF, CAD s/p CABG x 4 2019, hypertension, LBBB, history of TIA, GERD, history of colon cancer, essential tremor, Parkinson's disease, autonomic dysfunction, dyslipidemia, erectile dysfunction, BPH.  02/24/2023 echo EF 30%, global hypokinesis, mild aortic valve sclerosis 04/13/2022 echo EF 20 to 25%, aortic valve calcified with mild regurgitation and stenosis 01/22/2019 echo EF 35 to 40%, moderate hypokinesis of the left ventricular anteroseptal wall and anterior wall, mild aortic valve regurgitation 07/31/2018 Lexiscan no ischemia, intermediate risk secondary to decreased EF 06/04/2018 echo EF 35 to 40%, moderate hypokinesis of the anteroseptal and anterior myocardium 04/13/2018 carotid duplex minimal calcified plaque 10/22/2017 CABG  x 4  Most recently was evaluated by Dr. Bing Matter on 01/26/2023, he was stable from a cardiac perspective.  Previous discussions were had surrounding biventricular pacing however he did refuse this.  He was participating with PT for his Parkinson's disease and repeat echo was arranged secondary to heaviness when he laid supine which revealed EF of 30%, global hypokinesis, mild aortic valve sclerosis--his Entresto was increased and he was advised to follow-up in 6 months.  He presents today for follow-up of his heart failure and his CAD.  His biggest complaint is fatigue, generalized malaise.  He lives alone with his 2 small dogs, does not have any family nearby.  He does endorse dealing with anxiety and depression, follows with psychiatry for this.  He is bothered by his Parkinson's disease and its progression, follows with Dr. Arbutus Leas in [place].  He denies chest pain,  palpitations, dyspnea, pnd, orthopnea, n, v, dizziness, syncope, edema, weight gain, or early satiety.   ROS: Review of Systems  Constitutional:  Positive for malaise/fatigue.  Neurological:  Positive for tremors.  Psychiatric/Behavioral:  Positive for depression. Negative for suicidal ideas.      Studies Reviewed: .        Cardiac Studies & Procedures   ______________________________________________________________________________________________   STRESS TESTS  MYOCARDIAL PERFUSION IMAGING 07/31/2018  Narrative  Nuclear stress EF: 27%.  Blood pressure demonstrated a normal response to exercise.  Defect 1: There is a medium defect of severe severity present in the basal inferoseptal, basal inferior, mid inferoseptal, mid inferior and apical inferior location.  Findings consistent with prior myocardial infarction.  This is an intermediate risk study.  The left ventricular ejection fraction is severely decreased (<30%).  Cardiomegaly with global hypokinesis. Akinesis of the inferior and infero - septal wall. Diminished LVEF - 27%. No evidence of ischemia.   ECHOCARDIOGRAM  ECHOCARDIOGRAM COMPLETE 02/24/2023  Narrative ECHOCARDIOGRAM REPORT    Patient Name:   Shawn Meza Date of Exam: 02/24/2023 Medical Rec #:  [id number]      Height:       67.0 in Accession #:    [id number]     Weight:       203.6 lb Date of Birth:  [birth year]      BSA:          2.038 m Patient Age:    76 years       BP:           128/72 mmHg Patient Gender: M  HR:           92 bpm. Exam Location:  [facility]  Procedure: 2D Echo, Cardiac Doppler, Color Doppler and Intracardiac Opacification Agent  Indications:    Coronary artery disease involving native coronary artery of native heart with angina pectoris (HCC) [I25.119 (ICD-10-CM)]; Ischemic cardiomyopathy [I25.5 (ICD-10-CM)]  History:        Patient has prior history of Echocardiogram examinations, most recent 04/13/2022.  Cardiomyopathy and CHF, CAD; Prior CABG. Parkinson's disease.  Sonographer:    Margreta Journey RDCS Referring Phys: [id number] Georgeanna Lea   Sonographer Comments: Technically difficult study due to poor echo windows. IMPRESSIONS   1. Left ventricular ejection fraction, by estimation, is 30%%. The left ventricle has moderately decreased function. The left ventricle demonstrates global hypokinesis. Left ventricular diastolic parameters are indeterminate. 2. Right ventricular systolic function is moderately reduced. The right ventricular size is normal. 3. The mitral valve is degenerative. No evidence of mitral valve regurgitation. No evidence of mitral stenosis. 4. The aortic valve is tricuspid. Aortic valve regurgitation is mild. Aortic valve sclerosis is present, with no evidence of aortic valve stenosis. 5. Aortic DTA is NWV.  FINDINGS Left Ventricle: Left ventricular ejection fraction, by estimation, is 30%%. The left ventricle has moderately decreased function. The left ventricle demonstrates global hypokinesis. Definity contrast agent was given IV to delineate the left ventricular endocardial borders. The left ventricular internal cavity size was normal in size. There is no left ventricular hypertrophy. Left ventricular diastolic parameters are indeterminate.  Right Ventricle: The right ventricular size is normal. No increase in right ventricular wall thickness. Right ventricular systolic function is moderately reduced.  Left Atrium: Left atrial size was normal in size.  Right Atrium: Right atrial size was normal in size.  Pericardium: There is no evidence of pericardial effusion.  Mitral Valve: The mitral valve is degenerative in appearance. Mild mitral annular calcification. No evidence of mitral valve regurgitation. No evidence of mitral valve stenosis.  Tricuspid Valve: The tricuspid valve is normal in structure. Tricuspid valve regurgitation is not demonstrated. No  evidence of tricuspid stenosis.  Aortic Valve: The aortic valve is tricuspid. Aortic valve regurgitation is mild. Aortic valve sclerosis is present, with no evidence of aortic valve stenosis.  Pulmonic Valve: The pulmonic valve was normal in structure. Pulmonic valve regurgitation is not visualized. No evidence of pulmonic stenosis.  Aorta: The aortic arch was not well visualized, the aortic root and ascending aorta are structurally normal, with no evidence of dilitation and DTA is NWV.  Venous: The pulmonary veins were not well visualized. The inferior vena cava was not well visualized.  IAS/Shunts: No atrial level shunt detected by color flow Doppler.   LEFT VENTRICLE PLAX 2D LVIDd:         5.50 cm   Diastology LVIDs:         4.55 cm   LV e' medial:    8.16 cm/s LV PW:         1.15 cm   LV E/e' medial:  15.3 LV IVS:        1.20 cm   LV e' lateral:   8.59 cm/s LVOT diam:     2.20 cm   LV E/e' lateral: 14.6 LV SV:         55 LV SV Index:   27 LVOT Area:     3.80 cm   RIGHT VENTRICLE RV Basal diam:  2.20 cm RV Mid diam:    1.70 cm RV S prime:  8.05 cm/s TAPSE (M-mode): 1.2 cm  LEFT ATRIUM             Index        RIGHT ATRIUM          Index LA diam:        4.20 cm 2.06 cm/m   RA Area:     9.25 cm LA Vol (A2C):   38.0 ml 18.65 ml/m  RA Volume:   16.70 ml 8.20 ml/m LA Vol (A4C):   33.2 ml 16.29 ml/m LA Biplane Vol: 38.0 ml 18.65 ml/m AORTIC VALVE LVOT Vmax:   83.80 cm/s LVOT Vmean:  55.567 cm/s LVOT VTI:    0.146 m  AORTA Ao Root diam: 3.60 cm  MITRAL VALVE MV Area (PHT): 4.29 cm     SHUNTS MV Decel Time: 177 msec     Systemic VTI:  0.15 m MV E velocity: 125.00 cm/s  Systemic Diam: 2.20 cm  Norman Herrlich MD Electronically signed by Norman Herrlich MD Signature Date/Time: 02/24/2023/4:53:14 PM    Final          ______________________________________________________________________________________________      Risk Assessment/Calculations:      HYPERTENSION CONTROL Vitals:   09/28/23 1111 09/28/23 1604  BP: (!) 140/70 (!) 140/70    The patient's blood pressure is elevated above target today.  In order to address the patient's elevated BP: Blood pressure will be monitored at home to determine if medication changes need to be made.          Physical Exam:   VS:  BP (!) 140/70   Pulse 75   Ht 5\' 6"  (1.676 m)   Wt 206 lb (93.4 kg)   SpO2 96%   BMI 33.25 kg/m    Wt Readings from Last 3 Encounters:  09/28/23 206 lb (93.4 kg)  04/20/23 204 lb (92.5 kg)  04/05/23 203 lb 6.4 oz (92.3 kg)    GEN: Well nourished, well developed in no acute distress NECK: No JVD; No carotid bruits CARDIAC: RRR, no murmurs, rubs, gallops RESPIRATORY:  Clear to auscultation without rales, wheezing or rhonchi  ABDOMEN: Soft, non-tender, non-distended EXTREMITIES:  No edema; No deformity   ASSESSMENT AND PLAN: .   HFrEF -NYHA class II, euvolemic.  Most recent echo in July 2024 revealed an EF of 30%.  He previously refused biventricular device.  Continue Entresto 49-51 mg twice daily, continue metoprolol 25 mg daily.  Will provide him with samples of Jardiance and he will begin to take Jardiance 10 mg daily.  Continue Lasix as needed for pedal edema or shortness of breath.  CAD -CABG x 4 in 2019 >> Lexiscan later that year revealed no ischemia.  He is not having any anginal complaints today.  Continue aspirin 81 mg daily, continue metoprolol 25 mg daily, continue nitroglycerin as needed, continue Pravachol 20 mg daily.  Dyslipidemia-will repeat LFTs and direct LDL today.  Would prefer his LDL to be at least less than 70.  Previously intolerant of rosuvastatin.  For now, continue Pravachol 20 mg daily.  Hypertension-blood pressure slightly elevated 140/70, however we should likely allow for permissive hypertension secondary to autonomic dysfunction that accompanies his Parkinson disease.  Continue Entresto 49-51 mg twice daily, continue metoprolol 25  mg daily.       Dispo: Provide Jardiance samples, start Jardiance 10 mg daily.  CMET, direct LDL today.  Follow-up in 3 months.  Signed, Flossie Dibble, NP
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
## 2023-09-28 ENCOUNTER — Encounter: Payer: Self-pay | Admitting: Cardiology

## 2023-09-28 ENCOUNTER — Ambulatory Visit: Payer: 59 | Attending: Cardiology | Admitting: Cardiology

## 2023-09-28 VITALS — BP 140/70 | HR 75 | Ht 66.0 in | Wt 206.0 lb

## 2023-09-28 DIAGNOSIS — I10 Essential (primary) hypertension: Secondary | ICD-10-CM | POA: Diagnosis not present

## 2023-09-28 DIAGNOSIS — I5020 Unspecified systolic (congestive) heart failure: Secondary | ICD-10-CM

## 2023-09-28 DIAGNOSIS — I25119 Atherosclerotic heart disease of native coronary artery with unspecified angina pectoris: Secondary | ICD-10-CM | POA: Diagnosis not present

## 2023-09-28 DIAGNOSIS — E785 Hyperlipidemia, unspecified: Secondary | ICD-10-CM

## 2023-09-28 DIAGNOSIS — Z951 Presence of aortocoronary bypass graft: Secondary | ICD-10-CM

## 2023-09-28 MED ORDER — EMPAGLIFLOZIN 10 MG PO TABS: 10.0000 mg | ORAL_TABLET | Freq: Every day | ORAL | Status: DC

## 2023-09-28 MED ORDER — EMPAGLIFLOZIN 10 MG PO TABS: 10.0000 mg | ORAL_TABLET | Freq: Every day | ORAL | 3 refills | Status: DC

## 2023-09-28 NOTE — Patient Instructions (Signed)
 Medication Instructions:   START: Jardiance 10mg  1 tablet daily  START: Melatonin 3mg  at bedtime   Lab Work: CMP, Direct LDL- today If you have labs (blood work) drawn today and your tests are completely normal, you will receive your results only by: MyChart Message (if you have MyChart) OR A paper copy in the mail If you have any lab test that is abnormal or we need to change your treatment, we will call you to review the results.   Testing/Procedures: None Ordered   Follow-Up: At [facility], you and your health needs are our priority.  As part of our continuing mission to provide you with exceptional heart care, we have created designated Provider Care Teams.  These Care Teams include your primary Cardiologist (physician) and Advanced Practice Providers (APPs -  Physician Assistants and Nurse Practitioners) who all work together to provide you with the care you need, when you need it.  We recommend signing up for the patient portal called "MyChart".  Sign up information is provided on this After Visit Summary.  MyChart is used to connect with patients for Virtual Visits (Telemedicine).  Patients are able to view lab/test results, encounter notes, upcoming appointments, etc.  Non-urgent messages can be sent to your provider as well.   To learn more about what you can do with MyChart, go to [email or web address].    Your next appointment:   3 month(s)  The format for your next appointment:   In Person  Provider:   Wallis Bamberg, NP   Other Instructions NA

## 2023-09-29 DIAGNOSIS — I25119 Atherosclerotic heart disease of native coronary artery with unspecified angina pectoris: Secondary | ICD-10-CM

## 2023-09-29 LAB — COMPREHENSIVE METABOLIC PANEL WITH GFR
ALT: 25 IU/L (ref 0–44)
AST: 22 IU/L (ref 0–40)
Albumin: 4.4 g/dL (ref 3.8–4.8)
Alkaline Phosphatase: 74 IU/L (ref 44–121)
BUN/Creatinine Ratio: 17 (ref 10–24)
BUN: 16 mg/dL (ref 8–27)
Bilirubin Total: 0.4 mg/dL (ref 0.0–1.2)
CO2: 24 mmol/L (ref 20–29)
Calcium: 9.4 mg/dL (ref 8.6–10.2)
Chloride: 104 mmol/L (ref 96–106)
Creatinine, Ser: 0.95 mg/dL (ref 0.76–1.27)
Globulin, Total: 2.4 g/dL (ref 1.5–4.5)
Glucose: 94 mg/dL (ref 70–99)
Potassium: 4.3 mmol/L (ref 3.5–5.2)
Sodium: 145 mmol/L — ABNORMAL HIGH (ref 134–144)
Total Protein: 6.8 g/dL (ref 6.0–8.5)
eGFR: 81 mL/min/1.73

## 2023-09-29 LAB — LDL CHOLESTEROL, DIRECT: LDL Direct: 112 mg/dL — ABNORMAL HIGH (ref 0–99)

## 2023-09-29 MED ORDER — PRAVASTATIN SODIUM 80 MG PO TABS: 80.0000 mg | ORAL_TABLET | Freq: Every evening | ORAL | 3 refills | Status: AC

## 2023-10-11 ENCOUNTER — Ambulatory Visit: Payer: 59 | Admitting: Psychology

## 2023-10-11 DIAGNOSIS — F329 Major depressive disorder, single episode, unspecified: Secondary | ICD-10-CM

## 2023-10-11 DIAGNOSIS — F331 Major depressive disorder, recurrent, moderate: Secondary | ICD-10-CM

## 2023-10-11 DIAGNOSIS — F419 Anxiety disorder, unspecified: Secondary | ICD-10-CM | POA: Diagnosis not present
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
## 2023-10-12 NOTE — Telephone Encounter (Signed)
 Called and spoke to patient who reports that Shawn Meza was causing him to have GI upset and he has stopped taking it. Made Wallis Bamberg, NP aware that patient has stopped Jardiance. She reports that patient can discontinue use of Jardiance, and recommends patient to track weight closely and use Lasix as needed. Made patient aware of Jimmy Footman recommendations.  He verbalized understanding and had no additional questions

## 2023-10-12 NOTE — Addendum Note (Signed)
 Addended by: Lonia Farber on: 10/12/2023 10:04 AM   Modules accepted: Orders

## 2023-10-19 NOTE — Progress Notes (Unsigned)
 Assessment/Plan:   1.  Parkinsons Disease  -Clinically, he is always worse when he is off of levodopa.  He has tried nearly every form of levodopa on the market. However, he often doesn't take med or doesn't take it as directed.  This continues with his current medication, which we have increased, but ultimately the real issue is that he does not take it as directed.  He has not filled his Rytary since 04/21/23 (6 months ago).  Its generally taking him 6 months to go through 3 months worth of medication.  He and I discussed the importance of compliance and taking it tid dosing.    2.  Depression  -Supposed to be on bupropion, 75 mg twice per day, but is only taking half a tablet daily  -Continues to follow with Dr. Dellia Cloud.  He was last seen on September 11.  He is very happy with that care and we appreciate it as well  3.  B12 deficiency  -On supplementation.  4.  Lumbar spinal stenosis  -has followed with Dr. Venetia Maxon and [place]  -Has seen Dr. Jordan Likes   5.  Insomnia  -f/u with primary care  6.  SOB  `-Patient previously attributed this to Rytary, but cardiology visit note clearly that patient has severe cardiomyopathy and not taking his diuretic, which has resulted in more edema.   Subjective:   Shawn Meza was seen today in follow up for Parkinsons disease.  My previous records were reviewed prior to todays visit as well as outside records available to me.  Patient is supposed to be on Rytary 245 mg 3 times per day.  It was last filled in September, 2024 per pharmacy records.  Current prescribed movement disorder medications: Rytary, 245 mg, 1 po tid  B12 supplement Melatonin, 3 mg  Prior medications: Carbidopa/levodopa 25/100 IR: Carbidopa/levodopa 25/100 CR: Carbidopa/levodopa 50/200 CR; rytary (c/o dry mouth, trouble breathing at night and constipation); Trintellix (he thought it caused dreams, even though we told him that it was the Parkinsons that was causing the  dreaming)   ALLERGIES:   Allergies  Allergen Reactions   Tizanidine Hcl Hives   Fluoxetine Other (See Comments)    Caused depression and aggression   Rosuvastatin Other (See Comments)    Whole body aches   Testosterone Other (See Comments)    ABDOMINAL PAIN and cramping   Ropinirole Hcl Nausea Only   Requip [Ropinirole] Nausea Only    CURRENT MEDICATIONS:  Outpatient Encounter Medications as of 10/20/2023  Medication Sig   acetaminophen (TYLENOL) 325 MG tablet Take 162.5 mg by mouth every 6 (six) hours as needed for mild pain or moderate pain.   aspirin 81 MG EC tablet Take 1 tablet (81 mg total) by mouth daily.   beclomethasone (QVAR REDIHALER) 40 MCG/ACT inhaler Inhale 2 puffs into the lungs 2 (two) times daily.   Carbidopa-Levodopa ER (RYTARY) 61.25-245 MG CPCR Take 1 capsule by mouth 3 (three) times daily.   citalopram (CELEXA) 20 MG tablet Take 1 tablet (20 mg total) by mouth daily.   esomeprazole (NEXIUM) 40 MG capsule Take 1 capsule (40 mg total) by mouth daily.   fluticasone (FLONASE) 50 MCG/ACT nasal spray Place 2 sprays into both nostrils daily.   furosemide (LASIX) 20 MG tablet Take 1 tablet (20 mg total) by mouth as needed for fluid or edema (shortness of breath).   gabapentin (NEURONTIN) 100 MG capsule Take 1 capsule (100 mg total) by mouth at bedtime.  levocetirizine (XYZAL) 5 MG tablet Take 1 tablet (5 mg total) by mouth every evening. For allergies   metoprolol tartrate (LOPRESSOR) 25 MG tablet Take 1 tablet (25 mg total) by mouth daily.   montelukast (SINGULAIR) 10 MG tablet Take 1 tablet (10 mg total) by mouth at bedtime.   nitroGLYCERIN (NITROSTAT) 0.4 MG SL tablet Place 1 tablet (0.4 mg total) under the tongue every 5 (five) minutes x 3 doses as needed for chest pain. If chest pain is not relieved after 2nd dose - call 911.   pravastatin (PRAVACHOL) 80 MG tablet Take 1 tablet (80 mg total) by mouth every evening.   sacubitril-valsartan (ENTRESTO) 49-51 MG Take 1  tablet by mouth 2 (two) times daily.   No facility-administered encounter medications on file as of 10/20/2023.    Objective:   PHYSICAL EXAMINATION:    VITALS:   There were no vitals filed for this visit.   GEN:  The patient appears stated age and is in NAD. HEENT:  Normocephalic, atraumatic.  The mucous membranes are moist.   Neurological examination:  Orientation: The patient is alert and oriented x3. Cranial nerves: There is good facial symmetry with facial hypomimia. The speech is fluent and clear. Soft palate rises symmetrically and there is no tongue deviation. Hearing is intact to conversational tone. Sensation: Sensation is intact to light touch throughout Motor: Strength is at least antigravity x4.  Movement examination: Tone: There is mild increased tone in the LUE Abnormal movements: there is LUE>RUE rest tremor and L>RLE rest tremor Coordination:  There is mild decremation, with any form of RAMS, including alternating supination and pronation of the forearm, hand opening and closing, finger taps, heel taps and toe taps on the L Gait and Station: The patient is a bit slow to arise.  Patient has decreased arm swing on the L  I have reviewed and interpreted the following labs independently    Chemistry      Component Value Date/Time   NA 145 (H) 09/28/2023 1209   K 4.3 09/28/2023 1209   CL 104 09/28/2023 1209   CO2 24 09/28/2023 1209   BUN 16 09/28/2023 1209   CREATININE 0.95 09/28/2023 1209   CREATININE 1.03 05/20/2015 1410      Component Value Date/Time   CALCIUM 9.4 09/28/2023 1209   ALKPHOS 74 09/28/2023 1209   AST 22 09/28/2023 1209   ALT 25 09/28/2023 1209   BILITOT 0.4 09/28/2023 1209       Lab Results  Component Value Date   WBC 9.3 11/28/2022   HGB 16.3 11/28/2022   HCT 47.9 11/28/2022   MCV 95.1 11/28/2022   PLT 206.0 11/28/2022    Lab Results  Component Value Date   TSH 1.910 03/31/2022   Total time spent on today's visit was ***  minutes, including both face-to-face time and nonface-to-face time.  Time included that spent on review of records (prior notes available to me/labs/imaging if pertinent), discussing treatment and goals, answering patient's questions and coordinating care.   Cc:  [name], DO

## 2023-10-20 ENCOUNTER — Ambulatory Visit (INDEPENDENT_AMBULATORY_CARE_PROVIDER_SITE_OTHER): Payer: 59 | Admitting: Neurology

## 2023-10-20 VITALS — BP 126/74 | HR 74 | Wt 203.6 lb

## 2023-10-20 DIAGNOSIS — G20A1 Parkinson's disease without dyskinesia, without mention of fluctuations: Secondary | ICD-10-CM

## 2023-10-20 MED ORDER — RYTARY 61.25-245 MG PO CPCR
ORAL_CAPSULE | ORAL | Status: DC
Start: 2023-10-20 — End: 2023-10-30

## 2023-10-20 NOTE — Patient Instructions (Signed)
 Increase rytary 245 mg, 1 FOUR times per day, with the last one being at bedtime

## 2023-10-25 ENCOUNTER — Ambulatory Visit (INDEPENDENT_AMBULATORY_CARE_PROVIDER_SITE_OTHER): Payer: 59 | Admitting: Psychology

## 2023-10-25 DIAGNOSIS — F419 Anxiety disorder, unspecified: Secondary | ICD-10-CM

## 2023-10-25 DIAGNOSIS — F329 Major depressive disorder, single episode, unspecified: Secondary | ICD-10-CM

## 2023-10-25 DIAGNOSIS — F331 Major depressive disorder, recurrent, moderate: Secondary | ICD-10-CM
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
## 2023-10-30 ENCOUNTER — Other Ambulatory Visit: Payer: Self-pay | Admitting: Cardiology

## 2023-10-30 ENCOUNTER — Ambulatory Visit (INDEPENDENT_AMBULATORY_CARE_PROVIDER_SITE_OTHER): Payer: 59 | Admitting: Pharmacist

## 2023-10-30 ENCOUNTER — Other Ambulatory Visit: Payer: Self-pay

## 2023-10-30 DIAGNOSIS — J309 Allergic rhinitis, unspecified: Secondary | ICD-10-CM

## 2023-10-30 DIAGNOSIS — Z79899 Other long term (current) drug therapy: Secondary | ICD-10-CM

## 2023-10-30 DIAGNOSIS — G20A1 Parkinson's disease without dyskinesia, without mention of fluctuations: Secondary | ICD-10-CM

## 2023-10-30 DIAGNOSIS — J3489 Other specified disorders of nose and nasal sinuses: Secondary | ICD-10-CM

## 2023-10-30 MED ORDER — MONTELUKAST SODIUM 10 MG PO TABS: 10.0000 mg | ORAL_TABLET | Freq: Every day | ORAL | 1 refills | Status: DC

## 2023-10-30 MED ORDER — QVAR REDIHALER 40 MCG/ACT IN AERB: 2.0000 | INHALATION_SPRAY | Freq: Two times a day (BID) | RESPIRATORY_TRACT | 2 refills | Status: DC

## 2023-10-30 MED ORDER — LEVOCETIRIZINE DIHYDROCHLORIDE 5 MG PO TABS: 5.0000 mg | ORAL_TABLET | Freq: Every evening | ORAL | 1 refills | Status: DC

## 2023-10-30 MED ORDER — RYTARY 61.25-245 MG PO CPCR: ORAL_CAPSULE | ORAL | 0 refills | Status: DC

## 2023-10-30 NOTE — Progress Notes (Signed)
 Pharmacy Note  10/30/2023 Name: Shawn Meza MRN: [id number] DOB: 19-Feb-1946  Subjective: Shawn Meza is a 78 y.o. year old male who is a primary care patient of [name], DO. Clinical Pharmacist Practitioner referral was placed to assist with medication management.    Engaged with patient by telephone for follow up visit today.  Upcoming appointments:  Cardio - Dr Bing Matter  /  [facility]. [facility] 12/26/2023 Neuro - Dr [name] - 04/23/2024 PCP / [name] and AWV - 11/29/2023  Medication Management:  Patient has been noted in the past to take medications inconsistently. He reports he is taking medications as prescribed.   Patient is using weekly pill container to help with remembering to take medication.  He also asks for pharmacy to print what medication is for on the label.   Reviewed refill history and med list with patient.  beclomethasone inhaler Last refill 09/20/2023 - 30 days  - refill needed Fluticasone nasel spray - 09/20/2023 - 30 days - refill needed.  Rytary Last refilled 04/21/2023 for 90 days supply - refill needed - new dose citalopram - last refill 09/20/2023 for 30 DS  - refill needed gabapentin - last refill 04/10/2023  #100 - refill not needed per patient  Levocetirizine 5mg  daily  -last refilled 07/10/2023 for 90 days - refill needed.  metoprolol - last refill 09/20/2023 - 100 DS (also had 100 DS filled 11/02/2022 - Per patient refill needed.  montelukast - last refill 07/09/2024 - 90 day supply - refill not needed Pravastatin - received refills for 100 day supply 05/02/2023 -  per patient refill needed Entresto - last refill was for 30 DS 07/10/2023  - per patient refill needed  Patient reports he needs a refill for Nexium - this was taken off his medication list in past with notation that he stopped taking on his own.   He states that he often has dry mouth - especially at night.  He has not tried anything for dry mouth.   Depression / GAD:  Current  therapy: citalopram 20mg  daily.  Patient reports fewer mood swings and anger episodes. He is taking at night because he feels the citalopram makes him sleepy.  He is seeing counselor regularly.   CHF:  ECHO 02/24/2023 - EF improved but still low at 30%. Dr Bing Matter recommended doubling dose of Entresto to 49/51mg  twice a day.  Current therapy:  furosemide 20mg  daily if needed; metoprolol tartrate 25mg  daily and Entresto - 49/51mg   twice a day,   Past medications Tried: Jardiance 10mg  daily (started 09/28/2023) - stopped 10/02/2023 because patient reported GI upset (today patient reports that he is not sure if GI upset was related to a "bug" or the medications  Patient denies shortness of breath or increase in weight.  He is checking weight 2 or 3 times per week. Patient report weight has been stable.  Reports occsional edema in lower extremities that is relieved with as needed furosemide  Parkinson's Disease:  Current therapy - Rytary 4 times a day (dose increased 10/18/23 after his last appointment with Dr [name]). Patient states he does not have a Rytary Rx with new instructions. Called his pharmacy and they do not have an active Rx - looks like the last Rx was printed but patient does not have at home.   Patient mentions that he has an area in the corner of his eye that is crusty. He states it is not from "matter" discharge from the eye but he  feels is abnormal skin growth.   Objective: Review of patient status, including review of consultants reports, laboratory and other test data, was performed as part of comprehensive.  Lab Results  Component Value Date   CREATININE 0.95 09/28/2023   CREATININE 1.05 04/05/2023   CREATININE 1.13 11/28/2022    Lab Results  Component Value Date   HGBA1C 5.8 11/28/2022       Component Value Date/Time   CHOL 166 11/28/2022 1424   CHOL 163 03/31/2022 1708   TRIG 144.0 11/28/2022 1424   TRIG 107 05/09/2010 0000   HDL 36.70 (L) 11/28/2022 1424    HDL 36 (L) 03/31/2022 1708   CHOLHDL 5 11/28/2022 1424   VLDL 28.8 11/28/2022 1424   LDLCALC 100 (H) 11/28/2022 1424   LDLCALC 91 03/31/2022 1708   LDLDIRECT 112 (H) 09/28/2023 1209     Clinical ASCVD: Yes  The ASCVD Risk score (Arnett DK, et al., 2019) failed to calculate for the following reasons:   Risk score cannot be calculated because patient has a medical history suggesting prior/existing ASCVD    BP Readings from Last 3 Encounters:  10/20/23 126/74  09/28/23 (!) 140/70  04/20/23 124/86     Allergies  Allergen Reactions   Tizanidine Hcl Hives   Fluoxetine Other (See Comments)    Caused depression and aggression   Rosuvastatin Other (See Comments)    Whole body aches   Testosterone Other (See Comments)    ABDOMINAL PAIN and cramping   Ropinirole Hcl Nausea Only   Requip [Ropinirole] Nausea Only    Medications Reviewed Today     Reviewed by [name], RPH-CPP (Pharmacist) on 10/30/23 at 1420  Med List Status: <None>   Medication Order Taking? Sig Documenting Provider Last Dose Status Informant  acetaminophen (TYLENOL) 325 MG tablet [id number]  Take 162.5 mg by mouth every 6 (six) hours as needed for mild pain or moderate pain. [provider]  Active   aspirin 81 MG EC tablet [id number] No Take 1 tablet (81 mg total) by mouth daily.  Patient not taking: Reported on 10/30/2023   [name], DO Not Taking Active   beclomethasone (QVAR REDIHALER) 40 MCG/ACT inhaler [id number] Yes Inhale 2 puffs into the lungs 2 (two) times daily. [name], DO Taking Active   Carbidopa-Levodopa ER (RYTARY) 61.25-245 MG CPCR [id number] Yes 1 four times per day, the last at bedtime [name], Octaviano Batty, DO Taking Active   citalopram (CELEXA) 20 MG tablet [id number] Yes Take 1 tablet (20 mg total) by mouth daily. [name], DO Taking Active   empagliflozin (JARDIANCE) 10 MG TABS tablet [id number] No Take 10 mg by mouth daily.  Patient not taking:  Reported on 10/30/2023   [provider] Not Taking Active   esomeprazole (NEXIUM) 40 MG capsule [id number] Yes Take 1 capsule (40 mg total) by mouth daily. [name], DO Taking Active   fluticasone (FLONASE) 50 MCG/ACT nasal spray [id number] Yes Place 2 sprays into both nostrils daily. [name], DO Taking Active   furosemide (LASIX) 20 MG tablet [id number] Yes Take 1 tablet (20 mg total) by mouth as needed for fluid or edema (shortness of breath). [name], DO Taking Active   gabapentin (NEURONTIN) 100 MG capsule [id number]  Take 1 capsule (100 mg total) by mouth at bedtime. [name], DO  Active   levocetirizine (XYZAL) 5 MG tablet [id number] Yes Take 1 tablet (5 mg total) by mouth every evening. For allergies Wendling,  Jilda Roche, DO Taking Active   metoprolol tartrate (LOPRESSOR) 25 MG tablet [id number] Yes Take 1 tablet (25 mg total) by mouth daily. Baldo Daub, MD Taking Active   montelukast (SINGULAIR) 10 MG tablet [id number] Yes Take 1 tablet (10 mg total) by mouth at bedtime. [name], DO Taking Active   nitroGLYCERIN (NITROSTAT) 0.4 MG SL tablet [id number]  Place 1 tablet (0.4 mg total) under the tongue every 5 (five) minutes x 3 doses as needed for chest pain. If chest pain is not relieved after 2nd dose - call 911. [name], DO  Active   pravastatin (PRAVACHOL) 80 MG tablet [id number] Yes Take 1 tablet (80 mg total) by mouth every evening. Flossie Dibble, NP Taking Active   sacubitril-valsartan (ENTRESTO) 49-51 MG [id number] Yes Take 1 tablet by mouth 2 (two) times daily. Georgeanna Lea, MD Taking Active             Patient Active Problem List   Diagnosis Date Noted   Major depressive disorder, recurrent episode, moderate (HCC) 06/30/2022   Lumbar spondylosis 06/29/2022   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   Diarrhea 11/04/2020   Rectal bleeding 11/04/2020   Trochanteric  bursitis of right hip    Transient ischemic attack    NSTEMI (non-ST elevated myocardial infarction) (HCC)    Nephrolithiasis    Metabolic syndrome    Major depression, chronic    Long-term use of aspirin therapy    Hypercholesterolemia    Hiatal hernia    GERD (gastroesophageal reflux disease)    Essential tremor    Essential hypertension    Erectile dysfunction    Elevated PSA    Colon cancer (HCC)    Chronic coronary artery disease    Arthritis    Myofascial pain 12/20/2019   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   Low back pain 10/28/2019   Radiculopathy, lumbar region 10/28/2019   Fatigue 01/15/2019   Chronic systolic (congestive) heart failure (HCC) 09/18/2018   Ischemic cardiomyopathy 09/16/2018   Aortic regurgitation 09/16/2018   Cardiomyopathy, unspecified (HCC) 06/13/2018   Abscess of right axilla 12/12/2017   BMI 33.0-33.9,adult 12/12/2017   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   Acute blood loss anemia 10/25/2017   Acute postoperative respiratory insufficiency 10/25/2017   Postoperative delirium 10/25/2017   Dyslipidemia 10/17/2017   Chest pain in adult 10/11/2017   SOB (shortness of breath) 03/31/2017   Long term current use of aspirin 03/29/2017   Parkinson's disease (HCC) 01/02/2017   Memory change 07/06/2015   Depression, recurrent (HCC) 07/06/2015   Medial meniscus tear 10/11/2011   Neuroma of foot 10/11/2011   Coronary artery disease involving native coronary artery of native heart with angina pectoris (HCC) 12/28/2010   OTHER TESTICULAR HYPOFUNCTION 05/20/2010   Mixed hyperlipidemia 05/20/2010   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/20/2010   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 05/20/2010   GERD 05/20/2010   History of cardiovascular disorder 05/20/2010   NEPHROLITHIASIS, HX OF 05/20/2010   BENIGN PROSTATIC HYPERTROPHY, HX OF, S/P TURP 05/20/2010     Medication Assistance:  None required.  Patient  affirms current coverage meets needs. (Has [facility] and Medicaid coverage)   Assessment / Plan: CHF / hypertension:  Controlled  Continue current medication therapy - Entresto, metoprolol 25mg  once a day and furosemide 20mg  daily as needed.   Patient will retry Jardaince 10mg  daily - will recheck BMET in 2 to 4 weeks.   Parkinson's:  Continue Rytary ER per Dr Don Perking directions. Called Dr Don Perking office - requested updated Rx to be sent to his pharmacy from DR [name]'s office.   Depression:  Continue citalopram to 20mg  daily  Medication management:  Reviewed and updated medication list Reviewed refill history and adherence.  Encouraged patient to use weekly pill container to help with medication adherance.  Requested needed meds from [facility] at patient requeset; updated Rx's below.   Meds ordered this encounter  Medications   levocetirizine (XYZAL) 5 MG tablet    Sig: Take 1 tablet (5 mg total) by mouth every evening. For allergies    Dispense:  90 tablet    Refill:  1   montelukast (SINGULAIR) 10 MG tablet    Sig: Take 1 tablet (10 mg total) by mouth at bedtime.    Dispense:  90 tablet    Refill:  1   beclomethasone (QVAR REDIHALER) 40 MCG/ACT inhaler    Sig: Inhale 2 puffs into the lungs 2 (two) times daily.    Dispense:  1 each    Refill:  2   Regarding concerning growth on his eye. Will consult with Dr [name] to see if he recommends patient come in or refer to either dermatology or ophthalmology.   Follow Up:  4 to 6 weeks; Follow up with PCP May 2025.    [name], PharmD Clinical Pharmacist [facility]  - [facility] [phone]

## 2023-10-31 ENCOUNTER — Other Ambulatory Visit (HOSPITAL_COMMUNITY): Payer: Self-pay

## 2023-10-31 ENCOUNTER — Telehealth: Payer: Self-pay

## 2023-10-31 NOTE — Telephone Encounter (Signed)
 Prescription sent to pharmacy.

## 2023-10-31 NOTE — Progress Notes (Signed)
 Patient scheduled for 11/01/23

## 2023-10-31 NOTE — Telephone Encounter (Signed)
 Pharmacy Patient Advocate Encounter   Received notification from Pt Calls Messages that prior authorization for Rytary 61.25-245MG  er capsules is required/requested.   Insurance verification completed.   The patient is insured through [facility] .   Per test claim: PA required and submitted KEY/EOC/Request #: [id number] APPROVED from 10/31/2023 to 07/31/2024. Ran test claim, Copay is $0.00. This test claim was processed through [facility]- copay amounts may vary at other pharmacies due to pharmacy/plan contracts, or as the patient moves through the different stages of their insurance plan.

## 2023-11-01 ENCOUNTER — Ambulatory Visit: Admitting: Family Medicine

## 2023-11-01 ENCOUNTER — Encounter: Payer: Self-pay | Admitting: Family Medicine

## 2023-11-01 VITALS — BP 124/78 | HR 81 | Temp 98.1°F | Ht 66.0 in | Wt 208.0 lb

## 2023-11-01 DIAGNOSIS — G8929 Other chronic pain: Secondary | ICD-10-CM

## 2023-11-01 DIAGNOSIS — M25561 Pain in right knee: Secondary | ICD-10-CM

## 2023-11-01 DIAGNOSIS — L989 Disorder of the skin and subcutaneous tissue, unspecified: Secondary | ICD-10-CM | POA: Diagnosis not present

## 2023-11-01 DIAGNOSIS — J302 Other seasonal allergic rhinitis: Secondary | ICD-10-CM

## 2023-11-01 DIAGNOSIS — M25562 Pain in left knee: Secondary | ICD-10-CM | POA: Diagnosis not present

## 2023-11-01 MED ORDER — METHYLPREDNISOLONE ACETATE 40 MG/ML IJ SUSP
40.0000 mg | Freq: Once | INTRAMUSCULAR | Status: AC
  Administered 2023-11-01: 40 mg via INTRAMUSCULAR

## 2023-11-01 MED ORDER — AZELASTINE HCL 0.1 % NA SOLN: 2.0000 | Freq: Two times a day (BID) | NASAL | 12 refills | Status: AC

## 2023-11-01 NOTE — Patient Instructions (Signed)
If you do not hear anything about your referral in the next 1-2 weeks, call our office and ask for an update.  Ice/cold pack over area for 10-15 min twice daily.  Heat (pad or rice pillow in microwave) over affected area, 10-15 minutes twice daily.   OK to take Tylenol 1000 mg (2 extra strength tabs) or 975 mg (3 regular strength tabs) every 6 hours as needed.  Let us know if you need anything.

## 2023-11-01 NOTE — Progress Notes (Signed)
 Chief Complaint  Patient presents with   Eye Problem    Patient presents today for eye check.    Shawn Meza is a 78 y.o. male here for a skin complaint.  Duration: 4 months Location: inner L eye Pruritic? No Painful? No Drainage? No New soaps/lotions/topicals/detergents? No Trauma? No Other associated symptoms: affects vision slightly Therapies tried thus far: baby shampoo  Patient has a history of nasal drainage and postnasal drip leading to cough.  He is currently taking Flonase, Xyzal, and montelukast which do help.  He reports compliance and no adverse effects.  Worsening over the past few weeks.  Pollen season recently started.  No sinus pain, fevers, cough, shortness of breath.  Patient has a history of bilateral knee pain, worse on the left.  Previous imaging shows degeneration.  He used to get steroid injections which did help.  There was a miscommunication with his most recent provider and he did not get them.  He is interested in getting them today.  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   Diarrhea 11/04/2020   Elevated PSA    Erectile dysfunction    Essential hypertension    Essential tremor    GERD (gastroesophageal reflux disease)    Headache(784.0)    Hiatal hernia    Hypercholesterolemia    LBBB (left bundle branch block)    Long-term use of aspirin therapy    Low back pain 10/28/2019   Major depression, chronic    Medial meniscus tear 10/11/2011   Metabolic syndrome    Morbid obesity (HCC)    Myofascial pain 12/20/2019   Nephrolithiasis    hx of   NSTEMI (non-ST elevated myocardial infarction) (HCC)    Parkinson's disease (HCC)    S/P CABG (coronary artery bypass graft)    Transient ischemic attack    hx of   Trochanteric bursitis of right hip     BP 124/78   Pulse 81   Temp 98.1 F  (36.7 C)   Ht 5\' 6"  (1.676 m)   Wt 208 lb (94.3 kg)   SpO2 96%   BMI 33.57 kg/m  Gen: awake, alert, appearing stated age Nose: Nares are patent without rhinorrhea, no sinus TTP, MMM, no pharyngeal exudate or erythema Heart: RRR Lungs: CTAB.  No accessory muscle use Skin: See below. No drainage, erythema, TTP, fluctuance, excoriation MSK: Normal active and passive range of motion of the knees, no deformity, edema, effusion, excessive warmth, erythema, or ecchymosis. Psych: Age appropriate judgment and insight    Procedure Note; Knee injection x 2 Verbal consent obtained. The area of the antero-lateral joint line was palpated and cleaned with alcohol x1. A 27-gauge needle was used to enter the joint space anterolaterally with ease. 40 mg of Depomedrol with 2 mL of 1% lidocaine was injected. A Band-Aid was placed. This process was repeated on the contralateral side. The patient tolerated the procedure well. There were no complications noted.  Seasonal allergies - Plan: azelastine (ASTELIN) 0.1 % nasal spray  Chronic pain of both knees - Plan: methylPREDNISolone acetate (DEPO-MEDROL) injection 40 mg, methylPREDNISolone acetate (DEPO-MEDROL) injection 40 mg, PR ARTHROCENTESIS ASPIR&/INJ MAJOR JT/BURSA W/O [place]  Skin lesion - Plan: Ambulatory referral to Dermatology  Chronic, not controlled.  Add Astelin twice daily.  Continue Flonase, Xyzal 5 mg daily, montelukast 10  mg daily.  Follow-up in 1 month if no improvement. Chronic, not controlled.  Bilateral steroid injections today.  Hopefully these last was 3 months.  Ice, heat, Tylenol. Refer to dermatology for their opinion. F/u prn. The patient voiced understanding and agreement to the plan.  Jilda Roche Walls, DO 11/01/23 4:35 PM

## 2023-11-02 ENCOUNTER — Ambulatory Visit: Admitting: Physical Therapy

## 2023-11-02 ENCOUNTER — Ambulatory Visit

## 2023-11-03 ENCOUNTER — Other Ambulatory Visit (HOSPITAL_COMMUNITY): Payer: Self-pay

## 2023-11-06 ENCOUNTER — Other Ambulatory Visit (HOSPITAL_COMMUNITY): Payer: Self-pay

## 2023-11-06 ENCOUNTER — Ambulatory Visit: Attending: Neurology | Admitting: Physical Therapy

## 2023-11-06 ENCOUNTER — Ambulatory Visit

## 2023-11-06 ENCOUNTER — Other Ambulatory Visit: Payer: Self-pay

## 2023-11-06 ENCOUNTER — Telehealth: Payer: Self-pay | Admitting: Pharmacy Technician

## 2023-11-06 DIAGNOSIS — R2689 Other abnormalities of gait and mobility: Secondary | ICD-10-CM

## 2023-11-06 DIAGNOSIS — G20A1 Parkinson's disease without dyskinesia, without mention of fluctuations: Secondary | ICD-10-CM | POA: Insufficient documentation

## 2023-11-06 DIAGNOSIS — R471 Dysarthria and anarthria: Secondary | ICD-10-CM | POA: Insufficient documentation

## 2023-11-06 DIAGNOSIS — M6281 Muscle weakness (generalized): Secondary | ICD-10-CM

## 2023-11-06 DIAGNOSIS — R2681 Unsteadiness on feet: Secondary | ICD-10-CM

## 2023-11-06 DIAGNOSIS — R29818 Other symptoms and signs involving the nervous system: Secondary | ICD-10-CM | POA: Diagnosis not present

## 2023-11-06 NOTE — Therapy (Signed)
 OUTPATIENT PHYSICAL THERAPY NEURO EVALUATION   Patient Name: Shawn Meza MRN: [id number] DOB:28-Nov-1945, 78 y.o., male Today's Date: 11/06/2023   PCP: [name], DO  REFERRING PROVIDER: [name], DO   END OF SESSION:  PT End of Session - 11/06/23 1542     Visit Number 1    Number of Visits 13    Date for PT Re-Evaluation 12/22/23    Authorization Type UHC Medicare-Medicaid    Authorization Time Period auth not required; 27 visits combined    Progress Note Due on Visit 10    PT Start Time 1540    PT Stop Time 1615    PT Time Calculation (min) 35 min    Equipment Utilized During Treatment Gait belt    Activity Tolerance Patient tolerated treatment well    Behavior During Therapy WFL for tasks assessed/performed             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   Diarrhea 11/04/2020   Elevated PSA    Erectile dysfunction    Essential hypertension    Essential tremor    GERD (gastroesophageal reflux disease)    Headache(784.0)    Hiatal hernia    Hypercholesterolemia    LBBB (left bundle branch block)    Long-term use of aspirin therapy    Low back pain 10/28/2019   Major depression, chronic    Medial meniscus tear 10/11/2011   Metabolic syndrome    Morbid obesity (HCC)    Myofascial pain 12/20/2019   Nephrolithiasis    hx of   NSTEMI (non-ST elevated myocardial infarction) (HCC)    Parkinson's disease (HCC)    S/P CABG (coronary artery bypass graft)    Transient ischemic attack    hx of   Trochanteric bursitis of right hip    Past Surgical History:  Procedure Laterality Date   CARDIAC CATHETERIZATION  5/12,1/13   4 stents placed   COLON SURGERY     CORONARY ARTERY BYPASS GRAFT     KNEE ARTHROSCOPY  10/11/2011   Procedure: ARTHROSCOPY KNEE;  Surgeon: Nilda Simmer, MD;   Location: [facility];  Service: Orthopedics;  Laterality: Left;  Left Knee Arthroscopy with Medial and Lateral Partial Menisectomy, Chondroplasty   LEFT HEART CATHETERIZATION WITH CORONARY ANGIOGRAM N/A 08/25/2011   Procedure: LEFT HEART CATHETERIZATION WITH CORONARY ANGIOGRAM;  Surgeon: Kathleene Hazel, MD;  Location: [facility] CATH [facility];  Service: Cardiovascular;  Laterality: N/A;   LITHOTRIPSY     STERIOD INJECTION  10/11/2011   Procedure: STEROID INJECTION;  Surgeon: Nilda Simmer, MD;  Location: [facility];  Service: Orthopedics;  Laterality: Right;  Steroid Injection Second Toe   TRANSURETHRAL RESECTION OF PROSTATE     URETHRAL DILATION     Patient Active Problem List   Diagnosis Date Noted   Major depressive disorder, recurrent episode, moderate (HCC) 06/30/2022   Lumbar spondylosis 06/29/2022   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   Diarrhea 11/04/2020   Rectal bleeding 11/04/2020   Trochanteric bursitis of right hip    Transient ischemic attack    NSTEMI (non-ST elevated myocardial infarction) (HCC)    Nephrolithiasis    Metabolic syndrome    Major depression, chronic    Long-term use of aspirin therapy    Hypercholesterolemia  Hiatal hernia    GERD (gastroesophageal reflux disease)    Essential tremor    Essential hypertension    Erectile dysfunction    Elevated PSA    Colon cancer (HCC)    Chronic coronary artery disease    Arthritis    Myofascial pain 12/20/2019   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   Low back pain 10/28/2019   Radiculopathy, lumbar region 10/28/2019   Fatigue 01/15/2019   Chronic systolic (congestive) heart failure (HCC) 09/18/2018   Ischemic cardiomyopathy 09/16/2018   Aortic regurgitation 09/16/2018   Cardiomyopathy, unspecified (HCC) 06/13/2018   Abscess of right axilla 12/12/2017   BMI 33.0-33.9,adult 12/12/2017   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   Acute blood loss anemia  10/25/2017   Acute postoperative respiratory insufficiency 10/25/2017   Postoperative delirium 10/25/2017   Dyslipidemia 10/17/2017   Chest pain in adult 10/11/2017   SOB (shortness of breath) 03/31/2017   Long term current use of aspirin 03/29/2017   Parkinson's disease (HCC) 01/02/2017   Memory change 07/06/2015   Depression, recurrent (HCC) 07/06/2015   Medial meniscus tear 10/11/2011   Neuroma of foot 10/11/2011   Coronary artery disease involving native coronary artery of native heart with angina pectoris (HCC) 12/28/2010   OTHER TESTICULAR HYPOFUNCTION 05/20/2010   Mixed hyperlipidemia 05/20/2010   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/20/2010   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 05/20/2010   GERD 05/20/2010   History of cardiovascular disorder 05/20/2010   NEPHROLITHIASIS, HX OF 05/20/2010   BENIGN PROSTATIC HYPERTROPHY, HX OF, S/P TURP 05/20/2010    ONSET DATE: 10/20/2023 (MD referral)  REFERRING DIAG: G20.A1 (ICD-10-CM) - Parkinson's disease without dyskinesia or fluctuating manifestations (HCC)   THERAPY DIAG:  Unsteadiness on feet  Muscle weakness (generalized)  Other abnormalities of gait and mobility  Other symptoms and signs involving the nervous system  Rationale for Evaluation and Treatment: Rehabilitation  SUBJECTIVE:                                                                                                                                                                                             SUBJECTIVE STATEMENT: Feel weaker and my balance isn't as good.  Don't feel I'm sleeping as good.   Pt accompanied by: self  PERTINENT HISTORY: low back and hip pain  PAIN:  Are you having pain? Yes: NPRS scale: 3-10/10 Pain location: low back, R hip Pain description: tightness, achiness Aggravating factors: standing Relieving factors: sitting  PRECAUTIONS: Fall  RED FLAGS: None   WEIGHT BEARING RESTRICTIONS: No  FALLS:  Has patient fallen in last 6 months? No  LIVING ENVIRONMENT: Lives with: lives alone Lives in: House/apartment Stairs: No Has following equipment at home: Single point cane and Environmental consultant - 4 wheeled  PLOF: Independent and Leisure: Enjoys golfing,   PATIENT GOALS: Wants to build up my strength and balance-especially Single leg stance with getting dressed  OBJECTIVE:  Note: Objective measures were completed at Evaluation unless otherwise noted.  DIAGNOSTIC FINDINGS: NA for this episode  COGNITION: Overall cognitive status: Within functional limits for tasks assessed   SENSATION: Light touch: WFL  COORDINATION: Slowed alternating BLE tapping with LLE   POSTURE: rounded shoulders and LUE and LLE tremor  LOWER EXTREMITY ROM:     Active  Right Eval Left Eval  Hip flexion    Hip extension    Hip abduction    Hip adduction    Hip internal rotation    Hip external rotation    Knee flexion    Knee extension  -12  Ankle dorsiflexion 10 5  Ankle plantarflexion    Ankle inversion    Ankle eversion     (Blank rows = not tested)  LOWER EXTREMITY MMT:    MMT Right Eval Left Eval  Hip flexion 4 3+  Hip extension    Hip abduction    Hip adduction    Hip internal rotation    Hip external rotation    Knee flexion 4 3+  Knee extension 4 3+  Ankle dorsiflexion 4 3-  Ankle plantarflexion    Ankle inversion    Ankle eversion    (Blank rows = not tested)  BED MOBILITY:  Pt reports sleeping in his recliner  TRANSFERS: Assistive device utilized: None  Sit to stand: SBA Stand to sit: SBA  GAIT: Gait pattern: step through pattern, decreased arm swing- Left, decreased step length- Left, decreased trunk rotation, and poor foot clearance- Left Distance walked: 50 ft x 2 Assistive device utilized: None Level of assistance: Modified independence   FUNCTIONAL TESTS:  5 times sit to stand: 24.07 sec Timed up and go (TUG): 20.59 sec 10 meter walk test: 15.5 sec (2.12  ft/sec) 3 M walk backwards :  13.87 sec 360 turn to L: 10.78 sec 360 turn to R: 8.6 sec                                                                                                                              TREATMENT DATE: 11/06/2023    PATIENT EDUCATION: Education details: Eval results, POC Person educated: Patient Education method: Explanation Education comprehension: verbalized understanding  HOME EXERCISE PROGRAM: Access Code: [email or web address] URL: [email or web address] Date: 11/06/2023 Prepared by: [facility] - Outpatient  Rehab - [facility]  Program Notes Walk 3 minutes, 3x/day, in your house.  Focus on your posture, step length, arm swing.    Exercises - Sit to Stand  - 1 x daily - 7 x weekly - 3 sets - 5 reps  GOALS: Goals reviewed with patient? Yes  SHORT TERM  GOALS: Target date: 12/08/2023  Pt will be independent with HEP for improved balance, strength, gait. Baseline: Goal status: INITIAL  2.  Pt will improve 5x sit<>stand to less than or equal to 18 sec to demonstrate improved functional strength and transfer efficiency. Baseline: 24 sec Goal status: INITIAL  3.  Pt will improve TUG score to less than or equal to 15 sec for decreased fall risk. Baseline: 20 sec Goal status: INITIAL    LONG TERM GOALS: Target date: 12/22/2023  Pt will be independent with HEP for improved strength, balance, gait, decreased pain. Baseline:  Goal status: INITIAL  2.  Pt will improve 5x sit<>stand to less than or equal to 15 sec to demonstrate improved functional strength and transfer efficiency. Baseline: 24 sec Goal status: INITIAL  3.  MiniBESTest score to be assessed and score to improve by 5 points for decreased fall risk  Baseline:  Goal status: INITIAL  4.  52M walk backward to improve to less than or equal to 10 sec, for improved balance. Baseline: 13.87 sec Goal status: INITIAL  5.  Pt will improve gait velocity to at least 2.62 ft/sec for  improved gait efficiency and safety. Baseline: 2.12 ft/sec Goal status: INITIAL  6.  Pt will verbalize understanding of local Parkinson's disease community resources, including fitness options, to maximize gains made in the community. Baseline:  Goal status: INITIAL  ASSESSMENT:  CLINICAL IMPRESSION: Patient is a 78 y.o. male who was seen today for physical therapy evaluation and treatment for Parkinson's disease.  He was seen by this therapist, with sessions ending in August 2024, and reports that since that time he notes a decline in strength and balance.  He does not report any falls.  He presents today with decreased functional strength, decreased balance, decreased timing and coordination of gait, decreased flexibility, abnormal posture, tremors and bradykinesia.  He is at increased fall risk per TUG, FTSTS, 52M walk test backwards.  He does not currently have a fitness routine.  He will benefit from skilled PT to address the above stated deficits to decrease fall risk and improve overall functional mobility.  OBJECTIVE IMPAIRMENTS: Abnormal gait, decreased balance, decreased mobility, difficulty walking, decreased ROM, decreased strength, impaired flexibility, postural dysfunction, and pain.   ACTIVITY LIMITATIONS: bending, sitting, standing, squatting, sleeping, transfers, dressing, and locomotion level  PARTICIPATION LIMITATIONS: meal prep, cleaning, laundry, shopping, community activity, and yard work  PERSONAL FACTORS: 3+ comorbidities: see above and per MD note, difficulty with compliance of PD meds  are also affecting patient's functional outcome.   REHAB POTENTIAL: Good  CLINICAL DECISION MAKING: Evolving/moderate complexity  EVALUATION COMPLEXITY: Moderate  PLAN:  PT FREQUENCY: 2x/week  PT DURATION: 6 weeks plus eval  PLANNED INTERVENTIONS: 97110-Therapeutic exercises, 97530- Therapeutic activity, [id number]- Neuromuscular re-education, 97535- Self Care, [id number]- Manual  therapy, [phone]- Gait training, Patient/Family education, and Balance training  PLAN FOR NEXT SESSION: Review Initial HEP and progress HEP; assess MiniBESTest; work on functional strength, SLS, ?PWR! Moves   Gean Maidens., PT 11/06/2023, 8:07 PM  [facility] [address] [place], [address] [place], [place] [address] Phone # [phone] Fax # [phone]

## 2023-11-06 NOTE — Therapy (Unsigned)
 OUTPATIENT SPEECH LANGUAGE PATHOLOGY PARKINSON'S EVALUATION   Patient Name: Shawn [facility] MRN: [id number] DOB:1945-08-15, 78 y.o., male Today's Date: 11/07/2023  PCP: Shawn Chafe, DO REFERRING PROVIDER: Kerin Salen, DO  END OF SESSION:  End of Session - 11/06/23 1710     Visit Number 1    Number of Visits 13   pt limited with visit count   Date for SLP Re-Evaluation 01/12/24   due to starting ST week of 11/30/23   SLP Start Time 1618    SLP Stop Time  1700    SLP Time Calculation (min) 42 min    Activity Tolerance Patient tolerated treatment well             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   Diarrhea 11/04/2020   Elevated PSA    Erectile dysfunction    Essential hypertension    Essential tremor    GERD (gastroesophageal reflux disease)    Headache(784.0)    Hiatal hernia    Hypercholesterolemia    LBBB (left bundle branch block)    Long-term use of aspirin therapy    Low back pain 10/28/2019   Major depression, chronic    Medial meniscus tear 10/11/2011   Metabolic syndrome    Morbid obesity (HCC)    Myofascial pain 12/20/2019   Nephrolithiasis    hx of   NSTEMI (non-ST elevated myocardial infarction) (HCC)    Parkinson's disease (HCC)    S/P CABG (coronary artery bypass graft)    Transient ischemic attack    hx of   Trochanteric bursitis of right hip    Past Surgical History:  Procedure Laterality Date   CARDIAC CATHETERIZATION  5/12,1/13   4 stents placed   COLON SURGERY     CORONARY ARTERY BYPASS GRAFT     KNEE ARTHROSCOPY  10/11/2011   Procedure: ARTHROSCOPY KNEE;  Surgeon: Shawn Simmer, MD;  [facility];  Service: Orthopedics;  Laterality: Left;  Left Knee Arthroscopy with Medial and Lateral Partial Menisectomy, Chondroplasty   LEFT HEART  CATHETERIZATION WITH CORONARY ANGIOGRAM N/A 08/25/2011   Procedure: LEFT HEART CATHETERIZATION WITH CORONARY ANGIOGRAM;  Surgeon: Shawn Hazel, MD;  Location: [facility] CATH [facility];  Service: Cardiovascular;  Laterality: N/A;   LITHOTRIPSY     STERIOD INJECTION  10/11/2011   Procedure: STEROID INJECTION;  Surgeon: Shawn Simmer, MD;  Location: [facility];  Service: Orthopedics;  Laterality: Right;  Steroid Injection Second Toe   TRANSURETHRAL RESECTION OF PROSTATE     URETHRAL DILATION     Patient Active Problem List   Diagnosis Date Noted   Major depressive disorder, recurrent episode, moderate (HCC) 06/30/2022   Lumbar spondylosis 06/29/2022   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   Diarrhea 11/04/2020   Rectal bleeding 11/04/2020   Trochanteric bursitis of right hip    Transient ischemic attack    NSTEMI (non-ST elevated myocardial infarction) (HCC)    Nephrolithiasis    Metabolic syndrome    Major depression, chronic    Long-term use of aspirin therapy    Hypercholesterolemia    Hiatal hernia    GERD (gastroesophageal reflux disease)    Essential tremor    Essential hypertension    Erectile dysfunction    Elevated PSA    Colon cancer (HCC)  Chronic coronary artery disease    Arthritis    Myofascial pain 12/20/2019   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   Low back pain 10/28/2019   Radiculopathy, lumbar region 10/28/2019   Fatigue 01/15/2019   Chronic systolic (congestive) heart failure (HCC) 09/18/2018   Ischemic cardiomyopathy 09/16/2018   Aortic regurgitation 09/16/2018   Cardiomyopathy, unspecified (HCC) 06/13/2018   Abscess of right axilla 12/12/2017   BMI 33.0-33.9,adult 12/12/2017   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   Acute blood loss anemia 10/25/2017   Acute postoperative respiratory insufficiency 10/25/2017   Postoperative delirium 10/25/2017   Dyslipidemia 10/17/2017   Chest pain in adult 10/11/2017   SOB  (shortness of breath) 03/31/2017   Long term current use of aspirin 03/29/2017   Parkinson's disease (HCC) 01/02/2017   Memory change 07/06/2015   Depression, recurrent (HCC) 07/06/2015   Medial meniscus tear 10/11/2011   Neuroma of foot 10/11/2011   Coronary artery disease involving native coronary artery of native heart with angina pectoris (HCC) 12/28/2010   OTHER TESTICULAR HYPOFUNCTION 05/20/2010   Mixed hyperlipidemia 05/20/2010   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/20/2010   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 05/20/2010   GERD 05/20/2010   History of cardiovascular disorder 05/20/2010   NEPHROLITHIASIS, HX OF 05/20/2010   BENIGN PROSTATIC HYPERTROPHY, HX OF, S/P TURP 05/20/2010    ONSET DATE: script dated 10/20/23  REFERRING DIAG:  G20.A1 (ICD-10-CM) - Parkinson's disease without dyskinesia or fluctuating manifestations (HCC)    THERAPY DIAG:  Dysarthria and anarthria  Rationale for Evaluation and Treatment: Rehabilitation  SUBJECTIVE:   SUBJECTIVE STATEMENT: "It would be good if people said they could understand me."  "She ([name]) gets upset at me all the time for not taking my pills."  Pt accompanied by: self  PERTINENT HISTORY: See above.  PAIN:  Are you having pain? No  FALLS: Has patient fallen in last 6 months?  No, Comment: "But I've done the two step a couple times."  LIVING ENVIRONMENT: Lives with: lives alone Lives in: House/apartment  PLOF:  Level of assistance: Independent with ADLs, Independent with IADLs Employment: Retired  PATIENT GOALS: "It would be good if people said they could hear me."  OBJECTIVE:  Note: Objective measures were completed at Evaluation unless otherwise noted.  DIAGNOSTIC FINDINGS:  Neurology ([name]) - 10/20/23 [facility] was seen today in follow up for Parkinsons disease.  My previous records were reviewed prior to todays visit as well as outside records available to me.  Patient is supposed  to be on Rytary 245 mg 3 times per day.  It was last filled in September, 2024 per pharmacy records.  He reports he takes it faithfully but I last filled the med 1 year ago for a 6 month supply.  He c/o insomnia.  Has poor sleep hygiene.  Sees counseling.  He is pleased with this.  Having trouble with speech/soft voice.   1.  Parkinsons Disease             -Clinically, he is always worse when he is off of levodopa.  Compliance continues to be an issue.  He has not filled his Rytary since 04/21/23 (6 months ago).  Its generally taking him 6 months to go through 3 months worth of medication.  He and I discussed the importance of compliance and taking it regularly, which he states that he is.  [name] having more trouble at night so would be happy to increase it to  qid dosing, last at bedtime but he would have to take it.  I told him he could take Rytary 245 mg 4 times per day, with the last at bedtime, but again encouraged him to take it each and every time.             -Refer to speech and physical therapy  COGNITION: Overall cognitive status: Within functional limits for tasks assessed  MOTOR SPEECH: Overall motor speech: impaired Level of impairment: Sentence and Conversation Respiration: thoracic breathing and diaphragmatic/abdominal breathing Phonation: hoarse and low vocal intensity Resonance: WFL Articulation: Appears intact Intelligibility: Intelligible Motor planning: Appears intact Interfering components: hearing loss Effective technique: increased vocal intensity  ORAL MOTOR EXAMINATION: Overall status: Impaired: Labial: Bilateral (ROM and Coordination) Lingual: Bilateral (ROM and Coordination) Comments: better ROM with SLP demo   OBJECTIVE VOICE ASSESSMENT: Sustained "ah" maximum phonation time: 10.75 seconds Sustained "ah" loudness average:  dB Oral reading (passage) loudness average: 68 dB Oral reading loudness range: 60-71 dB Conversational loudness average: 67  dB Conversational loudness range: 60-75 dB Voice quality: hoarse (min and intermittent mod) Stimulability trials: Given SLP modeling and occasional mod cues, loudness average increased to 71dB (range of 65 to 75) at sentence level.  Comments: With diagnostic therapy today pt proved he would be a good candidate for ST if he is willing to practice regularly (at least 5 days/week) at home.  Pt does not report difficulty with swallowing which does not warrant further evaluation. However at times pt awareness is not adequate to ID dysphagia. SLP to cont to monitor.  PATIENT REPORTED OUTCOME MEASURES (PROM): Communication Effectiveness Survey: provided first 1-2 sessions                                                                                                                            TREATMENT DATE:   11/06/23: SLP reviewed results of eval, educated re: importance of completing homework, why pts state they feel they are shouting when they are WNL volume, diagnostic therapy (as above) pt incr'd sentence level responses to WNL loudness with occasional mod cues. SLP shaped pt's loud "ah" to perform BID x10 reps at home until next session.   PATIENT EDUCATION: Education details: see "treatment date" Person educated: Patient Education method: Explanation, Demonstration, and Verbal cues Education comprehension: verbalized understanding, returned demonstration, verbal cues required, and needs further education  HOME EXERCISE PROGRAM: 10 loud "ah" BID   GOALS: Goals reviewed with patient? Yes generally  SHORT TERM GOALS: Target date: 12/29/23 (pt's first ST visit is 11/30/23)  pt will produce loud /a/ with at least average 86dB average over three sessions Baseline: Goal status: INITIAL  2.  Pt will produce 16/20 sentence responses with WNL volume over two sessions Baseline:  Goal status: INITIAL  3.  Pt will produce 3 minutes simple conversation with WNL volume over three  sessions Baseline:  Goal status: INITIAL  4.  Pt will provide SLP 3 s/sx aspiration PNA with  modified indpendence Baseline:  Goal status: INITIAL   LONG TERM GOALS: Target date: 01/12/24  Pt will improve PROM compared to original score Baseline:  Goal status: INITIAL  2.  Pt will produce 5 minutes simple conversation with WNL volume over three sessions Baseline:  Goal status: INITIAL  3.  When pt is asked to repeat due to lower than WNL volume he will improve volume to WNL 95% of the time, in three sessions Baseline:  Goal status: INITIAL   ASSESSMENT:  CLINICAL IMPRESSION: Patient is a 78 y.o. M who was seen today for assessment of speech volume in light of PD. Pt was successful today in increasing his volume to WNL with occasional mod cues for sentence responses. He stated he would like to be able to have people understand him more frequently than currently.  OBJECTIVE IMPAIRMENTS: Objective impairments include dysarthria. These impairments are limiting patient from managing medications, managing appointments, household responsibilities, ADLs/IADLs, and effectively communicating at home and in community.Factors affecting potential to achieve goals and functional outcome are cooperation/participation level and family/community support.. Patient will benefit from skilled SLP services to address above impairments and improve overall function.  REHAB POTENTIAL: Fair based on aforementioned factors  PLAN:  SLP FREQUENCY: 1-2x/week  SLP DURATION:  12 sessions  PLANNED INTERVENTIONS: Environmental controls, Internal/external aids, Functional tasks, Multimodal communication approach, SLP instruction and feedback, Compensatory strategies, Patient/family education, and [address] Treatment of speech (30 or 45 min)     Willadean Guyton, CCC-SLP 11/07/2023, 8:40 AM

## 2023-11-06 NOTE — Telephone Encounter (Signed)
 Pharmacy Patient Advocate Encounter   Received notification from Pt Calls Messages that prior authorization for Rytary 61.25-245MG  er capsules is required/requested.   Insurance verification completed.   The patient is insured through [facility] .   Per test claim: PA required and submitted KEY/EOC/Request #: [id number] APPROVED from 10/31/2023 to 07/31/2024. Ran test claim, Copay is $0.00. This test claim was processed through [facility]- copay amounts may vary at other pharmacies due to pharmacy/plan contracts, or as the patient moves through the different stages of their insurance plan.

## 2023-11-08 ENCOUNTER — Ambulatory Visit: Payer: 59 | Admitting: Psychology

## 2023-11-08 DIAGNOSIS — F331 Major depressive disorder, recurrent, moderate: Secondary | ICD-10-CM

## 2023-11-08 DIAGNOSIS — F419 Anxiety disorder, unspecified: Secondary | ICD-10-CM | POA: Diagnosis not present

## 2023-11-08 DIAGNOSIS — F329 Major depressive disorder, single episode, unspecified: Secondary | ICD-10-CM | POA: Diagnosis not present
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
## 2023-11-13 ENCOUNTER — Ambulatory Visit

## 2023-11-16 NOTE — Therapy (Signed)
 OUTPATIENT PHYSICAL THERAPY NEURO TREATMENT   Patient Name: Shawn Meza MRN: [id number] DOB:03-27-1946, 78 y.o., male Today's Date: 11/20/2023   PCP: Jobe Mulder, DO  REFERRING PROVIDER: Shirline Dover, DO   END OF SESSION:  PT End of Session - 11/20/23 1651     Visit Number 2    Number of Visits 13    Date for PT Re-Evaluation 12/22/23    Authorization Type [employer] Medicare-Medicaid    Authorization Time Period auth not required; 27 visits combined    Progress Note Due on Visit 10    PT Start Time 1617    PT Stop Time 1658    PT Time Calculation (min) 41 min    Activity Tolerance Patient tolerated treatment well;Patient limited by pain    Behavior During Therapy [facility] for tasks assessed/performed              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   Diarrhea 11/04/2020   Elevated PSA    Erectile dysfunction    Essential hypertension    Essential tremor    GERD (gastroesophageal reflux disease)    Headache(784.0)    Hiatal hernia    Hypercholesterolemia    LBBB (left bundle branch block)    Long-term use of aspirin  therapy    Low back pain 10/28/2019   Major depression, chronic    Medial meniscus tear 10/11/2011   Metabolic syndrome    Morbid obesity (HCC)    Myofascial pain 12/20/2019   Nephrolithiasis    hx of   NSTEMI (non-ST elevated myocardial infarction) (HCC)    Parkinson's disease (HCC)    S/P CABG (coronary artery bypass graft)    Transient ischemic attack    hx of   Trochanteric bursitis of right hip    Past Surgical History:  Procedure Laterality Date   CARDIAC CATHETERIZATION  5/12,1/13   4 stents placed   COLON SURGERY     CORONARY ARTERY BYPASS GRAFT     KNEE ARTHROSCOPY  10/11/2011   Procedure: ARTHROSCOPY KNEE;  Surgeon: Genevie Kerns, MD;  Location: [facility];  Service: Orthopedics;  Laterality: Left;  Left Knee Arthroscopy with Medial and Lateral Partial Menisectomy, Chondroplasty   LEFT HEART CATHETERIZATION WITH CORONARY ANGIOGRAM N/A 08/25/2011   Procedure: LEFT HEART CATHETERIZATION WITH CORONARY ANGIOGRAM;  Surgeon: Odie Benne, MD;  Location: [facility];  Service: Cardiovascular;  Laterality: N/A;   LITHOTRIPSY     STERIOD INJECTION  10/11/2011   Procedure: STEROID INJECTION;  Surgeon: Genevie Kerns, MD;  Location: [facility];  Service: Orthopedics;  Laterality: Right;  Steroid Injection Second Toe   TRANSURETHRAL RESECTION OF PROSTATE     URETHRAL DILATION     Patient Active Problem List   Diagnosis Date Noted   Major depressive disorder, recurrent episode, moderate (HCC) 06/30/2022   Lumbar spondylosis 06/29/2022   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   Diarrhea 11/04/2020   Rectal bleeding 11/04/2020   Trochanteric bursitis of right hip    Transient ischemic attack    NSTEMI (non-ST elevated myocardial infarction) (HCC)    Nephrolithiasis    Metabolic syndrome    Major depression, chronic    Long-term use of aspirin  therapy    Hypercholesterolemia    Hiatal hernia  GERD (gastroesophageal reflux disease)    Essential tremor    Essential hypertension    Erectile dysfunction    Elevated PSA    Colon cancer (HCC)    Chronic coronary artery disease    Arthritis    Myofascial pain 12/20/2019   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   Low back pain 10/28/2019   Radiculopathy, lumbar region 10/28/2019   Fatigue 01/15/2019   Chronic systolic (congestive) heart failure (HCC) 09/18/2018   Ischemic cardiomyopathy 09/16/2018   Aortic regurgitation 09/16/2018   Cardiomyopathy, unspecified (HCC) 06/13/2018   Abscess of right axilla 12/12/2017   BMI 33.0-33.9,adult 12/12/2017   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   Acute blood loss anemia 10/25/2017   Acute  postoperative respiratory insufficiency 10/25/2017   Postoperative delirium 10/25/2017   Dyslipidemia 10/17/2017   Chest pain in adult 10/11/2017   SOB (shortness of breath) 03/31/2017   Long term current use of aspirin  03/29/2017   Parkinson's disease (HCC) 01/02/2017   Memory change 07/06/2015   Depression, recurrent (HCC) 07/06/2015   Medial meniscus tear 10/11/2011   Neuroma of foot 10/11/2011   Coronary artery disease involving native coronary artery of native heart with angina pectoris (HCC) 12/28/2010   OTHER TESTICULAR HYPOFUNCTION 05/20/2010   Mixed hyperlipidemia 05/20/2010   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/20/2010   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 05/20/2010   GERD 05/20/2010   History of cardiovascular disorder 05/20/2010   NEPHROLITHIASIS, HX OF 05/20/2010   BENIGN PROSTATIC HYPERTROPHY, HX OF, S/P TURP 05/20/2010    ONSET DATE: 10/20/2023 (MD referral)  REFERRING DIAG: G20.A1 (ICD-10-CM) - Parkinson's disease without dyskinesia or fluctuating manifestations (HCC)   THERAPY DIAG:  Unsteadiness on feet  Muscle weakness (generalized)  Other abnormalities of gait and mobility  Other symptoms and signs involving the nervous system  Rationale for Evaluation and Treatment: Rehabilitation  SUBJECTIVE:                                                                                                                                                                                             SUBJECTIVE STATEMENT: My back has been bothering me more than anything. Been dealing with a flare up for the past 2 weeks. Reports that the meds he takes for pain knock him out. Has been sleeping in recliner for the past 2 weeks. Sometimes uses heat and a vibrator on his muscles for some relief. Reports that he has trouble taking care of his household but has 2 dogs and worries about what he would do with them if he moved.   Pt accompanied by:  self  PERTINENT HISTORY: low back and hip pain  PAIN:  Are you having pain? Yes: NPRS scale: 7/10 Pain location: low back, R buttock Pain description: tightness, achiness Aggravating factors: standing Relieving factors: sitting  PRECAUTIONS: Fall  RED FLAGS: None   WEIGHT BEARING RESTRICTIONS: No  FALLS: Has patient fallen in last 6 months? No  LIVING ENVIRONMENT: Lives with: lives alone Lives in: House/apartment Stairs: No Has following equipment at home: Single point cane and Environmental consultant - 4 wheeled  PLOF: Independent and Leisure: Enjoys golfing,   PATIENT GOALS: Wants to build up my strength and balance-especially Single leg stance with getting dressed  OBJECTIVE:     TODAY'S TREATMENT: 11/20/23 Activity Comments  Sitting prayer stretch with pball fwd, R, L Tolerated well but some "pull" to the L side and some tingling in the L thigh. Advised not to push into pain   Sitting pelvic tilts  Tolerated well   Sitting fig 4 with strap assist and PT assist  Tolerated well. LE resting on PT's knee- advised to try at home with ottoman   Sitting PWR up 10x  Slow; c/o L LBP but able to tolerate           HOME EXERCISE PROGRAM Last updated: 11/20/23 Access Code: [email or web address] URL: [email or web address] Date: 11/20/2023 Prepared by: [facility] - Outpatient  Rehab - [facility]  Exercises - Seated Pelvic Tilt  - 1 x daily - 5 x weekly - 2 sets - 10 reps - Seated Figure 4 Piriformis Stretch  - 1 x daily - 5 x weekly - 2 sets - 30 sec hold   PATIENT EDUCATION: Education details: edu on rigidity contributing to LB stiffness and discomfort, possible need for stretching program to manage pain. Troubleshooting through different positions with HEP for max success and safety, HEP update  Person educated: Patient Education method: Explanation, Demonstration, Tactile cues, Verbal cues, and Handouts Education comprehension: verbalized understanding and returned  demonstration      Note: Objective measures were completed at Evaluation unless otherwise noted.  DIAGNOSTIC FINDINGS: NA for this episode  COGNITION: Overall cognitive status: Within functional limits for tasks assessed   SENSATION: Light touch: WFL  COORDINATION: Slowed alternating BLE tapping with LLE   POSTURE: rounded shoulders and LUE and LLE tremor  LOWER EXTREMITY ROM:     Active  Right Eval Left Eval  Hip flexion    Hip extension    Hip abduction    Hip adduction    Hip internal rotation    Hip external rotation    Knee flexion    Knee extension  -12  Ankle dorsiflexion 10 5  Ankle plantarflexion    Ankle inversion    Ankle eversion     (Blank rows = not tested)  LOWER EXTREMITY MMT:    MMT Right Eval Left Eval  Hip flexion 4 3+  Hip extension    Hip abduction    Hip adduction    Hip internal rotation    Hip external rotation    Knee flexion 4 3+  Knee extension 4 3+  Ankle dorsiflexion 4 3-  Ankle plantarflexion    Ankle inversion    Ankle eversion    (Blank rows = not tested)  BED MOBILITY:  Pt reports sleeping in his recliner  TRANSFERS: Assistive device utilized: None  Sit to stand: SBA Stand to sit: SBA  GAIT: Gait pattern: step through pattern, decreased arm swing- Left, decreased step length- Left, decreased trunk rotation, and poor  foot clearance- Left Distance walked: 50 ft x 2 Assistive device utilized: None Level of assistance: Modified independence   FUNCTIONAL TESTS:  5 times sit to stand: 24.07 sec Timed up and go (TUG): 20.59 sec 10 meter walk test: 15.5 sec (2.12 ft/sec) 3 M walk backwards :  13.87 sec 360 turn to L: 10.78 sec 360 turn to R: 8.6 sec                                                                                                                              TREATMENT DATE: 11/06/2023    PATIENT EDUCATION: Education details: Eval results, POC Person educated: Patient Education method:  Explanation Education comprehension: verbalized understanding  HOME EXERCISE PROGRAM: Access Code: [email or web address] URL: [email or web address] Date: 11/06/2023 Prepared by: [facility] - Outpatient  Rehab - [facility]  Program Notes Walk 3 minutes, 3x/day, in your house.  Focus on your posture, step length, arm swing.    Exercises - Sit to Stand  - 1 x daily - 7 x weekly - 3 sets - 5 reps  GOALS: Goals reviewed with patient? Yes  SHORT TERM GOALS: Target date: 12/08/2023  Pt will be independent with HEP for improved balance, strength, gait. Baseline: Goal status: IN PROGRESS  2.  Pt will improve 5x sit<>stand to less than or equal to 18 sec to demonstrate improved functional strength and transfer efficiency. Baseline: 24 sec Goal status: IN PROGRESS  3.  Pt will improve TUG score to less than or equal to 15 sec for decreased fall risk. Baseline: 20 sec Goal status: IN PROGRESS    LONG TERM GOALS: Target date: 12/22/2023  Pt will be independent with HEP for improved strength, balance, gait, decreased pain. Baseline:  Goal status: IN PROGRESS  2.  Pt will improve 5x sit<>stand to less than or equal to 15 sec to demonstrate improved functional strength and transfer efficiency. Baseline: 24 sec Goal status: IN PROGRESS  3.  MiniBESTest score to be assessed and score to improve by 5 points for decreased fall risk  Baseline:  Goal status: IN PROGRESS  4.  54M walk backward to improve to less than or equal to 10 sec, for improved balance. Baseline: 13.87 sec Goal status: IN PROGRESS  5.  Pt will improve gait velocity to at least 2.62 ft/sec for improved gait efficiency and safety. Baseline: 2.12 ft/sec Goal status: IN PROGRESS  6.  Pt will verbalize understanding of local Parkinson's disease community resources, including fitness options, to maximize gains made in the community. Baseline:  Goal status: IN PROGRESS  ASSESSMENT:  CLINICAL  IMPRESSION: Patient arrived to session with report of LBP and R buttock flare for the past 2 weeks. Reports difficulty taking care of his household d/t combination of pain and PD symptoms. Patient performed very gentle stretching d/t with modifications for max success and comfort as LBP was limiting factor in today's session today. Provided pt edu on modifications to daily  activities and importance of gentle stretching routine to perform daily for max benefit. Patient reported "I feel better" upon leaving.   OBJECTIVE IMPAIRMENTS: Abnormal gait, decreased balance, decreased mobility, difficulty walking, decreased ROM, decreased strength, impaired flexibility, postural dysfunction, and pain.   ACTIVITY LIMITATIONS: bending, sitting, standing, squatting, sleeping, transfers, dressing, and locomotion level  PARTICIPATION LIMITATIONS: meal prep, cleaning, laundry, shopping, community activity, and yard work  PERSONAL FACTORS: 3+ comorbidities: see above and per MD note, difficulty with compliance of PD meds  are also affecting patient's functional outcome.   REHAB POTENTIAL: Good  CLINICAL DECISION MAKING: Evolving/moderate complexity  EVALUATION COMPLEXITY: Moderate  PLAN:  PT FREQUENCY: 2x/week  PT DURATION: 6 weeks plus eval  PLANNED INTERVENTIONS: 97110-Therapeutic exercises, 97530- Therapeutic activity, [id number]- Neuromuscular re-education, 97535- Self Care, [id number]- Manual therapy, [phone]- Gait training, Patient/Family education, and Balance training  PLAN FOR NEXT SESSION: Review Initial HEP and progress HEP; assess MiniBESTest; work on functional strength, SLS, ?PWR! Moves   [place], Mettler, DPT 11/20/23 5:02 PM  [facility] at [facility] [address], [address] [place], [place] [address] Phone # [phone] Fax # [phone]

## 2023-11-20 ENCOUNTER — Ambulatory Visit: Admitting: Physical Therapy

## 2023-11-20 ENCOUNTER — Encounter: Payer: Self-pay | Admitting: Physical Therapy

## 2023-11-20 DIAGNOSIS — R2681 Unsteadiness on feet: Secondary | ICD-10-CM

## 2023-11-20 DIAGNOSIS — R471 Dysarthria and anarthria: Secondary | ICD-10-CM | POA: Diagnosis not present

## 2023-11-20 DIAGNOSIS — M6281 Muscle weakness (generalized): Secondary | ICD-10-CM | POA: Diagnosis not present

## 2023-11-20 DIAGNOSIS — R2689 Other abnormalities of gait and mobility: Secondary | ICD-10-CM

## 2023-11-20 DIAGNOSIS — G20A1 Parkinson's disease without dyskinesia, without mention of fluctuations: Secondary | ICD-10-CM | POA: Diagnosis not present

## 2023-11-20 DIAGNOSIS — R29818 Other symptoms and signs involving the nervous system: Secondary | ICD-10-CM

## 2023-11-22 ENCOUNTER — Ambulatory Visit (INDEPENDENT_AMBULATORY_CARE_PROVIDER_SITE_OTHER): Payer: 59 | Admitting: Psychology

## 2023-11-22 DIAGNOSIS — F419 Anxiety disorder, unspecified: Secondary | ICD-10-CM | POA: Diagnosis not present

## 2023-11-22 DIAGNOSIS — F331 Major depressive disorder, recurrent, moderate: Secondary | ICD-10-CM | POA: Diagnosis not present
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
## 2023-11-23 ENCOUNTER — Ambulatory Visit

## 2023-11-23 DIAGNOSIS — R2689 Other abnormalities of gait and mobility: Secondary | ICD-10-CM

## 2023-11-23 DIAGNOSIS — R2681 Unsteadiness on feet: Secondary | ICD-10-CM | POA: Diagnosis not present

## 2023-11-23 DIAGNOSIS — R29818 Other symptoms and signs involving the nervous system: Secondary | ICD-10-CM

## 2023-11-23 DIAGNOSIS — M6281 Muscle weakness (generalized): Secondary | ICD-10-CM

## 2023-11-23 DIAGNOSIS — G20A1 Parkinson's disease without dyskinesia, without mention of fluctuations: Secondary | ICD-10-CM | POA: Diagnosis not present

## 2023-11-23 DIAGNOSIS — R471 Dysarthria and anarthria: Secondary | ICD-10-CM | POA: Diagnosis not present

## 2023-11-23 NOTE — Therapy (Signed)
 OUTPATIENT PHYSICAL THERAPY NEURO TREATMENT   Patient Name: Shawn Meza MRN: [id number] DOB:1945/10/30, 78 y.o., male Today's Date: 11/23/2023   PCP: Jobe Mulder, DO  REFERRING PROVIDER: Shirline Dover, DO   END OF SESSION:  PT End of Session - 11/23/23 1618     Visit Number 3    Number of Visits 13    Date for PT Re-Evaluation 12/22/23    Authorization Type [employer] Medicare-Medicaid    Authorization Time Period auth not required; 27 visits combined    Progress Note Due on Visit 10    PT Start Time 1615    PT Stop Time 1700    PT Time Calculation (min) 45 min    Activity Tolerance Patient tolerated treatment well;Patient limited by pain    Behavior During Therapy [facility] for tasks assessed/performed              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   Diarrhea 11/04/2020   Elevated PSA    Erectile dysfunction    Essential hypertension    Essential tremor    GERD (gastroesophageal reflux disease)    Headache(784.0)    Hiatal hernia    Hypercholesterolemia    LBBB (left bundle branch block)    Long-term use of aspirin  therapy    Low back pain 10/28/2019   Major depression, chronic    Medial meniscus tear 10/11/2011   Metabolic syndrome    Morbid obesity (HCC)    Myofascial pain 12/20/2019   Nephrolithiasis    hx of   NSTEMI (non-ST elevated myocardial infarction) (HCC)    Parkinson's disease (HCC)    S/P CABG (coronary artery bypass graft)    Transient ischemic attack    hx of   Trochanteric bursitis of right hip    Past Surgical History:  Procedure Laterality Date   CARDIAC CATHETERIZATION  5/12,1/13   4 stents placed   COLON SURGERY     CORONARY ARTERY BYPASS GRAFT     KNEE ARTHROSCOPY  10/11/2011   Procedure: ARTHROSCOPY KNEE;  Surgeon: Genevie Kerns, MD;  Location: [facility];  Service: Orthopedics;  Laterality: Left;  Left Knee Arthroscopy with Medial and Lateral Partial Menisectomy, Chondroplasty   LEFT HEART CATHETERIZATION WITH CORONARY ANGIOGRAM N/A 08/25/2011   Procedure: LEFT HEART CATHETERIZATION WITH CORONARY ANGIOGRAM;  Surgeon: Odie Benne, MD;  Location: [facility] C[facility];  Service: Cardiovascular;  Laterality: N/A;   LITHOTRIPSY     STERIOD INJECTION  10/11/2011   Procedure: STEROID INJECTION;  Surgeon: Genevie Kerns, MD;  Location: [facility];  Service: Orthopedics;  Laterality: Right;  Steroid Injection Second Toe   TRANSURETHRAL RESECTION OF PROSTATE     URETHRAL DILATION     Patient Active Problem List   Diagnosis Date Noted   Major depressive disorder, recurrent episode, moderate (HCC) 06/30/2022   Lumbar spondylosis 06/29/2022   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   Diarrhea 11/04/2020   Rectal bleeding 11/04/2020   Trochanteric bursitis of right hip    Transient ischemic attack    NSTEMI (non-ST elevated myocardial infarction) (HCC)    Nephrolithiasis    Metabolic syndrome    Major depression, chronic    Long-term use of aspirin  therapy    Hypercholesterolemia    Hiatal hernia  GERD (gastroesophageal reflux disease)    Essential tremor    Essential hypertension    Erectile dysfunction    Elevated PSA    Colon cancer (HCC)    Chronic coronary artery disease    Arthritis    Myofascial pain 12/20/2019   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   Low back pain 10/28/2019   Radiculopathy, lumbar region 10/28/2019   Fatigue 01/15/2019   Chronic systolic (congestive) heart failure (HCC) 09/18/2018   Ischemic cardiomyopathy 09/16/2018   Aortic regurgitation 09/16/2018   Cardiomyopathy, unspecified (HCC) 06/13/2018   Abscess of right axilla 12/12/2017   BMI 33.0-33.9,adult 12/12/2017   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   Acute blood loss anemia 10/25/2017   Acute  postoperative respiratory insufficiency 10/25/2017   Postoperative delirium 10/25/2017   Dyslipidemia 10/17/2017   Chest pain in adult 10/11/2017   SOB (shortness of breath) 03/31/2017   Long term current use of aspirin  03/29/2017   Parkinson's disease (HCC) 01/02/2017   Memory change 07/06/2015   Depression, recurrent (HCC) 07/06/2015   Medial meniscus tear 10/11/2011   Neuroma of foot 10/11/2011   Coronary artery disease involving native coronary artery of native heart with angina pectoris (HCC) 12/28/2010   OTHER TESTICULAR HYPOFUNCTION 05/20/2010   Mixed hyperlipidemia 05/20/2010   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/20/2010   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 05/20/2010   GERD 05/20/2010   History of cardiovascular disorder 05/20/2010   NEPHROLITHIASIS, HX OF 05/20/2010   BENIGN PROSTATIC HYPERTROPHY, HX OF, S/P TURP 05/20/2010    ONSET DATE: 10/20/2023 (MD referral)  REFERRING DIAG: G20.A1 (ICD-10-CM) - Parkinson's disease without dyskinesia or fluctuating manifestations (HCC)   THERAPY DIAG:  Unsteadiness on feet  Muscle weakness (generalized)  Other abnormalities of gait and mobility  Other symptoms and signs involving the nervous system  Parkinson's disease, unspecified whether dyskinesia present, unspecified whether manifestations fluctuate (HCC)  Rationale for Evaluation and Treatment: Rehabilitation  SUBJECTIVE:                                                                                                                                                                                             SUBJECTIVE STATEMENT: Back is still limiting a lot of activity    Pt accompanied by: self  PERTINENT HISTORY: low back and hip pain  PAIN:  Are you having pain? Yes: NPRS scale: 6/10 Pain location: low back, R buttock Pain description: tightness, achiness Aggravating factors: standing Relieving factors: sitting  PRECAUTIONS:  Fall  RED FLAGS: None   WEIGHT BEARING RESTRICTIONS: No  FALLS: Has patient fallen in last 6 months?  No  LIVING ENVIRONMENT: Lives with: lives alone Lives in: House/apartment Stairs: No Has following equipment at home: Single point cane and Environmental consultant - 4 wheeled  PLOF: Independent and Leisure: Enjoys golfing,   PATIENT GOALS: Wants to build up my strength and balance-especially Single leg stance with getting dressed  OBJECTIVE:   TODAY'S TREATMENT: 11/23/23 Activity Comments  Biofreeze applied to lumbar   Sitting prayer stretch with pball fwd, R, L 1x10   SKTC 1x60 sec W/ assist  DKTC 1x90 sec W/ assist  LTR x 2 min   Supine fig 4 piriformis x 60 sec W/ assist  Body mechanics education Movements/positions  for trunk flexion bias e.g. place foot in sink cabinet to reduce fatigue when washing dishes  Pivot turns transferring cones 1x10 alternating left/right 1x10 with dual task         TODAY'S TREATMENT: 11/20/23 Activity Comments  Sitting prayer stretch with pball fwd, R, L Tolerated well but some "pull" to the L side and some tingling in the L thigh. Advised not to push into pain   Sitting pelvic tilts  Tolerated well   Sitting fig 4 with strap assist and PT assist  Tolerated well. LE resting on PT's knee- advised to try at home with ottoman   Sitting PWR up 10x  Slow; c/o L LBP but able to tolerate           HOME EXERCISE PROGRAM Last updated: 11/20/23 Access Code: [email or web address] URL: [email or web address] Date: 11/20/2023 Prepared by: [facility] - Outpatient  Rehab - [facility]  Exercises - Seated Pelvic Tilt  - 1 x daily - 5 x weekly - 2 sets - 10 reps - Seated Figure 4 Piriformis Stretch  - 1 x daily - 5 x weekly - 2 sets - 30 sec hold   PATIENT EDUCATION: Education details: edu on rigidity contributing to LB stiffness and discomfort, possible need for stretching program to manage pain. Troubleshooting through different positions with HEP for max  success and safety, HEP update  Person educated: Patient Education method: Explanation, Demonstration, Tactile cues, Verbal cues, and Handouts Education comprehension: verbalized understanding and returned demonstration      Note: Objective measures were completed at Evaluation unless otherwise noted.  DIAGNOSTIC FINDINGS: NA for this episode  COGNITION: Overall cognitive status: Within functional limits for tasks assessed   SENSATION: Light touch: WFL  COORDINATION: Slowed alternating BLE tapping with LLE   POSTURE: rounded shoulders and LUE and LLE tremor  LOWER EXTREMITY ROM:     Active  Right Eval Left Eval  Hip flexion    Hip extension    Hip abduction    Hip adduction    Hip internal rotation    Hip external rotation    Knee flexion    Knee extension  -12  Ankle dorsiflexion 10 5  Ankle plantarflexion    Ankle inversion    Ankle eversion     (Blank rows = not tested)  LOWER EXTREMITY MMT:    MMT Right Eval Left Eval  Hip flexion 4 3+  Hip extension    Hip abduction    Hip adduction    Hip internal rotation    Hip external rotation    Knee flexion 4 3+  Knee extension 4 3+  Ankle dorsiflexion 4 3-  Ankle plantarflexion    Ankle inversion    Ankle eversion    (Blank rows = not tested)  BED MOBILITY:  Pt reports sleeping in his recliner  TRANSFERS: Assistive  device utilized: None  Sit to stand: SBA Stand to sit: SBA  GAIT: Gait pattern: step through pattern, decreased arm swing- Left, decreased step length- Left, decreased trunk rotation, and poor foot clearance- Left Distance walked: 50 ft x 2 Assistive device utilized: None Level of assistance: Modified independence   FUNCTIONAL TESTS:  5 times sit to stand: 24.07 sec Timed up and go (TUG): 20.59 sec 10 meter walk test: 15.5 sec (2.12 ft/sec) 3 M walk backwards :  13.87 sec 360 turn to L: 10.78 sec 360 turn to R: 8.6 sec                                                                                                                               TREATMENT DATE: 11/06/2023    PATIENT EDUCATION: Education details: Eval results, POC Person educated: Patient Education method: Explanation Education comprehension: verbalized understanding  HOME EXERCISE PROGRAM: Access Code: [email or web address] URL: [email or web address] Date: 11/06/2023 Prepared by: [facility] - Outpatient  Rehab - [facility]  Program Notes Walk 3 minutes, 3x/day, in your house.  Focus on your posture, step length, arm swing.    Exercises - Sit to Stand  - 1 x daily - 7 x weekly - 3 sets - 5 reps  GOALS: Goals reviewed with patient? Yes  SHORT TERM GOALS: Target date: 12/08/2023  Pt will be independent with HEP for improved balance, strength, gait. Baseline: Goal status: IN PROGRESS  2.  Pt will improve 5x sit<>stand to less than or equal to 18 sec to demonstrate improved functional strength and transfer efficiency. Baseline: 24 sec Goal status: IN PROGRESS  3.  Pt will improve TUG score to less than or equal to 15 sec for decreased fall risk. Baseline: 20 sec Goal status: IN PROGRESS    LONG TERM GOALS: Target date: 12/22/2023  Pt will be independent with HEP for improved strength, balance, gait, decreased pain. Baseline:  Goal status: IN PROGRESS  2.  Pt will improve 5x sit<>stand to less than or equal to 15 sec to demonstrate improved functional strength and transfer efficiency. Baseline: 24 sec Goal status: IN PROGRESS  3.  MiniBESTest score to be assessed and score to improve by 5 points for decreased fall risk  Baseline:  Goal status: IN PROGRESS  4.  54M walk backward to improve to less than or equal to 10 sec, for improved balance. Baseline: 13.87 sec Goal status: IN PROGRESS  5.  Pt will improve gait velocity to at least 2.62 ft/sec for improved gait efficiency and safety. Baseline: 2.12 ft/sec Goal status: IN PROGRESS  6.  Pt will verbalize understanding  of local Parkinson's disease community resources, including fitness options, to maximize gains made in the community. Baseline:  Goal status: IN PROGRESS  ASSESSMENT:  CLINICAL IMPRESSION: Reports ongoing LBP with session initiaited with flexion biased ROM and stretching activities to reduce pain/guarding. Educated on body mechaincs to reinforce this premise for activities such as  washing dishes and use of 4WW for ambulation to promote trunk flexion while walking to enable longer distances/time for benefit.  Training on pivot turns for strategies to reduce freezing of gait with good maintenance of motor task with dual task demands imposed.  Pt reports back pain to 4/10 post session. Continued sessions to progress POC details to improve mobility and reduce risk for falls.   OBJECTIVE IMPAIRMENTS: Abnormal gait, decreased balance, decreased mobility, difficulty walking, decreased ROM, decreased strength, impaired flexibility, postural dysfunction, and pain.   ACTIVITY LIMITATIONS: bending, sitting, standing, squatting, sleeping, transfers, dressing, and locomotion level  PARTICIPATION LIMITATIONS: meal prep, cleaning, laundry, shopping, community activity, and yard work  PERSONAL FACTORS: 3+ comorbidities: see above and per MD note, difficulty with compliance of PD meds  are also affecting patient's functional outcome.   REHAB POTENTIAL: Good  CLINICAL DECISION MAKING: Evolving/moderate complexity  EVALUATION COMPLEXITY: Moderate  PLAN:  PT FREQUENCY: 2x/week  PT DURATION: 6 weeks plus eval  PLANNED INTERVENTIONS: 97110-Therapeutic exercises, 97530- Therapeutic activity, [id number]- Neuromuscular re-education, 97535- Self Care, [id number]- Manual therapy, [phone]- Gait training, Patient/Family education, and Balance training  PLAN FOR NEXT SESSION: Review Initial HEP and progress HEP; assess MiniBESTest; work on functional strength, SLS, ?PWR! Moves   5:09 PM, 11/23/23 M. Kelly Darice Vicario, PT,  DPT Physical Therapist- [place] Office Number: [phone]

## 2023-11-27 ENCOUNTER — Ambulatory Visit

## 2023-11-27 DIAGNOSIS — M6281 Muscle weakness (generalized): Secondary | ICD-10-CM

## 2023-11-27 DIAGNOSIS — R29818 Other symptoms and signs involving the nervous system: Secondary | ICD-10-CM | POA: Diagnosis not present

## 2023-11-27 DIAGNOSIS — R2681 Unsteadiness on feet: Secondary | ICD-10-CM | POA: Diagnosis not present

## 2023-11-27 DIAGNOSIS — R471 Dysarthria and anarthria: Secondary | ICD-10-CM | POA: Diagnosis not present

## 2023-11-27 DIAGNOSIS — R2689 Other abnormalities of gait and mobility: Secondary | ICD-10-CM | POA: Diagnosis not present

## 2023-11-27 DIAGNOSIS — G20A1 Parkinson's disease without dyskinesia, without mention of fluctuations: Secondary | ICD-10-CM | POA: Diagnosis not present

## 2023-11-27 NOTE — Therapy (Signed)
 OUTPATIENT PHYSICAL THERAPY NEURO TREATMENT   Patient Name: Shawn Meza MRN: [id number] DOB:[birth year], 78 y.o., male Today's Date: 11/27/2023   PCP: Jobe Mulder, DO  REFERRING PROVIDER: Shirline Dover, DO   END OF SESSION:  PT End of Session - 11/27/23 1620     Visit Number 4    Number of Visits 13    Date for PT Re-Evaluation 12/22/23    Authorization Type UHC Medicare-Medicaid    Authorization Time Period auth not required; 27 visits combined    Progress Note Due on Visit 10    PT Start Time 1620    PT Stop Time 1700    PT Time Calculation (min) 40 min    Activity Tolerance Patient tolerated treatment well;Patient limited by pain    Behavior During Therapy [facility] for tasks assessed/performed              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   Diarrhea 11/04/2020   Elevated PSA    Erectile dysfunction    Essential hypertension    Essential tremor    GERD (gastroesophageal reflux disease)    Headache(784.0)    Hiatal hernia    Hypercholesterolemia    LBBB (left bundle branch block)    Long-term use of aspirin  therapy    Low back pain 10/28/2019   Major depression, chronic    Medial meniscus tear 10/11/2011   Metabolic syndrome    Morbid obesity (HCC)    Myofascial pain 12/20/2019   Nephrolithiasis    hx of   NSTEMI (non-ST elevated myocardial infarction) (HCC)    Parkinson's disease (HCC)    S/P CABG (coronary artery bypass graft)    Transient ischemic attack    hx of   Trochanteric bursitis of right hip    Past Surgical History:  Procedure Laterality Date   CARDIAC CATHETERIZATION  5/12,1/13   4 stents placed   COLON SURGERY     CORONARY ARTERY BYPASS GRAFT     KNEE ARTHROSCOPY  10/11/2011   Procedure: ARTHROSCOPY KNEE;  Surgeon: Genevie Kerns, MD;  Location: [facility];  Service: Orthopedics;  Laterality: Left;  Left Knee Arthroscopy with Medial and Lateral Partial Menisectomy, Chondroplasty   LEFT HEART CATHETERIZATION WITH CORONARY ANGIOGRAM N/A 08/25/2011   Procedure: LEFT HEART CATHETERIZATION WITH CORONARY ANGIOGRAM;  Surgeon: Odie Benne, MD;  Location: [facility] CATH [facility];  Service: Cardiovascular;  Laterality: N/A;   LITHOTRIPSY     STERIOD INJECTION  10/11/2011   Procedure: STEROID INJECTION;  Surgeon: Genevie Kerns, MD;  Location: [facility];  Service: Orthopedics;  Laterality: Right;  Steroid Injection Second Toe   TRANSURETHRAL RESECTION OF PROSTATE     URETHRAL DILATION     Patient Active Problem List   Diagnosis Date Noted   Major depressive disorder, recurrent episode, moderate (HCC) 06/30/2022   Lumbar spondylosis 06/29/2022   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   Diarrhea 11/04/2020   Rectal bleeding 11/04/2020   Trochanteric bursitis of right hip    Transient ischemic attack    NSTEMI (non-ST elevated myocardial infarction) (HCC)    Nephrolithiasis    Metabolic syndrome    Major depression, chronic    Long-term use of aspirin  therapy    Hypercholesterolemia    Hiatal hernia  GERD (gastroesophageal reflux disease)    Essential tremor    Essential hypertension    Erectile dysfunction    Elevated PSA    Colon cancer (HCC)    Chronic coronary artery disease    Arthritis    Myofascial pain 12/20/2019   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   Low back pain 10/28/2019   Radiculopathy, lumbar region 10/28/2019   Fatigue 01/15/2019   Chronic systolic (congestive) heart failure (HCC) 09/18/2018   Ischemic cardiomyopathy 09/16/2018   Aortic regurgitation 09/16/2018   Cardiomyopathy, unspecified (HCC) 06/13/2018   Abscess of right axilla 12/12/2017   BMI 33.0-33.9,adult 12/12/2017   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   Acute blood loss anemia 10/25/2017   Acute  postoperative respiratory insufficiency 10/25/2017   Postoperative delirium 10/25/2017   Dyslipidemia 10/17/2017   Chest pain in adult 10/11/2017   SOB (shortness of breath) 03/31/2017   Long term current use of aspirin  03/29/2017   Parkinson's disease (HCC) 01/02/2017   Memory change 07/06/2015   Depression, recurrent (HCC) 07/06/2015   Medial meniscus tear 10/11/2011   Neuroma of foot 10/11/2011   Coronary artery disease involving native coronary artery of native heart with angina pectoris (HCC) 12/28/2010   OTHER TESTICULAR HYPOFUNCTION 05/20/2010   Mixed hyperlipidemia 05/20/2010   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/20/2010   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 05/20/2010   GERD 05/20/2010   History of cardiovascular disorder 05/20/2010   NEPHROLITHIASIS, HX OF 05/20/2010   BENIGN PROSTATIC HYPERTROPHY, HX OF, S/P TURP 05/20/2010    ONSET DATE: 10/20/2023 (MD referral)  REFERRING DIAG: G20.A1 (ICD-10-CM) - Parkinson's disease without dyskinesia or fluctuating manifestations (HCC)   THERAPY DIAG:  Unsteadiness on feet  Muscle weakness (generalized)  Other abnormalities of gait and mobility  Other symptoms and signs involving the nervous system  Parkinson's disease, unspecified whether dyskinesia present, unspecified whether manifestations fluctuate (HCC)  Rationale for Evaluation and Treatment: Rehabilitation  SUBJECTIVE:                                                                                                                                                                                             SUBJECTIVE STATEMENT: Back is still bothersome   Pt accompanied by: self  PERTINENT HISTORY: low back and hip pain  PAIN:  Are you having pain? Yes: NPRS scale: 5/10 Pain location: low back, R buttock Pain description: tightness, achiness Aggravating factors: standing Relieving factors: sitting  PRECAUTIONS: Fall  RED  FLAGS: None   WEIGHT BEARING RESTRICTIONS: No  FALLS: Has patient fallen in last 6 months? No  LIVING ENVIRONMENT: Lives  with: lives alone Lives in: House/apartment Stairs: No Has following equipment at home: Single point cane and Environmental consultant - 4 wheeled  PLOF: Independent and Leisure: Enjoys golfing,   PATIENT GOALS: Wants to build up my strength and balance-especially Single leg stance with getting dressed  OBJECTIVE:   TODAY'S TREATMENT: 11/27/23 Activity Comments  Biofreeze applied to lumbar Trigger poin in right QL   NU-step speed intervals  x 9 min 2 min warmup 30 sec speed; 60 sec recovery  Seated PWR moves Demo of video for home tutorial 1x10 w/ therapist guiding w/ increased speed                 HOME EXERCISE PROGRAM Last updated: 11/20/23 Access Code: [email or web address] URL: [email or web address] Date: 11/20/2023 Prepared by: [facility] - Outpatient  Rehab - [facility]  Exercises - Seated Pelvic Tilt  - 1 x daily - 5 x weekly - 2 sets - 10 reps - Seated Figure 4 Piriformis Stretch  - 1 x daily - 5 x weekly - 2 sets - 30 sec hold   PATIENT EDUCATION: Education details: edu on rigidity contributing to LB stiffness and discomfort, possible need for stretching program to manage pain. Troubleshooting through different positions with HEP for max success and safety, HEP update  Person educated: Patient Education method: Explanation, Demonstration, Tactile cues, Verbal cues, and Handouts Education comprehension: verbalized understanding and returned demonstration      Note: Objective measures were completed at Evaluation unless otherwise noted.  DIAGNOSTIC FINDINGS: NA for this episode  COGNITION: Overall cognitive status: Within functional limits for tasks assessed   SENSATION: Light touch: WFL  COORDINATION: Slowed alternating BLE tapping with LLE   POSTURE: rounded shoulders and LUE and LLE tremor  LOWER EXTREMITY ROM:     Active   Right Eval Left Eval  Hip flexion    Hip extension    Hip abduction    Hip adduction    Hip internal rotation    Hip external rotation    Knee flexion    Knee extension  -12  Ankle dorsiflexion 10 5  Ankle plantarflexion    Ankle inversion    Ankle eversion     (Blank rows = not tested)  LOWER EXTREMITY MMT:    MMT Right Eval Left Eval  Hip flexion 4 3+  Hip extension    Hip abduction    Hip adduction    Hip internal rotation    Hip external rotation    Knee flexion 4 3+  Knee extension 4 3+  Ankle dorsiflexion 4 3-  Ankle plantarflexion    Ankle inversion    Ankle eversion    (Blank rows = not tested)  BED MOBILITY:  Pt reports sleeping in his recliner  TRANSFERS: Assistive device utilized: None  Sit to stand: SBA Stand to sit: SBA  GAIT: Gait pattern: step through pattern, decreased arm swing- Left, decreased step length- Left, decreased trunk rotation, and poor foot clearance- Left Distance walked: 50 ft x 2 Assistive device utilized: None Level of assistance: Modified independence   FUNCTIONAL TESTS:  5 times sit to stand: 24.07 sec Timed up and go (TUG): 20.59 sec 10 meter walk test: 15.5 sec (2.12 ft/sec) 3 M walk backwards :  13.87 sec 360 turn to L: 10.78 sec 360 turn to R: 8.6 sec  TREATMENT DATE: 11/06/2023    PATIENT EDUCATION: Education details: Eval results, POC Person educated: Patient Education method: Explanation Education comprehension: verbalized understanding  HOME EXERCISE PROGRAM: Access Code: [email or web address] URL: [email or web address] Date: 11/06/2023 Prepared by: [facility] - Outpatient  Rehab - [facility]  Program Notes Walk 3 minutes, 3x/day, in your house.  Focus on your posture, step length, arm swing.    Exercises - Sit to Stand  - 1 x daily - 7 x weekly - 3 sets - 5  reps  GOALS: Goals reviewed with patient? Yes  SHORT TERM GOALS: Target date: 12/08/2023  Pt will be independent with HEP for improved balance, strength, gait. Baseline: Goal status: IN PROGRESS  2.  Pt will improve 5x sit<>stand to less than or equal to 18 sec to demonstrate improved functional strength and transfer efficiency. Baseline: 24 sec Goal status: IN PROGRESS  3.  Pt will improve TUG score to less than or equal to 15 sec for decreased fall risk. Baseline: 20 sec Goal status: IN PROGRESS    LONG TERM GOALS: Target date: 12/22/2023  Pt will be independent with HEP for improved strength, balance, gait, decreased pain. Baseline:  Goal status: IN PROGRESS  2.  Pt will improve 5x sit<>stand to less than or equal to 15 sec to demonstrate improved functional strength and transfer efficiency. Baseline: 24 sec Goal status: IN PROGRESS  3.  MiniBESTest score to be assessed and score to improve by 5 points for decreased fall risk  Baseline:  Goal status: IN PROGRESS  4.  43M walk backward to improve to less than or equal to 10 sec, for improved balance. Baseline: 13.87 sec Goal status: IN PROGRESS  5.  Pt will improve gait velocity to at least 2.62 ft/sec for improved gait efficiency and safety. Baseline: 2.12 ft/sec Goal status: IN PROGRESS  6.  Pt will verbalize understanding of local Parkinson's disease community resources, including fitness options, to maximize gains made in the community. Baseline:  Goal status: IN PROGRESS  ASSESSMENT:  CLINICAL IMPRESSION: Slight improvement in back pain from previous 6 to 5/10 presently.  Initiated session with NU-step performing speed intervals for rapid alternating movement with marked improvement in gait quality after performance from his initial step-to pattern to achieving reciprocal stride after NU-step.  Instructed in seated large amplitude movements to improve flexibility and coordination relying heavily on visual guidance  with improved carryover with subsequent reps. Continued sessions to progress POC details to improve mobility and reduce risk for falls.   OBJECTIVE IMPAIRMENTS: Abnormal gait, decreased balance, decreased mobility, difficulty walking, decreased ROM, decreased strength, impaired flexibility, postural dysfunction, and pain.   ACTIVITY LIMITATIONS: bending, sitting, standing, squatting, sleeping, transfers, dressing, and locomotion level  PARTICIPATION LIMITATIONS: meal prep, cleaning, laundry, shopping, community activity, and yard work  PERSONAL FACTORS: 3+ comorbidities: see above and per MD note, difficulty with compliance of PD meds  are also affecting patient's functional outcome.   REHAB POTENTIAL: Good  CLINICAL DECISION MAKING: Evolving/moderate complexity  EVALUATION COMPLEXITY: Moderate  PLAN:  PT FREQUENCY: 2x/week  PT DURATION: 6 weeks plus eval  PLANNED INTERVENTIONS: 97110-Therapeutic exercises, 97530- Therapeutic activity, [id number]- Neuromuscular re-education, 97535- Self Care, 72536- Manual therapy, [phone]- Gait training, Patient/Family education, and Balance training  PLAN FOR NEXT SESSION: Review Initial HEP and progress HEP; assess MiniBESTest; work on functional strength, SLS, ?PWR! Moves   4:20 PM, 11/27/23 M. Kelly Jacques Willingham, PT, DPT Physical Therapist- [place] Office Number: [phone]

## 2023-11-29 ENCOUNTER — Encounter: Payer: Self-pay | Admitting: Family Medicine

## 2023-11-29 ENCOUNTER — Ambulatory Visit: Payer: 59

## 2023-11-29 ENCOUNTER — Ambulatory Visit (INDEPENDENT_AMBULATORY_CARE_PROVIDER_SITE_OTHER): Payer: 59 | Admitting: Family Medicine

## 2023-11-29 VITALS — BP 110/70 | HR 78 | Temp 98.1°F | Resp 18 | Ht 66.0 in | Wt 205.0 lb

## 2023-11-29 DIAGNOSIS — I10 Essential (primary) hypertension: Secondary | ICD-10-CM | POA: Diagnosis not present

## 2023-11-29 DIAGNOSIS — Z0001 Encounter for general adult medical examination with abnormal findings: Secondary | ICD-10-CM

## 2023-11-29 DIAGNOSIS — M7061 Trochanteric bursitis, right hip: Secondary | ICD-10-CM

## 2023-11-29 DIAGNOSIS — M7062 Trochanteric bursitis, left hip: Secondary | ICD-10-CM | POA: Diagnosis not present

## 2023-11-29 DIAGNOSIS — Z0000 Encounter for general adult medical examination without abnormal findings: Secondary | ICD-10-CM

## 2023-11-29 MED ORDER — METHYLPREDNISOLONE ACETATE 40 MG/ML IJ SUSP
40.0000 mg | Freq: Once | INTRAMUSCULAR | Status: AC
  Administered 2023-11-29: 40 mg via INTRAMUSCULAR

## 2023-11-29 NOTE — Progress Notes (Signed)
 Chief Complaint  Patient presents with   Annual Exam    Pt states not fasting     Well Male Shawn Meza is here for a complete physical.   His last physical was >1 year ago.  Current diet: in general, a "good" diet.   Current exercise: limited 2/2 hip pain Weight trend: stable Fatigue out of ordinary? Chronic fatigue Seat belt? Yes.   Advanced directive? No  Health maintenance Shingrix- No Tetanus- Due Hep C- Yes Pneumonia vaccine- Yes  B/l outer hip pain for years. L is worse than the R. No recent inj or change in activity. No bruising, redness, swelling, decreased ROM. It is limiting his walking. Requesting steroid injections.   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   Diarrhea 11/04/2020   Elevated PSA    Erectile dysfunction    Essential hypertension    Essential tremor    GERD (gastroesophageal reflux disease)    Headache(784.0)    Hiatal hernia    Hypercholesterolemia    LBBB (left bundle branch block)    Long-term use of aspirin  therapy    Low back pain 10/28/2019   Major depression, chronic    Medial meniscus tear 10/11/2011   Metabolic syndrome    Morbid obesity (HCC)    Myofascial pain 12/20/2019   Nephrolithiasis    hx of   NSTEMI (non-ST elevated myocardial infarction) (HCC)    Parkinson's disease (HCC)    S/P CABG (coronary artery bypass graft)    Transient ischemic attack    hx of   Trochanteric bursitis of right hip      Past Surgical History:  Procedure Laterality Date   CARDIAC CATHETERIZATION  5/12,1/13   4 stents placed   COLON SURGERY     CORONARY ARTERY BYPASS GRAFT     KNEE ARTHROSCOPY  10/11/2011   Procedure: ARTHROSCOPY KNEE;  Surgeon: Genevie Kerns, MD;  Location: [facility];  Service: Orthopedics;  Laterality: Left;  Left Knee Arthroscopy with Medial and  Lateral Partial Menisectomy, Chondroplasty   LEFT HEART CATHETERIZATION WITH CORONARY ANGIOGRAM N/A 08/25/2011   Procedure: LEFT HEART CATHETERIZATION WITH CORONARY ANGIOGRAM;  Surgeon: Odie Benne, MD;  Location: [facility] C[facility] LAB;  Service: Cardiovascular;  Laterality: N/A;   LITHOTRIPSY     STERIOD INJECTION  10/11/2011   Procedure: STEROID INJECTION;  Surgeon: Genevie Kerns, MD;  Location: [facility];  Service: Orthopedics;  Laterality: Right;  Steroid Injection Second Toe   TRANSURETHRAL RESECTION OF PROSTATE     URETHRAL DILATION      Medications  Current Outpatient Medications on File Prior to Visit  Medication Sig Dispense Refill   acetaminophen  (TYLENOL ) 325 MG tablet Take 162.5 mg by mouth every 6 (six) hours as needed for mild pain or moderate pain.     aspirin  81 MG EC tablet Take 1 tablet (81 mg total) by mouth daily. 90 tablet 3   azelastine  (ASTELIN ) 0.1 % nasal spray Place 2 sprays into both nostrils 2 (two) times daily. Use in each nostril as directed 30 mL 12   beclomethasone (QVAR  REDIHALER) 40 MCG/ACT inhaler Inhale 2 puffs into the lungs 2 (two) times daily. 1 each 2   Carbidopa -Levodopa  ER (RYTARY ) 61.25-245 MG CPCR 1 four times per day, the last at bedtime 360 capsule  0   citalopram  (CELEXA ) 20 MG tablet Take 1 tablet (20 mg total) by mouth daily. 30 tablet 1   empagliflozin  (JARDIANCE ) 10 MG TABS tablet Take 10 mg by mouth daily.     esomeprazole  (NEXIUM ) 40 MG capsule Take 1 capsule (40 mg total) by mouth daily. 90 capsule 0   fluticasone  (FLONASE ) 50 MCG/ACT nasal spray Place 2 sprays into both nostrils daily. 16 g 2   furosemide  (LASIX ) 20 MG tablet Take 1 tablet (20 mg total) by mouth as needed for fluid or edema (shortness of breath). 30 tablet 1   gabapentin  (NEURONTIN ) 100 MG capsule Take 1 capsule (100 mg total) by mouth at bedtime. 180 capsule 1   levocetirizine (XYZAL ) 5 MG tablet Take 1 tablet (5 mg total) by mouth every evening. For  allergies 90 tablet 1   metoprolol  tartrate (LOPRESSOR ) 25 MG tablet Take 1 tablet (25 mg total) by mouth daily. 90 tablet 2   montelukast  (SINGULAIR ) 10 MG tablet Take 1 tablet (10 mg total) by mouth at bedtime. 90 tablet 1   nitroGLYCERIN  (NITROSTAT ) 0.4 MG SL tablet Place 1 tablet (0.4 mg total) under the tongue every 5 (five) minutes x 3 doses as needed for chest pain. If chest pain is not relieved after 2nd dose - call 911. 25 tablet 0   pravastatin  (PRAVACHOL ) 80 MG tablet Take 1 tablet (80 mg total) by mouth every evening. 90 tablet 3   sacubitril -valsartan  (ENTRESTO ) 49-51 MG TAKE 1 TABLET BY MOUTH 2 TIMES DAILY. 180 tablet 2   Allergies Allergies  Allergen Reactions   Tizanidine  Hcl Hives   Fluoxetine  Other (See Comments)    Caused depression and aggression   Rosuvastatin  Other (See Comments)    Whole body aches   Testosterone Other (See Comments)    ABDOMINAL PAIN and cramping   Ropinirole  Hcl Nausea Only   Requip  [Ropinirole ] Nausea Only    Family History Family History  Problem Relation Age of Onset   Heart disease Mother    Heart disease Father    Hyperlipidemia Father    Stroke Father    Hyperlipidemia Brother    Heart disease Brother    Coronary artery disease Other        family hx of male 1st degree relative ,42   Hyperlipidemia Other        family hx of   Hypertension Other        family hx of   Arthritis Other        family hx of   Healthy Daughter    Dementia Neg Hx     Review of Systems: Constitutional:  no fevers Eye:  no recent significant change in vision Ears:  No changes in hearing Nose/Mouth/Throat:  no complaints of nasal congestion, no sore throat Cardiovascular: no chest pain Respiratory:  No shortness of breath Gastrointestinal:  No change in bowel habits GU:  No frequency Integumentary:  no abnormal skin lesions reported Neurologic:  no headaches Endocrine:  denies unexplained weight changes  Exam BP 110/70 (BP Location: Left  Arm, Patient Position: Sitting, Cuff Size: Normal)   Pulse 78   Temp 98.1 F (36.7 C) (Oral)   Resp 18   Ht 5\' 6"  (1.676 m)   Wt 205 lb (93 kg)   SpO2 95%   BMI 33.09 kg/m  General:  well developed, well nourished, in no apparent distress Skin:  no significant moles, warts, or growths Head:  no masses, lesions, or tenderness Eyes:  pupils equal and round, sclera anicteric without injection Ears:  canals without lesions, TMs shiny without retraction, no obvious effusion, no erythema Nose:  nares patent, mucosa normal Throat/Pharynx:  lips and gingiva without lesion; tongue and uvula midline; non-inflamed pharynx; no exudates or postnasal drainage Lungs:  clear to auscultation, breath sounds equal bilaterally, no respiratory distress Cardio:  regular rate and rhythm, no LE edema or bruits Rectal: Deferred GI: BS+, S, NT, ND, no masses or organomegaly Musculoskeletal: + TTP over the bilateral greater trochanteric region.  Otherwise symmetrical muscle groups noted without atrophy or deformity Neuro:  gait normal; deep tendon reflexes normal and symmetric Psych: well oriented with normal range of affect and appropriate judgment/insight  Procedure note: Greater trochanteric bursa injection Verbal consent obtained. The area of interest was palpated and demarcated with an otoscope speculum. It was cleaned with an alcohol swab. Freeze spray was used. A 27 g needle was inserted at a perpendicular angle through the area of interested. The plunger was withdrawn to ensure our placement was not in a vessel. 2 mL of 1% lidocaine  without epi and 40 mg of Depo-Medrol  was injected. A bandaid was placed. This process was repeated on the contralateral side. The patient tolerated the procedure well.  There were no complications noted.   Assessment and Plan  Well adult exam  Greater trochanteric bursitis of both hips - Plan: methylPREDNISolone  acetate (DEPO-MEDROL ) injection 40 mg,  methylPREDNISolone  acetate (DEPO-MEDROL ) injection 40 mg  Essential hypertension - Plan: Comprehensive metabolic panel with GFR, Lipid panel   Well 78 y.o. male. Counseled on diet and exercise. Advanced directive form requested today.  Bursitis: Chronic, not controlled.  Heat, ice, Tylenol , stretches and exercises.  Steroid injections today.  Hopefully this gives him at least 90 days of relief. Shingrix and tetanus booster recommended to get at the pharmacy. Other orders as above. Follow up in 6 mo.  The patient voiced understanding and agreement to the plan.  Shellie Dials [place], DO 11/29/23 5:00 PM

## 2023-11-29 NOTE — Patient Instructions (Addendum)
 Give us  2-3 business days to get the results of your labs back.   Keep the diet clean and stay active.  Please get me a copy of your advanced directive form at your convenience.   The Shingrix vaccine (for shingles) is a 2 shot series spaced 2-6 months apart. It can make people feel low energy, achy and almost like they have the flu for 48 hours after injection. 1/5 people can have nausea and/or vomiting. Please plan accordingly when deciding on when to get this shot. Call your pharmacy to get this. The second shot of the series is less severe regarding the side effects, but it still lasts 48 hours.   Consider getting your tetanus shot at the pharmacy.   Let us  know if you need anything.

## 2023-11-30 ENCOUNTER — Encounter: Payer: Self-pay | Admitting: Family Medicine

## 2023-11-30 ENCOUNTER — Ambulatory Visit: Attending: Neurology

## 2023-11-30 ENCOUNTER — Ambulatory Visit

## 2023-11-30 DIAGNOSIS — R471 Dysarthria and anarthria: Secondary | ICD-10-CM | POA: Diagnosis not present

## 2023-11-30 DIAGNOSIS — R29818 Other symptoms and signs involving the nervous system: Secondary | ICD-10-CM | POA: Insufficient documentation

## 2023-11-30 DIAGNOSIS — G20A1 Parkinson's disease without dyskinesia, without mention of fluctuations: Secondary | ICD-10-CM

## 2023-11-30 DIAGNOSIS — M6281 Muscle weakness (generalized): Secondary | ICD-10-CM | POA: Insufficient documentation

## 2023-11-30 DIAGNOSIS — R2681 Unsteadiness on feet: Secondary | ICD-10-CM | POA: Diagnosis not present

## 2023-11-30 DIAGNOSIS — R2689 Other abnormalities of gait and mobility: Secondary | ICD-10-CM | POA: Insufficient documentation

## 2023-11-30 LAB — COMPREHENSIVE METABOLIC PANEL WITH GFR
ALT: 13 U/L (ref 0–53)
AST: 23 U/L (ref 0–37)
Albumin: 4.5 g/dL (ref 3.5–5.2)
Alkaline Phosphatase: 65 U/L (ref 39–117)
BUN: 27 mg/dL — ABNORMAL HIGH (ref 6–23)
CO2: 24 meq/L (ref 19–32)
Calcium: 9.1 mg/dL (ref 8.4–10.5)
Chloride: 103 meq/L (ref 96–112)
Creatinine, Ser: 1.21 mg/dL (ref 0.40–1.50)
GFR: 56.88 mL/min — ABNORMAL LOW (ref >60.00)
Glucose, Bld: 103 mg/dL — ABNORMAL HIGH (ref 70–99)
Potassium: 4 meq/L (ref 3.5–5.1)
Sodium: 138 meq/L (ref 135–145)
Total Bilirubin: 0.7 mg/dL (ref 0.2–1.2)
Total Protein: 7.7 g/dL (ref 6.0–8.3)

## 2023-11-30 LAB — LIPID PANEL
Cholesterol: 198 mg/dL (ref 0–200)
HDL: 48.9 mg/dL (ref >39.00)
LDL Cholesterol: 127 mg/dL — ABNORMAL HIGH (ref 0–99)
NonHDL: 149.43
Total CHOL/HDL Ratio: 4
Triglycerides: 111 mg/dL (ref 0.0–149.0)
VLDL: 22.2 mg/dL (ref 0.0–40.0)

## 2023-11-30 NOTE — Therapy (Signed)
 OUTPATIENT SPEECH LANGUAGE PATHOLOGY PARKINSON'S TREATMENT   Patient Name: Shawn Meza MRN: [id number] DOB:Jul 24, 1946, 78 y.o., male Today's Date: 11/30/2023  PCP: Dawna Etienne, DO REFERRING PROVIDER: Fran Imus, DO  END OF SESSION:  End of Session - 11/30/23 1623     Visit Number 2    Number of Visits 13   pt limited with visit count   Date for SLP Re-Evaluation 01/12/24   due to starting ST week of 11/30/23   SLP Start Time 1621    SLP Stop Time  1700    SLP Time Calculation (min) 39 min    Activity Tolerance Patient tolerated treatment well             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   Diarrhea 11/04/2020   Elevated PSA    Erectile dysfunction    Essential hypertension    Essential tremor    GERD (gastroesophageal reflux disease)    Headache(784.0)    Hiatal hernia    Hypercholesterolemia    LBBB (left bundle branch block)    Long-term use of aspirin  therapy    Low back pain 10/28/2019   Major depression, chronic    Medial meniscus tear 10/11/2011   Metabolic syndrome    Morbid obesity (HCC)    Myofascial pain 12/20/2019   Nephrolithiasis    hx of   NSTEMI (non-ST elevated myocardial infarction) (HCC)    Parkinson's disease (HCC)    S/P CABG (coronary artery bypass graft)    Transient ischemic attack    hx of   Trochanteric bursitis of right hip    Past Surgical History:  Procedure Laterality Date   CARDIAC CATHETERIZATION  5/12,1/13   4 stents placed   COLON SURGERY     CORONARY ARTERY BYPASS GRAFT     KNEE ARTHROSCOPY  10/11/2011   Procedure: ARTHROSCOPY KNEE;  Surgeon: Genevie Kerns, MD;  Location: [facility];  Service: Orthopedics;  Laterality: Left;  Left Knee Arthroscopy with Medial and Lateral Partial Menisectomy, Chondroplasty   LEFT HEART CATHETERIZATION  WITH CORONARY ANGIOGRAM N/A 08/25/2011   Procedure: LEFT HEART CATHETERIZATION WITH CORONARY ANGIOGRAM;  Surgeon: Odie Benne, MD;  Location: [facility] CATH LAB;  Service: Cardiovascular;  Laterality: N/A;   LITHOTRIPSY     STERIOD INJECTION  10/11/2011   Procedure: STEROID INJECTION;  Surgeon: Genevie Kerns, MD;  Location: [facility];  Service: Orthopedics;  Laterality: Right;  Steroid Injection Second Toe   TRANSURETHRAL RESECTION OF PROSTATE     URETHRAL DILATION     Patient Active Problem List   Diagnosis Date Noted   Major depressive disorder, recurrent episode, moderate (HCC) 06/30/2022   Lumbar spondylosis 06/29/2022   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   Diarrhea 11/04/2020   Rectal bleeding 11/04/2020   Trochanteric bursitis of right hip    Transient ischemic attack    NSTEMI (non-ST elevated myocardial infarction) (HCC)    Nephrolithiasis    Metabolic syndrome    Major depression, chronic    Long-term use of aspirin  therapy    Hypercholesterolemia    Hiatal hernia    GERD (gastroesophageal reflux disease)    Essential tremor    Essential hypertension    Erectile dysfunction    Elevated PSA    Colon cancer (HCC)  Chronic coronary artery disease    Arthritis    Myofascial pain 12/20/2019   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   Low back pain 10/28/2019   Radiculopathy, lumbar region 10/28/2019   Fatigue 01/15/2019   Chronic systolic (congestive) heart failure (HCC) 09/18/2018   Ischemic cardiomyopathy 09/16/2018   Aortic regurgitation 09/16/2018   Cardiomyopathy, unspecified (HCC) 06/13/2018   Abscess of right axilla 12/12/2017   BMI 33.0-33.9,adult 12/12/2017   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   Acute blood loss anemia 10/25/2017   Acute postoperative respiratory insufficiency 10/25/2017   Postoperative delirium 10/25/2017   Dyslipidemia 10/17/2017   Chest pain in adult 10/11/2017   SOB (shortness of breath)  03/31/2017   Long term current use of aspirin  03/29/2017   Parkinson's disease (HCC) 01/02/2017   Memory change 07/06/2015   Depression, recurrent (HCC) 07/06/2015   Medial meniscus tear 10/11/2011   Neuroma of foot 10/11/2011   Coronary artery disease involving native coronary artery of native heart with angina pectoris (HCC) 12/28/2010   OTHER TESTICULAR HYPOFUNCTION 05/20/2010   Mixed hyperlipidemia 05/20/2010   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/20/2010   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 05/20/2010   GERD 05/20/2010   History of cardiovascular disorder 05/20/2010   NEPHROLITHIASIS, HX OF 05/20/2010   BENIGN PROSTATIC HYPERTROPHY, HX OF, S/P TURP 05/20/2010    ONSET DATE: script dated 10/20/23  REFERRING DIAG:  G20.A1 (ICD-10-CM) - Parkinson's disease without dyskinesia or fluctuating manifestations (HCC)    THERAPY DIAG:  Dysarthria and anarthria  Rationale for Evaluation and Treatment: Rehabilitation  SUBJECTIVE:   SUBJECTIVE STATEMENT: Pt stated his brother was not asking him to repeat as much as last session.   Pt accompanied by: self  PERTINENT HISTORY: See above.  PAIN:  Are you having pain? No  FALLS: Has patient fallen since last session?  No  PATIENT GOALS: "It would be good if people said they could hear me."  OBJECTIVE:  Note: Objective measures were completed at Evaluation unless otherwise noted.  DIAGNOSTIC FINDINGS:  Neurology ([name]) - 10/20/23 [name] was seen today in follow up for Parkinsons disease.  My previous records were reviewed prior to todays visit as well as outside records available to me.  Patient is supposed to be on Rytary  245 mg 3 times per day.  It was last filled in September, 2024 per pharmacy records.  He reports he takes it faithfully but I last filled the med 1 year ago for a 6 month supply.  He c/o insomnia.  Has poor sleep hygiene.  Sees counseling.  He is pleased with this.  Having trouble with  speech/soft voice.   1.  Parkinsons Disease             -Clinically, he is always worse when he is off of levodopa .  Compliance continues to be an issue.  He has not filled his Rytary  since 04/21/23 (6 months ago).  Its generally taking him 6 months to go through 3 months worth of medication.  He and I discussed the importance of compliance and taking it regularly, which he states that he is.  [name] having more trouble at night so would be happy to increase it to qid dosing, last at bedtime but he would have to take it.  I told him he could take Rytary  245 mg 4 times per day, with the last at bedtime, but again encouraged him to take it each and every time.             -  Refer to speech and physical therapy   PATIENT REPORTED OUTCOME MEASURES (PROM): Communication Effectiveness Survey: provided first 1-2 sessions                                                                                                                            TREATMENT DATE:   11/30/23: Pt enters today initially with WNL speech at 71dB for first 6 minutes. SLP led pt through loud /a/, and had to shape for 5 minutes due to insufficient loudness, laryngeal squeeze, and "a" (as in cat) instead of /a/. Pt with mod I after 14 reps.  Maintained WNL volume throughout session. Pt can reduce frequency to x1/week. He mentioned to SLP concern about gas to drive here. SLP suggested 2 follow up appointmetns and pt agreed.   11/06/23: SLP reviewed results of eval, educated re: importance of completing homework, why pts state they feel they are shouting when they are WNL volume, diagnostic therapy (as above) pt incr'd sentence level responses to WNL loudness with occasional mod cues. SLP shaped pt's loud "ah" to perform BID x10 reps at home until next session.   PATIENT EDUCATION: Education details: see "treatment date" Person educated: Patient Education method: Explanation, Demonstration, and Verbal cues Education comprehension: verbalized  understanding, returned demonstration, verbal cues required, and needs further education  HOME EXERCISE PROGRAM: 10 loud "ah" BID   GOALS: Goals reviewed with patient? Yes generally  SHORT TERM GOALS: Target date: 12/29/23 (pt's first ST visit is 11/30/23)  pt will produce loud /a/ with at least average 86dB average over three sessions Baseline: Goal status: INITIAL  2.  Pt will produce 16/20 sentence responses with WNL volume over two sessions Baseline:  Goal status: INITIAL  3.  Pt will produce 3 minutes simple conversation with WNL volume over three sessions Baseline:  Goal status: INITIAL  4.  Pt will provide SLP 3 s/sx aspiration PNA with modified indpendence Baseline:  Goal status: INITIAL   LONG TERM GOALS: Target date: 01/12/24  Pt will improve PROM compared to original score Baseline:  Goal status: INITIAL  2.  Pt will produce 5 minutes simple conversation with WNL volume over three sessions Baseline:  Goal status: INITIAL  3.  When pt is asked to repeat due to lower than WNL volume he will improve volume to WNL 95% of the time, in three sessions Baseline:  Goal status: INITIAL   ASSESSMENT:  CLINICAL IMPRESSION: Patient is a 78 y.o. M who was seen today for treatment of speech volume in light of PD. See "treatment date" above for today's date for further details on today's session. He stated he would like to be able to have people understand him more frequently than currently.  OBJECTIVE IMPAIRMENTS: Objective impairments include dysarthria. These impairments are limiting patient from managing medications, managing appointments, household responsibilities, ADLs/IADLs, and effectively communicating at home and in community.Factors affecting potential to achieve goals and functional outcome are cooperation/participation level and family/community support.. Patient  will benefit from skilled SLP services to address above impairments and improve overall  function.  REHAB POTENTIAL: Fair based on aforementioned factors  PLAN:  SLP FREQUENCY: 1-2x/week  SLP DURATION:  12 sessions  PLANNED INTERVENTIONS: Environmental controls, Internal/external aids, Functional tasks, Multimodal communication approach, SLP instruction and feedback, Compensatory strategies, Patient/family education, and [address]81 Treatment of speech (30 or 45 min)     Rene Sizelove, CCC-SLP 11/30/2023, 4:24 PM

## 2023-11-30 NOTE — Therapy (Signed)
 OUTPATIENT PHYSICAL THERAPY NEURO TREATMENT   Patient Name: Shawn Meza MRN: [id number] DOB:[birth year], 78 y.o., male Today's Date: 11/30/2023   PCP: Jobe Mulder, DO  REFERRING PROVIDER: Shirline Dover, DO   END OF SESSION:  PT End of Session - 11/30/23 1536     Visit Number 5    Number of Visits 13    Date for PT Re-Evaluation 12/22/23    Authorization Type [employer] Medicare-Medicaid    Authorization Time Period auth not required; 27 visits combined    Progress Note Due on Visit 10    PT Start Time 1530    PT Stop Time 1615    PT Time Calculation (min) 45 min    Activity Tolerance Patient tolerated treatment well;Patient limited by pain    Behavior During Therapy [facility] for tasks assessed/performed              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   Diarrhea 11/04/2020   Elevated PSA    Erectile dysfunction    Essential hypertension    Essential tremor    GERD (gastroesophageal reflux disease)    Headache(784.0)    Hiatal hernia    Hypercholesterolemia    LBBB (left bundle branch block)    Long-term use of aspirin  therapy    Low back pain 10/28/2019   Major depression, chronic    Medial meniscus tear 10/11/2011   Metabolic syndrome    Morbid obesity (HCC)    Myofascial pain 12/20/2019   Nephrolithiasis    hx of   NSTEMI (non-ST elevated myocardial infarction) (HCC)    Parkinson's disease (HCC)    S/P CABG (coronary artery bypass graft)    Transient ischemic attack    hx of   Trochanteric bursitis of right hip    Past Surgical History:  Procedure Laterality Date   CARDIAC CATHETERIZATION  5/12,1/13   4 stents placed   COLON SURGERY     CORONARY ARTERY BYPASS GRAFT     KNEE ARTHROSCOPY  10/11/2011   Procedure: ARTHROSCOPY KNEE;  Surgeon: Genevie Kerns, MD;  Location: [facility];  Service: Orthopedics;  Laterality: Left;  Left Knee Arthroscopy with Medial and Lateral Partial Menisectomy, Chondroplasty   LEFT HEART CATHETERIZATION WITH CORONARY ANGIOGRAM N/A 08/25/2011   Procedure: LEFT HEART CATHETERIZATION WITH CORONARY ANGIOGRAM;  Surgeon: Odie Benne, MD;  Location: [facility] C[facility] LAB;  Service: Cardiovascular;  Laterality: N/A;   LITHOTRIPSY     STERIOD INJECTION  10/11/2011   Procedure: STEROID INJECTION;  Surgeon: Genevie Kerns, MD;  Location:  SURGERY CENTER;  Service: Orthopedics;  Laterality: Right;  Steroid Injection Second Toe   TRANSURETHRAL RESECTION OF PROSTATE     URETHRAL DILATION     Patient Active Problem List   Diagnosis Date Noted   Major depressive disorder, recurrent episode, moderate (HCC) 06/30/2022   Lumbar spondylosis 06/29/2022   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   Diarrhea 11/04/2020   Rectal bleeding 11/04/2020   Trochanteric bursitis of right hip    Transient ischemic attack    NSTEMI (non-ST elevated myocardial infarction) (HCC)    Nephrolithiasis    Metabolic syndrome    Major depression, chronic    Long-term use of aspirin  therapy    Hypercholesterolemia    Hiatal hernia  GERD (gastroesophageal reflux disease)    Essential tremor    Essential hypertension    Erectile dysfunction    Elevated PSA    Colon cancer (HCC)    Chronic coronary artery disease    Arthritis    Myofascial pain 12/20/2019   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   Low back pain 10/28/2019   Radiculopathy, lumbar region 10/28/2019   Fatigue 01/15/2019   Chronic systolic (congestive) heart failure (HCC) 09/18/2018   Ischemic cardiomyopathy 09/16/2018   Aortic regurgitation 09/16/2018   Cardiomyopathy, unspecified (HCC) 06/13/2018   Abscess of right axilla 12/12/2017   BMI 33.0-33.9,adult 12/12/2017   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   Acute blood loss anemia 10/25/2017   Acute  postoperative respiratory insufficiency 10/25/2017   Postoperative delirium 10/25/2017   Dyslipidemia 10/17/2017   Chest pain in adult 10/11/2017   SOB (shortness of breath) 03/31/2017   Long term current use of aspirin  03/29/2017   Parkinson's disease (HCC) 01/02/2017   Memory change 07/06/2015   Depression, recurrent (HCC) 07/06/2015   Medial meniscus tear 10/11/2011   Neuroma of foot 10/11/2011   Coronary artery disease involving native coronary artery of native heart with angina pectoris (HCC) 12/28/2010   OTHER TESTICULAR HYPOFUNCTION 05/20/2010   Mixed hyperlipidemia 05/20/2010   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/20/2010   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 05/20/2010   GERD 05/20/2010   History of cardiovascular disorder 05/20/2010   NEPHROLITHIASIS, HX OF 05/20/2010   BENIGN PROSTATIC HYPERTROPHY, HX OF, S/P TURP 05/20/2010    ONSET DATE: 10/20/2023 (MD referral)  REFERRING DIAG: G20.A1 (ICD-10-CM) - Parkinson's disease without dyskinesia or fluctuating manifestations (HCC)   THERAPY DIAG:  Unsteadiness on feet  Muscle weakness (generalized)  Other abnormalities of gait and mobility  Other symptoms and signs involving the nervous system  Parkinson's disease, unspecified whether dyskinesia present, unspecified whether manifestations fluctuate (HCC)  Rationale for Evaluation and Treatment: Rehabilitation  SUBJECTIVE:                                                                                                                                                                                             SUBJECTIVE STATEMENT: Got two shots in the back yesterday and feeling a little better.   Pt accompanied by: self  PERTINENT HISTORY: low back and hip pain  PAIN:  Are you having pain? Yes: NPRS scale: 5-6/10 Pain location: low back, R buttock Pain description: tightness, achiness Aggravating factors: standing Relieving factors:  sitting  PRECAUTIONS: Fall  RED FLAGS: None   WEIGHT BEARING RESTRICTIONS: No  FALLS: Has patient fallen in  last 6 months? No  LIVING ENVIRONMENT: Lives with: lives alone Lives in: House/apartment Stairs: No Has following equipment at home: Single point cane and Environmental consultant - 4 wheeled  PLOF: Independent and Leisure: Enjoys golfing,   PATIENT GOALS: Wants to build up my strength and balance-especially Single leg stance with getting dressed  OBJECTIVE:   TODAY'S TREATMENT: 11/30/23 Activity Comments  NU-step x 9 min resistance level 3 Cues for maintaining 60-70 SPM with dual-tasking activities  Pivot turns transferring cones 1x10 1x10 w/ dual task  Multisensory balance on compliant surfaces   Reactive balance   Retrowalk w/ cga x 45 ft         TODAY'S TREATMENT: 11/27/23 Activity Comments  Biofreeze applied to lumbar Trigger poin in right QL   NU-step speed intervals  x 9 min 2 min warmup 30 sec speed; 60 sec recovery  Seated PWR moves Demo of video for home tutorial 1x10 w/ therapist guiding w/ increased speed                 HOME EXERCISE PROGRAM Last updated: 11/20/23 Access Code: [email or web address] URL: [email or web address] Date: 11/20/2023 Prepared by: [facility] - Outpatient  Rehab - [facility]  Exercises - Seated Pelvic Tilt  - 1 x daily - 5 x weekly - 2 sets - 10 reps - Seated Figure 4 Piriformis Stretch  - 1 x daily - 5 x weekly - 2 sets - 30 sec hold   PATIENT EDUCATION: Education details: edu on rigidity contributing to LB stiffness and discomfort, possible need for stretching program to manage pain. Troubleshooting through different positions with HEP for max success and safety, HEP update  Person educated: Patient Education method: Explanation, Demonstration, Tactile cues, Verbal cues, and Handouts Education comprehension: verbalized understanding and returned demonstration      Note: Objective measures were completed at Evaluation  unless otherwise noted.  DIAGNOSTIC FINDINGS: NA for this episode  COGNITION: Overall cognitive status: Within functional limits for tasks assessed   SENSATION: Light touch: WFL  COORDINATION: Slowed alternating BLE tapping with LLE   POSTURE: rounded shoulders and LUE and LLE tremor  LOWER EXTREMITY ROM:     Active  Right Eval Left Eval  Hip flexion    Hip extension    Hip abduction    Hip adduction    Hip internal rotation    Hip external rotation    Knee flexion    Knee extension  -12  Ankle dorsiflexion 10 5  Ankle plantarflexion    Ankle inversion    Ankle eversion     (Blank rows = not tested)  LOWER EXTREMITY MMT:    MMT Right Eval Left Eval  Hip flexion 4 3+  Hip extension    Hip abduction    Hip adduction    Hip internal rotation    Hip external rotation    Knee flexion 4 3+  Knee extension 4 3+  Ankle dorsiflexion 4 3-  Ankle plantarflexion    Ankle inversion    Ankle eversion    (Blank rows = not tested)  BED MOBILITY:  Pt reports sleeping in his recliner  TRANSFERS: Assistive device utilized: None  Sit to stand: SBA Stand to sit: SBA  GAIT: Gait pattern: step through pattern, decreased arm swing- Left, decreased step length- Left, decreased trunk rotation, and poor foot clearance- Left Distance walked: 50 ft x 2 Assistive device utilized: None Level of assistance: Modified independence   FUNCTIONAL TESTS:  5 times sit to stand: 24.07  sec Timed up and go (TUG): 20.59 sec 10 meter walk test: 15.5 sec (2.12 ft/sec) 3 M walk backwards :  13.87 sec 360 turn to L: 10.78 sec 360 turn to R: 8.6 sec                                                                                                                              TREATMENT DATE: 11/06/2023    PATIENT EDUCATION: Education details: Eval results, POC Person educated: Patient Education method: Explanation Education comprehension: verbalized understanding  HOME EXERCISE  PROGRAM: Access Code: [email or web address] URL: [email or web address] Date: 11/06/2023 Prepared by: [facility] - Outpatient  Rehab - [facility]  Program Notes Walk 3 minutes, 3x/day, in your house.  Focus on your posture, step length, arm swing.    Exercises - Sit to Stand  - 1 x daily - 7 x weekly - 3 sets - 5 reps  GOALS: Goals reviewed with patient? Yes  SHORT TERM GOALS: Target date: 12/08/2023  Pt will be independent with HEP for improved balance, strength, gait. Baseline: Goal status: IN PROGRESS  2.  Pt will improve 5x sit<>stand to less than or equal to 18 sec to demonstrate improved functional strength and transfer efficiency. Baseline: 24 sec Goal status: IN PROGRESS  3.  Pt will improve TUG score to less than or equal to 15 sec for decreased fall risk. Baseline: 20 sec Goal status: IN PROGRESS    LONG TERM GOALS: Target date: 12/22/2023  Pt will be independent with HEP for improved strength, balance, gait, decreased pain. Baseline:  Goal status: IN PROGRESS  2.  Pt will improve 5x sit<>stand to less than or equal to 15 sec to demonstrate improved functional strength and transfer efficiency. Baseline: 24 sec Goal status: IN PROGRESS  3.  MiniBESTest score to be assessed and score to improve by 5 points for decreased fall risk  Baseline:  Goal status: IN PROGRESS  4.  98M walk backward to improve to less than or equal to 10 sec, for improved balance. Baseline: 13.87 sec Goal status: IN PROGRESS  5.  Pt will improve gait velocity to at least 2.62 ft/sec for improved gait efficiency and safety. Baseline: 2.12 ft/sec Goal status: IN PROGRESS  6.  Pt will verbalize understanding of local Parkinson's disease community resources, including fitness options, to maximize gains made in the community. Baseline:  Goal status: IN PROGRESS  ASSESSMENT:  CLINICAL IMPRESSION: Reports reduced back pain from recent injections by MD.  NU-step for rapid alternating  movements with combination of dual-tasking activity requiring cues for maintenance of motor task.  Pivot turns to improve large amplitude step length/height for improved safety during ADL/kitchen activities and combination of dual-tasking for divided attention with improved ability to reduce freezing during movement but at the expense of the cognitive component.  Multisensory balance activities to improve postural stability and proprioceptive awareness with improved balance appreciated exhibiting mild sway x 30 sec eyes closed on  compliant surface.  Retro-walking to improve single limb support and hip extension to facilitate hip/stepping strategy. Continued sesions to progress POC details to improve mobility and reduce risk for falls.  OBJECTIVE IMPAIRMENTS: Abnormal gait, decreased balance, decreased mobility, difficulty walking, decreased ROM, decreased strength, impaired flexibility, postural dysfunction, and pain.   ACTIVITY LIMITATIONS: bending, sitting, standing, squatting, sleeping, transfers, dressing, and locomotion level  PARTICIPATION LIMITATIONS: meal prep, cleaning, laundry, shopping, community activity, and yard work  PERSONAL FACTORS: 3+ comorbidities: see above and per MD note, difficulty with compliance of PD meds  are also affecting patient's functional outcome.   REHAB POTENTIAL: Good  CLINICAL DECISION MAKING: Evolving/moderate complexity  EVALUATION COMPLEXITY: Moderate  PLAN:  PT FREQUENCY: 2x/week  PT DURATION: 6 weeks plus eval  PLANNED INTERVENTIONS: 97110-Therapeutic exercises, 97530- Therapeutic activity, [id number]- Neuromuscular re-education, 97535- Self Care, [id number]- Manual therapy, [phone]- Gait training, Patient/Family education, and Balance training  PLAN FOR NEXT SESSION: Review Initial HEP and progress HEP; assess MiniBESTest; work on functional strength, SLS, ?PWR! Moves   3:36 PM, 11/30/23 M. Kelly Kasin Tonkinson, PT, DPT Physical Therapist- [place] Office  Number: [phone]

## 2023-12-04 ENCOUNTER — Ambulatory Visit

## 2023-12-04 ENCOUNTER — Encounter

## 2023-12-04 DIAGNOSIS — G20A1 Parkinson's disease without dyskinesia, without mention of fluctuations: Secondary | ICD-10-CM

## 2023-12-04 DIAGNOSIS — R29818 Other symptoms and signs involving the nervous system: Secondary | ICD-10-CM

## 2023-12-04 DIAGNOSIS — R2681 Unsteadiness on feet: Secondary | ICD-10-CM | POA: Diagnosis not present

## 2023-12-04 DIAGNOSIS — M6281 Muscle weakness (generalized): Secondary | ICD-10-CM

## 2023-12-04 DIAGNOSIS — R2689 Other abnormalities of gait and mobility: Secondary | ICD-10-CM

## 2023-12-04 DIAGNOSIS — R471 Dysarthria and anarthria: Secondary | ICD-10-CM | POA: Diagnosis not present

## 2023-12-04 NOTE — Therapy (Signed)
 OUTPATIENT PHYSICAL THERAPY NEURO TREATMENT   Patient Name: Shawn Meza MRN: [id number] DOB:Jul 30, 1946, 78 y.o., male Today's Date: 12/04/2023   PCP: Jobe Mulder, DO  REFERRING PROVIDER: Shirline Dover, DO   END OF SESSION:  PT End of Session - 12/04/23 1625     Visit Number 6    Number of Visits 13    Date for PT Re-Evaluation 12/22/23    Authorization Type [employer] Medicare-Medicaid    Authorization Time Period auth not required; 27 visits combined    Progress Note Due on Visit 10    PT Start Time 1620    PT Stop Time 1700    PT Time Calculation (min) 40 min    Activity Tolerance Patient tolerated treatment well;Patient limited by pain    Behavior During Therapy [facility] for tasks assessed/performed              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   Diarrhea 11/04/2020   Elevated PSA    Erectile dysfunction    Essential hypertension    Essential tremor    GERD (gastroesophageal reflux disease)    Headache(784.0)    Hiatal hernia    Hypercholesterolemia    LBBB (left bundle branch block)    Long-term use of aspirin  therapy    Low back pain 10/28/2019   Major depression, chronic    Medial meniscus tear 10/11/2011   Metabolic syndrome    Morbid obesity (HCC)    Myofascial pain 12/20/2019   Nephrolithiasis    hx of   NSTEMI (non-ST elevated myocardial infarction) (HCC)    Parkinson's disease (HCC)    S/P CABG (coronary artery bypass graft)    Transient ischemic attack    hx of   Trochanteric bursitis of right hip    Past Surgical History:  Procedure Laterality Date   CARDIAC CATHETERIZATION  5/12,1/13   4 stents placed   COLON SURGERY     CORONARY ARTERY BYPASS GRAFT     KNEE ARTHROSCOPY  10/11/2011   Procedure: ARTHROSCOPY KNEE;  Surgeon: Genevie Kerns, MD;  Location: [facility];  Service: Orthopedics;  Laterality: Left;  Left Knee Arthroscopy with Medial and Lateral Partial Menisectomy, Chondroplasty   LEFT HEART CATHETERIZATION WITH CORONARY ANGIOGRAM N/A 08/25/2011   Procedure: LEFT HEART CATHETERIZATION WITH CORONARY ANGIOGRAM;  Surgeon: Odie Benne, MD;  Location: [facility] C[facility];  Service: Cardiovascular;  Laterality: N/A;   LITHOTRIPSY     STERIOD INJECTION  10/11/2011   Procedure: STEROID INJECTION;  Surgeon: Genevie Kerns, MD;  Location: [facility];  Service: Orthopedics;  Laterality: Right;  Steroid Injection Second Toe   TRANSURETHRAL RESECTION OF PROSTATE     URETHRAL DILATION     Patient Active Problem List   Diagnosis Date Noted   Major depressive disorder, recurrent episode, moderate (HCC) 06/30/2022   Lumbar spondylosis 06/29/2022   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   Diarrhea 11/04/2020   Rectal bleeding 11/04/2020   Trochanteric bursitis of right hip    Transient ischemic attack    NSTEMI (non-ST elevated myocardial infarction) (HCC)    Nephrolithiasis    Metabolic syndrome    Major depression, chronic    Long-term use of aspirin  therapy    Hypercholesterolemia    Hiatal hernia  GERD (gastroesophageal reflux disease)    Essential tremor    Essential hypertension    Erectile dysfunction    Elevated PSA    Colon cancer (HCC)    Chronic coronary artery disease    Arthritis    Myofascial pain 12/20/2019   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   Low back pain 10/28/2019   Radiculopathy, lumbar region 10/28/2019   Fatigue 01/15/2019   Chronic systolic (congestive) heart failure (HCC) 09/18/2018   Ischemic cardiomyopathy 09/16/2018   Aortic regurgitation 09/16/2018   Cardiomyopathy, unspecified (HCC) 06/13/2018   Abscess of right axilla 12/12/2017   BMI 33.0-33.9,adult 12/12/2017   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   Acute blood loss anemia 10/25/2017   Acute  postoperative respiratory insufficiency 10/25/2017   Postoperative delirium 10/25/2017   Dyslipidemia 10/17/2017   Chest pain in adult 10/11/2017   SOB (shortness of breath) 03/31/2017   Long term current use of aspirin  03/29/2017   Parkinson's disease (HCC) 01/02/2017   Memory change 07/06/2015   Depression, recurrent (HCC) 07/06/2015   Medial meniscus tear 10/11/2011   Neuroma of foot 10/11/2011   Coronary artery disease involving native coronary artery of native heart with angina pectoris (HCC) 12/28/2010   OTHER TESTICULAR HYPOFUNCTION 05/20/2010   Mixed hyperlipidemia 05/20/2010   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/20/2010   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 05/20/2010   GERD 05/20/2010   History of cardiovascular disorder 05/20/2010   NEPHROLITHIASIS, HX OF 05/20/2010   BENIGN PROSTATIC HYPERTROPHY, HX OF, S/P TURP 05/20/2010    ONSET DATE: 10/20/2023 (MD referral)  REFERRING DIAG: G20.A1 (ICD-10-CM) - Parkinson's disease without dyskinesia or fluctuating manifestations (HCC)   THERAPY DIAG:  Unsteadiness on feet  Muscle weakness (generalized)  Other abnormalities of gait and mobility  Other symptoms and signs involving the nervous system  Parkinson's disease, unspecified whether dyskinesia present, unspecified whether manifestations fluctuate (HCC)  Rationale for Evaluation and Treatment: Rehabilitation  SUBJECTIVE:                                                                                                                                                                                             SUBJECTIVE STATEMENT: The back has been feeling better since getting the shots   Pt accompanied by: self  PERTINENT HISTORY: low back and hip pain  PAIN:  Are you having pain? Yes: NPRS scale: 4-5/10 Pain location: low back, R buttock Pain description: tightness, achiness Aggravating factors: standing Relieving factors:  sitting  PRECAUTIONS: Fall  RED FLAGS: None   WEIGHT BEARING RESTRICTIONS: No  FALLS: Has patient fallen in last 6  months? No  LIVING ENVIRONMENT: Lives with: lives alone Lives in: House/apartment Stairs: No Has following equipment at home: Single point cane and Environmental consultant - 4 wheeled  PLOF: Independent and Leisure: Enjoys golfing,   PATIENT GOALS: Wants to build up my strength and balance-especially Single leg stance with getting dressed  OBJECTIVE:    TODAY'S TREATMENT: 12/04/23 Activity Comments  NU-step level 5 x 10 min Speed intervals  Developed machine-based strength program for gym use Provided print outs to exhibit: seated chest press, seated row, leg press, knee extension, and seated hamstring curls  Seated row 2x10 20#  Seated hamstring curls 2x10 10# cable  Seated LAQ 2x10 10# cable        TODAY'S TREATMENT: 11/30/23 Activity Comments  NU-step x 9 min resistance level 3 Cues for maintaining 60-70 SPM with dual-tasking activities  Pivot turns transferring cones 1x10 1x10 w/ dual task  Multisensory balance on compliant surfaces   Reactive balance   Retrowalk w/ cga x 45 ft                HOME EXERCISE PROGRAM Last updated: 11/20/23 Access Code: [email or web address] URL: [email or web address] Date: 11/20/2023 Prepared by: [facility] - Outpatient  Rehab - [facility]  Exercises - Seated Pelvic Tilt  - 1 x daily - 5 x weekly - 2 sets - 10 reps - Seated Figure 4 Piriformis Stretch  - 1 x daily - 5 x weekly - 2 sets - 30 sec hold   PATIENT EDUCATION: Education details: edu on rigidity contributing to LB stiffness and discomfort, possible need for stretching program to manage pain. Troubleshooting through different positions with HEP for max success and safety, HEP update  Person educated: Patient Education method: Explanation, Demonstration, Tactile cues, Verbal cues, and Handouts Education comprehension: verbalized understanding and returned  demonstration      Note: Objective measures were completed at Evaluation unless otherwise noted.  DIAGNOSTIC FINDINGS: NA for this episode  COGNITION: Overall cognitive status: Within functional limits for tasks assessed   SENSATION: Light touch: WFL  COORDINATION: Slowed alternating BLE tapping with LLE   POSTURE: rounded shoulders and LUE and LLE tremor  LOWER EXTREMITY ROM:     Active  Right Eval Left Eval  Hip flexion    Hip extension    Hip abduction    Hip adduction    Hip internal rotation    Hip external rotation    Knee flexion    Knee extension  -12  Ankle dorsiflexion 10 5  Ankle plantarflexion    Ankle inversion    Ankle eversion     (Blank rows = not tested)  LOWER EXTREMITY MMT:    MMT Right Eval Left Eval  Hip flexion 4 3+  Hip extension    Hip abduction    Hip adduction    Hip internal rotation    Hip external rotation    Knee flexion 4 3+  Knee extension 4 3+  Ankle dorsiflexion 4 3-  Ankle plantarflexion    Ankle inversion    Ankle eversion    (Blank rows = not tested)  BED MOBILITY:  Pt reports sleeping in his recliner  TRANSFERS: Assistive device utilized: None  Sit to stand: SBA Stand to sit: SBA  GAIT: Gait pattern: step through pattern, decreased arm swing- Left, decreased step length- Left, decreased trunk rotation, and poor foot clearance- Left Distance walked: 50 ft x 2 Assistive device utilized: None Level of assistance: Modified independence   FUNCTIONAL TESTS:  5  times sit to stand: 24.07 sec Timed up and go (TUG): 20.59 sec 10 meter walk test: 15.5 sec (2.12 ft/sec) 3 M walk backwards :  13.87 sec 360 turn to L: 10.78 sec 360 turn to R: 8.6 sec                                                                                                                              TREATMENT DATE: 11/06/2023    PATIENT EDUCATION: Education details: Eval results, POC Person educated: Patient Education method:  Explanation Education comprehension: verbalized understanding  HOME EXERCISE PROGRAM: Access Code: [email or web address] URL: [email or web address] Date: 11/06/2023 Prepared by: [facility] - Outpatient  Rehab - [facility]  Program Notes Walk 3 minutes, 3x/day, in your house.  Focus on your posture, step length, arm swing.    Exercises - Sit to Stand  - 1 x daily - 7 x weekly - 3 sets - 5 reps  GOALS: Goals reviewed with patient? Yes  SHORT TERM GOALS: Target date: 12/08/2023  Pt will be independent with HEP for improved balance, strength, gait. Baseline: Goal status: IN PROGRESS  2.  Pt will improve 5x sit<>stand to less than or equal to 18 sec to demonstrate improved functional strength and transfer efficiency. Baseline: 24 sec Goal status: IN PROGRESS  3.  Pt will improve TUG score to less than or equal to 15 sec for decreased fall risk. Baseline: 20 sec Goal status: IN PROGRESS    LONG TERM GOALS: Target date: 12/22/2023  Pt will be independent with HEP for improved strength, balance, gait, decreased pain. Baseline:  Goal status: IN PROGRESS  2.  Pt will improve 5x sit<>stand to less than or equal to 15 sec to demonstrate improved functional strength and transfer efficiency. Baseline: 24 sec Goal status: IN PROGRESS  3.  MiniBESTest score to be assessed and score to improve by 5 points for decreased fall risk  Baseline:  Goal status: IN PROGRESS  4.  62M walk backward to improve to less than or equal to 10 sec, for improved balance. Baseline: 13.87 sec Goal status: IN PROGRESS  5.  Pt will improve gait velocity to at least 2.62 ft/sec for improved gait efficiency and safety. Baseline: 2.12 ft/sec Goal status: IN PROGRESS  6.  Pt will verbalize understanding of local Parkinson's disease community resources, including fitness options, to maximize gains made in the community. Baseline:  Goal status: IN PROGRESS  ASSESSMENT:  CLINICAL  IMPRESSION: NU-step speed intervals to improve rapid alternating movement and instructed in program to replicate at gym for HIIT.  Instructed in seated [place] PRE and relevant pictures of gym equipment to replicate and educated on benefits of PRE.  Tolerated well with weakness more evident LUE and LLE as he was able to typically perform movement at double resistance on right vs left. Continued sessions to refine HEP as he reports he intends to D/C soon and perform activities at a gym more local  to his home.   OBJECTIVE IMPAIRMENTS: Abnormal gait, decreased balance, decreased mobility, difficulty walking, decreased ROM, decreased strength, impaired flexibility, postural dysfunction, and pain.   ACTIVITY LIMITATIONS: bending, sitting, standing, squatting, sleeping, transfers, dressing, and locomotion level  PARTICIPATION LIMITATIONS: meal prep, cleaning, laundry, shopping, community activity, and yard work  PERSONAL FACTORS: 3+ comorbidities: see above and per MD note, difficulty with compliance of PD meds  are also affecting patient's functional outcome.   REHAB POTENTIAL: Good  CLINICAL DECISION MAKING: Evolving/moderate complexity  EVALUATION COMPLEXITY: Moderate  PLAN:  PT FREQUENCY: 2x/week  PT DURATION: 6 weeks plus eval  PLANNED INTERVENTIONS: 97110-Therapeutic exercises, 97530- Therapeutic activity, [id number]- Neuromuscular re-education, 97535- Self Care, [id number]- Manual therapy, [id number]- Gait training, Patient/Family education, and Balance training  PLAN FOR NEXT SESSION: Review Initial HEP and progress HEP; assess MiniBESTest; work on functional strength, SLS, ?PWR! Moves Develop gym routine for strength/cardio HIIT split   4:25 PM, 12/04/23 M. Kelly Rafiel Mecca, PT, DPT Physical Therapist- [place] Office Number: [phone]

## 2023-12-06 ENCOUNTER — Ambulatory Visit (INDEPENDENT_AMBULATORY_CARE_PROVIDER_SITE_OTHER): Admitting: Psychology

## 2023-12-06 DIAGNOSIS — F331 Major depressive disorder, recurrent, moderate: Secondary | ICD-10-CM
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
## 2023-12-07 ENCOUNTER — Ambulatory Visit

## 2023-12-11 ENCOUNTER — Encounter: Admitting: Speech Pathology

## 2023-12-14 ENCOUNTER — Encounter

## 2023-12-14 ENCOUNTER — Ambulatory Visit

## 2023-12-18 ENCOUNTER — Encounter

## 2023-12-20 ENCOUNTER — Ambulatory Visit (INDEPENDENT_AMBULATORY_CARE_PROVIDER_SITE_OTHER): Admitting: Psychology

## 2023-12-20 DIAGNOSIS — F331 Major depressive disorder, recurrent, moderate: Secondary | ICD-10-CM

## 2023-12-20 DIAGNOSIS — F329 Major depressive disorder, single episode, unspecified: Secondary | ICD-10-CM | POA: Diagnosis not present

## 2023-12-20 DIAGNOSIS — F419 Anxiety disorder, unspecified: Secondary | ICD-10-CM
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
## 2023-12-21 ENCOUNTER — Encounter

## 2023-12-21 DIAGNOSIS — I447 Left bundle-branch block, unspecified: Secondary | ICD-10-CM | POA: Insufficient documentation

## 2023-12-26 ENCOUNTER — Ambulatory Visit: Payer: 59 | Admitting: Cardiology

## 2023-12-27 ENCOUNTER — Ambulatory Visit: Admitting: Cardiology

## 2023-12-28 ENCOUNTER — Encounter

## 2024-01-01 ENCOUNTER — Encounter

## 2024-01-03 ENCOUNTER — Ambulatory Visit: Admitting: Psychology

## 2024-01-04 ENCOUNTER — Ambulatory Visit

## 2024-01-05 ENCOUNTER — Ambulatory Visit: Admitting: *Deleted

## 2024-01-05 VITALS — Ht 66.0 in | Wt 205.0 lb

## 2024-01-05 DIAGNOSIS — Z9289 Personal history of other medical treatment: Secondary | ICD-10-CM | POA: Diagnosis not present

## 2024-01-05 DIAGNOSIS — Z0000 Encounter for general adult medical examination without abnormal findings: Secondary | ICD-10-CM | POA: Diagnosis not present

## 2024-01-05 DIAGNOSIS — G20A1 Parkinson's disease without dyskinesia, without mention of fluctuations: Secondary | ICD-10-CM

## 2024-01-05 DIAGNOSIS — Z5941 Food insecurity: Secondary | ICD-10-CM | POA: Diagnosis not present

## 2024-01-05 DIAGNOSIS — F411 Generalized anxiety disorder: Secondary | ICD-10-CM

## 2024-01-05 DIAGNOSIS — H9193 Unspecified hearing loss, bilateral: Secondary | ICD-10-CM

## 2024-01-05 NOTE — Progress Notes (Signed)
 Subjective:   Shawn Meza is a 78 y.o. who presents for a Medicare Wellness preventive visit.  As a reminder, Annual Wellness Visits don't include a physical exam, and some assessments may be limited, especially if this visit is performed virtually. We may recommend an in-person follow-up visit with your provider if needed.  Visit Complete: Virtual I connected with  [name] on 01/05/24 by a audio enabled telemedicine application and verified that I am speaking with the correct person using two identifiers.  Patient Location: Home  Provider Location: Office/Clinic  I discussed the limitations of evaluation and management by telemedicine. The patient expressed understanding and agreed to proceed.  Vital Signs: Because this visit was a virtual/telehealth visit, some criteria may be missing or patient reported. Any vitals not documented were not able to be obtained and vitals that have been documented are patient reported.  VideoDeclined- This patient declined Librarian, academic. Therefore the visit was completed with audio only.  Persons Participating in Visit: Patient.  AWV Questionnaire: No: Patient Medicare AWV questionnaire was not completed prior to this visit.  Cardiac Risk Factors include: hypertension;dyslipidemia;male gender     Objective:     Today's Vitals   01/05/24 1356  Weight: 205 lb (93 kg)  Height: 5\' 6"  (1.676 m)   Body mass index is 33.09 kg/m.     01/05/2024    2:26 PM 11/06/2023    4:26 PM 11/06/2023    3:41 PM 10/20/2023    3:20 PM 04/20/2023    2:51 PM 02/14/2023   12:29 PM 10/18/2022    2:41 PM  Advanced Directives  Does Patient Have a Medical Advance Directive? No Yes Yes No No Yes No  Type of Special educational needs teacher of [place];Living will Healthcare Power of [place];Living will   Healthcare Power of [place];Living will   Does patient want to make changes to medical advance directive?      No - Patient  declined   Would patient like information on creating a medical advance directive? No - Patient declined          Current Medications (verified) Outpatient Encounter Medications as of 01/05/2024  Medication Sig   acetaminophen  (TYLENOL ) 325 MG tablet Take 162.5 mg by mouth every 6 (six) hours as needed for mild pain or moderate pain.   aspirin  81 MG EC tablet Take 1 tablet (81 mg total) by mouth daily.   azelastine  (ASTELIN ) 0.1 % nasal spray Place 2 sprays into both nostrils 2 (two) times daily. Use in each nostril as directed   beclomethasone (QVAR  REDIHALER) 40 MCG/ACT inhaler Inhale 2 puffs into the lungs 2 (two) times daily.   Carbidopa -Levodopa  ER (RYTARY ) 61.25-245 MG CPCR 1 four times per day, the last at bedtime   citalopram  (CELEXA ) 20 MG tablet Take 1 tablet (20 mg total) by mouth daily.   empagliflozin  (JARDIANCE ) 10 MG TABS tablet Take 10 mg by mouth daily.   esomeprazole  (NEXIUM ) 40 MG capsule Take 1 capsule (40 mg total) by mouth daily.   fluticasone  (FLONASE ) 50 MCG/ACT nasal spray Place 2 sprays into both nostrils daily.   furosemide  (LASIX ) 20 MG tablet Take 1 tablet (20 mg total) by mouth as needed for fluid or edema (shortness of breath).   gabapentin  (NEURONTIN ) 100 MG capsule Take 1 capsule (100 mg total) by mouth at bedtime.   levocetirizine (XYZAL ) 5 MG tablet Take 1 tablet (5 mg total) by mouth every evening. For allergies  metoprolol  tartrate (LOPRESSOR ) 25 MG tablet Take 1 tablet (25 mg total) by mouth daily.   montelukast  (SINGULAIR ) 10 MG tablet Take 1 tablet (10 mg total) by mouth at bedtime.   nitroGLYCERIN  (NITROSTAT ) 0.4 MG SL tablet Place 1 tablet (0.4 mg total) under the tongue every 5 (five) minutes x 3 doses as needed for chest pain. If chest pain is not relieved after 2nd dose - call 911.   sacubitril -valsartan  (ENTRESTO ) 49-51 MG TAKE 1 TABLET BY MOUTH 2 TIMES DAILY.   pravastatin  (PRAVACHOL ) 80 MG tablet Take 1 tablet (80 mg total) by mouth every evening.    No facility-administered encounter medications on file as of 01/05/2024.    Allergies (verified) Tizanidine  hcl, Fluoxetine , Rosuvastatin , Testosterone, Ropinirole  hcl, and Requip  [ropinirole ]   History: Past Medical History:  Diagnosis Date   Abscess of right axilla 12/12/2017   Acute blood loss anemia 10/25/2017   Acute postoperative respiratory insufficiency 10/25/2017   Aortic regurgitation 09/16/2018   Arthritis    BMI 33.0-33.9,adult 12/12/2017   Cardiomyopathy, unspecified (HCC) 06/13/2018   Chest pain in adult 10/11/2017   Chronic coronary artery disease    Chronic systolic (congestive) heart failure (HCC) 09/18/2018   Colon cancer (HCC)    Coronary artery disease involving native coronary artery of native heart with angina pectoris (HCC) 12/28/2010   Added automatically from request for surgery [id number]     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        GERD (gastroesophageal reflux disease)    Hiatal hernia    History of cardiovascular disorder 05/20/2010   Qualifier: Diagnosis of   By: Rochelle Chu MD, Randa Burton.     IMO SNOMED Dx Update Oct 2024     Hypercholesterolemia    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/20/2010   Qualifier: Diagnosis of   By: Rochelle Chu MD, Randa Burton.        Ischemic cardiomyopathy 09/16/2018   LBBB (left bundle branch block)    Long term current use of aspirin  03/29/2017   Long-term use of aspirin  therapy    Low back pain 10/28/2019   Lumbar spondylosis 06/29/2022   Major depression, chronic    Major depressive disorder, recurrent episode, moderate (HCC) 06/30/2022   Medial meniscus tear 10/11/2011    Memory change 07/06/2015   Metabolic syndrome    Mixed hyperlipidemia 05/20/2010   Qualifier: Diagnosis of   By: Rochelle Chu MD, Randa Burton.        Myofascial pain 12/20/2019   Nephrolithiasis    hx of   NEPHROLITHIASIS, HX OF 05/20/2010   Qualifier: Diagnosis of   By: Rochelle Chu MD, Randa Burton.        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   SOB (shortness of breath) 03/31/2017   Transient ischemic attack    hx of   Trochanteric bursitis of right hip  Past Surgical History:  Procedure Laterality Date   CARDIAC CATHETERIZATION  5/12,1/13   4 stents placed   COLON SURGERY     CORONARY ARTERY BYPASS GRAFT     KNEE ARTHROSCOPY  10/11/2011   Procedure: ARTHROSCOPY KNEE;  Surgeon: Genevie Kerns, MD;  Location: [facility];  Service: Orthopedics;  Laterality: Left;  Left Knee Arthroscopy with Medial and Lateral Partial Menisectomy, Chondroplasty   LEFT HEART CATHETERIZATION WITH CORONARY ANGIOGRAM N/A 08/25/2011   Procedure: LEFT HEART CATHETERIZATION WITH CORONARY ANGIOGRAM;  Surgeon: Odie Benne, MD;  Location: [facility] CATH LAB;  Service: Cardiovascular;  Laterality: N/A;   LITHOTRIPSY     STERIOD INJECTION  10/11/2011   Procedure: STEROID INJECTION;  Surgeon: Genevie Kerns, MD;  Location: [facility];  Service: Orthopedics;  Laterality: Right;  Steroid Injection Second Toe   TRANSURETHRAL RESECTION OF PROSTATE     URETHRAL DILATION     Family History  Problem Relation Age of Onset   Heart disease Mother    Heart disease Father    Hyperlipidemia Father    Stroke Father    Hyperlipidemia Brother    Heart disease Brother    Coronary artery disease Other        family hx of male 1st degree relative  ,24   Hyperlipidemia Other        family hx of   Hypertension Other        family hx of   Arthritis Other        family hx of   Healthy Daughter    Dementia Neg Hx    Social History   Socioeconomic History   Marital status: Divorced    Spouse name: Not on file   Number of children: 1   Years of education: 12   Highest education level: High school graduate  Occupational History   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  Tobacco Use   Smoking status: Never    Passive exposure: Never   Smokeless tobacco: Never  Vaping Use   Vaping status: Never Used  Substance and Sexual Activity   Alcohol use: No   Drug use: No   Sexual activity: Not Currently  Other Topics Concern   Not on file  Social History Narrative   Divorced 2016   Caffeine use: none       Social Drivers of Health   Financial Resource Strain: High Risk (01/05/2024)   Overall Financial Resource Strain (CARDIA)    Difficulty of Paying Living Expenses: Hard  Food Insecurity: Food Insecurity Present (01/05/2024)   Hunger Vital Sign    Worried About Running Out of Food in the Last Year: Often true    Ran Out of Food in the Last Year: Often true  Transportation Needs: No Transportation Needs (02/06/2023)   PRAPARE - Administrator, Civil Service (Medical): No    Lack of Transportation (Non-Medical): No  Physical Activity: Insufficiently Active (01/05/2024)   Exercise Vital Sign    Days of Exercise per Week: 7 days    Minutes of Exercise per Session: 10 min  Stress: Stress Concern Present (01/05/2024)   [employer] of Occupational Health - Occupational Stress Questionnaire    Feeling of Stress : Very much  Social Connections: Socially Isolated (01/05/2024)   Social Connection and Isolation Panel [NHANES]    Frequency of Communication with Friends and Family: Once a week    Frequency of Social Gatherings with Friends and  Family: Never    Attends Religious Services: 1 to 4 times per year     Active Member of Clubs or Organizations: No    Attends Banker Meetings: Never    Marital Status: Separated    Tobacco Counseling Counseling given: Not Answered    Clinical Intake:  Pre-visit preparation completed: Yes  Pain : No/denies pain     BMI - recorded: 33.09 Nutritional Risks: None Diabetes: No  Lab Results  Component Value Date   HGBA1C 5.8 11/28/2022   HGBA1C 5.7 (H) 12/19/2018   HGBA1C 6.1 (H) 05/20/2015     What is the last grade level you completed in school?: 12  Interpreter Needed?: No  Information entered by :: Susa Engman, CMA   Activities of Daily Living     01/05/2024    2:08 PM  In your present state of health, do you have any difficulty performing the following activities:  Hearing? 1  Comment Had hearing aids in past. having difficulty finding somewhere that accepts his insurance.  Vision? 0  Comment Had eye exam 3-4 months with DrPhillip Bell in [place]  Difficulty concentrating or making decisions? 0  Walking or climbing stairs? 0  Dressing or bathing? 0  Doing errands, shopping? 0  Preparing Food and eating ? N  Using the Toilet? N  In the past six months, have you accidently leaked urine? Y  Comment can't hold urine  Do you have problems with loss of bowel control? N  Managing your Medications? N  Comment pt states parkinson's makes daily tasks difficult  Managing your Finances? N  Housekeeping or managing your Housekeeping? N    Patient Care Team: Jobe Mulder, DO as PCP - General (Family Medicine) Sandee Crook Margart Shears, MD as PCP - Cardiology (Cardiology) Candyce Champagne, MD (General Surgery) Elzie Handler, MD as Consulting Physician (General Surgery) [name], Von Grumbling, DO as Consulting Physician (Neurology) Cecilie Coffee, RPH-CPP (Pharmacist) Bary Boss., MD (Ophthalmology)  I have updated your Care Teams any recent Medical Services you may have received from other providers in the past  year.     Assessment:    This is a routine wellness examination for Dontee.  Hearing/Vision screen Hearing Screening - Comments:: Had hearing aids previously. Has difficulty finding someone to accept his insurance. Needs a new pair Vision Screening - Comments:: Last eye exam 2-3 months ago with Shann Darnel   Goals Addressed   None    Depression Screen     01/05/2024    2:17 PM 11/28/2022    1:27 PM 03/24/2022    3:04 PM 03/01/2022    3:38 PM 09/30/2021    3:25 PM 09/23/2021    1:22 PM 12/17/2018    1:41 PM  PHQ 2/9 Scores  PHQ - 2 Score 6 2 4 6 6 6 6   PHQ- 9 Score 15 5 15 15 17 18 15     Fall Risk     01/05/2024    2:05 PM 04/20/2023    2:51 PM 11/28/2022    1:27 PM 10/18/2022    2:41 PM 08/31/2022    3:08 PM  Fall Risk   Falls in the past year? 0 0 0 0 0  Number falls in past yr: 0  0 0 0  Injury with Fall? 0 0 0 0 0  Risk for fall due to : Impaired balance/gait  No Fall Risks    Risk for fall due to: Comment has parkinsons  Follow up Education provided Falls evaluation completed Falls evaluation completed Falls evaluation completed     MEDICARE RISK AT HOME:  Medicare Risk at Home Any stairs in or around the home?: Yes If so, are there any without handrails?: No Home free of loose throw rugs in walkways, pet beds, electrical cords, etc?: Yes Adequate lighting in your home to reduce risk of falls?: Yes Life alert?: No Use of a cane, walker or w/c?: Yes (cane occasionally) Grab bars in the bathroom?: Yes Shower chair or bench in shower?: No Elevated toilet seat or a handicapped toilet?: Yes  TIMED UP AND GO:  Was the test performed?  No, audio   Cognitive Function: 6CIT completed      07/06/2015    1:23 PM  Montreal Cognitive Assessment   Visuospatial/ Executive (0/5) 5  Naming (0/3) 2  Attention: Read list of digits (0/2) 2  Attention: Read list of letters (0/1) 1  Attention: Serial 7 subtraction starting at 100 (0/3) 2  Language: Repeat phrase (0/2) 1   Language : Fluency (0/1) 0  Abstraction (0/2) 1  Delayed Recall (0/5) 3  Orientation (0/6) 6  Total 23  Adjusted Score (based on education) 24      01/05/2024    2:31 PM 03/24/2022    3:15 PM  6CIT Screen  What Year? 0 points 0 points  What month?  0 points  What time? 0 points 0 points  Count back from 20 0 points 0 points  Months in reverse 2 points 4 points  Repeat phrase 0 points 0 points  Total Score  4 points    Immunizations Immunization History  Administered Date(s) Administered   Fluad Quad(high Dose 65+) 05/06/2019, 04/29/2022   Influenza Split 04/18/2012   Influenza Whole 05/20/2010   Influenza,inj,Quad PF,6+ Mos 06/13/2013, 05/20/2015   Influenza-Unspecified 05/31/2015, 06/29/2021   PFIZER(Purple Top)SARS-COV-2 Vaccination 09/27/2019, 10/22/2019   Pneumococcal Conjugate-13 05/20/2015   Pneumococcal Polysaccharide-23 05/20/2010   Td 08/02/2007    Screening Tests Health Maintenance  Topic Date Due   Medicare Annual Wellness (AWV)  03/25/2023   DTaP/Tdap/Td (2 - Tdap) 01/04/2025 (Originally 08/01/2017)   Zoster Vaccines- Shingrix (1 of 2) 01/04/2025 (Originally 03/29/1965)   INFLUENZA VACCINE  03/01/2024   Pneumonia Vaccine 52+ Years old  Completed   Hepatitis C Screening  Completed   HPV VACCINES  Aged Out   Meningococcal B Vaccine  Aged Out   Colonoscopy  Discontinued   COVID-19 Vaccine  Discontinued    Health Maintenance  Health Maintenance Due  Topic Date Due   Medicare Annual Wellness (AWV)  03/25/2023   Health Maintenance Items Addressed: Pt advised to get tetanus and COVID boosters at local pharmacy. All other HM up to date.  Additional Screening:  Vision Screening: Recommended annual ophthalmology exams for early detection of glaucoma and other disorders of the eye. Would you like a referral to an eye doctor? No    Dental Screening: Recommended annual dental exams for proper oral hygiene  Community Resource Referral / Chronic Care  Management: CRR required this visit?  Yes   CCM required this visit?  No   Plan:    I have personally reviewed and noted the following in the patient's chart:   Medical and social history Use of alcohol, tobacco or illicit drugs  Current medications and supplements including opioid prescriptions. Patient is not currently taking opioid prescriptions. Functional ability and status Nutritional status Physical activity Advanced directives List of other physicians Hospitalizations, surgeries, and ER  visits in previous 12 months Vitals Screenings to include cognitive, depression, and falls Referrals and appointments  In addition, I have reviewed and discussed with patient certain preventive protocols, quality metrics, and best practice recommendations. A written personalized care plan for preventive services as well as general preventive health recommendations were provided to patient.   Susa Engman, CMA   01/05/2024   After Visit Summary: (MyChart) Due to this being a telephonic visit, the after visit summary with patients personalized plan was offered to patient via MyChart   Notes: Please refer to Routing Comments.

## 2024-01-05 NOTE — Patient Instructions (Signed)
 Shawn Meza , Thank you for taking time out of your busy schedule to complete your Annual Wellness Visit with me. I enjoyed our conversation and look forward to speaking with you again next year. I, as well as your care team,  appreciate your ongoing commitment to your health goals. Please review the following plan we discussed and let me know if I can assist you in the future. Your Game plan/ To Do List    I have placed a referral for an audiology consult to replace your previous hearing aids. I sent your refill request on Celexa  to Dr Gwenette Lennox for approval.   Follow up Visits: Next Medicare AWV with our clinical staff: 01/09/25 2:20pm   Next Office Visit with your provider: 01/12/24 3pm  Clinician Recommendations:  Aim for 30 minutes of exercise or brisk walking, 6-8 glasses of water, and 5 servings of fruits and vegetables each day.       This is a list of the screening recommended for you and due dates:  Health Maintenance  Topic Date Due   DTaP/Tdap/Td vaccine (2 - Tdap) 01/04/2025*   Zoster (Shingles) Vaccine (1 of 2) 01/04/2025*   Flu Shot  03/01/2024   Medicare Annual Wellness Visit  01/04/2025   Pneumonia Vaccine  Completed   Hepatitis C Screening  Completed   HPV Vaccine  Aged Out   Meningitis B Vaccine  Aged Out   Colon Cancer Screening  Discontinued   COVID-19 Vaccine  Discontinued  *Topic was postponed. The date shown is not the original due date.    Advanced directives: (Copy Requested) Please bring a copy of your health care power of attorney and living will to the office to be added to your chart at your convenience. You can mail to [facility] [address] [place], [place] [address] or email to [email or web address] Advance Care Planning is important because it:  [x]  Makes sure you receive the medical care that is consistent with your values, goals, and preferences  [x]  It provides guidance to your family and loved ones and reduces their  decisional burden about whether or not they are making the right decisions based on your wishes.  Follow the link provided in your after visit summary or read over the paperwork we have mailed to you to help you started getting your Advance Directives in place. If you need assistance in completing these, please reach out to us  so that we can help you!  See attachments for Fall Prevention Tips.

## 2024-01-08 ENCOUNTER — Encounter

## 2024-01-11 ENCOUNTER — Encounter

## 2024-01-12 ENCOUNTER — Encounter: Payer: Self-pay | Admitting: Family Medicine

## 2024-01-12 ENCOUNTER — Other Ambulatory Visit: Payer: Self-pay | Admitting: Family Medicine

## 2024-01-12 ENCOUNTER — Ambulatory Visit (INDEPENDENT_AMBULATORY_CARE_PROVIDER_SITE_OTHER): Admitting: Family Medicine

## 2024-01-12 VITALS — BP 116/74 | HR 85 | Temp 97.8°F | Resp 16 | Ht 66.0 in | Wt 209.0 lb

## 2024-01-12 DIAGNOSIS — R3915 Urgency of urination: Secondary | ICD-10-CM

## 2024-01-12 DIAGNOSIS — C44329 Squamous cell carcinoma of skin of other parts of face: Secondary | ICD-10-CM | POA: Diagnosis not present

## 2024-01-12 DIAGNOSIS — R5383 Other fatigue: Secondary | ICD-10-CM

## 2024-01-12 DIAGNOSIS — D489 Neoplasm of uncertain behavior, unspecified: Secondary | ICD-10-CM

## 2024-01-12 NOTE — Patient Instructions (Addendum)
 Give us  2-3 business days to get the results of your labs back.   Keep the diet clean and stay active.  Do not shower for the rest of the day. When you do wash it, use only soap and water. Do not vigorously scrub. Apply triple antibiotic ointment (like Neosporin) twice daily. Keep the area clean and dry.   Things to look out for: increasing pain not relieved by ibuprofen/acetaminophen , fevers, spreading redness, drainage of pus, or foul odor.  Give us  1 week to get the results of your biopsy back.  Consider taking half a tab of extra strength Tylenol  (500 mg tabs) before bed to see if it helps.  Let us  know if you need anything.

## 2024-01-12 NOTE — Progress Notes (Signed)
 Chief Complaint  Patient presents with   Restless Leg    Restless Leg    Shawn Meza is a 78 y.o. male here for possible UTI.  Duration: 6 months. Symptoms: Dysuria, urinary frequency, flank pain on right, urinary incontinence, and urgency Denies: hematuria, urinary hesitancy, urinary retention, fever, nausea, and vomiting,  discharge Hx of recurrent UTI? Yes  Fatigue for the past 6-8 mo. Diet is fair. He does walk relatively routinely. Sleeps around 1 hr per night. Mood is grumpy compared to the past. He is seeing a therapist. Damaris Duhamel if he snores at night. Has never had a sleep study. Does not wake up with headaches. Takes a full Tylenol  and has trouble waking up the next day. Takes 1/2 a Tylenol  and takes him 5 hrs to fall asleep.   Patient has a lesion on the right side of his face.  It was growing.  It does drain fluid.  There is no pain, skin color change, or itching.  He denies any trauma.  He has an appointment with the dermatology team but is not for another 6 months.  Past Medical History:  Diagnosis Date   Abscess of right axilla 12/12/2017   Acute blood loss anemia 10/25/2017   Acute postoperative respiratory insufficiency 10/25/2017   Aortic regurgitation 09/16/2018   Arthritis    BMI 33.0-33.9,adult 12/12/2017   Cardiomyopathy, unspecified (HCC) 06/13/2018   Chest pain in adult 10/11/2017   Chronic coronary artery disease    Chronic systolic (congestive) heart failure (HCC) 09/18/2018   Colon cancer (HCC)    Coronary artery disease involving native coronary artery of native heart with angina pectoris (HCC) 12/28/2010   Added automatically from request for surgery [id number]     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        GERD (gastroesophageal reflux disease)    Hiatal hernia    History of cardiovascular disorder 05/20/2010   Qualifier: Diagnosis of   By: Rochelle Chu MD, Randa Burton.     IMO SNOMED Dx Update Oct 2024     Hypercholesterolemia    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/20/2010   Qualifier: Diagnosis of   By: Rochelle Chu MD, Randa Burton.        Ischemic cardiomyopathy 09/16/2018   LBBB (left bundle branch block)    Long term current use of aspirin  03/29/2017   Long-term use of aspirin  therapy    Low back pain 10/28/2019   Lumbar spondylosis 06/29/2022   Major depression, chronic    Major depressive disorder, recurrent episode, moderate (HCC) 06/30/2022   Medial meniscus tear 10/11/2011   Memory change 07/06/2015   Metabolic syndrome    Mixed hyperlipidemia 05/20/2010   Qualifier: Diagnosis of   By: Rochelle Chu MD, Randa Burton.        Myofascial pain 12/20/2019   Nephrolithiasis    hx of   NEPHROLITHIASIS, HX OF 05/20/2010   Qualifier: Diagnosis of   By: Rochelle Chu MD, Randa Burton.        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 There is a central excoriation. No pain, ttp, erythema, fluctuance, drainage.  MSK: No CVA tenderness, neg Lloyd's sign Psych: Age appropriate judgment and insight  Procedure note; shave biopsy Informed consent was obtained. Diagnostic and therapeutic The area was cleaned with alcohol and injected with 0.7 mL of 1% lidocaine  with epinephrine . A Dermablade was slightly bent and used to cut under the area of interest. The specimen was placed in a sterile specimen cup and sent to the lab. The area was then cauterized ensuring adequate hemostasis. The area was dressed with triple antibiotic ointment and a bandage. There were no complications noted. The patient tolerated the procedure well.  Urinary urgency - Plan: Urine Culture, Urinalysis  Fatigue, unspecified type - Plan: TSH, Comprehensive metabolic panel with GFR, CBC  Neoplasm of uncertain behavior  Ck urine. Reports recurrent kidney infections though that seems unlikely he's had one for 6 mo without bacteremia. Stay hydrated. Seek immediate care if pt starts to develop fevers, new/worsening symptoms, uncontrollable N/V. Ck labs. Could be related to lack of sleep. Rec'd 250 mg Tylenol  qhs has 325 is too much and 162 is too little. Cont w counseling. Consider mood stabilizer also.  Would consider following up in the next few weeks if not significantly improved with this. Shave biopsy today.  Worried about squamous cell carcinoma.  Derm cannot get in for 6 more mo. Aftercare instructions verbalized and written down.  F/u prn. The patient voiced understanding and agreement to the plan.  I spent 33 minutes with the patient discussing the above plans in addition to reviewing his chart on the same day of the visit, this time is exclusive of time for the procedure.  Shellie Dials [place], DO 01/12/24 3:33 PM

## 2024-01-12 NOTE — Addendum Note (Signed)
 Addended by: Arvilla Salada M on: 01/12/2024 04:01 PM   Modules accepted: Orders

## 2024-01-13 ENCOUNTER — Ambulatory Visit: Payer: Self-pay | Admitting: Family Medicine

## 2024-01-13 LAB — COMPREHENSIVE METABOLIC PANEL WITH GFR
AG Ratio: 1.8 (calc) (ref 1.0–2.5)
ALT: 12 U/L (ref 9–46)
AST: 19 U/L (ref 10–35)
Albumin: 4.4 g/dL (ref 3.6–5.1)
Alkaline phosphatase (APISO): 62 U/L (ref 35–144)
BUN: 18 mg/dL (ref 7–25)
CO2: 23 mmol/L (ref 20–32)
Calcium: 9.1 mg/dL (ref 8.6–10.3)
Chloride: 105 mmol/L (ref 98–110)
Creat: 0.9 mg/dL (ref 0.70–1.28)
Globulin: 2.5 g/dL (ref 1.9–3.7)
Glucose, Bld: 101 mg/dL — ABNORMAL HIGH (ref 65–99)
Potassium: 4.5 mmol/L (ref 3.5–5.3)
Sodium: 141 mmol/L (ref 135–146)
Total Bilirubin: 0.5 mg/dL (ref 0.2–1.2)
Total Protein: 6.9 g/dL (ref 6.1–8.1)
eGFR: 87 mL/min/{1.73_m2} (ref >=60)

## 2024-01-13 LAB — URINALYSIS
Bilirubin Urine: NEGATIVE
Glucose, UA: NEGATIVE
Hgb urine dipstick: NEGATIVE
Ketones, ur: NEGATIVE
Leukocytes,Ua: NEGATIVE
Nitrite: NEGATIVE
Protein, ur: NEGATIVE
Specific Gravity, Urine: 1.014 (ref 1.001–1.035)
pH: <=5 — AB (ref 5.0–8.0)

## 2024-01-13 LAB — CBC
HCT: 47.2 % (ref 38.5–50.0)
Hemoglobin: 16.1 g/dL (ref 13.2–17.1)
MCH: 32.4 pg (ref 27.0–33.0)
MCHC: 34.1 g/dL (ref 32.0–36.0)
MCV: 95 fL (ref 80.0–100.0)
MPV: 9.9 fL (ref 7.5–12.5)
Platelets: 195 10*3/uL (ref 140–400)
RBC: 4.97 10*6/uL (ref 4.20–5.80)
RDW: 12.6 % (ref 11.0–15.0)
WBC: 9.3 10*3/uL (ref 3.8–10.8)

## 2024-01-13 LAB — URINE CULTURE
MICRO NUMBER:: 16578127
SPECIMEN QUALITY:: ADEQUATE

## 2024-01-13 LAB — TSH: TSH: 4.19 m[IU]/L (ref 0.40–4.50)

## 2024-01-17 ENCOUNTER — Ambulatory Visit (INDEPENDENT_AMBULATORY_CARE_PROVIDER_SITE_OTHER): Admitting: Psychology

## 2024-01-17 ENCOUNTER — Ambulatory Visit: Payer: Self-pay | Admitting: Family Medicine

## 2024-01-17 DIAGNOSIS — F419 Anxiety disorder, unspecified: Secondary | ICD-10-CM

## 2024-01-17 DIAGNOSIS — F329 Major depressive disorder, single episode, unspecified: Secondary | ICD-10-CM | POA: Diagnosis not present

## 2024-01-17 DIAGNOSIS — F331 Major depressive disorder, recurrent, moderate: Secondary | ICD-10-CM

## 2024-01-17 LAB — DERMATOLOGY PATHOLOGY
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
## 2024-01-19 ENCOUNTER — Telehealth: Payer: Self-pay

## 2024-01-22 ENCOUNTER — Telehealth: Payer: Self-pay

## 2024-01-22 NOTE — Telephone Encounter (Signed)
 done

## 2024-01-22 NOTE — Telephone Encounter (Signed)
 [facility] back Lvm letting them know, per Dr.Wendling we don't have a picture of area that's was biopsy.

## 2024-01-22 NOTE — Progress Notes (Signed)
 Complex Care Management Note  Care Guide Note 01/22/2024 Name: Shawn Meza MRN: [id number] DOB: [birth year]  Shawn Meza is a 78 y.o. year old male who sees Frann, Mabel Mt, DO for primary care. I reached out to [name] by phone today to offer complex care management services.  [name] was given information about Complex Care Management services today including:   The Complex Care Management services include support from the care team which includes your Nurse Care Manager, Clinical Social Worker, or Pharmacist.  The Complex Care Management team is here to help remove barriers to the health concerns and goals most important to you. Complex Care Management services are voluntary, and the patient may decline or stop services at any time by request to their care team member.   Complex Care Management Consent Status: Patient agreed to services and verbal consent obtained.   Follow up plan:  Telephone appointment with complex care management team member scheduled for:  01/26/24 & 02/12/24.   Encounter Outcome:  Patient Scheduled  Dreama [facility]  [facility] Health Care Management Assistant Direct Dial: [phone]  Fax: [phone]

## 2024-01-22 NOTE — Telephone Encounter (Unsigned)
 Copied from CRM [id number]. Topic: General - Other >> Jan 22, 2024  1:10 PM Drema MATSU wrote: Reason for CRM: Hadassah with [facility] is calling in regards to the pathology report they received. She states that the doctor is requesting pictures where the biopsy was performed.

## 2024-01-26 ENCOUNTER — Other Ambulatory Visit: Payer: Self-pay | Admitting: Licensed Clinical Social Worker

## 2024-01-31 ENCOUNTER — Ambulatory Visit: Admitting: Psychology

## 2024-02-05 NOTE — Progress Notes (Signed)
 " Cardiology Office Note:  .   Date:  02/06/2024  ID:  Shawn Meza, DOB 1946-05-16, MRN [id number] PCP: Frann Mabel Mt, DO  [facility] Providers Cardiologist:  Lamar Fitch, MD    History of Present Illness: .   Shawn Meza is a 78 y.o. male with a past medical history of HFrEF, CAD s/p CABG x 4 2019, hypertension, LBBB, history of TIA, GERD, history of colon cancer, essential tremor, Parkinson's disease, autonomic dysfunction, dyslipidemia, erectile dysfunction, BPH.  02/24/2023 echo EF 30%, global hypokinesis, mild aortic valve sclerosis 04/13/2022 echo EF 20 to 25%, aortic valve calcified with mild regurgitation and stenosis 01/22/2019 echo EF 35 to 40%, moderate hypokinesis of the left ventricular anteroseptal wall and anterior wall, mild aortic valve regurgitation 07/31/2018 Lexiscan  no ischemia, intermediate risk secondary to decreased EF 06/04/2018 echo EF 35 to 40%, moderate hypokinesis of the anteroseptal and anterior myocardium 04/13/2018 carotid duplex minimal calcified plaque 10/22/2017 CABG  x 4  He underwent CABG x 4 in 2019. Echo in 2019 revealed HFrEF. Evaluated by Dr. Fitch on 01/26/2023, he was stable from a cardiac perspective.  Previous discussions were had surrounding biventricular pacing however he refused this.  He was participating with PT for his Parkinson's disease and repeat echo was arranged secondary to heaviness when he laid supine which revealed EF of 30%, global hypokinesis, mild aortic valve sclerosis--his Entresto  was increased and he was advised to follow-up in 6 months.  Evaluated by myself on 09/28/2023 for follow up if his heart failure and CAD, bothered by fatigue, anxiety and generalized malaise. Started on jardiance  with plans to follow up in 3 months.   He presents today for follow-up of his heart failure.  We previously started him on Jardiance  however he stated this made him feel bad and he subsequently stopped.  He does not have  any formal complaints from a heart failure perspective.  He is plagued by fatigue, he is also very bothered by his Parkinson's disease, dealing with a lot of anxiety/depression and almost self-isolation.  He does have a daughter close by however she has medical issues of her own and cannot help help him very much.  He was previously involved with his church however does not regularly attend anymore.  He denies HI/SI but does feel his depression is not well managed. He denies chest pain, palpitations, dyspnea, pnd, orthopnea, n, v, dizziness, syncope, edema, weight gain, or early satiety.   ROS: Review of Systems  Constitutional:  Positive for malaise/fatigue.  Neurological:  Positive for tremors.  Psychiatric/Behavioral:  Positive for depression. Negative for suicidal ideas.      Studies Reviewed: .        Cardiac Studies & Procedures   ______________________________________________________________________________________________   STRESS TESTS  MYOCARDIAL PERFUSION IMAGING 07/31/2018  Interpretation Summary  Nuclear stress EF: 27%.  Blood pressure demonstrated a normal response to exercise.  Defect 1: There is a medium defect of severe severity present in the basal inferoseptal, basal inferior, mid inferoseptal, mid inferior and apical inferior location.  Findings consistent with prior myocardial infarction.  This is an intermediate risk study.  The left ventricular ejection fraction is severely decreased (<30%).  Cardiomegaly with global hypokinesis. Akinesis of the inferior and infero - septal wall. Diminished LVEF - 27%. No evidence of ischemia.   ECHOCARDIOGRAM  ECHOCARDIOGRAM COMPLETE 02/24/2023  Narrative ECHOCARDIOGRAM REPORT    Patient Name:   Shawn Meza Date of Exam: 02/24/2023 Medical Rec #:  [id number]  Height:       67.0 in Accession #:    [id number]     Weight:       203.6 lb Date of Birth:  1946/01/10      BSA:          2.038 m Patient Age:    43  years       BP:           128/72 mmHg Patient Gender: M              HR:           92 bpm. Exam Location:  [facility]  Procedure: 2D Echo, Cardiac Doppler, Color Doppler and Intracardiac Opacification Agent  Indications:    Coronary artery disease involving native coronary artery of native heart with angina pectoris (HCC) [I25.119 (ICD-10-CM)]; Ischemic cardiomyopathy [I25.5 (ICD-10-CM)]  History:        Patient has prior history of Echocardiogram examinations, most recent 04/13/2022. Cardiomyopathy and CHF, CAD; Prior CABG. Parkinson's disease.  Sonographer:    Charlie Jointer RDCS Referring Phys: [id number] LAMAR JINNY FITCH   Sonographer Comments: Technically difficult study due to poor echo windows. IMPRESSIONS   1. Left ventricular ejection fraction, by estimation, is 30%%. The left ventricle has moderately decreased function. The left ventricle demonstrates global hypokinesis. Left ventricular diastolic parameters are indeterminate. 2. Right ventricular systolic function is moderately reduced. The right ventricular size is normal. 3. The mitral valve is degenerative. No evidence of mitral valve regurgitation. No evidence of mitral stenosis. 4. The aortic valve is tricuspid. Aortic valve regurgitation is mild. Aortic valve sclerosis is present, with no evidence of aortic valve stenosis. 5. Aortic DTA is NWV.  FINDINGS Left Ventricle: Left ventricular ejection fraction, by estimation, is 30%%. The left ventricle has moderately decreased function. The left ventricle demonstrates global hypokinesis. Definity  contrast agent was given IV to delineate the left ventricular endocardial borders. The left ventricular internal cavity size was normal in size. There is no left ventricular hypertrophy. Left ventricular diastolic parameters are indeterminate.  Right Ventricle: The right ventricular size is normal. No increase in right ventricular wall thickness. Right ventricular systolic function  is moderately reduced.  Left Atrium: Left atrial size was normal in size.  Right Atrium: Right atrial size was normal in size.  Pericardium: There is no evidence of pericardial effusion.  Mitral Valve: The mitral valve is degenerative in appearance. Mild mitral annular calcification. No evidence of mitral valve regurgitation. No evidence of mitral valve stenosis.  Tricuspid Valve: The tricuspid valve is normal in structure. Tricuspid valve regurgitation is not demonstrated. No evidence of tricuspid stenosis.  Aortic Valve: The aortic valve is tricuspid. Aortic valve regurgitation is mild. Aortic valve sclerosis is present, with no evidence of aortic valve stenosis.  Pulmonic Valve: The pulmonic valve was normal in structure. Pulmonic valve regurgitation is not visualized. No evidence of pulmonic stenosis.  Aorta: The aortic arch was not well visualized, the aortic root and ascending aorta are structurally normal, with no evidence of dilitation and DTA is NWV.  Venous: The pulmonary veins were not well visualized. The inferior vena cava was not well visualized.  IAS/Shunts: No atrial level shunt detected by color flow Doppler.   LEFT VENTRICLE PLAX 2D LVIDd:         5.50 cm   Diastology LVIDs:         4.55 cm   LV e' medial:    8.16 cm/s LV PW:  1.15 cm   LV E/e' medial:  15.3 LV IVS:        1.20 cm   LV e' lateral:   8.59 cm/s LVOT diam:     2.20 cm   LV E/e' lateral: 14.6 LV SV:         55 LV SV Index:   27 LVOT Area:     3.80 cm   RIGHT VENTRICLE RV Basal diam:  2.20 cm RV Mid diam:    1.70 cm RV S prime:     8.05 cm/s TAPSE (M-mode): 1.2 cm  LEFT ATRIUM             Index        RIGHT ATRIUM          Index LA diam:        4.20 cm 2.06 cm/m   RA Area:     9.25 cm LA Vol (A2C):   38.0 ml 18.65 ml/m  RA Volume:   16.70 ml 8.20 ml/m LA Vol (A4C):   33.2 ml 16.29 ml/m LA Biplane Vol: 38.0 ml 18.65 ml/m AORTIC VALVE LVOT Vmax:   83.80 cm/s LVOT Vmean:  55.567  cm/s LVOT VTI:    0.146 m  AORTA Ao Root diam: 3.60 cm  MITRAL VALVE MV Area (PHT): 4.29 cm     SHUNTS MV Decel Time: 177 msec     Systemic VTI:  0.15 m MV E velocity: 125.00 cm/s  Systemic Diam: 2.20 cm  Redell Leiter MD Electronically signed by Redell Leiter MD Signature Date/Time: 02/24/2023/4:53:14 PM    Final          ______________________________________________________________________________________________      Risk Assessment/Calculations:             Physical Exam:   VS:  BP 130/70   Pulse 92   Ht 5' 6 (1.676 m)   Wt 209 lb (94.8 kg)   SpO2 94%   BMI 33.73 kg/m    Wt Readings from Last 3 Encounters:  02/06/24 209 lb (94.8 kg)  01/12/24 209 lb (94.8 kg)  01/05/24 205 lb (93 kg)    GEN: Well nourished, well developed in no acute distress NECK: No JVD; No carotid bruits CARDIAC: RRR, no murmurs, rubs, gallops RESPIRATORY:  Clear to auscultation without rales, wheezing or rhonchi  ABDOMEN: Soft, non-tender, non-distended EXTREMITIES:  No edema; No deformity   ASSESSMENT AND PLAN: .   HFrEF -NYHA class II, euvolemic.  Most recent echo in July 2024 revealed an EF of 30%.  He previously refused biventricular device.  Continue Entresto  49-51 mg twice daily, continue metoprolol  25 mg daily. He tried Jardiance  for a few days but felt bad, he is willing to retry. Continue Lasix  as needed for pedal edema or shortness of breath.  CAD -CABG x 4 in 2019 >> Lexiscan  later that year revealed no ischemia.  He is not having any anginal complaints today.  Continue aspirin  81 mg daily, continue metoprolol  25 mg daily, continue nitroglycerin  as needed, continue Pravachol  20 mg daily.  Dyslipidemia-Previously intolerant of rosuvastatin . Most recent LDL was elevated at 127 and it was previously well controlled, being monitored by his PCP it appears.    Hypertension-blood pressure slightly elevated 140/70, however we should likely allow for permissive hypertension secondary  to autonomic dysfunction that accompanies his Parkinson disease.  Continue Entresto  49-51 mg twice daily, continue metoprolol  25 mg daily.  Major depression - currently managed by his PCP and a therapist. Denies HI/SI,  but continues to feel very low. Encouraged him to follow up with his MD if he thinks his symptoms are not well controlled.        Dispo: Restart Jardiance . 3 month follow up per his request.  Signed, Delon JAYSON Hoover, NP  "

## 2024-02-06 ENCOUNTER — Encounter: Payer: Self-pay | Admitting: Cardiology

## 2024-02-06 ENCOUNTER — Ambulatory Visit: Attending: Cardiology | Admitting: Cardiology

## 2024-02-06 VITALS — BP 130/70 | HR 92 | Ht 66.0 in | Wt 209.0 lb

## 2024-02-06 DIAGNOSIS — I5020 Unspecified systolic (congestive) heart failure: Secondary | ICD-10-CM

## 2024-02-06 DIAGNOSIS — I10 Essential (primary) hypertension: Secondary | ICD-10-CM | POA: Diagnosis not present

## 2024-02-06 DIAGNOSIS — I25119 Atherosclerotic heart disease of native coronary artery with unspecified angina pectoris: Secondary | ICD-10-CM | POA: Diagnosis not present

## 2024-02-06 DIAGNOSIS — E782 Mixed hyperlipidemia: Secondary | ICD-10-CM

## 2024-02-06 DIAGNOSIS — F329 Major depressive disorder, single episode, unspecified: Secondary | ICD-10-CM

## 2024-02-06 NOTE — Patient Instructions (Addendum)
 Medication Instructions:   No changes   Retry taking Jardiance  10 mg   *If you need a refill on your cardiac medications before your next appointment, please call your pharmacy*   Lab Work: Not needed If you have labs (blood work) drawn today and your tests are completely normal, you will receive your results only by: MyChart Message (if you have MyChart) OR A paper copy in the mail If you have any lab test that is abnormal or we need to change your treatment, we will call you to review the results.   Testing/Procedures:  Not needed  Follow-Up: At [facility], you and your health needs are our priority.  As part of our continuing mission to provide you with exceptional heart care, we have created designated Provider Care Teams.  These Care Teams include your primary Cardiologist (physician) and Advanced Practice Providers (APPs -  Physician Assistants and Nurse Practitioners) who all work together to provide you with the care you need, when you need it.     Your next appointment:   3 month(s)  The format for your next appointment:   In Person  Provider:   Delon Hoover, NP [place])

## 2024-02-12 ENCOUNTER — Other Ambulatory Visit: Payer: Self-pay

## 2024-02-12 ENCOUNTER — Telehealth: Payer: Self-pay

## 2024-02-12 DIAGNOSIS — Z604 Social exclusion and rejection: Secondary | ICD-10-CM

## 2024-02-12 DIAGNOSIS — H9190 Unspecified hearing loss, unspecified ear: Secondary | ICD-10-CM

## 2024-02-12 DIAGNOSIS — F339 Major depressive disorder, recurrent, unspecified: Secondary | ICD-10-CM

## 2024-02-12 DIAGNOSIS — Z5941 Food insecurity: Secondary | ICD-10-CM

## 2024-02-12 DIAGNOSIS — Z79899 Other long term (current) drug therapy: Secondary | ICD-10-CM

## 2024-02-12 NOTE — Patient Instructions (Signed)
 Visit Information  Thank you for taking time to visit with me today. Please don't hesitate to contact me if I can be of assistance to you before our next scheduled appointment.  Our next appointment is by telephone on 02/16/24 at 10:30 Please call the care guide team at [phone] if you need to cancel or reschedule your appointment.   Following is a copy of your care plan:   Goals Addressed             This Visit's Progress    VBCI RN Care Plan       Problems:  Care Coordination needs related to Depression   and Food Insecurity  Chronic Disease Management support and education needs related to CHF and HTN  Goal: Over the next 7 days the Patient will continue to work with Medical illustrator and/or Social Worker to address care management and care coordination needs related to CHF and HTN as evidenced by adherence to care management team scheduled appointments     work with Child psychotherapist to address Limited access to food and Mental Health Concerns  related to the management of CHF and HTN as evidenced by review of electronic medical record and patient or social worker report     Schedule annual eye exam  Interventions:   Heart Failure Interventions: Basic overview and discussion of pathophysiology of Heart Failure reviewed Provided education on low sodium diet Assessed need for readable accurate scales in home Provided education about placing scale on hard, flat surface Advised patient to weigh each morning after emptying bladder Discussed importance of daily weight and advised patient to weigh and record daily Reviewed role of diuretics in prevention of fluid overload and management of heart failure; Discussed the importance of keeping all appointments with provider Provided patient with education about the role of exercise in the management of heart failure Referral made to community resources care guide team for assistance with food insecurity;  Hypertension  Interventions: Last practice recorded BP readings:  BP Readings from Last 3 Encounters:  02/06/24 130/70  01/12/24 116/74  11/29/23 110/70   Most recent eGFR/CrCl:  Lab Results  Component Value Date   EGFR 87 01/12/2024    No components found for: CRCL  Evaluation of current treatment plan related to hypertension self management and patient's adherence to plan as established by provider Provided education to patient re: stroke prevention, s/s of heart attack and stroke Reviewed medications with patient and discussed importance of compliance Provided assistance with obtaining home blood pressure monitor via clinical pharmacist; Discussed plans with patient for ongoing care management follow up and provided patient with direct contact information for care management team Advised patient, providing education and rationale, to monitor blood pressure daily and record, calling PCP for findings outside established parameters Discussed complications of poorly controlled blood pressure such as heart disease, stroke, circulatory complications, vision complications, kidney impairment, sexual dysfunction Screening for signs and symptoms of depression related to chronic disease state  Assessed social determinant of health barriers  Patient Self-Care Activities:  Attend all scheduled provider appointments Attend church or other social activities Call pharmacy for medication refills 3-7 days in advance of running out of medications Call provider office for new concerns or questions  Take medications as prescribed   Work with the social worker to address care coordination needs and will continue to work with the clinical team to address health care and disease management related needs  Plan:  Telephone follow up appointment with care management team  member scheduled for:  referral to BSW for food insecurity Request sent to pharmacist to obtain blood pressure cuff. Request to PCP for new audiology  referral Request to PCP for psychiatric referral for medication management. Referral to Clinical Pharmacist for polypharmacy             Please call the Suicide and Crisis Lifeline: 988 call the [place]  National Suicide Prevention Lifeline: 863-634-8376 or TTY: 862-490-8545 TTY (919) 388-9875) to talk to a trained counselor call 1-800-273-TALK (toll free, 24 hour hotline) if you are experiencing a Mental Health or Behavioral Health Crisis or need someone to talk to.  Patient verbalizes understanding of instructions and care plan provided today and agrees to view in MyChart. Active MyChart status and patient understanding of how to access instructions and care plan via MyChart confirmed with patient.     SIGNATURE  Olam Idol BSN RN CCM [place]  [facility], [facility] RN Care Manager Direct Dial: [phone] Fax: [phone]

## 2024-02-12 NOTE — Patient Outreach (Signed)
 Complex Care Management   Visit Note  02/12/2024  Name:  Shawn Meza MRN: [id number] DOB: December 31, 1945  Situation: Referral received for Complex Care Management related to Heart Failure and SDOH Barriers:  Food insecurity Social Isolation Depression I obtained verbal consent from Patient.  Visit completed with patient  on the phone  Background:   Past Medical History:  Diagnosis Date   Abscess of right axilla 12/12/2017   Acute blood loss anemia 10/25/2017   Acute postoperative respiratory insufficiency 10/25/2017   Aortic regurgitation 09/16/2018   Arthritis    BMI 33.0-33.9,adult 12/12/2017   Cardiomyopathy, unspecified (HCC) 06/13/2018   Chest pain in adult 10/11/2017   Chronic coronary artery disease    Chronic systolic (congestive) heart failure (HCC) 09/18/2018   Colon cancer (HCC)    Coronary artery disease involving native coronary artery of native heart with angina pectoris (HCC) 12/28/2010   Added automatically from request for surgery [id number]     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        GERD (gastroesophageal reflux disease)    Hiatal hernia    History of cardiovascular disorder 05/20/2010   Qualifier: Diagnosis of   By: Joshua MD, Ned CROME.     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        Ischemic cardiomyopathy 09/16/2018   LBBB (left bundle branch block)    Long term current use of aspirin  03/29/2017   Long-term use of aspirin  therapy    Low back pain 10/28/2019   Lumbar spondylosis 06/29/2022    Major depression, chronic    Major depressive disorder, recurrent episode, moderate (HCC) 06/30/2022   Medial meniscus tear 10/11/2011   Memory change 07/06/2015   Metabolic syndrome    Mixed hyperlipidemia 05/20/2010   Qualifier: Diagnosis of   By: Joshua MD, Ned CROME.        Myofascial pain 12/20/2019   Nephrolithiasis    hx of   NEPHROLITHIASIS, HX OF 05/20/2010   Qualifier: Diagnosis of   By: Joshua MD, Ned CROME.        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   SOB (shortness of breath) 03/31/2017   Transient ischemic attack    hx of   Trochanteric bursitis of right hip     Assessment: Patient Reported Symptoms:  Cognitive Cognitive Status: Alert and oriented to person, place, and time      Neurological Neurological Review of Symptoms: Weakness Neurological Management Strategies: Routine screening Neurological Comment: fu with Dr Evonnie 04/23/24 for Parkinsons  HEENT HEENT Symptoms Reported: Change or loss of hearing, Other: (needs new referral to audiology) HEENT Management Strategies: Medication therapy, Routine screening    Cardiovascular Cardiovascular Symptoms Reported: Fatigue Does  patient have uncontrolled Hypertension?: Yes Is patient checking Blood Pressure at home?: No (patient needs new BP   request sent to pharmacist) Cardiovascular Management Strategies: Medication therapy, Routine screening  Respiratory Respiratory Symptoms Reported: No symptoms reported    Endocrine Endocrine Symptoms Reported: No symptoms reported Is patient diabetic?: No    Gastrointestinal Gastrointestinal Symptoms Reported: Not assessed      Genitourinary Genitourinary Symptoms Reported: No symptoms reported     Integumentary Additional Integumentary Details: patient diagnosed with skin melanoma, has appointment with derm 12/25, clinic is attempting to find sooner time Skin Management Strategies: Routine screening  Musculoskeletal Musculoskelatal Symptoms Reviewed: Not assessed        Psychosocial Psychosocial Symptoms Reported: Not assessed Additional Psychological Details: patient endorses depression, currently seeing therapist, requiested referral to psychiatrist for medication management. Behavioral Management Strategies: Medication therapy, Counseling   Quality of Family Relationships: non-existent Do you feel physically threatened by others?: No      01/05/2024    2:17 PM  Depression screen PHQ 2/9  Decreased Interest 3  Down, Depressed, Hopeless 3  PHQ - 2 Score 6  Altered sleeping 3  Tired, decreased energy 3  Change in appetite 0  Feeling bad or failure about yourself  3  Trouble concentrating 0  Moving slowly or fidgety/restless 0  Suicidal thoughts 0  PHQ-9 Score 15  Difficult doing work/chores Very difficult    There were no vitals filed for this visit.  Medications Reviewed Today     Reviewed by Lonzell Planas, RN (Registered Nurse) on 02/12/24 at 1135  Med List Status: <None>   Medication Order Taking? Sig Documenting Provider Last Dose Status Informant  acetaminophen  (TYLENOL ) 325 MG tablet [id number] Yes Take 162.5 mg by mouth every 6 (six) hours as needed for mild pain or moderate pain. [provider]  Active   aspirin  81 MG EC tablet [id number] Yes Take 1 tablet (81 mg total) by mouth daily. Frann Mabel Mt, DO  Active   azelastine  (ASTELIN ) 0.1 % nasal spray [id number] Yes Place 2 sprays into both nostrils 2 (two) times daily. Use in each nostril as directed Frann Mabel Mt, DO  Active   beclomethasone (QVAR  REDIHALER) 40 MCG/ACT inhaler [id number] Yes Inhale 2 puffs into the lungs 2 (two) times daily. Frann Mabel Mt, DO  Active    Carbidopa -Levodopa  ER (RYTARY ) 61.25-245 MG CPCR [id number] Yes 1 four times per day, the last at bedtime [name], Asberry RAMAN, DO  Active   citalopram  (CELEXA ) 20 MG tablet [id number] Yes Take 1 tablet (20 mg total) by mouth daily. Frann Mabel Mt, DO  Active   empagliflozin  (JARDIANCE ) 10 MG TABS tablet [id number]  Take 10 mg by mouth daily.  Patient not taking: Reported on 02/12/2024   [provider]  Active   esomeprazole  (NEXIUM ) 40 MG capsule [id number] Yes Take 1 capsule (40 mg total) by mouth daily. Frann Mabel Mt, DO  Active   fluticasone  (FLONASE ) 50 MCG/ACT nasal spray [id number] Yes Place 2 sprays into both nostrils daily. Frann Mabel Mt, DO  Active   furosemide  (LASIX ) 20 MG tablet [id number] Yes Take 1 tablet (20 mg total) by mouth as needed for fluid or edema (shortness of breath). Frann Mabel Mt, DO  Active   gabapentin  (NEURONTIN ) 100 MG capsule [id number] Yes Take 1 capsule (100 mg total) by mouth at bedtime. Frann Mabel Mt, DO  Active   levocetirizine (XYZAL ) 5 MG tablet [id number] Yes Take 1 tablet (5 mg total) by mouth every evening.  For allergies Frann Mabel Mt, DO  Active   metoprolol  tartrate (LOPRESSOR ) 25 MG tablet [id number] Yes Take 1 tablet (25 mg total) by mouth daily. Monetta Redell PARAS, MD  Active   montelukast  (SINGULAIR ) 10 MG tablet [id number] Yes Take 1 tablet (10 mg total) by mouth at bedtime. Frann Mabel Mt, DO  Active   nitroGLYCERIN  (NITROSTAT ) 0.4 MG SL tablet [id number] Yes Place 1 tablet (0.4 mg total) under the tongue every 5 (five) minutes x 3 doses as needed for chest pain. If chest pain is not relieved after 2nd dose - call 911. Frann Mabel Mt, DO  Active   pravastatin  (PRAVACHOL ) 80 MG tablet [id number] Yes Take 1 tablet (80 mg total) by mouth every evening. Carlin Delon BROCKS, NP  Active   sacubitril -valsartan  (ENTRESTO ) 49-51 MG [id number] Yes TAKE 1 TABLET BY MOUTH 2 TIMES DAILY. Krasowski,  Robert J, MD  Active             Recommendation:   PCP Follow-up Referral to: Clinical Pharmacist for BP cuff order and polypharmacy Request to PCP for referrals to audiology for hearing test and to psychiatry for medication management.  Follow Up Plan:   Telephone follow up appointment date/time:  02/16/24 at 10:30  SIG  Olam Idol BSN RN CCM [place]  [facility], [facility] Health RN Care Manager Direct Dial: [phone] Fax: [phone]

## 2024-02-13 NOTE — Telephone Encounter (Signed)
 Referral sent

## 2024-02-14 ENCOUNTER — Telehealth: Payer: Self-pay

## 2024-02-14 ENCOUNTER — Ambulatory Visit (INDEPENDENT_AMBULATORY_CARE_PROVIDER_SITE_OTHER): Admitting: Psychology

## 2024-02-14 DIAGNOSIS — F339 Major depressive disorder, recurrent, unspecified: Secondary | ICD-10-CM

## 2024-02-14 DIAGNOSIS — F419 Anxiety disorder, unspecified: Secondary | ICD-10-CM

## 2024-02-14 DIAGNOSIS — F331 Major depressive disorder, recurrent, moderate: Secondary | ICD-10-CM

## 2024-02-14 NOTE — Progress Notes (Unsigned)
 Complex Care Management Note Care Guide Note  02/14/2024 Name: Shawn Meza MRN: [id number] DOB: 10-09-1945   Complex Care Management Outreach Attempts: An unsuccessful telephone outreach was attempted today to offer the patient information about available complex care management services.  Follow Up Plan:  Additional outreach attempts will be made to offer the patient complex care management information and services.   Encounter Outcome:  No Answer  Dreama [facility]wood [facility]  [facility], [facility] Health Care Management Assistant Direct Dial: [phone]  Fax: [phone]
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
## 2024-02-15 NOTE — Progress Notes (Signed)
 Complex Care Management Note  Care Guide Note 02/15/2024 Name: Shawn Meza MRN: [id number] DOB: Sep 09, 1945  Shawn Meza is a 78 y.o. year old male who sees Frann, Mabel Mt, DO for primary care. I reached out to [name] by phone today to offer complex care management services.  [name] was given information about Complex Care Management services today including:   The Complex Care Management services include support from the care team which includes your Nurse Care Manager, Clinical Social Worker, or Pharmacist.  The Complex Care Management team is here to help remove barriers to the health concerns and goals most important to you. Complex Care Management services are voluntary, and the patient may decline or stop services at any time by request to their care team member.   Complex Care Management Consent Status: Patient agreed to services and verbal consent obtained.   Follow up plan:  Telephone appointment with complex care management team member scheduled for:  02/21/24 at 3:00 p.m.   Encounter Outcome:  Patient Scheduled  Dreama [facility]  [facility], [facility] Health Care Management Assistant Direct Dial: [phone]  Fax: [phone]

## 2024-02-16 ENCOUNTER — Other Ambulatory Visit: Payer: Self-pay

## 2024-02-16 NOTE — Progress Notes (Signed)
 Complex Care Management Care Guide Note  02/16/2024 Name: EMILY FORSE MRN: [id number] DOB: [birth year]  ELMA LIMAS is a 78 y.o. year old male who is a primary care patient of Frann Mabel Mt, DO and is actively engaged with the care management team. I reached out to [name] by phone today to assist with re-scheduling  with the BSW.  Follow up plan: Patient denied request for SDOH needs.  Dreama Lynwood [facility]  [facility], [facility] Health Care Management Assistant Direct Dial: [phone]  Fax: [phone]

## 2024-02-16 NOTE — Patient Outreach (Signed)
 Complex Care Management   Visit Note  02/16/2024  Name:  Shawn Meza MRN: [id number] DOB: August 03, 1945  Situation: Referral received for Complex Care Management related to Heart Failure and SDOH Barriers:  Food insecurity I obtained verbal consent from Patient.  Visit completed with patient  on the phone  Background:   Past Medical History:  Diagnosis Date   Abscess of right axilla 12/12/2017   Acute blood loss anemia 10/25/2017   Acute postoperative respiratory insufficiency 10/25/2017   Aortic regurgitation 09/16/2018   Arthritis    BMI 33.0-33.9,adult 12/12/2017   Cardiomyopathy, unspecified (HCC) 06/13/2018   Chest pain in adult 10/11/2017   Chronic coronary artery disease    Chronic systolic (congestive) heart failure (HCC) 09/18/2018   Colon cancer (HCC)    Coronary artery disease involving native coronary artery of native heart with angina pectoris (HCC) 12/28/2010   Added automatically from request for surgery [id number]     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        GERD (gastroesophageal reflux disease)    Hiatal hernia    History of cardiovascular disorder 05/20/2010   Qualifier: Diagnosis of   By: Joshua MD, Ned CROME.     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        Ischemic cardiomyopathy 09/16/2018   LBBB (left bundle branch block)    Long term current use of aspirin  03/29/2017   Long-term use of aspirin  therapy    Low back pain 10/28/2019   Lumbar spondylosis 06/29/2022   Major depression, chronic     Major depressive disorder, recurrent episode, moderate (HCC) 06/30/2022   Medial meniscus tear 10/11/2011   Memory change 07/06/2015   Metabolic syndrome    Mixed hyperlipidemia 05/20/2010   Qualifier: Diagnosis of   By: Joshua MD, Ned CROME.        Myofascial pain 12/20/2019   Nephrolithiasis    hx of   NEPHROLITHIASIS, HX OF 05/20/2010   Qualifier: Diagnosis of   By: Joshua MD, Ned CROME.        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   SOB (shortness of breath) 03/31/2017   Transient ischemic attack    hx of   Trochanteric bursitis of right hip     Assessment: Patient Reported Symptoms:  Cognitive Cognitive Status: Alert and oriented to person, place, and time      Neurological Neurological Review of Symptoms: Not assessed    HEENT HEENT Symptoms Reported: Not assessed      Cardiovascular Cardiovascular Symptoms Reported: Fatigue Does patient have uncontrolled Hypertension?: Yes Is patient checking Blood Pressure at home?: No Patient's Recent BP reading at home: patient to obtain BP cuff with assistance from clinical  pharmacist Cardiovascular Management Strategies: Medication therapy, Routine screening Weight: 203 lb (92.1 kg)  Respiratory Respiratory Symptoms Reported: No symptoms reported    Endocrine Endocrine Symptoms Reported: No symptoms reported Is patient diabetic?: No    Gastrointestinal Gastrointestinal Symptoms Reported: No symptoms reported      Genitourinary Genitourinary Symptoms Reported: No symptoms reported    Integumentary Integumentary Symptoms Reported: Skin changes Additional Integumentary Details: patient was rescheduled with derm surgical 03/19/24 Skin Management Strategies: Routine  screening  Musculoskeletal Musculoskelatal Symptoms Reviewed: No symptoms reported        Psychosocial Psychosocial Symptoms Reported: Not assessed Additional Psychological Details: patient reports will call [employer] to obtain name of in network psychiatrist for med management            01/05/2024    2:17 PM  Depression screen PHQ 2/9  Decreased Interest 3  Down, Depressed, Hopeless 3  PHQ - 2 Score 6  Altered sleeping 3  Tired, decreased energy 3  Change in appetite 0  Feeling bad or failure about yourself  3  Trouble concentrating 0  Moving slowly or fidgety/restless 0  Suicidal thoughts 0  PHQ-9 Score 15  Difficult doing work/chores Very difficult    There were no vitals filed for this visit.  Medications Reviewed Today   Medications were not reviewed in this encounter     Recommendation:   Continue Current Plan of Care  Follow Up Plan:   Telephone follow up appointment date/time:  03/01/24 at 2:00   SIG   Olam Idol BSN RN CCM [place]  [facility], [facility] RN Care Manager Direct Dial: [phone] Fax: [phone]

## 2024-02-16 NOTE — Patient Instructions (Signed)
 Visit Information  Thank you for taking time to visit with me today. Please don't hesitate to contact me if I can be of assistance to you before our next scheduled appointment.  Our next appointment is by telephone on 03/01/24 at 2:00 Please call the care guide team at [phone] if you need to cancel or reschedule your appointment.   Following is a copy of your care plan:   Goals Addressed             This Visit's Progress    VBCI RN Care Plan   Improving    Problems:  Care Coordination needs related to Depression   and Food Insecurity  Chronic Disease Management support and education needs related to CHF and HTN  Goal: Over the next 7 days the Patient will continue to work with Medical illustrator and/or Social Worker to address care management and care coordination needs related to CHF and HTN as evidenced by adherence to care management team scheduled appointments     work with Child psychotherapist to address Limited access to food and Mental Health Concerns  related to the management of CHF and HTN as evidenced by review of electronic medical record and patient or social worker report     Schedule annual eye exam  Interventions:   Heart Failure Interventions: Basic overview and discussion of pathophysiology of Heart Failure reviewed Provided education on low sodium diet Assessed need for readable accurate scales in home Provided education about placing scale on hard, flat surface Advised patient to weigh each morning after emptying bladder Discussed importance of daily weight and advised patient to weigh and record daily Reviewed role of diuretics in prevention of fluid overload and management of heart failure; Discussed the importance of keeping all appointments with provider Provided patient with education about the role of exercise in the management of heart failure Referral made to community resources care guide team for assistance with food insecurity;  Hypertension  Interventions: Last practice recorded BP readings:  BP Readings from Last 3 Encounters:  02/06/24 130/70  01/12/24 116/74  11/29/23 110/70   Most recent eGFR/CrCl:  Lab Results  Component Value Date   EGFR 87 01/12/2024    No components found for: CRCL  Evaluation of current treatment plan related to hypertension self management and patient's adherence to plan as established by provider Provided education to patient re: stroke prevention, s/s of heart attack and stroke Reviewed medications with patient and discussed importance of compliance Provided assistance with obtaining home blood pressure monitor via clinical pharmacist; Discussed plans with patient for ongoing care management follow up and provided patient with direct contact information for care management team Advised patient, providing education and rationale, to monitor blood pressure daily and record, calling PCP for findings outside established parameters Discussed complications of poorly controlled blood pressure such as heart disease, stroke, circulatory complications, vision complications, kidney impairment, sexual dysfunction Screening for signs and symptoms of depression related to chronic disease state  Assessed social determinant of health barriers  Patient Self-Care Activities:  Attend all scheduled provider appointments Attend church or other social activities Call pharmacy for medication refills 3-7 days in advance of running out of medications Call provider office for new concerns or questions  Take medications as prescribed   Work with the social worker to address care coordination needs and will continue to work with the clinical team to address health care and disease management related needs  Plan:  Telephone follow up appointment with care management team  member scheduled for:  03/01/24 at 2:00             Please call the Suicide and Crisis Lifeline: 988 call the [place]  National Suicide  Prevention Lifeline: 917-259-4530 or TTY: 6400059426 TTY (716)854-0553) to talk to a trained counselor call 1-800-273-TALK (toll free, 24 hour hotline) if you are experiencing a Mental Health or Behavioral Health Crisis or need someone to talk to.  Patient verbalizes understanding of instructions and care plan provided today and agrees to view in MyChart. Active MyChart status and patient understanding of how to access instructions and care plan via MyChart confirmed with patient.     SIGNATURE  Olam Idol BSN RN CCM [place]  [facility], [facility] RN Care Manager Direct Dial: [phone] Fax: [phone]

## 2024-02-21 ENCOUNTER — Telehealth: Payer: Self-pay | Admitting: Pharmacist

## 2024-02-21 ENCOUNTER — Ambulatory Visit (INDEPENDENT_AMBULATORY_CARE_PROVIDER_SITE_OTHER): Admitting: Pharmacist

## 2024-02-21 DIAGNOSIS — I2510 Atherosclerotic heart disease of native coronary artery without angina pectoris: Secondary | ICD-10-CM

## 2024-02-21 DIAGNOSIS — I2583 Coronary atherosclerosis due to lipid rich plaque: Secondary | ICD-10-CM

## 2024-02-21 MED ORDER — FUROSEMIDE 20 MG PO TABS: 20.0000 mg | ORAL_TABLET | ORAL | 0 refills | Status: DC | PRN

## 2024-02-21 MED ORDER — NITROGLYCERIN 0.4 MG SL SUBL: 0.4000 mg | SUBLINGUAL_TABLET | SUBLINGUAL | 0 refills | Status: AC | PRN

## 2024-02-21 MED ORDER — ESOMEPRAZOLE MAGNESIUM 40 MG PO CPDR: 40.0000 mg | DELAYED_RELEASE_CAPSULE | Freq: Every day | ORAL | 0 refills | Status: DC

## 2024-02-21 MED ORDER — METOPROLOL TARTRATE 25 MG PO TABS: 25.0000 mg | ORAL_TABLET | Freq: Every day | ORAL | 0 refills | Status: DC

## 2024-02-21 NOTE — Telephone Encounter (Signed)
 Attempt was made to contact patient by phone today by Clinical Pharmacist regarding CHF / hypertension / medication management.  Unable to reach patient. LM on VM with my contact number [phone].

## 2024-02-21 NOTE — Telephone Encounter (Signed)
 Patient return call and transferred to [phone].  Per previous notation

## 2024-02-21 NOTE — Progress Notes (Signed)
 Pharmacy Note  02/21/2024 Name: Shawn Meza MRN: [id number] DOB: 02/14/1946  Subjective: Shawn Meza is a 78 y.o. year old male who is a primary care patient of Frann Mabel Mt, DO. Clinical Pharmacist Practitioner referral was placed to assist with medication management.    Engaged with patient by telephone for follow up visit today.  Upcoming appointments:  Mohs surgery - Dr Corey - 03/19/2024 Neuro - Dr [name] - 04/23/2024 PCP / Frann - not currently scheduled - Last office visit was 12/31/2023  Medication Management:  Patient has been noted in the past to take medications inconsistently. He reports he is taking medications as prescribed today but there are several medications that are past due to refill.  He asks for assistance in ordering from his pharmacy.   He does report that he had stopped Jardiance  for about 2 months because he felt he might be having GI side effects. He reports he restarted Jardiance  after his visit with Delon Hoover on 02/06/2024.  Patient is using weekly pill container to help with remembering to take medication.  He also asks for pharmacy to print what medication is for on the label but they do not always remember.   Depression / GAD:  Current therapy: citalopram  20mg  daily.  Patient reports fewer mood swings and anger episodes. He is taking at night because he feels the citalopram  makes him sleepy.  He is seeing counselor regularly. Dr Frann has recommended he see psychiatrist but he is having trouble finding someone that takes his insurance.   CHF:  ECHO 02/24/2023 - EF improved but still low at 30%. Blood pressure has been at goal at lat 3 office visits. Denies dizziness but patient would like a blood pressure cuff to check his blood pressure at home. He would prefer a wrist cuff because it would be easier to use compared to arm cuff (arm cuff is hard to use due to weakness/ shakiness related to Parkinson's)  Current therapy:   Jardiance  10mg  daily, furosemide  20mg  daily if needed; metoprolol  tartrate 25mg  daily and Entresto  - 49/51mg   twice a day,   Patient denies shortness of breath or increase in weight.  He is checking weight daily but has not bee recording. Patient report weight has been stable.  Reports occsional edema in lower extremities that is relieved with as needed furosemide   Parkinson's Disease:  Current therapy - Rytary  4 times a day (dose increased 10/18/23 after his last appointment with Dr [name]).    Objective: Review of patient status, including review of consultants reports, laboratory and other test data, was performed as part of comprehensive.  Lab Results  Component Value Date   CREATININE 0.90 01/12/2024   CREATININE 1.21 11/29/2023   CREATININE 0.95 09/28/2023    Lab Results  Component Value Date   HGBA1C 5.8 11/28/2022       Component Value Date/Time   CHOL 198 11/29/2023 1621   CHOL 163 03/31/2022 1708   TRIG 111.0 11/29/2023 1621   TRIG 107 05/09/2010 0000   HDL 48.90 11/29/2023 1621   HDL 36 (L) 03/31/2022 1708   CHOLHDL 4 11/29/2023 1621   VLDL 22.2 11/29/2023 1621   LDLCALC 127 (H) 11/29/2023 1621   LDLCALC 91 03/31/2022 1708   LDLDIRECT 112 (H) 09/28/2023 1209     Clinical ASCVD: Yes  The ASCVD Risk score (Arnett DK, et al., 2019) failed to calculate for the following reasons:   Risk score cannot be calculated because patient has a medical  history suggesting prior/existing ASCVD    BP Readings from Last 3 Encounters:  02/06/24 130/70  01/12/24 116/74  11/29/23 110/70     Allergies  Allergen Reactions   Tizanidine  Hcl Hives   Fluoxetine  Other (See Comments)    Caused depression and aggression   Rosuvastatin  Other (See Comments)    Whole body aches   Testosterone Other (See Comments)    ABDOMINAL PAIN and cramping   Ropinirole  Hcl Nausea Only   Requip  [Ropinirole ] Nausea Only    Medications Reviewed Today   Medications were not reviewed in this  encounter     Patient Active Problem List   Diagnosis Date Noted   LBBB (left bundle branch block)    Major depressive disorder, recurrent episode, moderate (HCC) 06/30/2022   Lumbar spondylosis 06/29/2022   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   Diarrhea 11/04/2020   Rectal bleeding 11/04/2020   Trochanteric bursitis of right hip    Transient ischemic attack    NSTEMI (non-ST elevated myocardial infarction) (HCC)    Nephrolithiasis    Metabolic syndrome    Major depression, chronic    Long-term use of aspirin  therapy    Hypercholesterolemia    Hiatal hernia    GERD (gastroesophageal reflux disease)    Essential tremor    Essential hypertension    Erectile dysfunction    Elevated PSA    Colon cancer (HCC)    Chronic coronary artery disease    Arthritis    Myofascial pain 12/20/2019   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   Low back pain 10/28/2019   Radiculopathy, lumbar region 10/28/2019   Fatigue 01/15/2019   Chronic systolic (congestive) heart failure (HCC) 09/18/2018   Ischemic cardiomyopathy 09/16/2018   Aortic regurgitation 09/16/2018   Cardiomyopathy, unspecified (HCC) 06/13/2018   Abscess of right axilla 12/12/2017   BMI 33.0-33.9,adult 12/12/2017   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   Acute blood loss anemia 10/25/2017   Acute postoperative respiratory insufficiency 10/25/2017   Postoperative delirium 10/25/2017   Dyslipidemia 10/17/2017   Chest pain in adult 10/11/2017   SOB (shortness of breath) 03/31/2017   Long term current use of aspirin  03/29/2017   Parkinson's disease (HCC) 01/02/2017   Memory change 07/06/2015   Depression, recurrent (HCC) 07/06/2015   Medial meniscus tear 10/11/2011   Neuroma of foot 10/11/2011   Coronary artery disease involving native coronary artery of native heart with angina pectoris (HCC) 12/28/2010   OTHER TESTICULAR HYPOFUNCTION 05/20/2010   Mixed hyperlipidemia 05/20/2010   Hypertensive heart  disease with heart failure (HCC) 05/20/2010   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 05/20/2010   GERD 05/20/2010   History of cardiovascular disorder 05/20/2010   NEPHROLITHIASIS, HX OF 05/20/2010   BENIGN PROSTATIC HYPERTROPHY, HX OF, S/P TURP 05/20/2010     Medication Assistance:  None required.  Patient affirms current coverage meets needs. (Has [facility] and Medicaid coverage)   Medication List for Hubert Raatz (DOB: 10-26-1945) Updated 02/21/2024 Medication Reason for taking 30 minutes before breakfast Morning / with breakfast Lunch Dinner Bedtime  Esomeprazole /Nexium  40mg  capsule Acid reflux 1 capsule      Rytary  61.5/245 mg Parkinson's  1 capsule 1 capsule 1 capsule 1 capsule  Entresto   Heart  1 tablet  1 tablet   Jardiance  10mg  Heart/ kidney protection  1 tablet     Metoprolol  25mg   Heart / blood pressure  1 tablet     Pravastatin  80mg   Heart / cholesterol  Citalopram  20mg  Mood     1 tablet  Gabapentin  100mg  Nerve / leg pain at night     1 capsule  Montelukast  10mg  Allergies / asthma     1 tablet  Levocetirizine 5mg  Allergies     1 tablet  Pravastatin  80mg   Heart / cholesterol     1 tablet           Fluticasone  nasal spray (Flonase )  Allergic rhinitis / runny nose  2 sprays in each nostril     Azelastine  0.1% nasal spray (Astelin ) Runny nose  2 sprays in each nostril   2 sprays in each nostril  Qvar  inhaler Breathing / asthma  2 inhalations into lungs   2 inhalations into lungs            Medications to take if needed   Tylenol  325mg       Take 0.5 to 1 tablet at bedtime as needed for sleep  Nitroglycerin  tablets  If having chest pain, place 1 tablet under your tongue and allow to dissolve, if chest pain continues can report for 2 more doses but call 911 immediate if chest pain does not improve with 2nd dose.    Furosemide  20mg  Heart / swelling Check weight every day and record in a notebook. Take furosemide  if you notice an increase of 3 or more  pounds in 24 hours or 5 pounds in a week OR if you have shortness of breath, swelling in your legs or ankles      Assessment / Plan: CHF / hypertension:  Last 3 office BPs have been well controlled.  Continue current medication therapy - Jardiance  10mg , Entresto , metoprolol  25mg  once a day and furosemide  20mg  daily as needed. Patient's [facility] plan has over-the-counter benefits ($352 / month) that should help with the cost of blood pressure cuff. Our pharmacy program only has arm cuffs.   Parkinson's: Continue Rytary  ER per Dr Collie directions. .   Depression:  Continue citalopram  to 20mg  daily Provided names of area psychiatric provider who takes [facility] at [address] [place] in [place] [phone] - Darice Hermanns, NP  Medication management  Reviewed and updated medication list Reviewed refill history and adherence.  Encouraged patient to use weekly pill container to help with medication adherance.  Mailed him an updated medication chart - see above.  Requested needed meds from [facility] at patient requeset; updated Rx's below.   Meds ordered this encounter  Medications   nitroGLYCERIN  (NITROSTAT ) 0.4 MG SL tablet    Sig: Place 1 tablet (0.4 mg total) under the tongue every 5 (five) minutes x 3 doses as needed for chest pain. If chest pain is not relieved after 2nd dose - call 911.    Dispense:  25 tablet    Refill:  0   esomeprazole  (NEXIUM ) 40 MG capsule    Sig: Take 1 capsule (40 mg total) by mouth daily.    Dispense:  90 capsule    Refill:  0   metoprolol  tartrate (LOPRESSOR ) 25 MG tablet    Sig: Take 1 tablet (25 mg total) by mouth daily.    Dispense:  90 tablet    Refill:  0   furosemide  (LASIX ) 20 MG tablet    Sig: Take 1 tablet (20 mg total) by mouth as needed for fluid or edema (shortness of breath).    Dispense:  90 tablet    Refill:  0     Follow Up:  4 to 6 weeks; Follow up with PCP May 2025.    Madelin Ray,  PharmD Clinical Pharmacist [facility]  - [facility] [phone]

## 2024-02-26 ENCOUNTER — Emergency Department (HOSPITAL_COMMUNITY)
Admission: EM | Admit: 2024-02-26 | Discharge: 2024-02-26 | Disposition: A | Discharge status: Home / Self Care | Attending: Emergency Medicine | Admitting: Emergency Medicine

## 2024-02-26 ENCOUNTER — Other Ambulatory Visit: Payer: Self-pay | Admitting: Family Medicine

## 2024-02-26 ENCOUNTER — Other Ambulatory Visit: Payer: Self-pay

## 2024-02-26 DIAGNOSIS — I2510 Atherosclerotic heart disease of native coronary artery without angina pectoris: Secondary | ICD-10-CM | POA: Diagnosis not present

## 2024-02-26 DIAGNOSIS — H5789 Other specified disorders of eye and adnexa: Secondary | ICD-10-CM | POA: Diagnosis present

## 2024-02-26 DIAGNOSIS — R410 Disorientation, unspecified: Secondary | ICD-10-CM | POA: Diagnosis not present

## 2024-02-26 DIAGNOSIS — L539 Erythematous condition, unspecified: Secondary | ICD-10-CM | POA: Diagnosis not present

## 2024-02-26 DIAGNOSIS — H109 Unspecified conjunctivitis: Secondary | ICD-10-CM | POA: Insufficient documentation

## 2024-02-26 DIAGNOSIS — Z85038 Personal history of other malignant neoplasm of large intestine: Secondary | ICD-10-CM | POA: Insufficient documentation

## 2024-02-26 DIAGNOSIS — H1033 Unspecified acute conjunctivitis, bilateral: Secondary | ICD-10-CM | POA: Diagnosis not present

## 2024-02-26 DIAGNOSIS — Z7982 Long term (current) use of aspirin: Secondary | ICD-10-CM | POA: Diagnosis not present

## 2024-02-26 DIAGNOSIS — I509 Heart failure, unspecified: Secondary | ICD-10-CM | POA: Diagnosis not present

## 2024-02-26 DIAGNOSIS — R251 Tremor, unspecified: Secondary | ICD-10-CM | POA: Diagnosis not present

## 2024-02-26 DIAGNOSIS — I499 Cardiac arrhythmia, unspecified: Secondary | ICD-10-CM | POA: Diagnosis not present

## 2024-02-26 LAB — BASIC METABOLIC PANEL WITH GFR
Anion gap: 9 (ref 5–15)
BUN: 26 mg/dL — ABNORMAL HIGH (ref 8–23)
CO2: 26 mmol/L (ref 22–32)
Calcium: 8.7 mg/dL — ABNORMAL LOW (ref 8.9–10.3)
Chloride: 106 mmol/L (ref 98–111)
Creatinine, Ser: 0.98 mg/dL (ref 0.61–1.24)
GFR, Estimated: >60 mL/min (ref >60)
Glucose, Bld: 93 mg/dL (ref 70–99)
Potassium: 3.9 mmol/L (ref 3.5–5.1)
Sodium: 141 mmol/L (ref 135–145)

## 2024-02-26 LAB — URINALYSIS, W/ REFLEX TO CULTURE (INFECTION SUSPECTED)
Bacteria, UA: NONE SEEN
Bilirubin Urine: NEGATIVE
Glucose, UA: NEGATIVE mg/dL
Hgb urine dipstick: NEGATIVE
Ketones, ur: NEGATIVE mg/dL
Leukocytes,Ua: NEGATIVE
Nitrite: NEGATIVE
Protein, ur: NEGATIVE mg/dL
Specific Gravity, Urine: 1.014 (ref 1.005–1.030)
pH: 5 (ref 5.0–8.0)

## 2024-02-26 LAB — CBC
HCT: 46.7 % (ref 39.0–52.0)
Hemoglobin: 15.2 g/dL (ref 13.0–17.0)
MCH: 31.6 pg (ref 26.0–34.0)
MCHC: 32.5 g/dL (ref 30.0–36.0)
MCV: 97.1 fL (ref 80.0–100.0)
Platelets: 181 K/uL (ref 150–400)
RBC: 4.81 MIL/uL (ref 4.22–5.81)
RDW: 12.5 % (ref 11.5–15.5)
WBC: 8.6 K/uL (ref 4.0–10.5)
nRBC: 0 % (ref 0.0–0.2)

## 2024-02-26 MED ORDER — LACTATED RINGERS IV BOLUS
500.0000 mL | Freq: Once | INTRAVENOUS | Status: AC
  Administered 2024-02-26: 500 mL via INTRAVENOUS

## 2024-02-26 MED ORDER — POLYMYXIN B-TRIMETHOPRIM 10000-0.1 UNIT/ML-% OP SOLN: 1.0000 [drp] | OPHTHALMIC | 0 refills | Status: DC
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
## 2024-02-26 NOTE — Discharge Instructions (Addendum)
 Start taking the antibiotic eyedrop as prescribed.  Follow-up with an eye doctor for further evaluation if the symptoms do not resolve over the next couple of days
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
## 2024-02-28 ENCOUNTER — Ambulatory Visit: Admitting: Psychology

## 2024-02-29 ENCOUNTER — Encounter: Payer: Self-pay | Admitting: Dermatology

## 2024-03-01 ENCOUNTER — Other Ambulatory Visit: Payer: Self-pay | Admitting: *Deleted

## 2024-03-13 ENCOUNTER — Ambulatory Visit: Admitting: Psychology

## 2024-03-13 DIAGNOSIS — F329 Major depressive disorder, single episode, unspecified: Secondary | ICD-10-CM

## 2024-03-13 DIAGNOSIS — F419 Anxiety disorder, unspecified: Secondary | ICD-10-CM

## 2024-03-13 DIAGNOSIS — F331 Major depressive disorder, recurrent, moderate: Secondary | ICD-10-CM
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
## 2024-03-15 ENCOUNTER — Other Ambulatory Visit: Payer: Self-pay | Admitting: *Deleted

## 2024-03-15 ENCOUNTER — Encounter: Payer: Self-pay | Admitting: *Deleted

## 2024-03-15 NOTE — Patient Outreach (Signed)
 Complex Care Management   Visit Note  05/09/2024 updated note for 03/15/24  Name:  Shawn Meza MRN: [id number] DOB: Jul 03, 1946  Situation: Referral received for Complex Care Management related to SDOH Barriers:  Food insecurity Social Isolation polypharmacy I obtained verbal consent from Patient.  Visit completed with Shawn Meza  on the phone  Shawn Meza Reports today that he is doing okay He reports he has not been out of his home today as it is not an easy task for him but when he gets out he is fine He was able to speak with Alm on 03/13/24   Conjunctivitis since 02/26/24 went to ED  He got frustrated with psychiatrist related to not being able to fill out his forms- 8-10 pages fine print hard to see male name began with a [place] DSS visited this week about pcs after 2-2 1/2 weeks - need more help keeping up his home  Neighbor (male) checks on him   Santina for his hearing test last week but the company does not take his insurance coverage Cost $2200-9000  His brother always call at 9 pm daily  Staying in contact with his ex wife but knows he could not take her   Background:   Past Medical History:  Diagnosis Date   Abscess of right axilla 12/12/2017   Acute blood loss anemia 10/25/2017   Acute postoperative respiratory insufficiency 10/25/2017   Aortic regurgitation 09/16/2018   Arthritis    BMI 33.0-33.9,adult 12/12/2017   Cardiomyopathy, unspecified (HCC) 06/13/2018   Chest pain in adult 10/11/2017   Chronic coronary artery disease    Chronic systolic (congestive) heart failure (HCC) 09/18/2018   Colon cancer (HCC)    Coronary artery disease involving native coronary artery of native heart with angina pectoris 12/28/2010   Added automatically from request for surgery [id number]     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   Depression, recurrent 07/06/2015   Diarrhea 11/04/2020   Dyslipidemia 10/17/2017   Added automatically from request for surgery  [id number]     Elevated PSA    Erectile dysfunction    Essential hypertension    Essential tremor    Fatigue 01/15/2019   GERD 05/20/2010   Qualifier: Diagnosis of   By: Joshua MD, Ned CROME.        GERD (gastroesophageal reflux disease)    Hiatal hernia    History of cardiovascular disorder 05/20/2010   Qualifier: Diagnosis of   By: Joshua MD, Ned CROME.     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        Ischemic cardiomyopathy 09/16/2018   LBBB (left bundle branch block)    Long term current use of aspirin  03/29/2017   Long-term use of aspirin  therapy    Low back pain 10/28/2019   Lumbar spondylosis 06/29/2022   Major depression, chronic    Major depressive disorder, recurrent episode, moderate (HCC) 06/30/2022   Medial meniscus tear 10/11/2011   Memory change 07/06/2015   Metabolic syndrome    Mixed hyperlipidemia 05/20/2010   Qualifier: Diagnosis of   By: Joshua MD, Ned CROME.        Myofascial pain 12/20/2019   Nephrolithiasis    hx of   NEPHROLITHIASIS, HX OF 05/20/2010   Qualifier: Diagnosis of   By: Joshua MD, Ned CROME.  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   SOB (shortness of breath) 03/31/2017   Transient ischemic attack    hx of   Trochanteric bursitis of right hip     Assessment: Patient Reported Symptoms:  Cognitive Cognitive Status: Alert and oriented to person, place, and time, Insightful and able to interpret abstract concepts, Normal speech and language skills Cognitive/Intellectual Conditions Management [RPT]: None reported or documented in medical history or problem  list   Health Maintenance Behaviors: Social activities, Sleep adequate Healing Pattern: Slow Health Facilitated by: Pain control, Rest  Neurological Neurological Review of Symptoms: Weakness, Vision changes Neurological Management Strategies: Routine screening Neurological Self-Management Outcome: 3 (uncertain) Neurological Comment: scheduled to follow up with Dr Evonnie 04/23/24 for parkinsons  HEENT   HEENT Management Strategies: Routine screening, Medication therapy HEENT Self-Management Outcome: 4 (good) HEENT Comment: clear water sealant got in his eyes    Cardiovascular Cardiovascular Symptoms Reported: Fatigue Does patient have uncontrolled Hypertension?: Yes Cardiovascular Management Strategies: Medication therapy, Routine screening  Respiratory Respiratory Symptoms Reported: No symptoms reported Respiratory Self-Management Outcome: 4 (good)  Endocrine Endocrine Symptoms Reported: Blurry vision, Shortness of breath Is patient diabetic?: No Endocrine Self-Management Outcome: 3 (uncertain)  Gastrointestinal Gastrointestinal Symptoms Reported: Reflux/heartburn Gastrointestinal Management Strategies: Medication therapy Gastrointestinal Self-Management Outcome: 4 (good)    Genitourinary Genitourinary Symptoms Reported: No symptoms reported Genitourinary Self-Management Outcome: 4 (good)  Integumentary Integumentary Symptoms Reported: Skin changes Additional Integumentary Details: skin lesion Skin Management Strategies: Routine screening Skin Self-Management Outcome: 3 (uncertain)  Musculoskeletal Musculoskelatal Symptoms Reviewed: Difficulty walking, Limited mobility Additional Musculoskeletal Details: has parkinson Musculoskeletal Management Strategies: Activity, Adequate rest, Routine screening Musculoskeletal Self-Management Outcome: 3 (uncertain)      Psychosocial Psychosocial Symptoms Reported: Sadness - if selected complete PHQ 2-9 Behavioral Management Strategies:  Medication therapy, Counseling Behavioral Health Self-Management Outcome: 3 (uncertain) Major Change/Loss/Stressor/Fears (CP): Medical condition, self, Separation or divorce Behaviors When Feeling Stressed/Fearful: still stays in contact with his ex wife, they still have a good relationship even after she remarried Techniques to [place] with Loss/Stress/Change: Medication, Diversional activities, Counseling Quality of Family Relationships: helpful (reports his brother calls him every day at 9 pm) Do you feel physically threatened by others?: No      04/30/2024   10:43 AM  Depression screen PHQ 2/9  Decreased Interest 0  Down, Depressed, Hopeless 0  PHQ - 2 Score 0  Altered sleeping 0  Tired, decreased energy 0  Change in appetite 0  Feeling bad or failure about yourself  0  Trouble concentrating 0  Moving slowly or fidgety/restless 0  Suicidal thoughts 0  PHQ-9 Score 0  Difficult doing work/chores Not difficult at all    There were no vitals filed for this visit.  Medications Reviewed Today   Medications were not reviewed in this encounter     Recommendation:   PCP Follow-up Continue Current Plan of Care Stay in contact with DSS SW   Follow Up Plan:   Telephone follow up appointment date/time:  03/22/24  Suzen L. Ramonita, RN, BSN, CCM [place]  [facility], [facility] RN Care Manager Direct Dial: [phone]  Fax: [phone]

## 2024-03-15 NOTE — Patient Instructions (Signed)
 Visit Information  Thank you for taking time to visit with me today. Please don't hesitate to contact me if I can be of assistance to you before our next scheduled appointment.  Your next care management appointment is by telephone on 03/22/24     Please call the care guide team at [phone] if you need to cancel, schedule, or reschedule an appointment.   Please call the Suicide and Crisis Lifeline: 988 call the [place]  National Suicide Prevention Lifeline: 251 654 3704 or TTY: (223) 189-3064 TTY 760 769 6990) to talk to a trained counselor call 1-800-273-TALK (toll free, 24 hour hotline) go to [facility] [address] [place] [phone]) call 911 if you are experiencing a Mental Health or Behavioral Health Crisis or need someone to talk to.  Gid Schoffstall L. Ramonita, RN, BSN, CCM [place]  [facility], [facility] RN Care Manager Direct Dial: [phone]  Fax: [phone]

## 2024-03-15 NOTE — Patient Outreach (Signed)
 Complex Care Management   Visit Note  05/09/2024 updated note for 03/01/24  Name:  Shawn Meza MRN: [id number] DOB: [birth year]  Situation: Referral received for Complex Care Management related to SDOH Barriers:  Food insecurity Social Isolation polypharmacy I obtained verbal consent from Patient.  Visit completed with Shawn Meza  on the phone      Background:   Past Medical History:  Diagnosis Date   Abscess of right axilla 12/12/2017   Acute blood loss anemia 10/25/2017   Acute postoperative respiratory insufficiency 10/25/2017   Aortic regurgitation 09/16/2018   Arthritis    BMI 33.0-33.9,adult 12/12/2017   Cardiomyopathy, unspecified (HCC) 06/13/2018   Chest pain in adult 10/11/2017   Chronic coronary artery disease    Chronic systolic (congestive) heart failure (HCC) 09/18/2018   Colon cancer (HCC)    Coronary artery disease involving native coronary artery of native heart with angina pectoris 12/28/2010   Added automatically from request for surgery [id number]     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        GERD (gastroesophageal reflux disease)    Hiatal hernia    History of cardiovascular disorder 05/20/2010   Qualifier: Diagnosis of   By: Joshua MD, Ned CROME.     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        Ischemic cardiomyopathy 09/16/2018   LBBB (left bundle branch block)    Long term current use of aspirin  03/29/2017   Long-term use of aspirin  therapy    Low back pain 10/28/2019   Lumbar  spondylosis 06/29/2022   Major depression, chronic    Major depressive disorder, recurrent episode, moderate (HCC) 06/30/2022   Medial meniscus tear 10/11/2011   Memory change 07/06/2015   Metabolic syndrome    Mixed hyperlipidemia 05/20/2010   Qualifier: Diagnosis of   By: Joshua MD, Ned CROME.        Myofascial pain 12/20/2019   Nephrolithiasis    hx of   NEPHROLITHIASIS, HX OF 05/20/2010   Qualifier: Diagnosis of   By: Joshua MD, Ned CROME.        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   SOB (shortness of breath) 03/31/2017   Transient ischemic attack    hx of   Trochanteric bursitis of right hip     Assessment: Patient Reported Symptoms:  Cognitive Cognitive Status: No symptoms reported Cognitive/Intellectual Conditions Management [RPT]: None reported or documented in medical history or problem list   Health Maintenance Behaviors: Sleep adequate, Healthy diet Healing Pattern: Average Health Facilitated by: Rest  Neurological Neurological Review of Symptoms: Vision changes, Weakness Neurological Management Strategies: Routine screening Neurological Self-Management Outcome: 3 (uncertain)  HEENT HEENT Symptoms Reported: Other:, Eye pain,  Eye dryness HEENT Management Strategies: Routine screening, Medication therapy HEENT Self-Management Outcome: 4 (good) HEENT Comment: clear water sealant got in his eyes    Cardiovascular Cardiovascular Symptoms Reported: Swelling in legs or feet Does patient have uncontrolled Hypertension?: Yes Is patient checking Blood Pressure at home?: Yes Cardiovascular Management Strategies: Medical device, Medication therapy, Routine screening Cardiovascular Self-Management Outcome: 3 (uncertain)   Respiratory Respiratory Symptoms Reported: Shortness of breath Respiratory Management Strategies: Routine screening, Activity, Adequate rest Respiratory Self-Management Outcome: 3 (uncertain)  Endocrine Endocrine Symptoms Reported: Blurry vision, Shortness of breath, Weakness or fatigue Is patient diabetic?: No Endocrine Self-Management Outcome: 3 (uncertain)  Gastrointestinal Gastrointestinal Symptoms Reported: Reflux/heartburn Gastrointestinal Management Strategies: Medication therapy, Adequate rest Gastrointestinal Self-Management Outcome: 4 (good)    Genitourinary Genitourinary Symptoms Reported: Difficulty initiating stream, Frequency Genitourinary Management Strategies: Adequate rest Genitourinary Self-Management Outcome: 3 (uncertain)  Integumentary Integumentary Symptoms Reported: Skin changes Additional Integumentary Details: skin lesion pending dermatology Skin Management Strategies: Routine screening Skin Self-Management Outcome: 3 (uncertain)  Musculoskeletal Musculoskelatal Symptoms Reviewed: Difficulty walking, Limited mobility Musculoskeletal Management Strategies: Activity, Adequate rest, Routine screening Musculoskeletal Self-Management Outcome: 3 (uncertain)      Psychosocial Psychosocial Symptoms Reported: Sadness - if selected complete PHQ 2-9, Depression - if selected complete PHQ 2-9 Behavioral Management Strategies: Adequate rest, Medication therapy, Coping strategies Behavioral Health Self-Management Outcome: 3 (uncertain) Behavioral Health Comment: depression ex wife still living has dementia Major Change/Loss/Stressor/Fears (CP): Medical condition, self, Relationship concerns, Resources, Separation or divorce Techniques to [place] with Loss/Stress/Change: Counseling, Diversional activities, Medication, Withdraw Quality of Family Relationships: non-existent Do you feel physically threatened by others?: No      04/30/2024   10:43 AM  Depression screen PHQ 2/9   Decreased Interest 0  Down, Depressed, Hopeless 0  PHQ - 2 Score 0  Altered sleeping 0  Tired, decreased energy 0  Change in appetite 0  Feeling bad or failure about yourself  0  Trouble concentrating 0  Moving slowly or fidgety/restless 0  Suicidal thoughts 0  PHQ-9 Score 0  Difficult doing work/chores Not difficult at all    There were no vitals filed for this visit.  Medications Reviewed Today     Reviewed by Ramonita Suzen CROME, RN (Registered Nurse) on 03/01/24 at 1438  Med List Status: <None>   Medication Order Taking? Sig Documenting Provider Last Dose Status Informant  acetaminophen  (TYLENOL ) 325 MG tablet [id number] Yes Take 162.5 mg by mouth every 6 (six) hours as needed for mild pain or moderate pain. [provider]  Active   aspirin  81 MG EC tablet [id number] Yes Take 1 tablet (81 mg total) by mouth daily. Frann Mabel Mt, DO  Active   azelastine  (ASTELIN ) 0.1 % nasal spray [id number] Yes Place 2 sprays into both nostrils 2 (two) times daily. Use in each nostril as directed Frann Mabel Mt, DO  Active   beclomethasone (QVAR  REDIHALER) 40 MCG/ACT inhaler [id number]  Inhale 2 puffs into the lungs 2 (two) times daily. Frann Mabel Mt, DO  Active   Carbidopa -Levodopa  ER (RYTARY ) 61.25-245 MG CPCR [id number]  1 four times per day, the last at bedtime [name], Asberry RAMAN, DO  Active   citalopram  (CELEXA ) 20 MG tablet [id number] Yes Take 1 tablet (20 mg total) by mouth daily. Frann Mabel Mt, DO  Active   empagliflozin  (JARDIANCE ) 10 MG TABS tablet [id number]  Take 10 mg by mouth daily. [provider]  Active   esomeprazole  (NEXIUM ) 40 MG capsule [id number]  Take 1 capsule (40 mg total)  by mouth daily. Frann Mabel Mt, DO  Active   fluticasone  (FLONASE ) 50 MCG/ACT nasal spray [id number]  Place 2 sprays into both nostrils daily. Frann Mabel Mt, DO  Active   furosemide  (LASIX ) 20 MG tablet [id number] Yes Take 1 tablet (20 mg  total) by mouth as needed for fluid or edema (shortness of breath). Frann Mabel Mt, DO  Active   gabapentin  (NEURONTIN ) 100 MG capsule [id number]  TAKE 1 CAPSULE (100 MG TOTAL) BY MOUTH AT BEDTIME. NERVE PAIN Frann Mabel Mt, DO  Active   hydrocortisone-pramoxine [facility]) 2.5-1 % rectal cream [id number] Yes 1 appful rectally 3 times a day; Duration: 7 day(s) [provider]  Active   levocetirizine (XYZAL ) 5 MG tablet [id number]  Take 1 tablet (5 mg total) by mouth every evening. For allergies Frann Mabel Mt, DO  Active   metoprolol  tartrate (LOPRESSOR ) 25 MG tablet [id number]  Take 1 tablet (25 mg total) by mouth daily. Frann Mabel Mt, DO  Active   montelukast  (SINGULAIR ) 10 MG tablet [id number]  Take 1 tablet (10 mg total) by mouth at bedtime. Frann Mabel Mt, DO  Active   nitroGLYCERIN  (NITROSTAT ) 0.4 MG SL tablet [id number]  Place 1 tablet (0.4 mg total) under the tongue every 5 (five) minutes x 3 doses as needed for chest pain. If chest pain is not relieved after 2nd dose - call 911. Frann Mabel Mt, DO  Active   pravastatin  (PRAVACHOL ) 80 MG tablet [id number]  Take 1 tablet (80 mg total) by mouth every evening. Carlin Delon BROCKS, NP  Active   sacubitril -valsartan  (ENTRESTO ) 49-51 MG [id number]  TAKE 1 TABLET BY MOUTH 2 TIMES DAILY. Krasowski, Robert J, MD  Active   trimethoprim -polymyxin b  (POLYTRIM ) ophthalmic solution [id number]  Place 1 drop into both eyes every 4 (four) hours. Randol Simmonds, MD  Active             Recommendation:   PCP Follow-up Continue Current Plan of Care  Follow Up Plan:   Telephone follow up appointment date/time:  03/15/24    Suzen L. Ramonita, RN, BSN, CCM [place]  [facility], [facility] RN Care Manager Direct Dial: [phone]  Fax: [phone]

## 2024-03-19 ENCOUNTER — Encounter: Payer: Self-pay | Admitting: Dermatology

## 2024-03-19 ENCOUNTER — Ambulatory Visit: Admitting: Dermatology

## 2024-03-19 VITALS — BP 135/78 | HR 100 | Temp 98.0°F

## 2024-03-19 DIAGNOSIS — C4492 Squamous cell carcinoma of skin, unspecified: Secondary | ICD-10-CM

## 2024-03-19 DIAGNOSIS — C44329 Squamous cell carcinoma of skin of other parts of face: Secondary | ICD-10-CM | POA: Diagnosis not present

## 2024-03-19 DIAGNOSIS — L814 Other melanin hyperpigmentation: Secondary | ICD-10-CM

## 2024-03-19 DIAGNOSIS — L578 Other skin changes due to chronic exposure to nonionizing radiation: Secondary | ICD-10-CM | POA: Diagnosis not present

## 2024-03-19 DIAGNOSIS — C44320 Squamous cell carcinoma of skin of unspecified parts of face: Secondary | ICD-10-CM

## 2024-03-19 NOTE — Progress Notes (Signed)
 Follow-Up Visit   Subjective  Shawn Meza is a 78 y.o. 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 Frann.   The following portions of the chart were reviewed this encounter and updated as appropriate: medications, allergies, medical history  Review of Systems:  No other skin or systemic complaints except as noted in HPI or Assessment and Plan.  Objective  Well appearing patient in no apparent distress; mood and affect are within normal limits.  A focused examination was performed of the following areas: Right side of face Relevant physical exam findings are noted in the Assessment and Plan.   right side of face Healing biopsy site   Assessment & Plan   SQUAMOUS CELL CARCINOMA OF SKIN right side of face Mohs surgery  Consent obtained: written  Anticoagulation: Was the anticoagulation regimen changed prior to Mohs? No    Anesthesia: Anesthesia method: local infiltration Local anesthetic: lidocaine  1% WITH epi  Procedure Details: Timeout: pre-procedure verification complete Procedure Prep: patient was prepped and draped in usual sterile fashion Biopsy accession number: [id number] Pre-Op diagnosis: squamous cell carcinoma SCC subtype: moderately differentiated MohsAIQ Surgical site (if tumor spans multiple areas, please select predominant area): temple Surgical site (from skin exam): right side of face Pre-operative length (cm): 0.7 Pre-operative width (cm): 0.6 Indications for Mohs surgery: anatomic location where tissue conservation is critical and aggressive histology  Micrographic Surgery Details: Post-operative length (cm): 1.6 Post-operative width (cm): 1.5 Number of Mohs stages: 1 Cumulative additional sections past 5 per stage: 0 Post surgery depth of defect: subcutaneous fat  Stage 1    Tumor features identified on Mohs section: no tumor  identified  Reconstruction: Was the defect reconstructed? Yes   Was reconstruction performed by the same Mohs surgeon? Yes   Setting of reconstruction: outpatient office When was reconstruction performed? same day Type of reconstruction: linear  Skin repair Complexity:  Complex Final length (cm):  4.8 Informed consent: discussed and consent obtained   Timeout: patient name, date of birth, surgical site, and procedure verified   Procedure prep:  Patient was prepped and draped in usual sterile fashion Prep type:  Chlorhexidine  Anesthesia: the lesion was anesthetized in a standard fashion   Anesthetic:  1% lidocaine  w/ epinephrine  1-100,000 buffered w/ 8.4% NaHCO3 Reason for type of repair: reduce tension to allow closure, reduce subcutaneous dead space and avoid a hematoma, allow closure of the large defect, preserve normal anatomy and allow side-to-side closure without requiring a flap or graft   Undermining: area extensively undermined   Subcutaneous layers (deep stitches):  Suture size:  5-0 Suture type: Monocryl (poliglecaprone 25)   Stitches:  Buried vertical mattress Fine/surface layer approximation (top stitches):  Suture size:  6-0 Suture type: fast-absorbing plain gut   Stitches: simple running   Hemostasis achieved with: suture, pressure and electrodesiccation Outcome: patient tolerated procedure well with no complications   Post-procedure details: sterile dressing applied and wound care instructions given   Dressing type: petrolatum     Patient mentioned having a reaction to the numbing medicine used during his biopsy, where he was unable to open bilateral eyelids for several hours. Diluted the lidocaine  with epinephrine  1% by a quarter to lidocaine  with epinephrine  0.25%. Patient tolerated the anesthesia well.   Return in about 4 weeks (around 04/16/2024) for wound check.  I, Darice Smock, CMA, am acting as scribe for Shawn CHRISTELLA HOLY, MD.  03/19/2024  HISTORY OF  PRESENT ILLNESS  Shawn Meza is seen in consultation at the request of Dr. Frann for biopsy-proven Moderately Differentiated Squamous Cell Carcinoma of the right side of the face/temple. They note that the area has been present for about 6 months increasing in size with time.  There is no history of previous treatment.  Reports no other new or changing lesions and has no other complaints today.  Medications and allergies: see patient chart.  Review of systems: Reviewed 8 systems and notable for the above skin cancer.  All other systems reviewed are unremarkable/negative, unless noted in the HPI. Past medical history, surgical history, family history, social history were also reviewed and are noted in the chart/questionnaire.    PHYSICAL EXAMINATION  General: Well-appearing, in no acute distress, alert and oriented x 4. Vitals reviewed in chart (if available).   Skin: Exam reveals a 0.7 x 0.6 cm erythematous papule and biopsy scar on the right temple/side of face. There are rhytids, telangiectasias, and lentigines, consistent with photodamage.   Biopsy report(s) reviewed, confirming the diagnosis.   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 Due to location, size, histology, or recurrence and the likelihood of subclinical extension as well as the need to conserve normal surrounding tissue, the patient was deemed acceptable for Mohs micrographic surgery (MMS).  The nature and purpose of the procedure, associated benefits and risks including recurrence and scarring, possible complications such as pain, infection, and bleeding, and alternative methods of treatment if appropriate were discussed with the patient during consent. The lesion location was verified by the patient, by reviewing previous notes, pathology reports, and by photographs as well as angulation measurements if available.   Informed consent was reviewed and signed by the patient, and timeout was performed at 9:30 AM. See op note below.  2. For the photodamage and solar lentigines, sun protection discussed/information given on OTC sunscreens, and we recommend continued regular follow-up with primary dermatologist every 6 months or sooner for any growing, bleeding, or changing lesions. 3. Prognosis and future surveillance discussed. 4. Letter with treatment outcome sent to referring provider. 5. Pain acetaminophen /ibuprofen  MOHS MICROGRAPHIC SURGERY AND RECONSTRUCTION  Initial size:   0.7 x 0.6 cm Surgical defect/wound size: 1.6 x 1.5 cm Anesthesia:    0.33% lidocaine  with 1:200,000 epinephrine  EBL:    <5 mL Complications:  None Repair type:   Complex SQ suture:   5-0 Monocryl Cutaneous suture:  6-0 Plain gut Final size of the repair: 4.8 cm  Stages: 1  STAGE I: Anesthesia achieved with 0.5% lidocaine  with 1:200,000 epinephrine . ChloraPrep applied. 1 section(s) excised using Mohs technique (this includes total peripheral and deep tissue margin excision and evaluation with frozen sections, excised and interpreted by the same physician). The tumor was first debulked and then excised with an approx. 2mm margin.  Hemostasis was achieved with electrocautery as needed.  The specimen was then oriented, subdivided/relaxed, inked, and processed using Mohs technique.    Frozen section analysis revealed a clear deep and peripheral margin.   Reconstruction  The surgical wound was then cleaned, prepped, and re-anesthetized as above. Wound edges were undermined extensively along at least one entire edge and at a distance equal to or greater than the width of the defect (see wound defect size above) in order to achieve closure and decrease wound tension and anatomic distortion. Redundant tissue repair including standing  cone removal was performed. Hemostasis was achieved with electrocautery. Subcutaneous and epidermal  tissues were approximated with the above sutures. The surgical site was then lightly scrubbed with sterile, saline-soaked gauze. The area was then bandaged using Vaseline ointment, non-adherent gauze, gauze pads, and tape to provide an adequate pressure dressing. The patient tolerated the procedure well, was given detailed written and verbal wound care instructions, and was discharged in good condition.   The patient will follow-up: 4 weeks.    Documentation: I have reviewed the above documentation for accuracy and completeness, and I agree with the above.  Shawn CHRISTELLA HOLY, MD

## 2024-03-19 NOTE — Patient Instructions (Signed)

## 2024-03-21 ENCOUNTER — Other Ambulatory Visit: Payer: Self-pay

## 2024-03-21 ENCOUNTER — Telehealth: Payer: Self-pay | Admitting: Family Medicine

## 2024-03-21 ENCOUNTER — Encounter: Payer: Self-pay | Admitting: *Deleted

## 2024-03-21 DIAGNOSIS — C4492 Squamous cell carcinoma of skin, unspecified: Secondary | ICD-10-CM

## 2024-03-21 NOTE — Patient Outreach (Signed)
 03/21/24 Collaboration with patient and Dermatologist office staff- Bandage change  Completed conference calls with the patient to various providers and agencies  Mr Dunnavant had a dermatology appointment recently to have a squamous cell carcinoma on the right side of his face removed. The  dressing placed was ordered to be removed within 24-48 hours (today)  Mr Aldous shares more information than earlier today.  1) he is cautious to remove the bandage by himself as he is fearful it may bleed and it is in an area that is hard to see, maneuver a mirror by himself to re dress 2) he is reporting some change in his vision since this office visit with occasional dizziness and pain of his head, therefore, this is preventing him from attempting to get to a medical provider's office. 3) even if he could he has car problems preventing him from getting to medical provider's office   4) he has a limited support system that will not be able to assist related to mental changes  RN had called the pcp to assist with home health RN services earlier but this process may be delayed until next week. He agrees for RN CM to outreach to his dermatologist office to review these concerns Rollene (dermatology RN), the patient and RN CM came up with possible options 1) continue with orders and the process to get home health RN visits 2) inquire if the local fire department/EMT/paramedics could assist him 3) seek assist with an uber to get to [facility] on [address], [employer] call to the fire department ([place])/EMTs were beneficial - [place] EMT agrees to assist the patient this evening, ([phone] station [phone],  [place])       Plan: follow up 03/22/24  Suzen L. Ramonita, RN, BSN, CCM [place]  [facility], [facility] RN Care Manager Direct Dial: [phone]  Fax: [phone]

## 2024-03-21 NOTE — Patient Outreach (Signed)
 Incoming call from patient/collaboration with pcp office   Mr Digilio called to ask to have someone visit him to remove his dressing  RN CCM outreached to his pcp office & sent an in basket clinical pool message to inquire about this and possible order for home health RN services  Plan follow up on 03/22/24  Lezlie Ritchey L. Ramonita, RN, BSN, CCM [place]  [facility], [facility] RN Care Manager Direct Dial: [phone]  Fax: [phone]

## 2024-03-21 NOTE — Telephone Encounter (Signed)
 Copied from CRM [id number]. Topic: Referral - Question >> Mar 21, 2024 12:40 PM Deleta RAMAN wrote: Reason for CRM: Luke Griffiths is calling stating the patient is requesting home health services please contact the patient at [phone]

## 2024-03-21 NOTE — Progress Notes (Signed)
 Phone call received from Care Management Suzen Griffiths, RN. Patient is requesting an referral to home health so that his wound can be dressed. To to visual limitations the patient is not able to dress his wound himself, and he does not have anyone at home or available to assist him. The patient is also having car trouble so he is not able to come to the office for a dressing change. The patient mentioned that he could call the fire department and have them come to his house. We also discussed taking an uber to the nearest urgent care.  I spoke with Dr. Corey who does not want the patient to go to urgent care. She is okay with the bandage staying on until Monday if it is not saturated. At that time we can get him an uber for a bandage change if home health has not been initiated yet.  I reached back out to [place] and Mr. Collingsworth and advised that he should not go to urgent care. They have contacted EMS and they are on the way to his house to perform a dressing change. The patient states that he does not have his post operative instructions that were provided after surgery. I advised Suzen and him of the instructions so that they can advise EMS when they arrive.  I advised that he should keep the bandage that EMS applies on until he hears from home health or our office.  I have placed an urgent order to home health with the request that initiate care tomorrow.

## 2024-03-22 ENCOUNTER — Other Ambulatory Visit: Payer: Self-pay

## 2024-03-22 ENCOUNTER — Other Ambulatory Visit: Payer: Self-pay | Admitting: *Deleted

## 2024-03-22 DIAGNOSIS — Z741 Need for assistance with personal care: Secondary | ICD-10-CM

## 2024-03-22 NOTE — Patient Outreach (Addendum)
 Complex Care Management   Visit Note  03/22/2024  Name:  Shawn Meza MRN: [id number] DOB: 05/09/1946  Situation: Referral received for Complex Care Management related to SDOH Barriers:  Food insecurity Social Isolation polypharmacy I obtained verbal consent from Patient.  Visit completed with Patient  on the phone  Follow up with Shawn Meza today about his dermatology head wound  He confirms it was treated/dressing change by his local EMT on March 21, 2024  He reports that they stated it was looking pretty good  He denies any other complaints today  He made inquiries about pending CAP services and the process of getting Medicaid for his ex-wife  He has been spending some time with his ex-wife per previous suggestion from [facility]  He reports spending time with her + his counseling session with Alm MATSU is helping He has CAPS forms that he needs help completing and request assistance with the forms  He agrees to make contact with RN CM for this assistance   Background:   Past Medical History:  Diagnosis Date   Abscess of right axilla 12/12/2017   Acute blood loss anemia 10/25/2017   Acute postoperative respiratory insufficiency 10/25/2017   Aortic regurgitation 09/16/2018   Arthritis    BMI 33.0-33.9,adult 12/12/2017   Cardiomyopathy, unspecified (HCC) 06/13/2018   Chest pain in adult 10/11/2017   Chronic coronary artery disease    Chronic systolic (congestive) heart failure (HCC) 09/18/2018   Colon cancer (HCC)    Coronary artery disease involving native coronary artery of native heart with angina pectoris (HCC) 12/28/2010   Added automatically from request for surgery [id number]     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        GERD (gastroesophageal reflux disease)    Hiatal hernia    History of cardiovascular disorder 05/20/2010   Qualifier: Diagnosis of   By: Joshua MD, Ned CROME.     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        Ischemic cardiomyopathy 09/16/2018   LBBB (left bundle branch block)    Long term current use of aspirin  03/29/2017   Long-term use of aspirin  therapy    Low back pain 10/28/2019   Lumbar spondylosis 06/29/2022   Major depression, chronic    Major depressive disorder, recurrent episode, moderate (HCC) 06/30/2022   Medial meniscus tear 10/11/2011   Memory change 07/06/2015   Metabolic syndrome    Mixed hyperlipidemia 05/20/2010   Qualifier: Diagnosis of   By: Joshua MD, Ned CROME.        Myofascial pain 12/20/2019   Nephrolithiasis    hx of   NEPHROLITHIASIS, HX OF 05/20/2010   Qualifier: Diagnosis of   By: Joshua MD, Ned CROME.        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   SOB (shortness of breath) 03/31/2017   Transient ischemic attack    hx of   Trochanteric bursitis of right hip     Assessment: Patient Reported Symptoms:  Cognitive Cognitive Status: No symptoms reported      Neurological Neurological Review of Symptoms: Vision changes Neurological Self-Management Outcome: 3 (uncertain)  HEENT HEENT Symptoms Reported: Sudden change or loss of vision HEENT Self-Management Outcome: 3 (uncertain)    Cardiovascular Cardiovascular Symptoms Reported: Not assessed    Respiratory Respiratory  Symptoms Reported: Not assesed    Endocrine Endocrine Symptoms Reported: Not assessed    Gastrointestinal Gastrointestinal Symptoms Reported: Not assessed      Genitourinary Genitourinary Symptoms Reported: Not assessed    Integumentary Integumentary Symptoms Reported: Wound, Skin changes Additional Integumentary Details: confirmed he had assistance from his local EMT to change his dressing on 03/21/24 He reports that the site looks fine Skin Management Strategies: Routine screening, Adequate rest, Dressing changes Skin Self-Management Outcome: 4 (good)  Musculoskeletal Musculoskelatal Symptoms Reviewed: Not assessed   Falls in the past year?: No Number of falls in past year: 1 or less Was there an injury with Fall?: No Fall Risk Category Calculator: 0 Patient Fall Risk Level: Low Fall Risk Patient at Risk for Falls Due to: Impaired balance/gait, Impaired mobility Fall risk Follow up: Falls evaluation completed  Psychosocial Additional Psychological Details: patient confirmed he took RN CM suggestion and has started spending more time with his ex wife and this has benefited him and her (she has memory issues) He will speak with his counselor on 03/27/24 2 pm Behavioral Management Strategies: Adequate rest, Coping strategies, Counseling, Support system, Medication therapy Behavioral Health Self-Management Outcome: 4 (good) Behavioral Health Comment: improving behavior reported today        03/22/2024    PHQ2-9 Depression Screening   Little interest or pleasure in doing things Several days  Feeling down, depressed, or hopeless Several days  PHQ-2 - Total Score 2  Trouble falling or staying asleep, or sleeping too much Several days  Feeling tired or having little energy Several days  Poor appetite or overeating  Several days  Feeling bad about yourself - or that you are a failure or have let yourself or your family down Several days  Trouble concentrating on things, such as reading  the newspaper or watching television Not at all  Moving or speaking so slowly that other people could have noticed.  Or the opposite - being so fidgety or restless that you have been moving around a lot more than usual Not at all  Thoughts that you would be better off dead, or hurting yourself in some way Not at all  PHQ2-9 Total Score 6  If you checked off any problems, how difficult have these problems made it for you to do your work, take care of things at home, or get along with other people Somewhat difficult  Depression Interventions/Treatment Currently on Treatment, Counseling, Medication (increase interaction with his ex wife)    There were no vitals filed for this visit.  Medications Reviewed Today   Medications were not reviewed in this encounter     Recommendation:   PCP Follow-up Continue Current Plan of Care Try to speak with Alm MATSU.   Follow Up Plan:   Telephone follow up appointment date/time:  03/27/24 3:30 pm  Suzen L. Ramonita, RN, BSN, CCM [place]  [facility], [facility] Health RN Care Manager Direct Dial: [phone]  Fax: [phone]

## 2024-03-22 NOTE — Telephone Encounter (Signed)
 Referral has been placed already by another provider.

## 2024-03-25 ENCOUNTER — Ambulatory Visit (INDEPENDENT_AMBULATORY_CARE_PROVIDER_SITE_OTHER): Admitting: Pharmacist

## 2024-03-25 ENCOUNTER — Other Ambulatory Visit: Payer: Self-pay | Admitting: Family Medicine

## 2024-03-25 DIAGNOSIS — Z79899 Other long term (current) drug therapy: Secondary | ICD-10-CM

## 2024-03-25 DIAGNOSIS — F411 Generalized anxiety disorder: Secondary | ICD-10-CM

## 2024-03-25 DIAGNOSIS — F339 Major depressive disorder, recurrent, unspecified: Secondary | ICD-10-CM

## 2024-03-25 DIAGNOSIS — G20A1 Parkinson's disease without dyskinesia, without mention of fluctuations: Secondary | ICD-10-CM

## 2024-03-25 NOTE — Progress Notes (Signed)
 Pharmacy Note  03/25/2024 Name: Shawn Meza MRN: [id number] DOB: 1946/06/12  Subjective: Shawn Meza is a 78 y.o. year old male who is a primary care patient of Shawn Mabel Mt, DO. Clinical Pharmacist Practitioner referral was placed to assist with medication management.    Engaged with patient by telephone for follow up visit today.  He asks today about Flex Seal which is the sealing spray that he accidentally sprayed in his eyes in July 2025. He was seen 1 or 2 days after incident in the ER and was prescribed an eye drop to help with conjunctivitis / irritation. He denied redness but states he feels that his eyes still feel gummy and has a little irritation. He asks about getting another eye drop.   He also was seen by dermatologist for Mohs procedure 03/19/2024. He is not sure when he would be able to get this area wet.   Upcoming appointments:  Dermatologist - Dr Corey - 04/16/2024 Neurologist - Dr [name] - 04/23/2024 PCP / Shawn - not currently scheduled - Last office visit was 01/12/2024  Medication Management:  Patient has been noted in the past to take medications inconsistently. He reports he is taking medications as prescribed today. He has missed about 2 or 3 doses in the last month - per patient. He asks for assistance in ordering from his pharmacy.   He does report that he is taking Jardiance  regularly now.   Patient is using weekly pill container to help with remembering to take medication.  He also asks for pharmacy to print what medication is for on the label and uses the chart that I prepared for him.   Depression / GAD:  Current therapy: citalopram  20mg  daily.  Patient reports fewer mood swings and anger episodes. He is taking at night because he feels the citalopram  makes him sleepy.  He is seeing counselor regularly.   CHF:  ECHO 02/24/2023 - EF improved but still low at 30%. Blood pressure has been at goal at last 3 office visits.   Current  therapy:  Jardiance  10mg  daily, furosemide  20mg  daily if needed; metoprolol  tartrate 25mg  daily and Entresto  - 49/51mg   twice a day,   Patient denies shortness of breath or increase in weight.  He is checking weight most days but has not been recording. Patient reports weight has been stable.  Reports occsional edema in lower extremities that is relieved with as needed furosemide   Parkinson's Disease:  Current therapy - Rytary  4 times a day (dose increased 10/18/23 after his last appointment with Dr [name]).    Objective: Review of patient status, including review of consultants reports, laboratory and other test data, was performed as part of comprehensive.  Lab Results  Component Value Date   CREATININE 0.98 02/26/2024   CREATININE 0.90 01/12/2024   CREATININE 1.21 11/29/2023    Lab Results  Component Value Date   HGBA1C 5.8 11/28/2022       Component Value Date/Time   CHOL 198 11/29/2023 1621   CHOL 163 03/31/2022 1708   TRIG 111.0 11/29/2023 1621   TRIG 107 05/09/2010 0000   HDL 48.90 11/29/2023 1621   HDL 36 (L) 03/31/2022 1708   CHOLHDL 4 11/29/2023 1621   VLDL 22.2 11/29/2023 1621   LDLCALC 127 (H) 11/29/2023 1621   LDLCALC 91 03/31/2022 1708   LDLDIRECT 112 (H) 09/28/2023 1209     Clinical ASCVD: Yes  The ASCVD Risk score (Arnett DK, et al., 2019) failed to calculate  for the following reasons:   Risk score cannot be calculated because patient has a medical history suggesting prior/existing ASCVD    BP Readings from Last 3 Encounters:  03/19/24 135/78  02/26/24 106/61  02/06/24 130/70     Allergies  Allergen Reactions   Tizanidine  Hcl Hives   Fluoxetine  Other (See Comments)    Caused depression and aggression   Rosuvastatin  Other (See Comments)    Whole body aches   Testosterone Other (See Comments)    ABDOMINAL PAIN and cramping   Ropinirole  Hcl Nausea Only   Requip  [Ropinirole ] Nausea Only    Medications Reviewed Today   Medications were not  reviewed in this encounter     Patient Active Problem List   Diagnosis Date Noted   LBBB (left bundle branch block)    Major depressive disorder, recurrent episode, moderate (HCC) 06/30/2022   Lumbar spondylosis 06/29/2022   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   Diarrhea 11/04/2020   Rectal bleeding 11/04/2020   Trochanteric bursitis of right hip    Transient ischemic attack    NSTEMI (non-ST elevated myocardial infarction) (HCC)    Nephrolithiasis    Metabolic syndrome    Major depression, chronic    Long-term use of aspirin  therapy    Hypercholesterolemia    Hiatal hernia    GERD (gastroesophageal reflux disease)    Essential tremor    Essential hypertension    Erectile dysfunction    Elevated PSA    Colon cancer (HCC)    Chronic coronary artery disease    Arthritis    Myofascial pain 12/20/2019   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   Low back pain 10/28/2019   Radiculopathy, lumbar region 10/28/2019   Fatigue 01/15/2019   Chronic systolic (congestive) heart failure (HCC) 09/18/2018   Ischemic cardiomyopathy 09/16/2018   Aortic regurgitation 09/16/2018   Cardiomyopathy, unspecified (HCC) 06/13/2018   Abscess of right axilla 12/12/2017   BMI 33.0-33.9,adult 12/12/2017   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   Acute blood loss anemia 10/25/2017   Acute postoperative respiratory insufficiency 10/25/2017   Postoperative delirium 10/25/2017   Dyslipidemia 10/17/2017   Chest pain in adult 10/11/2017   SOB (shortness of breath) 03/31/2017   Long term current use of aspirin  03/29/2017   Parkinson's disease (HCC) 01/02/2017   Memory change 07/06/2015   Depression, recurrent (HCC) 07/06/2015   Medial meniscus tear 10/11/2011   Neuroma of foot 10/11/2011   Coronary artery disease involving native coronary artery of native heart with angina pectoris (HCC) 12/28/2010   OTHER TESTICULAR HYPOFUNCTION 05/20/2010   Mixed hyperlipidemia 05/20/2010    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/20/2010   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 05/20/2010   GERD 05/20/2010   History of cardiovascular disorder 05/20/2010   NEPHROLITHIASIS, HX OF 05/20/2010   BENIGN PROSTATIC HYPERTROPHY, HX OF, S/P TURP 05/20/2010     Medication Assistance:  None required.  Patient affirms current coverage meets needs. (Has [facility] and Medicaid coverage)   Medication List for Shawn Meza (DOB: 1945/11/19) Updated 02/21/2024 Medication Reason for taking 30 minutes before breakfast Morning / with breakfast Lunch Dinner Bedtime  Esomeprazole /Nexium  40mg  capsule Acid reflux 1 capsule      Rytary  61.5/245 mg Parkinson's  1 capsule 1 capsule 1 capsule 1 capsule  Entresto   Heart  1 tablet  1 tablet   Jardiance  10mg  Heart/ kidney protection  1 tablet     Metoprolol  25mg   Heart / blood pressure  1 tablet     Pravastatin  80mg   Heart / cholesterol        Citalopram  20mg  Mood     1 tablet  Gabapentin  100mg  Nerve / leg pain at night     1 capsule  Montelukast  10mg  Allergies / asthma     1 tablet  Levocetirizine 5mg  Allergies     1 tablet  Pravastatin  80mg   Heart / cholesterol     1 tablet           Fluticasone  nasal spray (Flonase )  Allergic rhinitis / runny nose  2 sprays in each nostril     Azelastine  0.1% nasal spray (Astelin ) Runny nose  2 sprays in each nostril   2 sprays in each nostril  Qvar  inhaler Breathing / asthma  2 inhalations into lungs   2 inhalations into lungs            Medications to take if needed   Tylenol  325mg       Take 0.5 to 1 tablet at bedtime as needed for sleep  Nitroglycerin  tablets  If having chest pain, place 1 tablet under your tongue and allow to dissolve, if chest pain continues can report for 2 more doses but call 911 immediate if chest pain does not improve with 2nd dose.    Furosemide  20mg  Heart / swelling Check weight every day and record in a notebook. Take furosemide  if you notice an  increase of 3 or more pounds in 24 hours or 5 pounds in a week OR if you have shortness of breath, swelling in your legs or ankles      Assessment / Plan: CHF / hypertension:  Last 3 office BPs have been well controlled.  Continue current medication therapy - Jardiance  10mg , Entresto , metoprolol  25mg  once a day and furosemide  20mg  daily as needed.  Parkinson's: Continue Rytary  ER per Dr Collie directions. Coordinated refill and delivery of Rytary  for patient from his pharmacy.   Depression:  Continue citalopram  to 20mg  daily. Coordinated refill and delivery of citalopram  for patient from his pharmacy.  Continue to see counselor and psychiatrist.   Medication management  Reviewed and updated medication list Reviewed refill history and adherence.  Encouraged patient to use weekly pill container to help with medication adherance.  He feels his current medication container is helping. Requested needed meds from [facility] at patient request - Rytary , citalopram  and QVar  inhaler.   Recommended patient call his optometrist or I also offered appointment to see Dr Shawn to check his eyes. He can use something for dry eyes until he gets into see someone - lubricating eye drops, Systane or Lumify.  Recommended he call dermatology office to ask about when he can get area on his scalp wet post Mohs procedure.   Follow Up:  4 to 6 weeks   Shawn Meza, PharmD Clinical Pharmacist [facility]  - [facility] [phone]

## 2024-03-27 ENCOUNTER — Other Ambulatory Visit: Payer: Self-pay | Admitting: *Deleted

## 2024-03-27 ENCOUNTER — Ambulatory Visit (INDEPENDENT_AMBULATORY_CARE_PROVIDER_SITE_OTHER): Admitting: Psychology

## 2024-03-27 DIAGNOSIS — F419 Anxiety disorder, unspecified: Secondary | ICD-10-CM

## 2024-03-27 DIAGNOSIS — F331 Major depressive disorder, recurrent, moderate: Secondary | ICD-10-CM

## 2024-03-27 DIAGNOSIS — F329 Major depressive disorder, single episode, unspecified: Secondary | ICD-10-CM
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
## 2024-04-04 ENCOUNTER — Telehealth: Payer: Self-pay | Admitting: Family Medicine

## 2024-04-04 NOTE — Telephone Encounter (Signed)
 Copied from CRM [id number]. Topic: General - Other >> Apr 04, 2024 11:19 AM Carlyon D wrote: Reason for CRM: pt calling in regards to paper work he dropped off last week Wednesday for Dr. Frann to fill out . Pt would like to know if his paper work has been faxed over to the health department? Please call pt back.

## 2024-04-05 ENCOUNTER — Telehealth: Payer: Self-pay

## 2024-04-05 NOTE — Telephone Encounter (Signed)
 Copied from CRM #[id number]. Topic: General - Other >> Apr 05, 2024 10:14 AM [name] wrote: Reason for CRM: Follow Up On Documents >> Apr 05, 2024 10:16 AM [name] wrote: Patient called in wanting a follow up on the documents he dropped off on last Wednesday for Shawn Mabel Mt, DO to complete and send out.

## 2024-04-08 NOTE — Telephone Encounter (Signed)
 Called pt was advised he needs to sign and we will get it faxed. Pt is coming in to sign.

## 2024-04-10 ENCOUNTER — Ambulatory Visit (INDEPENDENT_AMBULATORY_CARE_PROVIDER_SITE_OTHER): Admitting: Psychology

## 2024-04-10 DIAGNOSIS — F331 Major depressive disorder, recurrent, moderate: Secondary | ICD-10-CM

## 2024-04-10 DIAGNOSIS — F329 Major depressive disorder, single episode, unspecified: Secondary | ICD-10-CM

## 2024-04-10 DIAGNOSIS — F419 Anxiety disorder, unspecified: Secondary | ICD-10-CM
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
## 2024-04-10 NOTE — Progress Notes (Deleted)
 Psychiatric Initial Adult Assessment   Patient Identification: Shawn Meza MRN:  [id number] Date of Evaluation:  04/10/2024 Referral Source: PCP Chief Complaint:  No chief complaint on file.  Visit Diagnosis: No diagnosis found.   Assessment:  Shawn Meza is a 78 y.o. male with a history of MDD who presents in person to [facility] for initial evaluation on 04/10/2024.    At initial evaluation patient reports ***  A number of assessments were performed during the evaluation today including  PHQ-9 which they scored a *** on, GAD-7 which they scored a *** on, and [place] suicide severity screening which showed ***.    Risk Assessment: A suicide and violence risk assessment was performed as part of this evaluation. There patient is deemed to be at chronic elevated risk for self-harm/suicide given the following factors: {SABSUICIDERISKFACTORS:29780}. These risk factors are mitigated by the following factors: {SABSUICIDEPROTECTIVEFACTORS:29779}. The patient is deemed to be at chronic elevated risk for violence given the following factors: {SABVIOLENCERISKFACTORS:29781}. These risk factors are mitigated by the following factors: {SABVIOLENCEPROTECTIVEFACTORS:29782}. There is no *** acute risk for suicide or violence at this time. The patient was educated about relevant modifiable risk factors including following recommendations for treatment of psychiatric illness and abstaining from substance abuse.  While future psychiatric events cannot be accurately predicted, the patient does not *** currently require  acute inpatient psychiatric care and does not *** currently meet [place]  involuntary commitment criteria.  Patient was given contact information for crisis resources, behavioral health clinic and was instructed to call 911 for emergencies.    Plan: # *** Past medication trials:  Status of problem: *** Interventions: -- ***  # *** Past  medication trials:  Status of problem: *** Interventions: -- ***  # *** Past medication trials:  Status of problem: *** Interventions: -- ***   History of Present Illness:  ***  Associated Signs/Symptoms: Depression Symptoms:  {DEPRESSION SYMPTOMS:20000} (Hypo) Manic Symptoms:  {BHH MANIC SYMPTOMS:22872} Anxiety Symptoms:  {BHH ANXIETY SYMPTOMS:22873} Psychotic Symptoms:  {BHH PSYCHOTIC SYMPTOMS:22874} PTSD Symptoms: {BHH PTSD SYMPTOMS:22875}  Past Psychiatric History:  Past psychiatric diagnoses: MDD, anxiety Psychiatric hospitalizations:*** Past suicide attempts: *** Hx of self harm: *** Hx of violence towards others: *** Prior psychiatric providers: *** Prior therapy: *** Access to firearms: ***  Prior medication trials: Citalopram , gabapentin   Substance use: ***  Past Medical History:  Past Medical History:  Diagnosis Date   Abscess of right axilla 12/12/2017   Acute blood loss anemia 10/25/2017   Acute postoperative respiratory insufficiency 10/25/2017   Aortic regurgitation 09/16/2018   Arthritis    BMI 33.0-33.9,adult 12/12/2017   Cardiomyopathy, unspecified (HCC) 06/13/2018   Chest pain in adult 10/11/2017   Chronic coronary artery disease    Chronic systolic (congestive) heart failure (HCC) 09/18/2018   Colon cancer (HCC)    Coronary artery disease involving native coronary artery of native heart with angina pectoris (HCC) 12/28/2010   Added automatically from request for surgery [id number]     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        GERD (gastroesophageal reflux disease)  Hiatal hernia    History of cardiovascular  disorder 05/20/2010   Qualifier: Diagnosis of   By: Joshua MD, Debby CROME.     IMO SNOMED Dx Update Oct 2024     Hypercholesterolemia    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/20/2010   Qualifier: Diagnosis of   By: Joshua MD, Debby CROME.        Ischemic cardiomyopathy 09/16/2018   LBBB (left bundle branch block)    Long term current use of aspirin  03/29/2017   Long-term use of aspirin  therapy    Low back pain 10/28/2019   Lumbar spondylosis 06/29/2022   Major depression, chronic    Major depressive disorder, recurrent episode, moderate (HCC) 06/30/2022   Medial meniscus tear 10/11/2011   Memory change 07/06/2015   Metabolic syndrome    Mixed hyperlipidemia 05/20/2010   Qualifier: Diagnosis of   By: Joshua MD, Debby CROME.        Myofascial pain 12/20/2019   Nephrolithiasis    hx of   NEPHROLITHIASIS, HX OF 05/20/2010   Qualifier: Diagnosis of   By: Joshua MD, Debby CROME.        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   SOB (shortness of breath) 03/31/2017   Transient ischemic attack    hx of   Trochanteric bursitis of right hip     Past Surgical History:  Procedure Laterality Date   CARDIAC CATHETERIZATION  5/12,1/13   4 stents placed   COLON SURGERY     CORONARY ARTERY BYPASS GRAFT     KNEE ARTHROSCOPY  10/11/2011   Procedure: ARTHROSCOPY KNEE;  Surgeon: Lamar DELENA Millman, MD;  Location: [facility];  Service: Orthopedics;  Laterality: Left;  Left Knee Arthroscopy with Medial and Lateral Partial Menisectomy, Chondroplasty   LEFT HEART CATHETERIZATION WITH CORONARY ANGIOGRAM N/A 08/25/2011   Procedure: LEFT HEART CATHETERIZATION WITH CORONARY ANGIOGRAM;  Surgeon: Lonni JONETTA Cash, MD;  Location:  [facility] CATH LAB;  Service: Cardiovascular;  Laterality: N/A;   LITHOTRIPSY     STERIOD INJECTION  10/11/2011   Procedure: STEROID INJECTION;  Surgeon: Lamar DELENA Millman, MD;  Location: [facility];  Service: Orthopedics;  Laterality: Right;  Steroid Injection Second Toe   TRANSURETHRAL RESECTION OF PROSTATE     URETHRAL DILATION      Family Psychiatric History: ***  Family History:  Family History  Problem Relation Age of Onset   Heart disease Mother    Heart disease Father    Hyperlipidemia Father    Stroke Father    Hyperlipidemia Brother    Heart disease Brother    Coronary artery disease Other        family hx of male 1st degree relative ,80   Hyperlipidemia Other        family hx of   Hypertension Other        family hx of   Arthritis Other        family hx of   Healthy Daughter    Dementia Neg Hx     Social History:   Social History   Socioeconomic History   Marital status: Divorced    Spouse name: Not on file   Number of children: 1  Years of education: 47   Highest education level: High school graduate  Occupational History   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  Tobacco Use   Smoking status: Never    Passive exposure: Never   Smokeless tobacco: Never  Vaping Use   Vaping status: Never Used  Substance and Sexual Activity   Alcohol use: No   Drug use: No   Sexual activity: Not Currently  Other Topics Concern   Not on file  Social History Narrative   Divorced 2016   Caffeine use: none    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, [employer]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   Social Drivers of Health   Financial Resource Strain: Medium Risk (01/26/2024)   Overall Financial Resource Strain (CARDIA)    Difficulty of Paying Living Expenses: Somewhat hard  Food Insecurity: Food Insecurity Present (02/12/2024)   Hunger Vital Sign    Worried About Running Out of Food in the Last Year: Sometimes true    Ran Out of Food in the Last Year: Sometimes true  Transportation  Needs: No Transportation Needs (02/12/2024)   PRAPARE - Administrator, Civil Service (Medical): No    Lack of Transportation (Non-Medical): No  Physical Activity: Insufficiently Active (01/05/2024)   Exercise Vital Sign    Days of Exercise per Week: 7 days    Minutes of Exercise per Session: 10 min  Stress: Stress Concern Present (01/05/2024)   [employer] of Occupational Health - Occupational Stress Questionnaire    Feeling of Stress : Very much  Social Connections: Socially Isolated (01/05/2024)   Social Connection and Isolation Panel    Frequency of Communication with Friends and Family: Once a week    Frequency of Social Gatherings with Friends and Family: Never    Attends Religious Services: 1 to 4 times per year    Active Member of [employer] or Organizations: No    Attends Banker Meetings: Never    Marital Status: Separated    Additional Social History: ***  Allergies:   Allergies  Allergen Reactions   Tizanidine  Hcl Hives   Fluoxetine  Other (See Comments)    Caused depression and aggression   Rosuvastatin  Other (See Comments)    Whole body aches   Testosterone Other (See Comments)    ABDOMINAL PAIN and cramping   Ropinirole  Hcl Nausea Only   Requip  [Ropinirole ] Nausea Only    Metabolic Disorder Labs: Lab Results  Component Value Date   HGBA1C 5.8 11/28/2022   MPG 128 (H) 05/20/2015   MPG 123 (H) 05/10/2010   No results found for: PROLACTIN Lab Results  Component Value Date   CHOL 198 11/29/2023   TRIG 111.0 11/29/2023   HDL 48.90 11/29/2023   CHOLHDL 4 11/29/2023   VLDL 22.2 11/29/2023   LDLCALC 127 (H) 11/29/2023   LDLCALC 100 (H) 11/28/2022   Lab Results  Component Value Date   TSH 4.19 01/12/2024    Therapeutic Level Labs: No results found for: LITHIUM No results found for: CBMZ No results found for: VALPROATE  Current Medications: Current Outpatient Medications  Medication Sig Dispense Refill   acetaminophen   (TYLENOL ) 325 MG tablet Take 162.5 mg by mouth every 6 (six) hours as needed for mild pain or moderate pain.     aspirin  81 MG EC tablet Take 1 tablet (81 mg total) by mouth daily. 90 tablet 3   azelastine  (ASTELIN ) 0.1 % nasal spray Place 2 sprays into both nostrils 2 (two) times daily. Use in each nostril as  directed 30 mL 12   beclomethasone (QVAR  REDIHALER) 40 MCG/ACT inhaler Inhale 2 puffs into the lungs 2 (two) times daily. 1 each 2   Carbidopa -Levodopa  ER (RYTARY ) 61.25-245 MG CPCR 1 four times per day, the last at bedtime 360 capsule 0   citalopram  (CELEXA ) 20 MG tablet TAKE 1 TABLET (20 MG TOTAL) BY MOUTH DAILY. 30 tablet 1   empagliflozin  (JARDIANCE ) 10 MG TABS tablet Take 10 mg by mouth daily.     esomeprazole  (NEXIUM ) 40 MG capsule Take 1 capsule (40 mg total) by mouth daily. 90 capsule 0   fluticasone  (FLONASE ) 50 MCG/ACT nasal spray Place 2 sprays into both nostrils daily. 16 g 2   furosemide  (LASIX ) 20 MG tablet Take 1 tablet (20 mg total) by mouth as needed for fluid or edema (shortness of breath). 90 tablet 0   gabapentin  (NEURONTIN ) 100 MG capsule TAKE 1 CAPSULE (100 MG TOTAL) BY MOUTH AT BEDTIME. NERVE PAIN 180 capsule 1   hydrocortisone-pramoxine (ANALPRAM HC) 2.5-1 % rectal cream 1 appful rectally 3 times a day; Duration: 7 day(s)     levocetirizine (XYZAL ) 5 MG tablet Take 1 tablet (5 mg total) by mouth every evening. For allergies 90 tablet 1   metoprolol  tartrate (LOPRESSOR ) 25 MG tablet Take 1 tablet (25 mg total) by mouth daily. 90 tablet 0   montelukast  (SINGULAIR ) 10 MG tablet Take 1 tablet (10 mg total) by mouth at bedtime. 90 tablet 1   nitroGLYCERIN  (NITROSTAT ) 0.4 MG SL tablet Place 1 tablet (0.4 mg total) under the tongue every 5 (five) minutes x 3 doses as needed for chest pain. If chest pain is not relieved after 2nd dose - call 911. 25 tablet 0   pravastatin  (PRAVACHOL ) 80 MG tablet Take 1 tablet (80 mg total) by mouth every evening. 90 tablet 3    sacubitril -valsartan  (ENTRESTO ) 49-51 MG TAKE 1 TABLET BY MOUTH 2 TIMES DAILY. 180 tablet 2   trimethoprim -polymyxin b  (POLYTRIM ) ophthalmic solution Place 1 drop into both eyes every 4 (four) hours. (Patient not taking: Reported on 03/25/2024) 10 mL 0   No current facility-administered medications for this visit.    Musculoskeletal: Strength & Muscle Tone: {desc; muscle tone:32375} Gait & Station: {PE GAIT ED WJUO:77474} Patient leans: {Patient Leans:21022755}  Psychiatric Specialty Exam:  Psychiatric Specialty Exam: There were no vitals taken for this visit.There is no height or weight on file to calculate BMI. Review of Systems  General Appearance: {Appearance:22683}  Eye Contact:  {BHH EYE CONTACT:22684}  Speech:  {Speech:22685}  Volume:  {Volume (PAA):22686}  Mood:  {BHH MOOD:22306}  Affect:  {Affect (PAA):22687}  Thought Content: {Thought Content:22690}   Suicidal Thoughts:  {ST/HT (PAA):22692}  Homicidal Thoughts:  {ST/HT (PAA):22692}  Thought Process:  {Thought Process (PAA):22688}  Orientation:  {BHH ORIENTATION (PAA):22689}    Memory: {BHH MEMORY:22881}  Judgment:  {Judgement (PAA):22694}  Insight:  {Insight (PAA):22695}  Concentration:  {Concentration:21399}  Recall:  not formally assessed ***  Fund of Knowledge: {BHH GOOD/FAIR/POOR:22877}  Language: {BHH GOOD/FAIR/POOR:22877}  Psychomotor Activity:  {Psychomotor (PAA):22696}  Akathisia:  {BHH YES OR NO:22294}  AIMS (if indicated): {Desc; done/not:10129}  Assets:  {Assets (PAA):22698}  ADL's:  {BHH JIO'D:77709}  Cognition: {chl bhh cognition:304700322}  Sleep:  {BHH GOOD/FAIR/POOR:22877}    Screenings: GAD-7    Flowsheet Row Office Visit from 12/17/2018 in [facility] at [facility]  Total GAD-7 Score 12   PHQ2-9    Flowsheet Row Patient Outreach Telephone from 03/22/2024 in [place] [facility]PART[facility]  Clinical Support from 01/05/2024 in [facility] at  [facility] Office Visit from 11/28/2022 in [facility] Care at [facility] Clinical Support from 03/24/2022 in [facility] at [facility] Chronic Care Management from 03/01/2022 in [facility] Care at [facility]  PHQ-2 Total Score 2 6 2 4 6   PHQ-9 Total Score 6 15 5 15 15    Flowsheet Row ED from 02/26/2024 in [facility] Emergency Department at [facility] UC from 01/10/2022 in [facility] ED from 10/22/2021 in [facility] Department at [facility]  C-SSRS RISK CATEGORY No Risk No Risk No Risk     Collaboration of Care: {BH OP Collaboration of Care:21014065}  Patient/Guardian was advised Release of Information must be obtained prior to any record release in order to collaborate their care with an outside provider. Patient/Guardian was advised if they have not already done so to contact the registration department to sign all necessary forms in order for us  to release information regarding their care.   Consent: Patient/Guardian gives verbal consent for treatment and assignment of benefits for services provided during this visit. Patient/Guardian expressed understanding and agreed to proceed.   Jalin Erpelding, MD 9/10/20259:02 AM

## 2024-04-15 ENCOUNTER — Telehealth: Payer: Self-pay

## 2024-04-15 NOTE — Telephone Encounter (Signed)
 Copied from CRM [id number]. Topic: General - Other >> Apr 15, 2024  2:44 PM Delon DASEN wrote: Reason for CRM: Joane with [place] CAP program, form filled out was not filled out correctly for the level of care worksheet- [phone]

## 2024-04-16 ENCOUNTER — Ambulatory Visit: Admitting: Dermatology

## 2024-04-16 NOTE — Telephone Encounter (Signed)
 Tried calling corey @ back we have received and form about CAP program.

## 2024-04-18 ENCOUNTER — Other Ambulatory Visit: Payer: Self-pay

## 2024-04-18 ENCOUNTER — Emergency Department (HOSPITAL_BASED_OUTPATIENT_CLINIC_OR_DEPARTMENT_OTHER)

## 2024-04-18 ENCOUNTER — Encounter (HOSPITAL_BASED_OUTPATIENT_CLINIC_OR_DEPARTMENT_OTHER): Payer: Self-pay

## 2024-04-18 ENCOUNTER — Emergency Department (HOSPITAL_BASED_OUTPATIENT_CLINIC_OR_DEPARTMENT_OTHER)
Admission: EM | Admit: 2024-04-18 | Discharge: 2024-04-18 | Disposition: A | Discharge status: Home / Self Care | Attending: Emergency Medicine | Admitting: Emergency Medicine

## 2024-04-18 DIAGNOSIS — I10 Essential (primary) hypertension: Secondary | ICD-10-CM | POA: Diagnosis not present

## 2024-04-18 DIAGNOSIS — G20C Parkinsonism, unspecified: Secondary | ICD-10-CM | POA: Insufficient documentation

## 2024-04-18 DIAGNOSIS — Z7982 Long term (current) use of aspirin: Secondary | ICD-10-CM | POA: Insufficient documentation

## 2024-04-18 DIAGNOSIS — M79672 Pain in left foot: Secondary | ICD-10-CM | POA: Insufficient documentation

## 2024-04-18 DIAGNOSIS — M85872 Other specified disorders of bone density and structure, left ankle and foot: Secondary | ICD-10-CM | POA: Diagnosis not present

## 2024-04-18 LAB — BASIC METABOLIC PANEL WITH GFR
Anion gap: 11 (ref 5–15)
BUN: 16 mg/dL (ref 8–23)
CO2: 25 mmol/L (ref 22–32)
Calcium: 9.2 mg/dL (ref 8.9–10.3)
Chloride: 106 mmol/L (ref 98–111)
Creatinine, Ser: 0.94 mg/dL (ref 0.61–1.24)
GFR, Estimated: >60 mL/min (ref >60)
Glucose, Bld: 103 mg/dL — ABNORMAL HIGH (ref 70–99)
Potassium: 4.5 mmol/L (ref 3.5–5.1)
Sodium: 142 mmol/L (ref 135–145)

## 2024-04-18 LAB — CBC
HCT: 45.2 % (ref 39.0–52.0)
Hemoglobin: 15.3 g/dL (ref 13.0–17.0)
MCH: 32.5 pg (ref 26.0–34.0)
MCHC: 33.8 g/dL (ref 30.0–36.0)
MCV: 96 fL (ref 80.0–100.0)
Platelets: 172 K/uL (ref 150–400)
RBC: 4.71 MIL/uL (ref 4.22–5.81)
RDW: 11.9 % (ref 11.5–15.5)
WBC: 8.2 K/uL (ref 4.0–10.5)
nRBC: 0 % (ref 0.0–0.2)

## 2024-04-18 MED ORDER — PREDNISONE 20 MG PO TABS: 20.0000 mg | ORAL_TABLET | Freq: Once | ORAL | Status: DC

## 2024-04-18 MED ORDER — DEXAMETHASONE 4 MG PO TABS
10.0000 mg | ORAL_TABLET | Freq: Once | ORAL | Status: AC
Start: 2024-04-18 — End: 2024-04-18
  Administered 2024-04-18: 10 mg via ORAL
  Filled 2024-04-18: qty 3

## 2024-04-18 NOTE — ED Provider Notes (Signed)
 [facility] Provider Note   CSN: [id number] Arrival date & time: 04/18/24  1253     Patient presents with: Leg Pain   Shawn Meza is a 78 y.o. male.   Patient here with bilateral feet pain for the last few weeks but he is actually complaining mostly of pain in the left foot.  He is on gabapentin  Parkinson's meds.  He denies any injury or trauma.  Denies any fever or chills.  Denies any back pain.  No loss of bowel or bladder.  Denies any difficulty walking but just has discomfort on the top part of his left foot.  The history is provided by the patient.       Prior to Admission medications   Medication Sig Start Date End Date Taking? Authorizing Provider  acetaminophen  (TYLENOL ) 325 MG tablet Take 162.5 mg by mouth every 6 (six) hours as needed for mild pain or moderate pain.    [provider]  aspirin  81 MG EC tablet Take 1 tablet (81 mg total) by mouth daily. 08/30/21   Frann Mabel Mt, DO  azelastine  (ASTELIN ) 0.1 % nasal spray Place 2 sprays into both nostrils 2 (two) times daily. Use in each nostril as directed 11/01/23   Frann Mabel Mt, DO  beclomethasone (QVAR  REDIHALER) 40 MCG/ACT inhaler Inhale 2 puffs into the lungs 2 (two) times daily. 10/30/23   Frann Mabel Mt, DO  Carbidopa -Levodopa  ER (RYTARY ) 61.25-245 MG CPCR 1 four times per day, the last at bedtime 10/30/23   [name], Asberry GORMAN, DO  citalopram  (CELEXA ) 20 MG tablet TAKE 1 TABLET (20 MG TOTAL) BY MOUTH DAILY. 03/25/24   Frann Mabel Mt, DO  empagliflozin  (JARDIANCE ) 10 MG TABS tablet Take 10 mg by mouth daily.    [provider]  esomeprazole  (NEXIUM ) 40 MG capsule Take 1 capsule (40 mg total) by mouth daily. 02/21/24   Frann Mabel Mt, DO  fluticasone  (FLONASE ) 50 MCG/ACT nasal spray Place 2 sprays into both nostrils daily. 07/10/23   Frann Mabel Mt, DO  furosemide  (LASIX ) 20 MG tablet Take 1 tablet (20 mg  total) by mouth as needed for fluid or edema (shortness of breath). 02/21/24   Frann Mabel Mt, DO  gabapentin  (NEURONTIN ) 100 MG capsule TAKE 1 CAPSULE (100 MG TOTAL) BY MOUTH AT BEDTIME. NERVE PAIN 02/27/24   Frann Mabel Mt, DO  hydrocortisone-pramoxine [facility]) 2.5-1 % rectal cream 1 appful rectally 3 times a day; Duration: 7 day(s) 11/08/10   [provider]  levocetirizine (XYZAL ) 5 MG tablet Take 1 tablet (5 mg total) by mouth every evening. For allergies 10/30/23   Frann Mabel Mt, DO  metoprolol  tartrate (LOPRESSOR ) 25 MG tablet Take 1 tablet (25 mg total) by mouth daily. 02/21/24   Frann Mabel Mt, DO  montelukast  (SINGULAIR ) 10 MG tablet Take 1 tablet (10 mg total) by mouth at bedtime. 10/30/23   Frann Mabel Mt, DO  nitroGLYCERIN  (NITROSTAT ) 0.4 MG SL tablet Place 1 tablet (0.4 mg total) under the tongue every 5 (five) minutes x 3 doses as needed for chest pain. If chest pain is not relieved after 2nd dose - call 911. 02/21/24   Frann, Mabel Mt, DO  pravastatin  (PRAVACHOL ) 80 MG tablet Take 1 tablet (80 mg total) by mouth every evening. 09/29/23 05/16/24  Carlin Delon BROCKS, NP  sacubitril -valsartan  (ENTRESTO ) 49-51 MG TAKE 1 TABLET BY MOUTH 2 TIMES DAILY. 10/31/23   Krasowski, Robert J, MD  trimethoprim -polymyxin b  (POLYTRIM )  ophthalmic solution Place 1 drop into both eyes every 4 (four) hours. Patient not taking: Reported on 03/25/2024 02/26/24   Randol Simmonds, MD    Allergies: Tizanidine  hcl, Fluoxetine , Rosuvastatin , Testosterone, Ropinirole  hcl, and Requip  [ropinirole ]    Review of Systems  Updated Vital Signs BP 119/80   Pulse 67   Temp 98.2 F (36.8 C)   Resp 18   SpO2 97%   Physical Exam Vitals and nursing note reviewed.  Constitutional:      General: He is not in acute distress.    Appearance: He is well-developed. He is not ill-appearing.  HENT:     Head: Normocephalic and atraumatic.     Nose: Nose normal.      Mouth/Throat:     Mouth: Mucous membranes are moist.  Eyes:     Extraocular Movements: Extraocular movements intact.     Conjunctiva/sclera: Conjunctivae normal.     Pupils: Pupils are equal, round, and reactive to light.  Cardiovascular:     Rate and Rhythm: Normal rate and regular rhythm.     Pulses: Normal pulses.     Heart sounds: Normal heart sounds. No murmur heard. Pulmonary:     Effort: Pulmonary effort is normal. No respiratory distress.     Breath sounds: Normal breath sounds.  Abdominal:     General: Abdomen is flat.     Palpations: Abdomen is soft.     Tenderness: There is no abdominal tenderness.  Musculoskeletal:        General: Tenderness present. No swelling.     Cervical back: Normal range of motion and neck supple.     Comments: Tenderness to the top part of the left foot but there is no major swelling redness deformity  Skin:    General: Skin is warm and dry.     Capillary Refill: Capillary refill takes less than 2 seconds.  Neurological:     General: No focal deficit present.     Mental Status: He is alert and oriented to person, place, and time.     Cranial Nerves: No cranial nerve deficit.     Sensory: No sensory deficit.     Motor: No weakness.     Coordination: Coordination normal.     Comments: Normal strength and sensation throughout  Psychiatric:        Mood and Affect: Mood normal.     (all labs ordered are listed, but only abnormal results are displayed) Labs Reviewed  BASIC METABOLIC PANEL WITH GFR - Abnormal; Notable for the following components:      Result Value   Glucose, Bld 103 (*)    All other components within normal limits  CBC    EKG: EKG Interpretation Date/Time:  Thursday April 18 2024 13:27:03 EDT Ventricular Rate:  79 PR Interval:    QRS Duration:  115 QT Interval:  365 QTC Calculation: 419 R Axis:   92  Text Interpretation: Artifact Incomplete left bundle branch block Low voltage, precordial leads Anterior Q  waves, possibly due to ILBBB Confirmed by Ruthe Cornet [phone]) on 04/18/2024 2:46:27 PM  Radiology: [facility] Foot Complete Left Result Date: 04/18/2024 CLINICAL DATA:  Left hip pain. EXAM: LEFT FOOT - COMPLETE 3+ VIEW COMPARISON:  None Available. FINDINGS: No acute fracture or dislocation. Mild osteopenia. No significant arthritic changes. The soft tissues are unremarkable IMPRESSION: No acute fracture or dislocation. Electronically Signed   By: Vanetta Chou M.D.   On: 04/18/2024 16:42     Procedures   Medications Ordered  in the ED  dexamethasone  (DECADRON ) tablet 10 mg (has no administration in time range)                                    Medical Decision Making Amount and/or Complexity of Data Reviewed Labs: ordered. Radiology: ordered.  Risk Prescription drug management.   Shawn Meza is here mostly for left foot pain.  He is been having some bilateral leg pain throughout for couple weeks.  Looks like he is on gabapentin  for this.  He has a history of Parkinson's.  He is got normal pulses on exam.  Normal strength and sensation.  No signs of cellulitis.  There is no swelling.  Have no concern for blood clot or volume overload or peripheral arterial process.  He is really tender in the left foot.  I got an x-ray of this that was unremarkable per radiology report.  I checked basic labs which were unremarkable including his electrolytes.  I do think that this is likely some sort of inflammatory process.  But could be some neuropathic process.  Talked with him about following up with his primary care doctor to maybe titrate his gabapentin .  Will give him a dose of Decadron  to see if that can help with any inflammation or irritation in his foot.  Overall I have no concern for any other emergent process at this time.  Discharge.  This chart was dictated using voice recognition software.  Despite best efforts to proofread,  errors can occur which can change the documentation meaning.       Final diagnoses:  Left foot pain    ED Discharge Orders     None          Ruthe Cornet, DO 04/18/24 1649

## 2024-04-18 NOTE — ED Notes (Signed)
 Pt out of ED via [facility] with RN to POV with all belongings and paperwork. Not in visible distress.

## 2024-04-18 NOTE — ED Triage Notes (Signed)
 Reports worsening BIL foot, shin, knee pain for 2 weeks. Denies known injury. Came to triage in wheelchair   Hx of parkinsons

## 2024-04-18 NOTE — Discharge Instructions (Signed)
 Follow-up with your primary care doctor for further management of your process and pain.  I treated you with a long-acting steroid which I hope will help with your discomfort.

## 2024-04-22 ENCOUNTER — Other Ambulatory Visit: Payer: Self-pay | Admitting: Neurology

## 2024-04-22 ENCOUNTER — Telehealth: Payer: Self-pay | Admitting: Family Medicine

## 2024-04-22 ENCOUNTER — Other Ambulatory Visit: Payer: Self-pay | Admitting: Family Medicine

## 2024-04-22 ENCOUNTER — Other Ambulatory Visit (INDEPENDENT_AMBULATORY_CARE_PROVIDER_SITE_OTHER): Admitting: Pharmacist

## 2024-04-22 DIAGNOSIS — I5020 Unspecified systolic (congestive) heart failure: Secondary | ICD-10-CM

## 2024-04-22 DIAGNOSIS — F339 Major depressive disorder, recurrent, unspecified: Secondary | ICD-10-CM

## 2024-04-22 DIAGNOSIS — G20A1 Parkinson's disease without dyskinesia, without mention of fluctuations: Secondary | ICD-10-CM

## 2024-04-22 DIAGNOSIS — Z79899 Other long term (current) drug therapy: Secondary | ICD-10-CM

## 2024-04-22 NOTE — Telephone Encounter (Signed)
 Copied from CRM #[id number]. Topic: General - Other >> Apr 22, 2024  8:51 AM [name] wrote: Reason for CRM: Patient is calling because he needs paperwork to be signed for his CAP program today or he will lost the program, please have that paperwork fax over today and if you have any questions please call  [phone]

## 2024-04-22 NOTE — Telephone Encounter (Signed)
 Called Shawn Meza unable to reach   Called pt to let him know we are trying to get in contact , but asked him to try to get refaxed forms

## 2024-04-22 NOTE — Telephone Encounter (Signed)
 Joane called unable to reach

## 2024-04-22 NOTE — Progress Notes (Signed)
 Pharmacy Note  04/22/2024 Name: Shawn Meza MRN: [id number] DOB: 1945-09-27  Subjective: Shawn Meza is a 78 y.o. year old male who is a primary care patient of Frann Mabel Mt, DO. Clinical Pharmacist Practitioner referral was placed to assist with medication management.    Engaged with patient by telephone for follow up visit today.   Upcoming appointments:  Neurologist - Dr [name] - 04/23/2024 PCP / Frann - not currently scheduled - Last office visit was 01/12/2024  Medication Management:  Patient has been noted in the past to take medications inconsistently. He reports he is taking medications as prescribed today. He does miss doses from time to time - mostly evening doses. About once per week. He tried adherence pack in the past but did not like them. He also prefers to use is local pharmacy - [facility] and they do not currently have a packaging system  Patient is using weekly pill container to help with remembering to take medication.  He also asks for pharmacy to print what medication is for on the label and uses the chart that I prepared for him.   Depression / GAD:  Current therapy: citalopram  20mg  daily.  Patient reports fewer mood swings and anger episodes. He is taking at night because he feels the citalopram  makes him sleepy.  He is seeing counselor regularly.   CHF (HFrEF:  ECHO 02/24/2023 - EF improved but still low at 30%. Blood pressure has been at goal at last 3 office visits.   BP Readings from Last 3 Encounters:  04/18/24 119/80  03/19/24 135/78  02/26/24 106/61    Current therapy:  Jardiance  10mg  daily, furosemide  20mg  daily if needed; metoprolol  tartrate 25mg  daily and Entresto  - 49/51mg   twice a day,   Patient denies shortness of breath or increase in weight.  He is checking weight most days but has not been recording. Patient reports weight has been stable.  Reports occsional edema in lower extremities that is relieved with as needed  furosemide .  Parkinson's Disease:  Current therapy - Rytary  4 times a day (dose increased 10/18/23 after his last appointment with Dr [name]).    Objective: Review of patient status, including review of consultants reports, laboratory and other test data, was performed as part of comprehensive.  Lab Results  Component Value Date   CREATININE 0.94 04/18/2024   CREATININE 0.98 02/26/2024   CREATININE 0.90 01/12/2024    Lab Results  Component Value Date   HGBA1C 5.8 11/28/2022       Component Value Date/Time   CHOL 198 11/29/2023 1621   CHOL 163 03/31/2022 1708   TRIG 111.0 11/29/2023 1621   TRIG 107 05/09/2010 0000   HDL 48.90 11/29/2023 1621   HDL 36 (L) 03/31/2022 1708   CHOLHDL 4 11/29/2023 1621   VLDL 22.2 11/29/2023 1621   LDLCALC 127 (H) 11/29/2023 1621   LDLCALC 91 03/31/2022 1708   LDLDIRECT 112 (H) 09/28/2023 1209     Clinical ASCVD: Yes  The ASCVD Risk score (Arnett DK, et al., 2019) failed to calculate for the following reasons:   The 2019 ASCVD risk score is only valid for ages 18 to 18   Risk score cannot be calculated because patient has a medical history suggesting prior/existing ASCVD    BP Readings from Last 3 Encounters:  04/18/24 119/80  03/19/24 135/78  02/26/24 106/61     Allergies  Allergen Reactions   Tizanidine  Hcl Hives   Fluoxetine  Other (See Comments)  Caused depression and aggression   Rosuvastatin  Other (See Comments)    Whole body aches   Testosterone Other (See Comments)    ABDOMINAL PAIN and cramping   Ropinirole  Hcl Nausea Only   Requip  [Ropinirole ] Nausea Only    Medications Reviewed Today     Reviewed by Carla Milling, RPH-CPP (Pharmacist) on 04/22/24 at 1546  Med List Status: <None>   Medication Order Taking? Sig Documenting Provider Last Dose Status Informant  acetaminophen  (TYLENOL ) 325 MG tablet [id number] Yes Take 162.5 mg by mouth every 6 (six) hours as needed for mild pain or moderate pain. [provider]  Active   aspirin  81 MG EC tablet [id number] Yes Take 1 tablet (81 mg total) by mouth daily. Frann Mabel Mt, DO  Active   azelastine  (ASTELIN ) 0.1 % nasal spray [id number] Yes Place 2 sprays into both nostrils 2 (two) times daily. Use in each nostril as directed Frann Mabel Mt, DO  Active   beclomethasone (QVAR  REDIHALER) 40 MCG/ACT inhaler [id number] Yes Inhale 2 puffs into the lungs 2 (two) times daily. Frann Mabel Mt, DO  Active   Carbidopa -Levodopa  ER (RYTARY ) 61.25-245 MG CPCR [id number] Yes 1 four times per day, the last at bedtime [name], Asberry RAMAN, DO  Active   citalopram  (CELEXA ) 20 MG tablet [id number] Yes TAKE 1 TABLET (20 MG TOTAL) BY MOUTH DAILY. Frann Mabel Mt, DO  Active   empagliflozin  (JARDIANCE ) 10 MG TABS tablet [id number] Yes Take 10 mg by mouth daily. [provider]  Active   esomeprazole  (NEXIUM ) 40 MG capsule [id number] Yes Take 1 capsule (40 mg total) by mouth daily. Frann Mabel Mt, DO  Active   fluticasone  (FLONASE ) 50 MCG/ACT nasal spray [id number]  Place 2 sprays into both nostrils daily. Frann Mabel Mt, DO  Active   furosemide  (LASIX ) 20 MG tablet [id number] Yes Take 1 tablet (20 mg total) by mouth as needed for fluid or edema (shortness of breath). Frann Mabel Mt, DO  Active   gabapentin  (NEURONTIN ) 100 MG capsule [id number] Yes TAKE 1 CAPSULE (100 MG TOTAL) BY MOUTH AT BEDTIME. NERVE PAIN Frann Mabel Mt, DO  Active   hydrocortisone-pramoxine [facility]) 2.5-1 % rectal cream [id number]  1 appful rectally 3 times a day; Duration: 7 day(s) [provider]  Active   levocetirizine (XYZAL ) 5 MG tablet [id number] Yes Take 1 tablet (5 mg total) by mouth every evening. For allergies Frann Mabel Mt, DO  Active   metoprolol  tartrate (LOPRESSOR ) 25 MG tablet [id number] Yes Take 1 tablet (25 mg total) by mouth daily. Frann Mabel Mt, DO  Active   montelukast  (SINGULAIR ) 10 MG tablet  [id number] Yes Take 1 tablet (10 mg total) by mouth at bedtime. Frann Mabel Mt, DO  Active   nitroGLYCERIN  (NITROSTAT ) 0.4 MG SL tablet [id number] Yes Place 1 tablet (0.4 mg total) under the tongue every 5 (five) minutes x 3 doses as needed for chest pain. If chest pain is not relieved after 2nd dose - call 911. Frann Mabel Mt, DO  Active   pravastatin  (PRAVACHOL ) 80 MG tablet [id number] Yes Take 1 tablet (80 mg total) by mouth every evening. Carlin Delon BROCKS, NP  Active   sacubitril -valsartan  (ENTRESTO ) 49-51 MG [id number] Yes TAKE 1 TABLET BY MOUTH 2 TIMES DAILY. Krasowski, Robert J, MD  Active    Patient not taking:   Discontinued 04/22/24 1542 (Completed Course)             Patient Active Problem List   Diagnosis Date  Noted   LBBB (left bundle branch block)    Major depressive disorder, recurrent episode, moderate (HCC) 06/30/2022   Lumbar spondylosis 06/29/2022   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   Diarrhea 11/04/2020   Rectal bleeding 11/04/2020   Trochanteric bursitis of right hip    Transient ischemic attack    NSTEMI (non-ST elevated myocardial infarction) (HCC)    Nephrolithiasis    Metabolic syndrome    Major depression, chronic    Long-term use of aspirin  therapy    Hypercholesterolemia    Hiatal hernia    GERD (gastroesophageal reflux disease)    Essential tremor    Essential hypertension    Erectile dysfunction    Elevated PSA    Colon cancer (HCC)    Chronic coronary artery disease    Arthritis    Myofascial pain 12/20/2019   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   Low back pain 10/28/2019   Radiculopathy, lumbar region 10/28/2019   Fatigue 01/15/2019   Chronic systolic (congestive) heart failure (HCC) 09/18/2018   Ischemic cardiomyopathy 09/16/2018   Aortic regurgitation 09/16/2018   Cardiomyopathy, unspecified (HCC) 06/13/2018   Abscess of right axilla 12/12/2017   BMI 33.0-33.9,adult 12/12/2017   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   Acute blood loss anemia 10/25/2017   Acute postoperative respiratory insufficiency 10/25/2017   Postoperative delirium 10/25/2017   Dyslipidemia 10/17/2017   Chest pain in adult 10/11/2017   SOB (shortness of breath) 03/31/2017   Long term current use of aspirin  03/29/2017   Parkinson's disease (HCC) 01/02/2017   Memory change 07/06/2015   Depression, recurrent 07/06/2015   Medial meniscus tear 10/11/2011   Neuroma of foot 10/11/2011   Coronary artery disease involving native coronary artery of native heart with angina pectoris (HCC) 12/28/2010   OTHER TESTICULAR HYPOFUNCTION 05/20/2010   Mixed hyperlipidemia 05/20/2010   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/20/2010   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 05/20/2010   GERD 05/20/2010   History of cardiovascular disorder 05/20/2010   NEPHROLITHIASIS, HX OF 05/20/2010   BENIGN PROSTATIC HYPERTROPHY, HX OF, S/P TURP 05/20/2010     Medication Assistance:  None required.  Patient affirms current coverage meets needs. (Has [facility] and Medicaid coverage)   Medication List for Fahim Kats (DOB: Dec 06, 1945) Updated 02/21/2024 Medication Reason for taking 30 minutes before breakfast Morning / with breakfast Lunch Dinner Bedtime  Esomeprazole /Nexium  40mg  capsule Acid reflux 1 capsule      Rytary  61.5/245 mg Parkinson's  1 capsule 1 capsule 1 capsule 1 capsule  Entresto   Heart  1 tablet  1 tablet   Jardiance  10mg  Heart/ kidney protection  1 tablet     Metoprolol  25mg   Heart / blood pressure  1 tablet     Pravastatin  80mg   Heart / cholesterol        Citalopram  20mg  Mood     1 tablet  Gabapentin  100mg  Nerve / leg pain at night     1 capsule  Montelukast  10mg  Allergies / asthma     1 tablet  Levocetirizine 5mg  Allergies     1 tablet  Pravastatin  80mg   Heart / cholesterol     1 tablet           Fluticasone  nasal spray (Flonase )  Allergic rhinitis / runny nose  2 sprays in each  nostril     Azelastine  0.1% nasal spray (Astelin ) Runny nose  2 sprays in each nostril   2 sprays in each nostril  Qvar   inhaler Breathing / asthma  2 inhalations into lungs   2 inhalations into lungs            Medications to take if needed   Tylenol  325mg       Take 0.5 to 1 tablet at bedtime as needed for sleep  Nitroglycerin  tablets  If having chest pain, place 1 tablet under your tongue and allow to dissolve, if chest pain continues can report for 2 more doses but call 911 immediate if chest pain does not improve with 2nd dose.    Furosemide  20mg  Heart / swelling Check weight every day and record in a notebook. Take furosemide  if you notice an increase of 3 or more pounds in 24 hours or 5 pounds in a week OR if you have shortness of breath, swelling in your legs or ankles      Assessment / Plan: CHF / hypertension:  Last 3 office BPs have been well controlled.  Continue current medication therapy - Jardiance  10mg , Entresto , metoprolol  25mg  once a day and furosemide  20mg  daily as needed.  Parkinson's: Continue Rytary  ER per Dr Collie directions. Coordinated refill and delivery of Rytary  for patient from his pharmacy.  Reminded patient of appt for tomorrow 9/23 with Dr [name]  Depression:  Continue citalopram  to 20mg  daily. Coordinated refill and delivery of citalopram  for patient from his pharmacy.  Continue to see counselor and psychiatrist.   Medication management  Reviewed and updated medication list Reviewed refill history and adherence.  Encouraged patient to continue to use weekly pill container to help with medication adherance.  He feels his current medication container is helping. Requested needed meds from [facility] at patient request - Azelastine  nasal spray, Rytary , citalopram  and QVAR  inhaler.   Recommended patient call his optometrist or I also offered appointment to see Dr Frann to check his eyes. He can use something for dry eyes until he gets into see someone  - lubricating eye drops, Systane or Lumify.  Recommended he call dermatology office to ask about when he can get area on his scalp wet post Mohs procedure.   Follow Up:  4 to 6 weeks   Madelin Ray, PharmD Clinical Pharmacist [facility]  - [facility] [phone]

## 2024-04-22 NOTE — Telephone Encounter (Signed)
 Joane called to check status of refaxing paperwork , unable to leave voicemail             Copied from CRM [id number]. Topic: General - Other >> Apr 22, 2024 11:05 AM Mercedes MATSU wrote: Reason for CRM: patient called in requesting assistance on the cap program, he said he took his paper work to the clinic 4 weeks ago. Patient states that he has also 2-3 times and the people from caps have called too. Patient states that Dr. Frann needs to fax over the forms so he can start his services. Patient is requesting a call back once this is sent [phone]. Patient states that if its not faxed over today he will lose his services and they will cancel on him.

## 2024-04-22 NOTE — Telephone Encounter (Signed)
 Spoke with Shawn Meza , stated one portion of the form was filled out wrong and the pt was denied , asked for new form and old forms .Shawn Meza She stated the forms came from the state and she would try to get them to refax the forms.

## 2024-04-22 NOTE — Progress Notes (Deleted)
 Assessment/Plan:   1.  Parkinsons Disease  -Clinically, he is always worse when he is off of levodopa .  Compliance has always been a challenge here.  He was last seen here about 6 months ago and the medication was refilled for 30-day intervals on 3 occasions.  - He is following with dermatology, Dr. Paci  2.  Depression  -on cilatopram  -Continues to follow with Dr. Gutterman.  He was last seen on September 11.  He is very happy with that care and we appreciate it as well  3.  B12 deficiency  -On supplementation.  4.  Lumbar spinal stenosis  -has followed with Dr. Unice and [place]  -Has seen Dr. Chick   5.  Insomnia  -f/u with primary care    Subjective:   Shawn Meza was seen today in follow up for Parkinsons disease.  My previous records were reviewed prior to todays visit as well as outside records available to me.  Patient is supposed to be on Rytary  245 mg 34 times per day, increased last visit.  Dispense history was reviewed.  30-day supplies of the medication were dispensed from his pharmacy April 1, July 24, August 26.  Current prescribed movement disorder medications: Rytary , 245 mg, 1 po qid (increased) B12 supplement Melatonin, 3 mg  Prior medications: Carbidopa /levodopa  25/100 IR: Carbidopa /levodopa  25/100 CR: Carbidopa /levodopa  50/200 CR; rytary  (c/o dry mouth, trouble breathing at night and constipation); Trintellix  (he thought it caused dreams, even though we told him that it was the Parkinsons that was causing the dreaming)   ALLERGIES:   Allergies  Allergen Reactions   Tizanidine  Hcl Hives   Fluoxetine  Other (See Comments)    Caused depression and aggression   Rosuvastatin  Other (See Comments)    Whole body aches   Testosterone Other (See Comments)    ABDOMINAL PAIN and cramping   Ropinirole  Hcl Nausea Only   Requip  [Ropinirole ] Nausea Only    CURRENT MEDICATIONS:  Outpatient Encounter Medications as of 04/23/2024  Medication Sig    acetaminophen  (TYLENOL ) 325 MG tablet Take 162.5 mg by mouth every 6 (six) hours as needed for mild pain or moderate pain.   aspirin  81 MG EC tablet Take 1 tablet (81 mg total) by mouth daily.   azelastine  (ASTELIN ) 0.1 % nasal spray Place 2 sprays into both nostrils 2 (two) times daily. Use in each nostril as directed   beclomethasone (QVAR  REDIHALER) 40 MCG/ACT inhaler Inhale 2 puffs into the lungs 2 (two) times daily.   Carbidopa -Levodopa  ER (RYTARY ) 61.25-245 MG CPCR 1 four times per day, the last at bedtime   citalopram  (CELEXA ) 20 MG tablet TAKE 1 TABLET (20 MG TOTAL) BY MOUTH DAILY.   empagliflozin  (JARDIANCE ) 10 MG TABS tablet Take 10 mg by mouth daily.   esomeprazole  (NEXIUM ) 40 MG capsule Take 1 capsule (40 mg total) by mouth daily.   fluticasone  (FLONASE ) 50 MCG/ACT nasal spray Place 2 sprays into both nostrils daily.   furosemide  (LASIX ) 20 MG tablet Take 1 tablet (20 mg total) by mouth as needed for fluid or edema (shortness of breath).   gabapentin  (NEURONTIN ) 100 MG capsule TAKE 1 CAPSULE (100 MG TOTAL) BY MOUTH AT BEDTIME. NERVE PAIN   hydrocortisone-pramoxine (ANALPRAM HC) 2.5-1 % rectal cream 1 appful rectally 3 times a day; Duration: 7 day(s)   levocetirizine (XYZAL ) 5 MG tablet Take 1 tablet (5 mg total) by mouth every evening. For allergies   metoprolol  tartrate (LOPRESSOR ) 25 MG tablet Take 1 tablet (  25 mg total) by mouth daily.   montelukast  (SINGULAIR ) 10 MG tablet Take 1 tablet (10 mg total) by mouth at bedtime.   nitroGLYCERIN  (NITROSTAT ) 0.4 MG SL tablet Place 1 tablet (0.4 mg total) under the tongue every 5 (five) minutes x 3 doses as needed for chest pain. If chest pain is not relieved after 2nd dose - call 911.   pravastatin  (PRAVACHOL ) 80 MG tablet Take 1 tablet (80 mg total) by mouth every evening.   sacubitril -valsartan  (ENTRESTO ) 49-51 MG TAKE 1 TABLET BY MOUTH 2 TIMES DAILY.   trimethoprim -polymyxin b  (POLYTRIM ) ophthalmic solution Place 1 drop into both eyes  every 4 (four) hours. (Patient not taking: Reported on 03/25/2024)   No facility-administered encounter medications on file as of 04/23/2024.    Objective:   PHYSICAL EXAMINATION:    VITALS:   There were no vitals filed for this visit.    GEN:  The patient appears stated age and is in NAD. HEENT:  Normocephalic, atraumatic.  The mucous membranes are moist.  Cardiovascular: Regular rate rhythm Lungs: Clear to auscultation bilaterally  Neurological examination:  Orientation: The patient is alert and oriented x3. Cranial nerves: There is good facial symmetry with facial hypomimia. The speech is fluent and clear. Soft palate rises symmetrically and there is no tongue deviation. Hearing is intact to conversational tone. Sensation: Sensation is intact to light touch throughout Motor: Strength is at least antigravity x4.  Movement examination: Tone: There is mild increased tone in the LUE Abnormal movements: there is LUE>RUE rest tremor and left lower extremity rest tremor Coordination:  There is mild decremation, with any form of RAMS, including alternating supination and pronation of the forearm, hand opening and closing, finger taps, heel taps and toe taps on the L Gait and Station: The patient is a bit slow to arise.  Patient has decreased arm swing on the L.  This is stable from prior visits.  I have reviewed and interpreted the following labs independently    Chemistry      Component Value Date/Time   NA 142 04/18/2024 1321   NA 145 (H) 09/28/2023 1209   K 4.5 04/18/2024 1321   CL 106 04/18/2024 1321   CO2 25 04/18/2024 1321   BUN 16 04/18/2024 1321   BUN 16 09/28/2023 1209   CREATININE 0.94 04/18/2024 1321   CREATININE 0.90 01/12/2024 1551      Component Value Date/Time   CALCIUM  9.2 04/18/2024 1321   ALKPHOS 65 11/29/2023 1621   AST 19 01/12/2024 1551   ALT 12 01/12/2024 1551   BILITOT 0.5 01/12/2024 1551   BILITOT 0.4 09/28/2023 1209       Lab Results   Component Value Date   WBC 8.2 04/18/2024   HGB 15.3 04/18/2024   HCT 45.2 04/18/2024   MCV 96.0 04/18/2024   PLT 172 04/18/2024    Lab Results  Component Value Date   TSH 4.19 01/12/2024   Total time spent on today's visit was *** minutes, including both face-to-face time and nonface-to-face time.  Time included that spent on review of records (prior notes available to me/labs/imaging if pertinent), discussing treatment and goals, answering patient's questions and coordinating care.   Cc:  Frann Mabel Mt, DO

## 2024-04-23 ENCOUNTER — Encounter: Payer: Self-pay | Admitting: Neurology

## 2024-04-23 ENCOUNTER — Ambulatory Visit: Admitting: Neurology

## 2024-04-24 ENCOUNTER — Ambulatory Visit (HOSPITAL_COMMUNITY)

## 2024-04-24 ENCOUNTER — Ambulatory Visit: Admitting: Psychology

## 2024-04-24 ENCOUNTER — Telehealth: Payer: Self-pay | Admitting: Family Medicine

## 2024-04-24 NOTE — Telephone Encounter (Signed)
 Shawn Meza with social services called regarding a homehealth order. We do not see the paperwork for it. He requested to speak directly to a nurse to explain what the issue was but it was during lunch. He would like a callback at [phone]

## 2024-04-24 NOTE — Telephone Encounter (Addendum)
 CAP Program paper refaxed, with problem list and medications

## 2024-04-29 ENCOUNTER — Other Ambulatory Visit: Payer: Self-pay

## 2024-04-29 ENCOUNTER — Encounter: Payer: Self-pay | Admitting: *Deleted

## 2024-04-29 NOTE — Telephone Encounter (Signed)
 Patient no showed last appointment approval to send meds

## 2024-04-29 NOTE — Patient Outreach (Signed)
 Complex Care Management   Visit Note  05/09/2024 updated note for 04/29/24  Name:  Shawn Meza MRN: [id number] DOB: [birth year]  Situation: Referral received for Complex Care Management related to SDOH Barriers:  Food insecurity Social Isolation polypharmacy I obtained verbal consent from Patient.  Visit completed with Patient  on the phone  Mr Shawn Meza called RN CM  [employer] ([employer]) forms Mr Shawn Meza followed up with his pcp about the CAP forms, had not been completed on 04/04/24 after he dropped them off to pcp after 03/22/24  He reports his medical provider made an error on his personal care services letter and it was denied  He reports he has received a new letter today in the mail to take with him to a scheduled pcp appointment on 04/30/24  He has spoken with Christopher, [employer]S/D[employer] staff [phone]) today about this delay in services  Vision- He continues to have visual issues Reporting he can not see and his eyes feel gummy & irritated  Med center [facility] ED visit 04/18/24 for left foot pain after no injury & no swelling as gabapentin  ordered for his parkinson's is not relieving. ED imaging showed no fracture or dislocation He was administered dexamethasone  for inflammatory process + encouraged to use the RICE (rest, ice, compression, elevation) method. He was encouraged to follow up with his pcp, Shawn Meza. 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  Medication pill packing He is being followed and assisted by Madelin Carla [facility]  Background:   Past Medical History:  Diagnosis Date   Abscess of right axilla 12/12/2017   Acute blood loss anemia 10/25/2017   Acute postoperative respiratory  insufficiency 10/25/2017   Aortic regurgitation 09/16/2018   Arthritis    BMI 33.0-33.9,adult 12/12/2017   Cardiomyopathy, unspecified (HCC) 06/13/2018   Chest pain in adult 10/11/2017   Chronic coronary artery disease    Chronic systolic (congestive) heart failure (HCC) 09/18/2018   Colon cancer (HCC)    Coronary artery disease involving native coronary artery of native heart with angina pectoris 12/28/2010   Added automatically from request for surgery [id number]     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        GERD (gastroesophageal reflux disease)    Hiatal hernia    History of cardiovascular disorder 05/20/2010   Qualifier: Diagnosis of   By: Joshua MD, Ned CROME.     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        Ischemic cardiomyopathy 09/16/2018   LBBB (left bundle branch block)    Long term current use of aspirin  03/29/2017   Long-term use of aspirin  therapy    Low back pain 10/28/2019  Lumbar spondylosis 06/29/2022   Major depression, chronic    Major depressive disorder, recurrent episode, moderate (HCC) 06/30/2022   Medial meniscus tear 10/11/2011   Memory change 07/06/2015   Metabolic syndrome    Mixed hyperlipidemia 05/20/2010   Qualifier: Diagnosis of   By: Joshua MD, Debby CROME.        Myofascial pain 12/20/2019   Nephrolithiasis    hx of   NEPHROLITHIASIS, HX OF 05/20/2010   Qualifier: Diagnosis of   By: Joshua MD, Debby CROME.        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   SOB (shortness of breath) 03/31/2017   Transient ischemic attack    hx of   Trochanteric bursitis of right hip     Assessment: Patient Reported Symptoms:  Cognitive Cognitive Status: No symptoms reported, Alert and oriented to person, place, and time, Normal speech and language skills, Insightful and able to interpret abstract concepts Cognitive/Intellectual Conditions Management [RPT]: None reported or documented in medical history or problem list   Health Maintenance Behaviors: Annual physical exam Healing Pattern: Average Health Facilitated by: Pain control, Prayer/meditation, Rest  Neurological Neurological Review of Symptoms: Vision changes, Weakness Neurological Management Strategies: Adequate rest, Routine screening, Medication therapy Neurological Self-Management Outcome: 3 (uncertain)  HEENT HEENT Symptoms Reported: Eye dryness, Other: (Reporting he can not see and his eyes feel gummy & irritated) HEENT Management Strategies: Activity, Routine screening HEENT Self-Management Outcome: 3 (uncertain)    Cardiovascular Cardiovascular Symptoms Reported: No symptoms reported Does patient have uncontrolled Hypertension?: No Cardiovascular Management Strategies: Adequate rest, Medication therapy, Routine screening, Medical device, Diet modification Cardiovascular Self-Management Outcome: 4 (good)  Respiratory Respiratory Symptoms Reported: Shortness of breath    Endocrine Endocrine Symptoms Reported: Not assessed    Gastrointestinal Gastrointestinal Symptoms Reported: Not assessed      Genitourinary Genitourinary Symptoms Reported: Not assessed    Integumentary Integumentary Symptoms Reported: Not assessed    Musculoskeletal Musculoskelatal Symptoms  Reviewed: Difficulty walking, Joint pain Musculoskeletal Management Strategies: Medical device, Medication therapy, Routine screening Musculoskeletal Self-Management Outcome: 3 (uncertain)      Psychosocial       Quality of Family Relationships: non-existent Do you feel physically threatened by others?: No    05/09/2024    PHQ2-9 Depression Screening   Little interest or pleasure in doing things    Feeling down, depressed, or hopeless    PHQ-2 - Total Score    Trouble falling or staying asleep, or sleeping too much    Feeling tired or having little energy    Poor appetite or overeating     Feeling bad about yourself - or that you are a failure or have let yourself or your family down    Trouble concentrating on things, such as reading the newspaper or watching television    Moving or speaking so slowly that other people could have noticed.  Or the opposite - being so fidgety or restless that you have been moving around a lot more than usual    Thoughts that you would be better off dead, or hurting yourself in some way    PHQ2-9 Total Score    If you checked off any problems, how difficult have these problems made it for you to do  your work, take care of things at home, or get along with other people    Depression Interventions/Treatment      There were no vitals filed for this visit.  Medications Reviewed Today   Medications were not reviewed in this encounter     Recommendation:   PCP Follow-up Home Health requests: Skilled Nursing Continue Current Plan of Care  Follow Up Plan:   Telephone follow up appointment date/time:  04/30/24  Suzen L. Ramonita, RN, BSN, CCM [place]  [facility], [facility] RN Care Manager Direct Dial: [phone]  Fax: [phone]

## 2024-04-30 ENCOUNTER — Ambulatory Visit: Admitting: Family Medicine

## 2024-04-30 ENCOUNTER — Other Ambulatory Visit: Payer: Self-pay | Admitting: *Deleted

## 2024-04-30 ENCOUNTER — Encounter: Payer: Self-pay | Admitting: Family Medicine

## 2024-04-30 VITALS — BP 126/82 | HR 100 | Temp 97.7°F | Resp 16 | Ht 66.0 in | Wt 211.6 lb

## 2024-04-30 DIAGNOSIS — M79672 Pain in left foot: Secondary | ICD-10-CM

## 2024-04-30 DIAGNOSIS — G44209 Tension-type headache, unspecified, not intractable: Secondary | ICD-10-CM

## 2024-04-30 NOTE — Progress Notes (Signed)
 Musculoskeletal Exam  Patient: Shawn Meza DOB: Oct 05, 1945  DOS: 04/30/2024  SUBJECTIVE:  Chief Complaint:   Chief Complaint  Patient presents with   Foot Pain    Foot Pain    Shawn Meza is a 78 y.o.  male for evaluation and treatment of L foot pain.   Onset:  4 weeks ago. No inj or change in activity.  Location: top of foot medially and stretching up antero-medial leg Character:  sore  Progression of issue:  has worsened slightly Associated symptoms: no bruising, redness, swelling, catching/locking Treatment: to date has been oral steroids and home exercises.   Neurovascular symptoms: no  A few weeks ago, has had a pressure over both temples. No inj or change in activity. Has not slept in his bed over past 2 mo after getting sealant over face. The eye and skin issues have resolved from this.  He has associated bilateral neck pain and jaw pain.  He has no difficulty chewing/eating.  Denies fevers.  He wants to make sure it is not related to spraying water post sealant in his face or from her recent skin biopsy he had done with his dermatologist.  Past Medical History:  Diagnosis Date   Abscess of right axilla 12/12/2017   Acute blood loss anemia 10/25/2017   Acute postoperative respiratory insufficiency 10/25/2017   Aortic regurgitation 09/16/2018   Arthritis    BMI 33.0-33.9,adult 12/12/2017   Cardiomyopathy, unspecified (HCC) 06/13/2018   Chest pain in adult 10/11/2017   Chronic coronary artery disease    Chronic systolic (congestive) heart failure (HCC) 09/18/2018   Colon cancer (HCC)    Coronary artery disease involving native coronary artery of native heart with angina pectoris 12/28/2010   Added automatically from request for surgery [id number]     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        GERD (gastroesophageal reflux disease)    Hiatal hernia    History of cardiovascular disorder 05/20/2010   Qualifier: Diagnosis of   By: Joshua MD, Ned CROME.     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        Ischemic cardiomyopathy 09/16/2018   LBBB (left bundle branch block)    Long term current use of aspirin  03/29/2017   Long-term use of aspirin  therapy    Low back pain 10/28/2019   Lumbar spondylosis 06/29/2022   Major depression, chronic    Major depressive disorder, recurrent episode, moderate (HCC) 06/30/2022   Medial meniscus tear 10/11/2011   Memory change 07/06/2015   Metabolic syndrome    Mixed hyperlipidemia 05/20/2010   Qualifier: Diagnosis of   By: Joshua MD, Ned CROME.        Myofascial pain 12/20/2019   Nephrolithiasis    hx of   NEPHROLITHIASIS, HX OF 05/20/2010   Qualifier: Diagnosis of   By: Joshua MD, Ned CROME.        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 No acute distress. Thorax & Lungs: No accessory muscle use Musculoskeletal: L foot.    Tenderness to palpation: yes over extensor tendons at distal leg and navicular bone Deformity: no Ecchymosis: no Tests positive: none  Tests negative: anterior drawer, squeeze Mild TTP over the temporal region bilaterally; no edema, erythema, ecchymosis, deformity.  Mild TTP over bilateral TMJ without obvious clicking/popping/deviation.  Mild TTP over the cervical paraspinal musculature bilaterally. Neurologic: Normal sensory function.  Psychiatric: Normal mood. Age appropriate judgment and insight. Alert & oriented x 3.    Assessment:  Left foot pain  Tension headache  Plan: Stretches/exercises, heat, ice, Tylenol . Arch supports recommended.  Send message in 1 month if no improvement, will refer to Ortho foot/ankle. Unlikely to be related to excisional biopsy or sealant exposure.  Low risk for temporal arteritis.  Heat, ice, Tylenol , stretch the neck musculature.  Could consider physical therapy. F/u as originally scheduled. The patient voiced understanding and agreement to the plan.  I spent 32 min w the pt discussing the above plans in addition to reviewing his chart on the same day of the visit.    Mabel [place], DO 04/30/24  11:17 AM

## 2024-04-30 NOTE — Telephone Encounter (Signed)
 Called Shawn Meza with Social Service back Lvm letting him know I was returning call about paperwork To discuss what we need to correct it paperwork.

## 2024-04-30 NOTE — Patient Instructions (Signed)
 Visit Information  Thank you for taking time to visit with me today. Please don't hesitate to contact me if I can be of assistance to you before our next scheduled appointment.  Your next care management appointment is by telephone on 05/21/24 215 pm with Rosaline Finlay RN CM   Telephone follow up appointment date/time:  05/21/24 Shawn Meza it has been a pleasure working with you! In October 2025 you will start receiving calls from my peer Rosaline RAMAN  Please call the care guide team at [phone] if you need to cancel, schedule, or reschedule an appointment.   Please call the Suicide and Crisis Lifeline: 988 call the [place]  National Suicide Prevention Lifeline: 7692594781 or TTY: (440) 210-4607 TTY 367-797-8403) to talk to a trained counselor call 1-800-273-TALK (toll free, 24 hour hotline) go to [facility] [address], [place] [phone]) call 911 if you are experiencing a Mental Health or Behavioral Health Crisis or need someone to talk to.  Revere Maahs L. Ramonita, RN, BSN, CCM [place]  [facility], [facility] RN Care Manager Direct Dial: [phone]  Fax: [phone]

## 2024-04-30 NOTE — Patient Instructions (Signed)
 Consider Powerstep insoles. There are very quality over the counter inserts. Shop around online and in stores. Dr. Heriberto is a cheaper alternative, though is not as high of quality.   Ice/cold pack over area for 10-15 min twice daily.  Heat (pad or rice pillow in microwave) over affected area, 10-15 minutes twice daily.   OK to take Tylenol  1000 mg (2 extra strength tabs) or 975 mg (3 regular strength tabs) every 6 hours as needed.  Let us  know if you need anything.  Ankle Exercises It is normal to feel mild stretching, pulling, tightness, or discomfort as you do these exercises, but you should stop right away if you feel sudden pain or your pain gets worse.  Stretching and range of motion exercises These exercises warm up your muscles and joints and improve the movement and flexibility of your ankle. These exercises also help to relieve pain, numbness, and tingling. Exercise A: Dorsiflexion/Plantar Flexion    Sit with your affected knee straight or bent. Do not rest your foot on anything. Flex your affected ankle to tilt the top of your foot toward your shin. Hold this position for 5 seconds. Point your toes downward to tilt the top of your foot away from your shin. Hold this position for 5 seconds. Repeat 2 times. Complete this exercise 3 times per week. Exercise B: Ankle Alphabet    Sit with your affected foot supported at your lower leg. Do not rest your foot on anything. Make sure your foot has room to move freely. Think of your affected foot as a paintbrush, and move your foot to trace each letter of the alphabet in the air. Keep your hip and knee still while you trace. Make the letters as large as you can without increasing any discomfort. Trace every letter from A to Z. Repeat 2 times. Complete this exercise 3 times per week. Strengthening exercises These exercises build strength and endurance in your ankle. Endurance is the ability to use your muscles for a long time, even  after they get tired. Exercise D: Dorsiflexors    Secure a rubber exercise band or tube to an object, such as a table leg, that will stay still when the band is pulled. Secure the other end around your affected foot. Sit on the floor, facing the object with your affected leg extended. The band or tube should be slightly tense when your foot is relaxed. Slowly flex your affected ankle and toes to bring your foot toward you. Hold this position for 3 seconds.  Slowly return your foot to the starting position, controlling the band as you do that. Do a total of 10 repetitions. Repeat 2 times. Complete this exercise 3 times per week. Exercise E: Plantar Flexors    Sit on the floor with your affected leg extended. Loop a rubber exercise band or tube around the ball of your affected foot. The ball of your foot is on the walking surface, right under your toes. The band or tube should be slightly tense when your foot is relaxed. Slowly point your toes downward, pushing them away from you. Hold this position for 3 seconds. Slowly release the tension in the band or tube, controlling smoothly until your foot is back in the starting position. Repeat for a total of 10 repetitions. Repeat 2 times. Complete this exercise 3 times per week. Exercise F: Towel Curls    Sit in a chair on a non-carpeted surface, and put your feet on the floor. Place a  towel in front of your feet.  Keeping your heel on the floor, put your affected foot on the towel. Pull the towel toward you by grabbing the towel with your toes and curling them under. Keep your heel on the floor. Let your toes relax. Grab the towel again. Keep going until the towel is completely underneath your foot. Repeat for a total of 10 repetitions. Repeat 2 times. Complete this exercise 3 times per week. Exercise G: Heel Raise ( Plantar Flexors, Standing)     Stand with your feet shoulder-width apart. Keep your weight spread evenly over the width of  your feet while you rise up on your toes. Use a wall or table to steady yourself, but try not to use it for support. If this exercise is too easy, try these options: Shift your weight toward your affected leg until you feel challenged. If told by your health care provider, lift your uninjured leg off the floor. Hold this position for 3 seconds. Repeat for a total of 10 repetitions. Repeat 2 times. Complete this exercise 3 times per week. Exercise H: Tandem Walking Stand with one foot directly in front of the other. Slowly raise your back foot up, lifting your heel before your toes, and place it directly in front of your other foot. Continue to walk in this heel-to-toe way for 10 steps or for as long as told by your health care provider. Have a countertop or wall nearby to use if needed to keep your balance, but try not to hold onto anything for support. Repeat 2 times. Complete this exercises 3 times per week. Make sure you discuss any questions you have with your health care provider. Document Released: 06/01/2005 Document Revised: 03/17/2016 Document Reviewed: 04/05/2015 Elsevier Interactive Patient Education  2018 [employer].

## 2024-04-30 NOTE — Patient Outreach (Signed)
 Complex Care Management   Visit Note  05/09/2024 updated note for 04/23/24  Name:  Shawn Meza MRN: [id number] DOB: 12-24-1945  Situation: Referral received for Complex Care Management related to Heart Failure I obtained verbal consent from Patient.  Visit completed with Patient  on the phone  Spoke with DSS SW again about services for patient Patient update   Background:   Past Medical History:  Diagnosis Date   Abscess of right axilla 12/12/2017   Acute blood loss anemia 10/25/2017   Acute postoperative respiratory insufficiency 10/25/2017   Aortic regurgitation 09/16/2018   Arthritis    BMI 33.0-33.9,adult 12/12/2017   Cardiomyopathy, unspecified (HCC) 06/13/2018   Chest pain in adult 10/11/2017   Chronic coronary artery disease    Chronic systolic (congestive) heart failure (HCC) 09/18/2018   Colon cancer (HCC)    Coronary artery disease involving native coronary artery of native heart with angina pectoris 12/28/2010   Added automatically from request for surgery [id number]     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        GERD (gastroesophageal reflux disease)    Hiatal hernia    History of cardiovascular disorder 05/20/2010   Qualifier: Diagnosis of   By: Joshua MD, Ned CROME.     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        Ischemic cardiomyopathy 09/16/2018   LBBB (left bundle branch block)    Long term current use of aspirin  03/29/2017   Long-term use of aspirin  therapy    Low back pain 10/28/2019   Lumbar  spondylosis 06/29/2022   Major depression, chronic    Major depressive disorder, recurrent episode, moderate (HCC) 06/30/2022   Medial meniscus tear 10/11/2011   Memory change 07/06/2015   Metabolic syndrome    Mixed hyperlipidemia 05/20/2010   Qualifier: Diagnosis of   By: Joshua MD, Ned CROME.        Myofascial pain 12/20/2019   Nephrolithiasis    hx of   NEPHROLITHIASIS, HX OF 05/20/2010   Qualifier: Diagnosis of   By: Joshua MD, Ned CROME.        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   SOB (shortness of breath) 03/31/2017   Transient ischemic attack    hx of   Trochanteric bursitis of right hip     Assessment: Patient Reported Symptoms:  Cognitive Cognitive Status: No symptoms reported, Alert and oriented to person, place, and time, Insightful and able to interpret abstract concepts, Normal speech and language skills Cognitive/Intellectual Conditions Management [RPT]: None reported or documented in medical history or problem list      Neurological Neurological Review of Symptoms: No symptoms reported Neurological Self-Management Outcome: 4 (good)  HEENT HEENT Symptoms Reported:  No symptoms reported HEENT Self-Management Outcome: 4 (good)    Cardiovascular      Respiratory Respiratory Symptoms Reported: Shortness of breath Respiratory Management Strategies: Routine screening Respiratory Self-Management Outcome: 3 (uncertain)  Endocrine Endocrine Symptoms Reported: Shortness of breath Is patient diabetic?: No Endocrine Self-Management Outcome: 3 (uncertain)  Gastrointestinal Gastrointestinal Symptoms Reported: Reflux/heartburn Gastrointestinal Management Strategies: Medication therapy Gastrointestinal Self-Management  Outcome: 4 (good)    Genitourinary Genitourinary Symptoms Reported: No symptoms reported Genitourinary Self-Management Outcome: 4 (good)  Integumentary Integumentary Symptoms Reported: Skin changes Additional Integumentary Details: right side head site reported to be getting better Skin Management Strategies: Routine screening Skin Self-Management Outcome: 4 (good)  Musculoskeletal          Psychosocial Psychosocial Symptoms Reported: Other Additional Psychological Details: hopeful for pending PCS services Behavioral Health Self-Management Outcome: 4 (good)   Quality of Family Relationships: helpful Do you feel physically threatened by others?: No    05/09/2024    PHQ2-9 Depression Screening   Little interest or pleasure in doing things    Feeling down, depressed, or hopeless    PHQ-2 - Total Score    Trouble falling or staying asleep, or sleeping too much    Feeling tired or having little energy    Poor appetite or overeating     Feeling bad about yourself - or that you are a failure or have let yourself or your family down    Trouble concentrating on things, such as reading the newspaper or watching television    Moving or speaking so slowly that other people could have noticed.  Or the opposite - being so fidgety or restless that you have been moving around a lot more than usual    Thoughts that you would be better off dead, or hurting yourself in some way    PHQ2-9 Total Score    If you checked off any problems, how difficult have these problems made it for you to do your work, take care of things at home, or get along with other people    Depression Interventions/Treatment      There were no vitals filed for this visit.  Medications Reviewed Today   Medications were not reviewed in this encounter     Recommendation:   PCP Follow-up Continue Current Plan of Care Continue to work with DSS SW on [facility] CAPs services  Follow Up Plan:   Telephone follow up appointment  date/time:  with Rosaline Finlay 05/21/24 215 pm   Janae Bonser L. Ramonita, RN, BSN, CCM [place]  [facility], [facility] Health RN Care Manager Direct Dial: [phone]  Fax: [phone]

## 2024-05-01 ENCOUNTER — Other Ambulatory Visit: Payer: Self-pay

## 2024-05-01 DIAGNOSIS — R5381 Other malaise: Secondary | ICD-10-CM

## 2024-05-01 DIAGNOSIS — G20A1 Parkinson's disease without dyskinesia, without mention of fluctuations: Secondary | ICD-10-CM

## 2024-05-01 DIAGNOSIS — H9190 Unspecified hearing loss, unspecified ear: Secondary | ICD-10-CM

## 2024-05-01 DIAGNOSIS — Z79899 Other long term (current) drug therapy: Secondary | ICD-10-CM

## 2024-05-01 DIAGNOSIS — Z741 Need for assistance with personal care: Secondary | ICD-10-CM

## 2024-05-02 ENCOUNTER — Ambulatory Visit: Admitting: Neurology

## 2024-05-08 ENCOUNTER — Ambulatory Visit (INDEPENDENT_AMBULATORY_CARE_PROVIDER_SITE_OTHER): Admitting: Psychology

## 2024-05-08 DIAGNOSIS — F331 Major depressive disorder, recurrent, moderate: Secondary | ICD-10-CM

## 2024-05-08 DIAGNOSIS — F419 Anxiety disorder, unspecified: Secondary | ICD-10-CM | POA: Diagnosis not present

## 2024-05-08 DIAGNOSIS — F329 Major depressive disorder, single episode, unspecified: Secondary | ICD-10-CM
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
## 2024-05-09 NOTE — Patient Instructions (Signed)
 Visit Information  Thank you for taking time to visit with me today. Please don't hesitate to contact me if I can be of assistance to you before our next scheduled appointment.  Your next care management appointment is by telephone on 03/27/24 at 330 pm    Please call the care guide team at [phone] if you need to cancel, schedule, or reschedule an appointment.   Please  if you are experiencing a Mental Health or Behavioral Health Crisis or need someone to talk to.  Arianis Bowditch L. Ramonita, RN, BSN, CCM [place]  [facility], [facility] Health RN Care Manager Direct Dial: [phone]  Fax: [phone]

## 2024-05-09 NOTE — Patient Outreach (Signed)
 Error with opening encounter/appt   Mithra Spano L. Ramonita, RN, BSN, CCM [place]  [facility], [facility] Health RN Care Manager Direct Dial: [phone]  Fax: [phone]

## 2024-05-09 NOTE — Patient Instructions (Signed)
 Visit Information  Thank you for taking time to visit with me today. Please don't hesitate to contact me if I can be of assistance to you before our next scheduled appointment.  Your next care management appointment is by telephone on 05/21/24 at 215 pm with Rosaline Finlay RN CM     Please call the care guide team at [phone] if you need to cancel, schedule, or reschedule an appointment.   Please  if you are experiencing a Mental Health or Behavioral Health Crisis or need someone to talk to.   Rowen Wilmer L. Ramonita, RN, BSN, CCM [place]  [facility], [facility] Health RN Care Manager Direct Dial: [phone]  Fax: [phone]

## 2024-05-09 NOTE — Patient Instructions (Addendum)
 Visit Information  Thank you for taking time to visit with me today. Please don't hesitate to contact me if I can be of assistance to you before our next scheduled appointment.  Our next appointment is  on 03/15/24 at 230 pm Please call the care guide team at [phone] if you need to cancel or reschedule your appointment.   Following is a copy of your care plan:   Goals Addressed             This Visit's Progress    Managing depression and food insecurityVBCI RN Care Plan   No change    Problems:  Care Coordination needs related to Depression   and Food Insecurity  Chronic Disease Management support and education needs related to CHF and HTN  Goal: Over the next 3months the Patient will continue to work with RN Care Manager and/or Social Worker to address care management and care coordination needs related to CHF and HTN as evidenced by adherence to care management team scheduled appointments     work with Child psychotherapist to address Limited access to food and Mental Health Concerns  related to the management of CHF and HTN as evidenced by review of electronic medical record and patient or social worker report       Interventions:   Heart Failure Interventions: Provided education on low sodium diet Advised patient to weigh each morning after emptying bladder Discussed importance of daily weight and advised patient to weigh and record daily Discussed the importance of keeping all appointments with provider Referral made to community resources care guide team for assistance with food insecurity;  Hypertension Interventions: Last practice recorded BP readings:  BP Readings from Last 3 Encounters:  03/19/24 135/78  02/26/24 106/61  02/06/24 130/70   Most recent eGFR/CrCl:  Lab Results  Component Value Date   EGFR 87 01/12/2024    No components found for: CRCL  Evaluation of current treatment plan related to hypertension self management and patient's adherence to plan as  established by provider Provided education to patient re: stroke prevention, s/s of heart attack and stroke Reviewed medications with patient and discussed importance of compliance Provided assistance with obtaining home blood pressure monitor via clinical pharmacist; Discussed plans with patient for ongoing care management follow up and provided patient with direct contact information for care management team Advised patient, providing education and rationale, to monitor blood pressure daily and record, calling PCP for findings outside established parameters Discussed complications of poorly controlled blood pressure such as heart disease, stroke, circulatory complications, vision complications, kidney impairment, sexual dysfunction Screening for signs and symptoms of depression related to chronic disease state  Assessed social determinant of health barriers  Patient Self-Care Activities:  Attend all scheduled provider appointments Attend church or other social activities Call pharmacy for medication refills 3-7 days in advance of running out of medications Call provider office for new concerns or questions  Take medications as prescribed   Work with the social worker to address care coordination needs and will continue to work with the clinical team to address health care and disease management related needs  Plan:  Telephone follow up appointment with care management team member scheduled for:  03/15/24             Please call the Suicide and Crisis Lifeline: 988 call the [place]  National Suicide Prevention Lifeline: (985)491-8190 or TTY: 7405103288 TTY (225)660-4144) to talk to a trained counselor call 1-800-273-TALK (toll free, 24 hour hotline) go to [facility] [address] [place] [phone]) call 911 if you are experiencing a Mental Health or Behavioral Health Crisis or need someone to talk to.  Patient verbalizes understanding  of instructions and care plan provided today and agrees to view in MyChart. Active MyChart status and patient understanding of how to access instructions and care plan via MyChart confirmed with patient.     Donielle Kaigler L. Ramonita, RN, BSN, CCM [place]  [facility], [facility] RN Care Manager Direct Dial: [phone]  Fax: [phone]

## 2024-05-10 NOTE — Progress Notes (Signed)
 Cardiology Office Note:  .   Date:  05/14/2024  ID:  Shawn Meza, DOB 1945-12-25, MRN [id number] PCP: Frann Mabel Mt, DO  [facility] Providers Cardiologist:  Lamar Fitch, MD    History of Present Illness: .   Shawn Meza is a 78 y.o. male with a past medical history of HFrEF, CAD s/p CABG x 4 2019, hypertension, LBBB, history of TIA, GERD, history of colon cancer, essential tremor, Parkinson's disease, autonomic dysfunction, dyslipidemia, erectile dysfunction, BPH.  02/24/2023 echo EF 30%, global hypokinesis, mild aortic valve sclerosis 04/13/2022 echo EF 20 to 25%, aortic valve calcified with mild regurgitation and stenosis 01/22/2019 echo EF 35 to 40%, moderate hypokinesis of the left ventricular anteroseptal wall and anterior wall, mild aortic valve regurgitation 07/31/2018 Lexiscan  no ischemia, intermediate risk secondary to decreased EF 06/04/2018 echo EF 35 to 40%, moderate hypokinesis of the anteroseptal and anterior myocardium 04/13/2018 carotid duplex minimal calcified plaque 10/22/2017 CABG  x 4  He underwent CABG x 4 in 2019. Echo in 2019 revealed HFrEF. Evaluated by Dr. Fitch on 01/26/2023, he was stable from a cardiac perspective.  Previous discussions were had surrounding biventricular pacing however he refused this.  He was participating with PT for his Parkinson's disease and repeat echo was arranged secondary to heaviness when he laid supine which revealed EF of 30%, global hypokinesis, mild aortic valve sclerosis--his Entresto  was increased and he was advised to follow-up in 6 months.  Evaluated by myself on 09/28/2023 for follow up if his heart failure and CAD, bothered by fatigue, anxiety and generalized malaise. Started on jardiance  with plans to follow up in 3 months.  Most recently evaluated by myself in June 2025, he had not been able to tolerate Jardiance , dealing with a lot of anxiety/depression, low support system overall he did agree to  restart Jardiance  and try this again and we made plans to follow-up in 3 months.   He presents today for follow-up, has had a relatively stressful few months.  He mentions that his PCP is trying to get him approved for help in the home a few days a week but this has been an ongoing process.  His blood pressure is controlled.  His heart failure symptoms are controlled.  He continues to live alone, he does have a daughter but she is not able to help him very much as she has issues of her own. He denies chest pain, palpitations, dyspnea, pnd, orthopnea, n, v, dizziness, syncope, edema, weight gain, or early satiety.   ROS: Review of Systems  Constitutional:  Positive for malaise/fatigue.  Neurological:  Positive for tremors.  Psychiatric/Behavioral:  Positive for depression. Negative for suicidal ideas.      Studies Reviewed: .        Cardiac Studies & Procedures   ______________________________________________________________________________________________   STRESS TESTS  MYOCARDIAL PERFUSION IMAGING 07/31/2018  Interpretation Summary  Nuclear stress EF: 27%.  Blood pressure demonstrated a normal response to exercise.  Defect 1: There is a medium defect of severe severity present in the basal inferoseptal, basal inferior, mid inferoseptal, mid inferior and apical inferior location.  Findings consistent with prior myocardial infarction.  This is an intermediate risk study.  The left ventricular ejection fraction is severely decreased (<30%).  Cardiomegaly with global hypokinesis. Akinesis of the inferior and infero - septal wall. Diminished LVEF - 27%. No evidence of ischemia.   ECHOCARDIOGRAM  ECHOCARDIOGRAM COMPLETE 02/24/2023  Narrative ECHOCARDIOGRAM REPORT    Patient Name:   Shawn Meza  [place] Date of Exam: 02/24/2023 Medical Rec #:  [id number]      Height:       67.0 in Accession #:    [id number]     Weight:       203.6 lb Date of Birth:  1946/07/07      BSA:           2.038 m Patient Age:    57 years       BP:           128/72 mmHg Patient Gender: M              HR:           92 bpm. Exam Location:  [place]  Procedure: 2D Echo, Cardiac Doppler, Color Doppler and Intracardiac Opacification Agent  Indications:    Coronary artery disease involving native coronary artery of native heart with angina pectoris (HCC) [I25.119 (ICD-10-CM)]; Ischemic cardiomyopathy [I25.5 (ICD-10-CM)]  History:        Patient has prior history of Echocardiogram examinations, most recent 04/13/2022. Cardiomyopathy and CHF, CAD; Prior CABG. Parkinson's disease.  Sonographer:    Charlie Jointer RDCS Referring Phys: [id number] LAMAR JINNY FITCH   Sonographer Comments: Technically difficult study due to poor echo windows. IMPRESSIONS   1. Left ventricular ejection fraction, by estimation, is 30%%. The left ventricle has moderately decreased function. The left ventricle demonstrates global hypokinesis. Left ventricular diastolic parameters are indeterminate. 2. Right ventricular systolic function is moderately reduced. The right ventricular size is normal. 3. The mitral valve is degenerative. No evidence of mitral valve regurgitation. No evidence of mitral stenosis. 4. The aortic valve is tricuspid. Aortic valve regurgitation is mild. Aortic valve sclerosis is present, with no evidence of aortic valve stenosis. 5. Aortic DTA is NWV.  FINDINGS Left Ventricle: Left ventricular ejection fraction, by estimation, is 30%%. The left ventricle has moderately decreased function. The left ventricle demonstrates global hypokinesis. Definity  contrast agent was given IV to delineate the left ventricular endocardial borders. The left ventricular internal cavity size was normal in size. There is no left ventricular hypertrophy. Left ventricular diastolic parameters are indeterminate.  Right Ventricle: The right ventricular size is normal. No increase in right ventricular wall thickness. Right  ventricular systolic function is moderately reduced.  Left Atrium: Left atrial size was normal in size.  Right Atrium: Right atrial size was normal in size.  Pericardium: There is no evidence of pericardial effusion.  Mitral Valve: The mitral valve is degenerative in appearance. Mild mitral annular calcification. No evidence of mitral valve regurgitation. No evidence of mitral valve stenosis.  Tricuspid Valve: The tricuspid valve is normal in structure. Tricuspid valve regurgitation is not demonstrated. No evidence of tricuspid stenosis.  Aortic Valve: The aortic valve is tricuspid. Aortic valve regurgitation is mild. Aortic valve sclerosis is present, with no evidence of aortic valve stenosis.  Pulmonic Valve: The pulmonic valve was normal in structure. Pulmonic valve regurgitation is not visualized. No evidence of pulmonic stenosis.  Aorta: The aortic arch was not well visualized, the aortic root and ascending aorta are structurally normal, with no evidence of dilitation and DTA is NWV.  Venous: The pulmonary veins were not well visualized. The inferior vena cava was not well visualized.  IAS/Shunts: No atrial level shunt detected by color flow Doppler.   LEFT VENTRICLE PLAX 2D LVIDd:         5.50 cm   Diastology LVIDs:         4.55 cm   LV  e' medial:    8.16 cm/s LV PW:         1.15 cm   LV E/e' medial:  15.3 LV IVS:        1.20 cm   LV e' lateral:   8.59 cm/s LVOT diam:     2.20 cm   LV E/e' lateral: 14.6 LV SV:         55 LV SV Index:   27 LVOT Area:     3.80 cm   RIGHT VENTRICLE RV Basal diam:  2.20 cm RV Mid diam:    1.70 cm RV S prime:     8.05 cm/s TAPSE (M-mode): 1.2 cm  LEFT ATRIUM             Index        RIGHT ATRIUM          Index LA diam:        4.20 cm 2.06 cm/m   RA Area:     9.25 cm LA Vol (A2C):   38.0 ml 18.65 ml/m  RA Volume:   16.70 ml 8.20 ml/m LA Vol (A4C):   33.2 ml 16.29 ml/m LA Biplane Vol: 38.0 ml 18.65 ml/m AORTIC VALVE LVOT Vmax:    83.80 cm/s LVOT Vmean:  55.567 cm/s LVOT VTI:    0.146 m  AORTA Ao Root diam: 3.60 cm  MITRAL VALVE MV Area (PHT): 4.29 cm     SHUNTS MV Decel Time: 177 msec     Systemic VTI:  0.15 m MV E velocity: 125.00 cm/s  Systemic Diam: 2.20 cm  Redell Leiter MD Electronically signed by Redell Leiter MD Signature Date/Time: 02/24/2023/4:53:14 PM    Final          ______________________________________________________________________________________________      Risk Assessment/Calculations:             Physical Exam:   VS:  BP 114/70   Pulse 80   Ht 5' 6 (1.676 m)   Wt 210 lb 9.6 oz (95.5 kg)   SpO2 96%   BMI 33.99 kg/m    Wt Readings from Last 3 Encounters:  05/14/24 210 lb 9.6 oz (95.5 kg)  04/30/24 211 lb 9.6 oz (96 kg)  02/26/24 205 lb (93 kg)    GEN: Well nourished, well developed in no acute distress NECK: No JVD; No carotid bruits CARDIAC: RRR, no murmurs, rubs, gallops RESPIRATORY:  Clear to auscultation without rales, wheezing or rhonchi  ABDOMEN: Soft, non-tender, non-distended EXTREMITIES:  No edema; No deformity   ASSESSMENT AND PLAN: .   HFrEF -NYHA class II, euvolemic.  Most recent echo in July 2024 revealed an EF of 30%.  He previously refused biventricular device.  Continue Entresto  49-51 mg twice daily, continue metoprolol  25 mg daily.  Continue Jardiance  10 mg daily.  Continue Lasix  as needed for pedal edema or shortness of breath.  Lab work 04/18/2024 potassium 4.5, creatinine 0.96.  CAD -CABG x 4 in 2019 >> Lexiscan  later that year revealed no ischemia.  He is not having any anginal complaints today.  Continue aspirin  81 mg daily, continue metoprolol  25 mg daily, continue nitroglycerin  as needed, continue Pravachol  20 mg daily.   Dyslipidemia-Previously intolerant of rosuvastatin . Most recent LDL was elevated at 127 and it was previously well controlled, being monitored by his PCP it appears.  Currently on Pravachol .  Hypertension-blood pressure is  well-controlled today at 114/70.  Continue Entresto  49-51 mg twice daily, continue metoprolol  25 mg daily.  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 Denies HI/SI, but continues to feel very low. Encouraged him to follow up with his MD if he thinks his symptoms are not well controlled.        Dispo:  3 month follow up per his request.  Signed, Delon JAYSON Hoover, NP

## 2024-05-14 ENCOUNTER — Encounter: Payer: Self-pay | Admitting: Cardiology

## 2024-05-14 ENCOUNTER — Telehealth: Payer: Self-pay

## 2024-05-14 ENCOUNTER — Ambulatory Visit: Attending: Cardiology | Admitting: Cardiology

## 2024-05-14 VITALS — BP 114/70 | HR 80 | Ht 66.0 in | Wt 210.6 lb

## 2024-05-14 DIAGNOSIS — E782 Mixed hyperlipidemia: Secondary | ICD-10-CM | POA: Diagnosis not present

## 2024-05-14 DIAGNOSIS — I25119 Atherosclerotic heart disease of native coronary artery with unspecified angina pectoris: Secondary | ICD-10-CM | POA: Diagnosis not present

## 2024-05-14 DIAGNOSIS — Z951 Presence of aortocoronary bypass graft: Secondary | ICD-10-CM

## 2024-05-14 DIAGNOSIS — I5020 Unspecified systolic (congestive) heart failure: Secondary | ICD-10-CM | POA: Diagnosis not present

## 2024-05-14 DIAGNOSIS — I10 Essential (primary) hypertension: Secondary | ICD-10-CM | POA: Diagnosis not present

## 2024-05-14 NOTE — Patient Instructions (Signed)
 Medication Instructions:  Your physician recommends that you continue on your current medications as directed. Please refer to the Current Medication list given to you today.  *If you need a refill on your cardiac medications before your next appointment, please call your pharmacy*  Lab Work: NONE If you have labs (blood work) drawn today and your tests are completely normal, you will receive your results only by: MyChart Message (if you have MyChart) OR A paper copy in the mail If you have any lab test that is abnormal or we need to change your treatment, we will call you to review the results.  Testing/Procedures: NONE  Follow-Up: At [facility], you and your health needs are our priority.  As part of our continuing mission to provide you with exceptional heart care, our providers are all part of one team.  This team includes your primary Cardiologist (physician) and Advanced Practice Providers or APPs (Physician Assistants and Nurse Practitioners) who all work together to provide you with the care you need, when you need it.  Your next appointment:   3 month(s)  Provider:   Gypsy Balsam, MD    We recommend signing up for the patient portal called "MyChart".  Sign up information is provided on this After Visit Summary.  MyChart is used to connect with patients for Virtual Visits (Telemedicine).  Patients are able to view lab/test results, encounter notes, upcoming appointments, etc.  Non-urgent messages can be sent to your provider as well.   To learn more about what you can do with MyChart, go to [email or web address].   Other Instructions

## 2024-05-14 NOTE — Telephone Encounter (Signed)
 Called pt Lvm letting him know we tried putting referral for nurse and insurance not covering but only for NA.

## 2024-05-21 ENCOUNTER — Other Ambulatory Visit: Payer: Self-pay

## 2024-05-21 NOTE — Progress Notes (Unsigned)
 Assessment/Plan:   1.  Parkinsons Disease  -continue rytary  245 mg four times per day.  Clinically, he is always worse when he is off of levodopa .  Compliance has always been a challenge here.  However, he has been refilling the medication faithfully for the last 3 months, even though it was not faithful before that.  -restart PT.  Order sent.  Exercise encouraged and discussed  - He is following with dermatology, Dr. Paci  2.  Depression  -on cilatopram  -Continues to follow with Dr. Gutterman and saw him yesterday  3.  B12 deficiency  -On supplementation.  4.  Lumbar spinal stenosis  -has followed with Dr. Unice and [place]  -Has seen Dr. Chick  5.  Ankle pain  - Discussed with patient that this is unassociated with Parkinsons disease and he may need a sports medicine physician.  He is going to talk about this with his primary care physician again as he has a follow-up appointment.  He has already seen him about this.     Subjective:   Shawn Meza was seen today in follow up for Parkinsons disease.  My previous records were reviewed prior to todays visit as well as outside records available to me.  Patient is supposed to be on Rytary  245 mg 34 times per day, increased last visit.  Dispense history was reviewed.  30-day supplies of the medication were dispensed from his pharmacy April 1, July 24, August 26, September 29.  No falls.  No hallucinations but has some visual distortions.  he c/o L ankle pain.  Not exercising due to foot and ankle pain.  Separately, he recently sprayed some flexispray/water sealant and got it all over his body and has been frustrated on the fact that it has been very difficult to remove and has been to multiple doctors about it and asks me about it today.    Current prescribed movement disorder medications: Rytary , 245 mg, 1 po qid (increased) B12 supplement Melatonin, 3 mg  Prior medications: Carbidopa /levodopa  25/100 IR: Carbidopa /levodopa  25/100  CR: Carbidopa /levodopa  50/200 CR; rytary  (c/o dry mouth, trouble breathing at night and constipation); Trintellix  (he thought it caused dreams, even though we told him that it was the Parkinsons that was causing the dreaming)   ALLERGIES:   Allergies  Allergen Reactions   Tizanidine  Hcl Hives   Fluoxetine  Other (See Comments)    Caused depression and aggression   Rosuvastatin  Other (See Comments)    Whole body aches   Testosterone Other (See Comments)    ABDOMINAL PAIN and cramping   Ropinirole  Hcl Nausea Only   Requip  [Ropinirole ] Nausea Only    CURRENT MEDICATIONS:  Outpatient Encounter Medications as of 05/23/2024  Medication Sig   acetaminophen  (TYLENOL ) 325 MG tablet Take 162.5 mg by mouth every 6 (six) hours as needed for mild pain or moderate pain.   aspirin  81 MG EC tablet Take 1 tablet (81 mg total) by mouth daily.   azelastine  (ASTELIN ) 0.1 % nasal spray Place 2 sprays into both nostrils 2 (two) times daily. Use in each nostril as directed   Carbidopa -Levodopa  ER (RYTARY ) 61.25-245 MG CPCR TAKE 1 CAPSULE BY MOUTH 4 TIMES A DAY, TAKE THE LAST CAPSULE AT BEDTIME. (PARKINSON'S)   citalopram  (CELEXA ) 20 MG tablet TAKE 1 TABLET (20 MG TOTAL) BY MOUTH DAILY.   empagliflozin  (JARDIANCE ) 10 MG TABS tablet Take 10 mg by mouth daily.   esomeprazole  (NEXIUM ) 40 MG capsule Take 1 capsule (40 mg total)  by mouth daily.   fluticasone  (FLONASE ) 50 MCG/ACT nasal spray Place 2 sprays into both nostrils daily.   furosemide  (LASIX ) 20 MG tablet Take 1 tablet (20 mg total) by mouth as needed for fluid or edema (shortness of breath).   gabapentin  (NEURONTIN ) 100 MG capsule TAKE 1 CAPSULE (100 MG TOTAL) BY MOUTH AT BEDTIME. NERVE PAIN   hydrocortisone-pramoxine (ANALPRAM HC) 2.5-1 % rectal cream 1 appful rectally 3 times a day; Duration: 7 day(s)   levocetirizine (XYZAL ) 5 MG tablet Take 1 tablet (5 mg total) by mouth every evening. For allergies   metoprolol  tartrate (LOPRESSOR ) 25 MG tablet Take  1 tablet (25 mg total) by mouth daily.   montelukast  (SINGULAIR ) 10 MG tablet Take 1 tablet (10 mg total) by mouth at bedtime.   nitroGLYCERIN  (NITROSTAT ) 0.4 MG SL tablet Place 1 tablet (0.4 mg total) under the tongue every 5 (five) minutes x 3 doses as needed for chest pain. If chest pain is not relieved after 2nd dose - call 911.   pravastatin  (PRAVACHOL ) 80 MG tablet Take 1 tablet (80 mg total) by mouth every evening.   QVAR  REDIHALER 40 MCG/ACT inhaler INHALE 2 PUFFS INTO THE LUNGS 2 TIMES DAILY. (BREATHING)   sacubitril -valsartan  (ENTRESTO ) 49-51 MG TAKE 1 TABLET BY MOUTH 2 TIMES DAILY.   No facility-administered encounter medications on file as of 05/23/2024.    Objective:   PHYSICAL EXAMINATION:    VITALS:   Vitals:   05/23/24 1421  BP: 118/62  Pulse: 74  SpO2: 96%  Weight: 208 lb (94.3 kg)  Height: 5' 6 (1.676 m)    GEN:  The patient appears stated age and is in NAD. HEENT:  Normocephalic, atraumatic.  The mucous membranes are moist.  Cardiovascular: Regular rate rhythm Lungs: Clear to auscultation bilaterally  Neurological examination:  Orientation: The patient is alert and oriented x3. Cranial nerves: There is good facial symmetry with facial hypomimia.  He does have left pseudoptosis.  The speech is fluent and clear. Soft palate rises symmetrically and there is no tongue deviation. Hearing is intact to conversational tone. Sensation: Sensation is intact to light touch throughout Motor: Strength is at least antigravity x4.  Movement examination: Tone: There is nl tone in the LUE/LLE Abnormal movements: there is mild LUE rest tremor Coordination:  There is mild decremation with finger taps and toe taps on the L Gait and Station: The patient is a bit slow to arise.  Patient has decreased arm swing on the L.  He is forward flexed.  His gait is antalgic due to left ankle pain.  This is stable from prior visits.  I have reviewed and interpreted the following labs  independently    Chemistry      Component Value Date/Time   NA 142 04/18/2024 1321   NA 145 (H) 09/28/2023 1209   K 4.5 04/18/2024 1321   CL 106 04/18/2024 1321   CO2 25 04/18/2024 1321   BUN 16 04/18/2024 1321   BUN 16 09/28/2023 1209   CREATININE 0.94 04/18/2024 1321   CREATININE 0.90 01/12/2024 1551      Component Value Date/Time   CALCIUM  9.2 04/18/2024 1321   ALKPHOS 65 11/29/2023 1621   AST 19 01/12/2024 1551   ALT 12 01/12/2024 1551   BILITOT 0.5 01/12/2024 1551   BILITOT 0.4 09/28/2023 1209       Lab Results  Component Value Date   WBC 8.2 04/18/2024   HGB 15.3 04/18/2024   HCT 45.2 04/18/2024  MCV 96.0 04/18/2024   PLT 172 04/18/2024    Lab Results  Component Value Date   TSH 4.19 01/12/2024   Total time spent on today's visit was 31 minutes, including both face-to-face time and nonface-to-face time.  Time included that spent on review of records (prior notes available to me/labs/imaging if pertinent), discussing treatment and goals, answering patient's questions and coordinating care.   Cc:  Frann Mabel Mt, DO

## 2024-05-21 NOTE — Telephone Encounter (Signed)
 Called spoke with jerry with social worker with [employer], about paperwork. Christopher stated he will call back  to discuss more about paperwork.

## 2024-05-21 NOTE — Patient Outreach (Signed)
 Complex Care Management   Visit Note  05/21/2024  Name:  Shawn Meza MRN: [id number] DOB: [birth year]  Situation: Referral received for Complex Care Management related to Parkinson's disease I obtained verbal consent from Patient.  Visit completed with Patient  on the phone  Background:   Past Medical History:  Diagnosis Date   Abscess of right axilla 12/12/2017   Acute blood loss anemia 10/25/2017   Acute postoperative respiratory insufficiency 10/25/2017   Aortic regurgitation 09/16/2018   Arthritis    BMI 33.0-33.9,adult 12/12/2017   Cardiomyopathy, unspecified (HCC) 06/13/2018   Chest pain in adult 10/11/2017   Chronic coronary artery disease    Chronic systolic (congestive) heart failure (HCC) 09/18/2018   Colon cancer (HCC)    Coronary artery disease involving native coronary artery of native heart with angina pectoris 12/28/2010   Added automatically from request for surgery [id number]     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        GERD (gastroesophageal reflux disease)    Hiatal hernia    History of cardiovascular disorder 05/20/2010   Qualifier: Diagnosis of   By: Joshua MD, Ned CROME.     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        Ischemic cardiomyopathy 09/16/2018   LBBB (left bundle branch block)    Long term current use of aspirin  03/29/2017   Long-term use of aspirin  therapy    Low back pain 10/28/2019   Lumbar spondylosis 06/29/2022   Major depression, chronic    Major depressive disorder, recurrent  episode, moderate (HCC) 06/30/2022   Medial meniscus tear 10/11/2011   Memory change 07/06/2015   Metabolic syndrome    Mixed hyperlipidemia 05/20/2010   Qualifier: Diagnosis of   By: Joshua MD, Ned CROME.        Myofascial pain 12/20/2019   Nephrolithiasis    hx of   NEPHROLITHIASIS, HX OF 05/20/2010   Qualifier: Diagnosis of   By: Joshua MD, Ned CROME.        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   SOB (shortness of breath) 03/31/2017   Transient ischemic attack    hx of   Trochanteric bursitis of right hip     Assessment: Patient Reported Symptoms:  Cognitive Cognitive Status: Able to follow simple commands, Alert and oriented to person, place, and time, Normal speech and language skills Cognitive/Intellectual Conditions Management [RPT]: None reported or documented in medical history or problem list      Neurological Neurological Review of Symptoms: Weakness Neurological Management Strategies: Medication therapy, Routine screening Neurological Comment: Reminded patient of upcoming neurology appointment 05/23/24 for Parkinson's managment  HEENT HEENT Symptoms Reported: Not assessed      Cardiovascular Cardiovascular Symptoms  Reported: No symptoms reported Does patient have uncontrolled Hypertension?: No Cardiovascular Management Strategies: Medication therapy, Routine screening  Respiratory Respiratory Symptoms Reported: Not assesed    Endocrine Endocrine Symptoms Reported: Not assessed Is patient diabetic?: No    Gastrointestinal Gastrointestinal Symptoms Reported: Not assessed      Genitourinary Genitourinary Symptoms Reported: Not assessed    Integumentary Integumentary Symptoms Reported: Not assessed     Musculoskeletal Musculoskelatal Symptoms Reviewed: Other, Weakness Other Musculoskeletal Symptoms: Note per chart review, patient had recent PCP visit for L foot pain. Patient reports pain has continued. Patient reports he tried to perform exercises provided by PCP. He reports he has tried Tylenol , which provides a little bit of relief as well as resting the foot. Musculoskeletal Management Strategies: Adequate rest, Routine screening, Exercise, Medication therapy Musculoskeletal Comment: Patient denies falls since previous CMRN visit Falls in the past year?: No Number of falls in past year: 1 or less Was there an injury with Fall?: No Fall Risk Category Calculator: 0 Patient Fall Risk Level: Low Fall Risk Patient at Risk for Falls Due to: Impaired balance/gait, Impaired mobility Fall risk Follow up: Falls evaluation completed, Education provided, Falls prevention discussed  Psychosocial Psychosocial Symptoms Reported: Other Other Psychosocial Conditions: Patient reports he continues to follow regularly with behavioral health provider. He reports this is working well for him. Patient states it's been the best it has been in regards to his depression. Behavioral Management Strategies: Adequate rest, Coping strategies, Counseling, Medication therapy, Support system Major Change/Loss/Stressor/Fears (CP): Medical condition, self, Separation or divorce Techniques to Cope with Loss/Stress/Change: Medication, Diversional activities, Counseling      05/21/2024    PHQ2-9 Depression Screening   Little interest or pleasure in doing things    Feeling down, depressed, or hopeless    PHQ-2 - Total Score    Trouble falling or staying asleep, or sleeping too much    Feeling tired or having little energy    Poor appetite or overeating     Feeling bad about yourself - or that you are a failure or have let yourself or your family down    Trouble concentrating on things, such as reading the newspaper  or watching television    Moving or speaking so slowly that other people could have noticed.  Or the opposite - being so fidgety or restless that you have been moving around a lot more than usual    Thoughts that you would be better off dead, or hurting yourself in some way    PHQ2-9 Total Score    If you checked off any problems, how difficult have these problems made it for you to do your work, take care of things at home, or get along with other people    Depression Interventions/Treatment      There were no vitals filed for this visit.  Medications Reviewed Today     Reviewed by Arno Rosaline SQUIBB, RN (Registered Nurse) on 05/21/24 at 1432  Med List Status: <None>   Medication Order Taking? Sig Documenting Provider Last Dose Status Informant  acetaminophen  (TYLENOL ) 325 MG tablet [id number]  Take 162.5 mg by mouth every 6 (six) hours as needed for mild pain or moderate pain. [provider]  Active   aspirin  81 MG EC tablet [id number]  Take 1 tablet (81 mg total) by mouth daily. Frann Mabel Mt, DO  Active   azelastine  (ASTELIN ) 0.1 % nasal spray [id number]  Place 2 sprays into both nostrils 2 (two) times daily. Use in  each nostril as directed Frann Mabel Mt, DO  Active   Carbidopa -Levodopa  ER (RYTARY ) 61.25-245 MG CPCR [id number]  TAKE 1 CAPSULE BY MOUTH 4 TIMES A DAY, TAKE THE LAST CAPSULE AT BEDTIME. (PARKINSON'S) Leigh Venetia CROME, MD  Active   citalopram  (CELEXA ) 20 MG tablet [id number]  TAKE 1 TABLET (20 MG TOTAL) BY MOUTH DAILY. Frann Mabel Mt, DO  Active   empagliflozin  (JARDIANCE ) 10 MG TABS tablet [id number]  Take 10 mg by mouth daily. [provider]  Active   esomeprazole  (NEXIUM ) 40 MG capsule [id number]  Take 1 capsule (40 mg total) by mouth daily. Frann Mabel Mt, DO  Active   fluticasone  (FLONASE ) 50 MCG/ACT nasal spray [id number]  Place 2 sprays into both nostrils daily. Frann Mabel Mt, DO  Active   furosemide  (LASIX ) 20  MG tablet [id number]  Take 1 tablet (20 mg total) by mouth as needed for fluid or edema (shortness of breath). Frann Mabel Mt, DO  Active   gabapentin  (NEURONTIN ) 100 MG capsule [id number]  TAKE 1 CAPSULE (100 MG TOTAL) BY MOUTH AT BEDTIME. NERVE PAIN Frann Mabel Mt, DO  Active   hydrocortisone-pramoxine [facility]) 2.5-1 % rectal cream [id number]  1 appful rectally 3 times a day; Duration: 7 day(s) [provider]  Active   levocetirizine (XYZAL ) 5 MG tablet [id number]  Take 1 tablet (5 mg total) by mouth every evening. For allergies Frann Mabel Mt, DO  Active   metoprolol  tartrate (LOPRESSOR ) 25 MG tablet [id number]  Take 1 tablet (25 mg total) by mouth daily. Frann Mabel Mt, DO  Active   montelukast  (SINGULAIR ) 10 MG tablet [id number]  Take 1 tablet (10 mg total) by mouth at bedtime. Frann Mabel Mt, DO  Active   nitroGLYCERIN  (NITROSTAT ) 0.4 MG SL tablet [id number]  Place 1 tablet (0.4 mg total) under the tongue every 5 (five) minutes x 3 doses as needed for chest pain. If chest pain is not relieved after 2nd dose - call 911. Frann Mabel Mt, DO  Active   pravastatin  (PRAVACHOL ) 80 MG tablet [id number]  Take 1 tablet (80 mg total) by mouth every evening. Carlin Delon BROCKS, NP  Expired 05/16/24 2359   QVAR  REDIHALER 40 MCG/ACT inhaler [id number]  INHALE 2 PUFFS INTO THE LUNGS 2 TIMES DAILY. (BREATHING) Frann Mabel Mt, DO  Active   sacubitril -valsartan  (ENTRESTO ) 49-51 MG [id number]  TAKE 1 TABLET BY MOUTH 2 TIMES DAILY. Krasowski, Robert J, MD  Active             Recommendation:   Continue Current Plan of Care Continue to communicate with Shawn Meza (DSS worker for CAP program)   Follow Up Plan:   Closing From:  Complex Care Management Patient has been connected with Care Navigator through [facility]SNP. Patient's care navigator is Vermell, contact number [phone] extension [phone]  Rosaline Finlay, RN MSN [place]  [facility]  Health RN Care Manager Direct Dial: [phone]  Fax: [phone]

## 2024-05-21 NOTE — Telephone Encounter (Unsigned)
 Copied from CRM [id number]. 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 Calling back in regards to the faxed documents.pcp needs to complete form for pt to start services immediately   Pt needs home health aid, he lives alone. This has been a ongoing mtter, pt has medicaid. Jerrys contact # [phone], [phone] office #

## 2024-05-21 NOTE — Patient Instructions (Signed)
 Visit Information  Thank you for taking time to visit with me today. Please don't hesitate to contact me if I can be of assistance to you before our next scheduled appointment.  Your next care management appointment is no further scheduled appointments.   You have been connected with Care Navigator through [facility]-SNP. Your care navigator is Vermell, contact number [phone] extension [phone]  Please call the care guide team at [phone] if you need to cancel, schedule, or reschedule an appointment.   Please call the Suicide and Crisis Lifeline: 988 call 1-800-273-TALK (toll free, 24 hour hotline) if you are experiencing a Mental Health or Behavioral Health Crisis or need someone to talk to.  Rosaline Finlay, RN MSN [place]  [facility] Population Health RN Care Manager Direct Dial: [phone]  Fax: [phone]

## 2024-05-22 ENCOUNTER — Ambulatory Visit (INDEPENDENT_AMBULATORY_CARE_PROVIDER_SITE_OTHER): Admitting: Psychology

## 2024-05-22 DIAGNOSIS — F419 Anxiety disorder, unspecified: Secondary | ICD-10-CM | POA: Diagnosis not present

## 2024-05-22 DIAGNOSIS — F331 Major depressive disorder, recurrent, moderate: Secondary | ICD-10-CM | POA: Diagnosis not present
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
## 2024-05-23 ENCOUNTER — Encounter: Payer: Self-pay | Admitting: Neurology

## 2024-05-23 ENCOUNTER — Ambulatory Visit (INDEPENDENT_AMBULATORY_CARE_PROVIDER_SITE_OTHER): Admitting: Neurology

## 2024-05-23 VITALS — BP 118/62 | HR 74 | Ht 66.0 in | Wt 208.0 lb

## 2024-05-23 DIAGNOSIS — M25572 Pain in left ankle and joints of left foot: Secondary | ICD-10-CM

## 2024-05-23 DIAGNOSIS — G20A1 Parkinson's disease without dyskinesia, without mention of fluctuations: Secondary | ICD-10-CM | POA: Diagnosis not present

## 2024-05-23 NOTE — Patient Instructions (Signed)
 Here are some resources/books that you may find helpful as you navigate the challenges of Parkinson's Disease  1.  Parkinson's treatement: 10 secrets to a happier life by Jefferey Pica, MD 2.  Navigating Life with Parkinsons disease by Sotirios Parashos 3.  My degeneration: A journey through Parkinsons Ledora Bottcher - Shohl) 4.  Every Victory counts (I believe this one if free through [employer]) 5.  Lucky Man by Gardner Candle 6.  101 Questions & Answers about Parkinson's by Caprice Renshaw 7.  Parkinsons Disease Treatment Book by JE Ahiskog

## 2024-05-24 ENCOUNTER — Encounter: Payer: Self-pay | Admitting: Neurology

## 2024-05-27 ENCOUNTER — Telehealth: Payer: Self-pay | Admitting: Pharmacist

## 2024-05-27 ENCOUNTER — Telehealth: Payer: Self-pay | Admitting: Family Medicine

## 2024-05-27 ENCOUNTER — Other Ambulatory Visit

## 2024-05-27 NOTE — Telephone Encounter (Signed)
 Copied from CRM [id number]. Topic: General - Call Back - No Documentation >> May 27, 2024 11:17 AM Rea BROCKS wrote: Reason for CRM: Christopher Squibb Social Worker [facility] department social services   [phone] call back    Christopher last spoke with CMA Sharmaine in the office.   Doctors office was supposed to issue a form for patient to receive psc services at home. Christopher stated he spoke with the state office and they have not received the form. DHB [id number] for [facility] Services

## 2024-05-27 NOTE — Telephone Encounter (Signed)
 Attempt was made to contact patient by phone today for follow up by Clinical Pharmacist regarding medication management and adherence  Unable to reach patient. LM on VM with my contact number [phone].

## 2024-05-29 NOTE — Telephone Encounter (Signed)
 Returned call back LVM for Shawn Meza with [facility] Department SS @ [facility] [phone] for [facility] Services  Letting him know forms was refax.

## 2024-06-03 ENCOUNTER — Other Ambulatory Visit

## 2024-06-03 NOTE — Telephone Encounter (Signed)
 Second unsuccessful attempt was made to contact patient by phone today for follow up by Clinical Pharmacist regarding medication adherence.  Unable to reach patient. LM on VM with my contact number [phone].

## 2024-06-04 ENCOUNTER — Ambulatory Visit: Payer: Self-pay

## 2024-06-04 NOTE — Telephone Encounter (Signed)
 FYI Only or Action Required?: FYI only for provider: appointment scheduled on 06/05/2024.  Patient was last seen in primary care on 04/30/2024 by Frann Mabel Mt, DO.  Called Nurse Triage reporting Foot Pain.  Symptoms began several months ago.  Interventions attempted: OTC medications: Tylenol  , Rest, hydration, or home remedies, and Ice/heat application.  Symptoms are: unchanged.  Triage Disposition: See Physician Within 24 Hours  Patient/caregiver understands and will follow disposition?: Yes           Copied from CRM #[id number]. Topic: Clinical - Red Word Triage >> Jun 04, 2024 10:14 AM Harlene ORN wrote: Red Word that prompted transfer to Nurse Triage: foot pain getting worse, even with the RICE method; not able to walk or put weight on his left foot Reason for Disposition  Numbness (i.e., loss of sensation) in foot or toes  (Exception: Just tingling; numbness present > 2 weeks.)  Answer Assessment - Initial Assessment Questions 1. ONSET: When did the pain start?      6-8 weeks  2. LOCATION: Where is the pain located?      L foot  3. PAIN: How bad is the pain?    (Scale 1-10; or mild, moderate, severe)     Severe  4. OTHER SYMPTOMS: Do you have any other symptoms? (e.g., leg pain, rash, fever, numbness)     Numbness in foot that feels like a  hot iron    Used nerve pain medication and tylenol  for symptoms. States he used RICE method as well for symptoms. He states he is unable to bear weight on foot due to pain.  Protocols used: Foot Pain-A-AH

## 2024-06-04 NOTE — Telephone Encounter (Signed)
 Appt scheduled

## 2024-06-05 ENCOUNTER — Ambulatory Visit (INDEPENDENT_AMBULATORY_CARE_PROVIDER_SITE_OTHER): Admitting: Family

## 2024-06-05 ENCOUNTER — Encounter: Payer: Self-pay | Admitting: Family

## 2024-06-05 VITALS — BP 133/83 | HR 88 | Temp 97.9°F | Resp 14 | Ht 67.0 in | Wt 210.0 lb

## 2024-06-05 DIAGNOSIS — M79672 Pain in left foot: Secondary | ICD-10-CM | POA: Diagnosis not present

## 2024-06-05 NOTE — Progress Notes (Unsigned)
 Subjective:     Patient ID: Shawn Meza, male    DOB: 15-Mar-1946, 79 y.o.   MRN: [id number]  Chief Complaint  Patient presents with   Foot Pain    Patient is here for left foot pain    HPI  Discussed the use of AI scribe software for clinical note transcription with the patient, who gave verbal consent to proceed.  History of Present Illness  Shawn Meza is an 78 year old male who presents with severe foot pain.  He experiences severe foot pain, described as worse than a toothache, persistent, and radiating to his toes. Sneezing exacerbates the pain, resembling a muscle pull in the toe. The pain intensified after tripping while exiting a car. A large bruised area is present on the foot, but he does not recall specific trauma. An x-ray showed no fractures. He lives alone and finds it difficult to perform daily activities such as cooking and shaving due to the pain. He has no history of gout and no family history of the condition. He normally ambulates well when not experiencing foot pain.     Health Maintenance Due  Topic Date Due   Influenza Vaccine  03/01/2024    Past Medical History:  Diagnosis Date   Abscess of right axilla 12/12/2017   Acute blood loss anemia 10/25/2017   Acute postoperative respiratory insufficiency 10/25/2017   Aortic regurgitation 09/16/2018   Arthritis    BMI 33.0-33.9,adult 12/12/2017   Cardiomyopathy, unspecified (HCC) 06/13/2018   Chest pain in adult 10/11/2017   Chronic coronary artery disease    Chronic systolic (congestive) heart failure (HCC) 09/18/2018   Colon cancer (HCC)    Coronary artery disease involving native coronary artery of native heart with angina pectoris 12/28/2010   Added automatically from request for surgery [id number]     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        GERD (gastroesophageal reflux disease)    Hiatal hernia    History of cardiovascular disorder 05/20/2010   Qualifier: Diagnosis of   By: Joshua MD, Ned CROME.     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        Ischemic cardiomyopathy 09/16/2018   LBBB (left bundle branch block)    Long term current use of aspirin  03/29/2017   Long-term use of aspirin  therapy    Low back pain 10/28/2019   Lumbar spondylosis 06/29/2022   Major depression, chronic    Major depressive disorder, recurrent episode, moderate (HCC) 06/30/2022   Medial meniscus tear 10/11/2011   Memory change 07/06/2015   Metabolic syndrome    Mixed hyperlipidemia 05/20/2010   Qualifier: Diagnosis of   By: Joshua MD, Ned CROME.        Myofascial pain 12/20/2019   Nephrolithiasis    hx of   NEPHROLITHIASIS, HX OF 05/20/2010   Qualifier: Diagnosis of   By: Joshua MD, Ned CROME.        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   SOB (shortness of breath) 03/31/2017   Transient ischemic attack    hx of   Trochanteric bursitis of right hip     Past Surgical History:  Procedure Laterality Date   CARDIAC CATHETERIZATION  5/12,1/13   4 stents placed   COLON SURGERY     CORONARY ARTERY BYPASS GRAFT     KNEE ARTHROSCOPY  10/11/2011   Procedure: ARTHROSCOPY KNEE;  Surgeon: Lamar DELENA Millman, MD;  Location: [facility];  Service: Orthopedics;  Laterality:  Left;  Left Knee Arthroscopy with Medial and Lateral Partial Menisectomy, Chondroplasty   LEFT HEART CATHETERIZATION WITH CORONARY ANGIOGRAM N/A 08/25/2011   Procedure: LEFT HEART CATHETERIZATION WITH CORONARY ANGIOGRAM;  Surgeon: Lonni JONETTA Cash, MD;  Location: [facility] C[facility] LAB;  Service: Cardiovascular;  Laterality: N/A;   LITHOTRIPSY     STERIOD INJECTION  10/11/2011   Procedure: STEROID INJECTION;  Surgeon: Lamar DELENA Millman, MD;  Location: [facility];  Service: Orthopedics;  Laterality: Right;  Steroid Injection Second Toe   TRANSURETHRAL RESECTION OF PROSTATE     URETHRAL DILATION      Family History  Problem Relation Age of Onset   Heart disease Mother    Heart disease Father    Hyperlipidemia Father    Stroke Father    Hyperlipidemia Brother    Heart disease Brother    Coronary artery disease Other        family hx of male 1st degree relative ,74   Hyperlipidemia Other        family hx of   Hypertension Other        family hx of   Arthritis Other        family hx of   Healthy Daughter    Dementia Neg Hx     Social History   Socioeconomic History   Marital status: Divorced    Spouse name: Not on file   Number of children: 1   Years of education: 12   Highest education level: High school graduate  Occupational History   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  Tobacco Use   Smoking status: Never    Passive exposure: Never   Smokeless tobacco: Never  Vaping Use   Vaping status: Never Used  Substance and Sexual Activity   Alcohol use: No   Drug use: No   Sexual activity: Not Currently  Other Topics Concern   Not on file  Social History Narrative   Divorced 2016   Caffeine use: none    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, [employer]   Social Drivers of Health   Financial Resource Strain: Medium Risk (01/26/2024)   Overall Financial Resource Strain (CARDIA)    Difficulty of Paying Living Expenses: Somewhat hard  Food Insecurity: Food  Insecurity Present (02/12/2024)   Hunger Vital Sign    Worried About Running Out of Food in the Last Year: Sometimes true    Ran Out of Food in the Last Year: Sometimes true  Transportation Needs: No Transportation Needs (04/29/2024)   PRAPARE - Administrator, Civil Service (Medical): No    Lack of Transportation (Non-Medical): No  Physical Activity: Insufficiently Active (01/05/2024)   Exercise  Vital Sign    Days of Exercise per Week: 7 days    Minutes of Exercise per Session: 10 min  Stress: No Stress Concern Present (04/29/2024)   [employer] of Occupational Health - Occupational Stress Questionnaire    Feeling of Stress: Only a little  Social Connections: Socially Isolated (01/05/2024)   Social Connection and Isolation Panel    Frequency of Communication with Friends and Family: Once a week    Frequency of Social Gatherings with Friends and Family: Never    Attends Religious Services: 1 to 4 times per year    Active Member of [employer] or Organizations: No    Attends Banker Meetings: Never    Marital Status: Separated  Intimate Partner Violence: Not At Risk (04/29/2024)   Humiliation, Afraid, Rape, and Kick questionnaire    Fear of Current or Ex-Partner: No    Emotionally Abused: No    Physically Abused: No    Sexually Abused: No    Outpatient Medications Prior to Visit  Medication Sig Dispense Refill   acetaminophen  (TYLENOL ) 325 MG tablet Take 162.5 mg by mouth every 6 (six) hours as needed for mild pain or moderate pain.     aspirin  81 MG EC tablet Take 1 tablet (81 mg total) by mouth daily. 90 tablet 3   azelastine  (ASTELIN ) 0.1 % nasal spray Place 2 sprays into both nostrils 2 (two) times daily. Use in each nostril as directed 30 mL 12   Carbidopa -Levodopa  ER (RYTARY ) 61.25-245 MG CPCR TAKE 1 CAPSULE BY MOUTH 4 TIMES A DAY, TAKE THE LAST CAPSULE AT BEDTIME. (PARKINSON'S) 360 capsule 0   citalopram  (CELEXA ) 20 MG tablet TAKE 1 TABLET (20 MG TOTAL) BY  MOUTH DAILY. 30 tablet 1   empagliflozin  (JARDIANCE ) 10 MG TABS tablet Take 10 mg by mouth daily.     esomeprazole  (NEXIUM ) 40 MG capsule Take 1 capsule (40 mg total) by mouth daily. 90 capsule 0   fluticasone  (FLONASE ) 50 MCG/ACT nasal spray Place 2 sprays into both nostrils daily. 16 g 2   furosemide  (LASIX ) 20 MG tablet Take 1 tablet (20 mg total) by mouth as needed for fluid or edema (shortness of breath). 90 tablet 0   gabapentin  (NEURONTIN ) 100 MG capsule TAKE 1 CAPSULE (100 MG TOTAL) BY MOUTH AT BEDTIME. NERVE PAIN 180 capsule 1   hydrocortisone-pramoxine (ANALPRAM HC) 2.5-1 % rectal cream 1 appful rectally 3 times a day; Duration: 7 day(s)     levocetirizine (XYZAL ) 5 MG tablet Take 1 tablet (5 mg total) by mouth every evening. For allergies 90 tablet 1   metoprolol  tartrate (LOPRESSOR ) 25 MG tablet Take 1 tablet (25 mg total) by mouth daily. 90 tablet 0   montelukast  (SINGULAIR ) 10 MG tablet Take 1 tablet (10 mg total) by mouth at bedtime. 90 tablet 1   nitroGLYCERIN  (NITROSTAT ) 0.4 MG SL tablet Place 1 tablet (0.4 mg total) under the tongue every 5 (five) minutes x 3 doses as needed for chest pain. If chest pain is not relieved after 2nd dose - call 911. 25 tablet 0   QVAR  REDIHALER 40 MCG/ACT inhaler INHALE 2 PUFFS INTO THE LUNGS 2 TIMES DAILY. (BREATHING) 10.6 g 2   sacubitril -valsartan  (ENTRESTO ) 49-51 MG TAKE 1 TABLET BY MOUTH 2 TIMES DAILY. 180 tablet 2   pravastatin  (PRAVACHOL ) 80 MG tablet Take 1 tablet (80 mg total) by mouth every evening. 90 tablet 3   No facility-administered medications prior to visit.    Allergies  Allergen Reactions  Tizanidine  Hcl Hives   Fluoxetine  Other (See Comments)    Caused depression and aggression   Rosuvastatin  Other (See Comments)    Whole body aches   Testosterone Other (See Comments)    ABDOMINAL PAIN and cramping   Ropinirole  Hcl Nausea Only   Requip  [Ropinirole ] Nausea Only    ROS See HPI    Objective:    Physical  Exam Constitutional:      General: He is not in acute distress.    Appearance: He is well-developed.  HENT:     Head: Normocephalic and atraumatic.  Cardiovascular:     Rate and Rhythm: Normal rate and regular rhythm.     Heart sounds: No murmur heard. Pulmonary:     Effort: Pulmonary effort is normal. No respiratory distress.     Breath sounds: Normal breath sounds. No wheezing or rales.  Musculoskeletal:     Comments: Left foot is without redness or swelling.   Skin:    General: Skin is warm and dry.  Neurological:     Mental Status: He is alert and oriented to person, place, and time.     Comments: Parkinson's tremor noted  Psychiatric:        Behavior: Behavior normal.        Thought Content: Thought content normal.      BP 133/83 (BP Location: Right Arm, Patient Position: Sitting, Cuff Size: Normal)   Pulse 88   Temp 97.9 F (36.6 C) (Oral)   Resp 14   Ht 5' 7 (1.702 m)   Wt 210 lb (95.3 kg)   SpO2 97%   BMI 32.89 kg/m  Wt Readings from Last 3 Encounters:  06/05/24 210 lb (95.3 kg)  05/23/24 208 lb (94.3 kg)  05/14/24 210 lb 9.6 oz (95.5 kg)       Assessment & Plan:   Left foot pain-  Hx suspicious for gout.  Uric acid >6, will give trial of colchicine.       I am having Alger Kerstein. Hogen maintain his aspirin  EC, acetaminophen , fluticasone , pravastatin , empagliflozin , levocetirizine, montelukast , Entresto , azelastine , nitroGLYCERIN , esomeprazole , metoprolol  tartrate, furosemide , gabapentin , hydrocortisone-pramoxine, citalopram , Qvar  RediHaler, and Rytary .  No orders of the defined types were placed in this encounter.

## 2024-06-06 ENCOUNTER — Ambulatory Visit: Payer: Self-pay | Admitting: Family

## 2024-06-06 DIAGNOSIS — M79672 Pain in left foot: Secondary | ICD-10-CM

## 2024-06-06 LAB — URIC ACID: Uric Acid, Serum: 6.7 mg/dL (ref 4.0–7.8)

## 2024-06-06 MED ORDER — COLCHICINE 0.6 MG PO TABS: ORAL_TABLET | ORAL | 0 refills | Status: AC

## 2024-06-06 NOTE — Assessment & Plan Note (Signed)
 Hx suspicious for gout.  Uric acid >6, will give trial of colchicine.

## 2024-06-06 NOTE — Telephone Encounter (Signed)
 Uric acid is on the higher side, I do think his pain may be gout related.  I recommend that he take colchicine 2 tabs followed by one tab one hour later.  He may have brief diarrhea.  If pain is not improves by tomorrow, he may repeat same tomorrow.

## 2024-06-07 ENCOUNTER — Ambulatory Visit: Admitting: Physical Therapy

## 2024-06-11 ENCOUNTER — Ambulatory Visit: Payer: Self-pay

## 2024-06-11 NOTE — Telephone Encounter (Signed)
 Appt scheduled

## 2024-06-11 NOTE — Telephone Encounter (Signed)
 FYI Only or Action Required?: FYI only for provider: appointment scheduled on 06/12/24.  Patient was last seen in primary care on 06/05/2024 by Daryl Setter, NP.  Called Nurse Triage reporting Leg Pain.  Symptoms began a week ago.  Interventions attempted: Nothing.  Symptoms are: unchanged.  Triage Disposition: See PCP When Office is Open (Within 3 Days)  Patient/caregiver understands and will follow disposition?: Yes   Copied from CRM #[id number]. Topic: Clinical - Red Word Triage >> Jun 11, 2024  1:48 PM Rea ORN wrote: Red Word that prompted transfer to Nurse Triage: Pt was seen for gout 11/6. The pain has moved from his ankle to his upper leg. Reason for Disposition  [1] MODERATE pain (e.g., interferes with normal activities, limping) AND [2] present > 3 days  Answer Assessment - Initial Assessment Questions No available appts with pcp. Scheduled appt with alternative provider.  Advised call back or UC/ED if symptoms worsen.  1. ONSET: When did the pain start?     Last Thursday 2. LOCATION: Where is the pain located?     Left leg; above ankle to under knee;  Denies redness, swelling No longer hurting in ankle 3. PAIN: How bad is the pain?    (Scale 1-10; or mild, moderate, severe)     6/10 5. CAUSE: What do you think is causing the leg pain?     Possibly from tripping and hitting curb prior to last office visit 6. OTHER SYMPTOMS: Do you have any other symptoms? (e.g., chest pain, back pain, breathing difficulty, swelling, rash, fever, numbness, weakness)     Denies numbness, weakness, chest pain, breathing diff, swelling rash, fever, calf muscle pain, discolored  Protocols used: Leg Pain-A-AH

## 2024-06-12 ENCOUNTER — Ambulatory Visit: Admitting: Nurse Practitioner

## 2024-06-16 NOTE — Progress Notes (Signed)
 Acute Office Visit  Subjective:  Patient ID: Shawn Meza, male    DOB: 05-14-1946  Age: 78 y.o. MRN: [id number]  CC: No chief complaint on file.     HPI Shawn Meza is here for Leg Pain.      Past Medical History:  Diagnosis Date   Abscess of right axilla 12/12/2017   Acute blood loss anemia 10/25/2017   Acute postoperative respiratory insufficiency 10/25/2017   Aortic regurgitation 09/16/2018   Arthritis    BMI 33.0-33.9,adult 12/12/2017   Cardiomyopathy, unspecified (HCC) 06/13/2018   Chest pain in adult 10/11/2017   Chronic coronary artery disease    Chronic systolic (congestive) heart failure (HCC) 09/18/2018   Colon cancer (HCC)    Coronary artery disease involving native coronary artery of native heart with angina pectoris 12/28/2010   Added automatically from request for surgery [id number]     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        GERD (gastroesophageal reflux disease)    Hiatal hernia    History of cardiovascular disorder 05/20/2010   Qualifier: Diagnosis of   By: Joshua MD, Ned CROME.     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        Ischemic cardiomyopathy 09/16/2018   LBBB (left bundle branch block)    Long term current use of aspirin  03/29/2017   Long-term use of aspirin  therapy    Low back pain 10/28/2019   Lumbar spondylosis 06/29/2022   Major depression, chronic    Major depressive disorder, recurrent episode, moderate (HCC) 06/30/2022   Medial meniscus tear 10/11/2011   Memory change  07/06/2015   Metabolic syndrome    Mixed hyperlipidemia 05/20/2010   Qualifier: Diagnosis of   By: Joshua MD, Ned CROME.        Myofascial pain 12/20/2019   Nephrolithiasis    hx of   NEPHROLITHIASIS, HX OF 05/20/2010   Qualifier: Diagnosis of   By: Joshua MD, Ned CROME.        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   SOB (shortness of breath) 03/31/2017   Transient ischemic attack    hx of   Trochanteric bursitis of right hip     Past Surgical History:  Procedure Laterality Date   CARDIAC CATHETERIZATION  5/12,1/13   4 stents placed   COLON SURGERY     CORONARY ARTERY BYPASS GRAFT     KNEE ARTHROSCOPY  10/11/2011   Procedure: ARTHROSCOPY KNEE;  Surgeon: Lamar DELENA Millman, MD;  Location: [facility];  Service: Orthopedics;  Laterality: Left;  Left Knee Arthroscopy with Medial and Lateral Partial Menisectomy, Chondroplasty   LEFT HEART CATHETERIZATION WITH CORONARY ANGIOGRAM N/A 08/25/2011   Procedure: LEFT  HEART CATHETERIZATION WITH CORONARY ANGIOGRAM;  Surgeon: Lonni JONETTA Cash, MD;  Location: [facility] C[facility] LAB;  Service: Cardiovascular;  Laterality: N/A;   LITHOTRIPSY     STERIOD INJECTION  10/11/2011   Procedure: STEROID INJECTION;  Surgeon: Lamar DELENA Millman, MD;  Location: [facility];  Service: Orthopedics;  Laterality: Right;  Steroid Injection Second Toe   TRANSURETHRAL RESECTION OF PROSTATE     URETHRAL DILATION      Family History  Problem Relation Age of Onset   Heart disease Mother    Heart disease Father    Hyperlipidemia Father    Stroke Father    Hyperlipidemia Brother    Heart disease Brother    Coronary artery disease Other        family hx of male 1st degree relative ,68    Hyperlipidemia Other        family hx of   Hypertension Other        family hx of   Arthritis Other        family hx of   Healthy Daughter    Dementia Neg Hx     Social History   Socioeconomic History   Marital status: Divorced    Spouse name: Not on file   Number of children: 1   Years of education: 12   Highest education level: High school graduate  Occupational History   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  Tobacco Use   Smoking status: Never    Passive exposure: Never   Smokeless tobacco: Never  Vaping Use   Vaping status: Never Used  Substance and Sexual Activity   Alcohol use: No   Drug use: No   Sexual activity: Not Currently  Other Topics Concern   Not on file  Social History Narrative   Divorced 2016   Caffeine use: none    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, [employer]   Social Drivers of Health   Financial Resource Strain: Medium Risk (01/26/2024)   Overall Financial Resource Strain (CARDIA)    Difficulty of Paying Living Expenses: Somewhat hard  Food Insecurity: Food Insecurity Present (02/12/2024)   Hunger Vital Sign    Worried About Running Out of Food in the Last Year: Sometimes true    Ran Out of Food in the Last Year: Sometimes true  Transportation Needs: No Transportation Needs (04/29/2024)   PRAPARE - Administrator, Civil Service (Medical): No    Lack of Transportation (Non-Medical): No  Physical Activity: Insufficiently Active (01/05/2024)   Exercise Vital Sign    Days of Exercise per Week: 7 days    Minutes of Exercise per Session: 10 min  Stress: No Stress Concern Present (04/29/2024)   [employer] of Occupational Health - Occupational Stress Questionnaire    Feeling of Stress: Only a little  Social Connections: Socially Isolated (01/05/2024)   Social Connection and Isolation Panel    Frequency of Communication with Friends and Family: Once a week    Frequency of Social Gatherings with Friends and Family: Never     Attends Religious Services: 1 to 4 times per year    Active Member of [employer] or Organizations: No    Attends Banker Meetings: Never    Marital Status: Separated  Intimate Partner Violence: Not At Risk (04/29/2024)   Humiliation, Afraid, Rape, and Kick questionnaire    Fear of Current or Ex-Partner: No    Emotionally Abused: No    Physically Abused: No  Sexually Abused: No    ROS All ROS negative except what is listed in the HPI.   Objective:   Today's Vitals: There were no vitals taken for this visit.  Physical Exam  Assessment & Plan:   Problem List Items Addressed This Visit   None     Follow-up: No follow-ups on file.   Waddell FURY Almarie, DNP, FNP-C  I,Emily Lagle,acting as a neurosurgeon for Waddell KATHEE Almarie, NP.,have documented all relevant documentation on the behalf of Waddell KATHEE Almarie, NP.   I, Waddell KATHEE Almarie, NP, have reviewed all documentation for this visit. The documentation on 06/17/2024 for the exam, diagnosis, procedures, and orders are all accurate and complete.

## 2024-06-17 ENCOUNTER — Encounter: Payer: Self-pay | Admitting: Family Medicine

## 2024-06-17 ENCOUNTER — Ambulatory Visit (HOSPITAL_BASED_OUTPATIENT_CLINIC_OR_DEPARTMENT_OTHER)
Admission: RE | Admit: 2024-06-17 | Discharge: 2024-06-17 | Disposition: A | Discharge status: Home / Self Care | Source: Ambulatory Visit | Attending: Family Medicine | Admitting: Family Medicine

## 2024-06-17 ENCOUNTER — Other Ambulatory Visit: Admitting: Pharmacist

## 2024-06-17 ENCOUNTER — Other Ambulatory Visit: Payer: Self-pay | Admitting: Family Medicine

## 2024-06-17 ENCOUNTER — Ambulatory Visit: Admitting: Family Medicine

## 2024-06-17 ENCOUNTER — Ambulatory Visit: Payer: Self-pay | Admitting: Family Medicine

## 2024-06-17 VITALS — BP 144/74 | HR 90 | Ht 67.0 in | Wt 207.0 lb

## 2024-06-17 DIAGNOSIS — M79605 Pain in left leg: Secondary | ICD-10-CM | POA: Insufficient documentation

## 2024-06-17 DIAGNOSIS — F411 Generalized anxiety disorder: Secondary | ICD-10-CM

## 2024-06-17 DIAGNOSIS — J3489 Other specified disorders of nose and nasal sinuses: Secondary | ICD-10-CM

## 2024-06-17 DIAGNOSIS — J309 Allergic rhinitis, unspecified: Secondary | ICD-10-CM

## 2024-06-17 NOTE — Progress Notes (Signed)
 Pharmacy Note  06/17/2024 Name: Shawn Meza MRN: [id number] DOB: 1945-09-27  Subjective: Shawn Meza is a 78 y.o. year old male who is a primary care patient of Frann Mabel Mt, DO. Clinical Pharmacist Practitioner referral was placed to assist with medication management.    Engaged with patient by telephone for follow up visit today.   Upcoming appointments:  Neurologist - Dr [name] - 11/21/2024 PCP / Frann - not currently scheduled - Last office visit was 04/30/2024  Medication Management:  Patient has been noted in the past to take medications inconsistently. He reports he is taking medications as prescribed today. He does miss doses from time to time - mostly evening doses. About once per week. He tried adherence pack in the past but did not like them. He also prefers to use is local pharmacy - [facility] and they do not currently have a packaging system  Patient is using weekly pill container to help with remembering to take medication.  He also asks for pharmacy to print what medication is for on the label and uses the chart that I prepared for him.   Depression / GAD:  Current therapy: citalopram  20mg  daily.  Patient reports fewer mood swings and anger episodes. He is taking at night because he feels the citalopram  makes him sleepy.  He is seeing counselor regularly.   CHF (HFrEF):  ECHO 02/24/2023 - EF improved but still low at 30%. Blood pressure has been at goal at last 3 office visits.   BP Readings from Last 3 Encounters:  06/05/24 133/83  05/23/24 118/62  05/14/24 114/70    Current therapy:  Jardiance  10mg  daily, furosemide  20mg  daily if needed; metoprolol  tartrate 25mg  daily and Entresto  - 49/51mg   twice a day,   Patient denies increase in weight. States that he felt fatigued when he walked up to office today. He will see provider today for gout pain.  He is checking weight most days but has not been recording. Patient reports weight has been  stable.  Reports occsional edema in lower extremities that is relieved with as needed furosemide .  Parkinson's Disease:  Current therapy - Rytary  4 times a day (dose increased 10/18/23 after his last appointment with Dr [name]). He should be due a refill but patient states he does not need a refill.  Reviewed meds and he asked for refill for all his other maintenance medications.    Objective: Review of patient status, including review of consultants reports, laboratory and other test data, was performed as part of comprehensive.  Lab Results  Component Value Date   CREATININE 0.94 04/18/2024   CREATININE 0.98 02/26/2024   CREATININE 0.90 01/12/2024    Lab Results  Component Value Date   HGBA1C 5.8 11/28/2022       Component Value Date/Time   CHOL 198 11/29/2023 1621   CHOL 163 03/31/2022 1708   TRIG 111.0 11/29/2023 1621   TRIG 107 05/09/2010 0000   HDL 48.90 11/29/2023 1621   HDL 36 (L) 03/31/2022 1708   CHOLHDL 4 11/29/2023 1621   VLDL 22.2 11/29/2023 1621   LDLCALC 127 (H) 11/29/2023 1621   LDLCALC 91 03/31/2022 1708   LDLDIRECT 112 (H) 09/28/2023 1209     Clinical ASCVD: Yes  The ASCVD Risk score (Arnett DK, et al., 2019) failed to calculate for the following reasons:   The 2019 ASCVD risk score is only valid for ages 30 to 13   Risk score cannot be calculated because patient  has a medical history suggesting prior/existing ASCVD    BP Readings from Last 3 Encounters:  06/05/24 133/83  05/23/24 118/62  05/14/24 114/70     Allergies  Allergen Reactions   Tizanidine  Hcl Hives   Fluoxetine  Other (See Comments)    Caused depression and aggression   Rosuvastatin  Other (See Comments)    Whole body aches   Testosterone Other (See Comments)    ABDOMINAL PAIN and cramping   Ropinirole  Hcl Nausea Only   Requip  [Ropinirole ] Nausea Only    Medications Reviewed Today     Reviewed by Carla Milling, RPH-CPP (Pharmacist) on 06/17/24 at 1457  Med List Status: <None>    Medication Order Taking? Sig Documenting Provider Last Dose Status Informant  acetaminophen  (TYLENOL ) 325 MG tablet [id number]  Take 162.5 mg by mouth every 6 (six) hours as needed for mild pain or moderate pain. [provider]  Active   aspirin  81 MG EC tablet [id number]  Take 1 tablet (81 mg total) by mouth daily. Frann Mabel Mt, DO  Active   azelastine  (ASTELIN ) 0.1 % nasal spray [id number]  Place 2 sprays into both nostrils 2 (two) times daily. Use in each nostril as directed Frann Mabel Mt, DO  Active   Carbidopa -Levodopa  ER (RYTARY ) 61.25-245 MG CPCR [id number] Yes TAKE 1 CAPSULE BY MOUTH 4 TIMES A DAY, TAKE THE LAST CAPSULE AT BEDTIME. (PARKINSON'S) Leigh Venetia CROME, MD  Active   citalopram  (CELEXA ) 20 MG tablet [id number] Yes TAKE 1 TABLET (20 MG TOTAL) BY MOUTH DAILY. Frann Mabel Mt, DO  Active   colchicine 0.6 MG tablet [id number]  Take 2 tabs (1.2 mg) by mouth once, then 1 tab (0.6 mg) one hour later O'Sullivan, Melissa, NP  Active   empagliflozin  (JARDIANCE ) 10 MG TABS tablet [id number] Yes Take 10 mg by mouth daily. [provider]  Active   esomeprazole  (NEXIUM ) 40 MG capsule [id number] Yes Take 1 capsule (40 mg total) by mouth daily. Frann Mabel Mt, DO  Active   fluticasone  (FLONASE ) 50 MCG/ACT nasal spray [id number] Yes Place 2 sprays into both nostrils daily. Frann Mabel Mt, DO  Active   furosemide  (LASIX ) 20 MG tablet [id number] Yes Take 1 tablet (20 mg total) by mouth as needed for fluid or edema (shortness of breath). Frann Mabel Mt, DO  Active   gabapentin  (NEURONTIN ) 100 MG capsule [id number] Yes TAKE 1 CAPSULE (100 MG TOTAL) BY MOUTH AT BEDTIME. NERVE PAIN Frann Mabel Mt, DO  Active   hydrocortisone-pramoxine [facility]) 2.5-1 % rectal cream [id number]  1 appful rectally 3 times a day; Duration: 7 day(s) [provider]  Active   levocetirizine (XYZAL ) 5 MG tablet [id number] Yes Take 1 tablet (5 mg  total) by mouth every evening. For allergies Frann Mabel Mt, DO  Active   metoprolol  tartrate (LOPRESSOR ) 25 MG tablet [id number] Yes Take 1 tablet (25 mg total) by mouth daily. Frann Mabel Mt, DO  Active   montelukast  (SINGULAIR ) 10 MG tablet [id number]  Take 1 tablet (10 mg total) by mouth at bedtime. Frann Mabel Mt, DO  Active   nitroGLYCERIN  (NITROSTAT ) 0.4 MG SL tablet [id number]  Place 1 tablet (0.4 mg total) under the tongue every 5 (five) minutes x 3 doses as needed for chest pain. If chest pain is not relieved after 2nd dose - call 911. Frann Mabel Mt, DO  Active   pravastatin  (PRAVACHOL ) 80 MG tablet [id number] Yes Take 1 tablet (80 mg total) by mouth every evening. Carlin Delon BROCKS, NP  Active   QVAR  REDIHALER 40 MCG/ACT inhaler [id number] Yes INHALE 2 PUFFS INTO THE LUNGS 2 TIMES DAILY. (BREATHING) Frann Mabel Mt, DO  Active   sacubitril -valsartan  (ENTRESTO ) 49-51 MG [id number] Yes TAKE 1 TABLET BY MOUTH 2 TIMES DAILY. Bernie Lamar PARAS, MD  Active             Patient Active Problem List   Diagnosis Date Noted   Left foot pain 06/06/2024   LBBB (left bundle branch block)    Major depressive disorder, recurrent episode, moderate (HCC) 06/30/2022   Lumbar spondylosis 06/29/2022   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   Diarrhea 11/04/2020   Rectal bleeding 11/04/2020   Trochanteric bursitis of right hip    Transient ischemic attack    NSTEMI (non-ST elevated myocardial infarction) (HCC)    Nephrolithiasis    Metabolic syndrome    Major depression, chronic    Long-term use of aspirin  therapy    Hypercholesterolemia    Hiatal hernia    GERD (gastroesophageal reflux disease)    Essential tremor    Essential hypertension    Erectile dysfunction    Elevated PSA    Colon cancer (HCC)    Chronic coronary artery disease    Arthritis    Myofascial pain 12/20/2019   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   Low back pain 10/28/2019    Radiculopathy, lumbar region 10/28/2019   Fatigue 01/15/2019   Chronic systolic (congestive) heart failure (HCC) 09/18/2018   Ischemic cardiomyopathy 09/16/2018   Aortic regurgitation 09/16/2018   Cardiomyopathy, unspecified (HCC) 06/13/2018   Abscess of right axilla 12/12/2017   BMI 33.0-33.9,adult 12/12/2017   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   Acute blood loss anemia 10/25/2017   Acute postoperative respiratory insufficiency 10/25/2017   Postoperative delirium 10/25/2017   Dyslipidemia 10/17/2017   Chest pain in adult 10/11/2017   SOB (shortness of breath) 03/31/2017   Long term current use of aspirin  03/29/2017   Parkinson's disease (HCC) 01/02/2017   Memory change 07/06/2015   Depression, recurrent 07/06/2015   Medial meniscus tear 10/11/2011   Neuroma of foot 10/11/2011   Coronary artery disease involving native coronary artery of native heart with angina pectoris 12/28/2010   OTHER TESTICULAR HYPOFUNCTION 05/20/2010   Mixed hyperlipidemia 05/20/2010   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/20/2010   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 05/20/2010   GERD 05/20/2010   History of cardiovascular disorder 05/20/2010   NEPHROLITHIASIS, HX OF 05/20/2010   BENIGN PROSTATIC HYPERTROPHY, HX OF, S/P TURP 05/20/2010     Medication Assistance:  None required.  Patient affirms current coverage meets needs. (Has [facility] and Medicaid coverage)   Medication List for Shawn Meza (DOB: [birth year]) Updated 02/21/2024 Medication Reason for taking 30 minutes before breakfast Morning / with breakfast Lunch Dinner Bedtime  Esomeprazole /Nexium  40mg  capsule Acid reflux 1 capsule      Rytary  61.5/245 mg Parkinson's  1 capsule 1 capsule 1 capsule 1 capsule  Entresto   Heart  1 tablet  1 tablet   Jardiance  10mg  Heart/ kidney protection  1 tablet     Metoprolol  25mg   Heart / blood pressure  1 tablet     Pravastatin  80mg   Heart /  cholesterol        Citalopram  20mg  Mood     1 tablet  Gabapentin  100mg  Nerve / leg pain at night     1 capsule  Montelukast  10mg  Allergies / asthma     1 tablet  Levocetirizine  5mg  Allergies     1 tablet  Pravastatin  80mg   Heart / cholesterol     1 tablet           Fluticasone  nasal spray (Flonase )  Allergic rhinitis / runny nose  2 sprays in each nostril     Azelastine  0.1% nasal spray (Astelin ) Runny nose  2 sprays in each nostril   2 sprays in each nostril  Qvar  inhaler Breathing / asthma  2 inhalations into lungs   2 inhalations into lungs            Medications to take if needed   Tylenol  325mg       Take 0.5 to 1 tablet at bedtime as needed for sleep  Nitroglycerin  tablets  If having chest pain, place 1 tablet under your tongue and allow to dissolve, if chest pain continues can report for 2 more doses but call 911 immediate if chest pain does not improve with 2nd dose.    Furosemide  20mg  Heart / swelling Check weight every day and record in a notebook. Take furosemide  if you notice an increase of 3 or more pounds in 24 hours or 5 pounds in a week OR if you have shortness of breath, swelling in your legs or ankles      Assessment / Plan: CHF / hypertension:  Last 3 office BPs have been well controlled.  Continue current medication therapy - Jardiance  10mg , Entresto , metoprolol  25mg  once a day and furosemide  20mg  daily as needed. Refill updated at his pharmacy.   Parkinson's: Continue Rytary  ER per Dr Collie directions.   Depression:  Continue citalopram  to 20mg  daily. Coordinated refill of citalopram  for patient from his pharmacy.  Continue to see counselor and psychiatrist.   Medication management  Reviewed and updated medication list Reviewed refill history and adherence.  Encouraged patient to continue to use weekly pill container to help with medication adherance.  He feels his current medication container is helping. Requested needed meds from [facility] at patient  request - Flonase  nasal spray, pravastatin , metoprolol , Jardiance , Entresto , montelukast , metoprolol , levocetirizine, citalopram  and QVAR  inhaler.    Follow Up:  4 to 6 weeks;    Madelin Ray, PharmD Clinical Pharmacist [facility]  - [facility] [phone]

## 2024-06-18 ENCOUNTER — Telehealth: Payer: Self-pay

## 2024-06-18 NOTE — Telephone Encounter (Signed)
 Call pt and appt schedule for Med check.

## 2024-07-02 ENCOUNTER — Ambulatory Visit: Admitting: Physician Assistant

## 2024-07-03 ENCOUNTER — Ambulatory Visit: Admitting: Psychology

## 2024-07-03 DIAGNOSIS — F331 Major depressive disorder, recurrent, moderate: Secondary | ICD-10-CM
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
## 2024-07-03 NOTE — Progress Notes (Signed)
   Shawn Ridgel, PhD

## 2024-07-15 ENCOUNTER — Ambulatory Visit: Admitting: Family Medicine

## 2024-07-17 ENCOUNTER — Ambulatory Visit: Admitting: Psychology

## 2024-07-17 DIAGNOSIS — F331 Major depressive disorder, recurrent, moderate: Secondary | ICD-10-CM | POA: Diagnosis not present
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
## 2024-07-22 ENCOUNTER — Ambulatory Visit: Admitting: Family Medicine

## 2024-07-22 ENCOUNTER — Telehealth: Payer: Self-pay | Admitting: Family Medicine

## 2024-07-22 ENCOUNTER — Other Ambulatory Visit

## 2024-07-22 NOTE — Telephone Encounter (Signed)
 Copied from CRM [id number]. Topic: Appointments - Appointment Cancel/Reschedule >> Jul 22, 2024 12:19 PM Rea C wrote: Patient/patient representative is calling to cancel his appointment today with a pharmacist.    [phone] BENNIE)   03:15 PM - PATIENT OUTREACH [address] [place] [facility] - [facility] PHARMACIST

## 2024-07-23 ENCOUNTER — Ambulatory Visit: Admitting: Family Medicine

## 2024-08-14 ENCOUNTER — Ambulatory Visit: Admitting: Psychology

## 2024-08-15 ENCOUNTER — Encounter: Payer: Self-pay | Admitting: Cardiology

## 2024-08-15 ENCOUNTER — Ambulatory Visit: Attending: Cardiology | Admitting: Cardiology

## 2024-08-15 VITALS — BP 120/78 | HR 84 | Ht 67.0 in | Wt 207.0 lb

## 2024-08-15 DIAGNOSIS — I25119 Atherosclerotic heart disease of native coronary artery with unspecified angina pectoris: Secondary | ICD-10-CM

## 2024-08-15 DIAGNOSIS — E782 Mixed hyperlipidemia: Secondary | ICD-10-CM | POA: Diagnosis not present

## 2024-08-15 DIAGNOSIS — I10 Essential (primary) hypertension: Secondary | ICD-10-CM

## 2024-08-15 DIAGNOSIS — R0609 Other forms of dyspnea: Secondary | ICD-10-CM

## 2024-08-15 DIAGNOSIS — I255 Ischemic cardiomyopathy: Secondary | ICD-10-CM

## 2024-08-15 MED ORDER — EZETIMIBE 10 MG PO TABS: 10.0000 mg | ORAL_TABLET | Freq: Every day | ORAL | 3 refills | Status: AC

## 2024-08-15 NOTE — Patient Instructions (Signed)
 Medication Instructions:   START: Zetia  10mg  1 tablet daily  *If you need a refill on your cardiac medications before your next appointment, please call your pharmacy*  Lab Work: Your physician recommends that you return for lab work in: 6 weeks You need to have labs done when you are fasting.  You can come Monday through Friday 8:30 am to 12:00 pm and 1:15 to 4:30. You do not need to make an appointment as the order has already been placed.    If you have labs (blood work) drawn today and your tests are completely normal, you will receive your results only by: MyChart Message (if you have MyChart) OR A paper copy in the mail If you have any lab test that is abnormal or we need to change your treatment, we will call you to review the results.  Testing/Procedures: Your physician has requested that you have an echocardiogram. Echocardiography is a painless test that uses sound waves to create images of your heart. It provides your doctor with information about the size and shape of your heart and how well your hearts chambers and valves are working. This procedure takes approximately one hour. There are no restrictions for this procedure. Please do NOT wear cologne, perfume, aftershave, or lotions (deodorant is allowed). Please arrive 15 minutes prior to your appointment time.  Please note: We ask at that you not bring children with you during ultrasound (echo/ vascular) testing. Due to room size and safety concerns, children are not allowed in the ultrasound rooms during exams. Our front office staff cannot provide observation of children in our lobby area while testing is being conducted. An adult accompanying a patient to their appointment will only be allowed in the ultrasound room at the discretion of the ultrasound technician under special circumstances. We apologize for any inconvenience.   Follow-Up: At [facility], you and your health needs are our priority.  As part of our  continuing mission to provide you with exceptional heart care, our providers are all part of one team.  This team includes your primary Cardiologist (physician) and Advanced Practice Providers or APPs (Physician Assistants and Nurse Practitioners) who all work together to provide you with the care you need, when you need it.  Your next appointment:   6 month(s)  Provider:   Lamar Fitch, MD    We recommend signing up for the patient portal called MyChart.  Sign up information is provided on this After Visit Summary.  MyChart is used to connect with patients for Virtual Visits (Telemedicine).  Patients are able to view lab/test results, encounter notes, upcoming appointments, etc.  Non-urgent messages can be sent to your provider as well.   To learn more about what you can do with MyChart, go to [email or web address].   Other Instructions

## 2024-08-15 NOTE — Progress Notes (Signed)
 " Cardiology Office Note:    Date:  08/15/2024   ID:  Shawn Meza, DOB [birth year], MRN [id number]  PCP:  Frann Mabel Mt, DO  Cardiologist:  Lamar Fitch, MD    Referring MD: Frann Mabel Mt, DO   Chief Complaint  Patient presents with   Follow-up  Doing fine  History of Present Illness:    Shawn Meza is a 79 y.o. male past medical history significant for coronary disease.  In 2019 March he did have coronary bypass graft, he does have cardiomyopathy ejection fraction 25 to 30%, Parkinson's which is slowly getting worse.  Comes today to months for follow-up.  He said Parkinson's really getting bad he used to exercise on the regular basis now he cannot do that.  Denies of any cardiac complaints no chest pain tightness squeezing pressure burning chest but assessment is somewhat difficult because of lack of ability to exercise.  Past Medical History:  Diagnosis Date   Abscess of right axilla 12/12/2017   Acute blood loss anemia 10/25/2017   Acute postoperative respiratory insufficiency 10/25/2017   Aortic regurgitation 09/16/2018   Arthritis    BMI 33.0-33.9,adult 12/12/2017   Cardiomyopathy, unspecified (HCC) 06/13/2018   Chest pain in adult 10/11/2017   Chronic coronary artery disease    Chronic systolic (congestive) heart failure (HCC) 09/18/2018   Colon cancer (HCC)    Coronary artery disease involving native coronary artery of native heart with angina pectoris 12/28/2010   Added automatically from request for surgery [id number]     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        GERD (gastroesophageal reflux disease)    Hiatal  hernia    History of cardiovascular disorder 05/20/2010   Qualifier: Diagnosis of   By: Joshua MD, Ned CROME.     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        Ischemic cardiomyopathy 09/16/2018   LBBB (left bundle branch block)    Long term current use of aspirin  03/29/2017   Long-term use of aspirin  therapy    Low back pain 10/28/2019   Lumbar spondylosis 06/29/2022   Major depression, chronic    Major depressive disorder, recurrent episode, moderate (HCC) 06/30/2022   Medial meniscus tear 10/11/2011   Memory change 07/06/2015   Metabolic syndrome    Mixed hyperlipidemia 05/20/2010   Qualifier: Diagnosis of   By: Joshua MD, Ned CROME.        Myofascial pain 12/20/2019   Nephrolithiasis    hx of   NEPHROLITHIASIS, HX OF 05/20/2010   Qualifier: Diagnosis of   By: Joshua MD, Ned CROME.        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   SOB (shortness  of breath) 03/31/2017   Transient ischemic attack    hx of   Trochanteric bursitis of right hip     Past Surgical History:  Procedure Laterality Date   CARDIAC CATHETERIZATION  5/12,1/13   4 stents placed   COLON SURGERY     CORONARY ARTERY BYPASS GRAFT     KNEE ARTHROSCOPY  10/11/2011   Procedure: ARTHROSCOPY KNEE;  Surgeon: Lamar DELENA Millman, MD;  Location: [facility];  Service: Orthopedics;  Laterality: Left;  Left Knee Arthroscopy with Medial and Lateral Partial Menisectomy, Chondroplasty   LEFT HEART CATHETERIZATION WITH CORONARY ANGIOGRAM N/A 08/25/2011   Procedure: LEFT HEART CATHETERIZATION WITH CORONARY ANGIOGRAM;  Surgeon:  Lonni JONETTA Cash, MD;  Location: [facility] CATH LAB;  Service: Cardiovascular;  Laterality: N/A;   LITHOTRIPSY     STERIOD INJECTION  10/11/2011   Procedure: STEROID INJECTION;  Surgeon: Lamar DELENA Millman, MD;  Location: [facility];  Service: Orthopedics;  Laterality: Right;  Steroid Injection Second Toe   TRANSURETHRAL RESECTION OF PROSTATE     URETHRAL DILATION      Current Medications: Active Medications[1]   Allergies:   Tizanidine  hcl, Fluoxetine , Rosuvastatin , Testosterone, Ropinirole  hcl, and Requip  [ropinirole ]   Social History   Socioeconomic History   Marital status: Divorced    Spouse name: Not on file   Number of children: 1   Years of education: 12   Highest education level: High school graduate  Occupational History   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  Tobacco Use   Smoking status: Never    Passive exposure: Never   Smokeless tobacco: Never  Vaping Use   Vaping status: Never Used  Substance and Sexual Activity   Alcohol use: No   Drug use: No   Sexual activity: Not Currently  Other Topics Concern   Not on file  Social History Narrative   Divorced 2016   Caffeine use: none    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, [employer]   Social Drivers of Health   Tobacco Use: Low Risk (08/15/2024)   Patient History    Smoking Tobacco Use: Never    Smokeless Tobacco Use: Never    Passive Exposure: Never  Financial Resource Strain: Medium Risk (01/26/2024)   Overall Financial Resource Strain (CARDIA)    Difficulty of Paying Living Expenses: Somewhat hard  Food Insecurity: Food Insecurity Present (02/12/2024)   Epic    Worried About Programme Researcher, Broadcasting/film/video in the Last Year: Sometimes true    Ran Out of Food in the Last Year: Sometimes true  Transportation Needs: No Transportation Needs (04/29/2024)   Epic    Lack of Transportation (Medical): No    Lack of Transportation (Non-Medical): No  Physical Activity: Insufficiently Active (01/05/2024)    Exercise Vital Sign    Days of Exercise per Week: 7 days    Minutes of Exercise per Session: 10 min  Stress: No Stress Concern Present (04/29/2024)   [employer] of Occupational Health - Occupational Stress Questionnaire    Feeling of Stress: Only a little  Social Connections: Socially Isolated (01/05/2024)   Social Connection and Isolation Panel    Frequency of Communication with Friends and Family: Once a week    Frequency of Social Gatherings with Friends and Family: Never    Attends Religious Services: 1 to 4 times per year    Active Member of [employer] or Organizations: No    Attends Banker Meetings: Never    Marital Status:  Separated  Depression (PHQ2-9): Low Risk (04/30/2024)   Depression (PHQ2-9)    PHQ-2 Score: 0  Recent Concern: Depression (PHQ2-9) - Medium Risk (03/22/2024)   Depression (PHQ2-9)    PHQ-2 Score: 6  Alcohol Screen: Low Risk (01/05/2024)   Alcohol Screen    Last Alcohol Screening Score (AUDIT): 0  Housing: Low Risk (04/29/2024)   Epic    Unable to Pay for Housing in the Last Year: No    Number of Times Moved in the Last Year: 0    Homeless in the Last Year: No  Utilities: Not At Risk (04/29/2024)   Epic    Threatened with loss of utilities: No  Health Literacy: Inadequate Health Literacy (04/29/2024)   B1300 Health Literacy    Frequency of need for help with medical instructions: Often     Family History: The patient's family history includes Arthritis in an other family member; Coronary artery disease in an other family member; Healthy in his daughter; Heart disease in his brother, father, and mother; Hyperlipidemia in his brother, father, and another family member; Hypertension in an other family member; Stroke in his father. There is no history of Dementia. ROS:   Please see the history of present illness.    All 14 point review of systems negative except as described per history of present illness  EKGs/Labs/Other Studies Reviewed:          Recent Labs: 01/12/2024: ALT 12; TSH 4.19 04/18/2024: BUN 16; Creatinine, Ser 0.94; Hemoglobin 15.3; Platelets 172; Potassium 4.5; Sodium 142  Recent Lipid Panel    Component Value Date/Time   CHOL 198 11/29/2023 1621   CHOL 163 03/31/2022 1708   TRIG 111.0 11/29/2023 1621   TRIG 107 05/09/2010 0000   HDL 48.90 11/29/2023 1621   HDL 36 (L) 03/31/2022 1708   CHOLHDL 4 11/29/2023 1621   VLDL 22.2 11/29/2023 1621   LDLCALC 127 (H) 11/29/2023 1621   LDLCALC 91 03/31/2022 1708   LDLDIRECT 112 (H) 09/28/2023 1209    Physical Exam:    VS:  BP 120/78   Pulse 84   Ht 5' 7 (1.702 m)   Wt 207 lb (93.9 kg)   SpO2 97%   BMI 32.42 kg/m     Wt Readings from Last 3 Encounters:  08/15/24 207 lb (93.9 kg)  06/17/24 207 lb (93.9 kg)  06/05/24 210 lb (95.3 kg)     GEN:  Well nourished, well developed in no acute distress HEENT: Normal NECK: No JVD; No carotid bruits LYMPHATICS: No lymphadenopathy CARDIAC: RRR, no murmurs, no rubs, no gallops RESPIRATORY:  Clear to auscultation without rales, wheezing or rhonchi  ABDOMEN: Soft, non-tender, non-distended MUSCULOSKELETAL:  No edema; No deformity  SKIN: Warm and dry LOWER EXTREMITIES: no swelling NEUROLOGIC:  Alert and oriented x 3 PSYCHIATRIC:  Normal affect   ASSESSMENT:    1. Ischemic cardiomyopathy   2. Coronary artery disease involving native coronary artery of native heart with angina pectoris   3. Essential hypertension    PLAN:    In order of problems listed above:  Coronary disease seems to be stable from that point review on guideline directed medical therapy which I will continue. Ischemic cardiomyopathy will repeat echocardiogram we had again discussion about potentially having ICD done.  He does not want to do it. Essential hypertension blood pressure well-controlled. Dyslipidemia I did review KPN which show me his LDL of 127 HDL 48.  He is taking pravastatin  80.  Will add Zetia  will follow-up with  fasting lipid  profile.   Medication Adjustments/Labs and Tests Ordered: Current medicines are reviewed at length with the patient today.  Concerns regarding medicines are outlined above.  No orders of the defined types were placed in this encounter.  Medication changes: No orders of the defined types were placed in this encounter.   Signed, Lamar DOROTHA Fitch, MD, [facility] 08/15/2024 1:41 PM    [facility]    [1]  Current Meds  Medication Sig   acetaminophen  (TYLENOL ) 325 MG tablet Take 162.5 mg by mouth every 6 (six) hours as needed for mild pain or moderate pain.   aspirin  81 MG EC tablet Take 1 tablet (81 mg total) by mouth daily.   azelastine  (ASTELIN ) 0.1 % nasal spray Place 2 sprays into both nostrils 2 (two) times daily. Use in each nostril as directed   Carbidopa -Levodopa  ER (RYTARY ) 61.25-245 MG CPCR TAKE 1 CAPSULE BY MOUTH 4 TIMES A DAY, TAKE THE LAST CAPSULE AT BEDTIME. (PARKINSON'S)   citalopram  (CELEXA ) 20 MG tablet TAKE 1 TABLET (20 MG TOTAL) BY MOUTH DAILY. (MOOD)   colchicine  0.6 MG tablet Take 2 tabs (1.2 mg) by mouth once, then 1 tab (0.6 mg) one hour later   empagliflozin  (JARDIANCE ) 10 MG TABS tablet Take 10 mg by mouth daily.   esomeprazole  (NEXIUM ) 40 MG capsule Take 1 capsule (40 mg total) by mouth daily. For Acid reflux / heartburn   fluticasone  (FLONASE ) 50 MCG/ACT nasal spray PLACE 2 SPRAYS INTO BOTH NOSTRILS DAILY. NASAL ALLERGY SYMPTOMS   furosemide  (LASIX ) 20 MG tablet TAKE 1 TABLET BY MOUTH AS NEEDED FOR FLUID OR EDEMA (SHORTNESS OF BREATH).   gabapentin  (NEURONTIN ) 100 MG capsule TAKE 1 CAPSULE (100 MG TOTAL) BY MOUTH AT BEDTIME. NERVE PAIN   hydrocortisone-pramoxine (ANALPRAM HC) 2.5-1 % rectal cream 1 appful rectally 3 times a day; Duration: 7 day(s)   levocetirizine (XYZAL ) 5 MG tablet TAKE 1 TABLET (5 MG TOTAL) BY MOUTH EVERY EVENING FOR ALLERGIES   metoprolol  tartrate (LOPRESSOR ) 25 MG tablet Take 1 tablet (25 mg total) by mouth daily. For  heart / blood pressure   montelukast  (SINGULAIR ) 10 MG tablet Take 1 tablet (10 mg total) by mouth at bedtime. For asthma / allergies   nitroGLYCERIN  (NITROSTAT ) 0.4 MG SL tablet Place 1 tablet (0.4 mg total) under the tongue every 5 (five) minutes x 3 doses as needed for chest pain. If chest pain is not relieved after 2nd dose - call 911.   pravastatin  (PRAVACHOL ) 80 MG tablet Take 1 tablet (80 mg total) by mouth every evening.   QVAR  REDIHALER 40 MCG/ACT inhaler INHALE 2 PUFFS INTO THE LUNGS 2 TIMES DAILY. (BREATHING)   sacubitril -valsartan  (ENTRESTO ) 49-51 MG TAKE 1 TABLET BY MOUTH 2 TIMES DAILY.   tamsulosin (FLOMAX) 0.4 MG CAPS capsule Take 1 capsule by mouth daily.   "

## 2024-08-28 ENCOUNTER — Ambulatory Visit: Admitting: Psychology

## 2024-09-11 ENCOUNTER — Ambulatory Visit: Admitting: Psychology

## 2024-09-25 ENCOUNTER — Ambulatory Visit: Admitting: Psychology

## 2024-09-26 ENCOUNTER — Ambulatory Visit

## 2024-10-09 ENCOUNTER — Ambulatory Visit: Admitting: Psychology

## 2024-10-23 ENCOUNTER — Ambulatory Visit: Admitting: Psychology

## 2024-11-06 ENCOUNTER — Ambulatory Visit: Admitting: Psychology

## 2024-11-20 ENCOUNTER — Ambulatory Visit: Admitting: Psychology

## 2024-11-21 ENCOUNTER — Ambulatory Visit: Admitting: Neurology

## 2024-12-04 ENCOUNTER — Ambulatory Visit: Admitting: Psychology

## 2025-01-09 ENCOUNTER — Ambulatory Visit
